# Patient Record
Sex: Female | Born: 1940 | Race: White | Hispanic: No | Marital: Single | Smoking: Former smoker
Health system: Southern US, Community
[De-identification: ages and names within clinical notes are randomized; demographics above are authoritative.]

## PROBLEM LIST (undated history)

## (undated) DIAGNOSIS — I482 Chronic atrial fibrillation, unspecified: Secondary | ICD-10-CM

## (undated) DIAGNOSIS — I5022 Chronic systolic (congestive) heart failure: Secondary | ICD-10-CM

## (undated) DIAGNOSIS — N183 Chronic kidney disease, stage 3 unspecified: Secondary | ICD-10-CM

## (undated) DIAGNOSIS — J189 Pneumonia, unspecified organism: Secondary | ICD-10-CM

## (undated) DIAGNOSIS — E119 Type 2 diabetes mellitus without complications: Secondary | ICD-10-CM

## (undated) DIAGNOSIS — I428 Other cardiomyopathies: Secondary | ICD-10-CM

## (undated) DIAGNOSIS — E0500 Thyrotoxicosis with diffuse goiter without thyrotoxic crisis or storm: Secondary | ICD-10-CM

## (undated) DIAGNOSIS — I119 Hypertensive heart disease without heart failure: Secondary | ICD-10-CM

## (undated) DIAGNOSIS — C9210 Chronic myeloid leukemia, BCR/ABL-positive, not having achieved remission: Secondary | ICD-10-CM

## (undated) DIAGNOSIS — I499 Cardiac arrhythmia, unspecified: Secondary | ICD-10-CM

## (undated) DIAGNOSIS — I4891 Unspecified atrial fibrillation: Secondary | ICD-10-CM

## (undated) DIAGNOSIS — I10 Essential (primary) hypertension: Secondary | ICD-10-CM

## (undated) DIAGNOSIS — E039 Hypothyroidism, unspecified: Secondary | ICD-10-CM

## (undated) DIAGNOSIS — D72829 Elevated white blood cell count, unspecified: Secondary | ICD-10-CM

## (undated) DIAGNOSIS — I739 Peripheral vascular disease, unspecified: Secondary | ICD-10-CM

## (undated) DIAGNOSIS — I701 Atherosclerosis of renal artery: Secondary | ICD-10-CM

## (undated) HISTORY — DX: Chronic atrial fibrillation, unspecified: I48.20

## (undated) HISTORY — DX: Chronic systolic (congestive) heart failure: I50.22

## (undated) HISTORY — DX: Chronic kidney disease, stage 3 unspecified: N18.30

## (undated) HISTORY — PX: HYSTEROTOMY: SHX1776

## (undated) HISTORY — DX: Hypertensive heart disease without heart failure: I11.9

## (undated) HISTORY — DX: Hypothyroidism, unspecified: E03.9

## (undated) HISTORY — DX: Type 2 diabetes mellitus without complications: E11.9

## (undated) HISTORY — DX: Chronic myeloid leukemia, BCR/ABL-positive, not having achieved remission: C92.10

## (undated) HISTORY — PX: CYST EXCISION: SHX5701

## (undated) HISTORY — DX: Thyrotoxicosis with diffuse goiter without thyrotoxic crisis or storm: E05.00

## (undated) HISTORY — DX: Essential (primary) hypertension: I10

## (undated) HISTORY — DX: Chronic kidney disease, stage 3 (moderate): N18.3

## (undated) HISTORY — PX: ABDOMINAL HYSTERECTOMY: SHX81

## (undated) HISTORY — PX: CYSTECTOMY: SUR359

## (undated) HISTORY — PX: CARDIOVERSION: SHX1299

## (undated) HISTORY — DX: Other cardiomyopathies: I42.8

---

## 2007-05-24 ENCOUNTER — Other Ambulatory Visit: Admission: RE | Admit: 2007-05-24 | Discharge: 2007-05-24 | Payer: Self-pay | Admitting: Family Medicine

## 2009-07-31 ENCOUNTER — Encounter: Admission: RE | Admit: 2009-07-31 | Discharge: 2009-07-31 | Payer: Self-pay | Admitting: Cardiology

## 2010-12-11 ENCOUNTER — Ambulatory Visit: Payer: Self-pay | Admitting: Cardiology

## 2011-04-08 ENCOUNTER — Encounter: Payer: Self-pay | Admitting: Cardiology

## 2011-04-23 ENCOUNTER — Telehealth: Payer: Self-pay | Admitting: Cardiology

## 2011-04-23 NOTE — Telephone Encounter (Signed)
WANTS TO SET UP APPOINTMENT SINCE DR. Doreatha Lew IS LEAVING. CAN SOMEONE CALL HER. DID NOT SEE CHART WITH WHOM DR. TENNANT IS REFERRING HER TOO.

## 2011-04-23 NOTE — Telephone Encounter (Signed)
Advised pt to see Sara Warner after tennants retirement

## 2011-12-15 ENCOUNTER — Institutional Professional Consult (permissible substitution): Payer: Self-pay | Admitting: Cardiology

## 2012-01-05 ENCOUNTER — Ambulatory Visit (INDEPENDENT_AMBULATORY_CARE_PROVIDER_SITE_OTHER): Payer: Medicare HMO | Admitting: Cardiology

## 2012-01-05 ENCOUNTER — Encounter: Payer: Self-pay | Admitting: Cardiology

## 2012-01-05 DIAGNOSIS — E039 Hypothyroidism, unspecified: Secondary | ICD-10-CM

## 2012-01-05 DIAGNOSIS — I4891 Unspecified atrial fibrillation: Secondary | ICD-10-CM

## 2012-01-05 DIAGNOSIS — I429 Cardiomyopathy, unspecified: Secondary | ICD-10-CM | POA: Insufficient documentation

## 2012-01-05 DIAGNOSIS — I428 Other cardiomyopathies: Secondary | ICD-10-CM

## 2012-01-05 NOTE — Progress Notes (Signed)
   HPI: 71 year old female previously followed by Dr. Doreatha Lew for followup of atrial fibrillation. She apparently has a permanent atrial fibrillation. She is not on Coumadin as she has had intermittent epistaxis with Coumadin in the past. Note a previous echocardiogram in 2008 showed an ejection fraction of 30-35%. Last echocardiogram in September of 2010 showed low normal LV function. There was mild left atrial enlargement. There was mild mitral regurgitation and tricuspid regurgitation. Myoview in February of 2009 showed no ischemia but mild global reduction in LV function with an ejection fraction of 47%. Since she was last seen in Jan 2012, the patient denies any dyspnea on exertion, orthopnea, PND, pedal edema, palpitations, syncope or chest pain.   Current Outpatient Prescriptions  Medication Sig Dispense Refill  . aspirin 325 MG EC tablet Take 325 mg by mouth daily.      . carvedilol (COREG) 6.25 MG tablet Take 6.25 mg by mouth 2 (two) times daily with a meal.      . clorazepate (TRANXENE) 7.5 MG tablet Take 7.5 mg by mouth daily as needed.      . digoxin (LANOXIN) 0.25 MG tablet Take 250 mcg by mouth daily.      . furosemide (LASIX) 20 MG tablet Take 20 mg by mouth daily.       . Levothyroxine Sodium (SYNTHROID PO) Take 125 mcg by mouth daily.       Marland Kitchen POTASSIUM PO Take 20 mcg by mouth daily.      . Calcium Carbonate (CALCIUM 600 PO) Take 600 mg by mouth 2 (two) times daily.      . Montelukast Sodium (SINGULAIR PO) Take by mouth daily.         Past Medical History  Diagnosis Date  . Arrhythmia     chronic atrial fib  . Toxic goiter   . Ejection fraction < 50%   . Hypothyroidism     Past Surgical History  Procedure Date  . Cystectomy   . Hysterotomy   . Cardioversion     History   Social History  . Marital Status: Single    Spouse Name: N/A    Number of Children: N/A  . Years of Education: N/A   Occupational History  . Not on file.   Social History Main Topics  .  Smoking status: Former Smoker    Quit date: 01/01/1984  . Smokeless tobacco: Not on file  . Alcohol Use:   . Drug Use:   . Sexually Active:    Other Topics Concern  . Not on file   Social History Narrative  . No narrative on file    ROS: no fevers or chills, productive cough, hemoptysis, dysphasia, odynophagia, melena, hematochezia, dysuria, hematuria, rash, seizure activity, orthopnea, PND, pedal edema, claudication. Remaining systems are negative.  Physical Exam: Well-developed well-nourished in no acute distress.  Skin is warm and dry.  HEENT is normal.  Neck is supple. No thyromegaly.  Chest is clear to auscultation with normal expansion.  Cardiovascular exam is irregular Abdominal exam nontender or distended. No masses palpated. Extremities show no edema. neuro grossly intact  ECG atrial fibrillation at a rate of 96. No significant ST changes.

## 2012-01-05 NOTE — Assessment & Plan Note (Addendum)
Continue Coreg and digoxin for rate control. Continue aspirin. She has embolic risk factors of female sex and age greater than 56. I reviewed Coumadin and reduced risk of CVA. However she has had nosebleeds with Coumadin in the past. For that reason she has declined that therapy. She understands risk of CVA. Will arrange 24 hour Holter monitor to make sure that rate is adequately controlled.

## 2012-01-05 NOTE — Assessment & Plan Note (Signed)
Management per primary care. 

## 2012-01-05 NOTE — Assessment & Plan Note (Signed)
Low normal to mildly reduced LV function on previous studies. Repeat echocardiogram.

## 2012-01-05 NOTE — Patient Instructions (Signed)
Your physician wants you to follow-up in: Sara Warner will receive a reminder letter in the mail two months in advance. If you don't receive a letter, please call our office to schedule the follow-up appointment.  Your physician has requested that you have an echocardiogram. Echocardiography is a painless test that uses sound waves to create images of your heart. It provides your doctor with information about the size and shape of your heart and how well your heart's chambers and valves are working. This procedure takes approximately one hour. There are no restrictions for this procedure.   Your physician has recommended that you wear a 24 HOUR holter monitor. Holter monitors are medical devices that record the heart's electrical activity. Doctors most often use these monitors to diagnose arrhythmias. Arrhythmias are problems with the speed or rhythm of the heartbeat. The monitor is a small, portable device. You can wear one while you do your normal daily activities. This is usually used to diagnose what is causing palpitations/syncope (passing out).

## 2012-01-12 ENCOUNTER — Encounter (INDEPENDENT_AMBULATORY_CARE_PROVIDER_SITE_OTHER): Payer: Medicare HMO

## 2012-01-12 ENCOUNTER — Other Ambulatory Visit: Payer: Self-pay

## 2012-01-12 ENCOUNTER — Ambulatory Visit (HOSPITAL_COMMUNITY): Payer: Medicare HMO | Attending: Cardiology | Admitting: Radiology

## 2012-01-12 DIAGNOSIS — I4891 Unspecified atrial fibrillation: Secondary | ICD-10-CM | POA: Insufficient documentation

## 2012-01-12 DIAGNOSIS — I428 Other cardiomyopathies: Secondary | ICD-10-CM | POA: Insufficient documentation

## 2012-01-12 DIAGNOSIS — E669 Obesity, unspecified: Secondary | ICD-10-CM | POA: Insufficient documentation

## 2012-01-15 ENCOUNTER — Telehealth: Payer: Self-pay | Admitting: *Deleted

## 2012-01-15 DIAGNOSIS — I4891 Unspecified atrial fibrillation: Secondary | ICD-10-CM

## 2012-01-15 MED ORDER — LISINOPRIL 2.5 MG PO TABS: 2.5000 mg | ORAL_TABLET | Freq: Every day | ORAL | Status: DC

## 2012-01-15 NOTE — Telephone Encounter (Signed)
Repeat labs:

## 2012-01-15 NOTE — Telephone Encounter (Signed)
Message copied by Cristopher Estimable on Fri Jan 15, 2012  4:29 PM ------      Message from: Lelon Perla      Created: Tue Jan 12, 2012  7:36 PM       Add lisinopril 2.5 mg po daily; bmet one week      Kirk Ruths

## 2012-01-22 ENCOUNTER — Telehealth: Payer: Self-pay | Admitting: *Deleted

## 2012-01-22 ENCOUNTER — Other Ambulatory Visit (INDEPENDENT_AMBULATORY_CARE_PROVIDER_SITE_OTHER): Payer: Medicare HMO

## 2012-01-22 DIAGNOSIS — I4891 Unspecified atrial fibrillation: Secondary | ICD-10-CM

## 2012-01-22 LAB — BASIC METABOLIC PANEL
BUN: 20 mg/dL (ref 6–23)
Calcium: 8.9 mg/dL (ref 8.4–10.5)
GFR: 73.22 mL/min (ref >60.00)
Glucose, Bld: 117 mg/dL — ABNORMAL HIGH (ref 70–99)

## 2012-01-22 MED ORDER — LISINOPRIL 2.5 MG PO TABS: 2.5000 mg | ORAL_TABLET | Freq: Every day | ORAL | Status: DC

## 2012-01-22 MED ORDER — CARVEDILOL 12.5 MG PO TABS: 12.5000 mg | ORAL_TABLET | Freq: Two times a day (BID) | ORAL | Status: DC

## 2012-01-22 NOTE — Telephone Encounter (Signed)
Spoke with pt, aware monitor reviewed by dr Stanford Breed shows a fib with PVC's or aberrantly conducted beats, rate increased. Pt instructed to inxcrease carvedilol to 12.5 mg bid.

## 2012-02-16 ENCOUNTER — Telehealth: Payer: Self-pay | Admitting: Cardiology

## 2012-02-16 DIAGNOSIS — I10 Essential (primary) hypertension: Secondary | ICD-10-CM

## 2012-02-16 NOTE — Telephone Encounter (Signed)
Spoke with pt, Aware of dr crenshaw's recommendations.  °

## 2012-02-16 NOTE — Telephone Encounter (Signed)
Dc lisinopril; check bmet Kirk Ruths

## 2012-02-16 NOTE — Telephone Encounter (Signed)
Spoke with pt, she is having a dry cough, muscle weakness and dark urine that she feels is coming from the lisinopril. She would like to change to a different medicine. Will forward for dr Stanford Breed review

## 2012-02-16 NOTE — Telephone Encounter (Signed)
New msg Pt said she has been having side effects from lisinopril. She wants to change back to lopressor. Please call

## 2012-02-17 ENCOUNTER — Other Ambulatory Visit (INDEPENDENT_AMBULATORY_CARE_PROVIDER_SITE_OTHER): Payer: Medicare HMO

## 2012-02-17 DIAGNOSIS — I10 Essential (primary) hypertension: Secondary | ICD-10-CM

## 2012-02-17 LAB — BASIC METABOLIC PANEL
Calcium: 8.9 mg/dL (ref 8.4–10.5)
Creatinine, Ser: 0.8 mg/dL (ref 0.4–1.2)
GFR: 76.42 mL/min (ref >60.00)
Sodium: 138 mEq/L (ref 135–145)

## 2012-05-12 ENCOUNTER — Telehealth: Payer: Self-pay | Admitting: *Deleted

## 2012-05-12 NOTE — Telephone Encounter (Signed)
New Pt Triage---New pt referral for elevated WBC with immature cells. Persistently elevating over past year, now up to 26K. Orders to scheduling.

## 2012-05-16 ENCOUNTER — Telehealth: Payer: Self-pay | Admitting: *Deleted

## 2012-05-16 NOTE — Telephone Encounter (Signed)
Called patient at [phone] patient confirmed over the phone the new date and time on 05-24-2012 starting at 10:00am with financial counseling

## 2012-05-23 ENCOUNTER — Other Ambulatory Visit: Payer: Self-pay | Admitting: Oncology

## 2012-05-23 ENCOUNTER — Telehealth: Payer: Self-pay | Admitting: Oncology

## 2012-05-23 DIAGNOSIS — D72829 Elevated white blood cell count, unspecified: Secondary | ICD-10-CM

## 2012-05-23 NOTE — Telephone Encounter (Signed)
Referred by Cyndi Bender, PA Dx- Elevated WBC

## 2012-05-24 ENCOUNTER — Other Ambulatory Visit (HOSPITAL_COMMUNITY)
Admission: RE | Admit: 2012-05-24 | Discharge: 2012-05-24 | Disposition: A | Discharge status: Home / Self Care | Payer: Medicare HMO | Source: Ambulatory Visit | Attending: Oncology | Admitting: Oncology

## 2012-05-24 ENCOUNTER — Other Ambulatory Visit (HOSPITAL_BASED_OUTPATIENT_CLINIC_OR_DEPARTMENT_OTHER): Payer: Medicare HMO | Admitting: Lab

## 2012-05-24 ENCOUNTER — Ambulatory Visit (HOSPITAL_BASED_OUTPATIENT_CLINIC_OR_DEPARTMENT_OTHER): Payer: Medicare HMO | Admitting: Oncology

## 2012-05-24 ENCOUNTER — Ambulatory Visit: Payer: Medicare HMO

## 2012-05-24 ENCOUNTER — Telehealth: Payer: Self-pay | Admitting: Oncology

## 2012-05-24 VITALS — BP 94/55 | HR 57 | Temp 97.0°F | Ht 68.0 in | Wt 216.3 lb

## 2012-05-24 DIAGNOSIS — D72829 Elevated white blood cell count, unspecified: Secondary | ICD-10-CM

## 2012-05-24 DIAGNOSIS — I4891 Unspecified atrial fibrillation: Secondary | ICD-10-CM

## 2012-05-24 LAB — CBC WITH DIFFERENTIAL/PLATELET
Eosinophils Absolute: 0.7 10*3/uL — ABNORMAL HIGH (ref 0.0–0.5)
HGB: 13.5 g/dL (ref 11.6–15.9)
MCV: 90.1 fL (ref 79.5–101.0)
MONO#: 0.6 10*3/uL (ref 0.1–0.9)
MONO%: 2 % (ref 0.0–14.0)
NEUT#: 21 10*3/uL — ABNORMAL HIGH (ref 1.5–6.5)
RBC: 4.68 10*6/uL (ref 3.70–5.45)
RDW: 14.5 % (ref 11.2–14.5)
WBC: 27.9 10*3/uL — ABNORMAL HIGH (ref 3.9–10.3)
lymph#: 4.3 10*3/uL — ABNORMAL HIGH (ref 0.9–3.3)

## 2012-05-24 LAB — COMPREHENSIVE METABOLIC PANEL
Albumin: 4.6 g/dL (ref 3.5–5.2)
Alkaline Phosphatase: 38 U/L — ABNORMAL LOW (ref 39–117)
Calcium: 9.4 mg/dL (ref 8.4–10.5)
Chloride: 104 mEq/L (ref 96–112)
Glucose, Bld: 124 mg/dL — ABNORMAL HIGH (ref 70–99)
Potassium: 4.7 mEq/L (ref 3.5–5.3)
Sodium: 139 mEq/L (ref 135–145)
Total Protein: 6.5 g/dL (ref 6.0–8.3)

## 2012-05-24 NOTE — Progress Notes (Signed)
Note dictated

## 2012-05-24 NOTE — Progress Notes (Signed)
CC:   Daiva Eves, MD  REASON FOR CONSULTATION:  Leukocytosis.  HISTORY OF PRESENT ILLNESS:  This is a pleasant 71 year old woman, native of [place], currently lives in [place], [place].  She is a pleasant woman with a past medical history significant for hypothyroidism and atrial fibrillation, followed by Dr. Doreatha Lew regarding that.  She gets her routine medical care as mentioned at [facility] and she has been noted to have leukocytosis on her last CBC.  On June 12 she had a white cell count of 25,000, hemoglobin was 14, platelet count was elevated at 542.  The differential showed elevated eosinophils and basophils but short on neutrophils predominantly and a left shift.  The differential showed some metamyelocytes and myelocytes as well.  Previously she had a CBC done in October of 2012, showed her white cell count was 12.3.  She had normal platelets and some absolute monocytosis and neutrophilia but otherwise really normal differential.  In May of 2012 she had a completely normal CBC.  For that reason, the patient was referred to me for evaluation for leukocytosis and thrombocytosis as well.  Clinically she is asymptomatic from this.  She does not report any fevers, does not report any chills, does not report any lymphadenopathy, has not had any weight loss, has not had any deterioration in her performance status.  Has not had any constitutional symptoms.  She does not report any back pain or bony pain.  She has not really reported any major changes in her activity level.  She does have some weak spells as she describes it, some fatigue at times but she still lives independently although she has a lot of family members that check on her on a regular basis.  She is able to drive and again attend to most activities of daily living without any hindrance or decline.  REVIEW OF SYSTEMS:  Not reporting headaches, blurry vision, double vision.  Not reporting any  motor or sensory neuropathy.  Not reporting any alteration of mental status.  Not reporting any psychiatric issues, depression.  Not reporting any fever, chills, sweats.  Not reporting any cough, hemoptysis, hematemesis, nausea, vomiting.  Not reporting any abdominal pain, hematochezia, melena or genitourinary complaints.  The rest of the review of systems is unremarkable.  PAST MEDICAL HISTORY:  Significant for atrial fibrillation.  She is rate controlled.  She has been on Coumadin in the past, developed epistaxis and most recently was prescribed Pradaxa which she has not started yet. She has also history of hypothyroidism, history of allergic rhinitis, hypertension, anxiety and peripheral neuropathy.  MEDICATIONS:  She is on Synthroid, carvedilol, clorazepate, Lasix, digoxin, multivitamin, calcium and aspirin.  SOCIAL HISTORY:  She is widowed.  She has 5 children, a number of grandchildren and great grandchildren.  Denied any alcohol or tobacco abuse for the time being.  She was working in [employer]. Her husband owned a Engineer, technical sales.  FAMILY HISTORY:  Her father is still alive.  He is 73 years old.  He has history of prostate cancer, had also lung cancer with resection, still reasonably in okay health.  Mother deceased, she had colon cancer. Otherwise no other history of any leukemias or blood disorders.  PHYSICAL EXAMINATION:  General:  Alert, awake, pleasant woman appeared in no active distress.  Vital signs:  Her blood pressure today is 94/55, pulse 57, respiration 20, temperature 97.  She weighs 216 pounds.  ECOG performance status is 1.  HEENT:  Head is normocephalic,  atraumatic. Pupils equal, round, reactive to light.  Oral mucosa moist and pink. Neck:  Supple without adenopathy.   Heart: irregular rhythm, controlled rate. No  murmurs, rubs, or gallops. Lungs: Clear to auscultation. No rhonchi,  wheezes, or dullness to percussion. Abdomen: Soft, nontender. No   hepatosplenomegaly. Extremities: No edema.  LABORATORY DATA: Hemoglobin of 13, white cell count of 27.9, platelet  count of 449. Differential showed 75% neutrophils. Basophils were  noted. Absolute basophilia and eosinophilia noted. Peripheral smear  was personally reviewed today and showed he had a left shift on the  smear including neutrophils, basophils, monocytes, metamyelocytes, very  few large or immature cells noted.  ASSESSMENT AND PLAN: 71 year old woman with the following issues.  1. Leukocytosis. Differential diagnosis discussed today with Mrs.  Costley which was divided today into a reactive versus a primary.  Reactive leukocytosis could be related to conditions such as  infection, stress, recent hospitalization and illnesses, medication  allergies. All of that, I think, are less likely in Mrs.  Renne. Really the history and the story, and the pattern of  her white cell elevation is not consistent with that. Primary  causes were discussed today in detail with Mrs. Ramesh. Those  would be related to a lymphoproliferative or a myeloproliferative  disorder. I think I favor here a myeloproliferative disorder in  the form of possibly a BCR-ABL positive chronic myelogenous  leukemia (CML); I think the most likely diagnosis. However, she  could have also a myeloproliferative disorder that is BCR-ABL  negative such as a myelofibrosis, essential thrombocythemia.  Polycythemia, I think, is less likely. I think she had a CEA and  normal hemoglobin at this time. I did also note some immature  white cells. However, I doubt that we are dealing with a  lymphoproliferative disorder and a leukemic process. To work this  up, I sent her peripheral blood for BCR-ABL to confirm the most  likely diagnosis of CML, but also I have sent her peripheral blood  for flow cytometry to rule out any lymphoproliferative disorder,  also to get an idea of her blast count to make sure she does not    have any acute process. I doubt that she has acute leukemia at  this point. The story and the peripheral smear all go against it.  Once we have established the diagnosis, I have asked her to come  back and we will discuss the treatment if indeed we are dealing  with CML. I talked to her briefly about the treatment options  including oral agents if she has in a BCR-ABL negative  myeloproliferative disorder and then we will discuss the treatment  options at that time. I also talked about the possible need for  bone marrow biopsy depending on the findings of the peripheral  blood analysis. All her questions were answered today. I think  overall she carries a good prognosis at this time as I do not  really see enlarged blasts in her peripheral smear to indicate  acute leukemic process. She is asymptomatic at this point and will  continue to be asymptomatic.  2. Atrial fibrillation. She is rate controlled with carvedilol. She  had been recently prescribed Pradaxa. I do not really see any  contraindication from my standpoint starting that.              ______________________________ Wyatt Portela, M.D. FNS/MEDQ  D:  05/24/2012  T:  05/24/2012  Job:  [id number]

## 2012-05-24 NOTE — Telephone Encounter (Signed)
gv pt appt schedule for July.  

## 2012-05-25 LAB — FLOW CYTOMETRY

## 2012-06-08 ENCOUNTER — Telehealth: Payer: Self-pay | Admitting: Oncology

## 2012-06-08 ENCOUNTER — Ambulatory Visit (HOSPITAL_BASED_OUTPATIENT_CLINIC_OR_DEPARTMENT_OTHER): Payer: Medicare HMO | Admitting: Oncology

## 2012-06-08 VITALS — BP 119/63 | HR 73 | Ht 68.0 in | Wt 215.5 lb

## 2012-06-08 DIAGNOSIS — D72829 Elevated white blood cell count, unspecified: Secondary | ICD-10-CM

## 2012-06-08 NOTE — Progress Notes (Signed)
Hematology and Oncology Follow Up Visit  Sara Warner [id number] 11-Sep-1941 71 y.o. 06/08/2012 4:26 PM   Principle Diagnosis: 71 year old woman with the leukocytosis. Differential diagnosis include: Reactive leukocytosis vs myeloproliferative disorder. Her BCR-ABL is negative.    Interim History:  Sara Warner presents today for a follow up visit. She is 71 year old with the above diagnosis presents today after I saw her for the first time on 05/24/2012. She is doing well and has no complaints. Clinically she is asymptomatic she does not report any fevers, does not report any chills,  does not report any lymphadenopathy, has not had any weight loss, has not had any deterioration in her performance status. Has not had any constitutional symptoms. She does not report any back pain or bony pain. She has not really reported any major changes in her activity level. She is able to drive and again attend to most activities of daily living without any hindrance or decline.   Medications: I have reviewed the patient's current medications. Current outpatient prescriptions:aspirin 325 MG EC tablet, Take 325 mg by mouth daily., Disp: , Rfl: ;  Calcium Carbonate (CALCIUM 600 PO), Take 600 mg by mouth 2 (two) times daily., Disp: , Rfl: ;  carvedilol (COREG) 12.5 MG tablet, Take 1 tablet (12.5 mg total) by mouth 2 (two) times daily with a meal., Disp: 180 tablet, Rfl: 4;  clorazepate (TRANXENE) 7.5 MG tablet, Take 7.5 mg by mouth daily as needed., Disp: , Rfl:  digoxin (LANOXIN) 0.25 MG tablet, Take 250 mcg by mouth daily., Disp: , Rfl: ;  furosemide (LASIX) 20 MG tablet, Take 20 mg by mouth daily. , Disp: , Rfl: ;  Levothyroxine Sodium (SYNTHROID PO), Take 125 mcg by mouth daily. , Disp: , Rfl: ;  POTASSIUM PO, Take 20 mcg by mouth daily., Disp: , Rfl: ;  DISCONTD: lisinopril (ZESTRIL) 2.5 MG tablet, Take 1 tablet (2.5 mg total) by mouth daily., Disp: 90 tablet, Rfl: 4  Allergies: No Known Allergies  Past  Medical History, Surgical history, Social history, and Family History were reviewed and updated.  Review of Systems: Constitutional:  Negative for fever, chills, night sweats, anorexia, weight loss, pain. Cardiovascular: no chest pain or dyspnea on exertion Respiratory: negative Neurological: negative Dermatological: negative ENT: negative Skin: Negative. Gastrointestinal: negative Genito-Urinary: negative Hematological and Lymphatic: negative Breast: negative Musculoskeletal: negative Remaining ROS negative. Physical Exam: Blood pressure 119/63, pulse 73, height 5\' 8"  (1.727 m), weight 215 lb 8 oz (97.75 kg). ECOG: 1 General appearance: alert Head: Normocephalic, without obvious abnormality, atraumatic Neck: no adenopathy, no carotid bruit, no JVD, supple, symmetrical, trachea midline and thyroid not enlarged, symmetric, no tenderness/mass/nodules Lymph nodes: Cervical, supraclavicular, and axillary nodes normal. Heart:irregularly irregular rhythm Lung:chest clear, no wheezing, rales, normal symmetric air entry Abdomin: soft, non-tender, without masses or organomegaly EXT:no erythema, induration, or nodules   Lab Results: Lab Results  Component Value Date   WBC 27.9* 05/24/2012   HGB 13.5 05/24/2012   HCT 42.2 05/24/2012   MCV 90.1 05/24/2012   PLT 449* 05/24/2012     Chemistry      Component Value Date/Time   NA 139 05/24/2012 1004   K 4.7 05/24/2012 1004   CL 104 05/24/2012 1004   CO2 26 05/24/2012 1004   BUN 17 05/24/2012 1004   CREATININE 0.82 05/24/2012 1004      Component Value Date/Time   CALCIUM 9.4 05/24/2012 1004   ALKPHOS 38* 05/24/2012 1004   AST 15 05/24/2012 1004   ALT 19  05/24/2012 1004   BILITOT 0.5 05/24/2012 1004        Impression and Plan:   This is a pleasant 71 year old Leukocytosis with the differential diagnosis include myeloproliferative disorder which include essential thrombocytosis, myelofibrosis and BCR-ABL negative CML.  I doubt this is a  lymphoproliferate disorder with a negative flow cytometry.  The plan is to observe her for a short period of time and if her counts continue to go up, I will do a bone marrow biopsy and consider starting hydroxyurea if indeed she has MPD.  I will also check her JAK-2 mutation with next lab draw.  For the time being, she is asymptomatic from this and her risk of thrombosis is very low.       Zola Button, MD 7/10/20134:26 PM

## 2012-06-08 NOTE — Telephone Encounter (Signed)
gv pt appt schedule for October.  °

## 2012-08-12 ENCOUNTER — Telehealth: Payer: Self-pay | Admitting: Cardiology

## 2012-08-12 ENCOUNTER — Other Ambulatory Visit: Payer: Self-pay | Admitting: Cardiology

## 2012-08-12 MED ORDER — CARVEDILOL 12.5 MG PO TABS: 12.5000 mg | ORAL_TABLET | Freq: Two times a day (BID) | ORAL | Status: DC

## 2012-08-12 MED ORDER — DIGOXIN 250 MCG PO TABS: 250.0000 ug | ORAL_TABLET | Freq: Every day | ORAL | Status: DC

## 2012-08-12 NOTE — Telephone Encounter (Signed)
Pt would like some called into [facility] in [place] so she can have enough til it comes from [facility] and she also needs that new Rx called in to [facility] asap

## 2012-08-12 NOTE — Telephone Encounter (Signed)
Spoke with pt, refills for coreg and digoxin sent to pharm

## 2012-08-12 NOTE — Telephone Encounter (Signed)
[facility] has changed the dose on her digoxin and she wants to discuss this

## 2012-08-15 ENCOUNTER — Telehealth: Payer: Self-pay | Admitting: Cardiology

## 2012-08-15 MED ORDER — CARVEDILOL 12.5 MG PO TABS: 12.5000 mg | ORAL_TABLET | Freq: Two times a day (BID) | ORAL | Status: DC

## 2012-08-15 NOTE — Telephone Encounter (Signed)
Pt needs 1 week's worth  refill at [facility] for  carvedilol , pt out and needs enough to last until mail order gets here

## 2012-09-07 ENCOUNTER — Other Ambulatory Visit: Payer: Medicare HMO | Admitting: Lab

## 2012-09-07 ENCOUNTER — Ambulatory Visit: Payer: Medicare HMO | Admitting: Oncology

## 2012-09-23 ENCOUNTER — Telehealth: Payer: Self-pay | Admitting: *Deleted

## 2012-09-23 NOTE — Telephone Encounter (Signed)
Gave patient appointment 10-11-2012 starting at 11:30am

## 2012-10-11 ENCOUNTER — Other Ambulatory Visit (HOSPITAL_BASED_OUTPATIENT_CLINIC_OR_DEPARTMENT_OTHER): Payer: Medicare HMO | Admitting: Lab

## 2012-10-11 ENCOUNTER — Ambulatory Visit (HOSPITAL_BASED_OUTPATIENT_CLINIC_OR_DEPARTMENT_OTHER): Payer: Medicare HMO | Admitting: Oncology

## 2012-10-11 ENCOUNTER — Telehealth: Payer: Self-pay | Admitting: Oncology

## 2012-10-11 VITALS — BP 109/62 | HR 91 | Temp 98.3°F | Resp 20 | Ht 68.0 in | Wt 218.7 lb

## 2012-10-11 DIAGNOSIS — D72829 Elevated white blood cell count, unspecified: Secondary | ICD-10-CM

## 2012-10-11 LAB — CBC WITH DIFFERENTIAL/PLATELET
BASO%: 0.3 % (ref 0.0–2.0)
HCT: 39.8 % (ref 34.8–46.6)
LYMPH%: 9.9 % — ABNORMAL LOW (ref 14.0–49.7)
MCHC: 33.3 g/dL (ref 31.5–36.0)
MONO#: 1.5 10*3/uL — ABNORMAL HIGH (ref 0.1–0.9)
NEUT%: 85.3 % — ABNORMAL HIGH (ref 38.4–76.8)
Platelets: 402 10*3/uL — ABNORMAL HIGH (ref 145–400)
WBC: 53.5 10*3/uL (ref 3.9–10.3)

## 2012-10-11 LAB — COMPREHENSIVE METABOLIC PANEL (CC13)
AST: 26 U/L (ref 5–34)
BUN: 17 mg/dL (ref 7.0–26.0)
CO2: 25 mEq/L (ref 22–29)
Calcium: 9.6 mg/dL (ref 8.4–10.4)
Chloride: 105 mEq/L (ref 98–107)
Creatinine: 0.9 mg/dL (ref 0.6–1.1)

## 2012-10-11 LAB — TECHNOLOGIST REVIEW

## 2012-10-11 NOTE — Telephone Encounter (Signed)
Gave pt appt for MD only, pt will be called for the CT Biopsy by Radiology

## 2012-10-11 NOTE — Progress Notes (Signed)
Hematology and Oncology Follow Up Visit  Genea Matyas [id number] October 03, 1941 71 y.o. 10/11/2012 12:17 PM   Principle Diagnosis: 71 year old woman with the leukocytosis. Differential diagnosis include: Reactive leukocytosis vs myeloproliferative disorder. Her BCR-ABL is negative.   Interim History:  Mrs.Liotta presents today for a follow up visit. She is 71 year old with the above diagnosis presents today after I saw her for the first time on 05/24/2012. She is doing well and has no complaints. Clinically she is asymptomatic she does not report any fevers, does not report any chills,  does not report any lymphadenopathy, has not had any weight loss, has not had any deterioration in her performance status. Has not had any constitutional symptoms. She does not report any back pain or bony pain. She has not really reported any major changes in her activity level. She is able to drive and again attend to most activities of daily living without any hindrance or decline.   Medications: I have reviewed the patient's current medications. Current outpatient prescriptions:aspirin 325 MG EC tablet, Take 325 mg by mouth daily., Disp: , Rfl: ;  Calcium Carbonate (CALCIUM 600 PO), Take 600 mg by mouth 2 (two) times daily., Disp: , Rfl: ;  carvedilol (COREG) 12.5 MG tablet, Take 1 tablet (12.5 mg total) by mouth 2 (two) times daily with a meal., Disp: 60 tablet, Rfl: 1 diazepam (VALIUM) 5 MG tablet, Take 5 mg by mouth every 6 (six) hours as needed. Patient takes 1/2 tablet as needed, Disp: , Rfl: ;  digoxin (LANOXIN) 0.25 MG tablet, Take 1 tablet (250 mcg total) by mouth daily., Disp: 90 tablet, Rfl: 4;  furosemide (LASIX) 20 MG tablet, Take 20 mg by mouth daily. , Disp: , Rfl: ;  Levothyroxine Sodium (SYNTHROID PO), Take 125 mcg by mouth daily. , Disp: , Rfl:  POTASSIUM PO, Take 20 mcg by mouth daily., Disp: , Rfl: ;  [DISCONTINUED] lisinopril (ZESTRIL) 2.5 MG tablet, Take 1 tablet (2.5 mg total) by mouth  daily., Disp: 90 tablet, Rfl: 4  Allergies: No Known Allergies  Past Medical History, Surgical history, Social history, and Family History were reviewed and updated.  Review of Systems: Constitutional:  Negative for fever, chills, night sweats, anorexia, weight loss, pain. Cardiovascular: no chest pain or dyspnea on exertion Respiratory: negative Neurological: negative Dermatological: negative ENT: negative Skin: Negative. Gastrointestinal: negative Genito-Urinary: negative Hematological and Lymphatic: negative Breast: negative Musculoskeletal: negative Remaining ROS negative. Physical Exam: Blood pressure 109/62, pulse 91, temperature 98.3 F (36.8 C), temperature source Oral, resp. rate 20, height 5\' 8"  (1.727 m), weight 218 lb 11.2 oz (99.202 kg). ECOG: 1 General appearance: alert Head: Normocephalic, without obvious abnormality, atraumatic Neck: no adenopathy, no carotid bruit, no JVD, supple, symmetrical, trachea midline and thyroid not enlarged, symmetric, no tenderness/mass/nodules Lymph nodes: Cervical, supraclavicular, and axillary nodes normal. Heart:irregularly irregular rhythm Lung:chest clear, no wheezing, rales, normal symmetric air entry Abdomin: soft, non-tender, without masses or organomegaly EXT:no erythema, induration, or nodules   Lab Results: Lab Results  Component Value Date   WBC 53.5* 10/11/2012   HGB 13.3 10/11/2012   HCT 39.8 10/11/2012   MCV 91.6 10/11/2012   PLT 402* 10/11/2012     Chemistry      Component Value Date/Time   NA 139 05/24/2012 1004   K 4.7 05/24/2012 1004   CL 104 05/24/2012 1004   CO2 26 05/24/2012 1004   BUN 17 05/24/2012 1004   CREATININE 0.82 05/24/2012 1004      Component Value Date/Time  CALCIUM 9.4 05/24/2012 1004   ALKPHOS 38* 05/24/2012 1004   AST 15 05/24/2012 1004   ALT 19 05/24/2012 1004   BILITOT 0.5 05/24/2012 1004     Smear reviewed: increased myelocytes and metamyelocytes. No blasts.  Impression and Plan:     This is a pleasant 71 year old Leukocytosis with the differential diagnosis include myeloproliferative disorder which include essential thrombocytosis, myelofibrosis and BCR-ABL negative CML.  I doubt this is a lymphoproliferate disorder with a negative flow cytometry.  Given her continued increase in her WBC, the plan is to do a bone marrow biopsy to rule out myeloproliferative disorder (BCR ABL negative CML, etc.). I will see her back after her biopsy.        [id number], MD 11/12/201312:17 PM

## 2012-10-20 ENCOUNTER — Other Ambulatory Visit: Payer: Self-pay | Admitting: Radiology

## 2012-10-24 ENCOUNTER — Encounter (HOSPITAL_COMMUNITY): Payer: Self-pay

## 2012-10-24 ENCOUNTER — Ambulatory Visit (HOSPITAL_COMMUNITY)
Admission: RE | Admit: 2012-10-24 | Discharge: 2012-10-24 | Disposition: A | Discharge status: Home / Self Care | Payer: Medicare HMO | Source: Ambulatory Visit | Attending: Oncology | Admitting: Oncology

## 2012-10-24 DIAGNOSIS — D72829 Elevated white blood cell count, unspecified: Secondary | ICD-10-CM

## 2012-10-24 HISTORY — DX: Elevated white blood cell count, unspecified: D72.829

## 2012-10-24 HISTORY — DX: Unspecified atrial fibrillation: I48.91

## 2012-10-24 LAB — CBC
HCT: 42.1 % (ref 36.0–46.0)
Hemoglobin: 13.9 g/dL (ref 12.0–15.0)
MCH: 30.2 pg (ref 26.0–34.0)
MCV: 91.3 fL (ref 78.0–100.0)
RBC: 4.61 MIL/uL (ref 3.87–5.11)

## 2012-10-24 LAB — PROTIME-INR: Prothrombin Time: 14 seconds (ref 11.6–15.2)

## 2012-10-24 MED ORDER — FENTANYL CITRATE 0.05 MG/ML IJ SOLN
INTRAMUSCULAR | Status: AC | PRN
  Administered 2012-10-24: 100 ug via INTRAVENOUS
  Administered 2012-10-24: 50 ug via INTRAVENOUS

## 2012-10-24 MED ORDER — MIDAZOLAM HCL 5 MG/5ML IJ SOLN
INTRAMUSCULAR | Status: AC | PRN
Start: 2012-10-24 — End: 2012-10-24
  Administered 2012-10-24: 1 mg via INTRAVENOUS

## 2012-10-24 MED ORDER — FENTANYL CITRATE 0.05 MG/ML IJ SOLN
INTRAMUSCULAR | Status: AC
  Filled 2012-10-24: qty 6

## 2012-10-24 MED ORDER — MIDAZOLAM HCL 2 MG/2ML IJ SOLN
INTRAMUSCULAR | Status: AC | PRN
  Administered 2012-10-24: 2 mg via INTRAVENOUS

## 2012-10-24 MED ORDER — MIDAZOLAM HCL 2 MG/2ML IJ SOLN
INTRAMUSCULAR | Status: AC
  Filled 2012-10-24: qty 6

## 2012-10-24 MED ORDER — SODIUM CHLORIDE 0.9 % IV SOLN
INTRAVENOUS | Status: DC
  Administered 2012-10-24: 08:00:00 via INTRAVENOUS

## 2012-10-24 NOTE — H&P (Signed)
Chief Complaint: "I'm here for a bone marrow biopsy" Referring Physician:Shadad HPI: Sara Warner is an 71 y.o. female with newly found leukocytosis of uncertain etiology. She is referred and scheduled for bone marrow biopsy. PMHx and meds reviewed.   Past Medical History:  Past Medical History  Diagnosis Date  . Arrhythmia     chronic atrial fib  . Toxic goiter   . Ejection fraction < 50%   . Hypothyroidism   . Atrial fibrillation   . CHF (congestive heart failure)   . Elevated WBC count     Past Surgical History:  Past Surgical History  Procedure Date  . Cystectomy   . Hysterotomy   . Cardioversion     Family History: No family history on file.  Social History:  reports that she quit smoking about 28 years ago. She does not have any smokeless tobacco history on file. Her alcohol and drug histories not on file.  Allergies: No Known Allergies  Medications: Current outpatient prescriptions:aspirin 325 MG EC tablet, Take 325 mg by mouth daily., Disp: , Rfl: ; Calcium Carbonate (CALCIUM 600 PO), Take 600 mg by mouth 2 (two) times daily., Disp: , Rfl: ; carvedilol (COREG) 12.5 MG tablet, Take 1 tablet (12.5 mg total) by mouth 2 (two) times daily with a meal., Disp: 60 tablet, Rfl: 1  diazepam (VALIUM) 5 MG tablet, Take 5 mg by mouth every 6 (six) hours as needed. Patient takes 1/2 tablet as needed, Disp: , Rfl: ; digoxin (LANOXIN) 0.25 MG tablet, Take 1 tablet (250 mcg total) by mouth daily., Disp: 90 tablet, Rfl: 4; furosemide (LASIX) 20 MG tablet, Take 20 mg by mouth daily. , Disp: , Rfl: ; Levothyroxine Sodium (SYNTHROID PO), Take 125 mcg by mouth daily. , Disp: , Rfl:  POTASSIUM PO, Take 20 mcg by mouth daily., Disp   Please HPI for pertinent positives, otherwise complete 10 system ROS negative.  Physical Exam: Blood pressure 139/71, pulse 82, temperature 98.1 F (36.7 C), resp. rate 18, height 5\' 8"  (1.727 m), weight 215 lb (97.523 kg), SpO2 98.00%. Body mass index is  32.69 kg/(m^2).   General Appearance:  Alert, cooperative, no distress, appears stated age  Head:  Normocephalic, without obvious abnormality, atraumatic  ENT: Unremarkable  Neck: Supple, symmetrical, trachea midline, no adenopathy, thyroid: not enlarged, symmetric, no tenderness/mass/nodules  Lungs:   Clear to auscultation bilaterally, no w/r/r, respirations unlabored without use of accessory muscles.  Chest Wall:  No tenderness or deformity  Heart:  Irregularly irregular c/w afib  Neurologic: Normal affect, no gross deficits.   Results for orders placed during the hospital encounter of 10/24/12 (from the past 48 hour(s))  APTT     Status: Normal   Collection Time   10/24/12  7:55 AM      Component Value Range Comment   aPTT 30  24 - 37 seconds   CBC     Status: Abnormal   Collection Time   10/24/12  7:55 AM      Component Value Range Comment   WBC 50.6 (*) 4.0 - 10.5 K/uL    RBC 4.61  3.87 - 5.11 MIL/uL    Hemoglobin 13.9  12.0 - 15.0 g/dL    HCT 42.1  36.0 - 46.0 %    MCV 91.3  78.0 - 100.0 fL    MCH 30.2  26.0 - 34.0 pg    MCHC 33.0  30.0 - 36.0 g/dL    RDW 15.7 (*) 11.5 - 15.5 %  Platelets 420 (*) 150 - 400 K/uL   PROTIME-INR     Status: Normal   Collection Time   10/24/12  7:55 AM      Component Value Range Comment   Prothrombin Time 14.0  11.6 - 15.2 seconds    INR 1.09  0.00 - 1.49    No results found.  Assessment/Plan Leukocytosis CT BM biopsy today Discussed procedure and risks Labs reviewed Consent signed in chart  Ascencion Dike PA-C 10/24/2012, 8:23 AM

## 2012-10-24 NOTE — Procedures (Signed)
Technically successful CT guided bone marrow aspiration and biopsy of left iliac crest. No immediate complications. Awaiting pathology report.    

## 2012-10-26 ENCOUNTER — Ambulatory Visit: Payer: Medicare HMO | Admitting: Oncology

## 2012-11-04 ENCOUNTER — Ambulatory Visit (HOSPITAL_BASED_OUTPATIENT_CLINIC_OR_DEPARTMENT_OTHER): Payer: Medicare HMO | Admitting: Oncology

## 2012-11-04 ENCOUNTER — Telehealth: Payer: Self-pay | Admitting: Oncology

## 2012-11-04 VITALS — BP 98/57 | HR 75 | Temp 97.2°F | Resp 20 | Ht 68.0 in | Wt 215.1 lb

## 2012-11-04 DIAGNOSIS — C9210 Chronic myeloid leukemia, BCR/ABL-positive, not having achieved remission: Secondary | ICD-10-CM

## 2012-11-04 DIAGNOSIS — D72829 Elevated white blood cell count, unspecified: Secondary | ICD-10-CM

## 2012-11-04 MED ORDER — IMATINIB MESYLATE 400 MG PO TABS: 400.0000 mg | ORAL_TABLET | Freq: Every day | ORAL | Status: DC

## 2012-11-04 NOTE — Progress Notes (Signed)
Script given to managed care for financial assistance for gleevec

## 2012-11-04 NOTE — Telephone Encounter (Signed)
gv and printed appt schedule for pt for jan 2014

## 2012-11-04 NOTE — Progress Notes (Signed)
Hematology and Oncology Follow Up Visit  Sara Warner [id number] May 07, 1941 70 y.o. 11/04/2012 12:51 PM   Principle Diagnosis: 70 year old woman with the leukocytosis. Differential diagnosis include myeloproliferative disorder. Her BCR-ABL is negative in the blood.   Interim History:  Sara Warner presents today for a follow up visit. She is 71 year old with the above diagnosis presents today after she completed a bone marrow biopsy on 11/25. She tolerated it without complications. She is doing well and has no complaints. Clinically she is asymptomatic she does not report any fevers, does not report any chills, does not report any lymphadenopathy, has not had any weight loss, has not had any deterioration in her performance status. Has not had any constitutional symptoms. She does not report any back pain or bony pain. She has not really reported any major changes in her activity level. She is able to drive and again attend to most activities of daily living without any hindrance or decline.   Medications: I have reviewed the patient's current medications. Current outpatient prescriptions:aspirin 325 MG EC tablet, Take 325 mg by mouth daily., Disp: , Rfl: ;  carvedilol (COREG) 12.5 MG tablet, Take 12.5 mg by mouth 2 (two) times daily with a meal., Disp: , Rfl: ;  digoxin (LANOXIN) 0.25 MG tablet, Take 250 mcg by mouth daily., Disp: , Rfl: ;  furosemide (LASIX) 20 MG tablet, Take 20 mg by mouth daily. , Disp: , Rfl:  imatinib (GLEEVEC) 400 MG tablet, Take 1 tablet (400 mg total) by mouth daily. Take with meals and large glass of water.Caution:Chemotherapy., Disp: 30 tablet, Rfl: 1;  Levothyroxine Sodium (SYNTHROID PO), Take 125 mcg by mouth daily. , Disp: , Rfl: ;  OVER THE COUNTER MEDICATION, Take 2 tablets by mouth 2 (two) times daily. Bee's wax, Disp: , Rfl: ;  potassium chloride SA (K-DUR,KLOR-CON) 20 MEQ tablet, Take 20 mEq by mouth daily., Disp: , Rfl:  [DISCONTINUED] lisinopril (ZESTRIL) 2.5  MG tablet, Take 1 tablet (2.5 mg total) by mouth daily., Disp: 90 tablet, Rfl: 4  Allergies: No Known Allergies  Past Medical History, Surgical history, Social history, and Family History were reviewed and updated.  Review of Systems: Constitutional:  Negative for fever, chills, night sweats, anorexia, weight loss, pain. Cardiovascular: no chest pain or dyspnea on exertion Respiratory: negative Neurological: negative Dermatological: negative ENT: negative Skin: Negative. Gastrointestinal: negative Genito-Urinary: negative Hematological and Lymphatic: negative Breast: negative Musculoskeletal: negative Remaining ROS negative. Physical Exam: Blood pressure 98/57, pulse 75, temperature 97.2 F (36.2 C), temperature source Oral, resp. rate 20, height 5\' 8"  (1.727 m), weight 215 lb 1.6 oz (97.569 kg). ECOG: 1 General appearance: alert Head: Normocephalic, without obvious abnormality, atraumatic Neck: no adenopathy, no carotid bruit, no JVD, supple, symmetrical, trachea midline and thyroid not enlarged, symmetric, no tenderness/mass/nodules Lymph nodes: Cervical, supraclavicular, and axillary nodes normal. Heart:irregularly irregular rhythm Lung:chest clear, no wheezing, rales, normal symmetric air entry Abdomin: soft, non-tender, without masses or organomegaly EXT:no erythema, induration, or nodules   Lab Results: Lab Results  Component Value Date   WBC 50.6* 10/24/2012   HGB 13.9 10/24/2012   HCT 42.1 10/24/2012   MCV 91.3 10/24/2012   PLT 420* 10/24/2012     Chemistry      Component Value Date/Time   NA 139 10/11/2012 1134   NA 139 05/24/2012 1004   K 4.6 10/11/2012 1134   K 4.7 05/24/2012 1004   CL 105 10/11/2012 1134   CL 104 05/24/2012 1004   CO2 25 10/11/2012 1134  CO2 26 05/24/2012 1004   BUN 17.0 10/11/2012 1134   BUN 17 05/24/2012 1004   CREATININE 0.9 10/11/2012 1134   CREATININE 0.82 05/24/2012 1004      Component Value Date/Time   CALCIUM 9.6 10/11/2012  1134   CALCIUM 9.4 05/24/2012 1004   ALKPHOS 50 10/11/2012 1134   ALKPHOS 38* 05/24/2012 1004   AST 26 10/11/2012 1134   AST 15 05/24/2012 1004   ALT 30 10/11/2012 1134   ALT 19 05/24/2012 1004   BILITOT 0.51 10/11/2012 1134   BILITOT 0.5 05/24/2012 1004     Bone marrow biopsy on 10/24/2012:  Diagnosis Note The morphologic features strongly favor a myeloproliferative neoplasm (chronic myeloproliferative disorder) particularly chronic myelogenous leukemia.  Impression and Plan:   This is a pleasant 71 year old Leukocytosis with the differential diagnosis include myeloproliferative disorder which include essential thrombocytosis, myelofibrosis and BCR-ABL negative CML.   Her Bone marrow biopsy results discusses today (BCR-ABL from the bone marrow is pending). This appears to be CML (possible the it is [place] chromosome negative)  I discussed the risks and benefits of Gleevec and she is agreeable to proceed. Complications include GI, skin, hematological were discussed. If she indeed PH negative, and has no response to [place], we will use hydroxyurea.   I will see her back in one month.      [id number], MD 12/6/201312:51 PM

## 2012-11-09 ENCOUNTER — Encounter: Payer: Self-pay | Admitting: Oncology

## 2012-11-09 NOTE — Progress Notes (Addendum)
Gave RX for Gleevec to [employer] from [place]. [phone] phone number.  11/11/12 On phone for over 45 minutes and spoke with Vida Rigger and JR correct phone is [phone]. Received a fax which was completed and faxed back to [phone].  11/11/12 [employer] approved the [place]. Spoke to [place] and she has a 2500.00 copay. He will check for assistance.  11/14/12 No assistance for CML Gleevec.  11/16/12 Spoke to Shanon Brow and Dr.  Alen Blew changed it to Tasigna so she could get 3 months free.

## 2012-11-09 NOTE — Progress Notes (Signed)
Incoming fax from [place] requesting Prior Auth on [place] given to Care Management.

## 2012-11-11 ENCOUNTER — Encounter: Payer: Self-pay | Admitting: *Deleted

## 2012-11-11 NOTE — Progress Notes (Signed)
RECEIVED A FAX FROM [facility] CONCERNING A PRIOR AUTHORIZATION FOR GLEEVEC. THIS REQUEST WAS GIVEN TO ELIZABETH SUTTON IN MANAGED CARE.

## 2012-11-14 ENCOUNTER — Encounter: Payer: Self-pay | Admitting: Oncology

## 2012-11-14 NOTE — Progress Notes (Unsigned)
11/14/2012   I spoke with this patient RE:  [employer] [place], patient will get forms to be filled out and pathology report and I will fax to insurance company.  Patient voiced understanding and will bring forms in to be completed.  BX:[id number]

## 2012-11-16 ENCOUNTER — Other Ambulatory Visit: Payer: Self-pay | Admitting: Oncology

## 2012-11-16 ENCOUNTER — Telehealth: Payer: Self-pay | Admitting: *Deleted

## 2012-11-16 DIAGNOSIS — C9210 Chronic myeloid leukemia, BCR/ABL-positive, not having achieved remission: Secondary | ICD-10-CM

## 2012-11-16 MED ORDER — NILOTINIB HCL 150 MG PO CAPS: 300.0000 mg | ORAL_CAPSULE | Freq: Two times a day (BID) | ORAL | Status: DC

## 2012-11-16 NOTE — Telephone Encounter (Signed)
Patient wanted to know if her medicine was here yet? Explained to her that [facility] in managed care was busy looking for specialty pharmacies that have available monies for chemotherapy patients. We will call her when we find a source.

## 2012-12-07 ENCOUNTER — Telehealth: Payer: Self-pay | Admitting: Oncology

## 2012-12-07 ENCOUNTER — Other Ambulatory Visit (HOSPITAL_BASED_OUTPATIENT_CLINIC_OR_DEPARTMENT_OTHER): Payer: Medicare HMO | Admitting: Lab

## 2012-12-07 ENCOUNTER — Ambulatory Visit (HOSPITAL_BASED_OUTPATIENT_CLINIC_OR_DEPARTMENT_OTHER): Payer: Medicare HMO | Admitting: Oncology

## 2012-12-07 VITALS — BP 119/70 | HR 68 | Temp 97.2°F | Resp 20 | Ht 68.0 in | Wt 215.3 lb

## 2012-12-07 DIAGNOSIS — E039 Hypothyroidism, unspecified: Secondary | ICD-10-CM

## 2012-12-07 DIAGNOSIS — C9210 Chronic myeloid leukemia, BCR/ABL-positive, not having achieved remission: Secondary | ICD-10-CM

## 2012-12-07 DIAGNOSIS — F411 Generalized anxiety disorder: Secondary | ICD-10-CM

## 2012-12-07 DIAGNOSIS — D72829 Elevated white blood cell count, unspecified: Secondary | ICD-10-CM

## 2012-12-07 LAB — COMPREHENSIVE METABOLIC PANEL (CC13)
Albumin: 3.7 g/dL (ref 3.5–5.0)
Alkaline Phosphatase: 48 U/L (ref 40–150)
BUN: 16 mg/dL (ref 7.0–26.0)
Creatinine: 1 mg/dL (ref 0.6–1.1)
Glucose: 179 mg/dl — ABNORMAL HIGH (ref 70–99)
Potassium: 4.2 mEq/L (ref 3.5–5.1)

## 2012-12-07 LAB — CBC WITH DIFFERENTIAL/PLATELET
Basophils Absolute: 1.8 10*3/uL — ABNORMAL HIGH (ref 0.0–0.1)
Eosinophils Absolute: 1 10*3/uL — ABNORMAL HIGH (ref 0.0–0.5)
HCT: 39.8 % (ref 34.8–46.6)
HGB: 12.8 g/dL (ref 11.6–15.9)
LYMPH%: 7.9 % — ABNORMAL LOW (ref 14.0–49.7)
MCH: 29.8 pg (ref 25.1–34.0)
MCV: 92.7 fL (ref 79.5–101.0)
MONO%: 2.2 % (ref 0.0–14.0)
NEUT#: 49.4 10*3/uL — ABNORMAL HIGH (ref 1.5–6.5)
NEUT%: 85 % — ABNORMAL HIGH (ref 38.4–76.8)
Platelets: 354 10*3/uL (ref 145–400)
RDW: 15.3 % — ABNORMAL HIGH (ref 11.2–14.5)

## 2012-12-07 LAB — TSH: TSH: 3.063 u[IU]/mL (ref 0.350–4.500)

## 2012-12-07 MED ORDER — LORAZEPAM 1 MG PO TABS: 1.0000 mg | ORAL_TABLET | Freq: Three times a day (TID) | ORAL | Status: DC

## 2012-12-07 MED ORDER — ALPRAZOLAM 0.5 MG PO TABS: 0.5000 mg | ORAL_TABLET | Freq: Every evening | ORAL | Status: DC | PRN

## 2012-12-07 NOTE — Progress Notes (Signed)
Hematology and Oncology Follow Up Visit  Sara Warner [id number] 26-Dec-1940 72 y.o. 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 Diagnosed a bone marrow biopsy on 10/24/2012.   Current treatment: She is to start Tasigna as soon as she gets it.   Interim History:  Sara Warner presents today for a follow up visit. She is 72 year old with the above diagnosis presents today  Clinically she is asymptomatic she does not report any fevers, does not report any chills, does not report any lymphadenopathy, has not had any weight loss, has not had any deterioration in her performance status. Has not had any constitutional symptoms. She does not report any back pain or bony pain. She has not really reported any major changes in her activity level. She is able to drive and again attend to most activities of daily living without any hindrance or decline. She was not able to get Gleevec but was she qualified for 3 month supply of Tasigna. She has not started it yet.    Medications: I have reviewed the patient's current medications. Current outpatient prescriptions:aspirin 325 MG EC tablet, Take 325 mg by mouth daily., Disp: , Rfl: ;  carvedilol (COREG) 12.5 MG tablet, Take 12.5 mg by mouth 2 (two) times daily with a meal., Disp: , Rfl: ;  digoxin (LANOXIN) 0.25 MG tablet, Take 250 mcg by mouth daily., Disp: , Rfl: ;  furosemide (LASIX) 20 MG tablet, Take 20 mg by mouth daily. , Disp: , Rfl: ;  Levothyroxine Sodium (SYNTHROID PO), Take 125 mcg by mouth daily. , Disp: , Rfl:  LORazepam (ATIVAN) 1 MG tablet, Take 1 tablet (1 mg total) by mouth every 8 (eight) hours., Disp: 30 tablet, Rfl: 0;  nilotinib (TASIGNA) 150 MG capsule, Take 2 capsules (300 mg total) by mouth every 12 (twelve) hours., Disp: 120 capsule, Rfl: 0;  OVER THE COUNTER MEDICATION, Take 2 tablets by mouth 2 (two) times daily. Bee's wax, Disp: , Rfl: ;  potassium chloride SA  (K-DUR,KLOR-CON) 20 MEQ tablet, Take 20 mEq by mouth daily., Disp: , Rfl:  [DISCONTINUED] lisinopril (ZESTRIL) 2.5 MG tablet, Take 1 tablet (2.5 mg total) by mouth daily., Disp: 90 tablet, Rfl: 4  Allergies: No Known Allergies  Past Medical History, Surgical history, Social history, and Family History were reviewed and updated.  Review of Systems: Constitutional:  Negative for fever, chills, night sweats, anorexia, weight loss, pain. Cardiovascular: no chest pain or dyspnea on exertion Respiratory: negative Neurological: negative Dermatological: negative ENT: negative Skin: Negative. Gastrointestinal: negative Genito-Urinary: negative Hematological and Lymphatic: negative Breast: negative Musculoskeletal: negative Remaining ROS negative. Physical Exam: Blood pressure 119/70, pulse 68, temperature 97.2 F (36.2 C), temperature source Oral, resp. rate 20, height 5\' 8"  (1.727 m), weight 215 lb 4.8 oz (97.659 kg). ECOG: 1 General appearance: alert Head: Normocephalic, without obvious abnormality, atraumatic Neck: no adenopathy, no carotid bruit, no JVD, supple, symmetrical, trachea midline and thyroid not enlarged, symmetric, no tenderness/mass/nodules Lymph nodes: Cervical, supraclavicular, and axillary nodes normal. Heart:irregularly irregular rhythm Lung:chest clear, no wheezing, rales, normal symmetric air entry Abdomin: soft, non-tender, without masses or organomegaly EXT:no erythema, induration, or nodules   Lab Results: Lab Results  Component Value Date   WBC 58.1* 12/07/2012   HGB 12.8 12/07/2012   HCT 39.8 12/07/2012   MCV 92.7 12/07/2012   PLT 354 12/07/2012     Chemistry      Component Value Date/Time   NA 139  10/11/2012 1134   NA 139 05/24/2012 1004   K 4.6 10/11/2012 1134   K 4.7 05/24/2012 1004   CL 105 10/11/2012 1134   CL 104 05/24/2012 1004   CO2 25 10/11/2012 1134   CO2 26 05/24/2012 1004   BUN 17.0 10/11/2012 1134   BUN 17 05/24/2012 1004   CREATININE 0.9  10/11/2012 1134   CREATININE 0.82 05/24/2012 1004      Component Value Date/Time   CALCIUM 9.6 10/11/2012 1134   CALCIUM 9.4 05/24/2012 1004   ALKPHOS 50 10/11/2012 1134   ALKPHOS 38* 05/24/2012 1004   AST 26 10/11/2012 1134   AST 15 05/24/2012 1004   ALT 30 10/11/2012 1134   ALT 19 05/24/2012 1004   BILITOT 0.51 10/11/2012 1134   BILITOT 0.5 05/24/2012 1004     Bone marrow biopsy on 10/24/2012:  Diagnosis Note The morphologic features strongly favor a myeloproliferative neoplasm (chronic myeloproliferative disorder) particularly chronic myelogenous leukemia.  Impression and Plan:   This is a pleasant 72 year old with: 1. Leukocytosis. She was found to have BCR-ABL positive CML. She is to start [place] soon. Risks and benefits discussed today.  We will evaluate her in one month for possible side effects.   2. Hypothyroidism: I will check TSH today.   3. Anxiety: Ativan Rx today.      [facility], MD 1/8/201410:18 AM

## 2012-12-07 NOTE — Telephone Encounter (Signed)
Gave pt appt calendar for lab and ML on 2/11

## 2012-12-08 NOTE — Progress Notes (Signed)
Faxed labs done on 12/07/12 to patient's pcp, dr Lisbeth Ply [phone]

## 2012-12-29 ENCOUNTER — Other Ambulatory Visit: Payer: Self-pay | Admitting: *Deleted

## 2012-12-29 ENCOUNTER — Encounter: Payer: Self-pay | Admitting: *Deleted

## 2012-12-29 DIAGNOSIS — C9210 Chronic myeloid leukemia, BCR/ABL-positive, not having achieved remission: Secondary | ICD-10-CM

## 2012-12-29 MED ORDER — NILOTINIB HCL 150 MG PO CAPS: 300.0000 mg | ORAL_CAPSULE | Freq: Two times a day (BID) | ORAL | Status: DC

## 2012-12-29 NOTE — Progress Notes (Unsigned)
Signed refill script faxed to [facility] for [phone] Sara Warner

## 2013-01-10 ENCOUNTER — Other Ambulatory Visit (HOSPITAL_BASED_OUTPATIENT_CLINIC_OR_DEPARTMENT_OTHER): Payer: Medicare HMO | Admitting: Lab

## 2013-01-10 ENCOUNTER — Ambulatory Visit (HOSPITAL_BASED_OUTPATIENT_CLINIC_OR_DEPARTMENT_OTHER): Payer: Medicare HMO | Admitting: Oncology

## 2013-01-10 ENCOUNTER — Telehealth: Payer: Self-pay | Admitting: Oncology

## 2013-01-10 ENCOUNTER — Encounter: Payer: Self-pay | Admitting: Oncology

## 2013-01-10 VITALS — BP 135/72 | HR 71 | Temp 97.9°F | Resp 20 | Ht 68.0 in | Wt 205.7 lb

## 2013-01-10 DIAGNOSIS — C9210 Chronic myeloid leukemia, BCR/ABL-positive, not having achieved remission: Secondary | ICD-10-CM

## 2013-01-10 DIAGNOSIS — E039 Hypothyroidism, unspecified: Secondary | ICD-10-CM

## 2013-01-10 DIAGNOSIS — F411 Generalized anxiety disorder: Secondary | ICD-10-CM

## 2013-01-10 HISTORY — DX: Chronic myeloid leukemia, BCR/ABL-positive, not having achieved remission: C92.10

## 2013-01-10 LAB — COMPREHENSIVE METABOLIC PANEL (CC13)
ALT: 19 U/L (ref 0–55)
Albumin: 3.5 g/dL (ref 3.5–5.0)
CO2: 28 mEq/L (ref 22–29)
Calcium: 9 mg/dL (ref 8.4–10.4)
Chloride: 103 mEq/L (ref 98–107)
Potassium: 4.1 mEq/L (ref 3.5–5.1)
Sodium: 138 mEq/L (ref 136–145)
Total Bilirubin: 1.23 mg/dL — ABNORMAL HIGH (ref 0.20–1.20)
Total Protein: 6.6 g/dL (ref 6.4–8.3)

## 2013-01-10 LAB — CBC WITH DIFFERENTIAL/PLATELET
BASO%: 2.7 % — ABNORMAL HIGH (ref 0.0–2.0)
MCHC: 32.9 g/dL (ref 31.5–36.0)
MONO#: 0.4 10*3/uL (ref 0.1–0.9)
NEUT#: 7.1 10*3/uL — ABNORMAL HIGH (ref 1.5–6.5)
RBC: 4.58 10*6/uL (ref 3.70–5.45)
RDW: 14.7 % — ABNORMAL HIGH (ref 11.2–14.5)
WBC: 10.6 10*3/uL — ABNORMAL HIGH (ref 3.9–10.3)
lymph#: 2.5 10*3/uL (ref 0.9–3.3)

## 2013-01-10 MED ORDER — CLORAZEPATE DIPOTASSIUM 7.5 MG PO TABS: 7.5000 mg | ORAL_TABLET | Freq: Two times a day (BID) | ORAL | Status: AC | PRN

## 2013-01-10 NOTE — Progress Notes (Signed)
Hematology and Oncology Follow Up Visit  Naveya Peshek [id number] May 30, 1941 72 y.o. 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 Diagnosed a bone marrow biopsy on 10/24/2012.   Current treatment: Tasigna 300 mg daily.   Interim History:  Mrs.Quinnell presents today for a follow up visit. She is a 72 year old with the above diagnosis presents today for routine follow-up with her daughter. Clinically she is asymptomatic she does not report any fevers, does not report any chills, does not report any lymphadenopathy. Has mild fatigue, but has not had any deterioration in her performance status. Has not had any constitutional symptoms. She does not report any back pain or bony pain. She has not really reported any major changes in her activity level. She is able to drive and again attend to most activities of daily living without any hindrance or decline.  Medications: I have reviewed the patient's current medications. Current outpatient prescriptions:aspirin 325 MG EC tablet, Take 325 mg by mouth daily., Disp: , Rfl: ;  carvedilol (COREG) 12.5 MG tablet, Take 12.5 mg by mouth 2 (two) times daily with a meal., Disp: , Rfl: ;  clorazepate (TRANXENE-T) 7.5 MG tablet, Take 1 tablet (7.5 mg total) by mouth 2 (two) times daily as needed for anxiety., Disp: 30 tablet, Rfl: 1;  digoxin (LANOXIN) 0.25 MG tablet, Take 250 mcg by mouth daily., Disp: , Rfl:  furosemide (LASIX) 20 MG tablet, Take 20 mg by mouth daily. , Disp: , Rfl: ;  Levothyroxine Sodium (SYNTHROID PO), Take 125 mcg by mouth daily. , Disp: , Rfl: ;  nilotinib (TASIGNA) 150 MG capsule, Take 2 capsules (300 mg total) by mouth every 12 (twelve) hours., Disp: 112 capsule, Rfl: 0;  OVER THE COUNTER MEDICATION, Take 2 tablets by mouth 2 (two) times daily. Bee's wax, Disp: , Rfl:  potassium chloride SA (K-DUR,KLOR-CON) 20 MEQ tablet, Take 20 mEq by mouth daily., Disp: , Rfl: ;   [DISCONTINUED] lisinopril (ZESTRIL) 2.5 MG tablet, Take 1 tablet (2.5 mg total) by mouth daily., Disp: 90 tablet, Rfl: 4  Allergies: No Known Allergies  Past Medical History, Surgical history, Social history, and Family History were reviewed and updated.  Review of Systems: Constitutional:  Negative for fever, chills, night sweats, anorexia, weight loss, pain. Cardiovascular: no chest pain or dyspnea on exertion Respiratory: negative Neurological: negative Dermatological: negative ENT: negative Skin: Negative. Gastrointestinal: negative Genito-Urinary: negative Hematological and Lymphatic: negative Breast: negative Musculoskeletal: negative Remaining ROS negative.  Physical Exam: Blood pressure 135/72, pulse 71, temperature 97.9 F (36.6 C), temperature source Oral, resp. rate 20, height 5\' 8"  (1.727 m), weight 205 lb 11.2 oz (93.305 kg). ECOG: 1 General appearance: alert Head: Normocephalic, without obvious abnormality, atraumatic Neck: no adenopathy, no carotid bruit, no JVD, supple, symmetrical, trachea midline and thyroid not enlarged, symmetric, no tenderness/mass/nodules Lymph nodes: Cervical, supraclavicular, and axillary nodes normal. Heart:irregularly irregular rhythm Lung:chest clear, no wheezing, rales, normal symmetric air entry Abdomen: soft, non-tender, without masses or organomegaly EXT:no erythema, induration, or nodules   Lab Results: Lab Results  Component Value Date   WBC 10.6* 01/10/2013   HGB 13.8 01/10/2013   HCT 42.0 01/10/2013   MCV 91.8 01/10/2013   PLT 286 01/10/2013     Chemistry      Component Value Date/Time   NA 138 01/10/2013 1122   NA 139 05/24/2012 1004   K 4.1 01/10/2013 1122   K 4.7 05/24/2012 1004   CL  103 01/10/2013 1122   CL 104 05/24/2012 1004   CO2 28 01/10/2013 1122   CO2 26 05/24/2012 1004   BUN 19.1 01/10/2013 1122   BUN 17 05/24/2012 1004   CREATININE 0.9 01/10/2013 1122   CREATININE 0.82 05/24/2012 1004      Component Value  Date/Time   CALCIUM 9.0 01/10/2013 1122   CALCIUM 9.4 05/24/2012 1004   ALKPHOS 55 01/10/2013 1122   ALKPHOS 38* 05/24/2012 1004   AST 9 01/10/2013 1122   AST 15 05/24/2012 1004   ALT 19 01/10/2013 1122   ALT 19 05/24/2012 1004   BILITOT 1.23* 01/10/2013 1122   BILITOT 0.5 05/24/2012 1004     Impression and Plan:   This is a pleasant 72 year old with: 1. CML. On Tasigna with a significant drop in her WBC. She is tolerating this medication well. Recommend that she proceed with Tasigna without dose modification.   2. Hypothyroidism: On Synthroid. TSH was normal on 12/07/12.   3. Anxiety: She has received Ativan, but states this did not help. She has been on Tranxene before and has requested this again. I have filled this for her.    4. Follow-up: In 1 month    [place], [place] 2/11/201412:48 PM

## 2013-01-10 NOTE — Telephone Encounter (Signed)
gv and printed appt schedule for pt for march

## 2013-01-26 ENCOUNTER — Other Ambulatory Visit: Payer: Self-pay | Admitting: Oncology

## 2013-01-27 ENCOUNTER — Other Ambulatory Visit: Payer: Self-pay | Admitting: *Deleted

## 2013-01-27 DIAGNOSIS — C9210 Chronic myeloid leukemia, BCR/ABL-positive, not having achieved remission: Secondary | ICD-10-CM

## 2013-01-27 MED ORDER — NILOTINIB HCL 150 MG PO CAPS: 300.0000 mg | ORAL_CAPSULE | Freq: Two times a day (BID) | ORAL | Status: DC

## 2013-02-09 ENCOUNTER — Telehealth: Payer: Self-pay | Admitting: Oncology

## 2013-02-09 ENCOUNTER — Ambulatory Visit (HOSPITAL_BASED_OUTPATIENT_CLINIC_OR_DEPARTMENT_OTHER): Payer: Medicare HMO | Admitting: Oncology

## 2013-02-09 ENCOUNTER — Other Ambulatory Visit (HOSPITAL_BASED_OUTPATIENT_CLINIC_OR_DEPARTMENT_OTHER): Payer: Medicare HMO | Admitting: Lab

## 2013-02-09 VITALS — BP 107/53 | HR 72 | Temp 97.3°F | Resp 20 | Ht 68.0 in | Wt 208.3 lb

## 2013-02-09 DIAGNOSIS — F411 Generalized anxiety disorder: Secondary | ICD-10-CM

## 2013-02-09 DIAGNOSIS — E039 Hypothyroidism, unspecified: Secondary | ICD-10-CM

## 2013-02-09 DIAGNOSIS — C9210 Chronic myeloid leukemia, BCR/ABL-positive, not having achieved remission: Secondary | ICD-10-CM

## 2013-02-09 LAB — CBC WITH DIFFERENTIAL/PLATELET
BASO%: 2.4 % — ABNORMAL HIGH (ref 0.0–2.0)
Basophils Absolute: 0.2 10e3/uL — ABNORMAL HIGH (ref 0.0–0.1)
EOS%: 3.4 % (ref 0.0–7.0)
Eosinophils Absolute: 0.3 10e3/uL (ref 0.0–0.5)
HCT: 40.9 % (ref 34.8–46.6)
HGB: 13.7 g/dL (ref 11.6–15.9)
LYMPH%: 30.7 % (ref 14.0–49.7)
MCH: 30 pg (ref 25.1–34.0)
MCHC: 33.3 g/dL (ref 31.5–36.0)
MCV: 89.9 fL (ref 79.5–101.0)
MONO#: 0.7 10e3/uL (ref 0.1–0.9)
MONO%: 6.9 % (ref 0.0–14.0)
NEUT#: 5.3 10e3/uL (ref 1.5–6.5)
NEUT%: 56.6 % (ref 38.4–76.8)
Platelets: 292 10e3/uL (ref 145–400)
RBC: 4.55 10e6/uL (ref 3.70–5.45)
RDW: 13.4 % (ref 11.2–14.5)
WBC: 9.4 10e3/uL (ref 3.9–10.3)
lymph#: 2.9 10e3/uL (ref 0.9–3.3)

## 2013-02-09 LAB — COMPREHENSIVE METABOLIC PANEL (CC13)
ALT: 21 U/L (ref 0–55)
AST: 13 U/L (ref 5–34)
Albumin: 3.8 g/dL (ref 3.5–5.0)
Alkaline Phosphatase: 60 U/L (ref 40–150)
BUN: 12.4 mg/dL (ref 7.0–26.0)
CO2: 27 meq/L (ref 22–29)
Calcium: 9.1 mg/dL (ref 8.4–10.4)
Chloride: 105 meq/L (ref 98–107)
Creatinine: 0.9 mg/dL (ref 0.6–1.1)
Glucose: 178 mg/dL — ABNORMAL HIGH (ref 70–99)
Potassium: 3.8 meq/L (ref 3.5–5.1)
Sodium: 142 meq/L (ref 136–145)
Total Bilirubin: 0.91 mg/dL (ref 0.20–1.20)
Total Protein: 6.6 g/dL (ref 6.4–8.3)

## 2013-02-09 NOTE — Progress Notes (Signed)
Hematology and Oncology Follow Up Visit  Sara Warner [id number] 1941/01/26 72 y.o. 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 Diagnosed a bone marrow biopsy on 10/24/2012.   Current treatment: Tasigna 300 mg daily started 11/2012.   Interim History:  Sara Warner presents today for a follow up visit. She is a 72 year old with the above diagnosis presents today for routine follow-up with her daughter. She continues to do well with Tasigna without complications. Clinically she is asymptomatic she does not report any fevers, does not report any chills, does not report any lymphadenopathy. Has mild fatigue, but has not had any deterioration in her performance status. Has not had any constitutional symptoms. She does not report any back pain or bony pain. She has not really reported any major changes in her activity level. She is able to drive and again attend to most activities of daily living without any hindrance or decline.  Medications: I have reviewed the patient's current medications. Current outpatient prescriptions:aspirin 325 MG EC tablet, Take 325 mg by mouth daily., Disp: , Rfl: ;  carvedilol (COREG) 12.5 MG tablet, Take 12.5 mg by mouth 2 (two) times daily with a meal., Disp: , Rfl: ;  clorazepate (TRANXENE-T) 7.5 MG tablet, Take 1 tablet (7.5 mg total) by mouth 2 (two) times daily as needed for anxiety., Disp: 30 tablet, Rfl: 1;  digoxin (LANOXIN) 0.25 MG tablet, Take 250 mcg by mouth daily., Disp: , Rfl:  furosemide (LASIX) 20 MG tablet, Take 20 mg by mouth daily. , Disp: , Rfl: ;  Levothyroxine Sodium (SYNTHROID PO), Take 125 mcg by mouth daily. , Disp: , Rfl: ;  nilotinib (TASIGNA) 150 MG capsule, Take 2 capsules (300 mg total) by mouth every 12 (twelve) hours., Disp: 112 capsule, Rfl: 1;  OVER THE COUNTER MEDICATION, Take 2 tablets by mouth 2 (two) times daily. Bee's wax, Disp: , Rfl:  potassium chloride SA  (K-DUR,KLOR-CON) 20 MEQ tablet, Take 20 mEq by mouth daily., Disp: , Rfl: ;  [DISCONTINUED] lisinopril (ZESTRIL) 2.5 MG tablet, Take 1 tablet (2.5 mg total) by mouth daily., Disp: 90 tablet, Rfl: 4  Allergies: No Known Allergies  Past Medical History, Surgical history, Social history, and Family History were reviewed and updated.  Review of Systems: Constitutional:  Negative for fever, chills, night sweats, anorexia, weight loss, pain. Cardiovascular: no chest pain or dyspnea on exertion Respiratory: negative Neurological: negative Dermatological: negative ENT: negative Skin: Negative. Gastrointestinal: negative Genito-Urinary: negative Hematological and Lymphatic: negative Breast: negative Musculoskeletal: negative Remaining ROS negative.  Physical Exam: Blood pressure 107/53, pulse 72, temperature 97.3 F (36.3 C), temperature source Oral, resp. rate 20, height 5\' 8"  (1.727 m), weight 208 lb 4.8 oz (94.484 kg). ECOG: 1 General appearance: alert Head: Normocephalic, without obvious abnormality, atraumatic Neck: no adenopathy, no carotid bruit, no JVD, supple, symmetrical, trachea midline and thyroid not enlarged, symmetric, no tenderness/mass/nodules Lymph nodes: Cervical, supraclavicular, and axillary nodes normal. Heart:irregularly irregular rhythm Lung:chest clear, no wheezing, rales, normal symmetric air entry Abdomen: soft, non-tender, without masses or organomegaly EXT:no erythema, induration, or nodules   Lab Results: Lab Results  Component Value Date   WBC 9.4 02/09/2013   HGB 13.7 02/09/2013   HCT 40.9 02/09/2013   MCV 89.9 02/09/2013   PLT 292 02/09/2013     Chemistry      Component Value Date/Time   NA 138 01/10/2013 1122   NA 139 05/24/2012 1004   K 4.1  01/10/2013 1122   K 4.7 05/24/2012 1004   CL 103 01/10/2013 1122   CL 104 05/24/2012 1004   CO2 28 01/10/2013 1122   CO2 26 05/24/2012 1004   BUN 19.1 01/10/2013 1122   BUN 17 05/24/2012 1004   CREATININE 0.9  01/10/2013 1122   CREATININE 0.82 05/24/2012 1004      Component Value Date/Time   CALCIUM 9.0 01/10/2013 1122   CALCIUM 9.4 05/24/2012 1004   ALKPHOS 55 01/10/2013 1122   ALKPHOS 38* 05/24/2012 1004   AST 9 01/10/2013 1122   AST 15 05/24/2012 1004   ALT 19 01/10/2013 1122   ALT 19 05/24/2012 1004   BILITOT 1.23* 01/10/2013 1122   BILITOT 0.5 05/24/2012 1004     Impression and Plan:   This is a pleasant 72 year old with: 1. CML. On Tasigna with a significant drop in her WBC. She is tolerating this medication well. Recommend that she proceed with Tasigna without dose modification. She has achieved a complete hematological response.   2. Hypothyroidism: On Synthroid. TSH was normal on 12/07/12.   3. Anxiety: She has received Ativan, but states this did not help. She has been on Tranxene before and has requested this again. I have filled this for her.    4. Follow-up: In 5-6  Weeks.     [id number] 3/13/20143:15 PM

## 2013-02-09 NOTE — Telephone Encounter (Signed)
Gave pt appt for ML and MD for 03/22/13

## 2013-02-23 ENCOUNTER — Encounter: Payer: Self-pay | Admitting: Oncology

## 2013-02-23 NOTE — Progress Notes (Signed)
[facility] @ [id number] for tasigna pa; should receive response within 24-72 hours.

## 2013-02-24 ENCOUNTER — Encounter: Payer: Self-pay | Admitting: Oncology

## 2013-02-24 NOTE — Progress Notes (Signed)
[employer] approved tasigna 150mg  from 02/22/13-02/23/15.

## 2013-03-22 ENCOUNTER — Telehealth: Payer: Self-pay | Admitting: Oncology

## 2013-03-22 ENCOUNTER — Other Ambulatory Visit (HOSPITAL_BASED_OUTPATIENT_CLINIC_OR_DEPARTMENT_OTHER): Payer: Medicare HMO | Admitting: Lab

## 2013-03-22 ENCOUNTER — Ambulatory Visit (HOSPITAL_BASED_OUTPATIENT_CLINIC_OR_DEPARTMENT_OTHER): Payer: Commercial Managed Care - HMO | Admitting: Oncology

## 2013-03-22 ENCOUNTER — Encounter: Payer: Self-pay | Admitting: Oncology

## 2013-03-22 VITALS — BP 117/62 | HR 55 | Temp 97.0°F | Resp 18 | Ht 68.0 in | Wt 203.7 lb

## 2013-03-22 DIAGNOSIS — C9210 Chronic myeloid leukemia, BCR/ABL-positive, not having achieved remission: Secondary | ICD-10-CM

## 2013-03-22 LAB — COMPREHENSIVE METABOLIC PANEL (CC13)
ALT: 16 U/L (ref 0–55)
Alkaline Phosphatase: 53 U/L (ref 40–150)
Creatinine: 0.9 mg/dL (ref 0.6–1.1)
Glucose: 241 mg/dl — ABNORMAL HIGH (ref 70–99)
Sodium: 138 mEq/L (ref 136–145)
Total Bilirubin: 0.92 mg/dL (ref 0.20–1.20)
Total Protein: 6.6 g/dL (ref 6.4–8.3)

## 2013-03-22 LAB — CBC WITH DIFFERENTIAL/PLATELET
BASO%: 1.2 % (ref 0.0–2.0)
LYMPH%: 28.1 % (ref 14.0–49.7)
MCH: 28.8 pg (ref 25.1–34.0)
MCHC: 32.3 g/dL (ref 31.5–36.0)
MCV: 89.1 fL (ref 79.5–101.0)
MONO%: 5.7 % (ref 0.0–14.0)
Platelets: 300 10*3/uL (ref 145–400)
RBC: 4.76 10*6/uL (ref 3.70–5.45)

## 2013-03-22 NOTE — Telephone Encounter (Signed)
gv and printed appt sched and avs for pt for May °

## 2013-03-22 NOTE — Progress Notes (Signed)
Hematology and Oncology Follow Up Visit  Sara Warner [id number] 1941-04-14 72 y.o. 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 Diagnosed a bone marrow biopsy on 10/24/2012.   Current treatment: Tasigna 300 mg daily started 11/2012.   Interim History:  [name] presents today for a follow up visit. She is a 72 year old with the above diagnosis presents today for routine follow-up by herself. She continues to do well with Tasigna without complications. Clinically she is asymptomatic she does not report any fevers, does not report any chills, does not report any lymphadenopathy. Has mild fatigue, but has not had any deterioration in her performance status. Has not had any constitutional symptoms. She does not report any back pain or bony pain. She has not really reported any major changes in her activity level. She is able to drive and again attend to most activities of daily living without any hindrance or decline.  Medications: I have reviewed the patient's current medications. Current outpatient prescriptions:aspirin 325 MG EC tablet, Take 325 mg by mouth daily., Disp: , Rfl: ;  carvedilol (COREG) 12.5 MG tablet, Take 12.5 mg by mouth 2 (two) times daily with a meal., Disp: , Rfl: ;  clorazepate (TRANXENE-T) 7.5 MG tablet, Take 1 tablet (7.5 mg total) by mouth 2 (two) times daily as needed for anxiety., Disp: 30 tablet, Rfl: 1;  digoxin (LANOXIN) 0.25 MG tablet, Take 250 mcg by mouth daily., Disp: , Rfl:  furosemide (LASIX) 20 MG tablet, Take 20 mg by mouth daily. , Disp: , Rfl: ;  Levothyroxine Sodium (SYNTHROID PO), Take 125 mcg by mouth daily. , Disp: , Rfl: ;  nilotinib (TASIGNA) 150 MG capsule, Take 2 capsules (300 mg total) by mouth every 12 (twelve) hours., Disp: 112 capsule, Rfl: 1;  OVER THE COUNTER MEDICATION, Take 2 tablets by mouth 2 (two) times daily. Bee's wax, Disp: , Rfl:  potassium chloride SA  (K-DUR,KLOR-CON) 20 MEQ tablet, Take 20 mEq by mouth daily., Disp: , Rfl: ;  [DISCONTINUED] lisinopril (ZESTRIL) 2.5 MG tablet, Take 1 tablet (2.5 mg total) by mouth daily., Disp: 90 tablet, Rfl: 4  Allergies: No Known Allergies  Past Medical History, Surgical history, Social history, and Family History were reviewed and updated.  Review of Systems: Constitutional:  Negative for fever, chills, night sweats, anorexia, weight loss, pain. Cardiovascular: no chest pain or dyspnea on exertion Respiratory: negative Neurological: negative Dermatological: negative ENT: negative Skin: Negative. Gastrointestinal: negative Genito-Urinary: negative Hematological and Lymphatic: negative Breast: negative Musculoskeletal: negative Remaining ROS negative.  Physical Exam: Blood pressure 117/62, pulse 55, temperature 97 F (36.1 C), temperature source Oral, resp. rate 18, height 5\' 8"  (1.727 m), weight 203 lb 11.2 oz (92.398 kg). ECOG: 1 General appearance: alert Head: Normocephalic, without obvious abnormality, atraumatic Neck: no adenopathy, no carotid bruit, no JVD, supple, symmetrical, trachea midline and thyroid not enlarged, symmetric, no tenderness/mass/nodules Lymph nodes: Cervical, supraclavicular, and axillary nodes normal. Heart:irregularly irregular rhythm Lung:chest clear, no wheezing, rales, normal symmetric air entry Abdomen: soft, non-tender, without masses or organomegaly EXT:no erythema, induration, or nodules   Lab Results: Lab Results  Component Value Date   WBC 10.5* 03/22/2013   HGB 13.7 03/22/2013   HCT 42.4 03/22/2013   MCV 89.1 03/22/2013   PLT 300 03/22/2013     Chemistry      Component Value Date/Time   NA 138 03/22/2013 1101   NA 139 05/24/2012 1004   K 4.1 03/22/2013  1101   K 4.7 05/24/2012 1004   CL 104 03/22/2013 1101   CL 104 05/24/2012 1004   CO2 25 03/22/2013 1101   CO2 26 05/24/2012 1004   BUN 17.2 03/22/2013 1101   BUN 17 05/24/2012 1004   CREATININE 0.9  03/22/2013 1101   CREATININE 0.82 05/24/2012 1004      Component Value Date/Time   CALCIUM 8.9 03/22/2013 1101   CALCIUM 9.4 05/24/2012 1004   ALKPHOS 53 03/22/2013 1101   ALKPHOS 38* 05/24/2012 1004   AST 13 03/22/2013 1101   AST 15 05/24/2012 1004   ALT 16 03/22/2013 1101   ALT 19 05/24/2012 1004   BILITOT 0.92 03/22/2013 1101   BILITOT 0.5 05/24/2012 1004     Impression and Plan:   This is a pleasant 72 year old with: 1. CML. On Tasigna with a significant drop in her WBC. She is tolerating this medication well. Recommend that she proceed with Tasigna without dose modification. She has achieved a complete hematological response.   2. Hypothyroidism: On Synthroid. TSH was normal on 12/07/12.   3. Anxiety: Using Tranxene as needed.  4. Follow-up: In 5-6  Weeks.     [name] 4/23/201412:59 PM

## 2013-03-24 ENCOUNTER — Other Ambulatory Visit: Payer: Self-pay | Admitting: Oncology

## 2013-04-26 ENCOUNTER — Telehealth: Payer: Self-pay | Admitting: Oncology

## 2013-04-26 ENCOUNTER — Ambulatory Visit (HOSPITAL_BASED_OUTPATIENT_CLINIC_OR_DEPARTMENT_OTHER): Payer: Medicare HMO | Admitting: Oncology

## 2013-04-26 ENCOUNTER — Other Ambulatory Visit (HOSPITAL_BASED_OUTPATIENT_CLINIC_OR_DEPARTMENT_OTHER): Payer: Medicare HMO | Admitting: Lab

## 2013-04-26 VITALS — BP 112/52 | HR 74 | Temp 97.8°F | Resp 18 | Ht 68.0 in | Wt 202.3 lb

## 2013-04-26 DIAGNOSIS — C9210 Chronic myeloid leukemia, BCR/ABL-positive, not having achieved remission: Secondary | ICD-10-CM

## 2013-04-26 DIAGNOSIS — F411 Generalized anxiety disorder: Secondary | ICD-10-CM

## 2013-04-26 DIAGNOSIS — E039 Hypothyroidism, unspecified: Secondary | ICD-10-CM

## 2013-04-26 LAB — COMPREHENSIVE METABOLIC PANEL (CC13)
Alkaline Phosphatase: 54 U/L (ref 40–150)
BUN: 14.5 mg/dL (ref 7.0–26.0)
CO2: 25 mEq/L (ref 22–29)
Creatinine: 0.8 mg/dL (ref 0.6–1.1)
Glucose: 177 mg/dl — ABNORMAL HIGH (ref 70–99)
Sodium: 140 mEq/L (ref 136–145)
Total Bilirubin: 1.27 mg/dL — ABNORMAL HIGH (ref 0.20–1.20)
Total Protein: 6.5 g/dL (ref 6.4–8.3)

## 2013-04-26 LAB — CBC WITH DIFFERENTIAL/PLATELET
Eosinophils Absolute: 0.4 10*3/uL (ref 0.0–0.5)
HCT: 40.9 % (ref 34.8–46.6)
LYMPH%: 32.1 % (ref 14.0–49.7)
MCV: 85.9 fL (ref 79.5–101.0)
MONO%: 6.2 % (ref 0.0–14.0)
NEUT#: 6 10*3/uL (ref 1.5–6.5)
NEUT%: 56.7 % (ref 38.4–76.8)
Platelets: 288 10*3/uL (ref 145–400)
RBC: 4.75 10*6/uL (ref 3.70–5.45)

## 2013-04-26 NOTE — Telephone Encounter (Signed)
gv and printed appt sched and avs for pt  °

## 2013-04-26 NOTE — Progress Notes (Signed)
Hematology and Oncology Follow Up Visit  Ameris Hochberg [id number] Mar 08, 1941 72 y.o. 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 Diagnosed a bone marrow biopsy on 10/24/2012.   Current treatment: Tasigna 300 mg daily started 11/2012.   Interim History:  Sara Warner presents today for a follow up visit. She is a 72 year old with the above diagnosis presents today for routine follow-up. She continues to do well with Tasigna without complications. Clinically she is asymptomatic she does not report any fevers, does not report any chills, does not report any lymphadenopathy. Has mild fatigue, but has not had any deterioration in her performance status. Has not had any constitutional symptoms. She does not report any back pain or bony pain. She has not really reported any major changes in her activity level. She is able to drive and again attend to most activities of daily living without any hindrance or decline. She has not reported any illnesses or hospitalizations.   Medications: I have reviewed the patient's current medications. Current Outpatient Prescriptions  Medication Sig Dispense Refill  . aspirin 325 MG EC tablet Take 325 mg by mouth daily.      . carvedilol (COREG) 12.5 MG tablet Take 12.5 mg by mouth 2 (two) times daily with a meal.      . clorazepate (TRANXENE-T) 7.5 MG tablet Take 1 tablet (7.5 mg total) by mouth 2 (two) times daily as needed for anxiety.  30 tablet  1  . digoxin (LANOXIN) 0.25 MG tablet Take 250 mcg by mouth daily.      . furosemide (LASIX) 20 MG tablet Take 20 mg by mouth daily.       . Levothyroxine Sodium (SYNTHROID PO) Take 125 mcg by mouth daily.       Marland Kitchen OVER THE COUNTER MEDICATION Take 2 tablets by mouth 2 (two) times daily. Bee's wax      . potassium chloride SA (K-DUR,KLOR-CON) 20 MEQ tablet Take 20 mEq by mouth daily.      Marland Kitchen TASIGNA 150 MG capsule TAKE 2 CAPSULES BY MOUTH EVERY 12  HOURS.  112 capsule  1  . [DISCONTINUED] lisinopril (ZESTRIL) 2.5 MG tablet Take 1 tablet (2.5 mg total) by mouth daily.  90 tablet  4   No current facility-administered medications for this visit.    Allergies: No Known Allergies  Past Medical History, Surgical history, Social history, and Family History were reviewed and updated.  Review of Systems: Constitutional:  Negative for fever, chills, night sweats, anorexia, weight loss, pain. Cardiovascular: no chest pain or dyspnea on exertion Respiratory: negative Neurological: negative Dermatological: negative ENT: negative Skin: Negative. Gastrointestinal: negative Genito-Urinary: negative Hematological and Lymphatic: negative Breast: negative Musculoskeletal: negative Remaining ROS negative.  Physical Exam: Blood pressure 112/52, pulse 74, temperature 97.8 F (36.6 C), temperature source Oral, resp. rate 18, height 5\' 8"  (1.727 m), weight 202 lb 4.8 oz (91.763 kg). ECOG: 1 General appearance: alert Head: Normocephalic, without obvious abnormality, atraumatic Neck: no adenopathy, no carotid bruit, no JVD, supple, symmetrical, trachea midline and thyroid not enlarged, symmetric, no tenderness/mass/nodules Lymph nodes: Cervical, supraclavicular, and axillary nodes normal. Heart:irregularly irregular rhythm Lung:chest clear, no wheezing, rales, normal symmetric air entry Abdomen: soft, non-tender, without masses or organomegaly EXT:no erythema, induration, or nodules   Lab Results: Lab Results  Component Value Date   WBC 10.6* 04/26/2013   HGB 13.8 04/26/2013   HCT 40.9 04/26/2013   MCV 85.9 04/26/2013   PLT  288 04/26/2013     Chemistry      Component Value Date/Time   NA 140 04/26/2013 1252   NA 139 05/24/2012 1004   K 4.0 04/26/2013 1252   K 4.7 05/24/2012 1004   CL 106 04/26/2013 1252   CL 104 05/24/2012 1004   CO2 25 04/26/2013 1252   CO2 26 05/24/2012 1004   BUN 14.5 04/26/2013 1252   BUN 17 05/24/2012 1004   CREATININE  0.8 04/26/2013 1252   CREATININE 0.82 05/24/2012 1004      Component Value Date/Time   CALCIUM 9.0 04/26/2013 1252   CALCIUM 9.4 05/24/2012 1004   ALKPHOS 54 04/26/2013 1252   ALKPHOS 38* 05/24/2012 1004   AST 18 04/26/2013 1252   AST 15 05/24/2012 1004   ALT 27 04/26/2013 1252   ALT 19 05/24/2012 1004   BILITOT 1.27* 04/26/2013 1252   BILITOT 0.5 05/24/2012 1004     Impression and Plan:   This is a pleasant 72 year old with:  1. CML. On Tasigna since 11/2012 with normalization of her her WBC. She is tolerating this medication well. Recommend that she proceed with Tasigna without dose modification. She has achieved a complete hematological response. I will check a BCR-ABL molecular study in 05/2013.  2. Hypothyroidism: On Synthroid. TSH was normal on 12/07/12.   3. Anxiety: Using Tranxene as needed.  4. Follow-up: In 6  Weeks.     [id number] 5/28/20141:42 PM

## 2013-05-12 ENCOUNTER — Telehealth: Payer: Self-pay | Admitting: *Deleted

## 2013-05-12 NOTE — Telephone Encounter (Signed)
Lm informing the pt that her appt time had changed. gv appt d/t for 08/09/13@12 :45pm for labs  And ov@ 1:15pm..i also mailed a letter/avs as well....td

## 2013-05-22 ENCOUNTER — Other Ambulatory Visit: Payer: Self-pay | Admitting: Oncology

## 2013-05-24 ENCOUNTER — Other Ambulatory Visit: Payer: Self-pay | Admitting: *Deleted

## 2013-05-24 MED ORDER — POTASSIUM CHLORIDE CRYS ER 20 MEQ PO TBCR: 20.0000 meq | EXTENDED_RELEASE_TABLET | Freq: Every day | ORAL | Status: DC

## 2013-06-15 ENCOUNTER — Ambulatory Visit (HOSPITAL_BASED_OUTPATIENT_CLINIC_OR_DEPARTMENT_OTHER): Payer: Medicare HMO | Admitting: Oncology

## 2013-06-15 ENCOUNTER — Other Ambulatory Visit (HOSPITAL_BASED_OUTPATIENT_CLINIC_OR_DEPARTMENT_OTHER): Payer: Medicare HMO | Admitting: Lab

## 2013-06-15 ENCOUNTER — Encounter: Payer: Self-pay | Admitting: Oncology

## 2013-06-15 VITALS — BP 127/72 | HR 58 | Temp 97.3°F | Resp 18 | Ht 68.0 in | Wt 201.2 lb

## 2013-06-15 DIAGNOSIS — C9210 Chronic myeloid leukemia, BCR/ABL-positive, not having achieved remission: Secondary | ICD-10-CM

## 2013-06-15 DIAGNOSIS — E039 Hypothyroidism, unspecified: Secondary | ICD-10-CM

## 2013-06-15 DIAGNOSIS — F411 Generalized anxiety disorder: Secondary | ICD-10-CM

## 2013-06-15 LAB — COMPREHENSIVE METABOLIC PANEL (CC13)
Alkaline Phosphatase: 55 U/L (ref 40–150)
Glucose: 208 mg/dl — ABNORMAL HIGH (ref 70–140)
Sodium: 140 mEq/L (ref 136–145)
Total Bilirubin: 0.98 mg/dL (ref 0.20–1.20)
Total Protein: 6.6 g/dL (ref 6.4–8.3)

## 2013-06-15 LAB — CBC WITH DIFFERENTIAL/PLATELET
EOS%: 2.7 % (ref 0.0–7.0)
Eosinophils Absolute: 0.3 10*3/uL (ref 0.0–0.5)
LYMPH%: 26.1 % (ref 14.0–49.7)
MCH: 28 pg (ref 25.1–34.0)
MCHC: 32.8 g/dL (ref 31.5–36.0)
MCV: 85.4 fL (ref 79.5–101.0)
MONO%: 4.8 % (ref 0.0–14.0)
Platelets: 327 10*3/uL (ref 145–400)
RBC: 4.96 10*6/uL (ref 3.70–5.45)

## 2013-06-15 NOTE — Progress Notes (Signed)
Hematology and Oncology Follow Up Visit  Sara Warner [id number] 24-Apr-1941 72 y.o. 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 Diagnosed a bone marrow biopsy on 10/24/2012.   Current treatment: Tasigna 300 mg daily started 11/2012.   Interim History:  Sara Warner presents today for a follow up visit. She is a 72 year old with the above diagnosis presents today for routine follow-up. She continues to do well with Tasigna without complications. Clinically she is asymptomatic she does not report any fevers, does not report any chills, does not report any lymphadenopathy. Has mild fatigue, but has not had any deterioration in her performance status. Has not had any constitutional symptoms. She does not report any back pain or bony pain. She has not really reported any major changes in her activity level. She is able to drive and again attend to most activities of daily living without any hindrance or decline. Reports that she has missed several doses of her Tasigna. She has not reported any illnesses or hospitalizations.   Medications: I have reviewed the patient's current medications. Current Outpatient Prescriptions  Medication Sig Dispense Refill  . aspirin 325 MG EC tablet Take 325 mg by mouth daily.      . carvedilol (COREG) 12.5 MG tablet Take 12.5 mg by mouth 2 (two) times daily with a meal.      . clorazepate (TRANXENE-T) 7.5 MG tablet Take 1 tablet (7.5 mg total) by mouth 2 (two) times daily as needed for anxiety.  30 tablet  1  . digoxin (LANOXIN) 0.25 MG tablet Take 250 mcg by mouth daily.      . furosemide (LASIX) 20 MG tablet Take 20 mg by mouth daily.       . Levothyroxine Sodium (SYNTHROID PO) Take 125 mcg by mouth daily.       Marland Kitchen OVER THE COUNTER MEDICATION Take 2 tablets by mouth 2 (two) times daily. Bee's wax      . potassium chloride SA (K-DUR,KLOR-CON) 20 MEQ tablet Take 1 tablet (20 mEq total) by  mouth daily.  30 tablet  1  . TASIGNA 150 MG capsule TAKE 2 CAPSULES BY MOUTH EVERY 12 HOURS.  112 capsule  1  . [DISCONTINUED] lisinopril (ZESTRIL) 2.5 MG tablet Take 1 tablet (2.5 mg total) by mouth daily.  90 tablet  4   No current facility-administered medications for this visit.    Allergies: No Known Allergies  Past Medical History, Surgical history, Social history, and Family History were reviewed and updated.  Review of Systems: Constitutional:  Negative for fever, chills, night sweats, anorexia, weight loss, pain. Cardiovascular: no chest pain or dyspnea on exertion Respiratory: negative Neurological: negative Dermatological: negative ENT: negative Skin: Negative. Gastrointestinal: negative Genito-Urinary: negative Hematological and Lymphatic: negative Breast: negative Musculoskeletal: negative Remaining ROS negative.  Physical Exam: Blood pressure 127/72, pulse 58, temperature 97.3 F (36.3 C), temperature source Oral, resp. rate 18, height 5\' 8"  (1.727 m), weight 201 lb 3.2 oz (91.264 kg). ECOG: 1 General appearance: alert Head: Normocephalic, without obvious abnormality, atraumatic Neck: no adenopathy, no carotid bruit, no JVD, supple, symmetrical, trachea midline and thyroid not enlarged, symmetric, no tenderness/mass/nodules Lymph nodes: Cervical, supraclavicular, and axillary nodes normal. Heart:irregularly irregular rhythm Lung:chest clear, no wheezing, rales, normal symmetric air entry Abdomen: soft, non-tender, without masses or organomegaly EXT:no erythema, induration, or nodules   Lab Results: Lab Results  Component Value Date   WBC 11.4* 06/15/2013   HGB 13.9  06/15/2013   HCT 42.3 06/15/2013   MCV 85.4 06/15/2013   PLT 327 06/15/2013     Chemistry      Component Value Date/Time   NA 140 06/15/2013 1109   NA 139 05/24/2012 1004   K 4.3 06/15/2013 1109   K 4.7 05/24/2012 1004   CL 106 04/26/2013 1252   CL 104 05/24/2012 1004   CO2 27 06/15/2013 1109    CO2 26 05/24/2012 1004   BUN 13.5 06/15/2013 1109   BUN 17 05/24/2012 1004   CREATININE 1.0 06/15/2013 1109   CREATININE 0.82 05/24/2012 1004      Component Value Date/Time   CALCIUM 9.4 06/15/2013 1109   CALCIUM 9.4 05/24/2012 1004   ALKPHOS 55 06/15/2013 1109   ALKPHOS 38* 05/24/2012 1004   AST 14 06/15/2013 1109   AST 15 05/24/2012 1004   ALT 19 06/15/2013 1109   ALT 19 05/24/2012 1004   BILITOT 0.98 06/15/2013 1109   BILITOT 0.5 05/24/2012 1004     Impression and Plan:   This is a pleasant 72 year old with:  1. CML. On Tasigna since 11/2012 with normalization of her her WBC. She is tolerating this medication well. Recommend that she proceed with Tasigna without dose modification. She has achieved a complete hematological response. BCR-ABL molecular study is pending today.  2. Hypothyroidism: On Synthroid. TSH was normal on 12/07/12.   3. Anxiety: Using Tranxene as needed.  4. Follow-up: In 6  Weeks.     [place], [place] 7/17/201412:47 PM

## 2013-06-27 ENCOUNTER — Encounter: Payer: Self-pay | Admitting: Cardiovascular Disease

## 2013-06-27 ENCOUNTER — Ambulatory Visit (INDEPENDENT_AMBULATORY_CARE_PROVIDER_SITE_OTHER): Payer: Medicare HMO | Admitting: Cardiovascular Disease

## 2013-06-27 VITALS — BP 106/76 | HR 66 | Ht 68.0 in | Wt 201.8 lb

## 2013-06-27 DIAGNOSIS — C9210 Chronic myeloid leukemia, BCR/ABL-positive, not having achieved remission: Secondary | ICD-10-CM

## 2013-06-27 DIAGNOSIS — I4891 Unspecified atrial fibrillation: Secondary | ICD-10-CM

## 2013-06-27 DIAGNOSIS — E782 Mixed hyperlipidemia: Secondary | ICD-10-CM

## 2013-06-27 DIAGNOSIS — E039 Hypothyroidism, unspecified: Secondary | ICD-10-CM

## 2013-06-27 NOTE — Patient Instructions (Signed)
Your physician has requested that you have a lexiscan myoview. For further information please visit [email or web address]. Please follow instruction sheet, as given.   Your physician has requested that you have an echocardiogram. Echocardiography is a painless test that uses sound waves to create images of your heart. It provides your doctor with information about the size and shape of your heart and how well your heart's chambers and valves are working. This procedure takes approximately one hour. There are no restrictions for this procedure.  Your physician recommends that you return for lab work fasting. Your physician recommends that you schedule a follow-up appointment in: 6 WEEKS.

## 2013-07-03 ENCOUNTER — Encounter: Payer: Self-pay | Admitting: Cardiovascular Disease

## 2013-07-03 NOTE — Progress Notes (Signed)
Patient ID: Sara Warner, female   DOB: Aug 18, 1941, 72 y.o.   MRN: [id number]     PATIENT PROFILE: Sara Warner is a 72 year old female who presents to the office today to establish cardiology care with me. She has a long history of atrial fibrillation.   HPI: Sara Warner is a former patient of Dr. Doreatha Lew and in February 2013 saw Dr. Stanford Breed for one visit. She has a history of permanent atrial fibrillation and admits to being in atrial fibrillation for approximately 15 years. She had problems with intermittent epistaxis in the past on Coumadin and consequently has not been on anticoagulation therapy. An echo Doppler study in 2008 showed an ejection fraction of 30-35%. A Myoview in February 2009 showed no ischemia but mild global reduction in LV function with an ejection fraction of 47%. An echo in 2010 apparently showed low normal LV function with mild LA enlargement and mild mitral regurgitation and tricuspid regurgitation. 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 She does also have issues with thyroid abnormality. She admits to varicose veins and right lower [place] swelling. She now presents the office today to establish cardiology care with me.  Past Medical History  Diagnosis Date  . Arrhythmia     chronic atrial fib  . Toxic goiter   . Ejection fraction < 50%   . Hypothyroidism   . Atrial fibrillation   . CHF (congestive heart failure)   . Elevated WBC count   . 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 Atrial fibrillation     Past Surgical History  Procedure Laterality Date  . Cystectomy    . Hysterotomy    . Cardioversion      No Known Allergies  Current Outpatient Prescriptions  Medication Sig Dispense Refill  . aspirin 325 MG EC tablet Take 325 mg by mouth daily.      . carvedilol (COREG) 12.5 MG tablet Take 12.5 mg by mouth 2 (two) times daily with a meal.      . clorazepate (TRANXENE-T) 7.5 MG tablet Take 1 tablet (7.5 mg total) by mouth 2 (two) times  daily as needed for anxiety.  30 tablet  1  . digoxin (LANOXIN) 0.25 MG tablet Take 250 mcg by mouth daily.      . furosemide (LASIX) 20 MG tablet Take 20 mg by mouth daily.       . Levothyroxine Sodium (SYNTHROID PO) Take 125 mcg by mouth daily.       . potassium chloride SA (K-DUR,KLOR-CON) 20 MEQ tablet Take 1 tablet (20 mEq total) by mouth daily.  30 tablet  1  . TASIGNA 150 MG capsule TAKE 2 CAPSULES BY MOUTH EVERY 12 HOURS.  112 capsule  1  . [DISCONTINUED] lisinopril (ZESTRIL) 2.5 MG tablet Take 1 tablet (2.5 mg total) by mouth daily.  90 tablet  4   No current facility-administered medications for this visit.    Socially she is widowed for 23 years. She has 5 children 15 grandchildren and 17 great grandchildren. She lives at home with her 2 grandchildren. She is retired. She completed ninth grade of education. She is a former smoker but quit smoking in 1987 there prior to that she had smoked up to 2 packs per day for 20 years. There is no alcohol use.  Family History  Problem Relation Age of Onset  . Cancer - Colon Mother     mast to lungs  . Diabetes Father   . Diabetes Paternal Grandmother     ROS  is negative for fever chills or night sweats. She does have a history of nosebleeds in the past on Coumadin. Her last cautery was approximate 5 years ago. She denies chest pressure. She does note shortness of breath. She is followed closely for her CML by oncology. She denies bleeding. She denies change in bowel or bladder habits. She does have varicose veins. She admits to intermittent leg swelling typically right leg. She denies paresthesias. There are no seizures. She denies any significant episodes of awareness of sleep disordered breathing. Other system review is negative.  PE BP 106/76  Pulse 66  Ht 5\' 8"  (1.727 m)  Wt 201 lb 12.8 oz (91.536 kg)  BMI 30.69 kg/m2 General: Alert, oriented, no distress.  Skin: normal turgor, no rashes HEENT: Normocephalic, atraumatic. Pupils  round and reactive; sclera anicteric; Fundi without hemorrhages or exudates. Nose without nasal septal hypertrophy Mouth/Parynx benign; Mallinpatti scale 3 Neck: No JVD, no carotid briuts Lungs: clear to ausculatation and percussion; no wheezing or rales Heart: RRR, s1 s2 normal  Abdomen: soft, nontender; no hepatosplenomehaly, BS+; abdominal aorta nontender and not dilated by palpation. Pulses 2+ Extremities: Mild varicosities, trace right lower extremity edema, no clubbinbg cyanosis, Homan's sign negative  Neurologic: grossly nonfocal    ECG: Atrial fibrillation with a ventricular response in the 60s. Specific inferolateral ST segment changes. QTc interval 387.  LABS:  BMET    Component Value Date/Time   NA 140 06/15/2013 1109   NA 139 05/24/2012 1004   K 4.3 06/15/2013 1109   K 4.7 05/24/2012 1004   CL 106 04/26/2013 1252   CL 104 05/24/2012 1004   CO2 27 06/15/2013 1109   CO2 26 05/24/2012 1004   GLUCOSE 208* 06/15/2013 1109   GLUCOSE 177* 04/26/2013 1252   GLUCOSE 124* 05/24/2012 1004   BUN 13.5 06/15/2013 1109   BUN 17 05/24/2012 1004   CREATININE 1.0 06/15/2013 1109   CREATININE 0.82 05/24/2012 1004   CALCIUM 9.4 06/15/2013 1109   CALCIUM 9.4 05/24/2012 1004     Hepatic Function Panel     Component Value Date/Time   PROT 6.6 06/15/2013 1109   PROT 6.5 05/24/2012 1004   ALBUMIN 3.8 06/15/2013 1109   ALBUMIN 4.6 05/24/2012 1004   AST 14 06/15/2013 1109   AST 15 05/24/2012 1004   ALT 19 06/15/2013 1109   ALT 19 05/24/2012 1004   ALKPHOS 55 06/15/2013 1109   ALKPHOS 38* 05/24/2012 1004   BILITOT 0.98 06/15/2013 1109   BILITOT 0.5 05/24/2012 1004     CBC    Component Value Date/Time   WBC 11.4* 06/15/2013 1109   WBC 50.6* 10/24/2012 0755   RBC 4.96 06/15/2013 1109   RBC 4.61 10/24/2012 0755   HGB 13.9 06/15/2013 1109   HGB 13.9 10/24/2012 0755   HCT 42.3 06/15/2013 1109   HCT 42.1 10/24/2012 0755   PLT 327 06/15/2013 1109   PLT 420* 10/24/2012 0755   MCV 85.4 06/15/2013 1109    MCV 91.3 10/24/2012 0755   MCH 28.0 06/15/2013 1109   MCH 30.2 10/24/2012 0755   MCHC 32.8 06/15/2013 1109   MCHC 33.0 10/24/2012 0755   RDW 14.2 06/15/2013 1109   RDW 15.7* 10/24/2012 0755   LYMPHSABS 3.0 06/15/2013 1109   MONOABS 0.5 06/15/2013 1109   EOSABS 0.3 06/15/2013 1109   BASOSABS 0.3* 06/15/2013 1109     BNP No results found for this basename: probnp    Lipid Panel  No results found for this basename: chol,  trig, hdl, cholhdl, vldl, ldlcalc     RADIOLOGY: No results found.   ASSESSMENT AND PLAN: Ms. Warner is a 72 year old female with a history of permanent atrial fibrillation. She presently is not on anticoagulation due to history of frequent nosebleeds in the past requiring cauterization. We did discuss the potential for CVA risk with atrial fibrillation compounding over the years. She is on aspirin.  She also has been diagnosed with CML and is undergoing treatment. She did have a significant tobacco history choking 2 packs per day for over 20 years. She does have nonspecific ST-T changes inferolaterally on her ECG. She does note some mild shortness of breath. I'm recommending she undergo a followup 2-D echo Doppler study. It has been over 5 years since her last Myoview scan and I will schedule her for [place] study to make certain she does not have ischemia. Laboratory will be drawn consisting of a CBC with differential, comprehensive metabolic panel, lipid panel, magnesium level and TSH. I will see her back in the office in followup of the above studies in approximately 6 weeks    Troy Sine, MD, [facility] 07/03/2013 5:55 PM

## 2013-07-04 ENCOUNTER — Ambulatory Visit (HOSPITAL_COMMUNITY)
Admission: RE | Admit: 2013-07-04 | Discharge: 2013-07-04 | Disposition: A | Discharge status: Home / Self Care | Payer: Medicare HMO | Source: Ambulatory Visit | Attending: Cardiovascular Disease | Admitting: Cardiovascular Disease

## 2013-07-04 DIAGNOSIS — I4891 Unspecified atrial fibrillation: Secondary | ICD-10-CM

## 2013-07-05 ENCOUNTER — Ambulatory Visit (HOSPITAL_COMMUNITY)
Admission: RE | Admit: 2013-07-05 | Discharge: 2013-07-05 | Disposition: A | Discharge status: Home / Self Care | Payer: Medicare HMO | Source: Ambulatory Visit | Attending: Cardiovascular Disease | Admitting: Cardiovascular Disease

## 2013-07-05 DIAGNOSIS — Z87891 Personal history of nicotine dependence: Secondary | ICD-10-CM | POA: Insufficient documentation

## 2013-07-05 DIAGNOSIS — R002 Palpitations: Secondary | ICD-10-CM | POA: Insufficient documentation

## 2013-07-05 DIAGNOSIS — I4891 Unspecified atrial fibrillation: Secondary | ICD-10-CM | POA: Insufficient documentation

## 2013-07-05 DIAGNOSIS — R0602 Shortness of breath: Secondary | ICD-10-CM | POA: Insufficient documentation

## 2013-07-05 DIAGNOSIS — Z8249 Family history of ischemic heart disease and other diseases of the circulatory system: Secondary | ICD-10-CM | POA: Insufficient documentation

## 2013-07-05 DIAGNOSIS — I428 Other cardiomyopathies: Secondary | ICD-10-CM | POA: Insufficient documentation

## 2013-07-05 DIAGNOSIS — E663 Overweight: Secondary | ICD-10-CM | POA: Insufficient documentation

## 2013-07-05 DIAGNOSIS — I509 Heart failure, unspecified: Secondary | ICD-10-CM | POA: Insufficient documentation

## 2013-07-05 DIAGNOSIS — R5381 Other malaise: Secondary | ICD-10-CM | POA: Insufficient documentation

## 2013-07-05 MED ORDER — REGADENOSON 0.4 MG/5ML IV SOLN
0.4000 mg | Freq: Once | INTRAVENOUS | Status: AC
  Administered 2013-07-05: 0.4 mg via INTRAVENOUS

## 2013-07-05 MED ORDER — TECHNETIUM TC 99M SESTAMIBI GENERIC - CARDIOLITE
10.0000 | Freq: Once | INTRAVENOUS | Status: AC | PRN
  Administered 2013-07-05: 10 via INTRAVENOUS

## 2013-07-05 MED ORDER — TECHNETIUM TC 99M SESTAMIBI GENERIC - CARDIOLITE
30.0000 | Freq: Once | INTRAVENOUS | Status: AC | PRN
  Administered 2013-07-05: 30 via INTRAVENOUS

## 2013-07-05 NOTE — Procedures (Addendum)
[facility] [address] [place] [id number]  Cardiology Nuclear Med Study  Sara Warner is a 72 y.o. female     MRN : [id number]     DOB: 05-29-1941  Procedure Date: 07/05/2013  Nuclear Med Background Indication for Stress Test:  Evaluation for Ischemia and Abnormal EKG History:  A-FIB;CHF Cardiac Risk Factors: Family History - CAD, History of Smoking, Lipids, Overweight and CARDIOMYOPATHY  Symptoms:  Fatigue, Palpitations and SOB   Nuclear Pre-Procedure Caffeine/Decaff Intake:  9:00pm NPO After: 7:00am   IV Site: R Hand  IV 0.9% NS with Angio Cath:  22g  Chest Size (in):  N/A IV Started by: Azucena Cecil, RN  Height: 5\' 8"  (1.727 m)  Cup Size: C  BMI:  Body mass index is 30.57 kg/(m^2). Weight:  201 lb (91.173 kg)   Tech Comments:  N/A    Nuclear Med Study 1 or 2 day study: 1 day  Stress Test Type:  [place]  Order Authorizing Provider:  Shelva Majestic, MD   Resting Radionuclide: Technetium 60m Sestamibi  Resting Radionuclide Dose: 10.3 mCi   Stress Radionuclide:  Technetium 73m Sestamibi  Stress Radionuclide Dose: 30.2 mCi           Stress Protocol Rest HR: 70 Stress HR: 98  Rest BP: 150/78 Stress BP: 150/83  Exercise Time (min): n/a METS: n/a          Dose of Adenosine (mg):  n/a Dose of Lexiscan: 0.4 mg  Dose of Atropine (mg): n/a Dose of Dobutamine: n/a mcg/kg/min (at max HR)  Stress Test Technologist: Mellody Memos, CCT Nuclear Technologist: Otho Perl, CNMT   Rest Procedure:  Myocardial perfusion imaging was performed at rest 45 minutes following the intravenous administration of Technetium 59m Sestamibi. Stress Procedure:  The patient received IV Lexiscan 0.4 mg over 15-seconds.  Technetium 57m Sestamibi injected at 30-seconds.  There were no significant changes with Lexiscan.  Quantitative spect images were obtained after a 45 minute delay.  Transient Ischemic Dilatation (Normal  <1.22):  1.08  Lung/Heart Ratio (Normal <0.45):  0.34 QGS EDV: n/a ml QGS ESV:  n/a ml LV Ejection Fraction: Study not gated  Rest ECG: Atrial Fibrilliation  Stress ECG: No significant change from baseline ECG  QPS Raw Data Images:  Normal; no motion artifact; normal heart/lung ratio. Stress Images:  There is decreased uptake in the septum. Rest Images:  There is decreased uptake in the septum. Subtraction (SDS):  No evidence of ischemia.  Impression Exercise Capacity:  Lexiscan with no exercise. BP Response:  Normal blood pressure response. Clinical Symptoms:  There is dyspnea. ECG Impression:  No significant ECG changes with Lexiscan. Comparison with Prior Nuclear Study: No images to compare  Overall Impression:  Low risk stress nuclear study with a small amount of septal, basal anteroseptal artifact. No significant reversible ischemia..  LV Wall Motion:  Non-gated study.  Pixie Casino, MD, [facility] Board Certified in Nuclear Cardiology Attending Cardiologist [facility] [place], MD  07/05/2013 11:50 AM

## 2013-07-06 ENCOUNTER — Encounter: Payer: Self-pay | Admitting: Cardiovascular Disease

## 2013-07-16 ENCOUNTER — Encounter: Payer: Self-pay | Admitting: *Deleted

## 2013-07-16 NOTE — Progress Notes (Signed)
Quick Note:  Note sent to patient. ______

## 2013-07-17 ENCOUNTER — Encounter: Payer: Self-pay | Admitting: *Deleted

## 2013-07-17 ENCOUNTER — Other Ambulatory Visit: Payer: Self-pay | Admitting: Oncology

## 2013-07-17 DIAGNOSIS — C9210 Chronic myeloid leukemia, BCR/ABL-positive, not having achieved remission: Secondary | ICD-10-CM

## 2013-07-17 NOTE — Progress Notes (Signed)
Quick Note:  Note sent to patient. ______

## 2013-07-17 NOTE — Progress Notes (Signed)
Quick Note:  Not sent to patient. ______

## 2013-07-19 ENCOUNTER — Telehealth: Payer: Self-pay | Admitting: Cardiology

## 2013-07-19 NOTE — Telephone Encounter (Signed)
Ne problem    Has question regarding her digoxin dosage . patient is stating one dosage the PCP has another.

## 2013-07-19 NOTE — Telephone Encounter (Addendum)
Spoke with kristin at [facility], the pt was last seen by dr tom Claiborne Billings. according to his note from 06-27-13 the pt is taking lanoxin 0.25 mg daily. She will contact dr Evette Georges office to confirm. She reports the pt is taking 0.125 mg.

## 2013-07-24 ENCOUNTER — Telehealth: Payer: Self-pay | Admitting: Cardiovascular Disease

## 2013-07-24 MED ORDER — DIGOXIN 250 MCG PO TABS: 250.0000 ug | ORAL_TABLET | Freq: Every day | ORAL | Status: DC

## 2013-07-24 NOTE — Telephone Encounter (Signed)
Returned call.  Pt informed message received and note seen by PCP needing to clarify dose.  Pt stated she is taking 0.25 mg Digoxin daily.  Refill(s) sent to pharmacy ([id number]).  Pt verbalized understanding and agreed w/ plan.

## 2013-07-24 NOTE — Telephone Encounter (Signed)
Patient has started seeing dr. Claiborne Billings.  She had an RX for Digoxin from her PCP.  PCP will not call in refill now.  SHe needs new RX called/sent to [facility].

## 2013-08-03 ENCOUNTER — Telehealth: Payer: Self-pay | Admitting: Cardiovascular Disease

## 2013-08-03 NOTE — Telephone Encounter (Signed)
Per Mariann Laster, Dr. Claiborne Billings still wants to f/u with pt to go over tests in detail.  Keep appt.  Returned call and informed pt per instructions by Mariann Laster, CMA w/ Dr. Claiborne Billings.  Pt verbalized understanding and agreed w/ plan.

## 2013-08-03 NOTE — Telephone Encounter (Signed)
She received a letter saying that all her tests were normal. Does she still need to keep her appt?

## 2013-08-04 ENCOUNTER — Other Ambulatory Visit: Payer: Self-pay | Admitting: Cardiovascular Disease

## 2013-08-07 ENCOUNTER — Telehealth: Payer: Self-pay | Admitting: Cardiovascular Disease

## 2013-08-07 NOTE — Telephone Encounter (Signed)
Megan at [facility] is following up on refill request she faxed last week for Furosemide 20 mg.

## 2013-08-07 NOTE — Telephone Encounter (Signed)
Refill request received electronically and completed.

## 2013-08-08 ENCOUNTER — Encounter: Payer: Self-pay | Admitting: Cardiovascular Disease

## 2013-08-08 ENCOUNTER — Ambulatory Visit (INDEPENDENT_AMBULATORY_CARE_PROVIDER_SITE_OTHER): Payer: Medicare HMO | Admitting: Cardiovascular Disease

## 2013-08-08 VITALS — BP 122/72 | HR 80 | Ht 68.0 in | Wt 198.5 lb

## 2013-08-08 DIAGNOSIS — E785 Hyperlipidemia, unspecified: Secondary | ICD-10-CM

## 2013-08-08 DIAGNOSIS — E119 Type 2 diabetes mellitus without complications: Secondary | ICD-10-CM

## 2013-08-08 DIAGNOSIS — C9210 Chronic myeloid leukemia, BCR/ABL-positive, not having achieved remission: Secondary | ICD-10-CM

## 2013-08-08 DIAGNOSIS — Z79899 Other long term (current) drug therapy: Secondary | ICD-10-CM

## 2013-08-08 DIAGNOSIS — E039 Hypothyroidism, unspecified: Secondary | ICD-10-CM

## 2013-08-08 MED ORDER — CARVEDILOL 12.5 MG PO TABS: 18.5000 mg | ORAL_TABLET | Freq: Two times a day (BID) | ORAL | Status: DC

## 2013-08-08 NOTE — Patient Instructions (Addendum)
Your physician recommends that you schedule a follow-up appointment with your PCP and get blood work  Your physician has recommended you make the following change in your medication: Increase your carvedilol to 1 1/2 twice daily  Your physician recommends that you return for lab work in: [place], [place], A1C  Your physician recommends that you schedule a follow-up appointment in: 6 months

## 2013-08-09 ENCOUNTER — Ambulatory Visit: Payer: Medicare HMO | Admitting: Oncology

## 2013-08-09 ENCOUNTER — Other Ambulatory Visit (HOSPITAL_BASED_OUTPATIENT_CLINIC_OR_DEPARTMENT_OTHER): Payer: Commercial Managed Care - HMO | Admitting: Lab

## 2013-08-09 ENCOUNTER — Ambulatory Visit (HOSPITAL_BASED_OUTPATIENT_CLINIC_OR_DEPARTMENT_OTHER): Payer: Medicare HMO | Admitting: Oncology

## 2013-08-09 ENCOUNTER — Telehealth: Payer: Self-pay | Admitting: Oncology

## 2013-08-09 ENCOUNTER — Other Ambulatory Visit: Payer: Medicare HMO | Admitting: Lab

## 2013-08-09 VITALS — BP 121/56 | HR 69 | Temp 98.8°F | Resp 18 | Ht 68.0 in | Wt 198.3 lb

## 2013-08-09 DIAGNOSIS — E039 Hypothyroidism, unspecified: Secondary | ICD-10-CM

## 2013-08-09 DIAGNOSIS — C9210 Chronic myeloid leukemia, BCR/ABL-positive, not having achieved remission: Secondary | ICD-10-CM

## 2013-08-09 DIAGNOSIS — F411 Generalized anxiety disorder: Secondary | ICD-10-CM

## 2013-08-09 LAB — CBC WITH DIFFERENTIAL/PLATELET
BASO%: 1.4 % (ref 0.0–2.0)
Basophils Absolute: 0.2 10*3/uL — ABNORMAL HIGH (ref 0.0–0.1)
EOS%: 2.4 % (ref 0.0–7.0)
HGB: 14.2 g/dL (ref 11.6–15.9)
MCH: 28.6 pg (ref 25.1–34.0)
MONO#: 0.7 10*3/uL (ref 0.1–0.9)
RDW: 14.6 % — ABNORMAL HIGH (ref 11.2–14.5)
WBC: 10.8 10*3/uL — ABNORMAL HIGH (ref 3.9–10.3)
lymph#: 3.2 10*3/uL (ref 0.9–3.3)

## 2013-08-09 LAB — COMPREHENSIVE METABOLIC PANEL (CC13)
CO2: 28 mEq/L (ref 22–29)
Creatinine: 1.3 mg/dL — ABNORMAL HIGH (ref 0.6–1.1)
Glucose: 235 mg/dl — ABNORMAL HIGH (ref 70–140)
Total Bilirubin: 0.9 mg/dL (ref 0.20–1.20)

## 2013-08-09 NOTE — Progress Notes (Signed)
Hematology and Oncology Follow Up Visit  Sara Warner [id number] [birth year] 72 y.o. 08/09/2013 1:01 PM   Principle Diagnosis: 72 year old woman with the leukocytosis and found to have chronic phase CML. Diagnosed a bone marrow biopsy on 10/24/2012.   Current treatment: Tasigna 300 mg bid started 11/2012.   Interim History:  Sara Warner presents today for a follow up visit. She is a 72 year old with the above diagnosis presents today for routine follow-up. She continues to do well with Tasigna without complications. Clinically she is asymptomatic she does not report any fevers, does not report any chills, does not report any lymphadenopathy. She has not had any constitutional symptoms. She does not report any back pain or bony pain. She has not really reported any major changes in her activity level. She is able to drive and again attend to most activities of daily living without any hindrance or decline.   Medications: I have reviewed the patient's current medications. Current Outpatient Prescriptions  Medication Sig Dispense Refill  . aspirin 325 MG EC tablet Take 325 mg by mouth daily.      . carvedilol (COREG) 12.5 MG tablet Take 12.5 mg by mouth 2 (two) times daily with a meal. Add extra 1/2 pill twice daily with whole tablet, per patient      . clorazepate (TRANXENE-T) 7.5 MG tablet Take 1 tablet (7.5 mg total) by mouth 2 (two) times daily as needed for anxiety.  30 tablet  1  . digoxin (LANOXIN) 0.25 MG tablet Take 1 tablet (250 mcg total) by mouth daily.  7 tablet  1  . furosemide (LASIX) 20 MG tablet Take 1 tablet (20 mg total) by mouth daily.  30 tablet  5  . Levothyroxine Sodium (SYNTHROID PO) Take 125 mcg by mouth daily.       . potassium chloride SA (K-DUR,KLOR-CON) 20 MEQ tablet Take 1 tablet (20 mEq total) by mouth daily.  30 tablet  1  . TASIGNA 150 MG capsule TAKE 2 CAPSULES BY MOUTH EVERY 12 HOURS.  112 capsule  0  . [DISCONTINUED] lisinopril (ZESTRIL) 2.5 MG tablet Take  1 tablet (2.5 mg total) by mouth daily.  90 tablet  4   No current facility-administered medications for this visit.    Allergies: No Known Allergies  Past Medical History, Surgical history, Social history, and Family History were reviewed and updated.  Review of Systems:  Remaining ROS negative.  Physical Exam: Blood pressure 121/56, pulse 69, temperature 98.8 F (37.1 C), temperature source Oral, resp. rate 18, height 5\' 8"  (1.727 m), weight 198 lb 4.8 oz (89.948 kg), SpO2 97.00%. ECOG: 1 General appearance: alert Head: Normocephalic, without obvious abnormality, atraumatic Neck: no adenopathy, no carotid bruit, no JVD, supple, symmetrical, trachea midline and thyroid not enlarged, symmetric, no tenderness/mass/nodules Lymph nodes: Cervical, supraclavicular, and axillary nodes normal. Heart:irregularly irregular rhythm Lung:chest clear, no wheezing, rales, normal symmetric air entry Abdomen: soft, non-tender, without masses or organomegaly EXT:no erythema, induration, or nodules   Lab Results: Lab Results  Component Value Date   WBC 10.8* 08/09/2013   HGB 14.2 08/09/2013   HCT 42.2 08/09/2013   MCV 85.4 08/09/2013   PLT 297 08/09/2013     Chemistry      Component Value Date/Time   NA 140 06/15/2013 1109   NA 139 05/24/2012 1004   K 4.3 06/15/2013 1109   K 4.7 05/24/2012 1004   CL 106 04/26/2013 1252   CL 104 05/24/2012 1004   CO2 27 06/15/2013 1109  CO2 26 05/24/2012 1004   BUN 13.5 06/15/2013 1109   BUN 17 05/24/2012 1004   CREATININE 1.0 06/15/2013 1109   CREATININE 0.82 05/24/2012 1004      Component Value Date/Time   CALCIUM 9.4 06/15/2013 1109   CALCIUM 9.4 05/24/2012 1004   ALKPHOS 55 06/15/2013 1109   ALKPHOS 38* 05/24/2012 1004   AST 14 06/15/2013 1109   AST 15 05/24/2012 1004   ALT 19 06/15/2013 1109   ALT 19 05/24/2012 1004   BILITOT 0.98 06/15/2013 1109   BILITOT 0.5 05/24/2012 1004     Impression and Plan:   This is a pleasant 72 year old with:  1. CML. On  Tasigna since 11/2012 with normalization of her her WBC. She is tolerating this medication well. Recommend that she proceed with Tasigna without dose modification. She has achieved a complete hematological response.   2. Hypothyroidism: On Synthroid. TSH was normal on 12/07/12.   3. Anxiety: Using Tranxene as needed.  4. Follow-up: In 3 months.      [id number] 9/10/20141:01 PM

## 2013-08-09 NOTE — Telephone Encounter (Signed)
gv adn printed appts ched and avs for pt for DEC °

## 2013-08-14 ENCOUNTER — Other Ambulatory Visit: Payer: Self-pay | Admitting: Oncology

## 2013-08-18 ENCOUNTER — Encounter: Payer: Self-pay | Admitting: Cardiovascular Disease

## 2013-08-18 NOTE — Progress Notes (Signed)
Patient ID: Sara Warner, female   DOB: [birth year], 73 y.o.   MRN: [id number]     HPI: [name] is a 72 year old female who presents to the office today for followup cardiology evaluation. She is establish care with me on 06/27/2013.  Sara Warner is a former patient of Dr. Doreatha Lew and in February 2013 saw Dr. Stanford Breed for one visit. She has a history of permanent atrial fibrillation and admits to being in atrial fibrillation for approximately 15 years. She had problems with intermittent epistaxis in the past on Coumadin and consequently has not been on anticoagulation therapy. An echo Doppler study in 2008 showed an ejection fraction of 30-35%. A Myoview in February 2009 showed no ischemia but mild global reduction in LV function with an ejection fraction of 47%. An echo in 2010 apparently showed low normal LV function with mild LA enlargement and mild mitral regurgitation and tricuspid regurgitation. 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 She does also have issues with thyroid abnormality. She has varicose veins and notes right lower extremity swelling.  And I initially saw her, we discussed potential thromboembolic risk associated with atrial fibrillation. She has not been on any coagulation due to frequent nosebleeds requiring cauterization and does have CML. She did have a significant remote tobacco history of 2 packs per day for over 20 years. She did have nonspecific ST changes inferolaterally on her EKG. She subsequently was referred for a nuclear perfusion scan which was done on 07/05/2013. This was not gated to her atrial fibrillation and was a low risk study demonstrating mild artifact. There was no scar or ischemia. A 2-D echo Doppler study showed normal systolic function and Doppler parameters suggested increased mean left atrial pressure. She is systolic bowing of her mitral valve leaflet without definitive prolapse with mild MR. Her left atrium was moderate to severely dilated  and her right atrium was moderately dilated. There was mild pulmonary hypertension with PA pressure estimated at 34 mm.  I also recommended that she have laboratory done and this was done by Sara Bender PA-C. Hemoglobin was 14 hematocrit 42.3. Glucose was elevated at 176. Magnesium 2.1. Thyroid function study was normal. Total cholesterol 153 triglycerides 201 HDL 34 LDL 79. She presents to the office now for followup evaluation.  Past Medical History  Diagnosis Date  . Arrhythmia     chronic atrial fib  . Toxic goiter   . Ejection fraction < 50%   . Hypothyroidism   . Atrial fibrillation   . CHF (congestive heart failure)   . Elevated WBC count   . 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 Atrial fibrillation     Past Surgical History  Procedure Laterality Date  . Cystectomy    . Hysterotomy    . Cardioversion      No Known Allergies  Current Outpatient Prescriptions  Medication Sig Dispense Refill  . aspirin 325 MG EC tablet Take 325 mg by mouth daily.      . clorazepate (TRANXENE-T) 7.5 MG tablet Take 1 tablet (7.5 mg total) by mouth 2 (two) times daily as needed for anxiety.  30 tablet  1  . digoxin (LANOXIN) 0.25 MG tablet Take 1 tablet (250 mcg total) by mouth daily.  7 tablet  1  . furosemide (LASIX) 20 MG tablet Take 1 tablet (20 mg total) by mouth daily.  30 tablet  5  . Levothyroxine Sodium (SYNTHROID PO) Take 125 mcg by mouth daily.       . potassium  chloride SA (K-DUR,KLOR-CON) 20 MEQ tablet Take 1 tablet (20 mEq total) by mouth daily.  30 tablet  1  . carvedilol (COREG) 12.5 MG tablet Take 12.5 mg by mouth 2 (two) times daily with a meal. Add extra 1/2 pill twice daily with whole tablet, per patient      . TASIGNA 150 MG capsule TAKE 2 CAPSULES BY MOUTH EVERY 12 HOURS.  112 capsule  0  . [DISCONTINUED] lisinopril (ZESTRIL) 2.5 MG tablet Take 1 tablet (2.5 mg total) by mouth daily.  90 tablet  4   No current facility-administered medications for this visit.     Socially she is widowed for 23 years. She has 5 children 15 grandchildren and 17 great grandchildren. She lives at home with her 2 grandchildren. She is retired. She completed ninth grade of education. She is a former smoker but quit smoking in 1987 there prior to that she had smoked up to 2 packs per day for 20 years. There is no alcohol use.  Family History  Problem Relation Age of Onset  . Cancer - Colon Mother     mast to lungs  . Diabetes Father   . Diabetes Paternal Grandmother     ROS is negative for fever chills or night sweats. She denies any tremors. She denies visual symptoms. She does have a history of nosebleeds in the past on Coumadin. Her last cautery was approximate 5 years ago. She denies chest pressure. She does note shortness of breath. She is followed closely for her CML by oncology. She denies bleeding. She denies change in bowel or bladder habits. She does have varicose veins. She admits to intermittent leg swelling typically right leg. She denies paresthesias. There are no seizures. She denies any significant episodes of awareness of sleep disordered breathing. Other system review is negative.  PE BP 122/72  Pulse 80  Ht 5\' 8"  (1.727 m)  Wt 198 lb 8 oz (90.039 kg)  BMI 30.19 kg/m2 General: Alert, oriented, no distress.  Skin: normal turgor, no rashes HEENT: Normocephalic, atraumatic. Pupils round and reactive; sclera anicteric; Fundi without hemorrhages or exudates. Nose without nasal septal hypertrophy Mouth/Parynx benign; Mallinpatti scale 3 Neck: No JVD, no carotid briuts Lungs: clear to ausculatation and percussion; no wheezing or rales Heart: RRR, s1 s2 normal  Abdomen: soft, nontender; no hepatosplenomehaly, BS+; abdominal aorta nontender and not dilated by palpation. Pulses 2+ Extremities: Mild varicosities, trace right lower extremity edema, no clubbinbg cyanosis, Homan's sign negative  Neurologic: grossly nonfocal   LABS:  BMET    Component  Value Date/Time   NA 139 08/09/2013 1231   NA 139 05/24/2012 1004   K 4.0 08/09/2013 1231   K 4.7 05/24/2012 1004   CL 106 04/26/2013 1252   CL 104 05/24/2012 1004   CO2 28 08/09/2013 1231   CO2 26 05/24/2012 1004   GLUCOSE 235* 08/09/2013 1231   GLUCOSE 177* 04/26/2013 1252   GLUCOSE 124* 05/24/2012 1004   BUN 12.2 08/09/2013 1231   BUN 17 05/24/2012 1004   CREATININE 1.3* 08/09/2013 1231   CREATININE 0.82 05/24/2012 1004   CALCIUM 9.3 08/09/2013 1231   CALCIUM 9.4 05/24/2012 1004     Hepatic Function Panel     Component Value Date/Time   PROT 6.8 08/09/2013 1231   PROT 6.5 05/24/2012 1004   ALBUMIN 3.8 08/09/2013 1231   ALBUMIN 4.6 05/24/2012 1004   AST 13 08/09/2013 1231   AST 15 05/24/2012 1004   ALT 18 08/09/2013 1231  ALT 19 05/24/2012 1004   ALKPHOS 51 08/09/2013 1231   ALKPHOS 38* 05/24/2012 1004   BILITOT 0.90 08/09/2013 1231   BILITOT 0.5 05/24/2012 1004     CBC    Component Value Date/Time   WBC 10.8* 08/09/2013 1231   WBC 50.6* 10/24/2012 0755   RBC 4.95 08/09/2013 1231   RBC 4.61 10/24/2012 0755   HGB 14.2 08/09/2013 1231   HGB 13.9 10/24/2012 0755   HCT 42.2 08/09/2013 1231   HCT 42.1 10/24/2012 0755   PLT 297 08/09/2013 1231   PLT 420* 10/24/2012 0755   MCV 85.4 08/09/2013 1231   MCV 91.3 10/24/2012 0755   MCH 28.6 08/09/2013 1231   MCH 30.2 10/24/2012 0755   MCHC 33.5 08/09/2013 1231   MCHC 33.0 10/24/2012 0755   RDW 14.6* 08/09/2013 1231   RDW 15.7* 10/24/2012 0755   LYMPHSABS 3.2 08/09/2013 1231   MONOABS 0.7 08/09/2013 1231   EOSABS 0.3 08/09/2013 1231   BASOSABS 0.2* 08/09/2013 1231     BNP No results found for this basename: probnp    Lipid Panel  No results found for this basename: chol,  trig,  hdl,  cholhdl,  vldl,  ldlcalc     RADIOLOGY: No results found.   ASSESSMENT AND PLAN: Ms. Casteneda is a 72 year old female with a history of permanent atrial fibrillation. She  is not on anticoagulation due to history of frequent nosebleeds in the past requiring  cauterization. We did discuss the potential for CVA risk with atrial fibrillation compounding over the years. She is on aspirin.  She also has been diagnosed with CML and is undergoing treatment. I reviewed her nuclear study and echo Doppler data with her in detail. The nuclear study arteries against an ischemic etiology to her ECG changes. She did have a remote 20 year history of tobacco use. Her echo Doppler study shows preserved global contractility with suggestion of mild tissue Doppler abnormality. There was mild mitral regurgitation. She had biatrial enlargement. Presently, I am recommending further titration of her carvedilol dose to 18.75 mg twice a day for improved rate control. Her blood pressure is stable. She is on aspirin alone instead of anticoagulation. I will see her in 6 months for followup evaluation and prior to that office visit laboratory consisting of  comprehensive metabolic panel, hemoglobin A1c, as well as NMR LipoProfile will be obtained.   Troy Sine, MD, [facility] 08/18/2013 6:25 PM

## 2013-09-11 ENCOUNTER — Other Ambulatory Visit: Payer: Self-pay | Admitting: Oncology

## 2013-09-26 ENCOUNTER — Telehealth: Payer: Self-pay | Admitting: *Deleted

## 2013-09-26 NOTE — Telephone Encounter (Signed)
Pt asked if we completed Diabetic shoe paperwork.  I left message that I would present paperworkd to Dr Amalia Hailey and begin the process, if she met the criteria.  I also asked her to leave the doctor's name and phone number that manages her diabetes.

## 2013-10-02 ENCOUNTER — Telehealth: Payer: Self-pay | Admitting: *Deleted

## 2013-10-02 NOTE — Telephone Encounter (Signed)
Pt called states " I just want to let Dr. Alen Blew know I've been diagnosed with Diabetes. My sugars have been Sat pm-216, sun am-211  Pm-122, today (am) 155. I'm taking Metformin 500mg  2pills at night/dinner." Message forward to MD for review.

## 2013-10-02 NOTE — Telephone Encounter (Signed)
Pt asked how to go about getting diabetic shoes.  I asked if she got my message of 09/26/2013, she states yes.  I told her I have given the paperwork to Dr Amalia Hailey and will begin process as soon as I get the paperwork.

## 2013-10-13 ENCOUNTER — Other Ambulatory Visit: Payer: Self-pay | Admitting: Oncology

## 2013-11-06 ENCOUNTER — Telehealth: Payer: Self-pay | Admitting: *Deleted

## 2013-11-06 ENCOUNTER — Other Ambulatory Visit: Payer: Self-pay | Admitting: Oncology

## 2013-11-06 NOTE — Telephone Encounter (Signed)
Pt wants to know about diabetic shoes and can she be measured and molded in [place].

## 2013-11-06 NOTE — Telephone Encounter (Signed)
Patient in office for cleaning and is on Tasigna daily. Need clearance to clean her teeth. Made them aware her counts in Sept were good-gave WBC and platelet counts and that OK to clean teeth.

## 2013-11-07 NOTE — Telephone Encounter (Signed)
Paperwork has been signed off on!Pt requests appt in [place] for measuring.

## 2013-11-08 ENCOUNTER — Ambulatory Visit (HOSPITAL_BASED_OUTPATIENT_CLINIC_OR_DEPARTMENT_OTHER): Payer: Medicare HMO | Admitting: Oncology

## 2013-11-08 ENCOUNTER — Other Ambulatory Visit (HOSPITAL_BASED_OUTPATIENT_CLINIC_OR_DEPARTMENT_OTHER): Payer: Medicare HMO

## 2013-11-08 ENCOUNTER — Ambulatory Visit: Payer: Self-pay | Admitting: Podiatry

## 2013-11-08 ENCOUNTER — Telehealth: Payer: Self-pay | Admitting: Oncology

## 2013-11-08 VITALS — BP 124/61 | HR 70 | Temp 97.9°F | Resp 18 | Ht 68.0 in | Wt 194.0 lb

## 2013-11-08 DIAGNOSIS — C9210 Chronic myeloid leukemia, BCR/ABL-positive, not having achieved remission: Secondary | ICD-10-CM

## 2013-11-08 DIAGNOSIS — E039 Hypothyroidism, unspecified: Secondary | ICD-10-CM

## 2013-11-08 DIAGNOSIS — F411 Generalized anxiety disorder: Secondary | ICD-10-CM

## 2013-11-08 LAB — CBC WITH DIFFERENTIAL/PLATELET
Eosinophils Absolute: 0.3 10*3/uL (ref 0.0–0.5)
HCT: 41.1 % (ref 34.8–46.6)
HGB: 13.4 g/dL (ref 11.6–15.9)
LYMPH%: 28.3 % (ref 14.0–49.7)
MONO#: 1.1 10*3/uL — ABNORMAL HIGH (ref 0.1–0.9)
NEUT#: 6.9 10*3/uL — ABNORMAL HIGH (ref 1.5–6.5)
NEUT%: 58.5 % (ref 38.4–76.8)
Platelets: 334 10*3/uL (ref 145–400)
RBC: 4.78 10*6/uL (ref 3.70–5.45)
WBC: 11.8 10*3/uL — ABNORMAL HIGH (ref 3.9–10.3)

## 2013-11-08 LAB — COMPREHENSIVE METABOLIC PANEL (CC13)
Anion Gap: 10 mEq/L (ref 3–11)
CO2: 27 mEq/L (ref 22–29)
Glucose: 104 mg/dl (ref 70–140)
Sodium: 143 mEq/L (ref 136–145)
Total Bilirubin: 0.93 mg/dL (ref 0.20–1.20)
Total Protein: 6.8 g/dL (ref 6.4–8.3)

## 2013-11-08 NOTE — Telephone Encounter (Signed)
appts made per 12/10 POF AVS and CAL given shh

## 2013-11-08 NOTE — Progress Notes (Signed)
Hematology and Oncology Follow Up Visit  Sara Warner [id number] Jan 07, 1941 72 y.o. 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 Diagnosed a bone marrow biopsy on 10/24/2012.   Current treatment: Tasigna 300 mg bid started 11/2012.   Interim History: Sara Warner presents today for a follow up visit with her daughter. She continues to do well with Tasigna without complications. Clinically she is asymptomatic she does not report any fevers, does not report any chills, does not report any lymphadenopathy. She has not had any constitutional symptoms. She does not report any back pain or bony pain. She has not really reported any major changes in her activity level. She is able to drive and again attend to most activities of daily living without any hindrance or decline. She has been diagnosed with diabetes currently on metformin.  Medications: I have reviewed the patient's current medications. Current Outpatient Prescriptions  Medication Sig Dispense Refill  . aspirin 325 MG EC tablet Take 325 mg by mouth daily.      . carvedilol (COREG) 12.5 MG tablet Take 12.5 mg by mouth 2 (two) times daily with a meal. Add extra 1/2 pill twice daily with whole tablet, per patient      . clorazepate (TRANXENE-T) 7.5 MG tablet Take 1 tablet (7.5 mg total) by mouth 2 (two) times daily as needed for anxiety.  30 tablet  1  . digoxin (LANOXIN) 0.25 MG tablet Take 1 tablet (250 mcg total) by mouth daily.  7 tablet  1  . furosemide (LASIX) 20 MG tablet Take 1 tablet (20 mg total) by mouth daily.  30 tablet  5  . Levothyroxine Sodium (SYNTHROID PO) Take 125 mcg by mouth daily.       . metFORMIN (GLUCOPHAGE) 500 MG tablet Take 1,000 mg by mouth daily. With the evening meal      . potassium chloride SA (K-DUR,KLOR-CON) 20 MEQ tablet Take 1 tablet (20 mEq total) by mouth daily.  30 tablet  1  . TASIGNA 150 MG capsule TAKE 2 CAPSULES BY MOUTH  EVERY 12 HOURS.  112 capsule  0  . [DISCONTINUED] lisinopril (ZESTRIL) 2.5 MG tablet Take 1 tablet (2.5 mg total) by mouth daily.  90 tablet  4   No current facility-administered medications for this visit.    Allergies: No Known Allergies  Past Medical History, Surgical history, Social history, and Family History were reviewed and updated.  Review of Systems:  Remaining ROS negative.  Physical Exam: Blood pressure 124/61, pulse 70, temperature 97.9 F (36.6 C), temperature source Oral, resp. rate 18, height 5\' 8"  (1.727 m), weight 194 lb (87.998 kg). ECOG: 1 General appearance: alert Head: Normocephalic, without obvious abnormality, atraumatic Neck: no adenopathy, no carotid bruit, no JVD, supple, symmetrical, trachea midline and thyroid not enlarged, symmetric, no tenderness/mass/nodules Lymph nodes: Cervical, supraclavicular, and axillary nodes normal. Heart:irregularly irregular rhythm Lung:chest clear, no wheezing, rales, normal symmetric air entry Abdomen: soft, non-tender, without masses or organomegaly EXT:no erythema, induration, or nodules   Lab Results: Lab Results  Component Value Date   WBC 11.8* 11/08/2013   HGB 13.4 11/08/2013   HCT 41.1 11/08/2013   MCV 86.0 11/08/2013   PLT 334 11/08/2013     Chemistry      Component Value Date/Time   NA 139 08/09/2013 1231   NA 139 05/24/2012 1004   K 4.0 08/09/2013 1231   K 4.7 05/24/2012 1004   CL 106 04/26/2013 1252  CL 104 05/24/2012 1004   CO2 28 08/09/2013 1231   CO2 26 05/24/2012 1004   BUN 12.2 08/09/2013 1231   BUN 17 05/24/2012 1004   CREATININE 1.3* 08/09/2013 1231   CREATININE 0.82 05/24/2012 1004      Component Value Date/Time   CALCIUM 9.3 08/09/2013 1231   CALCIUM 9.4 05/24/2012 1004   ALKPHOS 51 08/09/2013 1231   ALKPHOS 38* 05/24/2012 1004   AST 13 08/09/2013 1231   AST 15 05/24/2012 1004   ALT 18 08/09/2013 1231   ALT 19 05/24/2012 1004   BILITOT 0.90 08/09/2013 1231   BILITOT 0.5 05/24/2012 1004      Impression and Plan:   This is a pleasant 72 year old with:  1. CML. On Tasigna since 11/2012 with normalization of her her WBC. She is tolerating this medication well. Recommend that she proceed with Tasigna without dose modification. She has achieved a complete hematological response.   2. Hypothyroidism: On Synthroid. TSH was normal on 12/07/12.   3. Anxiety: Using Tranxene as needed.  4. Follow-up: In 4 months.      [id number] 12/10/20143:02 PM

## 2013-11-10 ENCOUNTER — Ambulatory Visit (INDEPENDENT_AMBULATORY_CARE_PROVIDER_SITE_OTHER): Payer: Medicare HMO | Admitting: *Deleted

## 2013-11-10 DIAGNOSIS — E1149 Type 2 diabetes mellitus with other diabetic neurological complication: Secondary | ICD-10-CM

## 2013-11-10 NOTE — Patient Instructions (Signed)
Our office will notify you once your diabetic shoes and insoles arrive. At that time an appointment will be needed to pick them up.  

## 2013-11-10 NOTE — Progress Notes (Signed)
Measured for diabetic shoes and insoles. 

## 2013-12-07 ENCOUNTER — Other Ambulatory Visit: Payer: Self-pay | Admitting: Oncology

## 2013-12-07 DIAGNOSIS — C9210 Chronic myeloid leukemia, BCR/ABL-positive, not having achieved remission: Secondary | ICD-10-CM

## 2013-12-20 ENCOUNTER — Ambulatory Visit (INDEPENDENT_AMBULATORY_CARE_PROVIDER_SITE_OTHER): Payer: Medicare HMO | Admitting: Podiatry

## 2013-12-20 ENCOUNTER — Encounter: Payer: Self-pay | Admitting: Podiatry

## 2013-12-20 VITALS — BP 156/82 | HR 64 | Resp 12

## 2013-12-20 DIAGNOSIS — M21619 Bunion of unspecified foot: Secondary | ICD-10-CM

## 2013-12-20 DIAGNOSIS — E1149 Type 2 diabetes mellitus with other diabetic neurological complication: Secondary | ICD-10-CM

## 2013-12-20 NOTE — Progress Notes (Signed)
Patient ID: Sara Warner, female   DOB: [birth year], 73 y.o.   MRN: [id number]  Subjective: This recently diagnosed diabetic patient presents for dispensing of diabetic shoes and custom insoles.  Objective: Previously noted decrease sensation to 10 g monofilament wire. Mild bunion deformities noted. Plantar hyperkeratoses noted sub-first MPJ bilaterally. There is some hemorrhagic bleeding in the plantar right first MPJ callus noted.  Assessment Diabetic with sensory neuropathy Bunion deformity Pre-ulcerative hyperkeratotic tissue plantar first MPJ right  Plan: Apex 10.5 A 200 wide shoes dispensed with custom molded foot orthotics x3 with pocket accommodation to offload the first MPJ were dispensed with wearing structure provided. The plantar keratoses were debrided today  Reappoint when necessary or at yearly intervals.

## 2014-03-07 ENCOUNTER — Telehealth: Payer: Self-pay | Admitting: Oncology

## 2014-03-07 ENCOUNTER — Other Ambulatory Visit (HOSPITAL_BASED_OUTPATIENT_CLINIC_OR_DEPARTMENT_OTHER): Payer: Commercial Managed Care - HMO

## 2014-03-07 ENCOUNTER — Encounter: Payer: Self-pay | Admitting: Oncology

## 2014-03-07 ENCOUNTER — Ambulatory Visit (HOSPITAL_BASED_OUTPATIENT_CLINIC_OR_DEPARTMENT_OTHER): Payer: Commercial Managed Care - HMO | Admitting: Oncology

## 2014-03-07 VITALS — BP 134/56 | HR 68 | Temp 98.1°F | Resp 18 | Wt 190.1 lb

## 2014-03-07 DIAGNOSIS — C9210 Chronic myeloid leukemia, BCR/ABL-positive, not having achieved remission: Secondary | ICD-10-CM

## 2014-03-07 DIAGNOSIS — E119 Type 2 diabetes mellitus without complications: Secondary | ICD-10-CM

## 2014-03-07 DIAGNOSIS — I428 Other cardiomyopathies: Secondary | ICD-10-CM

## 2014-03-07 LAB — CBC WITH DIFFERENTIAL/PLATELET
BASO%: 2.2 % — AB (ref 0.0–2.0)
Basophils Absolute: 0.2 10*3/uL — ABNORMAL HIGH (ref 0.0–0.1)
EOS%: 2.6 % (ref 0.0–7.0)
Eosinophils Absolute: 0.3 10*3/uL (ref 0.0–0.5)
HCT: 41.5 % (ref 34.8–46.6)
HGB: 13.4 g/dL (ref 11.6–15.9)
LYMPH#: 3.1 10*3/uL (ref 0.9–3.3)
LYMPH%: 30.2 % (ref 14.0–49.7)
MCH: 27.3 pg (ref 25.1–34.0)
MCHC: 32.2 g/dL (ref 31.5–36.0)
MCV: 84.7 fL (ref 79.5–101.0)
MONO#: 0.8 10*3/uL (ref 0.1–0.9)
MONO%: 7.3 % (ref 0.0–14.0)
NEUT#: 6 10*3/uL (ref 1.5–6.5)
NEUT%: 57.7 % (ref 38.4–76.8)
PLATELETS: 317 10*3/uL (ref 145–400)
RBC: 4.9 10*6/uL (ref 3.70–5.45)
RDW: 14.4 % (ref 11.2–14.5)
WBC: 10.3 10*3/uL (ref 3.9–10.3)

## 2014-03-07 LAB — COMPREHENSIVE METABOLIC PANEL (CC13)
ALT: 16 U/L (ref 0–55)
AST: 14 U/L (ref 5–34)
Albumin: 4.1 g/dL (ref 3.5–5.0)
Alkaline Phosphatase: 49 U/L (ref 40–150)
Anion Gap: 8 mEq/L (ref 3–11)
BILIRUBIN TOTAL: 0.88 mg/dL (ref 0.20–1.20)
BUN: 18.2 mg/dL (ref 7.0–26.0)
CO2: 27 meq/L (ref 22–29)
Calcium: 9.3 mg/dL (ref 8.4–10.4)
Chloride: 108 mEq/L (ref 98–109)
Creatinine: 1.1 mg/dL (ref 0.6–1.1)
Glucose: 129 mg/dl (ref 70–140)
POTASSIUM: 4 meq/L (ref 3.5–5.1)
SODIUM: 142 meq/L (ref 136–145)
TOTAL PROTEIN: 6.9 g/dL (ref 6.4–8.3)

## 2014-03-07 NOTE — Telephone Encounter (Signed)
Gave pt appt for lab and Md for September 2015

## 2014-03-07 NOTE — Progress Notes (Signed)
Hematology and Oncology Follow Up Visit  Sara Warner [id number] 02-06-1941 73 y.o. 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 Diagnosed a bone marrow biopsy on 10/24/2012.   Current treatment: Tasigna 300 mg bid started 11/2012.   Interim History: Sara Warner presents today for a follow up visit with her daughter. She continues to do well with Tasigna without complications. She has reported initially some hair loss and skin itching especially in her scalp. Clinically she is asymptomatic she does not report any fevers, does not report any chills, does not report any lymphadenopathy. She has not had any constitutional symptoms. She does not report any back pain or bony pain. She has not really reported any major changes in her activity level. She is able to drive and again attend to most activities of daily living without any hindrance or decline. She has been diagnosed with diabetes currently on metformin. She has not reported any recent hospitalizations or illnesses. She did not report any new complications since her last visit.  Medications: I have reviewed the patient's current medications. Current Outpatient Prescriptions  Medication Sig Dispense Refill  . aspirin 325 MG EC tablet Take 325 mg by mouth daily.      . carvedilol (COREG) 12.5 MG tablet Take 12.5 mg by mouth 2 (two) times daily with a meal. Add extra 1/2 pill twice daily with whole tablet, per patient      . clorazepate (TRANXENE-T) 7.5 MG tablet Take 1 tablet (7.5 mg total) by mouth 2 (two) times daily as needed for anxiety.  30 tablet  1  . digoxin (LANOXIN) 0.25 MG tablet Take 1 tablet (250 mcg total) by mouth daily.  7 tablet  1  . furosemide (LASIX) 20 MG tablet Take 1 tablet (20 mg total) by mouth daily.  30 tablet  5  . Levothyroxine Sodium (SYNTHROID PO) Take 125 mcg by mouth daily.       . metFORMIN (GLUCOPHAGE) 500 MG tablet Take 1,000 mg by  mouth 2 (two) times daily with a meal. With the evening meal      . potassium chloride SA (K-DUR,KLOR-CON) 20 MEQ tablet Take 1 tablet (20 mEq total) by mouth daily.  30 tablet  1  . TASIGNA 150 MG capsule TAKE 2 CAPSULES BY MOUTH EVERY 12 HOURS.  120 capsule  3  . [DISCONTINUED] lisinopril (ZESTRIL) 2.5 MG tablet Take 1 tablet (2.5 mg total) by mouth daily.  90 tablet  4   No current facility-administered medications for this visit.    Allergies: No Known Allergies  Past Medical History, Surgical history, Social history, and Family History were reviewed and updated.  Review of Systems:  Remaining ROS negative.  Physical Exam: Blood pressure 134/56, pulse 68, temperature 98.1 F (36.7 C), temperature source Oral, resp. rate 18, weight 190 lb 2 oz (86.24 kg). ECOG: 1 General appearance: alert awake appeared in no active distress. Head: Normocephalic, without obvious abnormality, atraumatic Neck: no adenopathy, no carotid bruit, no JVD, supple, symmetrical, trachea midline and thyroid not enlarged, symmetric, no tenderness/mass/nodules Lymph nodes: Cervical, supraclavicular, and axillary nodes normal. Heart:irregularly irregular rhythm no murmurs. Lung:chest clear, no wheezing, rales, normal symmetric air entry. Abdomen: soft, non-tender, without masses or organomegaly EXT:no erythema, induration, or nodules Skin: No rashes or lesions.  Lab Results: Lab Results  Component Value Date   WBC 10.3 03/07/2014   HGB 13.4 03/07/2014   HCT 41.5 03/07/2014   MCV 84.7  03/07/2014   PLT 317 03/07/2014     Chemistry      Component Value Date/Time   NA 143 11/08/2013 1440   NA 139 05/24/2012 1004   K 4.2 11/08/2013 1440   K 4.7 05/24/2012 1004   CL 106 04/26/2013 1252   CL 104 05/24/2012 1004   CO2 27 11/08/2013 1440   CO2 26 05/24/2012 1004   BUN 13.1 11/08/2013 1440   BUN 17 05/24/2012 1004   CREATININE 0.8 11/08/2013 1440   CREATININE 0.82 05/24/2012 1004      Component Value Date/Time    CALCIUM 9.4 11/08/2013 1440   CALCIUM 9.4 05/24/2012 1004   ALKPHOS 48 11/08/2013 1440   ALKPHOS 38* 05/24/2012 1004   AST 17 11/08/2013 1440   AST 15 05/24/2012 1004   ALT 24 11/08/2013 1440   ALT 19 05/24/2012 1004   BILITOT 0.93 11/08/2013 1440   BILITOT 0.5 05/24/2012 1004         Impression and Plan:   This is a pleasant 73 year old with:  1. CML. On Tasigna since 11/2012 with normalization of her her WBC. She is tolerating this medication well. Recommend that she proceed with Tasigna without dose modification. She has achieved a complete hematological response. I will repeat her peripheral blood BCR/ABL with the next visit. We will repeat her bone marrow biopsy as needed.  2. Diabetes mellitus: She is currently on metformin without any complications.  3. cardiomyopathy: There is no stigmata of congestive heart failure at this point. There is no evidence of exacerbation related to Tasigna.   4. Follow-up: In 5 months.      Sara Warner 4/8/20151:17 PM

## 2014-03-29 ENCOUNTER — Other Ambulatory Visit: Payer: Self-pay | Admitting: Oncology

## 2014-03-29 DIAGNOSIS — C9210 Chronic myeloid leukemia, BCR/ABL-positive, not having achieved remission: Secondary | ICD-10-CM

## 2014-05-07 ENCOUNTER — Encounter: Payer: Self-pay | Admitting: Podiatry

## 2014-05-07 ENCOUNTER — Ambulatory Visit (INDEPENDENT_AMBULATORY_CARE_PROVIDER_SITE_OTHER): Payer: Medicare HMO | Admitting: Podiatry

## 2014-05-07 VITALS — BP 135/70 | HR 64 | Resp 12

## 2014-05-07 DIAGNOSIS — E1149 Type 2 diabetes mellitus with other diabetic neurological complication: Secondary | ICD-10-CM

## 2014-05-07 DIAGNOSIS — L84 Corns and callosities: Secondary | ICD-10-CM

## 2014-05-07 NOTE — Patient Instructions (Signed)
Wear diabetic shoes with diabetic innersoles at all times Apply Vaseline to callus areas on the ball area of the right and left feet Wear white cotton socks

## 2014-05-08 ENCOUNTER — Ambulatory Visit: Payer: Medicare HMO

## 2014-05-08 NOTE — Progress Notes (Signed)
Patient ID: Sara Warner, female   DOB: Sep 29, 1941, 73 y.o.   MRN: [id number]  Subjective: Patient presents for followup care for callus there is plantar first MPJ bilaterally. She is now wearing custom foot orthotics in diabetic shoes on a regular basis  Objective: Hemorrhagic keratoses plantar keratoses plantar left first MPJ that remains closed after debridement Plantar keratoses right first MPJ without bleeding  Assessment: Pre-ulcerative hyperkeratoses plantar left first MPJ Plantar keratoses right first MPJ  Plan: Keratoses x2 were debrided  Patient is advised to wear diabetic shoes with custom foot orthotics ongoing daily basis I also advised that she apply Vaseline to the plantar calluses daily and wear white cotton socks. I advised her not to trim my calluses herself  Reappoint x3 months

## 2014-07-19 ENCOUNTER — Other Ambulatory Visit: Payer: Self-pay | Admitting: *Deleted

## 2014-07-19 MED ORDER — FUROSEMIDE 20 MG PO TABS: 20.0000 mg | ORAL_TABLET | Freq: Every day | ORAL | Status: DC

## 2014-07-19 NOTE — Telephone Encounter (Signed)
Rx was sent to pharmacy electronically. 

## 2014-07-26 ENCOUNTER — Telehealth: Payer: Self-pay | Admitting: Oncology

## 2014-07-26 NOTE — Telephone Encounter (Signed)
Lft msg r/s labs/ov per MD, mailed sch to pt...KJ

## 2014-08-08 ENCOUNTER — Ambulatory Visit (INDEPENDENT_AMBULATORY_CARE_PROVIDER_SITE_OTHER): Payer: Medicare HMO | Admitting: Podiatry

## 2014-08-08 ENCOUNTER — Encounter: Payer: Self-pay | Admitting: Podiatry

## 2014-08-08 VITALS — BP 121/66 | HR 78 | Resp 12

## 2014-08-08 DIAGNOSIS — L84 Corns and callosities: Secondary | ICD-10-CM

## 2014-08-08 DIAGNOSIS — E1149 Type 2 diabetes mellitus with other diabetic neurological complication: Secondary | ICD-10-CM

## 2014-08-09 NOTE — Progress Notes (Signed)
Patient ID: Sara Warner, female   DOB: February 19, 1941, 73 y.o.   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

## 2014-08-16 ENCOUNTER — Telehealth: Payer: Self-pay | Admitting: Oncology

## 2014-08-16 NOTE — Telephone Encounter (Signed)
Lft msg for pt advising of updated schedule mailing out schedule...KJ

## 2014-08-21 ENCOUNTER — Other Ambulatory Visit: Payer: Medicare HMO

## 2014-08-21 ENCOUNTER — Ambulatory Visit: Payer: Medicare HMO | Admitting: Oncology

## 2014-08-24 ENCOUNTER — Other Ambulatory Visit: Payer: Self-pay | Admitting: Oncology

## 2014-08-24 DIAGNOSIS — C9210 Chronic myeloid leukemia, BCR/ABL-positive, not having achieved remission: Secondary | ICD-10-CM

## 2014-08-27 ENCOUNTER — Ambulatory Visit: Payer: Self-pay | Admitting: Oncology

## 2014-08-27 ENCOUNTER — Other Ambulatory Visit: Payer: Commercial Managed Care - HMO

## 2014-09-18 ENCOUNTER — Encounter: Payer: Self-pay | Admitting: Oncology

## 2014-09-18 ENCOUNTER — Other Ambulatory Visit (HOSPITAL_BASED_OUTPATIENT_CLINIC_OR_DEPARTMENT_OTHER): Payer: Commercial Managed Care - HMO

## 2014-09-18 ENCOUNTER — Ambulatory Visit (HOSPITAL_BASED_OUTPATIENT_CLINIC_OR_DEPARTMENT_OTHER): Payer: Commercial Managed Care - HMO | Admitting: Oncology

## 2014-09-18 ENCOUNTER — Telehealth: Payer: Self-pay | Admitting: Oncology

## 2014-09-18 VITALS — BP 120/52 | HR 69 | Temp 98.0°F | Resp 18 | Ht 68.0 in | Wt 198.8 lb

## 2014-09-18 DIAGNOSIS — C9290 Myeloid leukemia, unspecified, not having achieved remission: Secondary | ICD-10-CM

## 2014-09-18 DIAGNOSIS — C9210 Chronic myeloid leukemia, BCR/ABL-positive, not having achieved remission: Secondary | ICD-10-CM

## 2014-09-18 DIAGNOSIS — E119 Type 2 diabetes mellitus without complications: Secondary | ICD-10-CM

## 2014-09-18 LAB — CBC WITH DIFFERENTIAL/PLATELET
BASO%: 1.4 % (ref 0.0–2.0)
Basophils Absolute: 0.1 10*3/uL (ref 0.0–0.1)
EOS%: 2.3 % (ref 0.0–7.0)
Eosinophils Absolute: 0.2 10*3/uL (ref 0.0–0.5)
HCT: 39.7 % (ref 34.8–46.6)
HGB: 12.4 g/dL (ref 11.6–15.9)
LYMPH%: 30.4 % (ref 14.0–49.7)
MCH: 26.6 pg (ref 25.1–34.0)
MCHC: 31.2 g/dL — AB (ref 31.5–36.0)
MCV: 85.5 fL (ref 79.5–101.0)
MONO#: 0.4 10*3/uL (ref 0.1–0.9)
MONO%: 4.7 % (ref 0.0–14.0)
NEUT#: 5.2 10*3/uL (ref 1.5–6.5)
NEUT%: 61.2 % (ref 38.4–76.8)
PLATELETS: 288 10*3/uL (ref 145–400)
RBC: 4.65 10*6/uL (ref 3.70–5.45)
RDW: 14.7 % — AB (ref 11.2–14.5)
WBC: 8.5 10*3/uL (ref 3.9–10.3)
lymph#: 2.6 10*3/uL (ref 0.9–3.3)

## 2014-09-18 LAB — COMPREHENSIVE METABOLIC PANEL (CC13)
ALK PHOS: 43 U/L (ref 40–150)
ALT: 23 U/L (ref 0–55)
AST: 17 U/L (ref 5–34)
Albumin: 3.6 g/dL (ref 3.5–5.0)
Anion Gap: 9 mEq/L (ref 3–11)
BILIRUBIN TOTAL: 1.03 mg/dL (ref 0.20–1.20)
BUN: 19.8 mg/dL (ref 7.0–26.0)
CO2: 25 mEq/L (ref 22–29)
Calcium: 8.6 mg/dL (ref 8.4–10.4)
Chloride: 109 mEq/L (ref 98–109)
Creatinine: 1.3 mg/dL — ABNORMAL HIGH (ref 0.6–1.1)
Glucose: 215 mg/dl — ABNORMAL HIGH (ref 70–140)
POTASSIUM: 4.2 meq/L (ref 3.5–5.1)
SODIUM: 144 meq/L (ref 136–145)
TOTAL PROTEIN: 6 g/dL — AB (ref 6.4–8.3)

## 2014-09-18 NOTE — Progress Notes (Signed)
Hematology and Oncology Follow Up Visit  Sara Warner [id number] 1941/08/25 73 y.o. 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 Diagnosed a bone marrow biopsy on 10/24/2012.   Current treatment: Tasigna 300 mg bid started 11/2012 and she has complete hematological response.   Interim History: [name] presents today for a follow up visit by her self. Since her last visit, she complains of right shoulder pain but otherwise doing well. She continues to do well with Tasigna without complications. She is not reporting any further hair loss or any new complaints. Clinically she is asymptomatic she does not report any fevers, does not report any chills, does not report any lymphadenopathy. She has not had any constitutional symptoms. She does not report any back pain or bony pain. She has not really reported any major changes in her activity level. She is able to drive and again attend to most activities of daily living without any hindrance or decline. She continues to drive and be independent. She has not reported any recent hospitalizations or illnesses. She did not report any new complications since her last visit.  Medications: I have reviewed the patient's current medications. Current Outpatient Prescriptions  Medication Sig Dispense Refill  . aspirin 325 MG EC tablet Take 325 mg by mouth daily.      . carvedilol (COREG) 12.5 MG tablet Take 12.5 mg by mouth 2 (two) times daily with a meal. Add extra 1/2 pill twice daily with whole tablet, per patient      . clorazepate (TRANXENE-T) 7.5 MG tablet Take 1 tablet (7.5 mg total) by mouth 2 (two) times daily as needed for anxiety.  30 tablet  1  . digoxin (LANOXIN) 0.25 MG tablet Take 250 mcg by mouth daily. Patient takes 1/2 tablet daily      . furosemide (LASIX) 20 MG tablet Take 1 tablet (20 mg total) by mouth daily.  30 tablet  1  . glipiZIDE (GLUCOTROL XL) 10 MG  24 hr tablet Take 10 mg by mouth 2 (two) times daily.      . Levothyroxine Sodium (SYNTHROID PO) Take 125 mcg by mouth daily.       Marland Kitchen TASIGNA 150 MG capsule TAKE 2 CAPSULES BY MOUTH EVERY 12 HOURS.  112 capsule  0  . potassium chloride SA (K-DUR,KLOR-CON) 20 MEQ tablet Take 1 tablet (20 mEq total) by mouth daily.  30 tablet  1  . [DISCONTINUED] lisinopril (ZESTRIL) 2.5 MG tablet Take 1 tablet (2.5 mg total) by mouth daily.  90 tablet  4   No current facility-administered medications for this visit.    Allergies: No Known Allergies  Past Medical History, Surgical history, Social history, and Family History were reviewed and updated.   Physical Exam: Blood pressure 120/52, pulse 69, temperature 98 F (36.7 C), temperature source Oral, resp. rate 18, height $RemoveBe'5\' 8"'TssxkCzyD$  (1.727 m), weight 198 lb 12.8 oz (90.175 kg). ECOG: 1 General appearance: alert awake appeared in no active distress. Head: Normocephalic, without obvious abnormality, atraumatic Neck: no adenopathy Lymph nodes: Cervical, supraclavicular, and axillary nodes normal. Heart:irregularly irregular rhythm no murmurs. Lung:chest clear, no wheezing, rales, normal symmetric air entry. Abdomen: soft, non-tender, without masses or organomegaly EXT:no erythema, induration, or nodules Skin: No rashes or lesions.  Lab Results: Lab Results  Component Value Date   WBC 8.5 09/18/2014   HGB 12.4 09/18/2014   HCT 39.7 09/18/2014   MCV 85.5 09/18/2014   PLT 288  09/18/2014     Chemistry      Component Value Date/Time   NA 142 03/07/2014 1249   NA 139 05/24/2012 1004   K 4.0 03/07/2014 1249   K 4.7 05/24/2012 1004   CL 106 04/26/2013 1252   CL 104 05/24/2012 1004   CO2 27 03/07/2014 1249   CO2 26 05/24/2012 1004   BUN 18.2 03/07/2014 1249   BUN 17 05/24/2012 1004   CREATININE 1.1 03/07/2014 1249   CREATININE 0.82 05/24/2012 1004      Component Value Date/Time   CALCIUM 9.3 03/07/2014 1249   CALCIUM 9.4 05/24/2012 1004   ALKPHOS 49 03/07/2014 1249    ALKPHOS 38* 05/24/2012 1004   AST 14 03/07/2014 1249   AST 15 05/24/2012 1004   ALT 16 03/07/2014 1249   ALT 19 05/24/2012 1004   BILITOT 0.88 03/07/2014 1249   BILITOT 0.5 05/24/2012 1004         Impression and Plan:   This is a pleasant 73 year old with:  1. CML. On Tasigna since 11/2012 with normalization of her her WBC. She is tolerating this medication well. She has achieved a complete hematological response. I will repeat her peripheral blood BCR/ABL today. We will repeat her bone marrow biopsy as needed. The plan is to continue on the current dose and schedule of Tasigna.  2. Diabetes mellitus: She is currently on metformin without any complications.  3. Cardiomyopathy: There is no stigmata of congestive heart failure at this point. There is no evidence of exacerbation related to Tasigna.   4. Follow-up: In 6 months.      [name] 10/20/201512:03 PM

## 2014-09-18 NOTE — Telephone Encounter (Signed)
Pt confirmed labs/ov per 10/20 POF, gave pt AVS.... KJ °

## 2014-09-24 ENCOUNTER — Other Ambulatory Visit: Payer: Self-pay | Admitting: Oncology

## 2014-10-17 ENCOUNTER — Encounter: Payer: Self-pay | Admitting: Podiatry

## 2014-10-17 ENCOUNTER — Ambulatory Visit (INDEPENDENT_AMBULATORY_CARE_PROVIDER_SITE_OTHER): Payer: Medicare HMO | Admitting: Podiatry

## 2014-10-17 DIAGNOSIS — L84 Corns and callosities: Secondary | ICD-10-CM

## 2014-10-17 DIAGNOSIS — M79676 Pain in unspecified toe(s): Secondary | ICD-10-CM

## 2014-10-17 DIAGNOSIS — B353 Tinea pedis: Secondary | ICD-10-CM

## 2014-10-17 DIAGNOSIS — B351 Tinea unguium: Secondary | ICD-10-CM

## 2014-10-17 NOTE — Patient Instructions (Addendum)
Use over-the-counter Lotrimin cream Rubbing to the skin on the right foot and in between toes daily 30 days

## 2014-10-17 NOTE — Progress Notes (Signed)
Patient ID: Sara Warner, female   DOB: 01/21/1941, 73 y.o.   MRN: [id number]  Subjective: This patient presents today complaining of painful toenails and a painful plantar callus  Objective: Hemorrhagic plantar callus right first MPJ that remains closed after debridement Dry plantar scaling right with interdigital skin scaling right The toenails are elongated, incurvated, discolored 6-10  Assessment: Symptomatic onychomycoses 6-10 Pre-ulcerative plantar keratoses right Tinea pedis right  Plan: Nails 10 and keratoses 1 debrided without a bleeding Continue to wear diabetic shoes Rx over-the-counter Lotrimin cream applied daily to plantar skin the interdigital spaces right 30 days

## 2014-10-24 ENCOUNTER — Encounter: Payer: Self-pay | Admitting: *Deleted

## 2014-10-24 ENCOUNTER — Other Ambulatory Visit: Payer: Self-pay | Admitting: Oncology

## 2014-10-24 NOTE — Telephone Encounter (Signed)
Patient called stating needed refill or Tasigna. Per MD ok to refill. Patient informed.

## 2014-10-24 NOTE — Progress Notes (Signed)
ESCRIBE REFILL REQUEST FROM [facility] FOR TASIGNA WAS PRINTED AND PLACED IN DR.SHADAD'S ACTIVE WORK FOLDER.

## 2014-11-26 ENCOUNTER — Other Ambulatory Visit: Payer: Self-pay | Admitting: Oncology

## 2014-12-24 ENCOUNTER — Other Ambulatory Visit: Payer: Self-pay | Admitting: Oncology

## 2014-12-25 ENCOUNTER — Other Ambulatory Visit: Payer: Self-pay | Admitting: *Deleted

## 2014-12-25 MED ORDER — NILOTINIB HCL 150 MG PO CAPS: 300.0000 mg | ORAL_CAPSULE | Freq: Two times a day (BID) | ORAL | Status: DC

## 2015-01-16 ENCOUNTER — Ambulatory Visit (INDEPENDENT_AMBULATORY_CARE_PROVIDER_SITE_OTHER): Payer: Commercial Managed Care - HMO | Admitting: Podiatry

## 2015-01-16 ENCOUNTER — Encounter: Payer: Self-pay | Admitting: Podiatry

## 2015-01-16 DIAGNOSIS — L84 Corns and callosities: Secondary | ICD-10-CM

## 2015-01-16 DIAGNOSIS — E1142 Type 2 diabetes mellitus with diabetic polyneuropathy: Secondary | ICD-10-CM

## 2015-01-16 DIAGNOSIS — G629 Polyneuropathy, unspecified: Secondary | ICD-10-CM

## 2015-01-16 NOTE — Patient Instructions (Signed)
Obtain medical certification for diabetic shoes today Diabetic foot exam confirms diabetic peripheral neuropathy Bleeding callus pre-ulcerative left foot Wear existing diabetic insoles with the additional felt pad attached to the insole on the left Diabetes and Foot Care Diabetes may cause you to have problems because of poor blood supply (circulation) to your feet and legs. This may cause the skin on your feet to become thinner, break easier, and heal more slowly. Your skin may become dry, and the skin may peel and crack. You may also have nerve damage in your legs and feet causing decreased feeling in them. You may not notice minor injuries to your feet that could lead to infections or more serious problems. Taking care of your feet is one of the most important things you can do for yourself.  HOME CARE INSTRUCTIONS  Wear shoes at all times, even in the house. Do not go barefoot. Bare feet are easily injured.  Check your feet daily for blisters, cuts, and redness. If you cannot see the bottom of your feet, use a mirror or ask someone for help.  Wash your feet with warm water (do not use hot water) and mild soap. Then pat your feet and the areas between your toes until they are completely dry. Do not soak your feet as this can dry your skin.  Apply a moisturizing lotion or petroleum jelly (that does not contain alcohol and is unscented) to the skin on your feet and to dry, brittle toenails. Do not apply lotion between your toes.  Trim your toenails straight across. Do not dig under them or around the cuticle. File the edges of your nails with an emery board or nail file.  Do not cut corns or calluses or try to remove them with medicine.  Wear clean socks or stockings every day. Make sure they are not too tight. Do not wear knee-high stockings since they may decrease blood flow to your legs.  Wear shoes that fit properly and have enough cushioning. To break in new shoes, wear them for just a  few hours a day. This prevents you from injuring your feet. Always look in your shoes before you put them on to be sure there are no objects inside.  Do not cross your legs. This may decrease the blood flow to your feet.  If you find a minor scrape, cut, or break in the skin on your feet, keep it and the skin around it clean and dry. These areas may be cleansed with mild soap and water. Do not cleanse the area with peroxide, alcohol, or iodine.  When you remove an adhesive bandage, be sure not to damage the skin around it.  If you have a wound, look at it several times a day to make sure it is healing.  Do not use heating pads or hot water bottles. They may burn your skin. If you have lost feeling in your feet or legs, you may not know it is happening until it is too late.  Make sure your health care provider performs a complete foot exam at least annually or more often if you have foot problems. Report any cuts, sores, or bruises to your health care provider immediately. SEEK MEDICAL CARE IF:   You have an injury that is not healing.  You have cuts or breaks in the skin.  You have an ingrown nail.  You notice redness on your legs or feet.  You feel burning or tingling in your legs or feet.  You have pain or cramps in your legs and feet.  Your legs or feet are numb.  Your feet always feel cold. SEEK IMMEDIATE MEDICAL CARE IF:   There is increasing redness, swelling, or pain in or around a wound.  There is a red line that goes up your leg.  Pus is coming from a wound.  You develop a fever or as directed by your health care provider.  You notice a bad smell coming from an ulcer or wound. Document Released: 11/13/2000 Document Revised: 07/19/2013 Document Reviewed: 04/25/2013 [facility] Patient Information 2015 [place], [place]. This information is not intended to replace advice given to you by your health care provider. Make sure you discuss any questions you have with your  health care provider.

## 2015-01-16 NOTE — Progress Notes (Signed)
   Subjective:    Patient ID: Sara Warner, female    DOB: 1941/11/22, 74 y.o.   MRN: [id number]  HPI This patient presents today complaining of plantar calyces sub-first MPJ bilaterally and is requesting debridement. She also is requesting replacement diabetic shoes. Diabetic shoes and custom insoles were dispensed approximately January 2015   Review of Systems  All other systems reviewed and are negative.      Objective:   Physical Exam  Orientated 3  Vascular: DP pulses 2/4 bilaterally PT pulses 2/4 bilaterally Capillary reflex immediate bilaterally  Neurological: Sensation to 10 g monofilament wire intact 0/5 right and 1/5 left Vibratory sensation nonreactive bilaterally Ankle reflexes reactive bilaterally  Dermatological: Bleeding callus sub-right first MPJ Plantar callus sub-left first MPJ The toenails are hypertrophic, incurvated and appear trimmed recently  Musculoskeletal: HAV deformity right       Assessment & Plan:   Assessment: Diabetic peripheral neuropathy with decreased sensation to 10 g monofilament wire and nonreactive vibratory sensation, bilaterally Pre-ulcerative plantar callus right 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 Lisbeth Ply at Hand 2 months

## 2015-01-22 ENCOUNTER — Other Ambulatory Visit: Payer: Self-pay | Admitting: Oncology

## 2015-02-19 ENCOUNTER — Other Ambulatory Visit: Payer: Self-pay | Admitting: Oncology

## 2015-03-20 ENCOUNTER — Ambulatory Visit (HOSPITAL_BASED_OUTPATIENT_CLINIC_OR_DEPARTMENT_OTHER): Payer: Commercial Managed Care - HMO | Admitting: Oncology

## 2015-03-20 ENCOUNTER — Telehealth: Payer: Self-pay | Admitting: Oncology

## 2015-03-20 ENCOUNTER — Other Ambulatory Visit (HOSPITAL_BASED_OUTPATIENT_CLINIC_OR_DEPARTMENT_OTHER): Payer: Commercial Managed Care - HMO

## 2015-03-20 VITALS — BP 149/94 | HR 84 | Temp 98.2°F | Resp 18 | Ht 68.0 in | Wt 198.2 lb

## 2015-03-20 DIAGNOSIS — D72829 Elevated white blood cell count, unspecified: Secondary | ICD-10-CM

## 2015-03-20 DIAGNOSIS — E119 Type 2 diabetes mellitus without complications: Secondary | ICD-10-CM

## 2015-03-20 DIAGNOSIS — C9290 Myeloid leukemia, unspecified, not having achieved remission: Secondary | ICD-10-CM

## 2015-03-20 DIAGNOSIS — I429 Cardiomyopathy, unspecified: Secondary | ICD-10-CM

## 2015-03-20 DIAGNOSIS — C9210 Chronic myeloid leukemia, BCR/ABL-positive, not having achieved remission: Secondary | ICD-10-CM

## 2015-03-20 LAB — COMPREHENSIVE METABOLIC PANEL (CC13)
ALK PHOS: 63 U/L (ref 40–150)
ALT: 28 U/L (ref 0–55)
AST: 22 U/L (ref 5–34)
Albumin: 4.2 g/dL (ref 3.5–5.0)
Anion Gap: 14 mEq/L — ABNORMAL HIGH (ref 3–11)
BILIRUBIN TOTAL: 0.96 mg/dL (ref 0.20–1.20)
BUN: 14.3 mg/dL (ref 7.0–26.0)
CALCIUM: 8.9 mg/dL (ref 8.4–10.4)
CO2: 22 meq/L (ref 22–29)
CREATININE: 0.8 mg/dL (ref 0.6–1.1)
Chloride: 105 mEq/L (ref 98–109)
EGFR: 69 mL/min/{1.73_m2} — ABNORMAL LOW (ref >90)
GLUCOSE: 183 mg/dL — AB (ref 70–140)
Potassium: 3.8 mEq/L (ref 3.5–5.1)
Sodium: 141 mEq/L (ref 136–145)
Total Protein: 7 g/dL (ref 6.4–8.3)

## 2015-03-20 LAB — CBC WITH DIFFERENTIAL/PLATELET
BASO%: 1.3 % (ref 0.0–2.0)
Basophils Absolute: 0.1 10*3/uL (ref 0.0–0.1)
EOS%: 1.3 % (ref 0.0–7.0)
Eosinophils Absolute: 0.1 10*3/uL (ref 0.0–0.5)
HEMATOCRIT: 39.9 % (ref 34.8–46.6)
HGB: 12.5 g/dL (ref 11.6–15.9)
LYMPH#: 2.8 10*3/uL (ref 0.9–3.3)
LYMPH%: 26.2 % (ref 14.0–49.7)
MCH: 24.5 pg — AB (ref 25.1–34.0)
MCHC: 31.3 g/dL — ABNORMAL LOW (ref 31.5–36.0)
MCV: 78.4 fL — AB (ref 79.5–101.0)
MONO#: 0.6 10*3/uL (ref 0.1–0.9)
MONO%: 6 % (ref 0.0–14.0)
NEUT#: 6.9 10*3/uL — ABNORMAL HIGH (ref 1.5–6.5)
NEUT%: 65.2 % (ref 38.4–76.8)
Platelets: 321 10*3/uL (ref 145–400)
RBC: 5.09 10*6/uL (ref 3.70–5.45)
RDW: 16.8 % — ABNORMAL HIGH (ref 11.2–14.5)
WBC: 10.6 10*3/uL — ABNORMAL HIGH (ref 3.9–10.3)

## 2015-03-20 NOTE — Progress Notes (Signed)
Hematology and Oncology Follow Up Visit  Sara Warner [id number] 1941-02-05 74 y.o. 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 Diagnosed a bone marrow biopsy on 10/24/2012.   Current treatment: Tasigna 300 mg bid started 11/2012 and she has complete hematological response.   Interim History: Sara Warner presents today for a follow up visit by her self. Since her last visit, she reports no new complaints. She continues to do well with Tasigna without complications. She did miss a few evening doses but for the most part very compliant with taking her medication. She is not reporting any further hair loss or any new complaints. She is asymptomatic and does not report any fevers, does not report any chills, does not report any lymphadenopathy. She has not had any constitutional symptoms. She does not report any back pain or bony pain. She has not really reported any major changes in her activity level. She is able to drive and again attend to most activities of daily living without any hindrance or decline. . She has not reported any recent hospitalizations or illnesses. She does not report any chest pain but does report occasional palpitations. She does not report any dyspnea on exertion or leg edema. She does not report any cough or hemoptysis. He does not report any nausea, vomiting, abdominal pain. She does not report any hematochezia or melena. The remaining review of systems unremarkable.   Medications: I have reviewed the patient's current medications. Current Outpatient Prescriptions  Medication Sig Dispense Refill  . aspirin 325 MG EC tablet Take 325 mg by mouth daily.    . clorazepate (TRANXENE-T) 7.5 MG tablet Take 1 tablet (7.5 mg total) by mouth 2 (two) times daily as needed for anxiety. 30 tablet 1  . digoxin (LANOXIN) 0.125 MG tablet Take 0.125 mg by mouth daily.    . furosemide (LASIX) 20 MG tablet Take 1  tablet (20 mg total) by mouth daily. 30 tablet 1  . glipiZIDE (GLUCOTROL XL) 10 MG 24 hr tablet Take 10 mg by mouth 2 (two) times daily.    . Levothyroxine Sodium (SYNTHROID PO) Take 125 mcg by mouth daily.     . potassium chloride SA (K-DUR,KLOR-CON) 20 MEQ tablet Take 1 tablet (20 mEq total) by mouth daily. 30 tablet 1  . TASIGNA 150 MG capsule TAKE 2 CAPSULES BY MOUTH EVERY 12 HOURS 112 capsule 0  . [DISCONTINUED] lisinopril (ZESTRIL) 2.5 MG tablet Take 1 tablet (2.5 mg total) by mouth daily. 90 tablet 4   No current facility-administered medications for this visit.    Allergies: No Known Allergies  Past Medical History, Surgical history, Social history, and Family History were reviewed and updated.   Physical Exam: Blood pressure 149/94, pulse 84, temperature 98.2 F (36.8 C), temperature source Oral, resp. rate 18, height _0  (1.727 m), weight 198 lb 3.2 oz (89.903 kg), SpO2 98 %. ECOG: 1 General appearance: alert awake appeared in no active distress. Head: Normocephalic, without obvious abnormality, atraumatic Neck: no adenopathy Lymph nodes: Cervical, supraclavicular, and axillary nodes normal. Heart:irregularly irregular rhythm no murmurs. Lung:chest clear, no wheezing, rales, normal symmetric air entry. Abdomen: soft, non-tender, without masses or organomegaly EXT:no erythema, induration, or nodules Skin: No rashes or lesions.  Lab Results: Lab Results  Component Value Date   WBC 10.6* 03/20/2015   HGB 12.5 03/20/2015   HCT 39.9 03/20/2015   MCV 78.4* 03/20/2015   PLT 321 03/20/2015  Chemistry      Component Value Date/Time   NA 144 09/18/2014 1137   NA 139 05/24/2012 1004   K 4.2 09/18/2014 1137   K 4.7 05/24/2012 1004   CL 106 04/26/2013 1252   CL 104 05/24/2012 1004   CO2 25 09/18/2014 1137   CO2 26 05/24/2012 1004   BUN 19.8 09/18/2014 1137   BUN 17 05/24/2012 1004   CREATININE 1.3* 09/18/2014 1137   CREATININE 0.82 05/24/2012 1004       Component Value Date/Time   CALCIUM 8.6 09/18/2014 1137   CALCIUM 9.4 05/24/2012 1004   ALKPHOS 43 09/18/2014 1137   ALKPHOS 38* 05/24/2012 1004   AST 17 09/18/2014 1137   AST 15 05/24/2012 1004   ALT 23 09/18/2014 1137   ALT 19 05/24/2012 1004   BILITOT 1.03 09/18/2014 1137   BILITOT 0.5 05/24/2012 1004         Impression and Plan:   This is a pleasant 74 year old with:  1. CML.  Chronic phase at this time. On Tasigna since 11/2012 with normalization of her her WBC. She is tolerating this medication well. She has achieved a complete hematological response. Last molecular studies done in October 2015 did not pick up on any  modalities. The plan is to continue the same dose and schedule and I emphasized adherence at this time.   2. Diabetes mellitus: She is currently on metformin without any complications.  3. Cardiomyopathy: There is no evidence of exacerbation related to Tasigna.   4. Follow-up: In 6 months.      Sara Warner 4/20/201610:27 AM

## 2015-03-20 NOTE — Telephone Encounter (Signed)
Gave avs & calendar for October. °

## 2015-03-25 ENCOUNTER — Other Ambulatory Visit: Payer: Self-pay | Admitting: Oncology

## 2015-03-27 ENCOUNTER — Ambulatory Visit (INDEPENDENT_AMBULATORY_CARE_PROVIDER_SITE_OTHER): Payer: Commercial Managed Care - HMO | Admitting: Podiatry

## 2015-03-27 ENCOUNTER — Encounter: Payer: Self-pay | Admitting: Podiatry

## 2015-03-27 VITALS — BP 127/57 | HR 60 | Temp 98.4°F | Resp 12

## 2015-03-27 DIAGNOSIS — G629 Polyneuropathy, unspecified: Secondary | ICD-10-CM | POA: Diagnosis not present

## 2015-03-27 DIAGNOSIS — L84 Corns and callosities: Secondary | ICD-10-CM

## 2015-03-27 DIAGNOSIS — E1142 Type 2 diabetes mellitus with diabetic polyneuropathy: Secondary | ICD-10-CM | POA: Diagnosis not present

## 2015-03-27 NOTE — Patient Instructions (Signed)
Our office will contact you when you're replacement diabetic shoes and custom shoe insoles arrive Return every 2 months for debridement of pre-ulcerative callus  Diabetes and Foot Care Diabetes may cause you to have problems because of poor blood supply (circulation) to your feet and legs. This may cause the skin on your feet to become thinner, break easier, and heal more slowly. Your skin may become dry, and the skin may peel and crack. You may also have nerve damage in your legs and feet causing decreased feeling in them. You may not notice minor injuries to your feet that could lead to infections or more serious problems. Taking care of your feet is one of the most important things you can do for yourself.  HOME CARE INSTRUCTIONS  Wear shoes at all times, even in the house. Do not go barefoot. Bare feet are easily injured.  Check your feet daily for blisters, cuts, and redness. If you cannot see the bottom of your feet, use a mirror or ask someone for help.  Wash your feet with warm water (do not use hot water) and mild soap. Then pat your feet and the areas between your toes until they are completely dry. Do not soak your feet as this can dry your skin.  Apply a moisturizing lotion or petroleum jelly (that does not contain alcohol and is unscented) to the skin on your feet and to dry, brittle toenails. Do not apply lotion between your toes.  Trim your toenails straight across. Do not dig under them or around the cuticle. File the edges of your nails with an emery board or nail file.  Do not cut corns or calluses or try to remove them with medicine.  Wear clean socks or stockings every day. Make sure they are not too tight. Do not wear knee-high stockings since they may decrease blood flow to your legs.  Wear shoes that fit properly and have enough cushioning. To break in new shoes, wear them for just a few hours a day. This prevents you from injuring your feet. Always look in your shoes  before you put them on to be sure there are no objects inside.  Do not cross your legs. This may decrease the blood flow to your feet.  If you find a minor scrape, cut, or break in the skin on your feet, keep it and the skin around it clean and dry. These areas may be cleansed with mild soap and water. Do not cleanse the area with peroxide, alcohol, or iodine.  When you remove an adhesive bandage, be sure not to damage the skin around it.  If you have a wound, look at it several times a day to make sure it is healing.  Do not use heating pads or hot water bottles. They may burn your skin. If you have lost feeling in your feet or legs, you may not know it is happening until it is too late.  Make sure your health care provider performs a complete foot exam at least annually or more often if you have foot problems. Report any cuts, sores, or bruises to your health care provider immediately. SEEK MEDICAL CARE IF:   You have an injury that is not healing.  You have cuts or breaks in the skin.  You have an ingrown nail.  You notice redness on your legs or feet.  You feel burning or tingling in your legs or feet.  You have pain or cramps in your legs and  feet.  Your legs or feet are numb.  Your feet always feel cold. SEEK IMMEDIATE MEDICAL CARE IF:   There is increasing redness, swelling, or pain in or around a wound.  There is a red line that goes up your leg.  Pus is coming from a wound.  You develop a fever or as directed by your health care provider.  You notice a bad smell coming from an ulcer or wound. Document Released: 11/13/2000 Document Revised: 07/19/2013 Document Reviewed: 04/25/2013 [facility] Patient Information 2015 [place], [place]. This information is not intended to replace advice given to you by your health care provider. Make sure you discuss any questions you have with your health care provider.

## 2015-03-27 NOTE — Progress Notes (Signed)
Patient ID: Sara Warner, female   DOB: 05-13-1941, 74 y.o.   MRN: [id number]  Subjective: This patient presents for follow-up care today for pre-ulcerative plantar callus sub-left first MPJ and measuring for diabetic shoes. 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

## 2015-04-22 ENCOUNTER — Other Ambulatory Visit: Payer: Self-pay | Admitting: Oncology

## 2015-05-20 ENCOUNTER — Other Ambulatory Visit: Payer: Self-pay | Admitting: Oncology

## 2015-05-27 ENCOUNTER — Encounter: Payer: Self-pay | Admitting: Cardiovascular Disease

## 2015-05-27 ENCOUNTER — Ambulatory Visit (INDEPENDENT_AMBULATORY_CARE_PROVIDER_SITE_OTHER): Payer: Commercial Managed Care - HMO | Admitting: Cardiovascular Disease

## 2015-05-27 VITALS — BP 148/80 | HR 121 | Ht 68.0 in | Wt 188.2 lb

## 2015-05-27 DIAGNOSIS — Z13220 Encounter for screening for lipoid disorders: Secondary | ICD-10-CM | POA: Diagnosis not present

## 2015-05-27 DIAGNOSIS — E039 Hypothyroidism, unspecified: Secondary | ICD-10-CM | POA: Diagnosis not present

## 2015-05-27 DIAGNOSIS — I429 Cardiomyopathy, unspecified: Secondary | ICD-10-CM

## 2015-05-27 DIAGNOSIS — Z79899 Other long term (current) drug therapy: Secondary | ICD-10-CM | POA: Diagnosis not present

## 2015-05-27 DIAGNOSIS — I4891 Unspecified atrial fibrillation: Secondary | ICD-10-CM | POA: Diagnosis not present

## 2015-05-27 DIAGNOSIS — C9290 Myeloid leukemia, unspecified, not having achieved remission: Secondary | ICD-10-CM

## 2015-05-27 DIAGNOSIS — C9210 Chronic myeloid leukemia, BCR/ABL-positive, not having achieved remission: Secondary | ICD-10-CM

## 2015-05-27 DIAGNOSIS — E119 Type 2 diabetes mellitus without complications: Secondary | ICD-10-CM

## 2015-05-27 MED ORDER — METOPROLOL SUCCINATE ER 50 MG PO TB24: 50.0000 mg | ORAL_TABLET | Freq: Every day | ORAL | Status: DC

## 2015-05-27 NOTE — Patient Instructions (Addendum)
Your physician has recommended you make the following change in your medication: start new prescription for metoprolol succ 50 mg. This has already been sent to your pharmacy. Take 1/2 tablet daily for 2 days then take 1 tablet daily.   Your physician recommends that you return for lab work fasting.  Your physician has requested that you have an echocardiogram. Echocardiography is a painless test that uses sound waves to create images of your heart. It provides your doctor with information about the size and shape of your heart and how well your heart's chambers and valves are working. This procedure takes approximately one hour. There are no restrictions for this procedure.  Your physician recommends that you schedule a follow-up appointment in: 6 weeks

## 2015-05-29 ENCOUNTER — Encounter: Payer: Self-pay | Admitting: Podiatry

## 2015-05-29 ENCOUNTER — Encounter: Payer: Self-pay | Admitting: Cardiovascular Disease

## 2015-05-29 ENCOUNTER — Ambulatory Visit (INDEPENDENT_AMBULATORY_CARE_PROVIDER_SITE_OTHER): Payer: Commercial Managed Care - HMO | Admitting: Podiatry

## 2015-05-29 VITALS — BP 127/71 | HR 97 | Resp 14

## 2015-05-29 DIAGNOSIS — L851 Acquired keratosis [keratoderma] palmaris et plantaris: Secondary | ICD-10-CM | POA: Diagnosis not present

## 2015-05-29 DIAGNOSIS — M2011 Hallux valgus (acquired), right foot: Secondary | ICD-10-CM | POA: Diagnosis not present

## 2015-05-29 DIAGNOSIS — M21611 Bunion of right foot: Secondary | ICD-10-CM

## 2015-05-29 DIAGNOSIS — E1142 Type 2 diabetes mellitus with diabetic polyneuropathy: Secondary | ICD-10-CM

## 2015-05-29 DIAGNOSIS — G629 Polyneuropathy, unspecified: Secondary | ICD-10-CM

## 2015-05-29 DIAGNOSIS — L84 Corns and callosities: Secondary | ICD-10-CM

## 2015-05-29 DIAGNOSIS — E119 Type 2 diabetes mellitus without complications: Secondary | ICD-10-CM | POA: Insufficient documentation

## 2015-05-29 NOTE — Progress Notes (Signed)
   Subjective:    Patient ID: Sara Warner, female    DOB: [birth year], 74 y.o.   MRN: [id number]  HPI This patient presents today for ongoing debridement of pre-ulcerative calluses right and left and for dispensing of diabetic shoes with custom insoles  Review of Systems  All other systems reviewed and are negative.      Objective:   Physical Exam  Plantar callus left first MPJ has large callus with bleeding within it Nucleated plantar keratoses sub-first MPJ right Left hallux nail was brittle and dystrophic HAV right Apex a 7000 women's 10.5 shoes 2  custom multilaminated insoles 6 with pocket accommodations first MPJ, bilaterally      Assessment & Plan:   Assessment Mycotic left hallux toenail: Pre-ulcerative plantar callus left Nucleated plantar callus sub-first MPJ right Diabetic with peripheral neuropathy HAV deformity right Satisfactory fit of custom insoles and diabetic shoes  Plan: Debridement of plantar calluses 2 Debrided left hallux nail No bleeding after debridement the skin or nails Dispensed, thick shoes as described above and 6 custom insoles with good fit Wear instruction provided for diabetic shoes  Reappoint 2 months

## 2015-05-29 NOTE — Patient Instructions (Signed)

## 2015-05-29 NOTE — Progress Notes (Signed)
Patient ID: Sara Warner, female   DOB: April 30, 1941, 74 y.o.   MRN: [id number]     HPI: Sara Warner is a 74 year old female who presents to the office today for a 21 month followup cardiology evaluation.   Sara Warner is a former patient of Dr. Doreatha Lew and in February 2013 saw Dr. Stanford Breed for one visit. She has a history of permanent atrial fibrillation and admits to being in atrial fibrillation for >15 years. She had problems with intermittent epistaxis in the past on Coumadin and consequently has not been on anticoagulation therapy. An echo Doppler study in 2008 showed an ejection fraction of 30-35%. A Myoview in February 2009 showed no ischemia but mild global reduction in LV function with an ejection fraction of 47%. An echo in 2010  showed low normal LV function with mild LA enlargement and mild mitral regurgitation and tricuspid regurgitation.  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 She does also have issues with thyroid abnormality. She has varicose veins and notes right lower extremity swelling.   When  I initially saw her in 2014, we discussed potential thromboembolic risk associated with atrial fibrillation. She has not been on any coagulation due to frequent nosebleeds requiring cauterization and does have CML. She did have a significant remote tobacco history of 2 packs per day for over 20 years. She did have nonspecific ST changes inferolaterally on her EKG. She subsequently was referred for a nuclear perfusion scan which was done on 07/05/2013. This was not gated to her atrial fibrillation and was a low risk study demonstrating mild artifact. There was no scar or ischemia. A 2-D echo Doppler study showed normal systolic function and Doppler parameters suggested increased mean left atrial pressure. She is systolic bowing of her mitral valve leaflet without definitive prolapse with mild MR. Her left atrium was moderate to severely dilated and her right atrium was moderately dilated.  There was mild pulmonary hypertension with PA pressure estimated at 34 mm.  Since I last saw her, she states she was given a prescription for verapamil for rate control by Cyndi Bender, PA, but stopped this secondary to develop leg edema.  Remotely, she had been on carvedilol.  Presently, she is on digoxin 0.125 mg for only rate control medication.  She is taking Lasix 20 mg for leg edema.  She has type 2 diabetes mellitus and is on Glucotrol 10 mg.  She is on Synthroid 150 g for hypothyroidism.  She is taking to [facility]igna for CML.  She has been taking full dose aspirin.  She presents for evaluation.  Past Medical History  Diagnosis Date  . Arrhythmia     chronic atrial fib  . Toxic goiter   . Ejection fraction < 50%   . Hypothyroidism   . Atrial fibrillation   . CHF (congestive heart failure)   . Elevated WBC count   . 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 Atrial fibrillation     Past Surgical History  Procedure Laterality Date  . Cystectomy    . Hysterotomy    . Cardioversion      No Known Allergies  Current Outpatient Prescriptions  Medication Sig Dispense Refill  . aspirin 325 MG EC tablet Take 325 mg by mouth daily.    . clorazepate (TRANXENE-T) 7.5 MG tablet Take 1 tablet (7.5 mg total) by mouth 2 (two) times daily as needed for anxiety. 30 tablet 1  . digoxin (LANOXIN) 0.125 MG tablet Take 0.125 mg by mouth daily.    Marland Kitchen  furosemide (LASIX) 20 MG tablet Take 1 tablet (20 mg total) by mouth daily. 30 tablet 1  . glipiZIDE (GLUCOTROL XL) 10 MG 24 hr tablet Take 10 mg by mouth 2 (two) times daily.    . Levothyroxine Sodium (SYNTHROID PO) Take 125 mcg by mouth daily.     Marland Kitchen omeprazole (PRILOSEC) 40 MG capsule Take 40 mg by mouth daily.  2  . potassium chloride SA (K-DUR,KLOR-CON) 20 MEQ tablet Take 1 tablet (20 mEq total) by mouth daily. 30 tablet 1  . SYNTHROID 150 MCG tablet Take 150 mcg by mouth daily.  1  . TASIGNA 150 MG capsule TAKE 2 CAPSULES BY MOUTH EVERY 12  HOURS 112 capsule 0  . metoprolol succinate (TOPROL-XL) 50 MG 24 hr tablet Take 1 tablet (50 mg total) by mouth daily. Take with or immediately following a meal. 90 tablet 3  . [DISCONTINUED] lisinopril (ZESTRIL) 2.5 MG tablet Take 1 tablet (2.5 mg total) by mouth daily. 90 tablet 4   No current facility-administered medications for this visit.    Socially she is widowed for 23 years. She has 5 children 15 grandchildren and 17 great grandchildren. She lives at home with her 2 grandchildren. She is retired. She completed ninth grade of education. She is a former smoker but quit smoking in 1987 there prior to that she had smoked up to 2 packs per day for 20 years. There is no alcohol use.  Family History  Problem Relation Age of Onset  . Cancer - Colon Mother     mast to lungs  . Diabetes Father   . Diabetes Paternal Grandmother     ROS General: Negative; No fevers, chills, or night sweats;  HEENT: Negative; No changes in vision or hearing, sinus congestion, difficulty swallowing Pulmonary: Negative; No cough, wheezing, shortness of breath, hemoptysis Cardiovascular: Negative; No chest pain, presyncope, syncope, palpitations Occasional leg swelling GI: Negative; No nausea, vomiting, diarrhea, or abdominal pain GU: Negative; No dysuria, hematuria, or difficulty voiding Musculoskeletal: Negative; no myalgias, joint pain, or weakness Hematologic/Oncology: Positive for CML Endocrine: Positive for hypothyroidism and diabetes Neuro: Negative; no changes in balance, headaches Skin: Negative; No rashes or skin lesions Psychiatric: Negative; No behavioral problems, depression Sleep: Negative; No snoring, daytime sleepiness, hypersomnolence, bruxism, restless legs, hypnogognic hallucinations, no cataplexy Other comprehensive 14 point system review is negative.  PE BP 148/80 mmHg  Pulse 121  Ht 5' 8"  (1.727 m)  Wt 188 lb 3.2 oz (85.367 kg)  BMI 28.62 kg/m2   Wt Readings from Last 3  Encounters:  05/27/15 188 lb 3.2 oz (85.367 kg)  03/20/15 198 lb 3.2 oz (89.903 kg)  09/18/14 198 lb 12.8 oz (90.175 kg)   General: Alert, oriented, no distress.  Skin: normal turgor, no rashes HEENT: Normocephalic, atraumatic. Pupils round and reactive; sclera anicteric; Fundi without hemorrhages or exudates. Nose without nasal septal hypertrophy Mouth/Parynx benign; Mallinpatti scale 3 Neck: No JVD, no carotid bruits with normal carotid upstroke Chest wall: Nontender to palpation Lungs: clear to ausculatation and percussion; no wheezing or rales Heart: Tachycardic with atrial fibrillation with rapid ventricular response, heart rate 480.  1/6 systolic murmur.  No diastolic murmur Abdomen: soft, nontender; no hepatosplenomehaly, BS+; abdominal aorta nontender and not dilated by palpation. Back: No CVA tenderness Pulses 2+ Extremities: Mild varicosities, trace right lower extremity edema, no clubbinbg cyanosis, Homan's sign negative  Neurologic: grossly nonfocal Psychological: Normal affect and mood  ECG (independently read by me): Atrial fibrillation with rapid ventricular response with a  rate at 1 21 bpm.  Nonspecific inferolateral ST segment changes.     BMP Latest Ref Rng 03/20/2015 09/18/2014 03/07/2014  Glucose 70 - 140 mg/dl 183(H) 215(H) 129  BUN 7.0 - 26.0 mg/dL 14.3 19.8 18.2  Creatinine 0.6 - 1.1 mg/dL 0.8 1.3(H) 1.1  Sodium 136 - 145 mEq/L 141 144 142  Potassium 3.5 - 5.1 mEq/L 3.8 4.2 4.0  Chloride 98 - 107 mEq/L - - -  CO2 22 - 29 mEq/L 22 25 27   Calcium 8.4 - 10.4 mg/dL 8.9 8.6 9.3   Hepatic Function Latest Ref Rng 03/20/2015 09/18/2014 03/07/2014  Total Protein 6.4 - 8.3 g/dL 7.0 6.0(L) 6.9  Albumin 3.5 - 5.0 g/dL 4.2 3.6 4.1  AST 5 - 34 U/L 22 17 14   ALT 0 - 55 U/L 28 23 16   Alk Phosphatase 40 - 150 U/L 63 43 49  Total Bilirubin 0.20 - 1.20 mg/dL 0.96 1.03 0.88   CBC Latest Ref Rng 03/20/2015 09/18/2014 03/07/2014  WBC 3.9 - 10.3 10e3/uL 10.6(H) 8.5 10.3    Hemoglobin 11.6 - 15.9 g/dL 12.5 12.4 13.4  Hematocrit 34.8 - 46.6 % 39.9 39.7 41.5  Platelets 145 - 400 10e3/uL 321 288 317   Lab Results  Component Value Date   MCV 78.4* 03/20/2015   MCV 85.5 09/18/2014   MCV 84.7 03/07/2014   Lab Results  Component Value Date   TSH 3.063 12/07/2012  No results found for: HGBA1C   Lipid Panel  No results found for: CHOL, TRIG, HDL, CHOLHDL, VLDL, LDLCALC, LDLDIRECT    RADIOLOGY: No results found.   ASSESSMENT AND PLAN: Ms. Falwell is a 74 year old female with a history of permanent atrial fibrillation. She  is not on anticoagulation due to history of frequent nosebleeds in the past requiring cauterization. We again discussed the potential for CVA risk with atrial fibrillation compounding over the years. She is on aspirin.  She also has been diagnosed with CML and is undergoing treatment. Her nuclear study  study arteries against an ischemic etiology to her ECG changes.  When I last saw her, I recommended further titration of her carvedilol for rate control.  Apparently over the past 21 months, she no longer is taking carvedilol.  She also was given a trial of verapamil, but apparently developed significant leg edema.  Her heart rate today is 121 bpm.  I am starting her on Toprol-XL 25 mg to take for the next 2 days and then she will titrate this to 50 mg.  Blood work will be obtained in the fasting state including a Cmet, CBC, dig.  Level, magnesium level, TSH, hemoglobin A1c, and lipid studies..  I am also scheduling her for an echo Doppler study to reassess systolic and diastolic function as well as valvular architecture.  In addition to chamber enlargement.  She is not having any chest pain.  At present, she will continue her current dose of digoxin.  I will see her in 6 weeks for reevaluation and further conditions.  We made at that time.  Time spent: 25 minutes  Troy Sine, MD, [facility] 05/29/2015 8:49 PM

## 2015-05-30 ENCOUNTER — Ambulatory Visit (HOSPITAL_COMMUNITY)
Admission: RE | Admit: 2015-05-30 | Discharge: 2015-05-30 | Disposition: A | Discharge status: Home / Self Care | Payer: Commercial Managed Care - HMO | Source: Ambulatory Visit | Attending: Cardiology | Admitting: Cardiology

## 2015-05-30 DIAGNOSIS — I071 Rheumatic tricuspid insufficiency: Secondary | ICD-10-CM | POA: Diagnosis not present

## 2015-05-30 DIAGNOSIS — I517 Cardiomegaly: Secondary | ICD-10-CM | POA: Diagnosis not present

## 2015-05-30 DIAGNOSIS — I4891 Unspecified atrial fibrillation: Secondary | ICD-10-CM

## 2015-05-30 DIAGNOSIS — I340 Nonrheumatic mitral (valve) insufficiency: Secondary | ICD-10-CM | POA: Diagnosis not present

## 2015-05-31 LAB — CBC
HEMATOCRIT: 40.2 % (ref 36.0–46.0)
Hemoglobin: 12.5 g/dL (ref 12.0–15.0)
MCH: 24.2 pg — ABNORMAL LOW (ref 26.0–34.0)
MCHC: 31.1 g/dL (ref 30.0–36.0)
MCV: 77.9 fL — ABNORMAL LOW (ref 78.0–100.0)
MPV: 9.4 fL (ref 8.6–12.4)
Platelets: 422 10*3/uL — ABNORMAL HIGH (ref 150–400)
RBC: 5.16 MIL/uL — ABNORMAL HIGH (ref 3.87–5.11)
RDW: 16.2 % — ABNORMAL HIGH (ref 11.5–15.5)
WBC: 12.9 10*3/uL — AB (ref 4.0–10.5)

## 2015-05-31 LAB — TSH: TSH: 1.932 u[IU]/mL (ref 0.350–4.500)

## 2015-05-31 LAB — COMPREHENSIVE METABOLIC PANEL
ALK PHOS: 53 U/L (ref 39–117)
ALT: 20 U/L (ref 0–35)
AST: 19 U/L (ref 0–37)
Albumin: 4.1 g/dL (ref 3.5–5.2)
BUN: 17 mg/dL (ref 6–23)
CO2: 26 mEq/L (ref 19–32)
CREATININE: 0.8 mg/dL (ref 0.50–1.10)
Calcium: 9.1 mg/dL (ref 8.4–10.5)
Chloride: 104 mEq/L (ref 96–112)
Glucose, Bld: 125 mg/dL — ABNORMAL HIGH (ref 70–99)
POTASSIUM: 4.5 meq/L (ref 3.5–5.3)
SODIUM: 140 meq/L (ref 135–145)
Total Bilirubin: 0.7 mg/dL (ref 0.2–1.2)
Total Protein: 6.6 g/dL (ref 6.0–8.3)

## 2015-05-31 LAB — MAGNESIUM: MAGNESIUM: 2.1 mg/dL (ref 1.5–2.5)

## 2015-05-31 LAB — DIGOXIN LEVEL: Digoxin Level: 0.7 ug/L — ABNORMAL LOW (ref 0.8–2.0)

## 2015-05-31 LAB — LIPID PANEL
CHOL/HDL RATIO: 4.7 ratio
Cholesterol: 128 mg/dL (ref 0–200)
HDL: 27 mg/dL — AB (ref >=46)
LDL CALC: 79 mg/dL (ref 0–99)
Triglycerides: 110 mg/dL (ref <150)
VLDL: 22 mg/dL (ref 0–40)

## 2015-06-19 ENCOUNTER — Other Ambulatory Visit: Payer: Self-pay | Admitting: *Deleted

## 2015-06-19 ENCOUNTER — Telehealth: Payer: Self-pay | Admitting: *Deleted

## 2015-06-19 DIAGNOSIS — C9210 Chronic myeloid leukemia, BCR/ABL-positive, not having achieved remission: Secondary | ICD-10-CM

## 2015-06-19 MED ORDER — NILOTINIB HCL 150 MG PO CAPS: ORAL_CAPSULE | ORAL | Status: DC

## 2015-06-19 NOTE — Telephone Encounter (Signed)
VM message from pt regarding refill for her Tasigna. She stated that it is not at pharmacy. Refill escribed to [facility]

## 2015-06-19 NOTE — Telephone Encounter (Signed)
Fax Receipt Acknowledgement for tasigna from [facility].  Per Sara Maize RN order sent to [facility].  [facility] does not have order "Patient is calling us for medication."  Order sent and [facility] notified.  If order needed from [facility] a new order will need to be sent.

## 2015-07-10 ENCOUNTER — Ambulatory Visit (INDEPENDENT_AMBULATORY_CARE_PROVIDER_SITE_OTHER): Payer: Commercial Managed Care - HMO | Admitting: Cardiovascular Disease

## 2015-07-10 ENCOUNTER — Encounter: Payer: Self-pay | Admitting: Cardiovascular Disease

## 2015-07-10 VITALS — BP 142/84 | HR 81 | Ht 68.0 in | Wt 195.1 lb

## 2015-07-10 DIAGNOSIS — I429 Cardiomyopathy, unspecified: Secondary | ICD-10-CM

## 2015-07-10 DIAGNOSIS — C9290 Myeloid leukemia, unspecified, not having achieved remission: Secondary | ICD-10-CM | POA: Diagnosis not present

## 2015-07-10 DIAGNOSIS — C9210 Chronic myeloid leukemia, BCR/ABL-positive, not having achieved remission: Secondary | ICD-10-CM

## 2015-07-10 DIAGNOSIS — I4891 Unspecified atrial fibrillation: Secondary | ICD-10-CM

## 2015-07-10 DIAGNOSIS — E119 Type 2 diabetes mellitus without complications: Secondary | ICD-10-CM

## 2015-07-10 DIAGNOSIS — E039 Hypothyroidism, unspecified: Secondary | ICD-10-CM

## 2015-07-10 MED ORDER — ASPIRIN EC 81 MG PO TBEC: 81.0000 mg | DELAYED_RELEASE_TABLET | Freq: Every day | ORAL | Status: DC

## 2015-07-10 MED ORDER — METOPROLOL SUCCINATE ER 50 MG PO TB24: ORAL_TABLET | ORAL | Status: DC

## 2015-07-10 NOTE — Patient Instructions (Signed)
Your physician has recommended you make the following change in your medication: the metoprolol has been increase to 1 & 1/2 tablet daily (75 mg)  Start aspirin 81 mg daily.  Your physician recommends that you schedule a follow-up appointment in: 4 months.

## 2015-07-12 ENCOUNTER — Encounter: Payer: Self-pay | Admitting: Cardiovascular Disease

## 2015-07-12 NOTE — Progress Notes (Signed)
Patient ID: Sara Warner, female   DOB: February 03, 1941, 74 y.o.   MRN: [id number]     HPI: [name] is a 74 year old female who presents to the office today for a 2 month followup cardiology evaluation.   [name] is a former patient of Dr. Doreatha Lew and in February 2013 saw Dr. Stanford Breed for one visit. She has a history of permanent atrial fibrillation and admits to being in atrial fibrillation for >15 years. She had problems with recurrent epistaxis in the past on Coumadin and consequently has not been on anticoagulation therapy. An echo Doppler study in 2008 showed an ejection fraction of 30-35%. A Myoview in February 2009 showed no ischemia but mild global reduction in LV function with an ejection fraction of 47%. An echo in 2010  showed low normal LV function with mild LA enlargement and mild mitral regurgitation and tricuspid regurgitation.  [name] has also developed CML. She does also have issues with thyroid abnormality. She has varicose veins and notes right lower extremity swelling.   When  I initially saw her in 2014, we discussed potential thromboembolic risk associated with atrial fibrillation. She has not been on any coagulation due to frequent nosebleeds requiring cauterization and does have CML. She did have a significant remote tobacco history of 2 packs per day for over 20 years. She had nonspecific ST changes inferolaterally on her EKG. A nuclear perfusion scan  on 07/05/2013 was not gated to her atrial fibrillation and was a low risk study demonstrating mild artifact. There was no scar or ischemia. A 2-D echo Doppler study showed normal systolic function and Doppler parameters suggested increased mean left atrial pressure. She is systolic bowing of her mitral valve leaflet without definitive prolapse with mild MR. Her left atrium was moderate to severely dilated and her right atrium was moderately dilated. There was mild pulmonary hypertension with PA pressure  estimated at 34 mm.  She was given a prescription for verapamil for rate control by Cyndi Bender, PA, but stopped this secondary to develop leg edema.  Remotely, she had been on carvedilol. When  I saw her 2 months ago, she was on digoxin 0.125 mg for only rate control medication, Lasix 20 mg for leg edema.  She has type 2 diabetes mellitus and is on Glucotrol 10 mg.  She is on Synthroid 150 g for hypothyroidism.  She is taking to [facility]igna for CML.  At that time, I initiated therapy with Toprol-XL 25 mg for 2 days and recommended titration up to 50 mg.  She underwent laboratory which revealed mildly microcytic indices with MCV of 77 and hemoglobin of 12.5 and hematocrit 40.2.  Fasting glucose was 125.  She had normal renal function and LFTs.  Her digoxin level was 0.7.  Magnesium 2.1.  Lipid studies reveals an excellent total cholesterol 128, triglycerides 110, and LDL cholesterol 79.  Her HDL was low at 27.  Showed normal TSH at 1.92.  She underwent a follow-up echo Doppler study on 05/30/2015 which revealed an ejection fraction of 35-40%.  There was diffuse hypokinesis with suggestion of akinesis involving the basal to mid inferoseptal wall.  There was aortic valve sclerosis without stenosis, and mild-to-moderate mitral and tricuspid regurgitation.  Her left atrium was moderately dilated.  There was mild pulmonary hypertension with estimated PA pressure 37 mm.  She denies chest pain.  She denies PND, orthopnea.  She denies bleeding.  She presents for reevaluation.  Past Medical History  Diagnosis Date  . Arrhythmia  chronic atrial fib  . Toxic goiter   . Ejection fraction < 50%   . Hypothyroidism   . Atrial fibrillation   . CHF (congestive heart failure)   . Elevated WBC count   . 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 Atrial fibrillation     Past Surgical History  Procedure Laterality Date  . Cystectomy    . Hysterotomy    . Cardioversion      No Known Allergies  Current  Outpatient Prescriptions  Medication Sig Dispense Refill  . clorazepate (TRANXENE-T) 7.5 MG tablet Take 1 tablet (7.5 mg total) by mouth 2 (two) times daily as needed for anxiety. 30 tablet 1  . digoxin (LANOXIN) 0.125 MG tablet Take 0.125 mg by mouth daily.    . furosemide (LASIX) 20 MG tablet Take 1 tablet (20 mg total) by mouth daily. 30 tablet 1  . glipiZIDE (GLUCOTROL XL) 10 MG 24 hr tablet Take 10 mg by mouth 2 (two) times daily.    . Levothyroxine Sodium (SYNTHROID PO) Take 125 mcg by mouth daily.     . metoprolol succinate (TOPROL-XL) 50 MG 24 hr tablet Take 1.5 tablets daily 45 tablet 6  . Mouthwashes (MOUTHWASH/GARGLE) LIQD Swish for 30 second and spit out tid  2  . nilotinib (TASIGNA) 150 MG capsule TAKE 2 CAPSULES BY MOUTH EVERY 12 HOURS 112 capsule 0  . omeprazole (PRILOSEC) 40 MG capsule Take 40 mg by mouth daily.  2  . potassium chloride SA (K-DUR,KLOR-CON) 20 MEQ tablet Take 1 tablet (20 mEq total) by mouth daily. 30 tablet 1  . SYNTHROID 150 MCG tablet Take 150 mcg by mouth daily.  1  . aspirin EC 81 MG tablet Take 1 tablet (81 mg total) by mouth daily. 90 tablet 3  . [DISCONTINUED] lisinopril (ZESTRIL) 2.5 MG tablet Take 1 tablet (2.5 mg total) by mouth daily. 90 tablet 4   No current facility-administered medications for this visit.    Socially she is widowed for 23 years. She has 5 children 15 grandchildren and 17 great grandchildren. She lives at home with her 2 grandchildren. She is retired. She completed ninth grade of education. She is a former smoker but quit smoking in 1987 there prior to that she had smoked up to 2 packs per day for 20 years. There is no alcohol use.  Family History  Problem Relation Age of Onset  . Cancer - Colon Mother     mast to lungs  . Diabetes Father   . Diabetes Paternal Grandmother     ROS General: Negative; No fevers, chills, or night sweats;  HEENT: Negative; No changes in vision or hearing, sinus congestion, difficulty  swallowing Pulmonary: Negative; No cough, wheezing, shortness of breath, hemoptysis Cardiovascular: Negative; No chest pain, presyncope, syncope, palpitations Occasional leg swelling GI: Negative; No nausea, vomiting, diarrhea, or abdominal pain GU: Negative; No dysuria, hematuria, or difficulty voiding Musculoskeletal: Negative; no myalgias, joint pain, or weakness Hematologic/Oncology: Positive for CML Endocrine: Positive for hypothyroidism and diabetes Neuro: Negative; no changes in balance, headaches Skin: Negative; No rashes or skin lesions Psychiatric: Negative; No behavioral problems, depression Sleep: Negative; No snoring, daytime sleepiness, hypersomnolence, bruxism, restless legs, hypnogognic hallucinations, no cataplexy Other comprehensive 14 point system review is negative.  PE BP 142/84 mmHg  Pulse 81  Ht _0  (1.727 m)  Wt 195 lb 1 oz (88.48 kg)  BMI 29.67 kg/m2   Wt Readings from Last 3 Encounters:  07/10/15 195 lb 1 oz (88.48 kg)  05/27/15 188 lb 3.2 oz (85.367 kg)  03/20/15 198 lb 3.2 oz (89.903 kg)   General: Alert, oriented, no distress.  Skin: normal turgor, no rashes HEENT: Normocephalic, atraumatic. Pupils round and reactive; sclera anicteric; Fundi without hemorrhages or exudates. Nose without nasal septal hypertrophy Mouth/Parynx benign; Mallinpatti scale 3 Neck: No JVD, no carotid bruits with normal carotid upstroke Chest wall: Nontender to palpation Lungs: clear to ausculatation and percussion; no wheezing or rales Heart:  atrial fibrillation with a ventricular rate in the 87F;   1/6 systolic murmur.  No diastolic murmur Abdomen: soft, nontender; no hepatosplenomehaly, BS+; abdominal aorta nontender and not dilated by palpation. Back: No CVA tenderness Pulses 2+ Extremities: Mild varicosities, trace right lower extremity edema, no clubbinbg cyanosis, Homan's sign negative  Neurologic: grossly nonfocal Psychological: Normal affect and mood  ECG  (independently read by me): Atrial fibrillation with rate in the 80s.  Inferolateral ST-T changes.  QTc interval 420 ms.   June 2016 ECG (independently read by me): Atrial fibrillation with rapid ventricular response with a rate at 1 21 bpm.  Nonspecific inferolateral ST segment changes.     BMP Latest Ref Rng 05/30/2015 03/20/2015 09/18/2014  Glucose 70 - 99 mg/dL 125(H) 183(H) 215(H)  BUN 6 - 23 mg/dL 17 14.3 19.8  Creatinine 0.50 - 1.10 mg/dL 0.80 0.8 1.3(H)  Sodium 135 - 145 mEq/L 140 141 144  Potassium 3.5 - 5.3 mEq/L 4.5 3.8 4.2  Chloride 96 - 112 mEq/L 104 - -  CO2 19 - 32 mEq/L _0 Calcium 8.4 - 10.5 mg/dL 9.1 8.9 8.6   Hepatic Function Latest Ref Rng 05/30/2015 03/20/2015 09/18/2014  Total Protein 6.0 - 8.3 g/dL 6.6 7.0 6.0(L)  Albumin 3.5 - 5.2 g/dL 4.1 4.2 3.6  AST 0 - 37 U/L _1 ALT 0 - 35 U/L _2 Alk Phosphatase 39 - 117 U/L 53 63 43  Total Bilirubin 0.2 - 1.2 mg/dL 0.7 0.96 1.03   CBC Latest Ref Rng 05/30/2015 03/20/2015 09/18/2014  WBC 4.0 - 10.5 K/uL 12.9(H) 10.6(H) 8.5  Hemoglobin 12.0 - 15.0 g/dL 12.5 12.5 12.4  Hematocrit 36.0 - 46.0 % 40.2 39.9 39.7  Platelets 150 - 400 K/uL 422(H) 321 288   Lab Results  Component Value Date   MCV 77.9* 05/30/2015   MCV 78.4* 03/20/2015   MCV 85.5 09/18/2014   Lab Results  Component Value Date   TSH 1.932 05/30/2015  No results found for: HGBA1C   Lipid Panel     Component Value Date/Time   CHOL 128 05/30/2015 0932   TRIG 110 05/30/2015 0932   HDL 27* 05/30/2015 0932   CHOLHDL 4.7 05/30/2015 0932   VLDL 22 05/30/2015 0932   LDLCALC 79 05/30/2015 0932      RADIOLOGY: No results found.   ASSESSMENT AND PLAN: Ms. Espe is a 74 year old female with a history of permanent atrial fibrillation for over 15 years duration.. She has not been on anticoagulation due to history of frequent nosebleeds in the past requiring cauterization.  When I last saw her, we again discussed the potential for CVA  risk with atrial fibrillation compounding over the years. She is on aspirin.  She also has been diagnosed with CML and is undergoing treatment. Her last nuclear study  study arteries against an ischemic etiology to her ECG changes.  Her ventricular rate is improved today in the 80s.  In comparison to over 120 when I saw her 2 months ago.  She is now on Toprol XL 50 mg daily.  I will further titrate this to 75 mg, which also will be helpful for her mild hypertension.  Her blood pressure today was 142/84.  She is on Synthroid 150 g for thyroid replacement and her most recent TSH is normal.  There is no edema on her current dose of Lasix.  I reviewed her echo Doppler data, which essentially is unchanged and continues to show an ejection fraction in the 35-40% range.  She does have mild to moderate mitral and tricuspid regurgitation and mild pulmonary hypertension.  She will monitor her blood pressure.  It may be possible to initiate ACE inhibition or ARB therapy light of her LV dysfunction.  I can have recommended initiation of a least aspirin therapy 81 mg, which she had not been taken.  She is aware of increased stroke risk without anticoagulation.  She has GERD and this is controlled with her current dose of omeprazole.  She has type 2 diabetes mellitus on glipizide 10 mg daily.  I will see her in several months for follow up evaluation.  Further recommendations will be made at that time.  Time spent: 25 minutes  Troy Sine, MD, [facility] 07/12/2015 5:57 PM

## 2015-07-17 ENCOUNTER — Other Ambulatory Visit: Payer: Self-pay | Admitting: *Deleted

## 2015-07-17 ENCOUNTER — Telehealth: Payer: Self-pay | Admitting: *Deleted

## 2015-07-17 DIAGNOSIS — C9210 Chronic myeloid leukemia, BCR/ABL-positive, not having achieved remission: Secondary | ICD-10-CM

## 2015-07-17 MED ORDER — NILOTINIB HCL 150 MG PO CAPS: ORAL_CAPSULE | ORAL | Status: DC

## 2015-07-17 NOTE — Telephone Encounter (Signed)
Refilled tasigna. Sent to [facility]. Spoke with Velva Harman at the home.

## 2015-07-17 NOTE — Telephone Encounter (Signed)
"  I am going to be out of Tasigna soon.  I called my pharmacy yesterday and they haven't heard from you all yet.  Please call [phone]"

## 2015-07-30 ENCOUNTER — Encounter: Payer: Self-pay | Admitting: Podiatry

## 2015-07-30 ENCOUNTER — Ambulatory Visit (INDEPENDENT_AMBULATORY_CARE_PROVIDER_SITE_OTHER): Payer: Commercial Managed Care - HMO | Admitting: Podiatry

## 2015-07-30 VITALS — BP 142/86 | HR 69 | Temp 97.6°F | Resp 12

## 2015-07-30 DIAGNOSIS — E1142 Type 2 diabetes mellitus with diabetic polyneuropathy: Secondary | ICD-10-CM

## 2015-07-30 DIAGNOSIS — L84 Corns and callosities: Secondary | ICD-10-CM | POA: Diagnosis not present

## 2015-07-30 DIAGNOSIS — G629 Polyneuropathy, unspecified: Secondary | ICD-10-CM

## 2015-07-30 NOTE — Patient Instructions (Signed)
Diabetes and Foot Care Diabetes may cause you to have problems because of poor blood supply (circulation) to your feet and legs. This may cause the skin on your feet to become thinner, break easier, and heal more slowly. Your skin may become dry, and the skin may peel and crack. You may also have nerve damage in your legs and feet causing decreased feeling in them. You may not notice minor injuries to your feet that could lead to infections or more serious problems. Taking care of your feet is one of the most important things you can do for yourself.  HOME CARE INSTRUCTIONS  Wear shoes at all times, even in the house. Do not go barefoot. Bare feet are easily injured.  Check your feet daily for blisters, cuts, and redness. If you cannot see the bottom of your feet, use a mirror or ask someone for help.  Wash your feet with warm water (do not use hot water) and mild soap. Then pat your feet and the areas between your toes until they are completely dry. Do not soak your feet as this can dry your skin.  Apply a moisturizing lotion or petroleum jelly (that does not contain alcohol and is unscented) to the skin on your feet and to dry, brittle toenails. Do not apply lotion between your toes.  Trim your toenails straight across. Do not dig under them or around the cuticle. File the edges of your nails with an emery board or nail file.  Do not cut corns or calluses or try to remove them with medicine.  Wear clean socks or stockings every day. Make sure they are not too tight. Do not wear knee-high stockings since they may decrease blood flow to your legs.  Wear shoes that fit properly and have enough cushioning. To break in new shoes, wear them for just a few hours a day. This prevents you from injuring your feet. Always look in your shoes before you put them on to be sure there are no objects inside.  Do not cross your legs. This may decrease the blood flow to your feet.  If you find a minor scrape,  cut, or break in the skin on your feet, keep it and the skin around it clean and dry. These areas may be cleansed with mild soap and water. Do not cleanse the area with peroxide, alcohol, or iodine.  When you remove an adhesive bandage, be sure not to damage the skin around it.  If you have a wound, look at it several times a day to make sure it is healing.  Do not use heating pads or hot water bottles. They may burn your skin. If you have lost feeling in your feet or legs, you may not know it is happening until it is too late.  Make sure your health care provider performs a complete foot exam at least annually or more often if you have foot problems. Report any cuts, sores, or bruises to your health care provider immediately. SEEK MEDICAL CARE IF:   You have an injury that is not healing.  You have cuts or breaks in the skin.  You have an ingrown nail.  You notice redness on your legs or feet.  You feel burning or tingling in your legs or feet.  You have pain or cramps in your legs and feet.  Your legs or feet are numb.  Your feet always feel cold. SEEK IMMEDIATE MEDICAL CARE IF:   There is increasing redness,   swelling, or pain in or around a wound.  There is a red line that goes up your leg.  Pus is coming from a wound.  You develop a fever or as directed by your health care provider.  You notice a bad smell coming from an ulcer or wound. Document Released: 11/13/2000 Document Revised: 07/19/2013 Document Reviewed: 04/25/2013 [employer] Patient Information 2015 [employer]. This information is not intended to replace advice given to you by your health care provider. Make sure you discuss any questions you have with your health care provider.  

## 2015-07-30 NOTE — Progress Notes (Signed)
Patient ID: Sara Warner, female   DOB: 1941-10-25, 74 y.o.   MRN: [id number]  Subjective: This patient presents today for ongoing debridement of pre-ulcerative plantar callus on the left for first MPJ at 8 week intervals patient currently wearing diabetic shoes with custom insoles Patient also complaining of mild discomfort medial margin third left toenail  Objective: Large bleeding callus plantar left first MPJ that remains closed after debridement. There is no surrounding erythema, edema or drainage around this site Plantar keratoses right first MPJ Medial margin third left toenails incurvated without any surrounding erythema ,edema, or drainage  Assessment: Pre-ulcerative plantar callus left Diabetic peripheral neuropathy Plantar callus right Incurvated medial margin third left toenail  Plan: Debridement of pre-ulcerative callus and callus 2 without any bleeding Debride medial margin third left toenail without any bleeding   Reappoint 2 months

## 2015-08-13 ENCOUNTER — Other Ambulatory Visit: Payer: Self-pay | Admitting: *Deleted

## 2015-08-13 DIAGNOSIS — C9210 Chronic myeloid leukemia, BCR/ABL-positive, not having achieved remission: Secondary | ICD-10-CM

## 2015-08-13 MED ORDER — NILOTINIB HCL 150 MG PO CAPS: ORAL_CAPSULE | ORAL | Status: DC

## 2015-09-10 ENCOUNTER — Other Ambulatory Visit: Payer: Self-pay | Admitting: *Deleted

## 2015-09-10 DIAGNOSIS — C9210 Chronic myeloid leukemia, BCR/ABL-positive, not having achieved remission: Secondary | ICD-10-CM

## 2015-09-10 MED ORDER — NILOTINIB HCL 150 MG PO CAPS: ORAL_CAPSULE | ORAL | Status: DC

## 2015-09-18 ENCOUNTER — Telehealth: Payer: Self-pay | Admitting: Oncology

## 2015-09-18 ENCOUNTER — Other Ambulatory Visit (HOSPITAL_BASED_OUTPATIENT_CLINIC_OR_DEPARTMENT_OTHER): Payer: Commercial Managed Care - HMO

## 2015-09-18 ENCOUNTER — Ambulatory Visit (HOSPITAL_BASED_OUTPATIENT_CLINIC_OR_DEPARTMENT_OTHER): Payer: Commercial Managed Care - HMO | Admitting: Oncology

## 2015-09-18 VITALS — BP 140/77 | HR 86 | Temp 98.0°F | Resp 17 | Ht 68.0 in | Wt 190.0 lb

## 2015-09-18 DIAGNOSIS — E118 Type 2 diabetes mellitus with unspecified complications: Secondary | ICD-10-CM

## 2015-09-18 DIAGNOSIS — C9210 Chronic myeloid leukemia, BCR/ABL-positive, not having achieved remission: Secondary | ICD-10-CM | POA: Diagnosis not present

## 2015-09-18 LAB — COMPREHENSIVE METABOLIC PANEL (CC13)
ALT: 22 U/L (ref 0–55)
ANION GAP: 8 meq/L (ref 3–11)
AST: 15 U/L (ref 5–34)
Albumin: 4.2 g/dL (ref 3.5–5.0)
Alkaline Phosphatase: 55 U/L (ref 40–150)
BILIRUBIN TOTAL: 0.86 mg/dL (ref 0.20–1.20)
BUN: 22.6 mg/dL (ref 7.0–26.0)
CHLORIDE: 103 meq/L (ref 98–109)
CO2: 28 meq/L (ref 22–29)
Calcium: 9.7 mg/dL (ref 8.4–10.4)
Creatinine: 1.3 mg/dL — ABNORMAL HIGH (ref 0.6–1.1)
EGFR: 42 mL/min/{1.73_m2} — AB (ref >90)
Glucose: 207 mg/dl — ABNORMAL HIGH (ref 70–140)
Potassium: 4.9 mEq/L (ref 3.5–5.1)
Sodium: 139 mEq/L (ref 136–145)
Total Protein: 7 g/dL (ref 6.4–8.3)

## 2015-09-18 LAB — CBC WITH DIFFERENTIAL/PLATELET
BASO%: 1.8 % (ref 0.0–2.0)
Basophils Absolute: 0.2 10*3/uL — ABNORMAL HIGH (ref 0.0–0.1)
EOS ABS: 0.3 10*3/uL (ref 0.0–0.5)
EOS%: 2.4 % (ref 0.0–7.0)
HCT: 45.9 % (ref 34.8–46.6)
HGB: 14.8 g/dL (ref 11.6–15.9)
LYMPH%: 27.4 % (ref 14.0–49.7)
MCH: 26.8 pg (ref 25.1–34.0)
MCHC: 32.2 g/dL (ref 31.5–36.0)
MCV: 83 fL (ref 79.5–101.0)
MONO#: 0.8 10*3/uL (ref 0.1–0.9)
MONO%: 6.4 % (ref 0.0–14.0)
NEUT#: 7.5 10*3/uL — ABNORMAL HIGH (ref 1.5–6.5)
NEUT%: 62 % (ref 38.4–76.8)
PLATELETS: 329 10*3/uL (ref 145–400)
RBC: 5.53 10*6/uL — AB (ref 3.70–5.45)
RDW: 16.9 % — ABNORMAL HIGH (ref 11.2–14.5)
WBC: 12.1 10*3/uL — ABNORMAL HIGH (ref 3.9–10.3)
lymph#: 3.3 10*3/uL (ref 0.9–3.3)

## 2015-09-18 NOTE — Progress Notes (Signed)
Hematology and Oncology Follow Up Visit  Sara Warner [id number] [birth year] 74 y.o. 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 Diagnosed a bone marrow biopsy on 10/24/2012.   Current treatment: Tasigna 300 mg bid started 11/2012 and she has complete hematological response.   Interim History: Sara Warner presents today for a follow up visit by her self. Since her last visit, she continues to do well. She takes Tasigna without any new complications. She rarely mass doses and very compliant with taking her medication. She continues to be very independent and attends to activities of daily living. Stable to drive and takes care of her own affairs. She did notice at times her blood sugar have ran high but a lot of it has to do with certain diets that she eats.  She  does not report any fevers, does not report any chills, does not report any lymphadenopathy. She has not had any constitutional symptoms. She does not report any back pain or bony pain. She has not reported any recent hospitalizations or illnesses. She does not report any chest pain but does report occasional palpitations. She does not report any dyspnea on exertion or leg edema. She does not report any cough or hemoptysis. He does not report any nausea, vomiting, abdominal pain. She does not report any hematochezia or melena. The remaining review of systems unremarkable.   Medications: I have reviewed the patient's current medications. Current Outpatient Prescriptions  Medication Sig Dispense Refill  . aspirin EC 81 MG tablet Take 1 tablet (81 mg total) by mouth daily. 90 tablet 3  . clorazepate (TRANXENE-T) 7.5 MG tablet Take 1 tablet (7.5 mg total) by mouth 2 (two) times daily as needed for anxiety. 30 tablet 1  . digoxin (LANOXIN) 0.125 MG tablet Take 0.125 mg by mouth daily.    . furosemide (LASIX) 20 MG tablet Take 1 tablet (20 mg total) by mouth  daily. 30 tablet 1  . glipiZIDE (GLUCOTROL XL) 10 MG 24 hr tablet Take 10 mg by mouth 2 (two) times daily.    . Levothyroxine Sodium (SYNTHROID PO) Take 125 mcg by mouth daily.     . metoprolol succinate (TOPROL-XL) 50 MG 24 hr tablet Take 1.5 tablets daily 45 tablet 6  . Mouthwashes (MOUTHWASH/GARGLE) LIQD Swish for 30 second and spit out tid  2  . nilotinib (TASIGNA) 150 MG capsule TAKE 2 CAPSULES BY MOUTH EVERY 12 HOURS 112 capsule 0  . omeprazole (PRILOSEC) 40 MG capsule Take 40 mg by mouth daily.  2  . potassium chloride SA (K-DUR,KLOR-CON) 20 MEQ tablet Take 1 tablet (20 mEq total) by mouth daily. 30 tablet 1  . SYNTHROID 150 MCG tablet Take 150 mcg by mouth daily.  1  . [DISCONTINUED] lisinopril (ZESTRIL) 2.5 MG tablet Take 1 tablet (2.5 mg total) by mouth daily. 90 tablet 4   No current facility-administered medications for this visit.    Allergies: No Known Allergies  Past Medical History, Surgical history, Social history, and Family History were reviewed and updated.   Physical Exam: Blood pressure 140/77, pulse 86, temperature 98 F (36.7 C), temperature source Oral, resp. rate 17, height 5' 8"  (1.727 m), weight 190 lb (86.183 kg), SpO2 98 %. ECOG: 1 General appearance: alert awake well-appearing woman without distress. Head: Normocephalic, without obvious abnormality no oral ulcers or lesions. Neck: no adenopathy Lymph nodes: Cervical, supraclavicular, and axillary nodes normal. Heart:irregularly irregular rhythm no murmurs. Lung:chest clear,  no wheezing, rales, normal symmetric air entry. Abdomen: soft, non-tender, without masses or organomegaly EXT:no erythema, induration, or nodules Skin: No rashes or lesions.  Lab Results: Lab Results  Component Value Date   WBC 12.1* 09/18/2015   HGB 14.8 09/18/2015   HCT 45.9 09/18/2015   MCV 83.0 09/18/2015   PLT 329 09/18/2015     Chemistry      Component Value Date/Time   NA 140 05/30/2015 0932   NA 141 03/20/2015  0956   K 4.5 05/30/2015 0932   K 3.8 03/20/2015 0956   CL 104 05/30/2015 0932   CL 106 04/26/2013 1252   CO2 26 05/30/2015 0932   CO2 22 03/20/2015 0956   BUN 17 05/30/2015 0932   BUN 14.3 03/20/2015 0956   CREATININE 0.80 05/30/2015 0932   CREATININE 0.8 03/20/2015 0956   CREATININE 0.82 05/24/2012 1004      Component Value Date/Time   CALCIUM 9.1 05/30/2015 0932   CALCIUM 8.9 03/20/2015 0956   ALKPHOS 53 05/30/2015 0932   ALKPHOS 63 03/20/2015 0956   AST 19 05/30/2015 0932   AST 22 03/20/2015 0956   ALT 20 05/30/2015 0932   ALT 28 03/20/2015 0956   BILITOT 0.7 05/30/2015 0932   BILITOT 0.96 03/20/2015 0956         Impression and Plan:   This is a pleasant 74 year old with:  1. CML.  Chronic phase at this time. On Tasigna since 11/2012 with normalization of her her WBC. She is tolerating this medication well. She has achieved a complete hematological response. Last molecular studies done in October 2015 did not pick up on any  modalities.   The plan is to continue the same dose and schedule without any modifications. I will check molecular testing for bcr/abl  with the next visit.  2. Diabetes mellitus: She is currently on metformin without any complications. I continue to educate her about the potential hypoglycemia fact of Tasigna. She monitors her blood sugars periodically.  3. Cardiomyopathy: There is no evidence of exacerbation related to Tasigna.   4. Follow-up: In 6 months.      SHADAD,FIRAS 10/19/201610:21 AM

## 2015-09-18 NOTE — Telephone Encounter (Signed)
per pof to sch pt appt-gave pt copy of avs °

## 2015-10-02 ENCOUNTER — Ambulatory Visit (INDEPENDENT_AMBULATORY_CARE_PROVIDER_SITE_OTHER): Payer: Commercial Managed Care - HMO | Admitting: Podiatry

## 2015-10-02 ENCOUNTER — Encounter: Payer: Self-pay | Admitting: Podiatry

## 2015-10-02 ENCOUNTER — Encounter: Payer: Self-pay | Admitting: Cardiovascular Disease

## 2015-10-02 ENCOUNTER — Ambulatory Visit (INDEPENDENT_AMBULATORY_CARE_PROVIDER_SITE_OTHER): Payer: Commercial Managed Care - HMO | Admitting: Cardiovascular Disease

## 2015-10-02 VITALS — BP 147/82 | HR 76 | Resp 12

## 2015-10-02 VITALS — BP 138/72 | HR 90 | Ht 68.0 in | Wt 195.3 lb

## 2015-10-02 DIAGNOSIS — E039 Hypothyroidism, unspecified: Secondary | ICD-10-CM

## 2015-10-02 DIAGNOSIS — N183 Chronic kidney disease, stage 3 unspecified: Secondary | ICD-10-CM

## 2015-10-02 DIAGNOSIS — I429 Cardiomyopathy, unspecified: Secondary | ICD-10-CM

## 2015-10-02 DIAGNOSIS — L84 Corns and callosities: Secondary | ICD-10-CM | POA: Diagnosis not present

## 2015-10-02 DIAGNOSIS — E1142 Type 2 diabetes mellitus with diabetic polyneuropathy: Secondary | ICD-10-CM

## 2015-10-02 DIAGNOSIS — I4891 Unspecified atrial fibrillation: Secondary | ICD-10-CM | POA: Diagnosis not present

## 2015-10-02 DIAGNOSIS — C9210 Chronic myeloid leukemia, BCR/ABL-positive, not having achieved remission: Secondary | ICD-10-CM | POA: Diagnosis not present

## 2015-10-02 MED ORDER — METOPROLOL SUCCINATE ER 50 MG PO TB24: 50.0000 mg | ORAL_TABLET | Freq: Two times a day (BID) | ORAL | Status: DC

## 2015-10-02 NOTE — Progress Notes (Signed)
Patient ID: Sara Warner, female   DOB: [birth year], 74 y.o.   MRN: [id number]   subjective:  this patient presents for scheduled visit for debridement of pre-ulcerative callus on the plantar aspect left foot an 89 pre-ulcerative callus on the left foot at approximately 8 week intervals. Patient is wearing diabetic shoes with pocket accommodations  To offload the plantar left first MPJ   Objective:  well-organized large bleeding callus plantar left first MPJ that remains closed after debridement. There is no surrounding erythema, edema or warmth around this callused area   non-bleeding callus plantar right first MPJ   Assessment:  pre-ulcerative plantar callus left first MPJ   plantar callus right first MPJ  Diabetic peripheral neuropathy   Plan:  debridement of pre-ulcerative callus   As well as the plantar callus right without any bleeding   attach additional felt pad to offload plantar first MPJ on the diabetic insole of the left foot  Reappoint 3 months   reappoint 2 months

## 2015-10-02 NOTE — Patient Instructions (Signed)
Diabetes and Foot Care Diabetes may cause you to have problems because of poor blood supply (circulation) to your feet and legs. This may cause the skin on your feet to become thinner, break easier, and heal more slowly. Your skin may become dry, and the skin may peel and crack. You may also have nerve damage in your legs and feet causing decreased feeling in them. You may not notice minor injuries to your feet that could lead to infections or more serious problems. Taking care of your feet is one of the most important things you can do for yourself.  HOME CARE INSTRUCTIONS  Wear shoes at all times, even in the house. Do not go barefoot. Bare feet are easily injured.  Check your feet daily for blisters, cuts, and redness. If you cannot see the bottom of your feet, use a mirror or ask someone for help.  Wash your feet with warm water (do not use hot water) and mild soap. Then pat your feet and the areas between your toes until they are completely dry. Do not soak your feet as this can dry your skin.  Apply a moisturizing lotion or petroleum jelly (that does not contain alcohol and is unscented) to the skin on your feet and to dry, brittle toenails. Do not apply lotion between your toes.  Trim your toenails straight across. Do not dig under them or around the cuticle. File the edges of your nails with an emery board or nail file.  Do not cut corns or calluses or try to remove them with medicine.  Wear clean socks or stockings every day. Make sure they are not too tight. Do not wear knee-high stockings since they may decrease blood flow to your legs.  Wear shoes that fit properly and have enough cushioning. To break in new shoes, wear them for just a few hours a day. This prevents you from injuring your feet. Always look in your shoes before you put them on to be sure there are no objects inside.  Do not cross your legs. This may decrease the blood flow to your feet.  If you find a minor scrape,  cut, or break in the skin on your feet, keep it and the skin around it clean and dry. These areas may be cleansed with mild soap and water. Do not cleanse the area with peroxide, alcohol, or iodine.  When you remove an adhesive bandage, be sure not to damage the skin around it.  If you have a wound, look at it several times a day to make sure it is healing.  Do not use heating pads or hot water bottles. They may burn your skin. If you have lost feeling in your feet or legs, you may not know it is happening until it is too late.  Make sure your health care provider performs a complete foot exam at least annually or more often if you have foot problems. Report any cuts, sores, or bruises to your health care provider immediately. SEEK MEDICAL CARE IF:   You have an injury that is not healing.  You have cuts or breaks in the skin.  You have an ingrown nail.  You notice redness on your legs or feet.  You feel burning or tingling in your legs or feet.  You have pain or cramps in your legs and feet.  Your legs or feet are numb.  Your feet always feel cold. SEEK IMMEDIATE MEDICAL CARE IF:   There is increasing redness,   swelling, or pain in or around a wound.  There is a red line that goes up your leg.  Pus is coming from a wound.  You develop a fever or as directed by your health care provider.  You notice a bad smell coming from an ulcer or wound.   This information is not intended to replace advice given to you by your health care provider. Make sure you discuss any questions you have with your health care provider.   Document Released: 11/13/2000 Document Revised: 07/19/2013 Document Reviewed: 04/25/2013 [employer] Interactive Patient Education 2016 [employer]  

## 2015-10-02 NOTE — Progress Notes (Signed)
Patient ID: Sara Warner, female   DOB: 27-Mar-1941, 74 y.o.   MRN: [id number]     HPI: [name] is a 74 year old female who presents to the office today for a 3 month followup cardiology evaluation.   Sara Warner is a former patient of Dr. Doreatha Lew and in February 2013 saw Dr. Stanford Breed for one visit. Sara Warner has a history of permanent atrial fibrillation and admits to being in atrial fibrillation for >15 years. Sara Warner had problems with recurrent epistaxis in the past on Coumadin and consequently has not been on anticoagulation therapy. An echo Doppler study in 2008 showed an ejection fraction of 30-35%. A Myoview in February 2009 showed no ischemia but mild global reduction in LV function with an ejection fraction of 47%. An echo in 2010  showed low normal LV function with mild LA enlargement and mild mitral regurgitation and tricuspid regurgitation.  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 Sara Warner does also have issues with thyroid abnormality. Sara Warner has varicose veins and notes right lower extremity swelling.   When  I initially saw Sara Warner in 2014, we discussed potential thromboembolic risk associated with atrial fibrillation. Sara Warner has not been on any coagulation due to frequent nosebleeds requiring cauterization and does have CML. Sara Warner did have a significant remote tobacco history of 2 packs per day for over 20 years. Sara Warner had nonspecific ST changes inferolaterally on Sara Warner EKG. A nuclear perfusion scan  on 07/05/2013 was not gated to Sara Warner atrial fibrillation and was a low risk study demonstrating mild artifact. There was no scar or ischemia. A 2-D echo Doppler study showed normal systolic function and Doppler parameters suggested increased mean left atrial pressure. Sara Warner is systolic bowing of Sara Warner mitral valve leaflet without definitive prolapse with mild MR. Sara Warner left atrium was moderate to severely dilated and Sara Warner right atrium was moderately dilated. There was mild pulmonary hypertension with PA pressure  estimated at 34 mm.  Sara Warner was given a prescription for verapamil for rate control by Cyndi Bender, PA, but stopped this secondary to develop leg edema.  Remotely, Sara Warner had been on carvedilol. When  I saw Sara Warner 2 months ago, Sara Warner was on digoxin 0.125 mg for only rate control medication, Lasix 20 mg for leg edema.  Sara Warner has type 2 diabetes mellitus and is on Glucotrol 10 mg.  Sara Warner is on Synthroid 150 g for hypothyroidism. Sara Warner is taking to [facility] for CML.  At that time, I initiated therapy with Toprol-XL 25 mg for 2 days and recommended titration up to 50 mg.  Sara Warner underwent laboratory which revealed mildly microcytic indices with MCV of 77 and hemoglobin of 12.5 and hematocrit 40.2.  Fasting glucose was 125.  Sara Warner had normal renal function and LFTs.  Sara Warner digoxin level was 0.7.  Magnesium 2.1.  Lipid studies reveals an excellent total cholesterol 128, triglycerides 110, and LDL cholesterol 79.  Sara Warner HDL was low at 27.  Showed normal TSH at 1.92.  A  follow-up echo Doppler study on 05/30/2015 revealed an ejection fraction of 35-40%.  There was diffuse hypokinesis with suggestion of akinesis involving the basal to mid inferoseptal wall.  There was aortic valve sclerosis without stenosis, and mild-to-moderate mitral and tricuspid regurgitation.  Sara Warner left atrium was moderately dilated.  There was mild pulmonary hypertension with estimated PA pressure 37 mm.  Since I last saw Sara Warner,  Sara Warner denies chest pain.  Sara Warner denies PND, orthopnea.  Sara Warner denies bleeding.  Sara Warner recently had lab work by Sara Warner oncologist.  Sara Warner creatinine  had risen to 1.3.  Glucose was increased at 150 on that certain if this was fasting.  Sara Warner white blood count was 12.1.  Sara Warner presents for reevaluation.  Past Medical History  Diagnosis Date  . Arrhythmia     chronic atrial fib  . Toxic goiter   . Ejection fraction < 50%   . Hypothyroidism   . Atrial fibrillation ([place])   . CHF (congestive heart failure) ([place])   . Elevated WBC count   . CML (chronic  myelocytic leukemia) ([place]) 01/10/2013  . Atrial fibrillation [facility])     Past Surgical History  Procedure Laterality Date  . Cystectomy    . Hysterotomy    . Cardioversion      No Known Allergies  Current Outpatient Prescriptions  Medication Sig Dispense Refill  . aspirin EC 81 MG tablet Take 1 tablet (81 mg total) by mouth daily. 90 tablet 3  . clorazepate (TRANXENE-T) 7.5 MG tablet Take 1 tablet (7.5 mg total) by mouth 2 (two) times daily as needed for anxiety. 30 tablet 1  . digoxin (LANOXIN) 0.125 MG tablet Take 0.125 mg by mouth daily.    . furosemide (LASIX) 20 MG tablet Take 1 tablet (20 mg total) by mouth daily. 30 tablet 1  . glipiZIDE (GLUCOTROL XL) 10 MG 24 hr tablet Take 10 mg by mouth 2 (two) times daily.    . Levothyroxine Sodium (SYNTHROID PO) Take 125 mcg by mouth daily.     . metoprolol succinate (TOPROL-XL) 50 MG 24 hr tablet Take 1 tablet (50 mg total) by mouth 2 (two) times daily. 60 tablet 6  . Mouthwashes (MOUTHWASH/GARGLE) LIQD Swish for 30 second and spit out tid  2  . nilotinib (TASIGNA) 150 MG capsule TAKE 2 CAPSULES BY MOUTH EVERY 12 HOURS 112 capsule 0  . omeprazole (PRILOSEC) 40 MG capsule Take 40 mg by mouth daily.  2  . potassium chloride SA (K-DUR,KLOR-CON) 20 MEQ tablet Take 1 tablet (20 mEq total) by mouth daily. 30 tablet 1  . SYNTHROID 150 MCG tablet Take 150 mcg by mouth daily.  1  . [DISCONTINUED] lisinopril (ZESTRIL) 2.5 MG tablet Take 1 tablet (2.5 mg total) by mouth daily. 90 tablet 4   No current facility-administered medications for this visit.    Socially Sara Warner is widowed for 23 years. Sara Warner has 5 children 15 grandchildren and 17 great grandchildren. Sara Warner lives at home with Sara Warner 2 grandchildren. Sara Warner is retired. Sara Warner completed ninth grade of education. Sara Warner is a former smoker but quit smoking in 1987 there prior to that Sara Warner had smoked up to 2 packs per day for 20 years. There is no alcohol use.  Family History  Problem Relation Age of Onset  .  Cancer - Colon Mother     mast to lungs  . Diabetes Father   . Diabetes Paternal Grandmother     ROS General: Negative; No fevers, chills, or night sweats;  HEENT: Negative; No changes in vision or hearing, sinus congestion, difficulty swallowing Pulmonary: Negative; No cough, wheezing, shortness of breath, hemoptysis Cardiovascular: Negative; No chest pain, presyncope, syncope, palpitations Occasional leg swelling GI: Negative; No nausea, vomiting, diarrhea, or abdominal pain GU: Negative; No dysuria, hematuria, or difficulty voiding Musculoskeletal: Negative; no myalgias, joint pain, or weakness Hematologic/Oncology: Positive for CML Endocrine: Positive for hypothyroidism and diabetes Neuro: Negative; no changes in balance, headaches Skin: Negative; No rashes or skin lesions Psychiatric: Negative; No behavioral problems, depression Sleep: Negative; No snoring, daytime sleepiness, hypersomnolence, bruxism, restless  legs, hypnogognic hallucinations, no cataplexy Other comprehensive 14 point system review is negative.  PE BP 138/72 mmHg  Pulse 90  Ht 5' 8"  (1.727 m)  Wt 195 lb 4.8 oz (88.587 kg)  BMI 29.70 kg/m2  Repeat blood pressure by me 140/76  Wt Readings from Last 3 Encounters:  10/02/15 195 lb 4.8 oz (88.587 kg)  09/18/15 190 lb (86.183 kg)  07/10/15 195 lb 1 oz (88.48 kg)   General: Alert, oriented, no distress.  Skin: normal turgor, no rashes HEENT: Normocephalic, atraumatic. Pupils round and reactive; sclera anicteric; Fundi without hemorrhages or exudates. Nose without nasal septal hypertrophy Mouth/Parynx benign; Mallinpatti scale 3 Neck: No JVD, no carotid bruits with normal carotid upstroke Chest wall: Nontender to palpation Lungs: clear to ausculatation and percussion; no wheezing or rales Heart:  atrial fibrillation with a ventricular rate in the 22G;   1/6 systolic murmur.  No diastolic murmur Abdomen: soft, nontender; no hepatosplenomehaly, BS+; abdominal  aorta nontender and not dilated by palpation. Back: No CVA tenderness Pulses 2+ Extremities: Mild varicosities, trace right lower extremity edema, no clubbinbg cyanosis, Homan's sign negative  Neurologic: grossly nonfocal Psychological: Normal affect and mood  ECG (independently read by me): Atrial fibrillation with a ventricular rate in the 90s.  LVH by voltage.  Inferolateral ST-T changes possibly contributed by digoxin.  August 2016 ECG (independently read by me): Atrial fibrillation with rate in the 80s.  Inferolateral ST-T changes.  QTc interval 420 ms.  June 2016 ECG (independently read by me): Atrial fibrillation with rapid ventricular response with a rate at 1 21 bpm.  Nonspecific inferolateral ST segment changes.  LABS:  BMP Latest Ref Rng 09/18/2015 05/30/2015 03/20/2015  Glucose 70 - 140 mg/dl 207(H) 125(H) 183(H)  BUN 7.0 - 26.0 mg/dL 22.6 17 14.3  Creatinine 0.6 - 1.1 mg/dL 1.3(H) 0.80 0.8  Sodium 136 - 145 mEq/L 139 140 141  Potassium 3.5 - 5.1 mEq/L 4.9 4.5 3.8  Chloride 96 - 112 mEq/L - 104 -  CO2 22 - 29 mEq/L 28 26 22   Calcium 8.4 - 10.4 mg/dL 9.7 9.1 8.9   Hepatic Function Latest Ref Rng 09/18/2015 05/30/2015 03/20/2015  Total Protein 6.4 - 8.3 g/dL 7.0 6.6 7.0  Albumin 3.5 - 5.0 g/dL 4.2 4.1 4.2  AST 5 - 34 U/L 15 19 22   ALT 0 - 55 U/L 22 20 28   Alk Phosphatase 40 - 150 U/L 55 53 63  Total Bilirubin 0.20 - 1.20 mg/dL 0.86 0.7 0.96   CBC Latest Ref Rng 09/18/2015 05/30/2015 03/20/2015  WBC 3.9 - 10.3 10e3/uL 12.1(H) 12.9(H) 10.6(H)  Hemoglobin 11.6 - 15.9 g/dL 14.8 12.5 12.5  Hematocrit 34.8 - 46.6 % 45.9 40.2 39.9  Platelets 145 - 400 10e3/uL 329 422(H) 321   Lab Results  Component Value Date   MCV 83.0 09/18/2015   MCV 77.9* 05/30/2015   MCV 78.4* 03/20/2015   Lab Results  Component Value Date   TSH 1.932 05/30/2015  No results found for: HGBA1C   Lipid Panel     Component Value Date/Time   CHOL 128 05/30/2015 0932   TRIG 110 05/30/2015 0932   HDL  27* 05/30/2015 0932   CHOLHDL 4.7 05/30/2015 0932   VLDL 22 05/30/2015 0932   LDLCALC 79 05/30/2015 0932    RADIOLOGY: No results found.   ASSESSMENT AND PLAN: Ms. Warner is a 74 year old female with a history of permanent atrial fibrillation for over 15 years duration.. Sara Warner has not been on anticoagulation due  to history of frequent nosebleeds in the past requiring cauterization.  We again discussed the potential for CVA risk with atrial fibrillation compounding over the years.  Sara Warner still prefers against initiating anticoagulation, whether with warfarin or with the newer agents.  Sara Warner is on aspirin.  Sara Warner also has been diagnosed with CML and is undergoing treatment. Sara Warner last nuclear study  against an ischemic etiology to Sara Warner ECG changes.  Sara Warner resting pulse today is still around 90 despite increasing Sara Warner Toprol dose at Sara Warner last office visit.  I will now further titrate this to 100 mg daily and Sara Warner can take this either once a day or 50 mg twice a day regimen.  Sara Warner blood pressure today is upper normal.  Sara Warner is on Synthroid 150 g for thyroid replacement and Sara Warner most recent TSH is normal.  There is no edema on Sara Warner current dose of Lasix.  I reviewed Sara Warner echo Doppler data, which essentially is unchanged and continues to show an ejection fraction in the 35-40% range.  Sara Warner does have mild to moderate mitral and tricuspid regurgitation and mild pulmonary hypertension.  Sara Warner will monitor Sara Warner blood pressure.  Sara Warner is aware of increased stroke risk without anticoagulation.  Sara Warner has GERD and this is controlled with Sara Warner current dose of omeprazole.  Sara Warner has type 2 diabetes mellitus on glipizide 10 mg daily.  I will see Sara Warner in February 2017 for follow up evaluation.    Time spent: 25 minutes  Troy Sine, MD, [facility] 10/02/2015 11:11 AM

## 2015-10-02 NOTE — Patient Instructions (Signed)
Your physician has recommended you make the following change in your medication: the metoprolol has been increased to 50 mg twice a day. A new prescription has been sent to your pharmacy to reflect this change.   Your physician recommends that you schedule a follow-up appointment in: February.2017.  If you need a refill on your cardiac medications before your next appointment, please call your pharmacy.

## 2015-10-07 ENCOUNTER — Other Ambulatory Visit: Payer: Self-pay | Admitting: Oncology

## 2015-10-31 ENCOUNTER — Other Ambulatory Visit: Payer: Self-pay | Admitting: Oncology

## 2015-11-28 ENCOUNTER — Other Ambulatory Visit: Payer: Self-pay | Admitting: Oncology

## 2015-12-30 ENCOUNTER — Other Ambulatory Visit: Payer: Self-pay | Admitting: Oncology

## 2015-12-30 MED FILL — *TASIGNA 150 MG CAPSULES: 150 | 28 days supply | Qty: 112 | Fill #0

## 2016-01-01 ENCOUNTER — Ambulatory Visit (INDEPENDENT_AMBULATORY_CARE_PROVIDER_SITE_OTHER): Payer: PPO | Admitting: Podiatry

## 2016-01-01 DIAGNOSIS — L84 Corns and callosities: Secondary | ICD-10-CM

## 2016-01-01 DIAGNOSIS — E1142 Type 2 diabetes mellitus with diabetic polyneuropathy: Secondary | ICD-10-CM

## 2016-01-01 NOTE — Patient Instructions (Signed)
Diabetes and Foot Care Diabetes may cause you to have problems because of poor blood supply (circulation) to your feet and legs. This may cause the skin on your feet to become thinner, break easier, and heal more slowly. Your skin may become dry, and the skin may peel and crack. You may also have nerve damage in your legs and feet causing decreased feeling in them. You may not notice minor injuries to your feet that could lead to infections or more serious problems. Taking care of your feet is one of the most important things you can do for yourself.  HOME CARE INSTRUCTIONS  Wear shoes at all times, even in the house. Do not go barefoot. Bare feet are easily injured.  Check your feet daily for blisters, cuts, and redness. If you cannot see the bottom of your feet, use a mirror or ask someone for help.  Wash your feet with warm water (do not use hot water) and mild soap. Then pat your feet and the areas between your toes until they are completely dry. Do not soak your feet as this can dry your skin.  Apply a moisturizing lotion or petroleum jelly (that does not contain alcohol and is unscented) to the skin on your feet and to dry, brittle toenails. Do not apply lotion between your toes.  Trim your toenails straight across. Do not dig under them or around the cuticle. File the edges of your nails with an emery board or nail file.  Do not cut corns or calluses or try to remove them with medicine.  Wear clean socks or stockings every day. Make sure they are not too tight. Do not wear knee-high stockings since they may decrease blood flow to your legs.  Wear shoes that fit properly and have enough cushioning. To break in new shoes, wear them for just a few hours a day. This prevents you from injuring your feet. Always look in your shoes before you put them on to be sure there are no objects inside.  Do not cross your legs. This may decrease the blood flow to your feet.  If you find a minor scrape,  cut, or break in the skin on your feet, keep it and the skin around it clean and dry. These areas may be cleansed with mild soap and water. Do not cleanse the area with peroxide, alcohol, or iodine.  When you remove an adhesive bandage, be sure not to damage the skin around it.  If you have a wound, look at it several times a day to make sure it is healing.  Do not use heating pads or hot water bottles. They may burn your skin. If you have lost feeling in your feet or legs, you may not know it is happening until it is too late.  Make sure your health care provider performs a complete foot exam at least annually or more often if you have foot problems. Report any cuts, sores, or bruises to your health care provider immediately. SEEK MEDICAL CARE IF:   You have an injury that is not healing.  You have cuts or breaks in the skin.  You have an ingrown nail.  You notice redness on your legs or feet.  You feel burning or tingling in your legs or feet.  You have pain or cramps in your legs and feet.  Your legs or feet are numb.  Your feet always feel cold. SEEK IMMEDIATE MEDICAL CARE IF:   There is increasing redness,   swelling, or pain in or around a wound.  There is a red line that goes up your leg.  Pus is coming from a wound.  You develop a fever or as directed by your health care provider.  You notice a bad smell coming from an ulcer or wound.   This information is not intended to replace advice given to you by your health care provider. Make sure you discuss any questions you have with your health care provider.   Document Released: 11/13/2000 Document Revised: 07/19/2013 Document Reviewed: 04/25/2013 [employer] Interactive Patient Education 2016 [employer]  

## 2016-01-02 NOTE — Progress Notes (Signed)
Patient ID: Sara Warner, female   DOB: May 07, 1941, 75 y.o.   MRN: [id number]  Subjective: This patient presents again today for scheduled visit for ongoing debridement of pre-ulcerative callus sub-left first MPJ and debridement of plantar callus right. 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

## 2016-01-07 ENCOUNTER — Ambulatory Visit (INDEPENDENT_AMBULATORY_CARE_PROVIDER_SITE_OTHER): Payer: PPO | Admitting: Cardiovascular Disease

## 2016-01-07 ENCOUNTER — Encounter: Payer: Self-pay | Admitting: Cardiovascular Disease

## 2016-01-07 VITALS — BP 158/76 | HR 78 | Ht 68.0 in | Wt 200.0 lb

## 2016-01-07 DIAGNOSIS — I4891 Unspecified atrial fibrillation: Secondary | ICD-10-CM | POA: Diagnosis not present

## 2016-01-07 DIAGNOSIS — E039 Hypothyroidism, unspecified: Secondary | ICD-10-CM

## 2016-01-07 DIAGNOSIS — C9210 Chronic myeloid leukemia, BCR/ABL-positive, not having achieved remission: Secondary | ICD-10-CM

## 2016-01-07 DIAGNOSIS — E119 Type 2 diabetes mellitus without complications: Secondary | ICD-10-CM | POA: Diagnosis not present

## 2016-01-07 MED ORDER — METOPROLOL SUCCINATE ER 50 MG PO TB24: 50.0000 mg | ORAL_TABLET | Freq: Two times a day (BID) | ORAL | Status: DC

## 2016-01-07 NOTE — Patient Instructions (Signed)
Your physician wants you to follow-up in: 6 months or sooner if needed. You will receive a reminder letter in the mail two months in advance. If you don't receive a letter, please call our office to schedule the follow-up appointment. 

## 2016-01-08 ENCOUNTER — Encounter: Payer: Self-pay | Admitting: Cardiovascular Disease

## 2016-01-08 NOTE — Progress Notes (Signed)
Patient ID: Sara Warner, female   DOB: 1941/09/23, 75 y.o.   MRN: [id number]     HPI: [name] is a 75 year old female who presents to the office today for a 3 month followup cardiology evaluation.   Sara Warner is a former patient of Dr. Doreatha Warner. She has a history of permanent atrial fibrillation and admits to being in atrial fibrillation for >15 years. She had problems with recurrent epistaxis in the past on Coumadin and consequently has not been on anticoagulation therapy. An echo Doppler study in 2008 showed an ejection fraction of 30-35%. A Myoview in February 2009 showed no ischemia but mild global reduction in LV function with an ejection fraction of 47%. An echo in 2010  showed low normal LV function with mild LA enlargement and mild mitral regurgitation and tricuspid regurgitation.  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 She does also have issues with thyroid abnormality. She has varicose veins and notes right lower extremity swelling.   When  I initially saw her in 2014, we discussed potential thromboembolic risk associated with atrial fibrillation. She has not been on any coagulation due to frequent nosebleeds requiring cauterization and does have CML. She did have a significant remote tobacco history of 2 packs per day for over 20 years. She had nonspecific ST changes inferolaterally on her EKG. A nuclear perfusion scan  on 07/05/2013 was not gated to her atrial fibrillation and was a low risk study demonstrating mild artifact. There was no scar or ischemia. A 2-D echo Doppler study showed normal systolic function and Doppler parameters suggested increased mean left atrial pressure. She is systolic bowing of her mitral valve leaflet without definitive prolapse with mild MR. Her left atrium was moderate to severely dilated and her right atrium was moderately dilated. There was mild pulmonary hypertension with PA pressure estimated at 34 mm.   A  follow-up echo Doppler study on  05/30/2015 revealed an ejection fraction of 35-40%.  There was diffuse hypokinesis with suggestion of akinesis involving the basal to mid inferoseptal wall.  There was aortic valve sclerosis without stenosis, and mild-to-moderate mitral and tricuspid regurgitation.  Her left atrium was moderately dilated.  There was mild pulmonary hypertension with estimated PA pressure 37 mm.  She has been on rate control for atrial fibrillation and most recently is on digoxin 0.125 mg daily and Toprol-XL 50 mg twice a day.  Remotely, she had developed significant lower extremity edema on verapamil. She is diabetic on Glucotrol XL.  She has a history of hypothyroidism on Synthroid 125 g.  GERD on Prilosec.  There also is a remote history of microcytic indices.  She is on the Sara Warner for her CML.   he denies chest pain. She is unaware of palpitations. She denies presyncope or syncope. She will be having blood work this week at [facility].  Past Medical History  Diagnosis Date  . Arrhythmia     chronic atrial fib  . Toxic goiter   . Ejection fraction < 50%   . Hypothyroidism   . Atrial fibrillation ([place])   . CHF (congestive heart failure) ([place])   . Elevated WBC count   . CML (chronic myelocytic leukemia) ([place]) 01/10/2013  . Atrial fibrillation [facility])     Past Surgical History  Procedure Laterality Date  . Cystectomy    . Hysterotomy    . Cardioversion      No Known Allergies  Current Outpatient Prescriptions  Medication Sig Dispense Refill  . aspirin EC 81  MG tablet Take 1 tablet (81 mg total) by mouth daily. 90 tablet 3  . clorazepate (TRANXENE-T) 7.5 MG tablet Take 1 tablet (7.5 mg total) by mouth 2 (two) times daily as needed for anxiety. 30 tablet 1  . digoxin (LANOXIN) 0.125 MG tablet Take 0.125 mg by mouth daily.    . furosemide (LASIX) 20 MG tablet Take 1 tablet (20 mg total) by mouth daily. 30 tablet 1  . glipiZIDE (GLUCOTROL XL) 10 MG 24 hr tablet Take 10 mg by mouth 2 (two) times daily.     . Levothyroxine Sodium (SYNTHROID PO) Take 125 mcg by mouth daily.     . metoprolol succinate (TOPROL-XL) 50 MG 24 hr tablet Take 1 tablet (50 mg total) by mouth 2 (two) times daily. 180 tablet 3  . Mouthwashes (MOUTHWASH/GARGLE) LIQD Swish for 30 second and spit out tid  2  . omeprazole (PRILOSEC) 40 MG capsule Take 40 mg by mouth daily.  2  . potassium chloride SA (K-DUR,KLOR-CON) 20 MEQ tablet Take 1 tablet (20 mEq total) by mouth daily. 30 tablet 1  . SYNTHROID 150 MCG tablet Take 150 mcg by mouth daily.  1  . TASIGNA 150 MG capsule TAKE 2 CAPSULES BY MOUTH EVERY 12 HOURS 112 capsule 0  . [DISCONTINUED] lisinopril (ZESTRIL) 2.5 MG tablet Take 1 tablet (2.5 mg total) by mouth daily. 90 tablet 4   No current facility-administered medications for this visit.    Socially she is widowed for 23 years. She has 5 children 15 grandchildren and 17 great grandchildren. She lives at home with her 2 grandchildren. She is retired. She completed ninth grade of education. She is a former smoker but quit smoking in 1987 there prior to that she had smoked up to 2 packs per day for 20 years. There is no alcohol use.  Family History  Problem Relation Age of Onset  . Cancer - Colon Mother     mast to lungs  . Diabetes Father   . Diabetes Paternal Grandmother     ROS General: Negative; No fevers, chills, or night sweats;  HEENT: Negative; No changes in vision or hearing, sinus congestion, difficulty swallowing Pulmonary: Negative; No cough, wheezing, shortness of breath, hemoptysis Cardiovascular: Negative; No chest pain, presyncope, syncope, palpitations Occasional leg swelling GI: Negative; No nausea, vomiting, diarrhea, or abdominal pain GU: Negative; No dysuria, hematuria, or difficulty voiding Musculoskeletal: Negative; no myalgias, joint pain, or weakness Hematologic/Oncology: Positive for CML Endocrine: Positive for hypothyroidism and diabetes Neuro: Negative; no changes in balance,  headaches Skin: Negative; No rashes or skin lesions Psychiatric: Negative; No behavioral problems, depression Sleep: Negative; No snoring, daytime sleepiness, hypersomnolence, bruxism, restless legs, hypnogognic hallucinations, no cataplexy Other comprehensive 14 point system review is negative.  PE BP 158/76 mmHg  Pulse 78  Ht 5' 8"  (1.727 m)  Wt 200 lb (90.719 kg)  BMI 30.42 kg/m2  Repeat blood pressure by me 130/78  Wt Readings from Last 3 Encounters:  01/07/16 200 lb (90.719 kg)  10/02/15 195 lb 4.8 oz (88.587 kg)  09/18/15 190 lb (86.183 kg)   General: Alert, oriented, no distress.  Skin: normal turgor, no rashes HEENT: Normocephalic, atraumatic. Pupils round and reactive; sclera anicteric; Fundi without hemorrhages or exudates. Nose without nasal septal hypertrophy Mouth/Parynx benign; Mallinpatti scale 3 Neck: No JVD, no carotid bruits with normal carotid upstroke Chest wall: Nontender to palpation Lungs: clear to ausculatation and percussion; no wheezing or rales Heart:  atrial fibrillation with a ventricular rate in  the 48G;   1/6 systolic murmur.  No diastolic murmur Abdomen: soft, nontender; no hepatosplenomehaly, BS+; abdominal aorta nontender and not dilated by palpation. Back: No CVA tenderness Pulses 2+ Extremities: Mild varicosities, trace right lower extremity edema, no clubbinbg cyanosis, Homan's sign negative  Neurologic: grossly nonfocal Psychological: Normal affect and mood  ECG (independently read by me):  Atrial fibrillation with ventricular rate at 78 bpm.  Nonspecific ST changes, probably secondary to digoxin effect. Normal QTc interval.  November 2016 ECG (independently read by me): Atrial fibrillation with a ventricular rate in the 90s.  LVH by voltage.  Inferolateral ST-T changes possibly contributed by digoxin.  August 2016 ECG (independently read by me): Atrial fibrillation with rate in the 80s.  Inferolateral ST-T changes.  QTc interval 420  ms.  June 2016 ECG (independently read by me): Atrial fibrillation with rapid ventricular response with a rate at 1 21 bpm.  Nonspecific inferolateral ST segment changes.  LABS:  BMP Latest Ref Rng 09/18/2015 05/30/2015 03/20/2015  Glucose 70 - 140 mg/dl 207(H) 125(H) 183(H)  BUN 7.0 - 26.0 mg/dL 22.6 17 14.3  Creatinine 0.6 - 1.1 mg/dL 1.3(H) 0.80 0.8  Sodium 136 - 145 mEq/L 139 140 141  Potassium 3.5 - 5.1 mEq/L 4.9 4.5 3.8  Chloride 96 - 112 mEq/L - 104 -  CO2 22 - 29 mEq/L 28 26 22   Calcium 8.4 - 10.4 mg/dL 9.7 9.1 8.9   Hepatic Function Latest Ref Rng 09/18/2015 05/30/2015 03/20/2015  Total Protein 6.4 - 8.3 g/dL 7.0 6.6 7.0  Albumin 3.5 - 5.0 g/dL 4.2 4.1 4.2  AST 5 - 34 U/L 15 19 22   ALT 0 - 55 U/L 22 20 28   Alk Phosphatase 40 - 150 U/L 55 53 63  Total Bilirubin 0.20 - 1.20 mg/dL 0.86 0.7 0.96   CBC Latest Ref Rng 09/18/2015 05/30/2015 03/20/2015  WBC 3.9 - 10.3 10e3/uL 12.1(H) 12.9(H) 10.6(H)  Hemoglobin 11.6 - 15.9 g/dL 14.8 12.5 12.5  Hematocrit 34.8 - 46.6 % 45.9 40.2 39.9  Platelets 145 - 400 10e3/uL 329 422(H) 321   Lab Results  Component Value Date   MCV 83.0 09/18/2015   MCV 77.9* 05/30/2015   MCV 78.4* 03/20/2015   Lab Results  Component Value Date   TSH 1.932 05/30/2015  No results found for: HGBA1C   Lipid Panel     Component Value Date/Time   CHOL 128 05/30/2015 0932   TRIG 110 05/30/2015 0932   HDL 27* 05/30/2015 0932   CHOLHDL 4.7 05/30/2015 0932   VLDL 22 05/30/2015 0932   LDLCALC 79 05/30/2015 0932    RADIOLOGY: No results found.   ASSESSMENT AND PLAN: Ms. Warner is a 75 year old female who has a history of permanent atrial fibrillation for over 116 years duration.. She has not been on anticoagulation due to history of frequent nosebleeds in the past requiring cauterization.   She is aware of potential for CVA risk with atrial fibrillation compounding over the years.  She still prefers against initiating anticoagulation, whether with  warfarin or with the newer agents.  She is on aspirin.  She also has been diagnosed with CML and is undergoing treatment. When I last saw her several months ago, her resting pulse was increased and I further titrated her beta blocker therapy.  Her atrial fibrillation rate is better controlled now in the 70s.  Her ST segment changes are due to digoxin effect. Her blood pressure today initially was elevated, but was normal on repeat by me.  She denies any cold or heat intolerance and continues to be on levothyroxine for her hypothyroidism.  Her GERD is controlled. She will have blood work done this week and I ask that they be sent to me for my review.  I will see her in 6 months for reevaluation.  Time spent: 25 minutes  Troy Sine, MD, [facility] 01/08/2016 6:07 PM

## 2016-01-09 DIAGNOSIS — E890 Postprocedural hypothyroidism: Secondary | ICD-10-CM | POA: Diagnosis not present

## 2016-01-09 DIAGNOSIS — Z1231 Encounter for screening mammogram for malignant neoplasm of breast: Secondary | ICD-10-CM | POA: Diagnosis not present

## 2016-01-09 DIAGNOSIS — E119 Type 2 diabetes mellitus without complications: Secondary | ICD-10-CM | POA: Diagnosis not present

## 2016-01-09 DIAGNOSIS — I4891 Unspecified atrial fibrillation: Secondary | ICD-10-CM | POA: Diagnosis not present

## 2016-01-09 DIAGNOSIS — R609 Edema, unspecified: Secondary | ICD-10-CM | POA: Diagnosis not present

## 2016-01-09 DIAGNOSIS — I10 Essential (primary) hypertension: Secondary | ICD-10-CM | POA: Diagnosis not present

## 2016-01-09 DIAGNOSIS — Z6830 Body mass index (BMI) 30.0-30.9, adult: Secondary | ICD-10-CM | POA: Diagnosis not present

## 2016-01-09 DIAGNOSIS — G629 Polyneuropathy, unspecified: Secondary | ICD-10-CM | POA: Diagnosis not present

## 2016-01-14 DIAGNOSIS — Z1231 Encounter for screening mammogram for malignant neoplasm of breast: Secondary | ICD-10-CM | POA: Diagnosis not present

## 2016-01-27 ENCOUNTER — Other Ambulatory Visit: Payer: Self-pay | Admitting: Oncology

## 2016-01-28 ENCOUNTER — Other Ambulatory Visit: Payer: Self-pay | Admitting: *Deleted

## 2016-01-28 MED ORDER — NILOTINIB HCL 150 MG PO CAPS: 300.0000 mg | ORAL_CAPSULE | Freq: Two times a day (BID) | ORAL | Status: DC

## 2016-01-29 MED FILL — *TASIGNA 150 MG CAPSULES: 150 | 28 days supply | Qty: 112 | Fill #0

## 2016-02-01 ENCOUNTER — Other Ambulatory Visit: Payer: Self-pay | Admitting: Cardiovascular Disease

## 2016-02-17 DIAGNOSIS — H2513 Age-related nuclear cataract, bilateral: Secondary | ICD-10-CM | POA: Diagnosis not present

## 2016-02-18 ENCOUNTER — Ambulatory Visit: Payer: PPO | Admitting: *Deleted

## 2016-02-18 DIAGNOSIS — E1142 Type 2 diabetes mellitus with diabetic polyneuropathy: Secondary | ICD-10-CM

## 2016-02-18 NOTE — Progress Notes (Signed)
Patient ID: Sara Warner, female   DOB: September 26, 1941, 75 y.o.   MRN: [id number] Patient presents to be scanned and measured for diabetic shoes and inserts.

## 2016-02-25 ENCOUNTER — Other Ambulatory Visit: Payer: Self-pay | Admitting: Oncology

## 2016-02-25 DIAGNOSIS — E119 Type 2 diabetes mellitus without complications: Secondary | ICD-10-CM | POA: Diagnosis not present

## 2016-02-25 DIAGNOSIS — R310 Gross hematuria: Secondary | ICD-10-CM | POA: Diagnosis not present

## 2016-02-25 DIAGNOSIS — Z139 Encounter for screening, unspecified: Secondary | ICD-10-CM | POA: Diagnosis not present

## 2016-02-25 DIAGNOSIS — Z1389 Encounter for screening for other disorder: Secondary | ICD-10-CM | POA: Diagnosis not present

## 2016-02-26 DIAGNOSIS — R310 Gross hematuria: Secondary | ICD-10-CM | POA: Diagnosis not present

## 2016-02-26 DIAGNOSIS — Z0000 Encounter for general adult medical examination without abnormal findings: Secondary | ICD-10-CM | POA: Diagnosis not present

## 2016-02-26 MED FILL — *TASIGNA 150 MG CAPSULES: 150 | 28 days supply | Qty: 112 | Fill #0

## 2016-03-04 ENCOUNTER — Ambulatory Visit: Payer: PPO | Admitting: Podiatry

## 2016-03-04 DIAGNOSIS — R310 Gross hematuria: Secondary | ICD-10-CM | POA: Diagnosis not present

## 2016-03-16 DIAGNOSIS — R319 Hematuria, unspecified: Secondary | ICD-10-CM | POA: Diagnosis not present

## 2016-03-16 DIAGNOSIS — Z6830 Body mass index (BMI) 30.0-30.9, adult: Secondary | ICD-10-CM | POA: Diagnosis not present

## 2016-03-17 ENCOUNTER — Ambulatory Visit (INDEPENDENT_AMBULATORY_CARE_PROVIDER_SITE_OTHER): Payer: PPO | Admitting: Podiatry

## 2016-03-17 ENCOUNTER — Encounter: Payer: Self-pay | Admitting: Podiatry

## 2016-03-17 DIAGNOSIS — L84 Corns and callosities: Secondary | ICD-10-CM | POA: Diagnosis not present

## 2016-03-17 DIAGNOSIS — M21619 Bunion of unspecified foot: Secondary | ICD-10-CM | POA: Diagnosis not present

## 2016-03-17 DIAGNOSIS — M21611 Bunion of right foot: Secondary | ICD-10-CM

## 2016-03-17 DIAGNOSIS — E1142 Type 2 diabetes mellitus with diabetic polyneuropathy: Secondary | ICD-10-CM | POA: Diagnosis not present

## 2016-03-17 NOTE — Progress Notes (Signed)
Patient ID: Sara Warner, female   DOB: Feb 14, 1941, 75 y.o.   MRN: [id number]     Subjective: This patient presents again today for scheduled visit for ongoing debridement of pre-ulcerative callus sub-left first MPJ and debridement of plantar callus right. Patient currently ambulates and diabetic shoes with pocket accommodations and for dispensing custom molded insoles with pocket accommodations 6 and diabetic shoes 2  Objective: Orientated 3   Vascular: DP pulses 1/4 bilaterally PT pulses 2/4 bilaterally Capillary reflex immediate bilaterally  Neurological: Sensation to 10 g monofilament wire intact 0/5 bilaterally Vibratory sensation nonreactive bilaterally Ankle reflexes reactive bilaterally  Dermatological: Bleeding callus sub-left first MPJ that remains closed after debridement 9 bleeding callus sub-first MPJ right  Musculoskeletal: HAV deformity left  Assessment: Diabetic peripheral neuropathy Pre-ulcerative plantar callus left HAV left Plantar callus right Satisfactory fit of diabetic shoes 2 and custom molded insoles 6  Plan: Debrided pre-ulcerative plantar callus and callus without any bleeding  Patient received 1 Pair Apex A830W Linda black in women's size 10.5 wide and 3 pairs custom molded diabetic inserts with pocket accommodations for bilateral 1st met heads.  Verbal and written break in and wear instructions given.  Patient will follow up for scheduled routine care.  Additional felt cut padding attached to the plantar left first MPJ to further deepen the pocket. A total of 3 felt pads were dispensed to patient today    Indications for shoes include: Diabetic polyneuropathy Hallux valgus left Type II polyneuropathy Loss of vibratory sensation Loss of sharp dull sensation Pre-ulcerative plantar callus left Diminished dorsalis pedis pulses bilaterally  Reappoint 2 months for debridement

## 2016-03-17 NOTE — Patient Instructions (Signed)

## 2016-03-18 ENCOUNTER — Other Ambulatory Visit (HOSPITAL_BASED_OUTPATIENT_CLINIC_OR_DEPARTMENT_OTHER): Payer: PPO

## 2016-03-18 ENCOUNTER — Telehealth: Payer: Self-pay | Admitting: Oncology

## 2016-03-18 ENCOUNTER — Ambulatory Visit (HOSPITAL_BASED_OUTPATIENT_CLINIC_OR_DEPARTMENT_OTHER): Payer: PPO | Admitting: Oncology

## 2016-03-18 VITALS — BP 157/62 | HR 56 | Temp 97.9°F | Resp 18 | Ht 68.0 in | Wt 202.7 lb

## 2016-03-18 DIAGNOSIS — R319 Hematuria, unspecified: Secondary | ICD-10-CM

## 2016-03-18 DIAGNOSIS — C9210 Chronic myeloid leukemia, BCR/ABL-positive, not having achieved remission: Secondary | ICD-10-CM

## 2016-03-18 DIAGNOSIS — E119 Type 2 diabetes mellitus without complications: Secondary | ICD-10-CM

## 2016-03-18 LAB — COMPREHENSIVE METABOLIC PANEL
ALBUMIN: 4 g/dL (ref 3.5–5.0)
ALK PHOS: 44 U/L (ref 40–150)
ALT: 31 U/L (ref 0–55)
AST: 21 U/L (ref 5–34)
Anion Gap: 8 mEq/L (ref 3–11)
BUN: 22 mg/dL (ref 7.0–26.0)
CALCIUM: 9.2 mg/dL (ref 8.4–10.4)
CHLORIDE: 106 meq/L (ref 98–109)
CO2: 24 mEq/L (ref 22–29)
Creatinine: 1.3 mg/dL — ABNORMAL HIGH (ref 0.6–1.1)
EGFR: 40 mL/min/{1.73_m2} — AB (ref >90)
Glucose: 215 mg/dl — ABNORMAL HIGH (ref 70–140)
POTASSIUM: 4.8 meq/L (ref 3.5–5.1)
Sodium: 139 mEq/L (ref 136–145)
Total Bilirubin: 1.02 mg/dL (ref 0.20–1.20)
Total Protein: 6.7 g/dL (ref 6.4–8.3)

## 2016-03-18 LAB — CBC WITH DIFFERENTIAL/PLATELET
BASO%: 1.4 % (ref 0.0–2.0)
BASOS ABS: 0.1 10*3/uL (ref 0.0–0.1)
EOS ABS: 0.2 10*3/uL (ref 0.0–0.5)
EOS%: 2.2 % (ref 0.0–7.0)
HCT: 41.9 % (ref 34.8–46.6)
HEMOGLOBIN: 13.6 g/dL (ref 11.6–15.9)
LYMPH%: 28.6 % (ref 14.0–49.7)
MCH: 28.3 pg (ref 25.1–34.0)
MCHC: 32.4 g/dL (ref 31.5–36.0)
MCV: 87.2 fL (ref 79.5–101.0)
MONO#: 0.7 10*3/uL (ref 0.1–0.9)
MONO%: 6.3 % (ref 0.0–14.0)
NEUT%: 61.5 % (ref 38.4–76.8)
NEUTROS ABS: 6.5 10*3/uL (ref 1.5–6.5)
PLATELETS: 310 10*3/uL (ref 145–400)
RBC: 4.8 10*6/uL (ref 3.70–5.45)
RDW: 13.7 % (ref 11.2–14.5)
WBC: 10.5 10*3/uL — AB (ref 3.9–10.3)
lymph#: 3 10*3/uL (ref 0.9–3.3)

## 2016-03-18 NOTE — Telephone Encounter (Signed)
Gave and printed appt sched and avs fo rpt for OCT °

## 2016-03-18 NOTE — Progress Notes (Signed)
Hematology and Oncology Follow Up Visit  Sara Warner [id number] [birth year] 75 y.o. 03/18/2016 10:12 AM   Principle Diagnosis: 75 year old woman with the leukocytosis and found to have chronic phase CML. Diagnosed a bone marrow biopsy on 10/24/2012.   Current treatment: Tasigna 300 mg bid started 11/2012 and she has complete hematological response.   Interim History: [name] presents today for a follow up visit by her self. Since her last visit, she reports no major complaints. She did develop an episode of hematuria and currently under evaluation by Dr. Gaynelle Arabian. She had a CT scan of the abdomen and pelvis on April 5 of 2017 and did not show clear-cut malignancy. There is question of nephrolithiasis. She is scheduled follow-up in the near future to discuss possible cystoscopy. She did have one episode of hematuria on Sunday but none since. She denied any abdominal pain, flank pain or constitutional symptoms.   She takes Tasigna without any new complications. She denied any side effects including edema, hypoglycemia or any thrombotic events. She denied any GI side effects. She continues to be very independent and attends to activities of daily living. Stable to drive and takes care of her own affairs.   She  does not report any fevers, does not report any chills, does not report any lymphadenopathy. She has not had any constitutional symptoms. She does not report any back pain or bony pain. She has not reported any recent hospitalizations. She does not report any chest pain but does report occasional palpitations. She does not report any dyspnea on exertion or leg edema. She does not report any cough or hemoptysis. He does not report any nausea, vomiting, abdominal pain. She does not report any hematochezia or melena. The remaining review of systems unremarkable.   Medications: I have reviewed the patient's current medications. Current Outpatient Prescriptions  Medication Sig Dispense  Refill  . aspirin EC 81 MG tablet Take 1 tablet (81 mg total) by mouth daily. 90 tablet 3  . clorazepate (TRANXENE-T) 7.5 MG tablet Take 1 tablet (7.5 mg total) by mouth 2 (two) times daily as needed for anxiety. 30 tablet 1  . digoxin (LANOXIN) 0.125 MG tablet Take 0.125 mg by mouth daily.    . furosemide (LASIX) 20 MG tablet Take 1 tablet (20 mg total) by mouth daily. 30 tablet 1  . glipiZIDE (GLUCOTROL XL) 10 MG 24 hr tablet Take 10 mg by mouth 2 (two) times daily.    . Levothyroxine Sodium (SYNTHROID PO) Take 125 mcg by mouth daily.     . metoprolol succinate (TOPROL-XL) 50 MG 24 hr tablet Take 1 tablet (50 mg total) by mouth 2 (two) times daily. 180 tablet 3  . Mouthwashes (MOUTHWASH/GARGLE) LIQD Swish for 30 second and spit out tid  2  . omeprazole (PRILOSEC) 40 MG capsule Take 40 mg by mouth daily.  2  . potassium chloride SA (K-DUR,KLOR-CON) 20 MEQ tablet Take 1 tablet (20 mEq total) by mouth daily. 30 tablet 1  . SYNTHROID 150 MCG tablet Take 150 mcg by mouth daily.  1  . TASIGNA 150 MG capsule TAKE 2 CAPSULES BY MOUTH EVERY 12 HOURS 112 capsule 0  . [DISCONTINUED] lisinopril (ZESTRIL) 2.5 MG tablet Take 1 tablet (2.5 mg total) by mouth daily. 90 tablet 4   No current facility-administered medications for this visit.    Allergies: No Known Allergies  Past Medical History, Surgical history, Social history, and Family History were reviewed and updated.   Physical Exam: Blood pressure 157/62,  pulse 56, temperature 97.9 F (36.6 C), temperature source Oral, resp. rate 18, height _0  (1.727 m), weight 202 lb 11.2 oz (91.944 kg), SpO2 99 %. ECOG: 1 General appearance: Well-appearing woman without distress. Head: Normocephalic, without obvious abnormality no oral thrush noted. Neck: no adenopathy Lymph nodes: Cervical, supraclavicular, and axillary nodes normal. Heart:irregularly irregular rhythm no murmurs. Lung:chest clear, no wheezing, rales, normal symmetric air  entry. Abdomen: soft, non-tender, without masses or organomegaly no rebound or guarding. EXT:no erythema, induration, or nodules Skin: No rashes or lesions.  Lab Results: Lab Results  Component Value Date   WBC 10.5* 03/18/2016   HGB 13.6 03/18/2016   HCT 41.9 03/18/2016   MCV 87.2 03/18/2016   PLT 310 03/18/2016     Chemistry      Component Value Date/Time   NA 139 09/18/2015 0955   NA 140 05/30/2015 0932   K 4.9 09/18/2015 0955   K 4.5 05/30/2015 0932   CL 104 05/30/2015 0932   CL 106 04/26/2013 1252   CO2 28 09/18/2015 0955   CO2 26 05/30/2015 0932   BUN 22.6 09/18/2015 0955   BUN 17 05/30/2015 0932   CREATININE 1.3* 09/18/2015 0955   CREATININE 0.80 05/30/2015 0932   CREATININE 0.82 05/24/2012 1004      Component Value Date/Time   CALCIUM 9.7 09/18/2015 0955   CALCIUM 9.1 05/30/2015 0932   ALKPHOS 55 09/18/2015 0955   ALKPHOS 53 05/30/2015 0932   AST 15 09/18/2015 0955   AST 19 05/30/2015 0932   ALT 22 09/18/2015 0955   ALT 20 05/30/2015 0932   BILITOT 0.86 09/18/2015 0955   BILITOT 0.7 05/30/2015 0932         Impression and Plan:   This is a pleasant 75 year old with:  1. CML.  Chronic phase at this time. On Tasigna since 11/2012 with normalization of her her WBC. She is tolerating this medication well. She has achieved a complete hematological response. Last molecular studies done in October 2015 did not show evidence of residual disease in the form of BCR-ABL  The plan is to continue the same dose and schedule without any modifications. Her molecular studies from today are currently pending.  2. Diabetes mellitus: She is currently on metformin without any complications. I continue to educate her about the potential hypoglycemia fact of Tasigna. Blood sugar have been reasonably controlled.  3. Cardiomyopathy: There is no evidence of exacerbation related to Tasigna.   4. Hematuria: She is following with Dr. Gaynelle Arabian from urology without any evidence  of malignancy. I urged her to follow-up regarding this issue and if she needs cystoscopy I encouraged her to do so to rule out any bladder tumors. CT scan of the abdomen and pelvis obtained in April 2017 was personally reviewed and discussed with the patient. It did not show any clear-cut malignancy at this time. She does have mild splenomegaly which is expected in the setting of CML.  5. Follow-up: In 6 months.      Suhayb Anzalone 4/19/201710:12 AM

## 2016-03-23 DIAGNOSIS — E119 Type 2 diabetes mellitus without complications: Secondary | ICD-10-CM | POA: Diagnosis not present

## 2016-03-25 ENCOUNTER — Other Ambulatory Visit: Payer: Self-pay | Admitting: Oncology

## 2016-03-25 MED FILL — *TASIGNA 150 MG CAPSULES: 150 | 28 days supply | Qty: 112 | Fill #0

## 2016-04-09 DIAGNOSIS — I4891 Unspecified atrial fibrillation: Secondary | ICD-10-CM | POA: Diagnosis not present

## 2016-04-09 DIAGNOSIS — E119 Type 2 diabetes mellitus without complications: Secondary | ICD-10-CM | POA: Diagnosis not present

## 2016-04-09 DIAGNOSIS — G629 Polyneuropathy, unspecified: Secondary | ICD-10-CM | POA: Diagnosis not present

## 2016-04-09 DIAGNOSIS — Z6831 Body mass index (BMI) 31.0-31.9, adult: Secondary | ICD-10-CM | POA: Diagnosis not present

## 2016-04-09 DIAGNOSIS — R310 Gross hematuria: Secondary | ICD-10-CM | POA: Diagnosis not present

## 2016-04-23 ENCOUNTER — Other Ambulatory Visit: Payer: Self-pay | Admitting: Oncology

## 2016-04-23 MED FILL — *TASIGNA 150 MG CAPSULES: 150 | 28 days supply | Qty: 112 | Fill #0

## 2016-04-28 DIAGNOSIS — N200 Calculus of kidney: Secondary | ICD-10-CM | POA: Diagnosis not present

## 2016-04-28 DIAGNOSIS — R310 Gross hematuria: Secondary | ICD-10-CM | POA: Diagnosis not present

## 2016-04-28 DIAGNOSIS — N261 Atrophy of kidney (terminal): Secondary | ICD-10-CM | POA: Diagnosis not present

## 2016-05-19 ENCOUNTER — Ambulatory Visit: Payer: PPO | Admitting: Podiatry

## 2016-05-20 ENCOUNTER — Ambulatory Visit (INDEPENDENT_AMBULATORY_CARE_PROVIDER_SITE_OTHER): Payer: PPO | Admitting: Nurse Practitioner

## 2016-05-20 ENCOUNTER — Encounter: Payer: Self-pay | Admitting: Nurse Practitioner

## 2016-05-20 VITALS — BP 149/74 | HR 72 | Ht 68.0 in | Wt 200.2 lb

## 2016-05-20 DIAGNOSIS — I428 Other cardiomyopathies: Secondary | ICD-10-CM

## 2016-05-20 DIAGNOSIS — I482 Chronic atrial fibrillation, unspecified: Secondary | ICD-10-CM | POA: Insufficient documentation

## 2016-05-20 DIAGNOSIS — I119 Hypertensive heart disease without heart failure: Secondary | ICD-10-CM | POA: Insufficient documentation

## 2016-05-20 DIAGNOSIS — I429 Cardiomyopathy, unspecified: Secondary | ICD-10-CM | POA: Diagnosis not present

## 2016-05-20 NOTE — Patient Instructions (Signed)
Your physician recommends that you continue on your current medications as directed. Please refer to the Current Medication list given to you today.  Ignacia Bayley, NP, recommends that you schedule a follow-up appointment in 6 months with Dr Claiborne Billings. You will receive a reminder letter in the mail two months in advance. If you don't receive a letter, please call our office to schedule the follow-up appointment.  If you need a refill on your cardiac medications before your next appointment, please call your pharmacy.

## 2016-05-20 NOTE — Progress Notes (Signed)
Office Visit    Patient Name: Sara Warner Date of Encounter: 05/20/2016  Primary Care Provider:  Leonides Sake, MD Primary Cardiologist:  Corky Downs, MD   Chief Complaint    75 year old female with a history of permanent atrial fibrillation and nonischemic cardiomyopathy, who presents for follow-up.   Past Medical History    Past Medical History  Diagnosis Date  . Chronic atrial fibrillation ([place])     a. Refuses [place].  CHA2DS2VASc = 4.  . Toxic goiter   . NICM (nonischemic cardiomyopathy) ([place])     a. 06/2013 MV: low risk study w/o ischemia;  b. 05/2015 Echo: EF 35-40%, diff HK, basal-midinferoseptal and basal-midanteroseptal AK. Mild-mod MR, mod dil LA, mild to mod TR, PASP 23mmHg.  Marland Kitchen Hypothyroidism   . Atrial fibrillation ([place])   . Chronic systolic CHF (congestive heart failure) ([place])     a. 05/2015 Echo: EF 35-40%.  . Elevated WBC count   . 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 Hypertensive heart disease    Past Surgical History  Procedure Laterality Date  . Cystectomy    . Hysterotomy    . Cardioversion      Allergies  No Known Allergies  History of Present Illness    75 year old female with a prior history of permanent atrial fibrillation for greater than 15 years. She had been anticoagulated with Coumadin in the past but this was discontinued secondary to recurrent epistaxis. She has since been on aspirin only and is not interested in initiating any other form of oral anticoagulation. She also has a history of a nonischemic cardiomyopathy with an EF of 35-40% by most recent echo in June 2016. She has had nonischemic Myoview's in the past, the last of which took place in 2014. She also has a history of CML and is followed by heme-onc, and more recently had hematuria and was seen by urology. It was felt that she may have had a small renal stone that resulted in hematuria.  From a cardiac standpoint, she has done well. She is active and is  asymptomatic from an atrial fibrillation standpoint. She denies chest pain, palpitations, dyspnea, PND, orthopnea, dizziness, syncope, or early satiety.  Home Medications    Prior to Admission medications   Medication Sig Start Date End Date Taking? Authorizing Provider  aspirin EC 81 MG tablet Take 1 tablet (81 mg total) by mouth daily. 07/10/15  Yes Troy Sine, MD  clorazepate (TRANXENE-T) 7.5 MG tablet Take 1 tablet (7.5 mg total) by mouth 2 (two) times daily as needed for anxiety. 01/10/13  Yes Maryanna Shape, NP  digoxin (LANOXIN) 0.125 MG tablet Take 0.125 mg by mouth daily.   Yes Historical Provider, MD  furosemide (LASIX) 20 MG tablet Take 1 tablet (20 mg total) by mouth daily. 07/19/14  Yes Troy Sine, MD  glipiZIDE (GLUCOTROL XL) 10 MG 24 hr tablet Take 10 mg by mouth 2 (two) times daily. 07/07/14  Yes Historical Provider, MD  Levothyroxine Sodium (SYNTHROID PO) Take 125 mcg by mouth daily.    Yes Historical Provider, MD  metoprolol succinate (TOPROL-XL) 50 MG 24 hr tablet Take 1 tablet (50 mg total) by mouth 2 (two) times daily. 01/07/16  Yes Troy Sine, MD  Mouthwashes (MOUTHWASH/GARGLE) LIQD Swish for 30 second and spit out tid 06/17/15  Yes Historical Provider, MD  omeprazole (PRILOSEC) 40 MG capsule Take 40 mg by mouth daily. 03/04/15  Yes Historical Provider, MD  potassium chloride SA (K-DUR,KLOR-CON)  20 MEQ tablet Take 1 tablet (20 mEq total) by mouth daily. 05/24/13  Yes Lelon Perla, MD  SYNTHROID 150 MCG tablet Take 150 mcg by mouth daily. 01/25/15  Yes Historical Provider, MD  TASIGNA 150 MG capsule TAKE 2 CAPSULES BY MOUTH EVERY 12 HOURS 04/23/16  Yes Wyatt Portela, MD    Review of Systems    She denies chest pain, palpitations, dyspnea, pnd, orthopnea, n, v, dizziness, syncope, edema, weight gain, or early satiety.  All other systems reviewed and are otherwise negative except as noted above.  Physical Exam    VS:  BP 149/74 mmHg  Pulse 72  Ht 5\' 8"  (1.727 m)   Wt 200 lb 3.2 oz (90.81 kg)  BMI 30.45 kg/m2 , BMI Body mass index is 30.45 kg/(m^2). GEN: Well nourished, well developed, in no acute distress. HEENT: normal. Neck: Supple, no JVD, carotid bruits, or masses. Cardiac: Irregularly irregular, no murmurs, rubs, or gallops. No clubbing, cyanosis, edema.  Radials/DP/PT 2+ and equal bilaterally.  Respiratory:  Respirations regular and unlabored, clear to auscultation bilaterally. GI: Soft, nontender, nondistended, BS + x 4. MS: no deformity or atrophy. Skin: warm and dry, no rash. Neuro:  Strength and sensation are intact. Psych: Normal affect.  Accessory Clinical Findings    ECG - Atrial fibrillation, 72, nonspecific ST changes. No acute changes.  Assessment & Plan    1.  Chronic atrial fibrillation: This is well rate controlled on beta blocker and digoxin therapy. She takes a baby aspirin daily. She is not interested in any other form of oral anticoagulation.  2. Nonischemic cardiomyopathy: Patient is euvolemic on exam today. She is on beta blocker and digoxin. She is not on an ACE inhibitor or ARB. Her blood pressure is elevated today, and we did discuss initiating additional therapy. She is not interested in adding in another medication at this time but has agreed to check her blood pressure at home daily and inform us if systolics are greater than 140 and regular basis. At that point, we should look to add lisinopril 5 mg daily.  We discussed the importance of daily weights, sodium restriction, medication compliance, and symptom reporting and she verbalizes understanding.   3. Hypertensive heart disease: As above, blood pressure is elevated at 149/74 today. It was 140/70 on repeat. She is not interested in adding medication today however, she did agree that she will check her blood pressure at home and if she is persistently elevated with systolics greater than [id number], she will contact the office within the week, and at which point, I would add  lisinopril 5 mg daily. If we end up doing this, she will need a basic metabolic panel a week later.   4. Type 2 diabetes mellitus: This is followed by primary care and she is on glipizide therapy.  5. Disposition:  She will contact us within the next week with blood pressure trends. Otherwise follow-up with Dr. Claiborne Billings in 6 months.   Murray Hodgkins, NP 05/20/2016, 10:32 AM

## 2016-05-26 ENCOUNTER — Other Ambulatory Visit: Payer: Self-pay | Admitting: Oncology

## 2016-05-26 MED FILL — *TASIGNA 150 MG CAPSULES: 150 | 28 days supply | Qty: 112 | Fill #0

## 2016-06-10 ENCOUNTER — Ambulatory Visit (INDEPENDENT_AMBULATORY_CARE_PROVIDER_SITE_OTHER): Payer: PPO | Admitting: Podiatry

## 2016-06-10 ENCOUNTER — Encounter: Payer: Self-pay | Admitting: Podiatry

## 2016-06-10 DIAGNOSIS — E1142 Type 2 diabetes mellitus with diabetic polyneuropathy: Secondary | ICD-10-CM

## 2016-06-10 DIAGNOSIS — L84 Corns and callosities: Secondary | ICD-10-CM

## 2016-06-10 NOTE — Patient Instructions (Signed)
Diabetes and Foot Care Diabetes may cause you to have problems because of poor blood supply (circulation) to your feet and legs. This may cause the skin on your feet to become thinner, break easier, and heal more slowly. Your skin may become dry, and the skin may peel and crack. You may also have nerve damage in your legs and feet causing decreased feeling in them. You may not notice minor injuries to your feet that could lead to infections or more serious problems. Taking care of your feet is one of the most important things you can do for yourself.  HOME CARE INSTRUCTIONS  Wear shoes at all times, even in the house. Do not go barefoot. Bare feet are easily injured.  Check your feet daily for blisters, cuts, and redness. If you cannot see the bottom of your feet, use a mirror or ask someone for help.  Wash your feet with warm water (do not use hot water) and mild soap. Then pat your feet and the areas between your toes until they are completely dry. Do not soak your feet as this can dry your skin.  Apply a moisturizing lotion or petroleum jelly (that does not contain alcohol and is unscented) to the skin on your feet and to dry, brittle toenails. Do not apply lotion between your toes.  Trim your toenails straight across. Do not dig under them or around the cuticle. File the edges of your nails with an emery board or nail file.  Do not cut corns or calluses or try to remove them with medicine.  Wear clean socks or stockings every day. Make sure they are not too tight. Do not wear knee-high stockings since they may decrease blood flow to your legs.  Wear shoes that fit properly and have enough cushioning. To break in new shoes, wear them for just a few hours a day. This prevents you from injuring your feet. Always look in your shoes before you put them on to be sure there are no objects inside.  Do not cross your legs. This may decrease the blood flow to your feet.  If you find a minor scrape,  cut, or break in the skin on your feet, keep it and the skin around it clean and dry. These areas may be cleansed with mild soap and water. Do not cleanse the area with peroxide, alcohol, or iodine.  When you remove an adhesive bandage, be sure not to damage the skin around it.  If you have a wound, look at it several times a day to make sure it is healing.  Do not use heating pads or hot water bottles. They may burn your skin. If you have lost feeling in your feet or legs, you may not know it is happening until it is too late.  Make sure your health care provider performs a complete foot exam at least annually or more often if you have foot problems. Report any cuts, sores, or bruises to your health care provider immediately. SEEK MEDICAL CARE IF:   You have an injury that is not healing.  You have cuts or breaks in the skin.  You have an ingrown nail.  You notice redness on your legs or feet.  You feel burning or tingling in your legs or feet.  You have pain or cramps in your legs and feet.  Your legs or feet are numb.  Your feet always feel cold. SEEK IMMEDIATE MEDICAL CARE IF:   There is increasing redness,   swelling, or pain in or around a wound.  There is a red line that goes up your leg.  Pus is coming from a wound.  You develop a fever or as directed by your health care provider.  You notice a bad smell coming from an ulcer or wound.   This information is not intended to replace advice given to you by your health care provider. Make sure you discuss any questions you have with your health care provider.   Document Released: 11/13/2000 Document Revised: 07/19/2013 Document Reviewed: 04/25/2013 [employer] Interactive Patient Education 2016 [employer]  

## 2016-06-11 NOTE — Progress Notes (Signed)
Patient ID: Sara Warner, female   DOB: [birth year], 75 y.o.   MRN: [id number]  Subjective: This patient presents again today for scheduled visit for ongoing debridement of pre-ulcerative callus sub-left first MPJ and debridement of plantar callus right. Patient currently ambulates and diabetic shoes with pocket accommodations and is wearing the replacement diabetic shoes without any difficulty dispensed on the visit of 03/17/2016  Objective: Orientated 3   Vascular: DP pulses 1/4 bilaterally PT pulses 2/4 bilaterally Capillary reflex immediate bilaterally  Neurological: Sensation to 10 g monofilament wire intact 0/5 bilaterally Vibratory sensation nonreactive bilaterally Ankle reflexes reactive bilaterally  Dermatological: No open skin lesions bilaterally Bleeding callus sub-left first MPJ that remains closed after debridement, large buildup of hyperkeratotic tissue callus sub-first MPJ right  Musculoskeletal: HAV deformity left  Assessment: Diabetic peripheral neuropathy Pre-ulcerative plantar callus left Plantar callus right HAV left  Plan: Debrided pre-ulcerative plantar callus and callus without any bleeding Wear diabetic shoes with accommodative diabetic insoles  Reappoint 6 weeks because of increased buildup of hyperkeratotic tissue sub-left first MPJ

## 2016-06-22 ENCOUNTER — Other Ambulatory Visit: Payer: Self-pay | Admitting: Oncology

## 2016-06-22 MED FILL — *TASIGNA 150 MG CAPSULES: 150 | 28 days supply | Qty: 112 | Fill #0

## 2016-07-17 DIAGNOSIS — K146 Glossodynia: Secondary | ICD-10-CM | POA: Diagnosis not present

## 2016-07-17 DIAGNOSIS — K144 Atrophy of tongue papillae: Secondary | ICD-10-CM | POA: Diagnosis not present

## 2016-07-17 DIAGNOSIS — K142 Median rhomboid glossitis: Secondary | ICD-10-CM | POA: Diagnosis not present

## 2016-07-20 ENCOUNTER — Other Ambulatory Visit: Payer: Self-pay | Admitting: Oncology

## 2016-07-20 MED FILL — *TASIGNA 150 MG CAPSULES: 150 | 28 days supply | Qty: 112 | Fill #0

## 2016-07-22 ENCOUNTER — Ambulatory Visit (INDEPENDENT_AMBULATORY_CARE_PROVIDER_SITE_OTHER): Payer: PPO | Admitting: Podiatry

## 2016-07-22 ENCOUNTER — Encounter: Payer: Self-pay | Admitting: Podiatry

## 2016-07-22 DIAGNOSIS — E1142 Type 2 diabetes mellitus with diabetic polyneuropathy: Secondary | ICD-10-CM | POA: Diagnosis not present

## 2016-07-22 DIAGNOSIS — L84 Corns and callosities: Secondary | ICD-10-CM | POA: Diagnosis not present

## 2016-07-22 NOTE — Progress Notes (Signed)
Patient ID: Sara Warner, female   DOB: 10/08/1941, 75 y.o.   MRN: [id number]    Subjective: This patient presents again today for scheduled visit for ongoing debridement of pre-ulcerative callus sub-left first MPJ and debridement of plantar callus right. Patient currently ambulates and diabetic shoes with pocket accommodations and is wearing the replacement diabetic shoes without any difficulty dispensed on the visit of 03/17/2016  Objective: Orientated 3   Vascular: DP pulses 1/4 bilaterally PT pulses 2/4 bilaterally Capillary reflex immediate bilaterally  Neurological: Sensation to 10 g monofilament wire intact 0/5 bilaterally Vibratory sensation nonreactive bilaterally Ankle reflexes reactive bilaterally  Dermatological: No open skin lesions bilaterally Bleeding callus sub-left first MPJ that remains closed after debridement, large buildup of hyperkeratotic tissue callus sub-first MPJ right  Musculoskeletal: HAV deformity left  Assessment: Diabetic peripheral neuropathy Pre-ulcerative plantar callus left Plantar callus right HAV left  Plan: Debrided pre-ulcerative plantar callus and callus without any bleeding Wear diabetic shoes with accommodative diabetic insoles  Reappoint 6 weeks because of increased buildup of hyperkeratotic tissue sub-left

## 2016-07-22 NOTE — Patient Instructions (Signed)
Where your diabetic shoes with custom insoles and additional padding on an ongoing continuous daily basis  Diabetes and Foot Care Diabetes may cause you to have problems because of poor blood supply (circulation) to your feet and legs. This may cause the skin on your feet to become thinner, break easier, and heal more slowly. Your skin may become dry, and the skin may peel and crack. You may also have nerve damage in your legs and feet causing decreased feeling in them. You may not notice minor injuries to your feet that could lead to infections or more serious problems. Taking care of your feet is one of the most important things you can do for yourself.  HOME CARE INSTRUCTIONS  Wear shoes at all times, even in the house. Do not go barefoot. Bare feet are easily injured.  Check your feet daily for blisters, cuts, and redness. If you cannot see the bottom of your feet, use a mirror or ask someone for help.  Wash your feet with warm water (do not use hot water) and mild soap. Then pat your feet and the areas between your toes until they are completely dry. Do not soak your feet as this can dry your skin.  Apply a moisturizing lotion or petroleum jelly (that does not contain alcohol and is unscented) to the skin on your feet and to dry, brittle toenails. Do not apply lotion between your toes.  Trim your toenails straight across. Do not dig under them or around the cuticle. File the edges of your nails with an emery board or nail file.  Do not cut corns or calluses or try to remove them with medicine.  Wear clean socks or stockings every day. Make sure they are not too tight. Do not wear knee-high stockings since they may decrease blood flow to your legs.  Wear shoes that fit properly and have enough cushioning. To break in new shoes, wear them for just a few hours a day. This prevents you from injuring your feet. Always look in your shoes before you put them on to be sure there are no objects  inside.  Do not cross your legs. This may decrease the blood flow to your feet.  If you find a minor scrape, cut, or break in the skin on your feet, keep it and the skin around it clean and dry. These areas may be cleansed with mild soap and water. Do not cleanse the area with peroxide, alcohol, or iodine.  When you remove an adhesive bandage, be sure not to damage the skin around it.  If you have a wound, look at it several times a day to make sure it is healing.  Do not use heating pads or hot water bottles. They may burn your skin. If you have lost feeling in your feet or legs, you may not know it is happening until it is too late.  Make sure your health care provider performs a complete foot exam at least annually or more often if you have foot problems. Report any cuts, sores, or bruises to your health care provider immediately. SEEK MEDICAL CARE IF:   You have an injury that is not healing.  You have cuts or breaks in the skin.  You have an ingrown nail.  You notice redness on your legs or feet.  You feel burning or tingling in your legs or feet.  You have pain or cramps in your legs and feet.  Your legs or feet are numb.  Your feet always feel cold. SEEK IMMEDIATE MEDICAL CARE IF:   There is increasing redness, swelling, or pain in or around a wound.  There is a red line that goes up your leg.  Pus is coming from a wound.  You develop a fever or as directed by your health care provider.  You notice a bad smell coming from an ulcer or wound.   This information is not intended to replace advice given to you by your health care provider. Make sure you discuss any questions you have with your health care provider.   Document Released: 11/13/2000 Document Revised: 07/19/2013 Document Reviewed: 04/25/2013 Elsevier Interactive Patient Education [employer].

## 2016-08-12 DIAGNOSIS — R609 Edema, unspecified: Secondary | ICD-10-CM | POA: Diagnosis not present

## 2016-08-12 DIAGNOSIS — E890 Postprocedural hypothyroidism: Secondary | ICD-10-CM | POA: Diagnosis not present

## 2016-08-12 DIAGNOSIS — Z9181 History of falling: Secondary | ICD-10-CM | POA: Diagnosis not present

## 2016-08-12 DIAGNOSIS — I10 Essential (primary) hypertension: Secondary | ICD-10-CM | POA: Diagnosis not present

## 2016-08-12 DIAGNOSIS — F419 Anxiety disorder, unspecified: Secondary | ICD-10-CM | POA: Diagnosis not present

## 2016-08-12 DIAGNOSIS — Z79899 Other long term (current) drug therapy: Secondary | ICD-10-CM | POA: Diagnosis not present

## 2016-08-12 DIAGNOSIS — E1165 Type 2 diabetes mellitus with hyperglycemia: Secondary | ICD-10-CM | POA: Diagnosis not present

## 2016-08-12 DIAGNOSIS — I4891 Unspecified atrial fibrillation: Secondary | ICD-10-CM | POA: Diagnosis not present

## 2016-08-17 ENCOUNTER — Other Ambulatory Visit: Payer: Self-pay | Admitting: Oncology

## 2016-08-17 MED FILL — *TASIGNA 150 MG CAPSULES: 150 | 28 days supply | Qty: 112 | Fill #0

## 2016-08-26 DIAGNOSIS — R7889 Finding of other specified substances, not normally found in blood: Secondary | ICD-10-CM | POA: Diagnosis not present

## 2016-09-02 ENCOUNTER — Ambulatory Visit (INDEPENDENT_AMBULATORY_CARE_PROVIDER_SITE_OTHER): Payer: PPO | Admitting: Podiatry

## 2016-09-02 ENCOUNTER — Encounter: Payer: Self-pay | Admitting: Podiatry

## 2016-09-02 VITALS — BP 155/78 | HR 83 | Resp 18

## 2016-09-02 DIAGNOSIS — L84 Corns and callosities: Secondary | ICD-10-CM

## 2016-09-02 DIAGNOSIS — E1142 Type 2 diabetes mellitus with diabetic polyneuropathy: Secondary | ICD-10-CM | POA: Diagnosis not present

## 2016-09-02 DIAGNOSIS — Q828 Other specified congenital malformations of skin: Secondary | ICD-10-CM | POA: Diagnosis not present

## 2016-09-02 NOTE — Patient Instructions (Signed)
Diabetes and Foot Care Diabetes may cause you to have problems because of poor blood supply (circulation) to your feet and legs. This may cause the skin on your feet to become thinner, break easier, and heal more slowly. Your skin may become dry, and the skin may peel and crack. You may also have nerve damage in your legs and feet causing decreased feeling in them. You may not notice minor injuries to your feet that could lead to infections or more serious problems. Taking care of your feet is one of the most important things you can do for yourself.  HOME CARE INSTRUCTIONS  Wear shoes at all times, even in the house. Do not go barefoot. Bare feet are easily injured.  Check your feet daily for blisters, cuts, and redness. If you cannot see the bottom of your feet, use a mirror or ask someone for help.  Wash your feet with warm water (do not use hot water) and mild soap. Then pat your feet and the areas between your toes until they are completely dry. Do not soak your feet as this can dry your skin.  Apply a moisturizing lotion or petroleum jelly (that does not contain alcohol and is unscented) to the skin on your feet and to dry, brittle toenails. Do not apply lotion between your toes.  Trim your toenails straight across. Do not dig under them or around the cuticle. File the edges of your nails with an emery board or nail file.  Do not cut corns or calluses or try to remove them with medicine.  Wear clean socks or stockings every day. Make sure they are not too tight. Do not wear knee-high stockings since they may decrease blood flow to your legs.  Wear shoes that fit properly and have enough cushioning. To break in new shoes, wear them for just a few hours a day. This prevents you from injuring your feet. Always look in your shoes before you put them on to be sure there are no objects inside.  Do not cross your legs. This may decrease the blood flow to your feet.  If you find a minor scrape,  cut, or break in the skin on your feet, keep it and the skin around it clean and dry. These areas may be cleansed with mild soap and water. Do not cleanse the area with peroxide, alcohol, or iodine.  When you remove an adhesive bandage, be sure not to damage the skin around it.  If you have a wound, look at it several times a day to make sure it is healing.  Do not use heating pads or hot water bottles. They may burn your skin. If you have lost feeling in your feet or legs, you may not know it is happening until it is too late.  Make sure your health care provider performs a complete foot exam at least annually or more often if you have foot problems. Report any cuts, sores, or bruises to your health care provider immediately. SEEK MEDICAL CARE IF:   You have an injury that is not healing.  You have cuts or breaks in the skin.  You have an ingrown nail.  You notice redness on your legs or feet.  You feel burning or tingling in your legs or feet.  You have pain or cramps in your legs and feet.  Your legs or feet are numb.  Your feet always feel cold. SEEK IMMEDIATE MEDICAL CARE IF:   There is increasing redness,   swelling, or pain in or around a wound.  There is a red line that goes up your leg.  Pus is coming from a wound.  You develop a fever or as directed by your health care provider.  You notice a bad smell coming from an ulcer or wound.   This information is not intended to replace advice given to you by your health care provider. Make sure you discuss any questions you have with your health care provider.   Document Released: 11/13/2000 Document Revised: 07/19/2013 Document Reviewed: 04/25/2013 [employer] Interactive Patient Education 2016 [employer]  

## 2016-09-03 NOTE — Progress Notes (Signed)
Patient ID: Sara Warner, female   DOB: 04/27/1941, 75 y.o.   MRN: [id number]    Subjective: This patient presents again today for scheduled visit for ongoing debridement of pre-ulcerative callus sub-left first MPJ and debridement of plantar callus right. Patient currently ambulates and diabetic shoes with pocket accommodations and is wearing the replacement diabetic shoes without any difficulty dispensed on the visit of 03/17/2016  Objective: Orientated 3   Vascular: DP pulses 1/4 bilaterally PT pulses 2/4 bilaterally Capillary reflex immediate bilaterally  Neurological: Sensation to 10 g monofilament wire intact 0/5 bilaterally Vibratory sensation nonreactive bilaterally Ankle reflexes reactive bilaterally  Dermatological: No open skin lesions bilaterally Bleeding callus sub-left first MPJ that remains closed after debridement, large buildup of hyperkeratotic tissue callus sub-first MPJ right  Musculoskeletal: HAV deformity left  Assessment: Diabetic peripheral neuropathy Pre-ulcerative plantar callus left Plantar callus right HAV left  Plan: Debrided pre-ulcerative plantar callus and callus without any bleeding Wear diabetic shoes with accommodative diabetic insoles Add additional felt cutout pad for the plantar left first MPJ to existing diabetic custom insole  Reappoint 6 weeks because of increased buildup of hyperkeratotic tissue sub-left

## 2016-09-14 ENCOUNTER — Other Ambulatory Visit: Payer: Self-pay | Admitting: Oncology

## 2016-09-14 MED FILL — *TASIGNA 150 MG CAPSULES: 150 | 28 days supply | Qty: 112 | Fill #0

## 2016-09-16 ENCOUNTER — Other Ambulatory Visit (HOSPITAL_BASED_OUTPATIENT_CLINIC_OR_DEPARTMENT_OTHER): Payer: PPO

## 2016-09-16 ENCOUNTER — Telehealth: Payer: Self-pay | Admitting: Oncology

## 2016-09-16 ENCOUNTER — Ambulatory Visit (HOSPITAL_BASED_OUTPATIENT_CLINIC_OR_DEPARTMENT_OTHER): Payer: PPO | Admitting: Oncology

## 2016-09-16 VITALS — BP 152/75 | HR 118 | Temp 99.0°F | Resp 18 | Ht 68.0 in | Wt 198.5 lb

## 2016-09-16 DIAGNOSIS — C9210 Chronic myeloid leukemia, BCR/ABL-positive, not having achieved remission: Secondary | ICD-10-CM

## 2016-09-16 DIAGNOSIS — C9211 Chronic myeloid leukemia, BCR/ABL-positive, in remission: Secondary | ICD-10-CM

## 2016-09-16 DIAGNOSIS — E119 Type 2 diabetes mellitus without complications: Secondary | ICD-10-CM

## 2016-09-16 LAB — COMPREHENSIVE METABOLIC PANEL
ALT: 22 U/L (ref 0–55)
ANION GAP: 9 meq/L (ref 3–11)
AST: 19 U/L (ref 5–34)
Albumin: 3.9 g/dL (ref 3.5–5.0)
Alkaline Phosphatase: 58 U/L (ref 40–150)
BUN: 25.2 mg/dL (ref 7.0–26.0)
CHLORIDE: 107 meq/L (ref 98–109)
CO2: 24 meq/L (ref 22–29)
Calcium: 9.3 mg/dL (ref 8.4–10.4)
Creatinine: 1.2 mg/dL — ABNORMAL HIGH (ref 0.6–1.1)
EGFR: 44 mL/min/{1.73_m2} — AB (ref >90)
Glucose: 146 mg/dl — ABNORMAL HIGH (ref 70–140)
POTASSIUM: 4.5 meq/L (ref 3.5–5.1)
SODIUM: 140 meq/L (ref 136–145)
Total Bilirubin: 1.09 mg/dL (ref 0.20–1.20)
Total Protein: 6.8 g/dL (ref 6.4–8.3)

## 2016-09-16 LAB — CBC WITH DIFFERENTIAL/PLATELET
BASO%: 0.9 % (ref 0.0–2.0)
BASOS ABS: 0.1 10*3/uL (ref 0.0–0.1)
EOS ABS: 0.2 10*3/uL (ref 0.0–0.5)
EOS%: 1.5 % (ref 0.0–7.0)
HCT: 39.6 % (ref 34.8–46.6)
HGB: 12.9 g/dL (ref 11.6–15.9)
LYMPH%: 28.7 % (ref 14.0–49.7)
MCH: 27.4 pg (ref 25.1–34.0)
MCHC: 32.6 g/dL (ref 31.5–36.0)
MCV: 84.1 fL (ref 79.5–101.0)
MONO#: 0.5 10*3/uL (ref 0.1–0.9)
MONO%: 4.8 % (ref 0.0–14.0)
NEUT#: 6.8 10*3/uL — ABNORMAL HIGH (ref 1.5–6.5)
NEUT%: 64.1 % (ref 38.4–76.8)
PLATELETS: 353 10*3/uL (ref 145–400)
RBC: 4.71 10*6/uL (ref 3.70–5.45)
RDW: 14.2 % (ref 11.2–14.5)
WBC: 10.6 10*3/uL — AB (ref 3.9–10.3)
lymph#: 3 10*3/uL (ref 0.9–3.3)
nRBC: 0 % (ref 0–0)

## 2016-09-16 NOTE — Telephone Encounter (Signed)
Avs report and appointment schedule given to patient per 09/16/16 los. °

## 2016-09-16 NOTE — Progress Notes (Signed)
Hematology and Oncology Follow Up Visit  Sara Warner [id number] [birth year] 75 y.o. 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 Diagnosed a bone marrow biopsy on 10/24/2012.   Current treatment: Tasigna 300 mg bid started 11/2012 and she has complete hematological response.   Interim History: Sara Warner presents today for a follow up visit by her self. Since her last visit, she continues to do very well without any recent changes to her health. She takes Tasigna without any new complications. She denied any side effects including edema, hypoglycemia or any thrombotic events. She denied any GI side effects. She does have multiple skin lesions that have been removed but not of them are malignant. She continues to live independently and attends to activities of daily living without any decline.  She  does not report any fevers, does not report any chills, does not report any lymphadenopathy. She has not had any constitutional symptoms. She does not report any back pain or bony pain. She has not reported any recent hospitalizations. She does not report any chest pain but does report occasional palpitations. She does not report any dyspnea on exertion or leg edema. She does not report any cough or hemoptysis. He does not report any nausea, vomiting, abdominal pain. She does not report any hematochezia or melena. The remaining review of systems unremarkable.   Medications: I have reviewed the patient's current medications. Current Outpatient Prescriptions  Medication Sig Dispense Refill  . aspirin EC 81 MG tablet Take 1 tablet (81 mg total) by mouth daily. 90 tablet 3  . clorazepate (TRANXENE-T) 7.5 MG tablet Take 1 tablet (7.5 mg total) by mouth 2 (two) times daily as needed for anxiety. 30 tablet 1  . digoxin (LANOXIN) 0.125 MG tablet Take 0.125 mg by mouth 3 (three) times a week. Takes on Monday,wednesday, and friday  only    . furosemide (LASIX) 20 MG tablet Take 1 tablet (20 mg total) by mouth daily. 30 tablet 1  . glipiZIDE (GLUCOTROL XL) 10 MG 24 hr tablet Take 10 mg by mouth 2 (two) times daily.    . Levothyroxine Sodium (SYNTHROID PO) Take 125 mcg by mouth daily.     . metoprolol succinate (TOPROL-XL) 50 MG 24 hr tablet     . Mouthwashes (MOUTHWASH/GARGLE) LIQD Swish for 30 second and spit out tid  2  . omeprazole (PRILOSEC) 40 MG capsule Take 40 mg by mouth daily.  2  . potassium chloride SA (K-DUR,KLOR-CON) 20 MEQ tablet Take 1 tablet (20 mEq total) by mouth daily. 30 tablet 1  . sitaGLIPtin (JANUVIA) 50 MG tablet     . SYNTHROID 150 MCG tablet Take 150 mcg by mouth daily.  1  . TASIGNA 150 MG capsule TAKE 2 CAPSULES BY MOUTH EVERY 12 HOURS 112 capsule 0   No current facility-administered medications for this visit.     Allergies: No Known Allergies  Past Medical History, Surgical history, Social history, and Family History were reviewed and updated.   Physical Exam: Blood pressure (!) 152/75, pulse (!) 118, temperature 99 F (37.2 C), temperature source Oral, resp. rate 18, height _0  (1.727 m), weight 198 lb 8 oz (90 kg), SpO2 96 %. ECOG: 1 General appearance: Well-appearing woman without distress. Head: Normocephalic, without obvious abnormality no oral ulcers or lesions. Neck: no adenopathy Lymph nodes: Cervical, supraclavicular, and axillary nodes normal. Heart:irregularly irregular rhythm no murmurs. Lung:chest clear, no wheezing, rales, normal symmetric air  entry. Abdomen: soft, non-tender, without masses or organomegaly no rebound or guarding. No splenomegaly  EXT:no erythema, induration, or nodules Skin: No rashes or lesions.  Lab Results: Lab Results  Component Value Date   WBC 10.6 (H) 09/16/2016   HGB 12.9 09/16/2016   HCT 39.6 09/16/2016   MCV 84.1 09/16/2016   PLT 353 09/16/2016     Chemistry      Component Value Date/Time   NA 139 03/18/2016 0950   K 4.8  03/18/2016 0950   CL 104 05/30/2015 0932   CL 106 04/26/2013 1252   CO2 24 03/18/2016 0950   BUN 22.0 03/18/2016 0950   CREATININE 1.3 (H) 03/18/2016 0950      Component Value Date/Time   CALCIUM 9.2 03/18/2016 0950   ALKPHOS 44 03/18/2016 0950   AST 21 03/18/2016 0950   ALT 31 03/18/2016 0950   BILITOT 1.02 03/18/2016 0950         Impression and Plan:   This is a pleasant 75 year old with:  1. CML.  Chronic phase at this time. On Tasigna since 11/2012 with normalization of her her WBC. She is tolerating this medication well. She has achieved a complete hematological response. Last molecular studies done in October 2015 did not show evidence of residual disease in the form of BCR-ABL  The plan is to continue the same dose and schedule without any modifications. Her molecular studies In 2015 showed complete molecular response. These were repeated today and currently pending.  2. Diabetes mellitus: She is currently on metformin without any complications. I continue to educate her about the potential hypoglycemia fact of Tasigna. Blood sugar have been reasonably controlled.  3. Cardiomyopathy: There is no evidence of exacerbation related to Tasigna.   4. Hematuria: Appears to have resolved at this time.  5. Follow-up: In 6 months.      SHADAD,FIRAS 10/18/201710:21 AM

## 2016-10-13 ENCOUNTER — Other Ambulatory Visit: Payer: Self-pay | Admitting: Oncology

## 2016-10-13 MED FILL — TASIGNA 150 MG CAPSULE: 150 | 28 days supply | Qty: 112 | Fill #0

## 2016-10-14 ENCOUNTER — Ambulatory Visit (INDEPENDENT_AMBULATORY_CARE_PROVIDER_SITE_OTHER): Payer: PPO | Admitting: Podiatry

## 2016-10-14 ENCOUNTER — Encounter: Payer: Self-pay | Admitting: Podiatry

## 2016-10-14 VITALS — BP 152/79 | HR 87 | Resp 18

## 2016-10-14 DIAGNOSIS — L84 Corns and callosities: Secondary | ICD-10-CM

## 2016-10-14 DIAGNOSIS — E1142 Type 2 diabetes mellitus with diabetic polyneuropathy: Secondary | ICD-10-CM

## 2016-10-14 DIAGNOSIS — Q828 Other specified congenital malformations of skin: Secondary | ICD-10-CM

## 2016-10-14 NOTE — Patient Instructions (Signed)

## 2016-10-14 NOTE — Progress Notes (Signed)
   Subjective:    Patient ID: Sara Warner, female    DOB: 1941/11/01, 75 y.o.   MRN: [id number]  HPI   I need my calluses trimmed up and my right big toenail is thick    Review of Systems  All other systems reviewed and are negative.      Objective:   Physical Exam        Assessment & Plan:

## 2016-10-14 NOTE — Progress Notes (Signed)
Patient ID: Sara Warner, female   DOB: 1941-08-02, 75 y.o.   MRN: [id number] Subjective: This patient presents again today for scheduled visit for ongoing debridement of pre-ulcerative callus sub-left first MPJ and debridement of plantar callus right. Patient currently ambulates and diabetic shoes with pocket accommodations and is wearing the replacement diabetic shoes without any difficulty dispensed on the visit of 03/17/2016  Objective: Orientated 3   Vascular: DP pulses 1/4 bilaterally PT pulses 2/4 bilaterally Capillary reflex immediate bilaterally  Neurological: Sensation to 10 g monofilament wire intact 0/5 bilaterally Vibratory sensation nonreactive bilaterally Ankle reflexes reactive bilaterally  Dermatological: No open skin lesions bilaterally Bleeding callus sub-left first MPJ that remains closed after debridement, large buildup of hyperkeratotic tissue callus sub-first MPJ right Some occasional hypertrophic toenails right and left  Musculoskeletal: HAV deformity left  Assessment: Diabetic peripheral neuropathy Pre-ulcerative plantar callus left Plantar callus right HAV left  Plan: Debrided pre-ulcerative plantar callus and callus without any bleeding Wear diabetic shoes with accommodative diabetic insoles Add additional felt cutout pad for the plantar left first MPJ to existing diabetic custom insole  Reappoint 6 weeks

## 2016-10-20 DIAGNOSIS — E669 Obesity, unspecified: Secondary | ICD-10-CM | POA: Diagnosis not present

## 2016-10-20 DIAGNOSIS — Z6832 Body mass index (BMI) 32.0-32.9, adult: Secondary | ICD-10-CM | POA: Diagnosis not present

## 2016-10-20 DIAGNOSIS — M546 Pain in thoracic spine: Secondary | ICD-10-CM | POA: Diagnosis not present

## 2016-11-04 DIAGNOSIS — J0100 Acute maxillary sinusitis, unspecified: Secondary | ICD-10-CM | POA: Diagnosis not present

## 2016-11-09 ENCOUNTER — Other Ambulatory Visit: Payer: Self-pay | Admitting: Oncology

## 2016-11-10 MED FILL — TASIGNA 150 MG CAPSULE: 150 | 28 days supply | Qty: 112 | Fill #0

## 2016-11-16 ENCOUNTER — Ambulatory Visit (INDEPENDENT_AMBULATORY_CARE_PROVIDER_SITE_OTHER): Payer: PPO | Admitting: Cardiovascular Disease

## 2016-11-16 ENCOUNTER — Encounter: Payer: Self-pay | Admitting: Cardiovascular Disease

## 2016-11-16 VITALS — BP 138/68 | HR 90 | Ht 68.0 in | Wt 202.0 lb

## 2016-11-16 DIAGNOSIS — E039 Hypothyroidism, unspecified: Secondary | ICD-10-CM

## 2016-11-16 DIAGNOSIS — I482 Chronic atrial fibrillation, unspecified: Secondary | ICD-10-CM

## 2016-11-16 DIAGNOSIS — N183 Chronic kidney disease, stage 3 unspecified: Secondary | ICD-10-CM

## 2016-11-16 DIAGNOSIS — C9210 Chronic myeloid leukemia, BCR/ABL-positive, not having achieved remission: Secondary | ICD-10-CM

## 2016-11-16 DIAGNOSIS — E119 Type 2 diabetes mellitus without complications: Secondary | ICD-10-CM

## 2016-11-16 MED ORDER — METOPROLOL SUCCINATE ER 50 MG PO TB24: ORAL_TABLET | ORAL | 6 refills | Status: DC

## 2016-11-16 NOTE — Patient Instructions (Signed)
Your physician has recommended you make the following change in your medication:   1.)  The metoprolol has been increased to 125 mcg daily. ( 2.5 tablets)  Your physician wants you to follow-up in: 6 months or sooner if needed. You will receive a reminder letter in the mail two months in advance. If you don't receive a letter, please call our office to schedule the follow-up appointment.  If you need a refill on your cardiac medications before your next appointment, please call your pharmacy.

## 2016-11-17 NOTE — Progress Notes (Signed)
Patient ID: Sara Warner, female   DOB: 07-26-1941, 75 y.o.   MRN: [id number]     HPI: Sara Warner is a 75 year old female who presents to the office today for a 9 month followup cardiology evaluation.   Sara Warner is a former patient of Dr. Doreatha Lew. She has a history of permanent atrial fibrillation and admits to being in atrial fibrillation for >18 years. She had problems with recurrent epistaxis in the past on Coumadin and consequently has not been on anticoagulation therapy. An echo Doppler study in 2008 showed an ejection fraction of 30-35%. A Myoview in February 2009 showed no ischemia but mild global reduction in LV function with an ejection fraction of 47%. An echo in 2010  showed low normal LV function with mild LA enlargement and mild mitral regurgitation and tricuspid regurgitation.  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 She does also have issues with thyroid abnormality. She has varicose veins and notes right lower extremity swelling.   When  I initially saw her in 2014, we discussed potential thromboembolic risk associated with atrial fibrillation. She has not been on any coagulation due to frequent nosebleeds requiring cauterization and does have CML. She did have a significant remote tobacco history of 2 packs per day for over 20 years. She had nonspecific ST changes inferolaterally on her EKG. A nuclear perfusion scan  on 07/05/2013 was not gated to her atrial fibrillation and was a low risk study demonstrating mild artifact. There was no scar or ischemia. A 2-D echo Doppler study showed normal systolic function and Doppler parameters suggested increased mean left atrial pressure. She is systolic bowing of her mitral valve leaflet without definitive prolapse with mild MR. Her left atrium was moderate to severely dilated and her right atrium was moderately dilated. There was mild pulmonary hypertension with PA pressure estimated at 34 mm.   A  follow-up echo Doppler study on  05/30/2015 revealed an ejection fraction of 35-40%.  There was diffuse hypokinesis with suggestion of akinesis involving the basal to mid inferoseptal wall.  There was aortic valve sclerosis without stenosis, and mild-to-moderate mitral and tricuspid regurgitation.  Her left atrium was moderately dilated.  There was mild pulmonary hypertension with estimated PA pressure 37 mm.  She has been on rate control for atrial fibrillation and most recently is on digoxin 0.125 mg daily and Toprol-XL 50 mg twice a day.  Remotely, she had developed significant lower extremity edema on verapamil. She is diabetic on Glucotrol XL.  She has a history of hypothyroidism on Synthroid 125 g.  GERD on Prilosec.  There also is a remote history of microcytic indices.  She is on the Tasign[employer] for her CML.  Since I last saw her, she states that she has undergone laboratory by Sara Warner, PAC in [place].  She recently had blood in her urine and was found to have a kidney stone.  She has been taken to jocks and on just Monday, Wednesday and Fridays.  She continues to take Synthroid 150 g daily for her hypothyroidism.  She has been taking metoprolol, succinate 100 mg in the morning for rate control.  At times there has been some mild swelling for which he takes furosemide 20 mg daily.  She is diabetic on glipizide and metformin.   She denies chest pain. She is unaware of palpitations. She denies presyncope or syncope. She presents for reevaluation.   Past Medical History:  Diagnosis Date  . Atrial fibrillation ([place])   . Chronic atrial  fibrillation ([place])    a. Refuses [place].  CHA2DS2VASc = 4.  . Chronic systolic CHF (congestive heart failure) ([place])    a. 05/2015 Echo: EF 35-40%.  . CML (chronic myelocytic leukemia) ([place]) 01/10/2013  . Elevated WBC count   . Hypertensive heart disease   . Hypothyroidism   . NICM (nonischemic cardiomyopathy) ([place])    a. 06/2013 MV: low risk study w/o ischemia;  b. 05/2015 Echo: EF 35-40%, diff HK,  basal-midinferoseptal and basal-midanteroseptal AK. Mild-mod MR, mod dil LA, mild to mod TR, PASP 40mHg.  .Marland KitchenToxic goiter     Past Surgical History:  Procedure Laterality Date  . CARDIOVERSION    . CYSTECTOMY    . HYSTEROTOMY      No Known Allergies  Current Outpatient Prescriptions  Medication Sig Dispense Refill  . aspirin EC 81 MG tablet Take 1 tablet (81 mg total) by mouth daily. 90 tablet 3  . clorazepate (TRANXENE-T) 7.5 MG tablet Take 1 tablet (7.5 mg total) by mouth 2 (two) times daily as needed for anxiety. 30 tablet 1  . digoxin (LANOXIN) 0.125 MG tablet Take 0.125 mg by mouth 3 (three) times a week. Takes on Monday,wednesday, and friday only    . furosemide (LASIX) 20 MG tablet Take 1 tablet (20 mg total) by mouth daily. 30 tablet 1  . glipiZIDE (GLUCOTROL XL) 10 MG 24 hr tablet Take 10 mg by mouth 2 (two) times daily.    . metFORMIN (GLUCOPHAGE-XR) 500 MG 24 hr tablet Take 1 tablet by mouth 2 (two) times daily.    . metoprolol succinate (TOPROL-XL) 50 MG 24 hr tablet Take 2.5 tablets in the morning. 75 tablet 6  . Mouthwashes (MOUTHWASH/GARGLE) LIQD Swish for 30 second and spit out tid  2  . potassium chloride SA (K-DUR,KLOR-CON) 20 MEQ tablet Take 1 tablet (20 mEq total) by mouth daily. 30 tablet 1  . sitaGLIPtin (JANUVIA) 50 MG tablet     . SYNTHROID 150 MCG tablet Take 150 mcg by mouth daily.  1  . TASIGNA 150 MG capsule TAKE 2 CAPSULES BY MOUTH EVERY 12 HOURS 112 capsule 0   No current facility-administered medications for this visit.     Socially she is widowed for 23 years. She has 5 children 15 grandchildren and 17 great grandchildren. She lives at home with her 2 grandchildren. She is retired. She completed ninth grade of education. She is a former smoker but quit smoking in 1987 there prior to that she had smoked up to 2 packs per day for 20 years. There is no alcohol use.  Family History  Problem Relation Age of Onset  . Cancer - Colon Mother     mast to  lungs  . Diabetes Father   . Diabetes Paternal Grandmother     ROS General: Negative; No fevers, chills, or night sweats;  HEENT: Negative; No changes in vision or hearing, sinus congestion, difficulty swallowing Pulmonary: Negative; No cough, wheezing, shortness of breath, hemoptysis Cardiovascular: Negative; No chest pain, presyncope, syncope, palpitations Occasional leg swelling GI: Negative; No nausea, vomiting, diarrhea, or abdominal pain GU: Negative; No dysuria, hematuria, or difficulty voiding Musculoskeletal: Negative; no myalgias, joint pain, or weakness Hematologic/Oncology: Positive for CML Endocrine: Positive for hypothyroidism and diabetes Neuro: Negative; no changes in balance, headaches Skin: Negative; No rashes or skin lesions Psychiatric: Negative; No behavioral problems, depression Sleep: Negative; No snoring, daytime sleepiness, hypersomnolence, bruxism, restless legs, hypnogognic hallucinations, no cataplexy Other comprehensive 14 point system review is negative.  PE BP 138/68 (BP Location: Left Arm, Patient Position: Sitting, Cuff Size: Normal)   Pulse 90   Ht 5' 8"  (1.727 m)   Wt 202 lb (91.6 kg)   BMI 30.71 kg/m    Repeat blood pressure by me 132/72  Wt Readings from Last 3 Encounters:  11/16/16 202 lb (91.6 kg)  09/16/16 198 lb 8 oz (90 kg)  05/20/16 200 lb 3.2 oz (90.8 kg)   General: Alert, oriented, no distress.  Skin: normal turgor, no rashes HEENT: Normocephalic, atraumatic. Pupils round and reactive; sclera anicteric; Fundi without hemorrhages or exudates. Nose without nasal septal hypertrophy Mouth/Parynx benign; Mallinpatti scale 3 Neck: No JVD, no carotid bruits with normal carotid upstroke Chest wall: Nontender to palpation Lungs: clear to ausculatation and percussion; no wheezing or rales Heart:  atrial fibrillation with a ventricular rate in the 73S;   1/6 systolic murmur.  No diastolic murmur Abdomen: soft, nontender; no  hepatosplenomehaly, BS+; abdominal aorta nontender and not dilated by palpation. Back: No CVA tenderness Pulses 2+ Extremities: Mild varicosities, trace right lower extremity edema, no clubbing cyanosis, Homan's sign negative  Neurologic: grossly nonfocal Psychological: Normal affect and mood  ECG (independently read by me): Atrial fibrillation with ventricular rate at 90 bpm.  LVH by voltage.  Nonspecific ST changes.  February 2017 ECG (independently read by me):  Atrial fibrillation with ventricular rate at 78 bpm.  Nonspecific ST changes, probably secondary to digoxin effect. Normal QTc interval.  November 2016 ECG (independently read by me): Atrial fibrillation with a ventricular rate in the 90s.  LVH by voltage.  Inferolateral ST-T changes possibly contributed by digoxin.  August 2016 ECG (independently read by me): Atrial fibrillation with rate in the 80s.  Inferolateral ST-T changes.  QTc interval 420 ms.  June 2016 ECG (independently read by me): Atrial fibrillation with rapid ventricular response with a rate at 1 21 bpm.  Nonspecific inferolateral ST segment changes.  LABS:  BMP Latest Ref Rng & Units 09/16/2016 03/18/2016 09/18/2015  Glucose 70 - 140 mg/dl 146(H) 215(H) 207(H)  BUN 7.0 - 26.0 mg/dL 25.2 22.0 22.6  Creatinine 0.6 - 1.1 mg/dL 1.2(H) 1.3(H) 1.3(H)  Sodium 136 - 145 mEq/L 140 139 139  Potassium 3.5 - 5.1 mEq/L 4.5 4.8 4.9  Chloride 96 - 112 mEq/L - - -  CO2 22 - 29 mEq/L 24 24 28   Calcium 8.4 - 10.4 mg/dL 9.3 9.2 9.7   Hepatic Function Latest Ref Rng & Units 09/16/2016 03/18/2016 09/18/2015  Total Protein 6.4 - 8.3 g/dL 6.8 6.7 7.0  Albumin 3.5 - 5.0 g/dL 3.9 4.0 4.2  AST 5 - 34 U/L 19 21 15   ALT 0 - 55 U/L 22 31 22   Alk Phosphatase 40 - 150 U/L 58 44 55  Total Bilirubin 0.20 - 1.20 mg/dL 1.09 1.02 0.86   CBC Latest Ref Rng & Units 09/16/2016 03/18/2016 09/18/2015  WBC 3.9 - 10.3 10e3/uL 10.6(H) 10.5(H) 12.1(H)  Hemoglobin 11.6 - 15.9 g/dL 12.9 13.6 14.8    Hematocrit 34.8 - 46.6 % 39.6 41.9 45.9  Platelets 145 - 400 10e3/uL 353 310 329   Lab Results  Component Value Date   MCV 84.1 09/16/2016   MCV 87.2 03/18/2016   MCV 83.0 09/18/2015   Lab Results  Component Value Date   TSH 1.932 05/30/2015  No results found for: HGBA1C   Lipid Panel     Component Value Date/Time   CHOL 128 05/30/2015 0932   TRIG 110 05/30/2015 0932  HDL 27 (L) 05/30/2015 0932   CHOLHDL 4.7 05/30/2015 0932   VLDL 22 05/30/2015 0932   LDLCALC 79 05/30/2015 0932    RADIOLOGY: No results found.   IMPRESSION:  1. Chronic atrial fibrillation ([place])   2. CML (chronic myelocytic leukemia) ([facility])   3. Stage III chronic kidney disease   4. Hypothyroidism, unspecified type   5. Controlled type 2 diabetes mellitus without complication, without long-term current use of insulin ([facility])      ASSESSMENT AND PLAN: Ms. Haslip is a 75 year old female who has a history of permanent atrial fibrillation for over 18 years duration. She has not been on anticoagulation due to history of frequent nosebleeds in the past requiring cauterization.   She is aware of potential for CVA risk with atrial fibrillation compounding over the years.  She still prefers against initiating anticoagulation, whether with warfarin or with the newer agents.  She is on aspirin.  He recently was found to have blood in her urine and was diagnosed as having a renal stone.  She also has been diagnosed with CML and is undergoing treatment. When I last saw her previosly her resting pulse was increased and I further titrated her beta blocker therapy.  Her dose of digoxin was reduced by Sara Warner, such that she now takes 0.125 mg only 3 days a week.  As result, a resting heart rate is now in the 90s.  I am further titrating metoprolol to 125 mg every morning, which is a succinate preparation.  She denies any cold or heat intolerance and continues to be on levothyroxine for her hypothyroidism.  Her GERD is  controlled.  She will be having laboratory by her primary physician.  I will last that these be sent to me for my review.  Time spent: 25 minutes  Troy Sine, MD, [facility] 11/17/2016 8:36 PM

## 2016-11-25 ENCOUNTER — Ambulatory Visit (INDEPENDENT_AMBULATORY_CARE_PROVIDER_SITE_OTHER): Payer: PPO | Admitting: Podiatry

## 2016-11-25 NOTE — Progress Notes (Signed)
Erroneous encounter no show  

## 2016-12-10 ENCOUNTER — Other Ambulatory Visit: Payer: Self-pay | Admitting: Oncology

## 2016-12-10 MED FILL — TASIGNA 150 MG CAPSULE: 150 | 28 days supply | Qty: 112 | Fill #0

## 2016-12-21 NOTE — Addendum Note (Signed)
Addended by: Waylan Rocher on: 12/21/2016 08:49 AM   Modules accepted: Orders

## 2016-12-23 DIAGNOSIS — Z79899 Other long term (current) drug therapy: Secondary | ICD-10-CM | POA: Diagnosis not present

## 2016-12-23 DIAGNOSIS — E781 Pure hyperglyceridemia: Secondary | ICD-10-CM | POA: Diagnosis not present

## 2016-12-23 DIAGNOSIS — E119 Type 2 diabetes mellitus without complications: Secondary | ICD-10-CM | POA: Diagnosis not present

## 2016-12-23 DIAGNOSIS — I4891 Unspecified atrial fibrillation: Secondary | ICD-10-CM | POA: Diagnosis not present

## 2016-12-23 DIAGNOSIS — E890 Postprocedural hypothyroidism: Secondary | ICD-10-CM | POA: Diagnosis not present

## 2016-12-23 DIAGNOSIS — I10 Essential (primary) hypertension: Secondary | ICD-10-CM | POA: Diagnosis not present

## 2016-12-23 DIAGNOSIS — F419 Anxiety disorder, unspecified: Secondary | ICD-10-CM | POA: Diagnosis not present

## 2016-12-23 DIAGNOSIS — Z7689 Persons encountering health services in other specified circumstances: Secondary | ICD-10-CM | POA: Diagnosis not present

## 2017-01-04 ENCOUNTER — Other Ambulatory Visit: Payer: Self-pay | Admitting: Cardiovascular Disease

## 2017-01-07 ENCOUNTER — Other Ambulatory Visit: Payer: Self-pay | Admitting: Oncology

## 2017-01-07 MED FILL — TASIGNA 150 MG CAPSULE: 150 | 28 days supply | Qty: 112 | Fill #0

## 2017-01-20 ENCOUNTER — Ambulatory Visit (INDEPENDENT_AMBULATORY_CARE_PROVIDER_SITE_OTHER): Payer: PPO | Admitting: Physician Assistant

## 2017-01-20 ENCOUNTER — Telehealth: Payer: Self-pay | Admitting: Internal Medicine

## 2017-01-20 ENCOUNTER — Encounter: Payer: Self-pay | Admitting: Physician Assistant

## 2017-01-20 VITALS — BP 124/82 | HR 103 | Ht 68.0 in | Wt 204.0 lb

## 2017-01-20 DIAGNOSIS — I5023 Acute on chronic systolic (congestive) heart failure: Secondary | ICD-10-CM | POA: Diagnosis not present

## 2017-01-20 DIAGNOSIS — I119 Hypertensive heart disease without heart failure: Secondary | ICD-10-CM

## 2017-01-20 DIAGNOSIS — I482 Chronic atrial fibrillation, unspecified: Secondary | ICD-10-CM

## 2017-01-20 DIAGNOSIS — I428 Other cardiomyopathies: Secondary | ICD-10-CM

## 2017-01-20 MED ORDER — METOPROLOL SUCCINATE ER 50 MG PO TB24: 150.0000 mg | ORAL_TABLET | Freq: Every day | ORAL | 11 refills | Status: DC

## 2017-01-20 NOTE — Telephone Encounter (Signed)
Pt c/o Shortness Of Breath: STAT if SOB developed within the last 24 hours or pt is noticeably SOB on the phone  1. Are you currently SOB (can you hear that pt is SOB on the phone)? no 2. How long have you been experiencing SOB? month 3. Are you SOB when sitting or when up moving around? both  4. Are you currently experiencing any other symptoms? Leg weakness   Pt agreed to same day appt with vin @2 :30pm okay per Vin

## 2017-01-20 NOTE — Progress Notes (Signed)
Cardiology Office Note    Date:  01/20/2017   ID:  Sara Warner, DOB 01/03/1941, MRN [id number]  PCP:  Leonides Sake, MD  Cardiologist: Dr. Claiborne Billings  Chief Complaint: SOB  History of Present Illness:   Sara Warner is a 76 y.o. female chronic afib, chronic systolic CHF, CML, NICM, CKD stage III, DM and hypertensive heart disease added to my schedule for SOB.   Sara Warner is a former patient of Dr. Doreatha Lew. She has a history of permanent atrial fibrillation and admits to being in atrial fibrillation for >18 years. She had problems with recurrent epistaxis in the past on Coumadin and consequently has not been on anticoagulation therapy.  Normal EF on echo 06/2017. Normal Myoview 06/2013. Marland Kitchen Last echo 05/2015 showed LV EF of 35-40. There was diffuse hypokinesis with suggestion of akinesis involving the basal to mid inferoseptal wall.  There was aortic valve sclerosis without stenosis, and mild-to-moderate mitral and tricuspid regurgitation.  Her left atrium was moderately dilated.  There was mild pulmonary hypertension with estimated PA pressure 37 mm.  Last seen by Dr. Claiborne Billings 11/16/16. Baseline rate in 90s. Increased metoprolol to 125 mg every morning, which is a succinate preparation.   Added to my schedule for SOB. This been ongoing for the past one month. However, recently worsened in one week. Some recliner chronically. Also noted intermittent palpitation.. Rate of 90s at home at baseline. Hasn't checked while rate is up. No chest pain, dizziness or syncope. She does complains of abdominal tightness and early satiety. On lower dose of digoxin (M and F) --> reduced by PCP due to abnormal level   Past Medical History:  Diagnosis Date  . Atrial fibrillation ([place])   . Chronic atrial fibrillation ([place])    a. Refuses [place].  CHA2DS2VASc = 4.  . Chronic systolic CHF (congestive heart failure) ([place])    a. 05/2015 Echo: EF 35-40%.  . CKD (chronic kidney disease), stage III   . CML  (chronic myelocytic leukemia) ([place]) 01/10/2013  . DM (diabetes mellitus) ([place])   . Elevated WBC count   . Hypertensive heart disease   . Hypothyroidism   . NICM (nonischemic cardiomyopathy) ([place])    a. 06/2013 MV: low risk study w/o ischemia;  b. 05/2015 Echo: EF 35-40%, diff HK, basal-midinferoseptal and basal-midanteroseptal AK. Mild-mod MR, mod dil LA, mild to mod TR, PASP 55mmHg.  Marland Kitchen Toxic goiter     Past Surgical History:  Procedure Laterality Date  . CARDIOVERSION    . CYSTECTOMY    . HYSTEROTOMY      Current Medications:  Prior to Admission medications   Medication Sig Start Date End Date Taking? Authorizing Provider  aspirin EC 81 MG tablet Take 1 tablet (81 mg total) by mouth daily. 07/10/15  Yes Troy Sine, MD  clorazepate (TRANXENE-T) 7.5 MG tablet Take 1 tablet (7.5 mg total) by mouth 2 (two) times daily as needed for anxiety. 01/10/13  Yes Maryanna Shape, NP  digoxin (LANOXIN) 0.125 MG tablet Take 0.125 mg by mouth 2 (two) times a week. Takes on Monday, and friday only   Yes Historical Provider, MD  fexofenadine (ALLEGRA) 180 MG tablet Take 180 mg by mouth daily.   Yes Historical Provider, MD  furosemide (LASIX) 20 MG tablet Take 1 tablet (20 mg total) by mouth daily. Patient taking differently: Take 20 mg by mouth 2 (two) times daily. Take one to two tablets by mouth daily 07/19/14  Yes Troy Sine, MD  glipiZIDE (GLUCOTROL  XL) 10 MG 24 hr tablet Take 10 mg by mouth 2 (two) times daily. 07/07/14  Yes Historical Provider, MD  metFORMIN (GLUCOPHAGE-XR) 500 MG 24 hr tablet Take 1 tablet by mouth 2 (two) times daily. 11/07/16  Yes Historical Provider, MD  potassium chloride SA (K-DUR,KLOR-CON) 20 MEQ tablet Take 1 tablet (20 mEq total) by mouth daily. 05/24/13  Yes Lelon Perla, MD  sitaGLIPtin (JANUVIA) 50 MG tablet Take 50 mg by mouth.  07/13/16  Yes Historical Provider, MD  SYNTHROID 150 MCG tablet Take 150 mcg by mouth daily. 01/25/15  Yes Historical Provider, MD    TASIGNA 150 MG capsule TAKE 2 CAPSULES BY MOUTH EVERY 12 HOURS 01/07/17  Yes Wyatt Portela, MD  metoprolol succinate (TOPROL-XL) 50 MG 24 hr tablet Take 3 tablets (150 mg total) by mouth daily. Take with or immediately following a meal. 01/20/17 04/20/17  Leanor Kail, PA  Mouthwashes (MOUTHWASH/GARGLE) LIQD Swish for 30 second and spit out tid 06/17/15   Historical Provider, MD   Allergies:   Patient has no known allergies.   Social History   Social History  . Marital status: Single    Spouse name: N/A  . Number of children: N/A  . Years of education: N/A   Social History Main Topics  . Smoking status: Former Smoker    Types: Cigarettes    Quit date: 01/01/1984  . Smokeless tobacco: Never Used  . Alcohol use No  . Drug use: Unknown  . Sexual activity: Not Asked   Other Topics Concern  . None   Social History Narrative  . None     Family History:  The patient's family history includes Cancer - Colon in her mother; Diabetes in her father and paternal grandmother.   ROS:   Please see the history of present illness.    ROS All other systems reviewed and are negative.   PHYSICAL EXAM:   VS:  BP 124/82   Pulse (!) 103   Ht 5\' 8"  (1.727 m)   Wt 204 lb (92.5 kg)   BMI 31.02 kg/m    GEN: Well nourished, well developed, in no acute distress  HEENT: normal  Neck: no JVD, carotid bruits, or masses Cardiac: Ir Ir ; no murmurs, rubs, or gallops, trace to 1 + edema  Respiratory:  clear to auscultation bilaterally, normal work of breathing GI: soft, nontender, distended, + BS MS: no deformity or atrophy  Skin: warm and dry, no rash Neuro:  Alert and Oriented x 3, Strength and sensation are intact Psych: euthymic mood, full affect  Wt Readings from Last 3 Encounters:  01/20/17 204 lb (92.5 kg)  11/16/16 202 lb (91.6 kg)  09/16/16 198 lb 8 oz (90 kg)      Studies/Labs Reviewed:   EKG:  EKG is ordered today.  The ekg ordered today demonstrates  afib at rate of 103  bpm  Recent Labs: 09/16/2016: ALT 22; BUN 25.2; Creatinine 1.2; HGB 12.9; Platelets 353; Potassium 4.5; Sodium 140   Lipid Panel    Component Value Date/Time   CHOL 128 05/30/2015 0932   TRIG 110 05/30/2015 0932   HDL 27 (L) 05/30/2015 0932   CHOLHDL 4.7 05/30/2015 0932   VLDL 22 05/30/2015 0932   LDLCALC 79 05/30/2015 0932    Additional studies/ records that were reviewed today include:   Echocardiogram: 05/30/15 LV EF: 35% -   40%  ------------------------------------------------------------------- Indications:      427.31 Atrial Fibrillation.  ------------------------------------------------------------------- History:  PMH:  Cardiomyopathy.  Congestive heart failure.  Risk factors:  Diabetes mellitus.  ------------------------------------------------------------------- Study Conclusions  - Left ventricle: The cavity size was moderately dilated. Wall   thickness was normal. Systolic function was moderately reduced.   The estimated ejection fraction was in the range of 35% to 40%.   Diffuse hypokinesis. There is akinesis of the   basal-midinferoseptal myocardium. There is akinesis of the   basal-midanteroseptal myocardium. - Aortic valve: Mildly calcified leaflets. - Mitral valve: There was mild to moderate regurgitation. - Left atrium: The atrium was moderately dilated. - Atrial septum: There was increased thickness of the septum,   consistent with lipomatous hypertrophy. - Tricuspid valve: There was mild-moderate regurgitation. - Pulmonary arteries: PA peak pressure: 37 mm Hg (S).  Impressions:  - The right ventricular systolic pressure was increased consistent   with mild pulmonary hypertension  Myoview 07/05/13 Overall Impression:  Low risk stress nuclear study with a small amount of septal, basal anteroseptal artifact. No significant reversible ischemia..  LV Wall Motion:  Non-gated study.   ASSESSMENT & PLAN:    1. Chronic systolic CHF - Mild   volume overload on exam. Continue Lasix 20 milligrams twice a day. Recheck BMP and BNP. Likely he SOB could be combination of CHF and elevated heart rate of afib. EF was 30-35% on 05/2015. Consider echo if no improvement.   2. Permanent atrial fibrillation - Has a history of permanent atrial fibrillation for over 18 years duration. She has not been on anticoagulation due to history of frequent nosebleeds in the past requiring cauterization.   She is aware of potential for CVA risk with atrial fibrillation compounding over the years. He again declined any anticoagulation.  Will increase Toprol-XL to 150 mg once a day. She will check her rate when it was elevated. If no improvement may consider 48-hour monitor to determine baseline heart rate  3. Hypertensive heart disease - Stable and well controlled. Continue current regimen.   Medication Adjustments/Labs and Tests Ordered: Current medicines are reviewed at length with the patient today.  Concerns regarding medicines are outlined above.  Medication changes, Labs and Tests ordered today are listed in the Patient Instructions below. Patient Instructions  Medication Instructions:  INCREASE metoprolol succinate (TOPROL Xl) to 150 mg daily.  Labwork: LABS TODAY: BMP, PRO BNP, DIGOXIN LEVEL  Testing/Procedures: NONE ORDERED  Follow-Up: Your physician recommends that you schedule a follow-up appointment in: 1 week at [facility] office.   Any Other Special Instructions Will Be Listed Below (If Applicable).     If you need a refill on your cardiac medications before your next appointment, please call your pharmacy.     Jarrett Soho, [place]  01/20/2017 4:34 PM    [facility] [address], [place], [place]  [address] Phone: [phone]; Fax: [phone]

## 2017-01-20 NOTE — Patient Instructions (Signed)
Medication Instructions:  INCREASE metoprolol succinate (TOPROL Xl) to 150 mg daily.  Labwork: LABS TODAY: BMP, PRO BNP, DIGOXIN LEVEL  Testing/Procedures: NONE ORDERED  Follow-Up: Your physician recommends that you schedule a follow-up appointment in: 1 week at [facility] office.   Any Other Special Instructions Will Be Listed Below (If Applicable).     If you need a refill on your cardiac medications before your next appointment, please call your pharmacy.

## 2017-01-20 NOTE — Telephone Encounter (Signed)
Returned call to patient:  Patient reports increase SOB x 1 month.  States she is always SOB but has increased over the last month.  Reports she is okay during the day but at night when she lays flat she wakes up SOB and wheezing.  Reports she has to go sleep in her recliner and take a "nerve pill" to calm down and "catch her breath".  Patient denies weighing herself daily as she does not have scales.    Patient reports she has been taking double Lasix x 1 month (20mg  BID) and this has helped but doesn't seem to be helping anymore.    Patient reports recently seeing her PCP and he cut her digoxin down to 3x weekly due to "too much digoxin in my system".  Reports she had repeat blood work 2 weeks ago by PCP and there was "still too much digoxin in my system", therefore digoxin was decreased to 2 times weekly (monday and Friday). Reports increase in HR but not dramatic increase.  Reports HR has been below 100 (72, 96, 80 reported).  Reports before medication change it was around 70.  Reports BP 149/80, no other readings to report.  Patient has appointment today with Bhagat PA at [facility] at 2:30PM.    Advised to take all medications with her to appt.  Pt verbalized understanding.    Routed to PA to make aware for appt today.

## 2017-01-21 ENCOUNTER — Telehealth: Payer: Self-pay | Admitting: *Deleted

## 2017-01-21 DIAGNOSIS — R7989 Other specified abnormal findings of blood chemistry: Secondary | ICD-10-CM

## 2017-01-21 LAB — DIGOXIN LEVEL: Digoxin, Serum: <0.4 ng/mL — ABNORMAL LOW (ref 0.5–0.9)

## 2017-01-21 LAB — BASIC METABOLIC PANEL
BUN / CREAT RATIO: 17 (ref 12–28)
BUN: 25 mg/dL (ref 8–27)
CO2: 24 mmol/L (ref 18–29)
Calcium: 9.3 mg/dL (ref 8.7–10.3)
Chloride: 103 mmol/L (ref 96–106)
Creatinine, Ser: 1.51 mg/dL — ABNORMAL HIGH (ref 0.57–1.00)
GFR calc Af Amer: 39 — ABNORMAL LOW (ref >59)
GFR, EST NON AFRICAN AMERICAN: 34 — AB (ref >59)
Glucose: 75 mg/dL (ref 65–99)
POTASSIUM: 4.5 mmol/L (ref 3.5–5.2)
SODIUM: 144 mmol/L (ref 134–144)

## 2017-01-21 LAB — PRO B NATRIURETIC PEPTIDE: NT-Pro BNP: 828 pg/mL — ABNORMAL HIGH (ref 0–738)

## 2017-01-21 MED ORDER — POTASSIUM CHLORIDE CRYS ER 20 MEQ PO TBCR: EXTENDED_RELEASE_TABLET | ORAL | 1 refills | Status: DC

## 2017-01-21 MED ORDER — FUROSEMIDE 20 MG PO TABS: ORAL_TABLET | ORAL | 1 refills | Status: DC

## 2017-01-21 NOTE — Telephone Encounter (Signed)
-----   Message from [place], [place] sent at 01/21/2017  9:12 AM EST ----- Elevated Scr and fluid marker. Increase lasix to 40mg  BID and Kdur to 39meq BID x 3 days. Then back to current dose of Lasix 20mg  BID and Kdur 27meq QD. Will need repeat BMP during follow up visit next week.

## 2017-01-21 NOTE — Telephone Encounter (Signed)
Pt has been made aware of her lab results. She will increse the Lasix to 20 mg 2 tablets bid X' 3 days and 2 potassium 10 meq daily X's 3 days then go back to Lasix 20 mg bid and Potassium 10 meq daily. We will recheck BMET on her f/u appt 01/28/17.  Pt verbalized understanding.

## 2017-01-28 ENCOUNTER — Ambulatory Visit (INDEPENDENT_AMBULATORY_CARE_PROVIDER_SITE_OTHER): Payer: PPO | Admitting: Nurse Practitioner

## 2017-01-28 ENCOUNTER — Encounter: Payer: Self-pay | Admitting: Nurse Practitioner

## 2017-01-28 VITALS — BP 161/81 | HR 98 | Ht 68.0 in | Wt 201.4 lb

## 2017-01-28 DIAGNOSIS — N183 Chronic kidney disease, stage 3 unspecified: Secondary | ICD-10-CM

## 2017-01-28 DIAGNOSIS — I10 Essential (primary) hypertension: Secondary | ICD-10-CM

## 2017-01-28 DIAGNOSIS — I482 Chronic atrial fibrillation, unspecified: Secondary | ICD-10-CM

## 2017-01-28 DIAGNOSIS — Z79899 Other long term (current) drug therapy: Secondary | ICD-10-CM | POA: Diagnosis not present

## 2017-01-28 DIAGNOSIS — I428 Other cardiomyopathies: Secondary | ICD-10-CM | POA: Diagnosis not present

## 2017-01-28 DIAGNOSIS — I5022 Chronic systolic (congestive) heart failure: Secondary | ICD-10-CM | POA: Diagnosis not present

## 2017-01-28 LAB — BASIC METABOLIC PANEL
BUN / CREAT RATIO: 21 (ref 12–28)
BUN: 30 mg/dL — AB (ref 8–27)
CALCIUM: 9.9 mg/dL (ref 8.7–10.3)
CHLORIDE: 101 mmol/L (ref 96–106)
CO2: 29 mmol/L (ref 18–29)
CREATININE: 1.44 mg/dL — AB (ref 0.57–1.00)
GFR calc Af Amer: 41 mL/min/{1.73_m2} — ABNORMAL LOW (ref >59)
GFR calc non Af Amer: 36 mL/min/{1.73_m2} — ABNORMAL LOW (ref >59)
GLUCOSE: 100 mg/dL — AB (ref 65–99)
Potassium: 4.4 mmol/L (ref 3.5–5.2)
Sodium: 137 mmol/L (ref 134–144)

## 2017-01-28 MED ORDER — FUROSEMIDE 20 MG PO TABS: ORAL_TABLET | ORAL | 6 refills | Status: DC

## 2017-01-28 MED ORDER — METOPROLOL SUCCINATE ER 200 MG PO TB24: 200.0000 mg | ORAL_TABLET | Freq: Every day | ORAL | 6 refills | Status: DC

## 2017-01-28 NOTE — Progress Notes (Addendum)
Office Visit    Patient Name: Sara Warner Date of Encounter: 01/28/2017  Primary Care Provider:  Leonides Sake, MD Primary Cardiologist:  Corky Downs, MD   Chief Complaint    76 year old female with history of permanent atrial fibrillation and nonischemic cardiomyopathy who presents for follow-up after recent bout of dyspnea and volume overload.  Past Medical History    Past Medical History:  Diagnosis Date  . Atrial fibrillation ([place])   . Chronic atrial fibrillation ([place])    a. Refuses [place].  CHA2DS2VASc = 4.  . Chronic systolic CHF (congestive heart failure) ([place])    a. 05/2015 Echo: EF 35-40%.  . CKD (chronic kidney disease), stage III   . 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 DM (diabetes mellitus) ([place])   . Elevated WBC count   . Hypertensive heart disease   . Hypothyroidism   . NICM (nonischemic cardiomyopathy) ([place])    a. 06/2013 MV: low risk study w/o ischemia;  b. 05/2015 Echo: EF 35-40%, diff HK, basal-midinferoseptal and basal-midanteroseptal AK. Mild-mod MR, mod dil LA, mild to mod TR, PASP 81mmHg.  Marland Kitchen Toxic goiter    Past Surgical History:  Procedure Laterality Date  . CARDIOVERSION    . CYSTECTOMY    . HYSTEROTOMY      Allergies  No Known Allergies  History of Present Illness    76 year old female with the above complex past medical history including permanent atrial fibrillation, nonischemic cardio myopathy with an EF 35-40%, CML, hypertension, hypothyroidism, diabetes, and stage III chronic kidney disease. She was recently seen in clinic with complaints of progressive dyspnea over a 3 to four-month period. She also indicated that her digoxin dose was reduced from 0.125 mg daily to 0.125 mg every Monday and Friday in the setting of elevated digoxin levels. We do not have those levels available to [place]. She says she was seeing some spots prior to reduction of digoxin dose. When she was seen last week, her BNP was elevated at 828. She was  advised to increase her Lasix to 40 mg twice a day 3 days and then drop back down to 20 mg twice a day. Since doing that, she has felt significantly better. Her abdomen is less distended and she has more exercise tolerance though notes that she is not quite back to her baseline from several months ago. We talked about her diet today, and she indicates that she eats at [place] regularly. This has not changed recently.  She has not been weighing herself at home and is unsure of what her dry weight is. It looks like by our records she was stable weight of 200 pounds. Over the past week, she has not had any PND, orthopnea, dizziness, syncope, edema, chest pain, palpitations, or early satiety. Heart rates have been elevated since digoxin dose reduced.   Home Medications    Prior to Admission medications   Medication Sig Start Date End Date Taking? Authorizing Provider  aspirin EC 81 MG tablet Take 1 tablet (81 mg total) by mouth daily. 07/10/15  Yes Troy Sine, MD  clorazepate (TRANXENE-T) 7.5 MG tablet Take 1 tablet (7.5 mg total) by mouth 2 (two) times daily as needed for anxiety. 01/10/13  Yes Maryanna Shape, NP  digoxin (LANOXIN) 0.125 MG tablet Take 0.125 mg by mouth 2 (two) times a week. Takes on Monday, and friday only   Yes Historical Provider, MD  fexofenadine (ALLEGRA) 180 MG tablet Take 180 mg by  mouth daily.   Yes Historical Provider, MD  furosemide (LASIX) 20 MG tablet Take 40 mg by mouth daily.   Yes Historical Provider, MD  glipiZIDE (GLUCOTROL XL) 10 MG 24 hr tablet Take 10 mg by mouth 2 (two) times daily. 07/07/14  Yes Historical Provider, MD  metFORMIN (GLUCOPHAGE-XR) 500 MG 24 hr tablet Take 1 tablet by mouth 2 (two) times daily. 11/07/16  Yes Historical Provider, MD  metoprolol succinate (TOPROL-XL) 50 MG 24 hr tablet Take 3 tablets (150 mg total) by mouth daily. Take with or immediately following a meal. 01/20/17 04/20/17 Yes Bhavinkumar Bhagat, PA  Mouthwashes  (MOUTHWASH/GARGLE) LIQD Swish for 30 second and spit out tid 06/17/15  Yes Historical Provider, MD  potassium chloride SA (K-DUR,KLOR-CON) 20 MEQ tablet Take 20 mEq by mouth daily.   Yes Historical Provider, MD  sitaGLIPtin (JANUVIA) 50 MG tablet Take 50 mg by mouth.  07/13/16  Yes Historical Provider, MD  SYNTHROID 150 MCG tablet Take 150 mcg by mouth daily. 01/25/15  Yes Historical Provider, MD  TASIGNA 150 MG capsule TAKE 2 CAPSULES BY MOUTH EVERY 12 HOURS 01/07/17  Yes Wyatt Portela, MD    Review of Systems    Overall feeling better since her last visit with more exercise tolerance but still not back to baseline. She denies chest pain, palpitations, PND, orthopnea, dizziness, syncope, edema, or early satiety.  All other systems reviewed and are otherwise negative except as noted above.  Physical Exam    VS:  BP (!) 161/81   Pulse 98   Ht 5\' 8"  (1.727 m)   Wt 201 lb 6.4 oz (91.4 kg)   SpO2 98%   BMI 30.62 kg/m  , BMI Body mass index is 30.62 kg/m. GEN: Well nourished, well developed, in no acute distress.  HEENT: normal.  Neck: Supple, JVP to jaw ,  no carotid bruits, or masses. CardiacIrregular , no murmurs, rubs, or gallops. No clubbing, cyanosis,  trace bilateral lower extremity edema.  Radials/DP/PT 2+ and equal bilaterally.  Respiratory:  Respirations regular and unlabored, clear to auscultation bilaterally. GISome of firm and protuberant, nontender, BS + x 4. MS: no deformity or atrophy. Skin: warm and dry, no rash. Neuro:  Strength and sensation are intact. Psych: Normal affect.  Accessory Clinical Findings    Lab Results  Component Value Date   CREATININE 1.51 (H) 01/20/2017   BUN 25 01/20/2017   NA 144 01/20/2017   K 4.5 01/20/2017   CL 103 01/20/2017   CO2 24 01/20/2017   BNP 828 01/20/2017  Assessment & Plan    1.  Chronic systolic congestive heart failure/nonischemic cardiomyopathy: Patient was seen last week with progressive dyspnea and evidence of volume  overload. Lasix dose was escalated for 3 days with improvement in symptoms. Weight is down 3 pounds since her last visit. She has not been weighing herself at home and indicates that she eats out regularly.  Her digoxin dose was recently reduced in the setting of elevated levels and visual disturbances. Post visual disturbances have resolved. She is currently only taking this twice a week. I suspect that in the setting of reduced digoxin dosing, her heart rates have been more elevated and she has possibly been lower output. In both of those cases, she has been more prone to volume overload. Heart rate is in the 90s today. I will titrate her Toprol to 200 mg daily. She has mild volume overload today. I have asked her to increase her Lasix to 40  mg in the morning and 20 mg in the afternoon. I will follow-up a basic metabolic panel today in the setting of chronic kidney disease. I will also arrange for repeat basic metabolic panel in 1 week and I will see her back in clinic in 2 weeks.  We discussed the importance of daily weights, sodium restriction, medication compliance, and symptom reporting and she verbalizes understanding.  Because of her history of atrial fibrillation, ivabradine may not be a substitute for digoxin, though this was considered.  2. Chronic atrial fibrillation: As above, HRs have been elevated with reduction of digoxin dosing. I will titrate her Toprol further. She is not on anticoagulation and is not interested in being on anticoagulation in the setting of prior history of nosebleeds.  3. Essential hypertension: Blood pressure is elevated today at 161/81. Increasing beta blocker as above.  4. Type 2 diabetes mellitus: This is followed by primary care and she remains on glipizide and metformin therapy.   5. Stage III chronic kidney dzs: Follow-up basic metabolic panel today and again in 1 week given escalation of Lasix dosing.  6. Follow-up basic metabolic panel today and again in 1  week. Follow up in clinic in 2 weeks.   Murray Hodgkins, NP 01/28/2017, 12:59 PM

## 2017-01-28 NOTE — Patient Instructions (Addendum)
Your physician has recommended you make the following change in your medication:   1.) the metoprolol succ has been increased to 200 mg daily.  2.) the furosemide has been increased to 40 mg in the morning and 20 mg in the evening.  New prescriptions has been sent to your pharmacy to reflect the changes.  Your physician recommends that you return for lab work in: TODAY and again in 1 weeks.  [facility]- [id number] recommends that you schedule a follow-up appointment in: 2 weeks with Ignacia Bayley

## 2017-02-04 DIAGNOSIS — Z79899 Other long term (current) drug therapy: Secondary | ICD-10-CM | POA: Diagnosis not present

## 2017-02-04 LAB — BASIC METABOLIC PANEL
BUN/Creatinine Ratio: 20 (ref 12–28)
BUN: 26 mg/dL (ref 8–27)
CALCIUM: 9.1 mg/dL (ref 8.7–10.3)
CO2: 28 mmol/L (ref 18–29)
Chloride: 102 mmol/L (ref 96–106)
Creatinine, Ser: 1.33 mg/dL — ABNORMAL HIGH (ref 0.57–1.00)
GFR, EST AFRICAN AMERICAN: 45 mL/min/{1.73_m2} — AB (ref >59)
GFR, EST NON AFRICAN AMERICAN: 39 mL/min/{1.73_m2} — AB (ref >59)
Glucose: 130 mg/dL — ABNORMAL HIGH (ref 65–99)
Potassium: 4.1 mmol/L (ref 3.5–5.2)
Sodium: 137 mmol/L (ref 134–144)

## 2017-02-08 ENCOUNTER — Other Ambulatory Visit: Payer: Self-pay | Admitting: Oncology

## 2017-02-09 MED FILL — TASIGNA 150 MG CAPSULE: 150 | 28 days supply | Qty: 112 | Fill #0

## 2017-02-11 ENCOUNTER — Encounter: Payer: Self-pay | Admitting: Nurse Practitioner

## 2017-02-11 ENCOUNTER — Ambulatory Visit (INDEPENDENT_AMBULATORY_CARE_PROVIDER_SITE_OTHER): Payer: PPO | Admitting: Nurse Practitioner

## 2017-02-11 VITALS — BP 124/76 | HR 90 | Ht 68.0 in | Wt 197.0 lb

## 2017-02-11 DIAGNOSIS — I428 Other cardiomyopathies: Secondary | ICD-10-CM

## 2017-02-11 DIAGNOSIS — N183 Chronic kidney disease, stage 3 unspecified: Secondary | ICD-10-CM

## 2017-02-11 DIAGNOSIS — I482 Chronic atrial fibrillation, unspecified: Secondary | ICD-10-CM

## 2017-02-11 DIAGNOSIS — I5022 Chronic systolic (congestive) heart failure: Secondary | ICD-10-CM

## 2017-02-11 DIAGNOSIS — I110 Hypertensive heart disease with heart failure: Secondary | ICD-10-CM | POA: Diagnosis not present

## 2017-02-11 MED ORDER — LOSARTAN POTASSIUM 25 MG PO TABS: 25.0000 mg | ORAL_TABLET | Freq: Every day | ORAL | 5 refills | Status: DC

## 2017-02-11 NOTE — Patient Instructions (Signed)
Medication Instructions:  Start taking losartan 25mg  daily.   Labwork:  BMET in 1 week ([facility])  Testing/Procedures: none  Follow-Up: w/ Gerald Stabs or Dr. Claiborne Billings in 1 month.  If you need a refill on your cardiac medications before your next appointment, please call your pharmacy.

## 2017-02-11 NOTE — Progress Notes (Signed)
Office Visit    Patient Name: Sara Warner Date of Encounter: 02/11/2017  Primary Care Provider:  Leonides Sake, MD Primary Cardiologist:  Corky Downs, MD   Chief Complaint    76 y/o ? with a h/o permanent afib and NICM, who presents for f/u related to [place].  Past Medical History    Past Medical History:  Diagnosis Date  . Atrial fibrillation ([place])   . Chronic atrial fibrillation ([place])    a. Refuses [place].  CHA2DS2VASc = 4.  . Chronic systolic CHF (congestive heart failure) ([place])    a. 05/2015 Echo: EF 35-40%.  . CKD (chronic kidney disease), stage III   . 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 DM (diabetes mellitus) ([place])   . Elevated WBC count   . Hypertensive heart disease   . Hypothyroidism   . NICM (nonischemic cardiomyopathy) ([place])    a. 06/2013 MV: low risk study w/o ischemia;  b. 05/2015 Echo: EF 35-40%, diff HK, basal-midinferoseptal and basal-midanteroseptal AK. Mild-mod MR, mod dil LA, mild to mod TR, PASP 64mmHg.  Marland Kitchen Toxic goiter    Past Surgical History:  Procedure Laterality Date  . CARDIOVERSION    . CYSTECTOMY    . HYSTEROTOMY      Allergies  Allergies  Allergen Reactions  . Lisinopril Cough    History of Present Illness    76 y/o ? with a h/o permanent afib (not on [place] due to h/o nosebleeds - pt preference), NICM (EF 35-40%), CML, HTN, hypothyroidism, DM II, and CKD III.  I recently saw her on 3/1 in the setting of DOE, wt gain, and volume overload.  I increased her lasix to 40 in the am and 20 in the pm.  BMET a week later was stable.  Since last visit, she has Had improvement in exercise tolerance and dyspnea. Weight is down 5 pounds since her last visit. She denies PND, orthopnea, dizziness, syncope, edema, or early satiety. She does have some dyspnea on exertion still though this is overall improved. She has not had any chest pain.  Home Medications    Prior to Admission medications   Medication Sig Start Date End Date Taking?  Authorizing Provider  aspirin EC 81 MG tablet Take 1 tablet (81 mg total) by mouth daily. 07/10/15  Yes Troy Sine, MD  clorazepate (TRANXENE-T) 7.5 MG tablet Take 1 tablet (7.5 mg total) by mouth 2 (two) times daily as needed for anxiety. 01/10/13  Yes Maryanna Shape, NP  digoxin (LANOXIN) 0.125 MG tablet Take 0.125 mg by mouth 2 (two) times a week. Takes on Monday, and friday only   Yes Historical Provider, MD  fexofenadine (ALLEGRA) 180 MG tablet Take 180 mg by mouth daily.   Yes Historical Provider, MD  furosemide (LASIX) 20 MG tablet Take 2 tablets in the morning ( 40 mg) and 1 tablet in the evening (20mg ). 01/28/17  Yes Rogelia Mire, NP  glipiZIDE (GLUCOTROL XL) 10 MG 24 hr tablet Take 10 mg by mouth 2 (two) times daily. 07/07/14  Yes Historical Provider, MD  metFORMIN (GLUCOPHAGE-XR) 500 MG 24 hr tablet Take 1 tablet by mouth 2 (two) times daily. 11/07/16  Yes Historical Provider, MD  metoprolol (TOPROL XL) 200 MG 24 hr tablet Take 1 tablet (200 mg total) by mouth daily. 01/28/17  Yes Rogelia Mire, NP  Mouthwashes (MOUTHWASH/GARGLE) LIQD Swish for 30 second and spit out tid 06/17/15  Yes Historical Provider, MD  potassium chloride SA (K-DUR,KLOR-CON)  20 MEQ tablet Take 20 mEq by mouth daily.   Yes Historical Provider, MD  sitaGLIPtin (JANUVIA) 50 MG tablet Take 50 mg by mouth.  07/13/16  Yes Historical Provider, MD  SYNTHROID 150 MCG tablet Take 150 mcg by mouth daily. 01/25/15  Yes Historical Provider, MD  TASIGNA 150 MG capsule TAKE 2 CAPSULES BY MOUTH EVERY 12 HOURS 02/09/17  Yes Wyatt Portela, MD  losartan (COZAAR) 25 MG tablet Take 1 tablet (25 mg total) by mouth daily. 02/11/17 05/12/17  Rogelia Mire, NP    Review of Systems    She continues to have some amount of dyspnea on exertion but overall this is improved. She denies chest pain, palpitations, PND, orthopnea, dizziness, syncope, edema, or early satiety.  All other systems reviewed and are otherwise negative except  as noted above.  Physical Exam    VS:  BP 124/76   Pulse 90   Ht 5\' 8"  (1.727 m)   Wt 197 lb (89.4 kg)   BMI 29.95 kg/m  , BMI Body mass index is 29.95 kg/m. GEN: Well nourished, well developed, in no acute distress.  HEENT: normal.  Neck: Supple, no JVD, carotid bruits, or masses. Cardiac: Irregularly irregular, no murmurs, rubs, or gallops. No clubbing, cyanosis, edema.  Radials/DP/PT 2+ and equal bilaterally.  Respiratory:  Respirations regular and unlabored, clear to auscultation bilaterally. GI: Soft, nontender, nondistended, BS + x 4. MS: no deformity or atrophy. Skin: warm and dry, no rash. Neuro:  Strength and sensation are intact. Psych: Normal affect.  Accessory Clinical Findings    Lab Results  Component Value Date   CREATININE 1.33 (H) 02/04/2017   BUN 26 02/04/2017   NA 137 02/04/2017   K 4.1 02/04/2017   CL 102 02/04/2017   CO2 28 02/04/2017    Assessment & Plan    1.  Chronic HFrEF/nonischemic cardiomyopathy myopathy: EF 35-40% by echo in 2016. I recently saw her with increased weight and volume adjusted her Lasix to 40 mg in the a.m. and 20 mg in the p.m. Since then, she has come down 5 pounds and is euvolemic on exam today. She still has some dyspnea on exertion but overall symptoms have improved. Blood pressure is well controlled while heart rate remains mildly elevated in the setting of reduction in digoxin dose due to reportedly elevated levels and visual disturbances. When I last saw her, increase her metoprolol to 200 mg daily. She has not a candidate for ivabradine secondary to A. fib. I note that she is not on an ACE inhibitor or ARB. She had previously been on lisinopril but this was discontinued in the setting of a cough in 2013. I'm adding low-dose losartan 25 mg daily and will follow-up a basic metabolic panel next week. If she tolerates this with stable renal function, we could consider transition to contrast if financially feasible.  2. Chronic  atrial fibrillation: Heart rates mildly elevated in the setting of reduced digoxin dose as outlined above. She is now on Toprol 200 mg daily. She is asymptomatic. She is not on oral anticoagulation in the setting of prior history of nosebleeds and is not interested in reconsidering this.  3. Essential hypertension: Blood pressure is stable today at 12/23/1974. She says that it typically runs in the 150s at home. I am adding losartan therapy.  4. Type 2 diabetes mellitus: This is followed by primary care and she remains on glipizide and metformin therapy.  5. Stage III chronic kidney disease: I will  be adding losartan therapy and will follow-up a basic metabolic panel in 1 week.  6. Disposition: Follow-up basic metabolic panel 1 week. Follow up in clinic in one month or sooner if necessary.   Murray Hodgkins, NP 02/11/2017, 1:15 PM

## 2017-02-18 DIAGNOSIS — Z79899 Other long term (current) drug therapy: Secondary | ICD-10-CM | POA: Diagnosis not present

## 2017-02-19 ENCOUNTER — Telehealth: Payer: Self-pay | Admitting: *Deleted

## 2017-02-19 DIAGNOSIS — Z79899 Other long term (current) drug therapy: Secondary | ICD-10-CM

## 2017-02-19 LAB — BASIC METABOLIC PANEL
BUN / CREAT RATIO: 17 (ref 12–28)
BUN: 37 mg/dL — AB (ref 8–27)
CO2: 24 mmol/L (ref 18–29)
CREATININE: 2.17 mg/dL — AB (ref 0.57–1.00)
Calcium: 10 mg/dL (ref 8.7–10.3)
Chloride: 100 mmol/L (ref 96–106)
GFR calc non Af Amer: 22 mL/min/{1.73_m2} — ABNORMAL LOW (ref >59)
GFR, EST AFRICAN AMERICAN: 25 mL/min/{1.73_m2} — AB (ref >59)
Glucose: 84 mg/dL (ref 65–99)
Potassium: 5 mmol/L (ref 3.5–5.2)
Sodium: 143 mmol/L (ref 134–144)

## 2017-02-19 MED ORDER — FUROSEMIDE 20 MG PO TABS: ORAL_TABLET | ORAL | 6 refills | Status: DC

## 2017-02-19 NOTE — Telephone Encounter (Signed)
Spoke to patient to relay recommendations. She verbalized understanding. Rx revised and sent to pharmacy. [facility] order sent for repeat BMET.  Pt also informs me she stopped taking the cozaar due to having SE of recurrent vivid dreams at night. This medication replaced lisinopril. Routed to provider for advice - should she just remain off this or does she need alternate med?

## 2017-02-19 NOTE — Telephone Encounter (Signed)
She may remain off of losartan.  She would likely have similar symptoms on an alternate arb.

## 2017-02-19 NOTE — Telephone Encounter (Signed)
-----   Message from Rogelia Mire, NP sent at 02/19/2017  7:46 AM EDT ----- BUN and creat are elevated above baseline, suggesting that she is a little dry.  She should cut lasix back to 40 daily and only take an additional 20 mg as needed for wt gain of 2 lbs in 24 hrs.  F/u bmet next Thursday.

## 2017-02-19 NOTE — Telephone Encounter (Signed)
Patient aware of recommendations, voiced thanks for follow up call.

## 2017-02-25 DIAGNOSIS — M545 Low back pain: Secondary | ICD-10-CM | POA: Diagnosis not present

## 2017-02-25 DIAGNOSIS — Z79899 Other long term (current) drug therapy: Secondary | ICD-10-CM | POA: Diagnosis not present

## 2017-02-25 DIAGNOSIS — Z6831 Body mass index (BMI) 31.0-31.9, adult: Secondary | ICD-10-CM | POA: Diagnosis not present

## 2017-02-25 DIAGNOSIS — R3911 Hesitancy of micturition: Secondary | ICD-10-CM | POA: Diagnosis not present

## 2017-02-26 LAB — BASIC METABOLIC PANEL
BUN / CREAT RATIO: 21 (ref 12–28)
BUN: 30 mg/dL — ABNORMAL HIGH (ref 8–27)
CHLORIDE: 102 mmol/L (ref 96–106)
CO2: 22 mmol/L (ref 18–29)
Calcium: 9.1 mg/dL (ref 8.7–10.3)
Creatinine, Ser: 1.42 mg/dL — ABNORMAL HIGH (ref 0.57–1.00)
GFR, EST AFRICAN AMERICAN: 42 mL/min/{1.73_m2} — AB (ref >59)
GFR, EST NON AFRICAN AMERICAN: 36 mL/min/{1.73_m2} — AB (ref >59)
Glucose: 67 mg/dL (ref 65–99)
POTASSIUM: 4.3 mmol/L (ref 3.5–5.2)
Sodium: 142 mmol/L (ref 134–144)

## 2017-03-08 ENCOUNTER — Other Ambulatory Visit: Payer: Self-pay | Admitting: Oncology

## 2017-03-08 MED FILL — TASIGNA 150 MG CAPSULE: 150 | 28 days supply | Qty: 112 | Fill #0

## 2017-03-11 ENCOUNTER — Ambulatory Visit (INDEPENDENT_AMBULATORY_CARE_PROVIDER_SITE_OTHER): Payer: PPO | Admitting: Nurse Practitioner

## 2017-03-11 ENCOUNTER — Encounter: Payer: Self-pay | Admitting: Nurse Practitioner

## 2017-03-11 VITALS — BP 140/68 | HR 99 | Ht 68.0 in | Wt 199.6 lb

## 2017-03-11 DIAGNOSIS — I10 Essential (primary) hypertension: Secondary | ICD-10-CM

## 2017-03-11 DIAGNOSIS — N183 Chronic kidney disease, stage 3 unspecified: Secondary | ICD-10-CM

## 2017-03-11 DIAGNOSIS — I482 Chronic atrial fibrillation, unspecified: Secondary | ICD-10-CM

## 2017-03-11 DIAGNOSIS — I5022 Chronic systolic (congestive) heart failure: Secondary | ICD-10-CM

## 2017-03-11 NOTE — Patient Instructions (Signed)
Medication Instructions:  Your physician recommends that you continue on your current medications as directed. Please refer to the Current Medication list given to you today.   Follow-Up: Your physician recommends that you schedule a follow-up appointment in: 3 MONTHS with Dr. Claiborne Billings.   Any Other Special Instructions Will Be Listed Below (If Applicable).     If you need a refill on your cardiac medications before your next appointment, please call your pharmacy.

## 2017-03-11 NOTE — Progress Notes (Signed)
Office Visit    Patient Name: Sara Warner Date of Encounter: 03/11/2017  Primary Care Provider:  Leonides Sake, MD Primary Cardiologist:  Corky Downs, MD   Chief Complaint    76 year old female with a history of permanent atrial fibrillation and nonischemic cardiomyopathy, who presents for follow-up.  Past Medical History    Past Medical History:  Diagnosis Date  . Atrial fibrillation ([place])   . Chronic atrial fibrillation ([place])    a. Refuses [place].  [id number] = 4.  . Chronic systolic CHF (congestive heart failure) ([place])    a. 05/2015 Echo: EF 35-40%.  . CKD (chronic kidney disease), stage III   . 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 DM (diabetes mellitus) ([place])   . Elevated WBC count   . Hypertensive heart disease   . Hypothyroidism   . NICM (nonischemic cardiomyopathy) ([place])    a. 06/2013 MV: low risk study w/o ischemia;  b. 05/2015 Echo: EF 35-40%, diff HK, basal-midinferoseptal and basal-midanteroseptal AK. Mild-mod MR, mod dil LA, mild to mod TR, PASP 39mmHg.  Marland Kitchen Toxic goiter    Past Surgical History:  Procedure Laterality Date  . CARDIOVERSION    . CYSTECTOMY    . HYSTEROTOMY      Allergies  Allergies  Allergen Reactions  . Lisinopril Cough  . Losartan Other (See Comments)    Patient reports nightmares/vivid dreams when taking.    History of Present Illness    76 year old female with a history of permanent atrial fibrillation. She is not on anticoagulation secondary to prior history of nosebleeds and preference to remain off of anticoagulation. She also has a history of nonischemic cardiomyopathy with an EF of 35-40%, CML, hypertension, hypothyroidism, type 2 diabetes mellitus, and stage III chronic kidney disease. I saw her earlier this year in the setting of dyspnea on exertion and weight gain. She responded well to increasing her Lasix to 40 mg in the a.m. and 20 mg in the p.m. BUN and creatinine were elevated above baseline on March 22,  and we asked her to reduce her Lasix back to 40 mg daily with a plan to take 20 mg daily when necessary for weight gain. At her last visit, I also added losartan therapy, however this resulted in vivid dreams, and she discontinued this.  Since her last visit, she has done reasonably well. She has been taking Lasix 40 mg daily about once a week, she has to take an additional 20 mg. Up to this point, she has been basing whether or not she takes an additional 20 mg on whether or not she feels short of breath with activity. She hasn't been using her weight as a trigger but in discussing it today, it does appear that her weight does begin to elevate a day or 2 before she ends up taking it. We discussed why it would be important to take it on the day that her weight elevates in order to potentially prevent symptoms. For the most part, her weight on her home scale has stayed around 200 pounds but does sometimes elevate 202 or 203. She has not had any chest pain and denies PND, orthopnea, dizziness, syncope, edema, palpitations, or early satiety.  Home Medications    Prior to Admission medications   Medication Sig Start Date End Date Taking? Authorizing Provider  aspirin EC 81 MG tablet Take 1 tablet (81 mg total) by mouth daily. 07/10/15  Yes Troy Sine, MD  clorazepate (TRANXENE-T) 7.5 MG tablet Take  1 tablet (7.5 mg total) by mouth 2 (two) times daily as needed for anxiety. 01/10/13  Yes Maryanna Shape, NP  digoxin (LANOXIN) 0.125 MG tablet Take 0.125 mg by mouth 2 (two) times a week. Takes on Monday, and friday only   Yes Historical Provider, MD  fexofenadine (ALLEGRA) 180 MG tablet Take 180 mg by mouth daily.   Yes Historical Provider, MD  furosemide (LASIX) 20 MG tablet Take 2 tablets in the morning ( 40 mg). Take 1 tablet (20mg ) as needed for 2 lb gain over 24 hours. 02/19/17  Yes Rogelia Mire, NP  glipiZIDE (GLUCOTROL XL) 10 MG 24 hr tablet Take 10 mg by mouth 2 (two) times daily. 07/07/14  Yes  Historical Provider, MD  metFORMIN (GLUCOPHAGE-XR) 500 MG 24 hr tablet Take 1 tablet by mouth 2 (two) times daily. 11/07/16  Yes Historical Provider, MD  metoprolol (TOPROL XL) 200 MG 24 hr tablet Take 1 tablet (200 mg total) by mouth daily. 01/28/17  Yes Rogelia Mire, NP  Mouthwashes (MOUTHWASH/GARGLE) LIQD Swish for 30 second and spit out tid 06/17/15  Yes Historical Provider, MD  potassium chloride SA (K-DUR,KLOR-CON) 20 MEQ tablet Take 20 mEq by mouth daily.   Yes Historical Provider, MD  sitaGLIPtin (JANUVIA) 50 MG tablet Take 50 mg by mouth.  07/13/16  Yes Historical Provider, MD  SYNTHROID 150 MCG tablet Take 150 mcg by mouth daily. 01/25/15  Yes Historical Provider, MD  TASIGNA 150 MG capsule TAKE 2 CAPSULES BY MOUTH EVERY 12 HOURS 03/08/17  Yes Wyatt Portela, MD    Review of Systems    About once a week, she takes an additional half of Lasix due to dyspnea and weight gain.  She denies chest pain, palpitations, pnd, orthopnea, n, v, dizziness, syncope, edema, weight gain, or early satiety.  All other systems reviewed and are otherwise negative except as noted above.  Physical Exam    VS:  BP 140/68   Ht 5\' 8"  (1.727 m)   Wt 199 lb 9.6 oz (90.5 kg)   BMI 30.35 kg/m  , BMI Body mass index is 30.35 kg/m.  Repeat blood pressure 120/62. GEN: Well nourished, well developed, in no acute distress.  HEENT: normal.  Neck: Supple, no JVD, carotid bruits, or masses. Cardiac: IR, IR, no murmurs, rubs, or gallops. No clubbing, cyanosis, edema.  Radials/DP/PT 2+ and equal bilaterally.  Respiratory:  Respirations regular and unlabored, clear to auscultation bilaterally. GI: Soft, nontender, nondistended, BS + x 4. MS: no deformity or atrophy. Skin: warm and dry, no rash. Neuro:  Strength and sensation are intact. Psych: Normal affect.  Accessory Clinical Findings    ECG - Atrial fibrillation, 99, nonspecific T changes.  Lab Results  Component Value Date   CREATININE 1.42 (H)  02/25/2017   BUN 30 (H) 02/25/2017   NA 142 02/25/2017   K 4.3 02/25/2017   CL 102 02/25/2017   CO2 22 02/25/2017     Assessment & Plan    1.  Chronic HFrEF/nonischemic cardiomyopathy: EF 35-40% by echo in 2016. Since her last visit, she has been relatively stable from a volume standpoint. Weight is trending roughly 200 pounds on her home scale. She is 199 on our scale today. She remains on Lasix 40 mg daily and takes an additional 20 mg about once a week due to mild dyspnea and weight gain. Of note, she did previously bump BUN and creatinine when she was taking Lasix 40 morning and 20 in the  afternoon on a regular basis. When she was last seen, I added losartan, however she developed vivid dreams and restlessness at night and requested discontinuing this. Upon doing so, the symptoms resolved. She was previously intolerant to ACE inhibitor secondary to cough. She remains on maximum dose Toprol-XL therapy. We could consider hydralazine and nitrates for afterload reduction. Initial blood pressure today was 140/68, however on repeat, she was 120/62. I'm not sure that she would tolerate hydralazine and nitrates. We can readdress this at follow-up. With elevation of BUN/creatinine on a slightly higher dose of Lasix, I also do not think that she would tolerate spironolactone daily.  We discussed the importance of daily weights, sodium restriction, medication compliance, and symptom reporting and she verbalizes understanding.   2. Chronic atrial fibrillation: Heart rate is stable with a goal of less than 100. She remains on Toprol-XL 200 mg daily. She is asymptomatic. She is not on oral anticoagulation secondary to prior history of nosebleeds and is not interested in reconsidering this now.  3. Essential hypertension: Blood pressure stable on repeat. Continue beta blocker and diuretic therapy.  4. Type 2 diabetes mellitus: Followed by primary care. She remains on glipizide and metformin.  5. Stage III  chronic kidney disease: Follow-up BUN/creatinine were stable once we discontinued losartan and reduced her Lasix dose.  6. Disposition: She will follow up with Dr. Claiborne Billings in 3 months or sooner if necessary.   Murray Hodgkins, NP 03/11/2017, 2:05 PM

## 2017-03-17 ENCOUNTER — Telehealth: Payer: Self-pay | Admitting: Oncology

## 2017-03-17 ENCOUNTER — Ambulatory Visit (HOSPITAL_BASED_OUTPATIENT_CLINIC_OR_DEPARTMENT_OTHER): Payer: PPO | Admitting: Oncology

## 2017-03-17 ENCOUNTER — Other Ambulatory Visit (HOSPITAL_BASED_OUTPATIENT_CLINIC_OR_DEPARTMENT_OTHER): Payer: PPO

## 2017-03-17 VITALS — BP 156/84 | HR 94 | Temp 98.1°F | Resp 18 | Ht 68.0 in | Wt 199.7 lb

## 2017-03-17 DIAGNOSIS — C9211 Chronic myeloid leukemia, BCR/ABL-positive, in remission: Secondary | ICD-10-CM

## 2017-03-17 DIAGNOSIS — C9210 Chronic myeloid leukemia, BCR/ABL-positive, not having achieved remission: Secondary | ICD-10-CM

## 2017-03-17 DIAGNOSIS — E119 Type 2 diabetes mellitus without complications: Secondary | ICD-10-CM

## 2017-03-17 LAB — CBC WITH DIFFERENTIAL/PLATELET
BASO%: 0.9 % (ref 0.0–2.0)
Basophils Absolute: 0.1 10*3/uL (ref 0.0–0.1)
EOS ABS: 0.2 10*3/uL (ref 0.0–0.5)
EOS%: 2.2 % (ref 0.0–7.0)
HCT: 41.4 % (ref 34.8–46.6)
HGB: 13 g/dL (ref 11.6–15.9)
LYMPH%: 27.1 % (ref 14.0–49.7)
MCH: 26.7 pg (ref 25.1–34.0)
MCHC: 31.4 g/dL — ABNORMAL LOW (ref 31.5–36.0)
MCV: 85 fL (ref 79.5–101.0)
MONO#: 0.7 10*3/uL (ref 0.1–0.9)
MONO%: 6.5 % (ref 0.0–14.0)
NEUT%: 63.3 % (ref 38.4–76.8)
NEUTROS ABS: 6.6 10*3/uL — AB (ref 1.5–6.5)
Platelets: 326 10*3/uL (ref 145–400)
RBC: 4.87 10*6/uL (ref 3.70–5.45)
RDW: 15.4 % — AB (ref 11.2–14.5)
WBC: 10.4 10*3/uL — AB (ref 3.9–10.3)
lymph#: 2.8 10*3/uL (ref 0.9–3.3)

## 2017-03-17 LAB — COMPREHENSIVE METABOLIC PANEL
ALBUMIN: 4.4 g/dL (ref 3.5–5.0)
ALK PHOS: 48 U/L (ref 40–150)
ALT: 25 U/L (ref 0–55)
AST: 19 U/L (ref 5–34)
Anion Gap: 11 mEq/L (ref 3–11)
BUN: 26.1 mg/dL — ABNORMAL HIGH (ref 7.0–26.0)
CHLORIDE: 103 meq/L (ref 98–109)
CO2: 26 meq/L (ref 22–29)
Calcium: 9.5 mg/dL (ref 8.4–10.4)
Creatinine: 1.5 mg/dL — ABNORMAL HIGH (ref 0.6–1.1)
EGFR: 34 mL/min/{1.73_m2} — AB (ref >90)
Glucose: 174 mg/dl — ABNORMAL HIGH (ref 70–140)
POTASSIUM: 4.7 meq/L (ref 3.5–5.1)
Sodium: 140 mEq/L (ref 136–145)
Total Bilirubin: 1.35 mg/dL — ABNORMAL HIGH (ref 0.20–1.20)
Total Protein: 7.1 g/dL (ref 6.4–8.3)

## 2017-03-17 NOTE — Progress Notes (Signed)
Hematology and Oncology Follow Up Visit  Sara Warner [id number] 06-23-1941 76 y.o. 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 Diagnosed a bone marrow biopsy on 10/24/2012.   Current treatment: Tasigna 300 mg bid started 11/2012 she achieved complete hematological and molecular remission.  Interim History: Sara Warner presents today for a follow up visit by her self. Since her last visit, she reports no major changes in her health. She does report fluid retention and currently on Lasix prescribed by her cardiologist. She denied any dyspnea on exertion or chest pain. She continues to live independently and attends to activities of daily living. She denied any recent hospitalizations or illnesses.  She takes Tasigna without any new complications. She denied any hypoglycemia or any thrombotic events. She denied any GI side effects. She does have multiple skin lesions and there were noted to be benign.  She  does not report any fevers, does not report any chills, does not report any lymphadenopathy. She has not had any constitutional symptoms. She does not report any back pain or bony pain. She has not reported any recent hospitalizations. She does not report any chest pain but does report occasional palpitations. She does not report any dyspnea on exertion or leg edema. She does not report any cough or hemoptysis. He does not report any nausea, vomiting, abdominal pain. She does not report any hematochezia or melena. The remaining review of systems unremarkable.   Medications: I have reviewed the patient's current medications. Current Outpatient Prescriptions  Medication Sig Dispense Refill  . aspirin EC 81 MG tablet Take 1 tablet (81 mg total) by mouth daily. 90 tablet 3  . clorazepate (TRANXENE-T) 7.5 MG tablet Take 1 tablet (7.5 mg total) by mouth 2 (two) times daily as needed for anxiety. 30 tablet 1  . digoxin  (LANOXIN) 0.125 MG tablet Take 0.125 mg by mouth 2 (two) times a week. Takes on Monday, and friday only    . fexofenadine (ALLEGRA) 180 MG tablet Take 180 mg by mouth daily.    . furosemide (LASIX) 20 MG tablet Take 2 tablets in the morning ( 40 mg). Take 1 tablet (58m) as needed for 2 lb gain over 24 hours. 90 tablet 6  . glipiZIDE (GLUCOTROL XL) 10 MG 24 hr tablet Take 10 mg by mouth 2 (two) times daily.    . metFORMIN (GLUCOPHAGE-XR) 500 MG 24 hr tablet Take 1 tablet by mouth 2 (two) times daily.    . metoprolol (TOPROL XL) 200 MG 24 hr tablet Take 1 tablet (200 mg total) by mouth daily. 30 tablet 6  . Mouthwashes (MOUTHWASH/GARGLE) LIQD Swish for 30 second and spit out tid  2  . potassium chloride SA (K-DUR,KLOR-CON) 20 MEQ tablet Take 20 mEq by mouth daily.    . sitaGLIPtin (JANUVIA) 50 MG tablet Take 50 mg by mouth.     . SYNTHROID 150 MCG tablet Take 150 mcg by mouth daily.  1  . TASIGNA 150 MG capsule TAKE 2 CAPSULES BY MOUTH EVERY 12 HOURS 112 capsule 0   No current facility-administered medications for this visit.     Allergies:  Allergies  Allergen Reactions  . Losartan Other (See Comments)    Patient reports nightmares/vivid dreams when taking.  . Lisinopril Cough    Past Medical History, Surgical history, Social history, and Family History were reviewed and updated.   Physical Exam: Blood pressure (!) 156/84, pulse 94, temperature 98.1 F (  36.7 C), temperature source Oral, resp. rate 18, height _0  (1.727 m), weight 199 lb 11.2 oz (90.6 kg), SpO2 99 %. ECOG: 1 General appearance: Alert, awake or without distress. Head: Normocephalic, without obvious abnormality no oral thrush or ulcers. Neck: no adenopathy Lymph nodes: Cervical, supraclavicular, and axillary nodes normal. Heart:irregularly irregular rhythm no murmurs. Lung:chest clear, no wheezing, rales, normal symmetric air entry. Abdomen: soft, non-tender, without masses or organomegaly no shifting dullness or  ascites. EXT:no erythema, induration, or nodules Skin: No rashes or lesions.  Lab Results: Lab Results  Component Value Date   WBC 10.4 (H) 03/17/2017   HGB 13.0 03/17/2017   HCT 41.4 03/17/2017   MCV 85.0 03/17/2017   PLT 326 03/17/2017     Chemistry      Component Value Date/Time   NA 140 03/17/2017 0907   K 4.7 03/17/2017 0907   CL 102 02/25/2017 1610   CL 106 04/26/2013 1252   CO2 26 03/17/2017 0907   BUN 26.1 (H) 03/17/2017 0907   CREATININE 1.5 (H) 03/17/2017 0907      Component Value Date/Time   CALCIUM 9.5 03/17/2017 0907   ALKPHOS 48 03/17/2017 0907   AST 19 03/17/2017 0907   ALT 25 03/17/2017 0907   BILITOT 1.35 (H) 03/17/2017 0907         Impression and Plan:   76 year old woman with the following issues:  1. CML.  Chronic phase at this time. On Tasigna since 11/2012 with normalization of her her WBC. She is tolerating this medication well. She has achieved a complete hematological response. Last molecular studies done in October 2015 did not show evidence of residual disease in the form of BCR-ABL  The plan is to continue the same dose and schedule without any modifications. Her molecular studies In 2015 showed complete molecular response.   The plan is to continue with the same dose and schedule and monitor her molecular testing periodically.  2. Diabetes mellitus: She is currently on metformin without any complications. I continue to educate her about the potential hypoglycemia fact of Tasigna. Blood sugar have been reasonably controlled.  3. Cardiomyopathy: There is no evidence of exacerbation related to Tasigna. Very mild edema noted and currently on Lasix.  4. Hematuria: No longer reporting any symptoms regarding this issue.  5. Follow-up:  6 months.      Kourtlynn Trevor 4/18/20189:46 AM

## 2017-03-17 NOTE — Telephone Encounter (Signed)
Appointments scheduled per 4.18.18 LOS. Patient given AVS report and calendars with future scheduled appointments. °

## 2017-03-31 ENCOUNTER — Ambulatory Visit (INDEPENDENT_AMBULATORY_CARE_PROVIDER_SITE_OTHER): Payer: PPO | Admitting: Podiatry

## 2017-03-31 DIAGNOSIS — E1142 Type 2 diabetes mellitus with diabetic polyneuropathy: Secondary | ICD-10-CM | POA: Diagnosis not present

## 2017-03-31 DIAGNOSIS — L84 Corns and callosities: Secondary | ICD-10-CM

## 2017-03-31 DIAGNOSIS — L03032 Cellulitis of left toe: Secondary | ICD-10-CM

## 2017-03-31 MED ORDER — CEPHALEXIN 500 MG PO CAPS: 500.0000 mg | ORAL_CAPSULE | Freq: Three times a day (TID) | ORAL | 1 refills | Status: DC

## 2017-03-31 NOTE — Progress Notes (Signed)
Patient ID: Sara Warner, female   DOB: 1941-04-24, 76 y.o.   MRN: [id number]   Subjective: This patient presents today requesting debridement of pre-ulcerative plantar callus on the left foot that she denies any active drainage or warmth in the area she also has an approximately two-month history of a local swelling at the posterior nail fold of right hallux has not progressed over the past 2 months as far as swelling or redness. Patient denies any treatment for this problem Patient is a known diabetic with peripheral neuropathy Today patient is also requesting a replacement diabetic shoes with custom insoles  Objective: Orientated 3   Vascular: DP pulses 1/4 bilaterally PT pulses 2/4 bilaterally Capillary reflex immediate bilaterally  Neurological: Sensation to 10 g monofilament wire intact 0/5 bilaterally Vibratory sensation nonreactive bilaterally Ankle reflexes reactive bilaterally  Dermatological: No open skin lesions bilaterally Atrophic skin bilaterally Bleeding callus sub-left first MPJ that remains closed after debridement, large buildup of hyperkeratotic tissue callus sub-first MPJ right Some occasional hypertrophic toenails right and left Local erythema and edema posterior right hallux nail fold. 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

## 2017-03-31 NOTE — Patient Instructions (Addendum)
Take the oral antibiotics 1 capsule 3 times a day 7 days for the swelling and redness around the right great toenail  Diabetes and Foot Care Diabetes may cause you to have problems because of poor blood supply (circulation) to your feet and legs. This may cause the skin on your feet to become thinner, break easier, and heal more slowly. Your skin may become dry, and the skin may peel and crack. You may also have nerve damage in your legs and feet causing decreased feeling in them. You may not notice minor injuries to your feet that could lead to infections or more serious problems. Taking care of your feet is one of the most important things you can do for yourself. Follow these instructions at home:  Wear shoes at all times, even in the house. Do not go barefoot. Bare feet are easily injured.  Check your feet daily for blisters, cuts, and redness. If you cannot see the bottom of your feet, use a mirror or ask someone for help.  Wash your feet with warm water (do not use hot water) and mild soap. Then pat your feet and the areas between your toes until they are completely dry. Do not soak your feet as this can dry your skin.  Apply a moisturizing lotion or petroleum jelly (that does not contain alcohol and is unscented) to the skin on your feet and to dry, brittle toenails. Do not apply lotion between your toes.  Trim your toenails straight across. Do not dig under them or around the cuticle. File the edges of your nails with an emery board or nail file.  Do not cut corns or calluses or try to remove them with medicine.  Wear clean socks or stockings every day. Make sure they are not too tight. Do not wear knee-high stockings since they may decrease blood flow to your legs.  Wear shoes that fit properly and have enough cushioning. To break in new shoes, wear them for just a few hours a day. This prevents you from injuring your feet. Always look in your shoes before you put them on to be sure  there are no objects inside.  Do not cross your legs. This may decrease the blood flow to your feet.  If you find a minor scrape, cut, or break in the skin on your feet, keep it and the skin around it clean and dry. These areas may be cleansed with mild soap and water. Do not cleanse the area with peroxide, alcohol, or iodine.  When you remove an adhesive bandage, be sure not to damage the skin around it.  If you have a wound, look at it several times a day to make sure it is healing.  Do not use heating pads or hot water bottles. They may burn your skin. If you have lost feeling in your feet or legs, you may not know it is happening until it is too late.  Make sure your health care provider performs a complete foot exam at least annually or more often if you have foot problems. Report any cuts, sores, or bruises to your health care provider immediately. Contact a health care provider if:  You have an injury that is not healing.  You have cuts or breaks in the skin.  You have an ingrown nail.  You notice redness on your legs or feet.  You feel burning or tingling in your legs or feet.  You have pain or cramps in your legs and  feet.  Your legs or feet are numb.  Your feet always feel cold. Get help right away if:  There is increasing redness, swelling, or pain in or around a wound.  There is a red line that goes up your leg.  Pus is coming from a wound.  You develop a fever or as directed by your health care provider.  You notice a bad smell coming from an ulcer or wound. This information is not intended to replace advice given to you by your health care provider. Make sure you discuss any questions you have with your health care provider. Document Released: 11/13/2000 Document Revised: 04/23/2016 Document Reviewed: 04/25/2013 Elsevier Interactive Patient Education  2017 [employer].

## 2017-04-02 ENCOUNTER — Other Ambulatory Visit: Payer: Self-pay | Admitting: Oncology

## 2017-04-05 MED FILL — TASIGNA 150 MG CAPSULE: 150 | 28 days supply | Qty: 112 | Fill #0

## 2017-04-07 ENCOUNTER — Ambulatory Visit: Payer: PPO | Admitting: *Deleted

## 2017-04-08 NOTE — Progress Notes (Unsigned)
Patient came in today for measurement/fitting for diabetic shoes and custom inserts.  When asked about who was managing her DM2 care, she advised that a PA was in charge.   When I told her that a PA cannot sign off on the medicare necessitated documents, she became irate and claimed she has gotten shoes from [facility] before and there wasn't an issue.    I called [employer] in front of her and they verified that a PA, FNP could not sign off on shoes.Marland KitchenMarland KitchenShe appealed to [place].   I advised Darrick Penna also of the policy and stated that we must stay in Medicare compliance in order to avoid unnecessary audits.  Patient left upset.

## 2017-04-27 DIAGNOSIS — F419 Anxiety disorder, unspecified: Secondary | ICD-10-CM | POA: Diagnosis not present

## 2017-04-27 DIAGNOSIS — L84 Corns and callosities: Secondary | ICD-10-CM | POA: Diagnosis not present

## 2017-04-27 DIAGNOSIS — E890 Postprocedural hypothyroidism: Secondary | ICD-10-CM | POA: Diagnosis not present

## 2017-04-27 DIAGNOSIS — E119 Type 2 diabetes mellitus without complications: Secondary | ICD-10-CM | POA: Diagnosis not present

## 2017-04-27 DIAGNOSIS — I4891 Unspecified atrial fibrillation: Secondary | ICD-10-CM | POA: Diagnosis not present

## 2017-04-27 DIAGNOSIS — I10 Essential (primary) hypertension: Secondary | ICD-10-CM | POA: Diagnosis not present

## 2017-04-27 DIAGNOSIS — K2950 Unspecified chronic gastritis without bleeding: Secondary | ICD-10-CM | POA: Diagnosis not present

## 2017-04-27 DIAGNOSIS — Z79899 Other long term (current) drug therapy: Secondary | ICD-10-CM | POA: Diagnosis not present

## 2017-04-30 ENCOUNTER — Other Ambulatory Visit: Payer: Self-pay | Admitting: Oncology

## 2017-04-30 MED FILL — TASIGNA 150 MG CAPSULE: 150 | 28 days supply | Qty: 112 | Fill #0

## 2017-05-24 ENCOUNTER — Other Ambulatory Visit: Payer: Self-pay | Admitting: Oncology

## 2017-05-24 MED FILL — TASIGNA 150 MG CAPSULE: 150 | 28 days supply | Qty: 112 | Fill #0

## 2017-06-08 ENCOUNTER — Ambulatory Visit (INDEPENDENT_AMBULATORY_CARE_PROVIDER_SITE_OTHER): Payer: PPO | Admitting: Cardiovascular Disease

## 2017-06-08 ENCOUNTER — Encounter: Payer: Self-pay | Admitting: Cardiovascular Disease

## 2017-06-08 VITALS — BP 174/72 | HR 83 | Ht 68.0 in | Wt 201.6 lb

## 2017-06-08 DIAGNOSIS — I482 Chronic atrial fibrillation, unspecified: Secondary | ICD-10-CM

## 2017-06-08 DIAGNOSIS — I272 Pulmonary hypertension, unspecified: Secondary | ICD-10-CM | POA: Diagnosis not present

## 2017-06-08 DIAGNOSIS — Z79899 Other long term (current) drug therapy: Secondary | ICD-10-CM

## 2017-06-08 DIAGNOSIS — C9210 Chronic myeloid leukemia, BCR/ABL-positive, not having achieved remission: Secondary | ICD-10-CM

## 2017-06-08 DIAGNOSIS — I428 Other cardiomyopathies: Secondary | ICD-10-CM

## 2017-06-08 DIAGNOSIS — E118 Type 2 diabetes mellitus with unspecified complications: Secondary | ICD-10-CM | POA: Diagnosis not present

## 2017-06-08 DIAGNOSIS — R400 Somnolence: Secondary | ICD-10-CM

## 2017-06-08 MED ORDER — SPIRONOLACTONE 25 MG PO TABS: 12.5000 mg | ORAL_TABLET | Freq: Every day | ORAL | 3 refills | Status: DC

## 2017-06-08 NOTE — Progress Notes (Signed)
Patient ID: Sara Warner, female   DOB: 01-05-1941, 76 y.o.   MRN: [id number]     HPI: Sara Warner is a 76 year old female who presents to the office today for an 8 month followup cardiology evaluation.   Sara Warner is a former patient of Dr. Doreatha Lew. She has a history of permanent atrial fibrillation and admits to being in atrial fibrillation for >18 years. She had problems with recurrent epistaxis in the past on Coumadin and consequently has not been on anticoagulation therapy. An echo Doppler study in 2008 showed an ejection fraction of 30-35%. A Myoview in February 2009 showed no ischemia but mild global reduction in LV function with an ejection fraction of 47%. An echo in 2010  showed low normal LV function with mild LA enlargement and mild mitral regurgitation and tricuspid regurgitation.  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 She does also have issues with thyroid abnormality. She has varicose veins and notes right lower extremity swelling.   When  I initially saw her in 2014, we discussed potential thromboembolic risk associated with atrial fibrillation. She has not been on any coagulation due to frequent nosebleeds requiring cauterization and does have CML. She did have a significant remote tobacco history of 2 packs per day for over 20 years. She had nonspecific ST changes inferolaterally on her EKG. A nuclear perfusion scan  on 07/05/2013 was not gated to her atrial fibrillation and was a low risk study demonstrating mild artifact. There was no scar or ischemia. A 2-D echo Doppler study showed normal systolic function and Doppler parameters suggested increased mean left atrial pressure. She is systolic bowing of her mitral valve leaflet without definitive prolapse with mild MR. Her left atrium was moderate to severely dilated and her right atrium was moderately dilated. There was mild pulmonary hypertension with PA pressure estimated at 34 mm.   A  follow-up echo Doppler study on  05/30/2015 revealed an ejection fraction of 35-40%.  There was diffuse hypokinesis with suggestion of akinesis involving the basal to mid inferoseptal wall.  There was aortic valve sclerosis without stenosis, and mild-to-moderate mitral and tricuspid regurgitation.  Her left atrium was moderately dilated.  There was mild pulmonary hypertension with estimated PA pressure 37 mm.  She has been on rate control for atrial fibrillation and most recently is on digoxin 0.125 mg daily and Toprol-XL 50 mg twice a day.  Remotely, she had developed significant lower extremity edema on verapamil. She is diabetic on Glucotrol XL.  She has a history of hypothyroidism on Synthroid 125 g.  GERD on Prilosec.  There also is a remote history of microcytic indices.  She is on the Tasigna for her CML.  Since I last saw her, she continues to experience weakness and shortness of breath.  She often has to sleep in a recliner after she wakes up short of breath.  She wakes up at least 3-4 times per night for nocturia.  She denies associated chest pressure.  She has a history of kidney stones.  She has a history of renal insufficiency.  She presents for cardiology follow-up evaluation.  Past Medical History:  Diagnosis Date  . Atrial fibrillation ([place])   . Chronic atrial fibrillation ([place])    a. Refuses [place].  CHA2DS2VASc = 4.  . Chronic systolic CHF (congestive heart failure) ([place])    a. 05/2015 Echo: EF 35-40%.  . CKD (chronic kidney disease), stage III   . CML (chronic myelocytic leukemia) ([place]) 01/10/2013  . DM (diabetes  mellitus) ([place])   . Elevated WBC count   . Hypertensive heart disease   . Hypothyroidism   . NICM (nonischemic cardiomyopathy) ([place])    a. 06/2013 MV: low risk study w/o ischemia;  b. 05/2015 Echo: EF 35-40%, diff HK, basal-midinferoseptal and basal-midanteroseptal AK. Mild-mod MR, mod dil LA, mild to mod TR, PASP 109mHg.  .Marland KitchenToxic goiter     Past Surgical History:  Procedure Laterality Date  .  CARDIOVERSION    . CYSTECTOMY    . HYSTEROTOMY      Allergies  Allergen Reactions  . Losartan Other (See Comments)    Patient reports nightmares/vivid dreams when taking.  . Lisinopril Cough    Current Outpatient Prescriptions  Medication Sig Dispense Refill  . aspirin EC 81 MG tablet Take 1 tablet (81 mg total) by mouth daily. 90 tablet 3  . clorazepate (TRANXENE-T) 7.5 MG tablet Take 1 tablet (7.5 mg total) by mouth 2 (two) times daily as needed for anxiety. 30 tablet 1  . digoxin (LANOXIN) 0.125 MG tablet Take 0.125 mg by mouth 2 (two) times a week. Takes on Monday, and friday only    . furosemide (LASIX) 20 MG tablet Take 2 tablets in the morning ( 40 mg). Take 1 tablet (279m as needed for 2 lb gain over 24 hours. 90 tablet 6  . glipiZIDE (GLUCOTROL XL) 10 MG 24 hr tablet Take 10 mg by mouth 2 (two) times daily.    . metFORMIN (GLUCOPHAGE-XR) 500 MG 24 hr tablet Take 1 tablet by mouth 2 (two) times daily.    . metoprolol (TOPROL XL) 200 MG 24 hr tablet Take 1 tablet (200 mg total) by mouth daily. 30 tablet 6  . potassium chloride SA (K-DUR,KLOR-CON) 20 MEQ tablet Take 20 mEq by mouth daily.    . sitaGLIPtin (JANUVIA) 50 MG tablet Take 50 mg by mouth.     . SYNTHROID 150 MCG tablet Take 150 mcg by mouth daily.  1  . TASIGNA 150 MG capsule TAKE 2 CAPSULES BY MOUTH EVERY 12 HOURS 112 capsule 0  . spironolactone (ALDACTONE) 25 MG tablet Take 0.5 tablets (12.5 mg total) by mouth daily. At 5:00 pm 30 tablet 3   No current facility-administered medications for this visit.     Socially she is widowed for 24 years. She has 5 children 15 grandchildren and 17 great grandchildren. She lives at home with her 2 grandchildren. She is retired. She completed ninth grade of education. She is a former smoker but quit smoking in 1987 there prior to that she had smoked up to 2 packs per day for 20 years. There is no alcohol use.  Family History  Problem Relation Age of Onset  . Cancer - Colon  Mother        mast to lungs  . Diabetes Father   . Diabetes Paternal Grandmother     ROS General: Negative; No fevers, chills, or night sweats;  HEENT: Negative; No changes in vision or hearing, sinus congestion, difficulty swallowing Pulmonary: Negative; No cough, wheezing, shortness of breath, hemoptysis Cardiovascular: Negative; No chest pain, presyncope, syncope, palpitations Occasional leg swelling GI: Negative; No nausea, vomiting, diarrhea, or abdominal pain GU: Negative; No dysuria, hematuria, or difficulty voiding Musculoskeletal: Negative; no myalgias, joint pain, or weakness Hematologic/Oncology: Positive for CML Endocrine: Positive for hypothyroidism and diabetes Neuro: Negative; no changes in balance, headaches Skin: Negative; No rashes or skin lesions Psychiatric: Negative; No behavioral problems, depression Sleep: Poor sleep; frequent nocturia;  daytime sleepiness,  hypersomnolence, bruxism, restless legs, hypnogognic hallucinations, no cataplexy Other comprehensive 14 point system review is negative.  PE BP (!) 174/72   Pulse 83   Ht _0  (1.727 m)   Wt 201 lb 9.6 oz (91.4 kg)   BMI 30.65 kg/m    Repeat blood pressure by me 158/72  Wt Readings from Last 3 Encounters:  06/08/17 201 lb 9.6 oz (91.4 kg)  03/17/17 199 lb 11.2 oz (90.6 kg)  03/11/17 199 lb 9.6 oz (90.5 kg)    General: Alert, oriented, no distress.  Skin: normal turgor, no rashes, warm and dry HEENT: Normocephalic, atraumatic. Pupils equal round and reactive to light; sclera anicteric; extraocular muscles intact;  Nose without nasal septal hypertrophy Mouth/Parynx benign; Mallinpatti scale 3 Neck: No JVD, no carotid bruits; normal carotid upstroke Lungs: clear to ausculatation and percussion; no wheezing or rales Chest wall: without tenderness to palpitation Heart: Atrial fibrillation with ventricular rate in the 80s, s1 s2 normal, 1/6 systolic murmur, no diastolic murmur, no rubs, gallops,  thrills, or heaves Abdomen: soft, nontender; no hepatosplenomehaly, BS+; abdominal aorta nontender and not dilated by palpation. Back: no CVA tenderness Pulses 2+ Musculoskeletal: full range of motion, normal strength, no joint deformities Extremities: Mild varicosities, trace lower extremity edema, right greater than left no clubbing cyanosis , Homan's sign negative  Neurologic: grossly nonfocal; Cranial nerves grossly wnl Psychologic: Normal mood and affect   ECG (independently read by me): Atrial fibrillation at 83 bpm.  Nonspecific ST changes.  Normal intervals.  December 2017 ECG (independently read by me): Atrial fibrillation with ventricular rate at 90 bpm.  LVH by voltage.  Nonspecific ST changes.  February 2017 ECG (independently read by me):  Atrial fibrillation with ventricular rate at 78 bpm.  Nonspecific ST changes, probably secondary to digoxin effect. Normal QTc interval.  November 2016 ECG (independently read by me): Atrial fibrillation with a ventricular rate in the 90s.  LVH by voltage.  Inferolateral ST-T changes possibly contributed by digoxin.  August 2016 ECG (independently read by me): Atrial fibrillation with rate in the 80s.  Inferolateral ST-T changes.  QTc interval 420 ms.  June 2016 ECG (independently read by me): Atrial fibrillation with rapid ventricular response with a rate December 2017 at 1 21 bpm.  Nonspecific inferolateral ST segment changes.  LABS:  BMP Latest Ref Rng & Units 03/17/2017 02/25/2017 02/18/2017  Glucose 70 - 140 mg/dl 174(H) 67 84  BUN 7.0 - 26.0 mg/dL 26.1(H) 30(H) 37(H)  Creatinine 0.6 - 1.1 mg/dL 1.5(H) 1.42(H) 2.17(H)  BUN/Creat Ratio 12 - 28 - 21 17  Sodium 136 - 145 mEq/L 140 142 143  Potassium 3.5 - 5.1 mEq/L 4.7 4.3 5.0  Chloride 96 - 106 mmol/L - 102 100  CO2 22 - 29 mEq/L _1 Calcium 8.4 - 10.4 mg/dL 9.5 9.1 10.0   Hepatic Function Latest Ref Rng & Units 03/17/2017 09/16/2016 03/18/2016  Total Protein 6.4 - 8.3 g/dL 7.1  6.8 6.7  Albumin 3.5 - 5.0 g/dL 4.4 3.9 4.0  AST 5 - 34 U/L _2 ALT 0 - 55 U/L _3 Alk Phosphatase 40 - 150 U/L 48 58 44  Total Bilirubin 0.20 - 1.20 mg/dL 1.35(H) 1.09 1.02   CBC Latest Ref Rng & Units 03/17/2017 09/16/2016 03/18/2016  WBC 3.9 - 10.3 10e3/uL 10.4(H) 10.6(H) 10.5(H)  Hemoglobin 11.6 - 15.9 g/dL 13.0 12.9 13.6  Hematocrit 34.8 - 46.6 % 41.4 39.6 41.9  Platelets 145 - 400  10e3/uL 326 353 310   Lab Results  Component Value Date   MCV 85.0 03/17/2017   MCV 84.1 09/16/2016   MCV 87.2 03/18/2016   Lab Results  Component Value Date   TSH 1.932 05/30/2015  No results found for: HGBA1C   Lipid Panel     Component Value Date/Time   CHOL 128 05/30/2015 0932   TRIG 110 05/30/2015 0932   HDL 27 (L) 05/30/2015 0932   CHOLHDL 4.7 05/30/2015 0932   VLDL 22 05/30/2015 0932   LDLCALC 79 05/30/2015 0932    RADIOLOGY: No results found.   IMPRESSION:  1. Chronic atrial fibrillation ([place])   2. Daytime somnolence   3. Medication management   4. NICM (nonischemic cardiomyopathy) ([place])   5. Mild pulmonary hypertension ([place])   6. Type 2 diabetes mellitus with complication, without long-term current use of insulin ([facility])   7. CML (chronic myelocytic leukemia) ([place])      ASSESSMENT AND PLAN: Sara Warner is a 76 year old female who has a history of permanent atrial fibrillation for almost 20 years. She has not been on anticoagulation due to history of frequent nosebleeds in the past requiring cauterization.   She is aware of potential for CVA risk with atrial fibrillation compounding over the years.  She still prefers against initiating anticoagulation, whether with warfarin or with the newer agents.  She is on aspirin.  He recently was found to have blood in her urine and was diagnosed as having a renal stone.  She was diagnosed with CML and is undergoing treatment.  Her blood pressure today continues to be elevated despite taking Toprol-XL 200 mg daily in addition  to her furosemide 40 mg in the morning and she takes 20 mg as needed.  She has type 2 diabetes mellitus and continues to be on Januvia, metformin in addition to glipizide.  She has a history of hypothyroidism on Synthroid replacement.  She currently is receiving Tasigna for CML.  I am adding spironolactone 12.5 mg, which will be helpful for her blood pressure control, particularly with her previously noted reduced LV function.   I am concerned that she may have obstructive sleep apnea which is contributing to some of her difficulty  lying flat and frequent nocturia.  I have recommended a sleep study for further evaluation.  She has chronic atrial fibrillation of almost 2 decades duration.  I will see her back in the office in 3-4 months for reevaluation. Time spent: 25 minutes  Troy Sine, MD, [facility] 06/10/2017 10:42 PM

## 2017-06-08 NOTE — Patient Instructions (Signed)
Your physician has recommended you make the following change in your medication:   1.) start new prescription for spironolactone as directed on the bottle.  Your physician recommends that you return for lab work in: 2-3 weeks here in our office.  Your physician has recommended that you have a sleep study. This test records several body functions during sleep, including: brain activity, eye movement, oxygen and carbon dioxide blood levels, heart rate and rhythm, breathing rate and rhythm, the flow of air through your mouth and nose, snoring, body muscle movements, and chest and belly movement. This will be done at [facility].  Your physician recommends that you schedule a follow-up appointment in: 4 months with Dr Claiborne Billings.

## 2017-06-16 ENCOUNTER — Other Ambulatory Visit: Payer: Self-pay | Admitting: Oncology

## 2017-06-17 ENCOUNTER — Telehealth: Payer: Self-pay | Admitting: Pharmacy Technician

## 2017-06-17 MED FILL — TASIGNA 150 MG CAPSULE: 150 | 28 days supply | Qty: 112 | Fill #0

## 2017-06-17 NOTE — Telephone Encounter (Signed)
Oral Oncology Patient Advocate Encounter  Received notification from [name] that the patient's copay assistance grant was out of funds to help her pay for her Tasigna.    We were able to provide her with a voucher from the manufacturer that will cover 3 months of drug at no charge.   Today, I met patient in [facility] lobby to complete application for [name] in an effort to keep the patient's out of pocket expense for Tasigna to $0.     Application completed and faxed to [phone].   [employer] patient assistance phone number for follow up is [phone].   In the meantime, I will continue to monitor for [facility] copay assistance grant funding to open.   This encounter will be updated until final determination.   [name]. [name], [name] [phone] 06/17/2017 12:20 PM

## 2017-06-22 DIAGNOSIS — H6121 Impacted cerumen, right ear: Secondary | ICD-10-CM | POA: Diagnosis not present

## 2017-06-22 DIAGNOSIS — H9311 Tinnitus, right ear: Secondary | ICD-10-CM | POA: Diagnosis not present

## 2017-06-22 DIAGNOSIS — Z87891 Personal history of nicotine dependence: Secondary | ICD-10-CM | POA: Diagnosis not present

## 2017-06-23 DIAGNOSIS — H6123 Impacted cerumen, bilateral: Secondary | ICD-10-CM | POA: Insufficient documentation

## 2017-06-23 NOTE — Telephone Encounter (Signed)
Oral Oncology Patient Advocate Encounter  Received notification from [employer] Patient Assistance program that patient has been successfully enrolled into their program to receive Tasigna from the manufacturer at $0 out of pocket until 11-29-2017.   I called and left a message for the patient with the good news.    The free voucher has 1 fill remaining which will be dispensed from [facility] in August.  After this, her medication will be supplied by [employer].    I asked the patient to call the office should she have any additional questions or concerns,.    Oral Oncology Clinic will continue to follow.  Gilmore Laroche, CPhT, [place] Oral Oncology Patient Advocate [phone] 06/23/2017 2:02 PM

## 2017-06-30 DIAGNOSIS — I482 Chronic atrial fibrillation: Secondary | ICD-10-CM | POA: Diagnosis not present

## 2017-06-30 DIAGNOSIS — Z79899 Other long term (current) drug therapy: Secondary | ICD-10-CM | POA: Diagnosis not present

## 2017-07-01 LAB — BASIC METABOLIC PANEL
BUN / CREAT RATIO: 23 (ref 12–28)
BUN: 35 mg/dL — ABNORMAL HIGH (ref 8–27)
CO2: 22 mmol/L (ref 20–29)
Calcium: 10.1 mg/dL (ref 8.7–10.3)
Chloride: 101 mmol/L (ref 96–106)
Creatinine, Ser: 1.49 mg/dL — ABNORMAL HIGH (ref 0.57–1.00)
GFR calc Af Amer: 39 mL/min/{1.73_m2} — ABNORMAL LOW (ref >59)
GFR calc non Af Amer: 34 mL/min/{1.73_m2} — ABNORMAL LOW (ref >59)
GLUCOSE: 136 mg/dL — AB (ref 65–99)
POTASSIUM: 4.9 mmol/L (ref 3.5–5.2)
SODIUM: 141 mmol/L (ref 134–144)

## 2017-07-06 ENCOUNTER — Telehealth: Payer: Self-pay | Admitting: Pharmacist

## 2017-07-06 ENCOUNTER — Ambulatory Visit: Payer: PPO | Admitting: Podiatry

## 2017-07-06 NOTE — Telephone Encounter (Signed)
Oral Chemotherapy Pharmacist Encounter  Follow-Up Form  Called patient today to follow up regarding patient's oral chemotherapy medication: Tasigna for the treatment chronic myeloid leukemia until disease progression or unacceptable toxicvity  Original Start date of oral chemotherapy: 12/08/12  Pt is doing well today  Pt reports 0 tablets/doses of Tasigna 150mg  capsules, 2 capsules (300mg ) by mouth 2 times daily, approximately 12 hours apart, on an empty stomach, 1 hour before or 2 hours after a meal missed in the last month.   Pt reports the following side effects: none to report  Pertinent labs reviewed: OK for continued treatment.  Other Issues: none  Patient states she has not yet heard from [employer] to schedule shipment of her 1st fill from their patient assistance. [employer] phone number [phone]) provided to patient for follow-up on this. Patient stated she woold call them as soon as we got off our call.  Patient knows to call the office with questions or concerns. Oral Oncology Clinic will continue to follow.  Thank you,  Johny Drilling, PharmD, BCPS, BCOP 07/06/2017 12:24 PM Oral Oncology Clinic [phone]

## 2017-07-15 ENCOUNTER — Other Ambulatory Visit: Payer: Self-pay | Admitting: Oncology

## 2017-07-15 MED FILL — TASIGNA 150 MG CAPSULE: 150 | 28 days supply | Qty: 112 | Fill #0

## 2017-07-29 ENCOUNTER — Other Ambulatory Visit: Payer: Self-pay | Admitting: Pharmacist

## 2017-07-29 DIAGNOSIS — C9210 Chronic myeloid leukemia, BCR/ABL-positive, not having achieved remission: Secondary | ICD-10-CM

## 2017-07-29 MED ORDER — NILOTINIB HCL 150 MG PO CAPS: 300.0000 mg | ORAL_CAPSULE | Freq: Two times a day (BID) | ORAL | 0 refills | Status: DC

## 2017-08-03 ENCOUNTER — Encounter: Payer: Self-pay | Admitting: *Deleted

## 2017-08-03 ENCOUNTER — Other Ambulatory Visit: Payer: Self-pay | Admitting: *Deleted

## 2017-08-03 DIAGNOSIS — C9210 Chronic myeloid leukemia, BCR/ABL-positive, not having achieved remission: Secondary | ICD-10-CM

## 2017-08-03 MED ORDER — NILOTINIB HCL 150 MG PO CAPS
300.0000 mg | ORAL_CAPSULE | Freq: Two times a day (BID) | ORAL | 0 refills | Status: DC
Start: 2017-08-03 — End: 2017-10-07

## 2017-08-03 NOTE — Telephone Encounter (Signed)
tasigna script faxed to [employer] patient assistance [employer] [phone]

## 2017-08-09 ENCOUNTER — Encounter (HOSPITAL_BASED_OUTPATIENT_CLINIC_OR_DEPARTMENT_OTHER): Payer: PPO

## 2017-08-17 DIAGNOSIS — S81811A Laceration without foreign body, right lower leg, initial encounter: Secondary | ICD-10-CM | POA: Diagnosis not present

## 2017-08-17 DIAGNOSIS — L03115 Cellulitis of right lower limb: Secondary | ICD-10-CM | POA: Diagnosis not present

## 2017-08-17 DIAGNOSIS — Z23 Encounter for immunization: Secondary | ICD-10-CM | POA: Diagnosis not present

## 2017-09-01 ENCOUNTER — Other Ambulatory Visit: Payer: Self-pay | Admitting: Nurse Practitioner

## 2017-09-03 DIAGNOSIS — R21 Rash and other nonspecific skin eruption: Secondary | ICD-10-CM | POA: Diagnosis not present

## 2017-09-03 DIAGNOSIS — L299 Pruritus, unspecified: Secondary | ICD-10-CM | POA: Diagnosis not present

## 2017-09-08 ENCOUNTER — Telehealth: Payer: Self-pay | Admitting: Oncology

## 2017-09-08 ENCOUNTER — Other Ambulatory Visit (HOSPITAL_BASED_OUTPATIENT_CLINIC_OR_DEPARTMENT_OTHER): Payer: PPO

## 2017-09-08 ENCOUNTER — Ambulatory Visit (HOSPITAL_BASED_OUTPATIENT_CLINIC_OR_DEPARTMENT_OTHER): Payer: PPO | Admitting: Oncology

## 2017-09-08 VITALS — BP 147/69 | HR 109 | Temp 98.3°F | Resp 18 | Ht 68.0 in | Wt 195.8 lb

## 2017-09-08 DIAGNOSIS — C9211 Chronic myeloid leukemia, BCR/ABL-positive, in remission: Secondary | ICD-10-CM | POA: Diagnosis not present

## 2017-09-08 DIAGNOSIS — E119 Type 2 diabetes mellitus without complications: Secondary | ICD-10-CM

## 2017-09-08 DIAGNOSIS — C9210 Chronic myeloid leukemia, BCR/ABL-positive, not having achieved remission: Secondary | ICD-10-CM | POA: Diagnosis not present

## 2017-09-08 DIAGNOSIS — I429 Cardiomyopathy, unspecified: Secondary | ICD-10-CM | POA: Diagnosis not present

## 2017-09-08 LAB — COMPREHENSIVE METABOLIC PANEL
ALT: 23 U/L (ref 0–55)
ANION GAP: 11 meq/L (ref 3–11)
AST: 19 U/L (ref 5–34)
Albumin: 4.2 g/dL (ref 3.5–5.0)
Alkaline Phosphatase: 44 U/L (ref 40–150)
BUN: 26.9 mg/dL — ABNORMAL HIGH (ref 7.0–26.0)
CHLORIDE: 105 meq/L (ref 98–109)
CO2: 24 mEq/L (ref 22–29)
Calcium: 9.5 mg/dL (ref 8.4–10.4)
Creatinine: 1.4 mg/dL — ABNORMAL HIGH (ref 0.6–1.1)
EGFR: 38 mL/min/{1.73_m2} — AB (ref >60)
Glucose: 143 mg/dl — ABNORMAL HIGH (ref 70–140)
POTASSIUM: 4 meq/L (ref 3.5–5.1)
Sodium: 140 mEq/L (ref 136–145)
Total Bilirubin: 0.65 mg/dL (ref 0.20–1.20)
Total Protein: 7 g/dL (ref 6.4–8.3)

## 2017-09-08 LAB — CBC WITH DIFFERENTIAL/PLATELET
BASO%: 1.2 % (ref 0.0–2.0)
BASOS ABS: 0.1 10*3/uL (ref 0.0–0.1)
EOS ABS: 0.2 10*3/uL (ref 0.0–0.5)
EOS%: 2 % (ref 0.0–7.0)
HCT: 41 % (ref 34.8–46.6)
HGB: 13.2 g/dL (ref 11.6–15.9)
LYMPH%: 31.5 % (ref 14.0–49.7)
MCH: 27.8 pg (ref 25.1–34.0)
MCHC: 32.2 g/dL (ref 31.5–36.0)
MCV: 86.2 fL (ref 79.5–101.0)
MONO#: 0.7 10*3/uL (ref 0.1–0.9)
MONO%: 5.9 % (ref 0.0–14.0)
NEUT#: 6.7 10*3/uL — ABNORMAL HIGH (ref 1.5–6.5)
NEUT%: 59.4 % (ref 38.4–76.8)
PLATELETS: 350 10*3/uL (ref 145–400)
RBC: 4.76 10*6/uL (ref 3.70–5.45)
RDW: 15.9 % — ABNORMAL HIGH (ref 11.2–14.5)
WBC: 11.3 10*3/uL — ABNORMAL HIGH (ref 3.9–10.3)
lymph#: 3.6 10*3/uL — ABNORMAL HIGH (ref 0.9–3.3)

## 2017-09-08 NOTE — Progress Notes (Signed)
Hematology and Oncology Follow Up Visit  Sara Warner [id number] 24-Jun-1941 76 y.o. 09/08/2017 2:42 PM   Principle Diagnosis: 76 year old woman with the leukocytosis and found to have chronic phase CML. Diagnosed a bone marrow biopsy on 10/24/2012.   Current treatment: Tasigna 300 mg bid started 11/2012 she achieved complete hematological and molecular remission.  Interim History: Sara Warner presents today for a follow up visit by her self. Since her last visit, she reports feeling well without any issues. She was diagnosed with cellulitis and was treated with antibiotics which caused her a to have a skin rash. His symptoms have resolved at this time.   She takes Tasigna without any new complications. She denied any hypoglycemia or any thrombotic events. She denied any GI side effects. She denied any constitutional symptoms of weight loss. She denied any lymphadenopathy. She remains active and attends to the health of her elderly father.  She is not report any headaches, blurry vision, syncope or seizures. She  does not report any fevers, does not report any chills, does not report any lymphadenopathy. She does not report any back pain or bony pain. She has not reported any recent hospitalizations. She does not report any chest pain but does report occasional palpitations. She does not report any dyspnea on exertion or leg edema. She does not report any cough or hemoptysis. He does not report any nausea, vomiting, abdominal pain. She does not report any hematochezia or melena. The remaining review of systems unremarkable.   Medications: I have reviewed the patient's current medications. Current Outpatient Prescriptions  Medication Sig Dispense Refill  . aspirin EC 81 MG tablet Take 1 tablet (81 mg total) by mouth daily. 90 tablet 3  . clorazepate (TRANXENE-T) 7.5 MG tablet Take 1 tablet (7.5 mg total) by mouth 2 (two) times daily as needed for anxiety. 30 tablet 1  . digoxin (LANOXIN)  0.125 MG tablet Take 0.125 mg by mouth 2 (two) times a week. Takes on Monday, and friday only    . furosemide (LASIX) 20 MG tablet Take 2 tablets in the morning ( 40 mg). Take 1 tablet (5m) as needed for 2 lb gain over 24 hours. 90 tablet 6  . furosemide (LASIX) 20 MG tablet TAKE 2 TABLETS IN THE MORNING AND 1 TABLET IN THE EVENING 90 tablet 6  . glipiZIDE (GLUCOTROL XL) 10 MG 24 hr tablet Take 10 mg by mouth 2 (two) times daily.    . metFORMIN (GLUCOPHAGE-XR) 500 MG 24 hr tablet Take 1 tablet by mouth 2 (two) times daily.    . metoprolol (TOPROL-XL) 200 MG 24 hr tablet TAKE 1 TABLET BY MOUTH EVERY DAY 30 tablet 6  . nilotinib (TASIGNA) 150 MG capsule Take 2 capsules (300 mg total) by mouth every 12 (twelve) hours. Take on an empty stomach, 1 hour before or 2 hours after meals. 112 capsule 0  . potassium chloride SA (K-DUR,KLOR-CON) 20 MEQ tablet Take 20 mEq by mouth daily.    . sitaGLIPtin (JANUVIA) 50 MG tablet Take 50 mg by mouth.     . spironolactone (ALDACTONE) 25 MG tablet Take 0.5 tablets (12.5 mg total) by mouth daily. At 5:00 pm 30 tablet 3  . SYNTHROID 150 MCG tablet Take 150 mcg by mouth daily.  1   No current facility-administered medications for this visit.     Allergies:  Allergies  Allergen Reactions  . Losartan Other (See Comments)    Patient reports nightmares/vivid dreams when taking.  . Lisinopril Cough  Past Medical History, Surgical history, Social history, and Family History were reviewed and updated.   Physical Exam: Blood pressure (!) 147/69, pulse (!) 109, temperature 98.3 F (36.8 C), temperature source Oral, resp. rate 18, height _0  (1.727 m), weight 195 lb 12.8 oz (88.8 kg), SpO2 98 %. ECOG: 1 General appearance: Well-appearing woman without distress. Head: Normocephalic, without obvious abnormality no oral ulcers or lesions. Neck: no adenopathy Lymph nodes: Cervical, supraclavicular, and axillary nodes normal. Heart:irregularly irregular rhythm no  murmurs. Lung:chest clear, no wheezing, rales, normal symmetric air entry. No dullness to percussion. Abdomen: soft, non-tender, without masses or organomegaly no rebound or guarding. EXT:no erythema, induration, or nodules Skin: No rashes or lesions.  Lab Results: Lab Results  Component Value Date   WBC 11.3 (H) 09/08/2017   HGB 13.2 09/08/2017   HCT 41.0 09/08/2017   MCV 86.2 09/08/2017   PLT 350 09/08/2017     Chemistry      Component Value Date/Time   NA 141 06/30/2017 1301   NA 140 03/17/2017 0907   K 4.9 06/30/2017 1301   K 4.7 03/17/2017 0907   CL 101 06/30/2017 1301   CL 106 04/26/2013 1252   CO2 22 06/30/2017 1301   CO2 26 03/17/2017 0907   BUN 35 (H) 06/30/2017 1301   BUN 26.1 (H) 03/17/2017 0907   CREATININE 1.49 (H) 06/30/2017 1301   CREATININE 1.5 (H) 03/17/2017 0907      Component Value Date/Time   CALCIUM 10.1 06/30/2017 1301   CALCIUM 9.5 03/17/2017 0907   ALKPHOS 48 03/17/2017 0907   AST 19 03/17/2017 0907   ALT 25 03/17/2017 0907   BILITOT 1.35 (H) 03/17/2017 0907         Impression and Plan:   76 year old woman with the following issues:  1. CML.  Chronic phase at this time. On Tasigna since 11/2012 with normalization of her her WBC. She is tolerating this medication well. She has achieved a complete hematological response.   Molecular studies done in October 2015 did not show evidence of residual disease in the form of BCR-ABL  She continues to have excellent clinical benefit with complete hematological remission. The plan is continue the same dose and schedule and repeat molecular testing for the next visit.  2. Diabetes mellitus: She is currently on metformin without any complications. I continue to educate her about the potential hypoglycemia fact of Tasigna. No recent exacerbation related to her blood sugar.  3. Cardiomyopathy: There is no evidence of exacerbation related to Tasigna. No recent congestive heart failure exacerbation.  4.  Follow-up:  6 months.      Zola Button 10/10/20182:42 PM

## 2017-09-08 NOTE — Telephone Encounter (Signed)
Gave avs and calendar for April 2019 °

## 2017-10-07 ENCOUNTER — Other Ambulatory Visit: Payer: Self-pay | Admitting: *Deleted

## 2017-10-07 DIAGNOSIS — C9210 Chronic myeloid leukemia, BCR/ABL-positive, not having achieved remission: Secondary | ICD-10-CM

## 2017-10-07 MED ORDER — NILOTINIB HCL 150 MG PO CAPS: 300.0000 mg | ORAL_CAPSULE | Freq: Two times a day (BID) | ORAL | 0 refills | Status: DC

## 2017-10-12 ENCOUNTER — Other Ambulatory Visit: Payer: Self-pay | Admitting: *Deleted

## 2017-10-12 DIAGNOSIS — C9210 Chronic myeloid leukemia, BCR/ABL-positive, not having achieved remission: Secondary | ICD-10-CM

## 2017-10-12 MED ORDER — NILOTINIB HCL 150 MG PO CAPS: 300.0000 mg | ORAL_CAPSULE | Freq: Two times a day (BID) | ORAL | 0 refills | Status: DC

## 2017-10-14 ENCOUNTER — Ambulatory Visit: Payer: PPO | Admitting: Cardiovascular Disease

## 2017-11-04 ENCOUNTER — Telehealth: Payer: Self-pay | Admitting: Pharmacy Technician

## 2017-11-04 NOTE — Telephone Encounter (Signed)
Oral Oncology Patient Advocate Encounter  Met patient in [place] to complete a renewal application for [employer] Patient [place] ([employer]) in an effort to keep the patient's out of pocket expense for Tasigna at $0.    Application completed and faxed to [phone].   [employer]PA[employer] phone number for follow up is [phone].   This encounter will be updated until final determination.   [name]. [name], [place] Patient [place] [phone] 11/04/2017 4:04 PM

## 2017-11-19 NOTE — Telephone Encounter (Signed)
Received a fax from [employer] stating that they had not received the patient's application for 0962 re-enrollment.  I contacted [employer] and was informed by the representative there that the information HAD been received and was currently being processed.    [name]. [name], [place] Patient [place] [phone] 11/19/2017 11:41 AM

## 2017-12-01 ENCOUNTER — Other Ambulatory Visit: Payer: Self-pay | Admitting: *Deleted

## 2017-12-01 ENCOUNTER — Encounter: Payer: Self-pay | Admitting: *Deleted

## 2017-12-01 DIAGNOSIS — C9210 Chronic myeloid leukemia, BCR/ABL-positive, not having achieved remission: Secondary | ICD-10-CM

## 2017-12-01 MED ORDER — NILOTINIB HCL 150 MG PO CAPS: 300.0000 mg | ORAL_CAPSULE | Freq: Two times a day (BID) | ORAL | 0 refills | Status: DC

## 2017-12-06 DIAGNOSIS — F419 Anxiety disorder, unspecified: Secondary | ICD-10-CM | POA: Diagnosis not present

## 2017-12-06 DIAGNOSIS — E119 Type 2 diabetes mellitus without complications: Secondary | ICD-10-CM | POA: Diagnosis not present

## 2017-12-06 DIAGNOSIS — Z79899 Other long term (current) drug therapy: Secondary | ICD-10-CM | POA: Diagnosis not present

## 2017-12-06 DIAGNOSIS — Z6830 Body mass index (BMI) 30.0-30.9, adult: Secondary | ICD-10-CM | POA: Diagnosis not present

## 2017-12-06 DIAGNOSIS — I10 Essential (primary) hypertension: Secondary | ICD-10-CM | POA: Diagnosis not present

## 2017-12-06 DIAGNOSIS — Z1331 Encounter for screening for depression: Secondary | ICD-10-CM | POA: Diagnosis not present

## 2017-12-06 DIAGNOSIS — E890 Postprocedural hypothyroidism: Secondary | ICD-10-CM | POA: Diagnosis not present

## 2017-12-06 DIAGNOSIS — Z139 Encounter for screening, unspecified: Secondary | ICD-10-CM | POA: Diagnosis not present

## 2017-12-06 DIAGNOSIS — Z9181 History of falling: Secondary | ICD-10-CM | POA: Diagnosis not present

## 2017-12-06 DIAGNOSIS — R609 Edema, unspecified: Secondary | ICD-10-CM | POA: Diagnosis not present

## 2017-12-06 DIAGNOSIS — I4891 Unspecified atrial fibrillation: Secondary | ICD-10-CM | POA: Diagnosis not present

## 2017-12-28 NOTE — Telephone Encounter (Signed)
Oral Oncology Patient Advocate Encounter  Received notification from [employer] Patient Assistance program that patient has been successfully re-enrolled into their program to continue to receive Tasigna from the manufacturer at $0 out of pocket until 11/29/2018.   I called left a message for the patient.  Oral Oncology Clinic will continue to follow.  Gilmore Laroche, CPhT, [place] Oral Oncology Patient Advocate [phone] 12/28/2017 3:28 PM

## 2018-01-12 ENCOUNTER — Encounter: Payer: Self-pay | Admitting: Sports Medicine

## 2018-01-12 ENCOUNTER — Ambulatory Visit (INDEPENDENT_AMBULATORY_CARE_PROVIDER_SITE_OTHER): Payer: PPO | Admitting: Sports Medicine

## 2018-01-12 DIAGNOSIS — M79675 Pain in left toe(s): Secondary | ICD-10-CM | POA: Diagnosis not present

## 2018-01-12 DIAGNOSIS — M21619 Bunion of unspecified foot: Secondary | ICD-10-CM

## 2018-01-12 DIAGNOSIS — M79674 Pain in right toe(s): Secondary | ICD-10-CM | POA: Diagnosis not present

## 2018-01-12 DIAGNOSIS — L97511 Non-pressure chronic ulcer of other part of right foot limited to breakdown of skin: Secondary | ICD-10-CM | POA: Diagnosis not present

## 2018-01-12 DIAGNOSIS — B351 Tinea unguium: Secondary | ICD-10-CM

## 2018-01-12 DIAGNOSIS — E1142 Type 2 diabetes mellitus with diabetic polyneuropathy: Secondary | ICD-10-CM

## 2018-01-12 DIAGNOSIS — I739 Peripheral vascular disease, unspecified: Secondary | ICD-10-CM

## 2018-01-12 DIAGNOSIS — L84 Corns and callosities: Secondary | ICD-10-CM | POA: Diagnosis not present

## 2018-01-12 DIAGNOSIS — M2142 Flat foot [pes planus] (acquired), left foot: Secondary | ICD-10-CM

## 2018-01-12 DIAGNOSIS — M2141 Flat foot [pes planus] (acquired), right foot: Secondary | ICD-10-CM

## 2018-01-12 NOTE — Progress Notes (Signed)
Subjective: Sara Warner is a 77 y.o. female patient with history of diabetes who presents to office today complaining of 1.  A soft spongy area at the area of previous callus on her right foot for the past month states that the blood underneath the area Getting larger and larger and when her daughter saw her foot her daughter recommended her come for a follow-up since it was not getting better.  2. Long,mildly painful nails  while ambulating in shoes; unable to trim. 3.  Callus plantar aspect of left foot unable to keep trimmed reports that she has tried soaking to keep the area soft however continues to come back.  Patient states that the glucose reading this morning was not recorded. Patient denies any new changes in medication or new problems.   Patient Active Problem List   Diagnosis Date Noted  . NICM (nonischemic cardiomyopathy) ([place])   . Chronic atrial fibrillation ([place])   . Hypertensive heart disease   . Stage III chronic kidney disease ([place]) 10/02/2015  . Controlled type 2 diabetes mellitus without complication ([place]) 97/35/3299  . CML (chronic myelocytic leukemia) ([place]) 01/10/2013  . Atrial fibrillation ([place]) 01/05/2012  . Hypothyroid 01/05/2012  . Cardiomyopathy ([place]) 01/05/2012   Current Outpatient Medications on File Prior to Visit  Medication Sig Dispense Refill  . aspirin EC 81 MG tablet Take 1 tablet (81 mg total) by mouth daily. 90 tablet 3  . clorazepate (TRANXENE-T) 7.5 MG tablet Take 1 tablet (7.5 mg total) by mouth 2 (two) times daily as needed for anxiety. 30 tablet 1  . digoxin (LANOXIN) 0.125 MG tablet Take 0.125 mg by mouth 2 (two) times a week. Takes on Monday, and friday only    . furosemide (LASIX) 20 MG tablet Take 2 tablets in the morning ( 40 mg). Take 1 tablet (36m) as needed for 2 lb gain over 24 hours. 90 tablet 6  . glipiZIDE (GLUCOTROL XL) 10 MG 24 hr tablet Take 10 mg by mouth 2 (two) times daily.    . metFORMIN (GLUCOPHAGE-XR) 500 MG 24 hr tablet Take 1  tablet by mouth 2 (two) times daily.    . metoprolol (TOPROL-XL) 200 MG 24 hr tablet TAKE 1 TABLET BY MOUTH EVERY DAY 30 tablet 6  . nilotinib (TASIGNA) 150 MG capsule Take 2 capsules (300 mg total) by mouth every 12 (twelve) hours. Take on an empty stomach, 1 hour before or 2 hours after meals. 112 capsule 0  . potassium chloride SA (K-DUR,KLOR-CON) 20 MEQ tablet Take 20 mEq by mouth daily.    . sitaGLIPtin (JANUVIA) 50 MG tablet Take 50 mg by mouth.     . SYNTHROID 150 MCG tablet Take 150 mcg by mouth daily.  1  . furosemide (LASIX) 20 MG tablet TAKE 2 TABLETS IN THE MORNING AND 1 TABLET IN THE EVENING 90 tablet 6  . spironolactone (ALDACTONE) 25 MG tablet Take 0.5 tablets (12.5 mg total) by mouth daily. At 5:00 pm 30 tablet 3  . [DISCONTINUED] lisinopril (ZESTRIL) 2.5 MG tablet Take 1 tablet (2.5 mg total) by mouth daily. 90 tablet 4   No current facility-administered medications on file prior to visit.    Allergies  Allergen Reactions  . Losartan Other (See Comments)    Patient reports nightmares/vivid dreams when taking.  . Lisinopril Cough    No results found for this or any previous visit (from the past 2160 hour(s)).  Objective: General: Patient is awake, alert, and oriented x 3 and in no  acute distress.  Integument: Skin is warm, dry and supple bilateral. Nails are tender, long, thickened and  dystrophic with subungual debris, consistent with onychomycosis, 1-5 bilateral. No signs of infection.+ preulcerative lesion present sub met 1 on left. To right foot there is a bloody callus, once parred a partial thickness ulceration was noted measuring 0.6x0.5cm with a granular base and mildly macerated margins no erythema no edema no other acute signs of infection. Remaining integument unremarkable.  Vasculature:  Dorsalis Pedis pulse 1/4 bilateral. Posterior Tibial pulse  1/4 bilateral.  Capillary fill time <3 sec 1-5 bilateral.  Scant hair growth to the level of the  digits. Temperature gradient within normal limits.  Mild varicosities present bilateral.  Trace edema and mild hyperpigmentation present bilateral.   Neurology: The patient has diminished sensation measured with a 5.07/10g Semmes Weinstein Monofilament at all pedal sites bilateral. Vibratory sensation diminished bilateral with tuning fork. No Babinski sign present bilateral.   Musculoskeletal: Asymptomatic bunion and hammertoe and pes planus pedal deformities noted bilateral. Muscular strength 5/5 in all lower extremity muscular groups bilateral without pain on range of motion .  No pain to ulcerated area on right foot.  No tenderness with calf compression bilateral.  Assessment and Plan: Problem List Items Addressed This Visit    None    Visit Diagnoses    Right foot ulcer, limited to breakdown of skin (HCC)    -  Primary   Pre-ulcerative calluses       Pain due to onychomycosis of toenails of both feet       DM type 2 with diabetic peripheral neuropathy (HCC)       Bunion       Pes planus of both feet       PVD (peripheral vascular disease) ([place])         -Examined patient. -Discussed and educated patient on diabetic foot care, especially with  regards to the vascular, neurological and musculoskeletal systems.  -Stressed the importance of good glycemic control and the detriment of not  controlling glucose levels in relation to the foot.  -Excisionally dedbrided ulceration at sub-met one on right to healthy bleeding borders removing nonviable tissue using a sterile chisel blade. Wound measures post debridement as above.  Wound was debrided to the level of the dermis with viable wound base exposed to promote healing. Hemostasis was achieved with manuel pressure. Patient tolerated procedure well without any discomfort or anesthesia necessary for this wound debridement.  -Applied antibiotic cream and dry sterile Band-Aid dressing and instructed patient to continue with daily dressings at home  consisting of the same.  Advised patient to refrain from soaking her feet while she has this open wound. - Advised patient to go to the ER or return to office if the wound worsens or if constitutional symptoms are present. -Mechanically debrided all nails 1-5 bilateral using sterile nail nipper and filed with dremel without incident -Mechanically debrided callus sub-met one on the left using sterile chisel blade without incident -Applied offloading padding in shoes -Answered all patient questions -[employer] diabetic shoe order form was completed; office to contact primary care for approval / certification;  Office to arrange shoe fitting and dispensing. -Patient to return 2 weeks for follow-up wound care right foot -Patient advised to call the office if any problems or questions arise in the meantime.  Landis Martins, DPM

## 2018-01-21 ENCOUNTER — Telehealth: Payer: Self-pay | Admitting: Cardiovascular Disease

## 2018-01-21 NOTE — Telephone Encounter (Signed)
NEW MESSAGE    Pt c/o Shortness Of Breath: STAT if SOB developed within the last 24 hours or pt is noticeably SOB on the phone  1. Are you currently SOB (can you hear that pt is SOB on the phone)? NO  2. How long have you been experiencing SOB? 1 MONTH  3. Are you SOB when sitting or when up moving around?  When waking up in the morning  4. Are you currently experiencing any other symptoms? NO

## 2018-01-21 NOTE — Telephone Encounter (Signed)
Returned call to pt she states that she has had increased SOB x1 month. She states that she is taking her medications as directed. She states that she does not have any increased swelling she has not taken her weight lately. She states that she feels better about 1-2 hours after taking her lasix but is still "a little" SOB but not nearly as much as before taking her medications. Appt scheduled for Monday to discuss with TK. She will take to weight and BP daily until appt she will bring logs with her until appt. She states that she will go to the ER if needed before the appt.

## 2018-01-24 ENCOUNTER — Encounter: Payer: Self-pay | Admitting: Cardiovascular Disease

## 2018-01-24 ENCOUNTER — Ambulatory Visit: Payer: PPO | Admitting: Cardiovascular Disease

## 2018-01-24 VITALS — BP 130/78 | HR 97 | Ht 68.0 in | Wt 204.0 lb

## 2018-01-24 DIAGNOSIS — Z72820 Sleep deprivation: Secondary | ICD-10-CM

## 2018-01-24 DIAGNOSIS — N183 Chronic kidney disease, stage 3 unspecified: Secondary | ICD-10-CM

## 2018-01-24 DIAGNOSIS — I10 Essential (primary) hypertension: Secondary | ICD-10-CM

## 2018-01-24 DIAGNOSIS — I482 Chronic atrial fibrillation, unspecified: Secondary | ICD-10-CM

## 2018-01-24 DIAGNOSIS — Z79899 Other long term (current) drug therapy: Secondary | ICD-10-CM | POA: Diagnosis not present

## 2018-01-24 DIAGNOSIS — E039 Hypothyroidism, unspecified: Secondary | ICD-10-CM

## 2018-01-24 DIAGNOSIS — I428 Other cardiomyopathies: Secondary | ICD-10-CM

## 2018-01-24 DIAGNOSIS — E119 Type 2 diabetes mellitus without complications: Secondary | ICD-10-CM

## 2018-01-24 MED ORDER — SPIRONOLACTONE 25 MG PO TABS: 12.5000 mg | ORAL_TABLET | Freq: Two times a day (BID) | ORAL | 3 refills | Status: DC

## 2018-01-24 NOTE — Patient Instructions (Signed)
Medication Instructions:  INCREASE spironolactone to 12.5 mg two times daily  Labwork: Please return for FASTING labs prior to follow up in April (CMET, CBC, Lipid, TSH)  Testing/Procedures: Your physician has requested that you have an echocardiogram. Echocardiography is a painless test that uses sound waves to create images of your heart. It provides your doctor with information about the size and shape of your heart and how well your heart's chambers and valves are working. This procedure takes approximately one hour. There are no restrictions for this procedure.  This will be done at our [facility] location:  [address]  Follow-Up: 2 months with Dr. Claiborne Billings  Any Other Special Instructions Will Be Listed Below (If Applicable).     If you need a refill on your cardiac medications before your next appointment, please call your pharmacy.

## 2018-01-24 NOTE — Progress Notes (Signed)
Patient ID: Sara Warner, female   DOB: 1941-06-30, 77 y.o.   MRN: [id number]     HPI: Sara Warner is a 77 year old female who presents to the office today for a 7 month followup cardiology evaluation.   Sara Warner is a former patient of Dr. Doreatha Lew. She has a history of permanent atrial fibrillation and admits to being in atrial fibrillation for >18 years. She had problems with recurrent epistaxis in the past on Coumadin and consequently has not been on anticoagulation therapy. An echo Doppler study in 2008 showed an ejection fraction of 30-35%. A Myoview in February 2009 showed no ischemia but mild global reduction in LV function with an ejection fraction of 47%. An echo in 2010  showed low normal LV function with mild LA enlargement and mild mitral regurgitation and tricuspid regurgitation.  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 She does also have issues with thyroid abnormality. She has varicose veins and notes right lower extremity swelling.   When  I initially saw her in 2014, we discussed potential thromboembolic risk associated with atrial fibrillation. She has not been on any coagulation due to frequent nosebleeds requiring cauterization and does have CML. She did have a significant remote tobacco history of 2 packs per day for over 20 years. She had nonspecific ST changes inferolaterally on her EKG. A nuclear perfusion scan  on 07/05/2013 was not gated to her atrial fibrillation and was a low risk study demonstrating mild artifact. There was no scar or ischemia. A 2-D echo Doppler study showed normal systolic function and Doppler parameters suggested increased mean left atrial pressure. She is systolic bowing of her mitral valve leaflet without definitive prolapse with mild MR. Her left atrium was moderate to severely dilated and her right atrium was moderately dilated. There was mild pulmonary hypertension with PA pressure estimated at 34 mm.   A  follow-up echo Doppler study on  05/30/2015 revealed an ejection fraction of 35-40%.  There was diffuse hypokinesis with suggestion of akinesis involving the basal to mid inferoseptal wall.  There was aortic valve sclerosis without stenosis, and mild-to-moderate mitral and tricuspid regurgitation.  Her left atrium was moderately dilated.  There was mild pulmonary hypertension with estimated PA pressure 37 mm.  When I last saw her in July 2018 she was experiencing weakness and shortness of breath.  She was often sleeping in a recliner and was waking up at least  3-4 times per night for nocturia.  She denies associated chest pressure.  She has a history of kidney stones.  She has a history of renal insufficiency.  I saw her, I added spironolactone 12.5 mg.  I also suggested a sleep study for high concern for sleep apnea.  Her shortness of breath did slightly improve with spironolactone.  She continues to be on furosemide 40 mg daily She never followed up with a sleep study.    She still notes some shortness of breath in the morning.  She still sleeps in a recliner.  Her father passed away in 11/13/23.  She has been on rate control for atrial fibrillation and most recently is on digoxin 0.125 mg daily and Toprol-XL 200 mg daily. Remotely, she had developed significant lower extremity edema on verapamil. She is diabetic on Glucotrol XL and Januvia.  She has a history of hypothyroidism on Synthroid 150 g.  GERD on Prilosec.  There also is a remote history of microcytic indices.  She is on the Tasigna for her CML. She presents for  follow-up evaluation.  Past Medical History:  Diagnosis Date  . Atrial fibrillation ([place])   . Chronic atrial fibrillation ([place])    a. Refuses [place].  CHA2DS2VASc = 4.  . Chronic systolic CHF (congestive heart failure) ([place])    a. 05/2015 Echo: EF 35-40%.  . CKD (chronic kidney disease), stage III ([place])   . CML (chronic myelocytic leukemia) ([place]) 01/10/2013  . DM (diabetes mellitus) ([place])   . Elevated WBC count   .  Hypertensive heart disease   . Hypothyroidism   . NICM (nonischemic cardiomyopathy) ([place])    a. 06/2013 MV: low risk study w/o ischemia;  b. 05/2015 Echo: EF 35-40%, diff HK, basal-midinferoseptal and basal-midanteroseptal AK. Mild-mod MR, mod dil LA, mild to mod TR, PASP 43mHg.  .Marland KitchenToxic goiter     Past Surgical History:  Procedure Laterality Date  . CARDIOVERSION    . CYSTECTOMY    . HYSTEROTOMY      Allergies  Allergen Reactions  . Losartan Other (See Comments)    Patient reports nightmares/vivid dreams when taking.  . Lisinopril Cough    Current Outpatient Medications  Medication Sig Dispense Refill  . clorazepate (TRANXENE-T) 7.5 MG tablet Take 1 tablet (7.5 mg total) by mouth 2 (two) times daily as needed for anxiety. 30 tablet 1  . digoxin (LANOXIN) 0.125 MG tablet Take 0.125 mg by mouth once a week. Takes on Monday, and friday only    . furosemide (LASIX) 20 MG tablet Take 2 tablets in the morning ( 40 mg). Take 1 tablet (250m as needed for 2 lb gain over 24 hours. 90 tablet 6  . furosemide (LASIX) 20 MG tablet TAKE 2 TABLETS IN THE MORNING AND 1 TABLET IN THE EVENING 90 tablet 6  . glipiZIDE (GLUCOTROL XL) 10 MG 24 hr tablet Take 10 mg by mouth 2 (two) times daily.    . metFORMIN (GLUCOPHAGE-XR) 500 MG 24 hr tablet Take 1 tablet by mouth 2 (two) times daily.    . metoprolol (TOPROL-XL) 200 MG 24 hr tablet TAKE 1 TABLET BY MOUTH EVERY DAY 30 tablet 6  . nilotinib (TASIGNA) 150 MG capsule Take 2 capsules (300 mg total) by mouth every 12 (twelve) hours. Take on an empty stomach, 1 hour before or 2 hours after meals. 112 capsule 0  . potassium chloride SA (K-DUR,KLOR-CON) 20 MEQ tablet Take 20 mEq by mouth daily.    . sitaGLIPtin (JANUVIA) 50 MG tablet Take 50 mg by mouth.     . SYNTHROID 150 MCG tablet Take 150 mcg by mouth daily.  1  . spironolactone (ALDACTONE) 25 MG tablet Take 0.5 tablets (12.5 mg total) by mouth 2 (two) times daily. 90 tablet 3   No current  facility-administered medications for this visit.     Socially she is widowed for 24 years. She has 5 children 15 grandchildren and 17 great grandchildren. She lives at home with her 2 grandchildren. She is retired. She completed ninth grade of education. She is a former smoker but quit smoking in 1987 there prior to that she had smoked up to 2 packs per day for 20 years. There is no alcohol use.  Family History  Problem Relation Age of Onset  . Cancer - Colon Mother        mast to lungs  . Diabetes Father   . Diabetes Paternal Grandmother     ROS General: Negative; No fevers, chills, or night sweats;  HEENT: Negative; No changes in vision or hearing, sinus  congestion, difficulty swallowing Pulmonary: Negative; No cough, wheezing, shortness of breath, hemoptysis Cardiovascular: Negative; No chest pain, presyncope, syncope, palpitations Occasional leg swelling GI: Negative; No nausea, vomiting, diarrhea, or abdominal pain GU: Negative; No dysuria, hematuria, or difficulty voiding Musculoskeletal: Negative; no myalgias, joint pain, or weakness Hematologic/Oncology: Positive for CML Endocrine: Positive for hypothyroidism and diabetes Neuro: Negative; no changes in balance, headaches Skin: Negative; No rashes or skin lesions Psychiatric: Negative; No behavioral problems, depression Sleep: Poor sleep; frequent nocturia;  daytime sleepiness, hypersomnolence, bruxism, restless legs, hypnogognic hallucinations, no cataplexy Other comprehensive 14 point system review is negative.  PE BP 130/78   Pulse 97   Ht 5' 8"  (1.727 m)   Wt 204 lb (92.5 kg)   BMI 31.02 kg/m    Repeat blood pressure by me was 134/82  Wt Readings from Last 3 Encounters:  01/24/18 204 lb (92.5 kg)  09/08/17 195 lb 12.8 oz (88.8 kg)  06/08/17 201 lb 9.6 oz (91.4 kg)   General: Alert, oriented, no distress.  Skin: normal turgor, no rashes, warm and dry HEENT: Normocephalic, atraumatic. Pupils equal round and  reactive to light; sclera anicteric; extraocular muscles intact;  Nose without nasal septal hypertrophy Mouth/Parynx benign; Mallinpatti scale 3 Neck: No JVD, no carotid bruits; normal carotid upstroke Lungs: clear to ausculatation and percussion; no wheezing or rales Chest wall: without tenderness to palpitation Heart: PMI not displaced, RRR, s1 s2 normal, 1/6 systolic murmur, no diastolic murmur, no rubs, gallops, thrills, or heaves Abdomen: soft, nontender; no hepatosplenomehaly, BS+; abdominal aorta nontender and not dilated by palpation. Back: no CVA tenderness Pulses 2+ Musculoskeletal: full range of motion, normal strength, no joint deformities Extremities: Varicosities, lower extremity; no clubbing cyanosis or edema, Homan's sign negative  Neurologic: grossly nonfocal; Cranial nerves grossly wnl Psychologic: Normal mood and affect   ECG (independently read by me): Atrial fibrillation at 97 bpm.  QTc interval 408 ms.  July 2018 ECG (independently read by me): Atrial fibrillation at 83 bpm.  Nonspecific ST changes.  Normal intervals.  December 2017 ECG (independently read by me): Atrial fibrillation with ventricular rate at 90 bpm.  LVH by voltage.  Nonspecific ST changes.  February 2017 ECG (independently read by me):  Atrial fibrillation with ventricular rate at 78 bpm.  Nonspecific ST changes, probably secondary to digoxin effect. Normal QTc interval.  November 2016 ECG (independently read by me): Atrial fibrillation with a ventricular rate in the 90s.  LVH by voltage.  Inferolateral ST-T changes possibly contributed by digoxin.  August 2016 ECG (independently read by me): Atrial fibrillation with rate in the 80s.  Inferolateral ST-T changes.  QTc interval 420 ms.  June 2016 ECG (independently read by me): Atrial fibrillation with rapid ventricular response with a rate December 2017 at 1 21 bpm.  Nonspecific inferolateral ST segment changes.  LABS:  BMP Latest Ref Rng & Units  09/08/2017 06/30/2017 03/17/2017  Glucose 70 - 140 mg/dl 143(H) 136(H) 174(H)  BUN 7.0 - 26.0 mg/dL 26.9(H) 35(H) 26.1(H)  Creatinine 0.6 - 1.1 mg/dL 1.4(H) 1.49(H) 1.5(H)  BUN/Creat Ratio 12 - 28 - 23 -  Sodium 136 - 145 mEq/L 140 141 140  Potassium 3.5 - 5.1 mEq/L 4.0 4.9 4.7  Chloride 96 - 106 mmol/L - 101 -  CO2 22 - 29 mEq/L 24 22 26   Calcium 8.4 - 10.4 mg/dL 9.5 10.1 9.5   Hepatic Function Latest Ref Rng & Units 09/08/2017 03/17/2017 09/16/2016  Total Protein 6.4 - 8.3 g/dL 7.0 7.1 6.8  Albumin 3.5 - 5.0 g/dL 4.2 4.4 3.9  AST 5 - 34 U/L 19 19 19   ALT 0 - 55 U/L 23 25 22   Alk Phosphatase 40 - 150 U/L 44 48 58  Total Bilirubin 0.20 - 1.20 mg/dL 0.65 1.35(H) 1.09   CBC Latest Ref Rng & Units 09/08/2017 03/17/2017 09/16/2016  WBC 3.9 - 10.3 10e3/uL 11.3(H) 10.4(H) 10.6(H)  Hemoglobin 11.6 - 15.9 g/dL 13.2 13.0 12.9  Hematocrit 34.8 - 46.6 % 41.0 41.4 39.6  Platelets 145 - 400 10e3/uL 350 326 353   Lab Results  Component Value Date   MCV 86.2 09/08/2017   MCV 85.0 03/17/2017   MCV 84.1 09/16/2016   Lab Results  Component Value Date   TSH 1.932 05/30/2015  No results found for: HGBA1C   Lipid Panel     Component Value Date/Time   CHOL 128 05/30/2015 0932   TRIG 110 05/30/2015 0932   HDL 27 (L) 05/30/2015 0932   CHOLHDL 4.7 05/30/2015 0932   VLDL 22 05/30/2015 0932   LDLCALC 79 05/30/2015 0932    RADIOLOGY: No results found.   IMPRESSION:  1. Chronic atrial fibrillation ([place])   2. NICM (nonischemic cardiomyopathy) ([place])   3. Essential hypertension   4. CKD (chronic kidney disease), stage III ([place])   5. Medication management   6. Hypothyroidism, unspecified type   7. Controlled type 2 diabetes mellitus without complication, without long-term current use of insulin ([place])   8. Poor sleep      ASSESSMENT AND PLAN: Ms. Warner is a 77 year old female who has a history of permanent atrial fibrillation for almost 20 years. She has not been on anticoagulation due  to history of frequent nosebleeds in the past requiring cauterization.   She is aware of potential for CVA risk with atrial fibrillation compounding over the years.  She continues to be adamant against initiating anticoagulation, whether with warfarin or with the newer agents.  She is on aspirin.  He recently was found to have blood in her urine and was diagnosed as having a renal stone.  She was diagnosed with CML and is undergoing treatment.  .  She has continued to experience shortness of breath, which has improved.  Her atrial fibrillation rate is controlled on her current regimen of high-dose Toprol-XL 20 mg daily in addition to digoxin 0.125 mg.  She continues to be on furosemide 40 mg and has been taking spironolactone 12.5 mg in the morning.  I have suggested she increase spironolactone to 12.5 mg twice a day.  She is diabetic on metformin, Januvia, and glipizide.  Hemoglobin A1c in January 2019 was 7.1.  She has stable renal function with a creatinine of 1.3 with her diabetes mellitus.  Target LDL is less than 70 and recent LDL cholesterol in May 2018 was 57.  I again suspect sleep apnea as a contributor to her poor sleep.  She does not want to pursue a sleep study.  Her last echo Doppler study was in June 2016 which showed an EF of 35-40%.  I'm recommending a follow-up echo Doppler study.  If LV function remains reduced, I will try to initiate possible Entresto or ARB therapy.  If renal function remains stable.  She will undergo follow-up laboratory and office visit in 2 months.  Time spent: 25 minutes Troy Sine, MD, [facility] 01/25/2018 8:36 AM

## 2018-01-25 ENCOUNTER — Encounter: Payer: Self-pay | Admitting: Cardiovascular Disease

## 2018-01-26 ENCOUNTER — Ambulatory Visit: Payer: PPO | Admitting: Sports Medicine

## 2018-01-26 ENCOUNTER — Encounter: Payer: Self-pay | Admitting: Sports Medicine

## 2018-01-26 DIAGNOSIS — E1142 Type 2 diabetes mellitus with diabetic polyneuropathy: Secondary | ICD-10-CM

## 2018-01-26 DIAGNOSIS — L84 Corns and callosities: Secondary | ICD-10-CM | POA: Diagnosis not present

## 2018-01-26 NOTE — Progress Notes (Signed)
Subjective: Sara Warner is a 77 y.o. female patient seen in office for evaluation of ulcerated callus right foot.  Patient reports that is doing well and has remained dry and has healed up.  Patient states that she has no pain. Patient has a history of diabetes and a blood glucose level not recorded today however earlier this week was 138. Denies nausea/fever/vomiting/chills/night sweats/shortness of breath/pain. Patient has no other pedal complaints at this time.  Patient Active Problem List   Diagnosis Date Noted  . NICM (nonischemic cardiomyopathy) ([place])   . Chronic atrial fibrillation ([place])   . Hypertensive heart disease   . Stage III chronic kidney disease ([place]) 10/02/2015  . Controlled type 2 diabetes mellitus without complication ([place]) 46/56/8127  . 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 Atrial fibrillation ([place]) 01/05/2012  . Hypothyroid 01/05/2012  . Cardiomyopathy ([place]) 01/05/2012   Current Outpatient Medications on File Prior to Visit  Medication Sig Dispense Refill  . clorazepate (TRANXENE-T) 7.5 MG tablet Take 1 tablet (7.5 mg total) by mouth 2 (two) times daily as needed for anxiety. 30 tablet 1  . digoxin (LANOXIN) 0.125 MG tablet Take 0.125 mg by mouth once a week. Takes on Monday, and friday only    . furosemide (LASIX) 20 MG tablet Take 2 tablets in the morning ( 40 mg). Take 1 tablet (63m) as needed for 2 lb gain over 24 hours. 90 tablet 6  . furosemide (LASIX) 20 MG tablet TAKE 2 TABLETS IN THE MORNING AND 1 TABLET IN THE EVENING 90 tablet 6  . glipiZIDE (GLUCOTROL XL) 10 MG 24 hr tablet Take 10 mg by mouth 2 (two) times daily.    . metFORMIN (GLUCOPHAGE-XR) 500 MG 24 hr tablet Take 1 tablet by mouth 2 (two) times daily.    . metoprolol (TOPROL-XL) 200 MG 24 hr tablet TAKE 1 TABLET BY MOUTH EVERY DAY 30 tablet 6  . nilotinib (TASIGNA) 150 MG capsule Take 2 capsules (300 mg total) by mouth every 12 (twelve) hours. Take on an empty stomach, 1 hour before  or 2 hours after meals. 112 capsule 0  . potassium chloride SA (K-DUR,KLOR-CON) 20 MEQ tablet Take 20 mEq by mouth daily.    . sitaGLIPtin (JANUVIA) 50 MG tablet Take 50 mg by mouth.     . spironolactone (ALDACTONE) 25 MG tablet Take 0.5 tablets (12.5 mg total) by mouth 2 (two) times daily. 90 tablet 3  . SYNTHROID 150 MCG tablet Take 150 mcg by mouth daily.  1  . [DISCONTINUED] lisinopril (ZESTRIL) 2.5 MG tablet Take 1 tablet (2.5 mg total) by mouth daily. 90 tablet 4   No current facility-administered medications on file prior to visit.    Allergies  Allergen Reactions  . Losartan Other (See Comments)    Patient reports nightmares/vivid dreams when taking.  . Lisinopril Cough    No results found for this or any previous visit (from the past 2160 hour(s)).  Objective: There were no vitals filed for this visit.  General: Patient is awake, alert, oriented x 3 and in no acute distress.  Integument: Skin is warm, dry and supple bilateral. Nails are  short thickened and  dystrophic with subungual debris, consistent with onychomycosis, 1-5 bilateral. No signs of infection.+ preulcerative lesion present sub met 1 on left and right.  Previous ulceration sub-met one on right is now healed. Remaining integument unremarkable.  Vasculature:  Dorsalis Pedis pulse 1/4 bilateral. Posterior Tibial pulse  1/4 bilateral.  Capillary fill time <3  sec 1-5 bilateral.  Scant hair growth to the level of the digits. Temperature gradient within normal limits.  Mild varicosities present bilateral.  Trace edema and mild hyperpigmentation present bilateral.   Neurology: The patient has diminished sensation measured with a 5.07/10g Semmes Weinstein Monofilament at all pedal sites bilateral. Vibratory sensation diminished bilateral with tuning fork. No Babinski sign present bilateral.   Musculoskeletal: Asymptomatic bunion and hammertoe and pes planus pedal deformities noted bilateral. Muscular strength 5/5 in  all lower extremity muscular groups bilateral without pain on range of motion .  No pain sub-met one bilateral.  No tenderness with calf compression bilateral.  Assessment and Plan:  Problem List Items Addressed This Visit    None    Visit Diagnoses    Pre-ulcerative calluses    -  Primary   DM type 2 with diabetic peripheral neuropathy ([place])         -Examined patient -Discussed the progression of the healed ulceration at the previous callus sub-met one on right -Dressings are no longer needed since right sub-met one ulcerated callus is healed. -Recommend patient to continue with offloading padding and diabetic insoles until she is fitted for custom diabetic shoes which will make sure the insoles will offload sub-met one bilateral -Patient to continue with daily foot inspection in the setting of diabetes -Patient to return in 10 weeks for diabetic nail callus care or sooner if  Re-ulceration or problems happen.  Landis Martins, DPM

## 2018-01-31 ENCOUNTER — Other Ambulatory Visit: Payer: Self-pay | Admitting: Cardiovascular Disease

## 2018-02-03 ENCOUNTER — Ambulatory Visit: Payer: PPO | Admitting: Physician Assistant

## 2018-02-04 ENCOUNTER — Other Ambulatory Visit: Payer: Self-pay

## 2018-02-04 ENCOUNTER — Ambulatory Visit (HOSPITAL_COMMUNITY): Payer: PPO | Attending: Cardiology

## 2018-02-04 DIAGNOSIS — I482 Chronic atrial fibrillation, unspecified: Secondary | ICD-10-CM

## 2018-02-04 DIAGNOSIS — E1122 Type 2 diabetes mellitus with diabetic chronic kidney disease: Secondary | ICD-10-CM | POA: Insufficient documentation

## 2018-02-04 DIAGNOSIS — N183 Chronic kidney disease, stage 3 (moderate): Secondary | ICD-10-CM | POA: Insufficient documentation

## 2018-02-04 DIAGNOSIS — Z87891 Personal history of nicotine dependence: Secondary | ICD-10-CM | POA: Diagnosis not present

## 2018-02-04 DIAGNOSIS — I1310 Hypertensive heart and chronic kidney disease without heart failure, with stage 1 through stage 4 chronic kidney disease, or unspecified chronic kidney disease: Secondary | ICD-10-CM | POA: Diagnosis not present

## 2018-02-04 DIAGNOSIS — I081 Rheumatic disorders of both mitral and tricuspid valves: Secondary | ICD-10-CM | POA: Diagnosis not present

## 2018-02-04 DIAGNOSIS — I428 Other cardiomyopathies: Secondary | ICD-10-CM | POA: Insufficient documentation

## 2018-02-10 ENCOUNTER — Ambulatory Visit: Payer: PPO | Admitting: *Deleted

## 2018-02-10 DIAGNOSIS — E1142 Type 2 diabetes mellitus with diabetic polyneuropathy: Secondary | ICD-10-CM

## 2018-02-23 DIAGNOSIS — E119 Type 2 diabetes mellitus without complications: Secondary | ICD-10-CM | POA: Diagnosis not present

## 2018-02-23 DIAGNOSIS — Z6831 Body mass index (BMI) 31.0-31.9, adult: Secondary | ICD-10-CM | POA: Diagnosis not present

## 2018-02-23 DIAGNOSIS — G629 Polyneuropathy, unspecified: Secondary | ICD-10-CM | POA: Diagnosis not present

## 2018-03-01 ENCOUNTER — Other Ambulatory Visit: Payer: Self-pay | Admitting: Cardiovascular Disease

## 2018-03-09 ENCOUNTER — Inpatient Hospital Stay: Payer: PPO | Attending: Oncology | Admitting: Oncology

## 2018-03-09 ENCOUNTER — Telehealth: Payer: Self-pay | Admitting: Oncology

## 2018-03-09 ENCOUNTER — Inpatient Hospital Stay: Payer: PPO

## 2018-03-09 VITALS — BP 121/55 | HR 81 | Temp 98.0°F | Resp 19 | Ht 68.0 in | Wt 200.3 lb

## 2018-03-09 DIAGNOSIS — Z7984 Long term (current) use of oral hypoglycemic drugs: Secondary | ICD-10-CM | POA: Diagnosis not present

## 2018-03-09 DIAGNOSIS — C9210 Chronic myeloid leukemia, BCR/ABL-positive, not having achieved remission: Secondary | ICD-10-CM | POA: Insufficient documentation

## 2018-03-09 DIAGNOSIS — I429 Cardiomyopathy, unspecified: Secondary | ICD-10-CM | POA: Diagnosis not present

## 2018-03-09 DIAGNOSIS — E119 Type 2 diabetes mellitus without complications: Secondary | ICD-10-CM | POA: Diagnosis not present

## 2018-03-09 DIAGNOSIS — Z79899 Other long term (current) drug therapy: Secondary | ICD-10-CM | POA: Diagnosis not present

## 2018-03-09 LAB — CBC WITH DIFFERENTIAL/PLATELET
Basophils Absolute: 0.1 10*3/uL (ref 0.0–0.1)
Basophils Relative: 1 %
EOS PCT: 2 %
Eosinophils Absolute: 0.2 10*3/uL (ref 0.0–0.5)
HEMATOCRIT: 41.8 % (ref 34.8–46.6)
Hemoglobin: 13.3 g/dL (ref 11.6–15.9)
LYMPHS ABS: 3.2 10*3/uL (ref 0.9–3.3)
LYMPHS PCT: 33 %
MCH: 28.1 pg (ref 25.1–34.0)
MCHC: 31.8 g/dL (ref 31.5–36.0)
MCV: 88.2 fL (ref 79.5–101.0)
MONO ABS: 0.7 10*3/uL (ref 0.1–0.9)
MONOS PCT: 8 %
NEUTROS ABS: 5.6 10*3/uL (ref 1.5–6.5)
Neutrophils Relative %: 56 %
PLATELETS: 356 10*3/uL (ref 145–400)
RBC: 4.74 MIL/uL (ref 3.70–5.45)
RDW: 14.6 % — AB (ref 11.2–14.5)
WBC: 9.9 10*3/uL (ref 3.9–10.3)

## 2018-03-09 LAB — COMPREHENSIVE METABOLIC PANEL
ALBUMIN: 4.5 g/dL (ref 3.5–5.0)
ALT: 22 U/L (ref 0–55)
ANION GAP: 10 (ref 3–11)
AST: 17 U/L (ref 5–34)
Alkaline Phosphatase: 48 U/L (ref 40–150)
BILIRUBIN TOTAL: 1 mg/dL (ref 0.2–1.2)
BUN: 32 mg/dL — AB (ref 7–26)
CHLORIDE: 105 mmol/L (ref 98–109)
CO2: 25 mmol/L (ref 22–29)
Calcium: 9.5 mg/dL (ref 8.4–10.4)
Creatinine, Ser: 1.57 mg/dL — ABNORMAL HIGH (ref 0.60–1.10)
GFR calc Af Amer: 36 mL/min — ABNORMAL LOW (ref >60)
GFR calc non Af Amer: 31 mL/min — ABNORMAL LOW (ref >60)
GLUCOSE: 129 mg/dL (ref 70–140)
POTASSIUM: 4.3 mmol/L (ref 3.5–5.1)
SODIUM: 140 mmol/L (ref 136–145)
TOTAL PROTEIN: 7.5 g/dL (ref 6.4–8.3)

## 2018-03-09 NOTE — Progress Notes (Signed)
Hematology and Oncology Follow Up Visit  Sara Warner [id number] 26-May-1941 77 y.o. 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  She presented with leukocytosis and a bone marrow biopsy confirmed the diagnosis.  Current treatment: Tasigna 300 mg bid started 11/2012 she achieved complete hematological and molecular remission.  Interim History: Sara Warner is here for a follow-up.  She reports no major changes in her health since the last visit.  He continues to take Tasigna 300 mg bid regularly with very few missed doses.  She denies any new complications related to this medication.  She denies any excessive fatigue, tiredness or fluid retention.  She does report periodically episode of shortness of breath and does require extra doses of diuretics but no hospitalizations or illnesses.  She continues to attend to activities of daily living without any decline.  Appetite and performance status is unchanged.  She is not report any headaches, blurry vision, syncope or seizures. She  does not report any fevers, does not report any chills. She does not report any chest pain She does not report any dyspnea on exertion or leg edema. She does not report any cough or hemoptysis. He does not report any nausea, vomiting, abdominal pain. She does not report any hematochezia or melena.  She denies any frequency, urgency or hesitancy.  She does not report any skin rashes or lesions.  She denies any lymphadenopathy or petechiae.  The remaining review of systems is negative.  Medications: I have reviewed the patient's current medications. Current Outpatient Medications  Medication Sig Dispense Refill  . clorazepate (TRANXENE-T) 7.5 MG tablet Take 1 tablet (7.5 mg total) by mouth 2 (two) times daily as needed for anxiety. 30 tablet 1  . digoxin (LANOXIN) 0.125 MG tablet Take 0.125 mg by mouth once a week. Takes on Monday, and friday only    .  furosemide (LASIX) 20 MG tablet Take 2 tablets in the morning ( 40 mg). Take 1 tablet (2m) as needed for 2 lb gain over 24 hours. 90 tablet 6  . furosemide (LASIX) 20 MG tablet TAKE 2 TABLETS IN THE MORNING AND 1 TABLET IN THE EVENING 90 tablet 6  . glipiZIDE (GLUCOTROL XL) 10 MG 24 hr tablet Take 10 mg by mouth 2 (two) times daily.    . metFORMIN (GLUCOPHAGE-XR) 500 MG 24 hr tablet Take 1 tablet by mouth 2 (two) times daily.    . metoprolol (TOPROL-XL) 200 MG 24 hr tablet TAKE 1 TABLET BY MOUTH EVERY DAY 30 tablet 5  . nilotinib (TASIGNA) 150 MG capsule Take 2 capsules (300 mg total) by mouth every 12 (twelve) hours. Take on an empty stomach, 1 hour before or 2 hours after meals. 112 capsule 0  . potassium chloride SA (K-DUR,KLOR-CON) 20 MEQ tablet Take 20 mEq by mouth daily.    . sitaGLIPtin (JANUVIA) 50 MG tablet Take 50 mg by mouth.     . spironolactone (ALDACTONE) 25 MG tablet Take 0.5 tablets (12.5 mg total) by mouth 2 (two) times daily. 90 tablet 3  . spironolactone (ALDACTONE) 25 MG tablet TAKE 1/2 (ONE HALF) TABLET BY MOUTH DAILY AT 5 PM 30 tablet 3  . SYNTHROID 150 MCG tablet Take 150 mcg by mouth daily.  1   No current facility-administered medications for this visit.     Allergies:  Allergies  Allergen Reactions  . Losartan Other (See Comments)    Patient reports nightmares/vivid dreams when taking.  . Lisinopril  Cough    Past Medical History, Surgical history, Social history, and Family History were reviewed and updated.   Physical Exam: Blood pressure (!) 121/55, pulse 81, temperature 98 F (36.7 C), temperature source Oral, resp. rate 19, height 5' 8"  (1.727 m), weight 200 lb 4.8 oz (90.9 kg), SpO2 97 %.   ECOG: 1 General appearance: Alert, awake woman without distress. Head: Atraumatic without abnormalities. Oropharynx: Without any thrush or ulcers. Eyes: No scleral icterus. Lymph nodes: No lymphadenopathy palpated today in the cervical, axillary or  supraclavicular regions. Heart: Irregular without any murmurs or gallops. Lung: Clear to auscultation without any rhonchi, wheezes or dullness to percussion. Abdomen: Soft, nontender without any rebound or guarding. Musculoskeletal: No joint deformity or effusion. Skin: No ecchymosis or petechiae. Neurological: No motor or sensory deficits.  Lab Results: Lab Results  Component Value Date   WBC 11.3 (H) 09/08/2017   HGB 13.2 09/08/2017   HCT 41.0 09/08/2017   MCV 86.2 09/08/2017   PLT 350 09/08/2017     Chemistry      Component Value Date/Time   NA 140 09/08/2017 1417   K 4.0 09/08/2017 1417   CL 101 06/30/2017 1301   CL 106 04/26/2013 1252   CO2 24 09/08/2017 1417   BUN 26.9 (H) 09/08/2017 1417   CREATININE 1.4 (H) 09/08/2017 1417      Component Value Date/Time   CALCIUM 9.5 09/08/2017 1417   ALKPHOS 44 09/08/2017 1417   AST 19 09/08/2017 1417   ALT 23 09/08/2017 1417   BILITOT 0.65 09/08/2017 1417         Impression and Plan:   77 year old woman with:   1. Chronic phase CML diagnosed in 2013.  She remains on Tasigna since 11/2012 with complete hematological and molecular response.   The natural course of this disease was discussed today with the patient as well as treatment options.  Risks and benefits of continuing this medication were reviewed today she is agreeable to continue.  Molecular testing will be repeated today to ensure continuing molecular remission.  2. Diabetes mellitus: No recent complications of diabetes noted.  This medication can interfere with her hypoglycemic control and will continue to address that issue with her.  3. Cardiomyopathy: Appears to be compensated at this time. No exacerbation related to Tasigna.  Echocardiogram obtained in March 2019 showed improved EF compared to previous echoes.  4. Follow-up:  6 months.    15  minutes was spent with the patient face-to-face today.  More than 50% of time was dedicated to patient counseling,  education and coordination of the patient's multifaceted care.   Sara Warner 4/10/201911:41 AM

## 2018-03-09 NOTE — Telephone Encounter (Signed)
Scheduled appt per 4/10 los - Gave patient AVS and calender per los.  

## 2018-03-22 DIAGNOSIS — I428 Other cardiomyopathies: Secondary | ICD-10-CM | POA: Diagnosis not present

## 2018-03-22 DIAGNOSIS — E039 Hypothyroidism, unspecified: Secondary | ICD-10-CM | POA: Diagnosis not present

## 2018-03-22 DIAGNOSIS — Z79899 Other long term (current) drug therapy: Secondary | ICD-10-CM | POA: Diagnosis not present

## 2018-03-22 DIAGNOSIS — N183 Chronic kidney disease, stage 3 (moderate): Secondary | ICD-10-CM | POA: Diagnosis not present

## 2018-03-22 DIAGNOSIS — I10 Essential (primary) hypertension: Secondary | ICD-10-CM | POA: Diagnosis not present

## 2018-03-22 DIAGNOSIS — E119 Type 2 diabetes mellitus without complications: Secondary | ICD-10-CM | POA: Diagnosis not present

## 2018-03-22 DIAGNOSIS — I482 Chronic atrial fibrillation: Secondary | ICD-10-CM | POA: Diagnosis not present

## 2018-03-22 LAB — CBC
Hematocrit: 40.4 % (ref 34.0–46.6)
Hemoglobin: 13.5 g/dL (ref 11.1–15.9)
MCH: 28 pg (ref 26.6–33.0)
MCHC: 33.4 g/dL (ref 31.5–35.7)
MCV: 84 fL (ref 79–97)
PLATELETS: 372 10*3/uL (ref 150–379)
RBC: 4.82 x10E6/uL (ref 3.77–5.28)
RDW: 14.7 % (ref 12.3–15.4)
WBC: 10 10*3/uL (ref 3.4–10.8)

## 2018-03-22 LAB — LIPID PANEL
CHOLESTEROL TOTAL: 136 mg/dL (ref 100–199)
Chol/HDL Ratio: 4.4 ratio (ref 0.0–4.4)
HDL: 31 mg/dL — ABNORMAL LOW (ref >39)
LDL Calculated: 75 mg/dL (ref 0–99)
Triglycerides: 151 mg/dL — ABNORMAL HIGH (ref 0–149)
VLDL Cholesterol Cal: 30 mg/dL (ref 5–40)

## 2018-03-22 LAB — COMPREHENSIVE METABOLIC PANEL
A/G RATIO: 2.2 (ref 1.2–2.2)
ALBUMIN: 4.9 g/dL — AB (ref 3.5–4.8)
ALT: 20 IU/L (ref 0–32)
AST: 16 IU/L (ref 0–40)
Alkaline Phosphatase: 46 IU/L (ref 39–117)
BUN / CREAT RATIO: 20 (ref 12–28)
BUN: 28 mg/dL — ABNORMAL HIGH (ref 8–27)
Bilirubin Total: 1 mg/dL (ref 0.0–1.2)
CALCIUM: 9.9 mg/dL (ref 8.7–10.3)
CO2: 21 mmol/L (ref 20–29)
CREATININE: 1.42 mg/dL — AB (ref 0.57–1.00)
Chloride: 102 mmol/L (ref 96–106)
GFR, EST AFRICAN AMERICAN: 41 mL/min/{1.73_m2} — AB (ref >59)
GFR, EST NON AFRICAN AMERICAN: 36 mL/min/{1.73_m2} — AB (ref >59)
Globulin, Total: 2.2 g/dL (ref 1.5–4.5)
Glucose: 128 mg/dL — ABNORMAL HIGH (ref 65–99)
POTASSIUM: 5 mmol/L (ref 3.5–5.2)
SODIUM: 140 mmol/L (ref 134–144)
Total Protein: 7.1 g/dL (ref 6.0–8.5)

## 2018-03-22 LAB — TSH: TSH: 0.543 u[IU]/mL (ref 0.450–4.500)

## 2018-03-25 ENCOUNTER — Other Ambulatory Visit: Payer: Self-pay | Admitting: *Deleted

## 2018-03-25 DIAGNOSIS — C9210 Chronic myeloid leukemia, BCR/ABL-positive, not having achieved remission: Secondary | ICD-10-CM

## 2018-03-25 LAB — BCR/ABL

## 2018-03-25 MED ORDER — NILOTINIB HCL 150 MG PO CAPS: 300.0000 mg | ORAL_CAPSULE | Freq: Two times a day (BID) | ORAL | 0 refills | Status: DC

## 2018-03-29 ENCOUNTER — Encounter: Payer: Self-pay | Admitting: Cardiovascular Disease

## 2018-03-29 ENCOUNTER — Ambulatory Visit: Payer: PPO | Admitting: Cardiovascular Disease

## 2018-03-29 VITALS — BP 128/76 | HR 93 | Ht 68.0 in | Wt 202.8 lb

## 2018-03-29 DIAGNOSIS — N183 Chronic kidney disease, stage 3 unspecified: Secondary | ICD-10-CM

## 2018-03-29 DIAGNOSIS — I10 Essential (primary) hypertension: Secondary | ICD-10-CM | POA: Diagnosis not present

## 2018-03-29 DIAGNOSIS — I482 Chronic atrial fibrillation, unspecified: Secondary | ICD-10-CM

## 2018-03-29 DIAGNOSIS — E119 Type 2 diabetes mellitus without complications: Secondary | ICD-10-CM

## 2018-03-29 DIAGNOSIS — Z72820 Sleep deprivation: Secondary | ICD-10-CM

## 2018-03-29 DIAGNOSIS — I428 Other cardiomyopathies: Secondary | ICD-10-CM

## 2018-03-29 DIAGNOSIS — E039 Hypothyroidism, unspecified: Secondary | ICD-10-CM

## 2018-03-29 DIAGNOSIS — I272 Pulmonary hypertension, unspecified: Secondary | ICD-10-CM

## 2018-03-29 NOTE — Patient Instructions (Signed)
Medication Instructions:  Your physician recommends that you continue on your current medications as directed. Please refer to the Current Medication list given to you today.  Follow-Up: Your physician wants you to follow-up in: 12 months with Dr. Kelly.  You will receive a reminder letter in the mail two months in advance. If you don't receive a letter, please call our office to schedule the follow-up appointment.   If you need a refill on your cardiac medications before your next appointment, please call your pharmacy.   

## 2018-03-29 NOTE — Progress Notes (Signed)
Patient ID: Sara Warner, female   DOB: 09/20/1941, 77 y.o.   MRN: [id number]     HPI: Sara Warner is a 77 year old female who presents to the office today for a 2 month followup cardiology evaluation.   [name] is a former patient of Dr. Doreatha Lew. She has a history of permanent atrial fibrillation and admits to being in atrial fibrillation for >18 years. She had problems with recurrent epistaxis in the past on Coumadin and consequently has not been on anticoagulation therapy. An echo Doppler study in 2008 showed an ejection fraction of 30-35%. A Myoview in February 2009 showed no ischemia but mild global reduction in LV function with an ejection fraction of 47%. An echo in 2010  showed low normal LV function with mild LA enlargement and mild mitral regurgitation and tricuspid regurgitation.  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 She does also have issues with thyroid abnormality. She has varicose veins and notes right lower extremity swelling.   When  I initially saw her in 2014, we discussed potential thromboembolic risk associated with atrial fibrillation. She has not been on any coagulation due to frequent nosebleeds requiring cauterization and does have CML. She did have a significant remote tobacco history of 2 packs per day for over 20 years. She had nonspecific ST changes inferolaterally on her EKG. A nuclear perfusion scan  on 07/05/2013 was not gated to her atrial fibrillation and was a low risk study demonstrating mild artifact. There was no scar or ischemia. A 2-D echo Doppler study showed normal systolic function and Doppler parameters suggested increased mean left atrial pressure. She is systolic bowing of her mitral valve leaflet without definitive prolapse with mild MR. Her left atrium was moderate to severely dilated and her right atrium was moderately dilated. There was mild pulmonary hypertension with PA pressure estimated at 34 mm.   A  follow-up echo Doppler study on  05/30/2015 revealed an ejection fraction of 35-40%.  There was diffuse hypokinesis with suggestion of akinesis involving the basal to mid inferoseptal wall.  There was aortic valve sclerosis without stenosis, and mild-to-moderate mitral and tricuspid regurgitation.  Her left atrium was moderately dilated.  There was mild pulmonary hypertension with estimated PA pressure 37 mm.  When I last saw her in July 2018 she was experiencing weakness and shortness of breath.  She was often sleeping in a recliner and was waking up at least  3-4 times per night for nocturia.  She denies associated chest pressure.  She has a history of kidney stones.  She has a history of renal insufficiency.  I saw her, I added spironolactone 12.5 mg.  I also suggested a sleep study for high concern for sleep apnea.  Her shortness of breath did slightly improve with spironolactone.  She continues to be on furosemide 40 mg daily She never followed up with a sleep study.    She l notes some shortness of breath in the morning.  She still sleeps in a recliner.  Her father passed away in 11/25/2023.  She has been on rate control for atrial fibrillation and most recently is on digoxin 0.125 mg daily and Toprol-XL 200 mg daily. Remotely, she had developed significant lower extremity edema on verapamil. She is diabetic on Glucotrol XL and Januvia.  She has a history of hypothyroidism on Synthroid 150 g.  GERD on Prilosec.  There also is a remote history of microcytic indices.  She is on the Tasigna for her CML.  I last  saw her, I suspected sleep apnea as a potential contributor to her poor sleep.  However she did not want to pursue a sleep study.  Since her last echo was in June 2016 which showed an EF of 35 to 40%, I recommended a follow-up echo Doppler study.  Over the past several months she has felt fairly well.  She sees Cyndi Bender for her primary care.  She underwent a echo Doppler study on February 04, 2018.  This now showed improvement in LV  function with an EF now at 45 to 50%.  There was hypokinesis of the septum.  There was mild MR, her left atrium was moderately dilated as was her right atrium.  PA pressure was mildly increased.  He presents for reevaluation.  Past Medical History:  Diagnosis Date  . Atrial fibrillation ([place])   . Chronic atrial fibrillation ([place])    a. Refuses Power.  CHA2DS2VASc = 4.  . Chronic systolic CHF (congestive heart failure) ([place])    a. 05/2015 Echo: EF 35-40%.  . CKD (chronic kidney disease), stage III ([place])   . CML (chronic myelocytic leukemia) ([place]) 01/10/2013  . DM (diabetes mellitus) ([place])   . Elevated WBC count   . Hypertensive heart disease   . Hypothyroidism   . NICM (nonischemic cardiomyopathy) ([place])    a. 06/2013 MV: low risk study w/o ischemia;  b. 05/2015 Echo: EF 35-40%, diff HK, basal-midinferoseptal and basal-midanteroseptal AK. Mild-mod MR, mod dil LA, mild to mod TR, PASP 21mHg.  .Marland KitchenToxic goiter     Past Surgical History:  Procedure Laterality Date  . CARDIOVERSION    . CYSTECTOMY    . HYSTEROTOMY      Allergies  Allergen Reactions  . Losartan Other (See Comments)    Patient reports nightmares/vivid dreams when taking.  . Lisinopril Cough    Current Outpatient Medications  Medication Sig Dispense Refill  . clorazepate (TRANXENE-T) 7.5 MG tablet Take 1 tablet (7.5 mg total) by mouth 2 (two) times daily as needed for anxiety. 30 tablet 1  . digoxin (LANOXIN) 0.125 MG tablet Take 0.125 mg by mouth once a week. Takes on Monday only.    . furosemide (LASIX) 20 MG tablet Take 2 tablets in the morning ( 40 mg). Take 1 tablet (272m as needed for 2 lb gain over 24 hours. 90 tablet 6  . glipiZIDE (GLUCOTROL XL) 10 MG 24 hr tablet Take 10 mg by mouth 2 (two) times daily.    . metFORMIN (GLUCOPHAGE-XR) 500 MG 24 hr tablet Take 1 tablet by mouth 2 (two) times daily.    . metoprolol (TOPROL-XL) 200 MG 24 hr tablet TAKE 1 TABLET BY MOUTH EVERY DAY 30 tablet 5  . nilotinib (TASIGNA)  150 MG capsule Take 2 capsules (300 mg total) by mouth every 12 (twelve) hours. Take on an empty stomach, 1 hour before or 2 hours after meals. 112 capsule 0  . potassium chloride SA (K-DUR,KLOR-CON) 20 MEQ tablet Take 20 mEq by mouth daily.    . sitaGLIPtin (JANUVIA) 50 MG tablet Take 50 mg by mouth.     . spironolactone (ALDACTONE) 25 MG tablet Take 0.5 tablets (12.5 mg total) by mouth 2 (two) times daily. 90 tablet 3  . SYNTHROID 150 MCG tablet Take 150 mcg by mouth daily.  1   No current facility-administered medications for this visit.     Socially she is widowed for 24 years. She has 5 children 15 grandchildren and 17 great grandchildren. She lives  at home with her 2 grandchildren. She is retired. She completed ninth grade of education. She is a former smoker but quit smoking in 1987 there prior to that she had smoked up to 2 packs per day for 20 years. There is no alcohol use.  Family History  Problem Relation Age of Onset  . Cancer - Colon Mother        mast to lungs  . Diabetes Father   . Diabetes Paternal Grandmother     ROS General: Negative; No fevers, chills, or night sweats;  HEENT: Negative; No changes in vision or hearing, sinus congestion, difficulty swallowing Pulmonary: Negative; No cough, wheezing, shortness of breath, hemoptysis Cardiovascular: Negative; No chest pain, presyncope, syncope, palpitations Occasional leg swelling GI: Negative; No nausea, vomiting, diarrhea, or abdominal pain GU: Negative; No dysuria, hematuria, or difficulty voiding Musculoskeletal: Negative; no myalgias, joint pain, or weakness Hematologic/Oncology: Positive for CML Endocrine: Positive for hypothyroidism and diabetes Neuro: Negative; no changes in balance, headaches Skin: Negative; No rashes or skin lesions Psychiatric: Negative; No behavioral problems, depression Sleep: Poor sleep; frequent nocturia;  daytime sleepiness, hypersomnolence, bruxism, restless legs, hypnogognic  hallucinations, no cataplexy Other comprehensive 14 point system review is negative.  PE BP 128/76   Pulse 93   Ht 5' 8"  (1.727 m)   Wt 202 lb 12.8 oz (92 kg)   BMI 30.84 kg/m   Repeat blood pressure by me was 122/74  Wt Readings from Last 3 Encounters:  03/29/18 202 lb 12.8 oz (92 kg)  03/09/18 200 lb 4.8 oz (90.9 kg)  01/24/18 204 lb (92.5 kg)   General: Alert, oriented, no distress.  Skin: normal turgor, no rashes, warm and dry HEENT: Normocephalic, atraumatic. Pupils equal round and reactive to light; sclera anicteric; extraocular muscles intact;  Nose without nasal septal hypertrophy Mouth/Parynx benign; Mallinpatti scale 3 Neck: No JVD, no carotid bruits; normal carotid upstroke Lungs: clear to ausculatation and percussion; no wheezing or rales Chest wall: without tenderness to palpitation Heart: PMI not displaced, RRR, s1 s2 normal, 1/6 systolic murmur, no diastolic murmur, no rubs, gallops, thrills, or heaves Abdomen: soft, nontender; no hepatosplenomehaly, BS+; abdominal aorta nontender and not dilated by palpation. Back: no CVA tenderness Pulses 2+ Musculoskeletal: full range of motion, normal strength, no joint deformities Extremities: Lower extremity varicosities; no clubbing cyanosis or edema, Homan's sign negative  Neurologic: grossly nonfocal; Cranial nerves grossly wnl Psychologic: Normal mood and affect t  ECG (independently read by me): Atrial fibrillation at 93 bpm.  Borderline LVH.  No significant ST change.  February 2019 ECG (independently read by me): Atrial fibrillation at 97 bpm.  QTc interval 408 ms.  July 2018 ECG (independently read by me): Atrial fibrillation at 83 bpm.  Nonspecific ST changes.  Normal intervals.  December 2017 ECG (independently read by me): Atrial fibrillation with ventricular rate at 90 bpm.  LVH by voltage.  Nonspecific ST changes.  February 2017 ECG (independently read by me):  Atrial fibrillation with ventricular rate at  78 bpm.  Nonspecific ST changes, probably secondary to digoxin effect. Normal QTc interval.  November 2016 ECG (independently read by me): Atrial fibrillation with a ventricular rate in the 90s.  LVH by voltage.  Inferolateral ST-T changes possibly contributed by digoxin.  August 2016 ECG (independently read by me): Atrial fibrillation with rate in the 80s.  Inferolateral ST-T changes.  QTc interval 420 ms.  June 2016 ECG (independently read by me): Atrial fibrillation with rapid ventricular response with a rate December 2017  at 1 21 bpm.  Nonspecific inferolateral ST segment changes.  LABS:  BMP Latest Ref Rng & Units 03/22/2018 03/09/2018 09/08/2017  Glucose 65 - 99 mg/dL 128(H) 129 143(H)  BUN 8 - 27 mg/dL 28(H) 32(H) 26.9(H)  Creatinine 0.57 - 1.00 mg/dL 1.42(H) 1.57(H) 1.4(H)  BUN/Creat Ratio 12 - 28 20 - -  Sodium 134 - 144 mmol/L 140 140 140  Potassium 3.5 - 5.2 mmol/L 5.0 4.3 4.0  Chloride 96 - 106 mmol/L 102 105 -  CO2 20 - 29 mmol/L 21 25 24   Calcium 8.7 - 10.3 mg/dL 9.9 9.5 9.5   Hepatic Function Latest Ref Rng & Units 03/22/2018 03/09/2018 09/08/2017  Total Protein 6.0 - 8.5 g/dL 7.1 7.5 7.0  Albumin 3.5 - 4.8 g/dL 4.9(H) 4.5 4.2  AST 0 - 40 IU/L 16 17 19   ALT 0 - 32 IU/L 20 22 23   Alk Phosphatase 39 - 117 IU/L 46 48 44  Total Bilirubin 0.0 - 1.2 mg/dL 1.0 1.0 0.65   CBC Latest Ref Rng & Units 03/22/2018 03/09/2018 09/08/2017  WBC 3.4 - 10.8 x10E3/uL 10.0 9.9 11.3(H)  Hemoglobin 11.1 - 15.9 g/dL 13.5 13.3 13.2  Hematocrit 34.0 - 46.6 % 40.4 41.8 41.0  Platelets 150 - 379 x10E3/uL 372 356 350   Lab Results  Component Value Date   MCV 84 03/22/2018   MCV 88.2 03/09/2018   MCV 86.2 09/08/2017   Lab Results  Component Value Date   TSH 0.543 03/22/2018  No results found for: HGBA1C   Lipid Panel     Component Value Date/Time   CHOL 136 03/22/2018 0957   TRIG 151 (H) 03/22/2018 0957   HDL 31 (L) 03/22/2018 0957   CHOLHDL 4.4 03/22/2018 0957   CHOLHDL 4.7  05/30/2015 0932   VLDL 22 05/30/2015 0932   LDLCALC 75 03/22/2018 0957    RADIOLOGY: No results found.   IMPRESSION:  1. Chronic atrial fibrillation ([place])   2. NICM (nonischemic cardiomyopathy) ([place])   3. Essential hypertension   4. CKD (chronic kidney disease), stage III ([place])   5. Hypothyroidism, unspecified type   6. Controlled type 2 diabetes mellitus without complication, without long-term current use of insulin ([place])   7. Mild pulmonary hypertension ([place])   8. Poor sleep      ASSESSMENT AND PLAN: Ms. Coghill is a 77 year old female who has a history of permanent atrial fibrillation for  20 years. She has not been on anticoagulation due to history of frequent nosebleeds in the past requiring cauterization.   She is aware of potential for CVA risk with atrial fibrillation compounding over the years.  She continues to be adamant against initiating anticoagulation, whether with warfarin or with the newer agents.  She is on aspirin.  She has a history of renal stones in the past.  She also has CML and has been undergoing treatment.  When I saw her 2 months ago she was experiencing shortness of breath this had improved from previously.  Her ECG today shows atrial fibrillation with ventricular rate around 90.  I reviewed recent laboratory.  Her TSH was 0.543 on her current dose of levothyroxine 150 mcg for hypothyroidism.  She has chronic kidney disease, stage III and most recent creatinine was 1.42, improved from 1.57.  Her hemoglobin and hematocrit are stable.  I reviewed her most recent echo Doppler study.  This shows improvement from her prior assessment of LV function and her ejection fraction is now 45 to 50%.  Reviewed her  echo Doppler study details.  Her blood pressure today is controlled on spironolactone 12.5 mg twice a day.  There is no edema and her blood pressure is better with increase spironolactone at her last office visit.  She takes furosemide as needed if she notes weight  gain.  She continues to be on digoxin 0.125 weekly and is on Toprol-XL 200 mg daily for rate control.     Time spent: 25 minutes Troy Sine, MD, [facility] 03/30/2018 7:22 PM

## 2018-03-30 ENCOUNTER — Encounter: Payer: Self-pay | Admitting: Cardiovascular Disease

## 2018-03-31 ENCOUNTER — Telehealth: Payer: Self-pay | Admitting: Pharmacy Technician

## 2018-03-31 NOTE — Telephone Encounter (Signed)
Oral Oncology Patient Advocate Encounter  Patient called with concerns her supply of Tasigna (stated she had a 2 day supply remaining).  She had contacted [employer] to obtain a refill but was told that it would be "several days" until her medication arrived.   I contacted [employer] to inquire if the medication shipment could be marked urgent.   The [employer] representative confirmed that they had all the information that they needed and that a refill was currently being processed.  She also stated that if [facility] contacted them by 1pm today, they would be able to overnight her shipment.  Her medication refill would arrive tomorrow 04/01/2018.   I contacted Mrs Heitmeyer with this information.  She is going to contact them this morning.   [name]. [name], [place] Patient [place] [phone] 03/31/2018 10:36 AM

## 2018-04-06 ENCOUNTER — Encounter: Payer: Self-pay | Admitting: Sports Medicine

## 2018-04-06 ENCOUNTER — Ambulatory Visit: Payer: PPO | Admitting: Sports Medicine

## 2018-04-06 DIAGNOSIS — M2142 Flat foot [pes planus] (acquired), left foot: Secondary | ICD-10-CM

## 2018-04-06 DIAGNOSIS — E1142 Type 2 diabetes mellitus with diabetic polyneuropathy: Secondary | ICD-10-CM

## 2018-04-06 DIAGNOSIS — M2141 Flat foot [pes planus] (acquired), right foot: Secondary | ICD-10-CM

## 2018-04-06 DIAGNOSIS — M21619 Bunion of unspecified foot: Secondary | ICD-10-CM | POA: Diagnosis not present

## 2018-04-06 DIAGNOSIS — M79675 Pain in left toe(s): Secondary | ICD-10-CM | POA: Diagnosis not present

## 2018-04-06 DIAGNOSIS — L84 Corns and callosities: Secondary | ICD-10-CM

## 2018-04-06 DIAGNOSIS — M79674 Pain in right toe(s): Secondary | ICD-10-CM | POA: Diagnosis not present

## 2018-04-06 DIAGNOSIS — B351 Tinea unguium: Secondary | ICD-10-CM

## 2018-04-06 DIAGNOSIS — R2681 Unsteadiness on feet: Secondary | ICD-10-CM

## 2018-04-06 DIAGNOSIS — R29898 Other symptoms and signs involving the musculoskeletal system: Secondary | ICD-10-CM

## 2018-04-06 NOTE — Progress Notes (Signed)
Subjective: Taylore Hinde is a 77 y.o. female patient seen in office for evaluation of calluses bilateral and diabetic nail care.  Patient is also here for pickup of diabetic shoes.  Patient reports that is doing well and that the callus areas have remained dry and healed with no re-ulceration.  Patient states that she has no pain but does admit to her legs feeling weak and wants to do physical therapy. Patient has a history of diabetes and a blood glucose level not recorded today however earlier this week was 174 and reports that last A1c was around 7.  Saw her primary care last 2 months ago. Denies nausea/fever/vomiting/chills/night sweats/shortness of breath/pain. Patient has no other pedal complaints at this time.  Patient Active Problem List   Diagnosis Date Noted  . NICM (nonischemic cardiomyopathy) ([place])   . Chronic atrial fibrillation ([place])   . Hypertensive heart disease   . Stage III chronic kidney disease ([place]) 10/02/2015  . Controlled type 2 diabetes mellitus without complication ([place]) 16/08/9603  . CML (chronic myelocytic leukemia) ([place]) 01/10/2013  . Atrial fibrillation ([place]) 01/05/2012  . Hypothyroid 01/05/2012  . Cardiomyopathy ([place]) 01/05/2012   Current Outpatient Medications on File Prior to Visit  Medication Sig Dispense Refill  . clorazepate (TRANXENE-T) 7.5 MG tablet Take 1 tablet (7.5 mg total) by mouth 2 (two) times daily as needed for anxiety. 30 tablet 1  . digoxin (LANOXIN) 0.125 MG tablet Take 0.125 mg by mouth once a week. Takes on Monday only.    . furosemide (LASIX) 20 MG tablet Take 2 tablets in the morning ( 40 mg). Take 1 tablet (79m) as needed for 2 lb gain over 24 hours. 90 tablet 6  . glipiZIDE (GLUCOTROL XL) 10 MG 24 hr tablet Take 10 mg by mouth 2 (two) times daily.    . metFORMIN (GLUCOPHAGE-XR) 500 MG 24 hr tablet Take 1 tablet by mouth 2 (two) times daily.    . metoprolol (TOPROL-XL) 200 MG 24 hr tablet TAKE 1 TABLET BY MOUTH EVERY DAY 30 tablet 5  .  nilotinib (TASIGNA) 150 MG capsule Take 2 capsules (300 mg total) by mouth every 12 (twelve) hours. Take on an empty stomach, 1 hour before or 2 hours after meals. 112 capsule 0  . potassium chloride SA (K-DUR,KLOR-CON) 20 MEQ tablet Take 20 mEq by mouth daily.    . sitaGLIPtin (JANUVIA) 50 MG tablet Take 50 mg by mouth.     . spironolactone (ALDACTONE) 25 MG tablet Take 0.5 tablets (12.5 mg total) by mouth 2 (two) times daily. 90 tablet 3  . SYNTHROID 150 MCG tablet Take 150 mcg by mouth daily.  1   No current facility-administered medications on file prior to visit.    Allergies  Allergen Reactions  . Losartan Other (See Comments)    Patient reports nightmares/vivid dreams when taking.  . Lisinopril Cough    Recent Results (from the past 2160 hour(s))  Comprehensive metabolic panel     Status: Abnormal   Collection Time: 03/09/18 11:26 AM  Result Value Ref Range   Sodium 140 136 - 145 mmol/L   Potassium 4.3 3.5 - 5.1 mmol/L   Chloride 105 98 - 109 mmol/L   CO2 25 22 - 29 mmol/L   Glucose, Bld 129 70 - 140 mg/dL   BUN 32 (H) 7 - 26 mg/dL   Creatinine, Ser 1.57 (H) 0.60 - 1.10 mg/dL   Calcium 9.5 8.4 - 10.4 mg/dL   Total Protein 7.5 6.4 - 8.3  g/dL   Albumin 4.5 3.5 - 5.0 g/dL   AST 17 5 - 34 U/L   ALT 22 0 - 55 U/L   Alkaline Phosphatase 48 40 - 150 U/L   Total Bilirubin 1.0 0.2 - 1.2 mg/dL   GFR calc non Af Amer 31 (L) >60 mL/min   GFR calc Af Amer 36 (L) >60 mL/min    Comment: (NOTE) The eGFR has been calculated using the CKD EPI equation. This calculation has not been validated in all clinical situations. eGFR's persistently <60 mL/min signify possible Chronic Kidney Disease.    Anion gap 10 3 - 11    Comment: Performed at [facility], [address] [place], [place] [address]  CBC with Differential/Platelet     Status: Abnormal   Collection Time: 03/09/18 11:26 AM  Result Value Ref Range   WBC 9.9 3.9 - 10.3 K/uL   RBC 4.74 3.70 - 5.45  MIL/uL   Hemoglobin 13.3 11.6 - 15.9 g/dL   HCT 41.8 34.8 - 46.6 %   MCV 88.2 79.5 - 101.0 fL   MCH 28.1 25.1 - 34.0 pg   MCHC 31.8 31.5 - 36.0 g/dL   RDW 14.6 (H) 11.2 - 14.5 %   Platelets 356 145 - 400 K/uL   Neutrophils Relative % 56 %   Neutro Abs 5.6 1.5 - 6.5 K/uL   Lymphocytes Relative 33 %   Lymphs Abs 3.2 0.9 - 3.3 K/uL   Monocytes Relative 8 %   Monocytes Absolute 0.7 0.1 - 0.9 K/uL   Eosinophils Relative 2 %   Eosinophils Absolute 0.2 0.0 - 0.5 K/uL   Basophils Relative 1 %   Basophils Absolute 0.1 0.0 - 0.1 K/uL    Comment: Performed at [facility], [place] [address], [place], [place] [address]  BCR-ABL     Status: None   Collection Time: 03/09/18 11:31 AM  Result Value Ref Range   BCR ABL,PCR See Scanned report in Cool: Performed at [facility], [place] [address], [place], [place] [address]  Comprehensive metabolic panel     Status: Abnormal   Collection Time: 03/22/18  9:57 AM  Result Value Ref Range   Glucose 128 (H) 65 - 99 mg/dL   BUN 28 (H) 8 - 27 mg/dL   Creatinine, Ser 1.42 (H) 0.57 - 1.00 mg/dL   GFR calc non Af Amer 36 (L) >59 mL/min/1.73   GFR calc Af Amer 41 (L) >59 mL/min/1.73   BUN/Creatinine Ratio 20 12 - 28   Sodium 140 134 - 144 mmol/L   Potassium 5.0 3.5 - 5.2 mmol/L   Chloride 102 96 - 106 mmol/L   CO2 21 20 - 29 mmol/L   Calcium 9.9 8.7 - 10.3 mg/dL   Total Protein 7.1 6.0 - 8.5 g/dL   Albumin 4.9 (H) 3.5 - 4.8 g/dL   Globulin, Total 2.2 1.5 - 4.5 g/dL   Albumin/Globulin Ratio 2.2 1.2 - 2.2   Bilirubin Total 1.0 0.0 - 1.2 mg/dL   Alkaline Phosphatase 46 39 - 117 IU/L   AST 16 0 - 40 IU/L   ALT 20 0 - 32 IU/L  CBC     Status: None   Collection Time: 03/22/18  9:57 AM  Result Value Ref Range   WBC 10.0 3.4 - 10.8 x10E3/uL   RBC 4.82 3.77 - 5.28 x10E6/uL   Hemoglobin 13.5 11.1 - 15.9 g/dL  Hematocrit 40.4 34.0 - 46.6 %   MCV 84 79 - 97 fL   MCH 28.0 26.6 - 33.0 pg    MCHC 33.4 31.5 - 35.7 g/dL   RDW 14.7 12.3 - 15.4 %   Platelets 372 150 - 379 x10E3/uL  Lipid panel     Status: Abnormal   Collection Time: 03/22/18  9:57 AM  Result Value Ref Range   Cholesterol, Total 136 100 - 199 mg/dL   Triglycerides 151 (H) 0 - 149 mg/dL   HDL 31 (L) >39 mg/dL   VLDL Cholesterol Cal 30 5 - 40 mg/dL   LDL Calculated 75 0 - 99 mg/dL   Chol/HDL Ratio 4.4 0.0 - 4.4 ratio    Comment:                                   T. Chol/HDL Ratio                                             Men  Women                               1/2 Avg.Risk  3.4    3.3                                   Avg.Risk  5.0    4.4                                2X Avg.Risk  9.6    7.1                                3X Avg.Risk 23.4   11.0   TSH     Status: None   Collection Time: 03/22/18  9:57 AM  Result Value Ref Range   TSH 0.543 0.450 - 4.500 uIU/mL    Objective: There were no vitals filed for this visit.  General: Patient is awake, alert, oriented x 3 and in no acute distress.  Integument: Skin is warm, dry and supple bilateral. Nails are  mildly elongated thickened and  dystrophic with subungual debris, consistent with onychomycosis, 1-5 bilateral. No signs of infection.+ preulcerative lesion present sub met 1 on left and right that is callused over. Remaining integument unremarkable.  Vasculature:  Dorsalis Pedis pulse 1/4 bilateral. Posterior Tibial pulse  1/4 bilateral.  Capillary fill time <3 sec 1-5 bilateral.  Scant hair growth to the level of the digits. Temperature gradient within normal limits.  Mild varicosities present bilateral.  Trace edema and mild hyperpigmentation present bilateral.   Neurology: The patient has diminished sensation measured with a 5.07/10g Semmes Weinstein Monofilament at all pedal sites bilateral. Vibratory sensation diminished bilateral with tuning fork. No Babinski sign present bilateral.   Musculoskeletal: Asymptomatic bunion and hammertoe and  pes planus pedal deformities noted bilateral. Muscular strength 4/5 in all lower extremity muscular groups bilateral without pain on range of motion .  No pain sub-met one bilateral.  No tenderness with calf compression bilateral.  Assessment and Plan:  Problem List Items Addressed This Visit    None    Visit Diagnoses    DM type 2 with diabetic peripheral neuropathy ([place])    -  Primary   Bunion       Pes planus of both feet       Pre-ulcerative calluses       Gait instability       Weakness of both lower extremities       Pain due to onychomycosis of toenails of both feet         -Examined patient -Mechanically debrided nails x10 using a sterile nail nipper and smooth with dermal without incident.  Complementary at no charge trimmed reactive callus sub-met one bilaterally using a sterile chisel blade without incident. -Dispensed diabetic shoes with wear instructions.  Patient noted to be satisfied and report comfort when she tried on her shoes today in office -Prescribed physical therapy at [facility] for gait instability and lower extremity weakness which patient subjectively has been dealing with over the last several months -Patient to continue with daily foot inspection in the setting of diabetes -Patient to return in 10 weeks for diabetic nail and callus care.  Landis Martins, DPM

## 2018-04-13 DIAGNOSIS — M25552 Pain in left hip: Secondary | ICD-10-CM | POA: Diagnosis not present

## 2018-04-13 DIAGNOSIS — R262 Difficulty in walking, not elsewhere classified: Secondary | ICD-10-CM | POA: Diagnosis not present

## 2018-04-13 DIAGNOSIS — R2689 Other abnormalities of gait and mobility: Secondary | ICD-10-CM | POA: Diagnosis not present

## 2018-04-13 DIAGNOSIS — M6281 Muscle weakness (generalized): Secondary | ICD-10-CM | POA: Diagnosis not present

## 2018-04-15 DIAGNOSIS — M25552 Pain in left hip: Secondary | ICD-10-CM | POA: Diagnosis not present

## 2018-04-15 DIAGNOSIS — R262 Difficulty in walking, not elsewhere classified: Secondary | ICD-10-CM | POA: Diagnosis not present

## 2018-04-15 DIAGNOSIS — R2689 Other abnormalities of gait and mobility: Secondary | ICD-10-CM | POA: Diagnosis not present

## 2018-04-15 DIAGNOSIS — M6281 Muscle weakness (generalized): Secondary | ICD-10-CM | POA: Diagnosis not present

## 2018-04-27 ENCOUNTER — Other Ambulatory Visit: Payer: Self-pay | Admitting: *Deleted

## 2018-04-27 DIAGNOSIS — C9210 Chronic myeloid leukemia, BCR/ABL-positive, not having achieved remission: Secondary | ICD-10-CM

## 2018-04-27 MED ORDER — NILOTINIB HCL 150 MG PO CAPS: 300.0000 mg | ORAL_CAPSULE | Freq: Two times a day (BID) | ORAL | 0 refills | Status: DC

## 2018-05-16 ENCOUNTER — Other Ambulatory Visit: Payer: Self-pay | Admitting: *Deleted

## 2018-05-16 DIAGNOSIS — C9210 Chronic myeloid leukemia, BCR/ABL-positive, not having achieved remission: Secondary | ICD-10-CM

## 2018-05-16 MED ORDER — NILOTINIB HCL 150 MG PO CAPS: 300.0000 mg | ORAL_CAPSULE | Freq: Two times a day (BID) | ORAL | 0 refills | Status: DC

## 2018-05-19 ENCOUNTER — Other Ambulatory Visit: Payer: Self-pay | Admitting: Cardiovascular Disease

## 2018-05-23 DIAGNOSIS — F419 Anxiety disorder, unspecified: Secondary | ICD-10-CM | POA: Diagnosis not present

## 2018-05-23 DIAGNOSIS — I4891 Unspecified atrial fibrillation: Secondary | ICD-10-CM | POA: Diagnosis not present

## 2018-05-23 DIAGNOSIS — Z23 Encounter for immunization: Secondary | ICD-10-CM | POA: Diagnosis not present

## 2018-05-23 DIAGNOSIS — E890 Postprocedural hypothyroidism: Secondary | ICD-10-CM | POA: Diagnosis not present

## 2018-05-23 DIAGNOSIS — E119 Type 2 diabetes mellitus without complications: Secondary | ICD-10-CM | POA: Diagnosis not present

## 2018-05-23 DIAGNOSIS — Z79899 Other long term (current) drug therapy: Secondary | ICD-10-CM | POA: Diagnosis not present

## 2018-05-23 DIAGNOSIS — I10 Essential (primary) hypertension: Secondary | ICD-10-CM | POA: Diagnosis not present

## 2018-05-23 DIAGNOSIS — Z6831 Body mass index (BMI) 31.0-31.9, adult: Secondary | ICD-10-CM | POA: Diagnosis not present

## 2018-06-08 ENCOUNTER — Encounter: Payer: Self-pay | Admitting: Sports Medicine

## 2018-06-08 ENCOUNTER — Ambulatory Visit (INDEPENDENT_AMBULATORY_CARE_PROVIDER_SITE_OTHER): Payer: PPO | Admitting: Sports Medicine

## 2018-06-08 VITALS — BP 148/87 | HR 109 | Temp 98.6°F | Resp 16

## 2018-06-08 DIAGNOSIS — B351 Tinea unguium: Secondary | ICD-10-CM | POA: Diagnosis not present

## 2018-06-08 DIAGNOSIS — M79675 Pain in left toe(s): Secondary | ICD-10-CM | POA: Diagnosis not present

## 2018-06-08 DIAGNOSIS — L84 Corns and callosities: Secondary | ICD-10-CM

## 2018-06-08 DIAGNOSIS — E1142 Type 2 diabetes mellitus with diabetic polyneuropathy: Secondary | ICD-10-CM | POA: Diagnosis not present

## 2018-06-08 DIAGNOSIS — R29898 Other symptoms and signs involving the musculoskeletal system: Secondary | ICD-10-CM

## 2018-06-08 DIAGNOSIS — M79674 Pain in right toe(s): Secondary | ICD-10-CM | POA: Diagnosis not present

## 2018-06-08 NOTE — Progress Notes (Signed)
Subjective: Sara Warner is a 77 y.o. female patient seen in office for evaluation of calluses bilateral and diabetic nail care.  Patient states that she stopped PT because her legs were hurting more with the exercises. Patient has a history of diabetes and a blood glucose level not recorded today however earlier this week was 130 and reports that last A1c was 6.  Saw her primary care last 2 weeks ago. Denies nausea/fever/vomiting/chills/night sweats/shortness of breath/pain except at left upper leg that is better. Patient has no other pedal complaints at this time.  Patient Active Problem List   Diagnosis Date Noted  . NICM (nonischemic cardiomyopathy) ([place])   . Chronic atrial fibrillation ([place])   . Hypertensive heart disease   . Stage III chronic kidney disease ([place]) 10/02/2015  . Controlled type 2 diabetes mellitus without complication ([place]) 80/99/8338  . CML (chronic myelocytic leukemia) ([place]) 01/10/2013  . Atrial fibrillation ([place]) 01/05/2012  . Hypothyroid 01/05/2012  . Cardiomyopathy ([place]) 01/05/2012   Current Outpatient Medications on File Prior to Visit  Medication Sig Dispense Refill  . clorazepate (TRANXENE-T) 7.5 MG tablet Take 1 tablet (7.5 mg total) by mouth 2 (two) times daily as needed for anxiety. 30 tablet 1  . digoxin (LANOXIN) 0.125 MG tablet Take 0.125 mg by mouth once a week. Takes on Monday only.    . furosemide (LASIX) 20 MG tablet Take 2 tablets (40m) in the morning and an extra tablet (229m if gain 3 or more pounds in one day. 270 tablet 2  . glipiZIDE (GLUCOTROL XL) 10 MG 24 hr tablet Take 10 mg by mouth 2 (two) times daily.    . metFORMIN (GLUCOPHAGE-XR) 500 MG 24 hr tablet Take 1 tablet by mouth 2 (two) times daily.    . metoprolol (TOPROL-XL) 200 MG 24 hr tablet TAKE 1 TABLET BY MOUTH EVERY DAY 30 tablet 5  . nilotinib (TASIGNA) 150 MG capsule Take 2 capsules (300 mg total) by mouth every 12 (twelve) hours. Take on an empty stomach, 1 hour before or 2 hours  after meals. 120 capsule 0  . potassium chloride SA (K-DUR,KLOR-CON) 20 MEQ tablet Take 20 mEq by mouth daily.    . sitaGLIPtin (JANUVIA) 50 MG tablet Take 50 mg by mouth.     . spironolactone (ALDACTONE) 25 MG tablet Take 0.5 tablets (12.5 mg total) by mouth 2 (two) times daily. 90 tablet 3  . SYNTHROID 150 MCG tablet Take 150 mcg by mouth daily.  1   No current facility-administered medications on file prior to visit.    Allergies  Allergen Reactions  . Losartan Other (See Comments)    Patient reports nightmares/vivid dreams when taking.  . Lisinopril Cough    Recent Results (from the past 2160 hour(s))  Comprehensive metabolic panel     Status: Abnormal   Collection Time: 03/22/18  9:57 AM  Result Value Ref Range   Glucose 128 (H) 65 - 99 mg/dL   BUN 28 (H) 8 - 27 mg/dL   Creatinine, Ser 1.42 (H) 0.57 - 1.00 mg/dL   GFR calc non Af Amer 36 (L) >59 mL/min/1.73   GFR calc Af Amer 41 (L) >59 mL/min/1.73   BUN/Creatinine Ratio 20 12 - 28   Sodium 140 134 - 144 mmol/L   Potassium 5.0 3.5 - 5.2 mmol/L   Chloride 102 96 - 106 mmol/L   CO2 21 20 - 29 mmol/L   Calcium 9.9 8.7 - 10.3 mg/dL   Total Protein 7.1 6.0 - 8.5  g/dL   Albumin 4.9 (H) 3.5 - 4.8 g/dL   Globulin, Total 2.2 1.5 - 4.5 g/dL   Albumin/Globulin Ratio 2.2 1.2 - 2.2   Bilirubin Total 1.0 0.0 - 1.2 mg/dL   Alkaline Phosphatase 46 39 - 117 IU/L   AST 16 0 - 40 IU/L   ALT 20 0 - 32 IU/L  CBC     Status: None   Collection Time: 03/22/18  9:57 AM  Result Value Ref Range   WBC 10.0 3.4 - 10.8 x10E3/uL   RBC 4.82 3.77 - 5.28 x10E6/uL   Hemoglobin 13.5 11.1 - 15.9 g/dL   Hematocrit 40.4 34.0 - 46.6 %   MCV 84 79 - 97 fL   MCH 28.0 26.6 - 33.0 pg   MCHC 33.4 31.5 - 35.7 g/dL   RDW 14.7 12.3 - 15.4 %   Platelets 372 150 - 379 x10E3/uL  Lipid panel     Status: Abnormal   Collection Time: 03/22/18  9:57 AM  Result Value Ref Range   Cholesterol, Total 136 100 - 199 mg/dL   Triglycerides 151 (H) 0 - 149 mg/dL   HDL 31  (L) >39 mg/dL   VLDL Cholesterol Cal 30 5 - 40 mg/dL   LDL Calculated 75 0 - 99 mg/dL   Chol/HDL Ratio 4.4 0.0 - 4.4 ratio    Comment:                                   T. Chol/HDL Ratio                                             Men  Women                               1/2 Avg.Risk  3.4    3.3                                   Avg.Risk  5.0    4.4                                2X Avg.Risk  9.6    7.1                                3X Avg.Risk 23.4   11.0   TSH     Status: None   Collection Time: 03/22/18  9:57 AM  Result Value Ref Range   TSH 0.543 0.450 - 4.500 uIU/mL    Objective: There were no vitals filed for this visit.  General: Patient is awake, alert, oriented x 3 and in no acute distress.  Integument: Skin is warm, dry and supple bilateral. Nails are  mildly elongated thickened and  dystrophic with subungual debris, consistent with onychomycosis, 1-5 bilateral. No signs of infection.+ preulcerative lesion present sub met 1 on left and right that is callused over. Remaining integument unremarkable.  Vasculature:  Dorsalis Pedis pulse 1/4 bilateral. Posterior Tibial pulse  1/4 bilateral.  Capillary fill time <3 sec 1-5 bilateral.  Scant  hair growth to the level of the digits. Temperature gradient within normal limits.  Mild varicosities present bilateral.  Trace edema and mild hyperpigmentation present bilateral.   Neurology: The patient has diminished sensation measured with a 5.07/10g Semmes Weinstein Monofilament at all pedal sites bilateral. Vibratory sensation diminished bilateral with tuning fork. No Babinski sign present bilateral.   Musculoskeletal: Asymptomatic bunion and hammertoe and pes planus pedal deformities noted bilateral. Muscular strength 4/5 in all lower extremity muscular groups bilateral without pain on range of motion .  No pain sub-met one bilateral.  No tenderness with calf compression bilateral.  Assessment and Plan:  Problem List Items  Addressed This Visit    None    Visit Diagnoses    DM type 2 with diabetic peripheral neuropathy ([place])    -  Primary   Pain due to onychomycosis of toenails of both feet       Pre-ulcerative calluses       Weakness of both lower extremities         -Examined patient -Mechanically debrided nails x10 using a sterile nail nipper and smooth with dermal without incident.  Complementary at no charge trimmed reactive callus sub-met one bilaterally using a sterile chisel blade without incident. -Continue with diabetic shoes  -PT stopped due to increased pain to legs with exercises -Patient to continue with daily foot inspection in the setting of diabetes -Patient to return in 9-10 weeks for diabetic nail and callus care.  Landis Martins, DPM

## 2018-06-13 ENCOUNTER — Other Ambulatory Visit: Payer: Self-pay | Admitting: *Deleted

## 2018-06-13 DIAGNOSIS — C9210 Chronic myeloid leukemia, BCR/ABL-positive, not having achieved remission: Secondary | ICD-10-CM

## 2018-06-13 MED ORDER — NILOTINIB HCL 150 MG PO CAPS
300.0000 mg | ORAL_CAPSULE | Freq: Two times a day (BID) | ORAL | 0 refills | Status: DC
Start: 2018-06-13 — End: 2018-07-21

## 2018-06-22 ENCOUNTER — Other Ambulatory Visit: Payer: Self-pay | Admitting: Cardiovascular Disease

## 2018-06-22 DIAGNOSIS — N183 Chronic kidney disease, stage 3 (moderate): Secondary | ICD-10-CM | POA: Diagnosis not present

## 2018-06-22 NOTE — Telephone Encounter (Signed)
Rx sent to pharmacy   

## 2018-07-21 ENCOUNTER — Other Ambulatory Visit: Payer: Self-pay | Admitting: *Deleted

## 2018-07-21 DIAGNOSIS — C9210 Chronic myeloid leukemia, BCR/ABL-positive, not having achieved remission: Secondary | ICD-10-CM

## 2018-07-21 MED ORDER — NILOTINIB HCL 150 MG PO CAPS: 300.0000 mg | ORAL_CAPSULE | Freq: Two times a day (BID) | ORAL | 0 refills | Status: DC

## 2018-07-26 ENCOUNTER — Other Ambulatory Visit: Payer: Self-pay | Admitting: *Deleted

## 2018-07-26 NOTE — Patient Outreach (Signed)
[place] [facility]) Care Management  07/26/2018  Chaselynn Kepple [birth year] [id number]  Referral via [facility]: Reason: Member is requesting assistance due to being in coverage gap.  Telephone call to patient who was advised of reason for call and of [facility] care management services.  HIPPA verification received.  Patient voices that she is on Januvia 50mg  daily. States current cost is $316 for 3 months and previously she was paying $90 for 3 months. Voices that she could not afford the gap cost. States MD office was able to provide 3 month supply of samples through October.  States she does not know if samples will be available when she completes taking what she has on hand.   States she checks blood sugars several times throughout the day. Most recent blood sugars range from 258-268. States she does have neuropathy and legs are weak at times. No falls this year. States she has cane if needed. Voices she has never attended Diabetes Classes.  Recommendations for referral to [facility] Pharmacist for medication assistance & [place] for disease management. Patient consents to services.  Plan: Referral to [place].  Sherrin Daisy, RN BSN [place] Management Coordinator [facility] Care Management  [phone]

## 2018-07-27 ENCOUNTER — Other Ambulatory Visit: Payer: Self-pay | Admitting: Pharmacy Technician

## 2018-07-27 ENCOUNTER — Telehealth: Payer: Self-pay | Admitting: Pharmacist

## 2018-07-27 NOTE — Patient Outreach (Signed)
[facility]) Care Management  [place]   07/27/2018  Sara Warner [birth year] [id number]  Subjective: Patient was called regarding medication assistance with Januvia. HIPAA identifiers were obtained. Patient is a 77 year old female with multiple medical conditions including but not limited to:  A fib, cardiomyopathy, CML, type 2 diabetes HTN and CKD stage III.  Objective:  eGfr 36 ml/mim TG 151 LDL 75  Encounter Medications: Outpatient Encounter Medications as of 07/27/2018  Medication Sig Note  . clorazepate (TRANXENE-T) 7.5 MG tablet Take 1 tablet (7.5 mg total) by mouth 2 (two) times daily as needed for anxiety.   . digoxin (LANOXIN) 0.125 MG tablet Take 0.125 mg by mouth once a week. Takes on Monday only.   . furosemide (LASIX) 20 MG tablet Take 2 tablets (26m) in the morning and an extra tablet (267m if gain 3 or more pounds in one day. (Patient taking differently: 20 mg 2 (two) times daily. Take 2 tablets (4064min the morning and an extra tablet (60m59mf gain 3 or more pounds in one day.)   . glipiZIDE (GLUCOTROL XL) 10 MG 24 hr tablet Take 10 mg by mouth 2 (two) times daily.   . metFORMIN (GLUCOPHAGE-XR) 500 MG 24 hr tablet Take 1 tablet by mouth 2 (two) times daily.   . metoprolol (TOPROL-XL) 200 MG 24 hr tablet TAKE 1 TABLET BY MOUTH EVERY DAY   . nilotinib (TASIGNA) 150 MG capsule Take 2 capsules (300 mg total) by mouth every 12 (twelve) hours. Take on an empty stomach, 1 hour before or 2 hours after meals.   . potassium chloride SA (K-DUR,KLOR-CON) 20 MEQ tablet Take 20 mEq by mouth daily.   . sitaGLIPtin (JANUVIA) 50 MG tablet Take 50 mg by mouth.    . spironolactone (ALDACTONE) 25 MG tablet Take 0.5 tablets (12.5 mg total) by mouth 2 (two) times daily. 07/27/2018: PRN-fluid/SOB  . SYNTHROID 150 MCG tablet Take 150 mcg by mouth daily.    No facility-administered encounter medications on file as of 07/27/2018.      Assessment: Patient's  medications were reviewed via telephone.   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  -Follow up with patient in 2 weeks.   KatiElayne GuerinarmD, BCACAngelsnical Pharmacist ([phone]

## 2018-07-27 NOTE — Patient Outreach (Signed)
[place] [facility]) Care Management  07/27/2018  Anhthu Perdew 08/20/1941 [id number]   Received [name] patient assistance referral from [place] for [place]. Prepared patient portion to be mailed and prepared provider portion to be mailed to N. Tobie Lords.  Will follow up with patient in 5-7 business days to confirm application has been received.  [name] [place], [place] Management [phone]

## 2018-07-29 ENCOUNTER — Encounter: Payer: Self-pay | Admitting: *Deleted

## 2018-08-03 DIAGNOSIS — R609 Edema, unspecified: Secondary | ICD-10-CM | POA: Diagnosis not present

## 2018-08-03 DIAGNOSIS — L568 Other specified acute skin changes due to ultraviolet radiation: Secondary | ICD-10-CM | POA: Diagnosis not present

## 2018-08-10 ENCOUNTER — Other Ambulatory Visit: Payer: Self-pay | Admitting: *Deleted

## 2018-08-10 ENCOUNTER — Other Ambulatory Visit: Payer: Self-pay | Admitting: Pharmacist

## 2018-08-10 NOTE — Patient Outreach (Signed)
[facility]) Care Management  08/10/2018  Sara Warner Jan 30, 1941 [id number]   Patient was called to follow up on medication assistance forms for [name]. HIPAA identifiers were obtained.  Patient confirmed she received the forms and reported putting the forms in the mail yesterday.     Patient was educated about the "Attestation Form" that [name] sends back with the entire application attached.  Plan: Route note to [name] Certified Pharmacy Technician, Etter Sjogren for follow up   Elayne Guerin, PharmD, [place] Clinical Pharmacist [phone]  .

## 2018-08-10 NOTE — Patient Outreach (Signed)
[place] [facility]) Care Management  08/10/2018  Sara Warner 1941/05/18 [id number]   [place] Introduction Call  Referral Date:  07/27/2018 Referral Source:  HTA Concierge Reason for Referral:  Medication Assistance and Disease Management Education Insurance:  [employer]   Outreach Attempt:  Successful telephone outreach to patient for introductory call.  HIPAA verified with patient.   RN Health Coach introduced self and role.  Patient verbally agrees to Disease Management outreaches.  Plan:  RN Health Coach will make outreach to complete initial telephone assessment within the next 15 business days.   [place] Coach [phone] Sara [email or web address]

## 2018-08-11 ENCOUNTER — Ambulatory Visit (INDEPENDENT_AMBULATORY_CARE_PROVIDER_SITE_OTHER): Payer: PPO | Admitting: Sports Medicine

## 2018-08-11 ENCOUNTER — Encounter: Payer: Self-pay | Admitting: Sports Medicine

## 2018-08-11 VITALS — BP 127/81 | HR 90 | Resp 15

## 2018-08-11 DIAGNOSIS — M79674 Pain in right toe(s): Secondary | ICD-10-CM

## 2018-08-11 DIAGNOSIS — E1142 Type 2 diabetes mellitus with diabetic polyneuropathy: Secondary | ICD-10-CM

## 2018-08-11 DIAGNOSIS — M79675 Pain in left toe(s): Secondary | ICD-10-CM

## 2018-08-11 DIAGNOSIS — M79671 Pain in right foot: Secondary | ICD-10-CM

## 2018-08-11 DIAGNOSIS — L84 Corns and callosities: Secondary | ICD-10-CM

## 2018-08-11 DIAGNOSIS — M79672 Pain in left foot: Secondary | ICD-10-CM

## 2018-08-11 DIAGNOSIS — B351 Tinea unguium: Secondary | ICD-10-CM | POA: Diagnosis not present

## 2018-08-11 LAB — HM DIABETES FOOT EXAM

## 2018-08-11 NOTE — Progress Notes (Signed)
Subjective: Sara Warner is a 77 y.o. female patient seen in office for evaluation of calluses bilateral and diabetic nail care.  Patient states that her blood sugar several days ago was 200 and her last A1c was under 6 she saw her primary care doctor 1 week ago and reports that she is doing okay sometimes has some weakness but otherwise she is doing okay.  Denies any other constitutional symptoms at this time.  Patient Active Problem List   Diagnosis Date Noted  . NICM (nonischemic cardiomyopathy) ([place])   . Chronic atrial fibrillation ([place])   . Hypertensive heart disease   . Stage III chronic kidney disease ([place]) 10/02/2015  . Controlled type 2 diabetes mellitus without complication ([place]) 88/50/2774  . CML (chronic myelocytic leukemia) ([place]) 01/10/2013  . Atrial fibrillation ([place]) 01/05/2012  . Hypothyroid 01/05/2012  . Cardiomyopathy ([place]) 01/05/2012   Current Outpatient Medications on File Prior to Visit  Medication Sig Dispense Refill  . clorazepate (TRANXENE-T) 7.5 MG tablet Take 1 tablet (7.5 mg total) by mouth 2 (two) times daily as needed for anxiety. 30 tablet 1  . digoxin (LANOXIN) 0.125 MG tablet Take 0.125 mg by mouth once a week. Takes on Monday only.    . furosemide (LASIX) 20 MG tablet Take 2 tablets (29m) in the morning and an extra tablet (221m if gain 3 or more pounds in one day. (Patient taking differently: 20 mg 2 (two) times daily. Take 2 tablets (408min the morning and an extra tablet (3m74mf gain 3 or more pounds in one day.) 270 tablet 2  . glipiZIDE (GLUCOTROL XL) 10 MG 24 hr tablet Take 10 mg by mouth 2 (two) times daily.    . metFORMIN (GLUCOPHAGE-XR) 500 MG 24 hr tablet Take 1 tablet by mouth 2 (two) times daily.    . metoprolol (TOPROL-XL) 200 MG 24 hr tablet TAKE 1 TABLET BY MOUTH EVERY DAY 90 tablet 1  . nilotinib (TASIGNA) 150 MG capsule Take 2 capsules (300 mg total) by mouth every 12 (twelve) hours. Take on an empty stomach, 1 hour before or 2 hours  after meals. 120 capsule 0  . potassium chloride SA (K-DUR,KLOR-CON) 20 MEQ tablet Take 20 mEq by mouth daily.    . sitaGLIPtin (JANUVIA) 50 MG tablet Take 50 mg by mouth.     . spironolactone (ALDACTONE) 25 MG tablet Take 0.5 tablets (12.5 mg total) by mouth 2 (two) times daily. 90 tablet 3  . SYNTHROID 150 MCG tablet Take 150 mcg by mouth daily.  1   No current facility-administered medications on file prior to visit.    Allergies  Allergen Reactions  . Losartan Other (See Comments)    Patient reports nightmares/vivid dreams when taking.  . Lisinopril Cough    No results found for this or any previous visit (from the past 2160 hour(s)).  Objective: There were no vitals filed for this visit.  General: Patient is awake, alert, oriented x 3 and in no acute distress.  Integument: Skin is warm, dry and supple bilateral. Nails are  mildly elongated thickened and  dystrophic with subungual debris, consistent with onychomycosis, 1-5 bilateral. No signs of infection.+ preulcerative lesion present sub met 1 on left and right that is callused over with reactive tissue and no underlying opening. Remaining integument unremarkable.  Vasculature:  Dorsalis Pedis pulse 1/4 bilateral. Posterior Tibial pulse  1/4 bilateral.  Capillary fill time <3 sec 1-5 bilateral.  Scant hair growth to the level of the digits. Temperature  gradient within normal limits.  Mild varicosities present bilateral.  Trace edema and mild hyperpigmentation present bilateral.   Neurology: The patient has diminished sensation measured with a 5.07/10g Semmes Weinstein Monofilament at all pedal sites bilateral. Vibratory sensation diminished bilateral with tuning fork. No Babinski sign present bilateral.   Musculoskeletal: Asymptomatic bunion and hammertoe and pes planus pedal deformities noted bilateral. Muscular strength 4/5 in all lower extremity muscular groups bilateral without pain on range of motion .  No pain sub-met  one bilateral.  No tenderness with calf compression bilateral.  Assessment and Plan:  Problem List Items Addressed This Visit    None    Visit Diagnoses    DM type 2 with diabetic peripheral neuropathy ([place])    -  Primary   Pain due to onychomycosis of toenails of both feet       Pre-ulcerative calluses       Foot pain, bilateral         -Examined patient -Mechanically debrided nails x10 using a sterile nail nipper and smooth with dermal without incident.  Complementary at no charge trimmed reactive callus sub-met one bilaterally using a sterile chisel blade without incident. -Continue with diabetic shoes  -Patient to continue with daily foot inspection in the setting of diabetes as previous recommended -Patient to return in 9-10 weeks for diabetic nail and callus care.  Landis Martins, DPM

## 2018-08-15 ENCOUNTER — Other Ambulatory Visit: Payer: Self-pay | Admitting: Pharmacy Technician

## 2018-08-15 NOTE — Patient Outreach (Signed)
[place] [facility]) Care Management  08/15/2018  Sara Warner 19-Sep-1941 [id number]   Received provider portion and patient portion of [name] patient assistance application for Januvia. Prepared completed application to be mailed to company.  Will follow up with [name] in 10-14 business days to check status of application.  [name] [place], [place] Management [phone]

## 2018-08-19 ENCOUNTER — Other Ambulatory Visit: Payer: Self-pay | Admitting: *Deleted

## 2018-08-19 DIAGNOSIS — C9210 Chronic myeloid leukemia, BCR/ABL-positive, not having achieved remission: Secondary | ICD-10-CM

## 2018-08-19 MED ORDER — NILOTINIB HCL 150 MG PO CAPS: 300.0000 mg | ORAL_CAPSULE | Freq: Two times a day (BID) | ORAL | 0 refills | Status: DC

## 2018-08-22 ENCOUNTER — Other Ambulatory Visit: Payer: Self-pay | Admitting: *Deleted

## 2018-08-22 ENCOUNTER — Encounter: Payer: Self-pay | Admitting: *Deleted

## 2018-08-22 NOTE — Patient Outreach (Signed)
[facility]) Care Management  [place]  08/22/2018   Sara Warner Mar 23, 1941 [id number]   RN Health Coach Initial Assessment  Referral Date:  07/27/2018 Referral Source:  HTA Concierge Reason for Referral:  Medication Assistance and Disease Management Education Insurance:  [employer]   Outreach Attempt:  Successful telephone outreach to patient for initial telephone assessment.  HIPAA verified with patient.  Patient completed initial telephone assessment.  Social:  Patient lives at home alone.  Reports being independent with ADLs and daughters assisting with heavy house work/cleaning with IADLs.  Ambulates independently and occasionally with cane when feeling really weak; denies any falls in the past year.  Drives self to medical appointments.  DME in the home include:  Straight cane, Rolator walker, shower chair with back, wheelchair, blood pressure cuff, CBG meter, eyeglasses, grab bars in shower and around toilet, and scale.  Conditions:  Per chart review and discussion with patient, PMH include but not limited to:  Permanent atrial fibrillation with no anticoagulation related to recurrent epistaxis in the past, chronic systolic congestive heart failure, chronic kidney disease, chronic myelocytic leukemia, diabetes, hypertensive heart disease, hypothyroidism, and toxic goiter.  Patient denies any recent hospitalizations or emergency room visits.  Reports rarely monitoring her blood pressure or weights at home.  Dicussed importance of daily weight monitoring.  States she checks her blood sugars about 3 times a week and her fasting ranges have recently been elevated above 200's.  Last Hgb A1C was 6.6 in June 2019.  Medications:  Reports taking about 7 medications daily.  States she manages her medications with weekly pill box fill without difficulties and is working with [facility] Pharmacist for medication assistance.  Encounter Medications:  Outpatient  Encounter Medications as of 08/22/2018  Medication Sig Note  . clorazepate (TRANXENE-T) 7.5 MG tablet Take 1 tablet (7.5 mg total) by mouth 2 (two) times daily as needed for anxiety.   . digoxin (LANOXIN) 0.125 MG tablet Take 0.125 mg by mouth once a week. Takes on Monday only.   . furosemide (LASIX) 20 MG tablet Take 2 tablets (40mg ) in the morning and an extra tablet (20mg ) if gain 3 or more pounds in one day. (Patient taking differently: 20 mg 2 (two) times daily. Take 2 tablets (40mg ) in the morning and an extra tablet (20mg ) if gain 3 or more pounds in one day.)   . glipiZIDE (GLUCOTROL XL) 10 MG 24 hr tablet Take 10 mg by mouth 2 (two) times daily.   . metFORMIN (GLUCOPHAGE-XR) 500 MG 24 hr tablet Take 1 tablet by mouth 2 (two) times daily.   . metoprolol (TOPROL-XL) 200 MG 24 hr tablet TAKE 1 TABLET BY MOUTH EVERY DAY   . nilotinib (TASIGNA) 150 MG capsule Take 2 capsules (300 mg total) by mouth every 12 (twelve) hours. Take on an empty stomach, 1 hour before or 2 hours after meals.   . potassium chloride SA (K-DUR,KLOR-CON) 20 MEQ tablet Take 20 mEq by mouth daily.   . sitaGLIPtin (JANUVIA) 50 MG tablet Take 50 mg by mouth.    . spironolactone (ALDACTONE) 25 MG tablet Take 0.5 tablets (12.5 mg total) by mouth 2 (two) times daily. 07/27/2018: PRN-fluid/SOB  . SYNTHROID 150 MCG tablet Take 150 mcg by mouth daily.    No facility-administered encounter medications on file as of 08/22/2018.     Functional Status:  In your present state of health, do you have any difficulty performing the following activities: 08/22/2018  Hearing? N  Vision? N  Difficulty concentrating or making decisions? N  Walking or climbing stairs? Y  Comment trouble climbing stairs  Dressing or bathing? N  Doing errands, shopping? N  Preparing Food and eating ? N  Using the Toilet? N  In the past six months, have you accidently leaked urine? Y  Do you have problems with loss of bowel control? N  Managing your  Medications? N  Managing your Finances? N  Housekeeping or managing your Housekeeping? Y  Comment daughter assist with heavy house cleaning  Some recent data might be hidden    Fall/Depression Screening: Fall Risk  08/22/2018 07/26/2018  Falls in the past year? No No   PHQ 2/9 Scores 08/22/2018 07/26/2018  PHQ - 2 Score 0 0   THN CM Care Plan Problem One     Most Recent Value  Care Plan Problem One  Knowledge deficiet related to self care management of diabetes and heart failure.  Role Documenting the Problem One  [place] for Problem One  Active  THN Long Term Goal   Patient will maintain Hgb A1C below 7 in the next 90 days.  THN Long Term Goal Start Date  08/22/18  Interventions for Problem One Long Term Goal  Current care plan and goals reviewed and discussed with patient, encouraged to keep and attend medical appointments, reviewed current fasting blood sugar levels with patient and chances of increased Hgb A1C, encouraged healthier meals and drink options, reviewed medications and encouraged compliance  THN CM Short Term Goal #1   Patient will arrange appointment with eye doctor for yearly exam in the next 90 days.  THN CM Short Term Goal #1 Start Date  08/22/18  Interventions for Short Term Goal #1  Discussed with patient importance of yearly eye exams, discussed diabetic retinopathy, encouraged patient to make eye appointment as soon as possible  THN CM Short Term Goal #2   Patient will report monitoring her blood sugars at least daily in the next 90 days.  THN CM Short Term Goal #2 Start Date  08/22/18  Interventions for Short Term Goal #2  Sending Living Well with Diabetes Educational Booklet, reviewed signs and symptoms of hypo and hyperglycemia, reviewed and discussed importance of monitoring blood sugars, encouraged at least daily monitoring of blood sugars, discussed current Hgb A1C results and current fasting blood sugar ranges  THN CM Short Term Goal #3  Patient  will report monitoring daily weights in the next 90 days.  THN CM Short Term Goal #3 Start Date  08/22/18  Interventions for Short [place]ern Goal #3  Sending Living Better with Heart Failure Booklet, discussed importance of monitoring daily weights and when to call the physician based on weights, reviewed diuretic medications instructions to take extra dose based on weight, encouraged daily weighing, discussed when to weigh each day, reviewed signs and symptoms of heart failure     Appointments:  Patient last saw primary care provider, Cyndi Bender PA on 08/03/2018 and has next scheduled appointment in January 2020.  Encouraged patient to call to verify next appointment.  Attended appointment with Cardiologist, Dr. Claiborne Billings on 03/29/2018 and follows yearly.  Encouraged patient to schedule eye exam as soon as possible.  Advanced Directives:  Reports having a Press photographer in place and does not wish to make changes at this time.   Consent:  [facility] services reviewed and discussed.  Patient verbally agrees to Disease Management outreaches.  Continues to work with [facility]  for medication assistance.  Plan: RN Health Coach will send primary MD barriers letter. RN Health Coach will route initial telephone assessment note to primary MD. Reader will send patient [place]. RN Health Coach will send patient Living Better with Heart Failure Packet. RN Health Coach will send patient Living Well with Diabetes Packet. RN Health Coach will make next telephone outreach to patient in the month of December.   [place] [phone] [email or web address]

## 2018-08-24 ENCOUNTER — Telehealth: Payer: Self-pay | Admitting: Oncology

## 2018-08-24 ENCOUNTER — Other Ambulatory Visit: Payer: Self-pay | Admitting: Pharmacist

## 2018-08-24 NOTE — Patient Outreach (Signed)
[facility]) Care Management  08/24/2018  Arya Luttrull 27-Mar-1941 [id number]   Called patient to update her on the status of her application. HIPAA identifiers were obtained.  Patient's application has been mailed to [name] and Etter Sjogren is scheduled to follow up with [name] during the first week of October.    Plan: Patient will be followed by Etter Sjogren.  Elayne Guerin, PharmD, [place] Clinical Pharmacist [phone]

## 2018-08-24 NOTE — Telephone Encounter (Signed)
FS PAL - moved 10/10 appointments to 10/29. Spoke with patient.

## 2018-08-29 ENCOUNTER — Other Ambulatory Visit: Payer: Self-pay | Admitting: Pharmacy Technician

## 2018-08-29 NOTE — Patient Outreach (Signed)
[facility] [facility]) Care Management  08/30/2018  Sara Warner Nov 16, 1941 [id number]   Follow up call to [name] patient assistance. Spoke to Mickel Baas who stated patients application had been received and that Attestation letter was mailed to patient on 9/26.  Will follow up with patient to inform.  [name] [place], [place] Management [phone]

## 2018-08-30 NOTE — Patient Outreach (Signed)
[place] [facility]) Care Management  08/30/2018  Sara Warner 12-Jul-1941 [id number]   Successful outreach call to patient, HIPAA identifiers verified. Informed patient that [employer] out attestation letter on 9/26. She states that she has not received it yet. I requested that she call me when she receives it so that I can assist her with filling the form out.  Will follow up with patient in 2-3 business days if she has not called.  [name] [place], [place] Management [phone]

## 2018-09-02 ENCOUNTER — Other Ambulatory Visit: Payer: Self-pay | Admitting: Pharmacy Technician

## 2018-09-02 NOTE — Patient Outreach (Signed)
[facility]) Care Management  09/02/2018  Sara Warner November 24, 1941 [id number]   Successful outreach call to [name], HIPAA identifiers verified. [name] had left a message on my voicemail stating that she received attestation form from [name] patient assistance. Helped patient completed attestation form and informed her that application that was mailed to her would need to be mailed back to company as well.  Patient stated she would place in the mail today.  Will follow up with [name] in 7-10 business days to confirm form has been received.  [name] [place], [place] Management [phone]

## 2018-09-08 ENCOUNTER — Other Ambulatory Visit: Payer: PPO

## 2018-09-08 ENCOUNTER — Ambulatory Visit: Payer: PPO | Admitting: Oncology

## 2018-09-14 ENCOUNTER — Other Ambulatory Visit: Payer: Self-pay

## 2018-09-14 DIAGNOSIS — C9210 Chronic myeloid leukemia, BCR/ABL-positive, not having achieved remission: Secondary | ICD-10-CM

## 2018-09-14 MED ORDER — NILOTINIB HCL 150 MG PO CAPS: 300.0000 mg | ORAL_CAPSULE | Freq: Two times a day (BID) | ORAL | 0 refills | Status: DC

## 2018-09-22 ENCOUNTER — Encounter: Payer: Self-pay | Admitting: Pharmacist

## 2018-09-27 ENCOUNTER — Inpatient Hospital Stay: Payer: PPO | Admitting: Oncology

## 2018-09-27 ENCOUNTER — Inpatient Hospital Stay: Payer: PPO | Attending: Oncology

## 2018-09-29 ENCOUNTER — Telehealth: Payer: Self-pay | Admitting: Oncology

## 2018-09-29 NOTE — Telephone Encounter (Signed)
Patient called to reschedule appointment she missed

## 2018-10-03 NOTE — Progress Notes (Signed)
Patient ID: Sara Warner, female   DOB: Mar 04, 1941, 77 y.o.   MRN: [id number]  Patient presents to be measured for diabetic shoes and inserts with Betha CPed.  We will call when shoes and inserts arrive

## 2018-10-04 ENCOUNTER — Ambulatory Visit: Payer: Self-pay | Admitting: Pharmacist

## 2018-10-19 ENCOUNTER — Inpatient Hospital Stay (HOSPITAL_BASED_OUTPATIENT_CLINIC_OR_DEPARTMENT_OTHER): Payer: PPO | Admitting: Oncology

## 2018-10-19 ENCOUNTER — Telehealth: Payer: Self-pay | Admitting: Oncology

## 2018-10-19 ENCOUNTER — Encounter: Payer: Self-pay | Admitting: Oncology

## 2018-10-19 ENCOUNTER — Inpatient Hospital Stay: Payer: PPO | Attending: Oncology

## 2018-10-19 ENCOUNTER — Other Ambulatory Visit: Payer: Self-pay | Admitting: Pharmacy Technician

## 2018-10-19 VITALS — BP 160/73 | HR 85 | Temp 98.3°F | Resp 17 | Ht 68.0 in | Wt 198.2 lb

## 2018-10-19 DIAGNOSIS — E119 Type 2 diabetes mellitus without complications: Secondary | ICD-10-CM

## 2018-10-19 DIAGNOSIS — Z7984 Long term (current) use of oral hypoglycemic drugs: Secondary | ICD-10-CM

## 2018-10-19 DIAGNOSIS — Z79899 Other long term (current) drug therapy: Secondary | ICD-10-CM | POA: Diagnosis not present

## 2018-10-19 DIAGNOSIS — I429 Cardiomyopathy, unspecified: Secondary | ICD-10-CM | POA: Diagnosis not present

## 2018-10-19 DIAGNOSIS — C9210 Chronic myeloid leukemia, BCR/ABL-positive, not having achieved remission: Secondary | ICD-10-CM

## 2018-10-19 DIAGNOSIS — R0602 Shortness of breath: Secondary | ICD-10-CM | POA: Diagnosis not present

## 2018-10-19 LAB — CMP (CANCER CENTER ONLY)
ALT: 19 U/L (ref 0–44)
AST: 15 U/L (ref 15–41)
Albumin: 4.5 g/dL (ref 3.5–5.0)
Alkaline Phosphatase: 49 U/L (ref 38–126)
Anion gap: 10 (ref 5–15)
BUN: 29 mg/dL — ABNORMAL HIGH (ref 8–23)
CO2: 25 mmol/L (ref 22–32)
Calcium: 9.9 mg/dL (ref 8.9–10.3)
Chloride: 104 mmol/L (ref 98–111)
Creatinine: 1.84 mg/dL — ABNORMAL HIGH (ref 0.44–1.00)
GFR, Est AFR Am: 30 mL/min — ABNORMAL LOW
GFR, Estimated: 26 mL/min — ABNORMAL LOW
Glucose, Bld: 120 mg/dL — ABNORMAL HIGH (ref 70–99)
Potassium: 4.7 mmol/L (ref 3.5–5.1)
Sodium: 139 mmol/L (ref 135–145)
Total Bilirubin: 0.8 mg/dL (ref 0.3–1.2)
Total Protein: 7.6 g/dL (ref 6.5–8.1)

## 2018-10-19 LAB — CBC WITH DIFFERENTIAL (CANCER CENTER ONLY)
Abs Immature Granulocytes: 0.14 K/uL — ABNORMAL HIGH (ref 0.00–0.07)
Basophils Absolute: 0.2 K/uL — ABNORMAL HIGH (ref 0.0–0.1)
Basophils Relative: 2 %
Eosinophils Absolute: 0.4 K/uL (ref 0.0–0.5)
Eosinophils Relative: 3 %
HCT: 44.3 % (ref 36.0–46.0)
Hemoglobin: 13.6 g/dL (ref 12.0–15.0)
Immature Granulocytes: 1 %
Lymphocytes Relative: 27 %
Lymphs Abs: 2.9 K/uL (ref 0.7–4.0)
MCH: 27.2 pg (ref 26.0–34.0)
MCHC: 30.7 g/dL (ref 30.0–36.0)
MCV: 88.6 fL (ref 80.0–100.0)
Monocytes Absolute: 0.8 K/uL (ref 0.1–1.0)
Monocytes Relative: 8 %
Neutro Abs: 6.5 K/uL (ref 1.7–7.7)
Neutrophils Relative %: 59 %
Platelet Count: 400 K/uL (ref 150–400)
RBC: 5 MIL/uL (ref 3.87–5.11)
RDW: 15.1 % (ref 11.5–15.5)
WBC Count: 11 K/uL — ABNORMAL HIGH (ref 4.0–10.5)
nRBC: 0 % (ref 0.0–0.2)

## 2018-10-19 MED ORDER — NILOTINIB HCL 150 MG PO CAPS: 300.0000 mg | ORAL_CAPSULE | Freq: Two times a day (BID) | ORAL | 2 refills | Status: DC

## 2018-10-19 NOTE — Patient Outreach (Signed)
[facility] [facility]) Care Management  10/19/2018  Samone Guhl 1941-08-19 [id number]  Successful call to patient, HIPAA identifiers verified. Patient confirms she received her Januvia from [name] patient assistance. Patient also states that she is interested in reapplying for 2020.  Prepared patient and provider portion to be mailed to patient and N. Tobie Lords.  Will follow up with patient in 5-7 business days to confirm application has been received.  [name] Chana Bode [place] Certified Pharmacy Technician [place] Management Direct [phone]

## 2018-10-19 NOTE — Progress Notes (Signed)
Hematology and Oncology Follow Up Visit  Sara Warner [id number] 1941-05-26 77 y.o. 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  She presented with leukocytosis and a bone marrow biopsy confirmed the diagnosis.  Current treatment: Tasigna 300 mg bid started 11/2012 she achieved complete hematological and molecular remission.  Interim History: Sara Warner is here for a follow-up.  She reports no major changes in her health since the last visit.  He continues to take Tasigna 300 mg bid regularly with very few missed doses.  She denies any new complications related to this medication.  She denies any excessive fatigue, tiredness or fluid retention.  She does report periodically episode of shortness of breath and does require extra doses of diuretics but no hospitalizations or illnesses.  She continues to attend to activities of daily living without any decline.  Appetite and performance status is unchanged.  She is not report any headaches, blurry vision, syncope or seizures. She  does not report any fevers, does not report any chills. She does not report any chest pain She does not report any dyspnea on exertion or leg edema. She does not report any cough or hemoptysis. He does not report any nausea, vomiting, abdominal pain. She does not report any hematochezia or melena.  She denies any frequency, urgency or hesitancy.  She does not report any skin rashes or lesions.  She denies any lymphadenopathy or petechiae.  The remaining review of systems is negative.  Medications: I have reviewed the patient's current medications. Current Outpatient Medications  Medication Sig Dispense Refill  . clorazepate (TRANXENE-T) 7.5 MG tablet Take 1 tablet (7.5 mg total) by mouth 2 (two) times daily as needed for anxiety. 30 tablet 1  . digoxin (LANOXIN) 0.125 MG tablet Take 0.125 mg by mouth once a week. Takes on Monday only.    . furosemide  (LASIX) 20 MG tablet Take 2 tablets (30m) in the morning and an extra tablet (26m if gain 3 or more pounds in one day. (Patient taking differently: 20 mg 2 (two) times daily. Take 2 tablets (4061min the morning and an extra tablet (86m17mf gain 3 or more pounds in one day.) 270 tablet 2  . glipiZIDE (GLUCOTROL XL) 10 MG 24 hr tablet Take 10 mg by mouth 2 (two) times daily.    . metFORMIN (GLUCOPHAGE-XR) 500 MG 24 hr tablet Take 1 tablet by mouth 2 (two) times daily.    . metoprolol (TOPROL-XL) 200 MG 24 hr tablet TAKE 1 TABLET BY MOUTH EVERY DAY 90 tablet 1  . nilotinib (TASIGNA) 150 MG capsule Take 2 capsules (300 mg total) by mouth every 12 (twelve) hours. Take on an empty stomach, 1 hour before or 2 hours after meals. 120 capsule 2  . potassium chloride SA (K-DUR,KLOR-CON) 20 MEQ tablet Take 20 mEq by mouth daily.    . sitaGLIPtin (JANUVIA) 50 MG tablet Take 50 mg by mouth.     . spironolactone (ALDACTONE) 25 MG tablet Take 0.5 tablets (12.5 mg total) by mouth 2 (two) times daily. 90 tablet 3  . SYNTHROID 150 MCG tablet Take 150 mcg by mouth daily.  1   No current facility-administered medications for this visit.     Allergies:  Allergies  Allergen Reactions  . Losartan Other (See Comments)    Patient reports nightmares/vivid dreams when taking.  . Lisinopril Cough    Past Medical History, Surgical history, Social history, and Family History were reviewed  and updated.   Physical Exam: Blood pressure (!) 160/73, pulse 85, temperature 98.3 F (36.8 C), temperature source Oral, resp. rate 17, height 5' 8"  (1.727 m), weight 198 lb 3.2 oz (89.9 kg), SpO2 95 %.   ECOG: 1 General appearance: Alert, awake woman without distress. Head: Atraumatic without abnormalities. Oropharynx: Without any thrush or ulcers. Eyes: No scleral icterus. Lymph nodes: No lymphadenopathy palpated today in the cervical, axillary or supraclavicular regions. Heart: Irregular without any murmurs or  gallops. Lung: Clear to auscultation without any rhonchi, wheezes or dullness to percussion. Abdomen: Soft, nontender without any rebound or guarding. Musculoskeletal: No joint deformity or effusion. Skin: No ecchymosis or petechiae. Neurological: No motor or sensory deficits.  Lab Results: Lab Results  Component Value Date   WBC 11.0 (H) 10/19/2018   HGB 13.6 10/19/2018   HCT 44.3 10/19/2018   MCV 88.6 10/19/2018   PLT 400 10/19/2018     Chemistry      Component Value Date/Time   NA 139 10/19/2018 0916   NA 140 03/22/2018 0957   NA 140 09/08/2017 1417   K 4.7 10/19/2018 0916   K 4.0 09/08/2017 1417   CL 104 10/19/2018 0916   CL 106 04/26/2013 1252   CO2 25 10/19/2018 0916   CO2 24 09/08/2017 1417   BUN 29 (H) 10/19/2018 0916   BUN 28 (H) 03/22/2018 0957   BUN 26.9 (H) 09/08/2017 1417   CREATININE 1.84 (H) 10/19/2018 0916   CREATININE 1.4 (H) 09/08/2017 1417      Component Value Date/Time   CALCIUM 9.9 10/19/2018 0916   CALCIUM 9.5 09/08/2017 1417   ALKPHOS 49 10/19/2018 0916   ALKPHOS 44 09/08/2017 1417   AST 15 10/19/2018 0916   AST 19 09/08/2017 1417   ALT 19 10/19/2018 0916   ALT 23 09/08/2017 1417   BILITOT 0.8 10/19/2018 0916   BILITOT 0.65 09/08/2017 1417         Impression and Plan:   77 year old woman with:   1. Chronic phase CML diagnosed in 2013.  She remains on Tasigna since 11/2012 with complete hematological and molecular response.  The natural course of this disease was discussed today with the patient as well as treatment options.  Risks and benefits of continuing this medication were reviewed today she is agreeable to continue.  Molecular testing will be repeated at her next visit to ensure continued molecular remission.  A refill to segment was sent to her pharmacy today.  2. Diabetes mellitus: No recent complications of diabetes noted.  This medication can interfere with her glycemic control and will continue to address that issue with  her.  3. Cardiomyopathy: Appears to be compensated at this time. No exacerbation related to Tasigna.  Echocardiogram obtained in March 2019 showed improved EF compared to previous echoes.  4. Follow-up:  6 months.    Sara Warner 11/20/20199:43 AM

## 2018-10-19 NOTE — Telephone Encounter (Signed)
Printed calendar and avs. °

## 2018-10-20 ENCOUNTER — Encounter: Payer: Self-pay | Admitting: Sports Medicine

## 2018-10-20 ENCOUNTER — Ambulatory Visit (INDEPENDENT_AMBULATORY_CARE_PROVIDER_SITE_OTHER): Payer: PPO | Admitting: Sports Medicine

## 2018-10-20 VITALS — BP 138/74 | HR 92 | Resp 16

## 2018-10-20 DIAGNOSIS — M79675 Pain in left toe(s): Secondary | ICD-10-CM | POA: Diagnosis not present

## 2018-10-20 DIAGNOSIS — E1142 Type 2 diabetes mellitus with diabetic polyneuropathy: Secondary | ICD-10-CM | POA: Diagnosis not present

## 2018-10-20 DIAGNOSIS — M79674 Pain in right toe(s): Secondary | ICD-10-CM

## 2018-10-20 DIAGNOSIS — M79671 Pain in right foot: Secondary | ICD-10-CM

## 2018-10-20 DIAGNOSIS — L84 Corns and callosities: Secondary | ICD-10-CM

## 2018-10-20 DIAGNOSIS — S90415A Abrasion, left lesser toe(s), initial encounter: Secondary | ICD-10-CM

## 2018-10-20 DIAGNOSIS — M79672 Pain in left foot: Secondary | ICD-10-CM

## 2018-10-20 DIAGNOSIS — B351 Tinea unguium: Secondary | ICD-10-CM

## 2018-10-20 NOTE — Progress Notes (Signed)
Subjective: Sara Warner is a 77 y.o. female patient seen in office for evaluation of calluses bilateral and diabetic nail care.  Patient states that her blood sugar was 128 and she saw primary care doctor 4 months ago.  Patient reports that she did bump her baby toe but did not feel it and has noticed a scrape at the toe denies any swelling drainage pain or any other symptoms at this time.  Denies any other pedal complaints.  Patient Active Problem List   Diagnosis Date Noted  . NICM (nonischemic cardiomyopathy) ([place])   . Chronic atrial fibrillation   . Hypertensive heart disease   . Stage III chronic kidney disease ([place]) 10/02/2015  . Controlled type 2 diabetes mellitus without complication ([place]) 94/80/1655  . CML (chronic myelocytic leukemia) ([place]) 01/10/2013  . Atrial fibrillation ([place]) 01/05/2012  . Hypothyroid 01/05/2012  . Cardiomyopathy ([place]) 01/05/2012   Current Outpatient Medications on File Prior to Visit  Medication Sig Dispense Refill  . clorazepate (TRANXENE-T) 7.5 MG tablet Take 1 tablet (7.5 mg total) by mouth 2 (two) times daily as needed for anxiety. 30 tablet 1  . digoxin (LANOXIN) 0.125 MG tablet Take 0.125 mg by mouth once a week. Takes on Monday only.    . furosemide (LASIX) 20 MG tablet Take 2 tablets (83m) in the morning and an extra tablet (268m if gain 3 or more pounds in one day. (Patient taking differently: 20 mg 2 (two) times daily. Take 2 tablets (404min the morning and an extra tablet (81m68mf gain 3 or more pounds in one day.) 270 tablet 2  . glipiZIDE (GLUCOTROL XL) 10 MG 24 hr tablet Take 10 mg by mouth 2 (two) times daily.    . metFORMIN (GLUCOPHAGE-XR) 500 MG 24 hr tablet Take 1 tablet by mouth 2 (two) times daily.    . metoprolol (TOPROL-XL) 200 MG 24 hr tablet TAKE 1 TABLET BY MOUTH EVERY DAY 90 tablet 1  . nilotinib (TASIGNA) 150 MG capsule Take 2 capsules (300 mg total) by mouth every 12 (twelve) hours. Take on an empty stomach, 1 hour before or  2 hours after meals. 120 capsule 2  . potassium chloride SA (K-DUR,KLOR-CON) 20 MEQ tablet Take 20 mEq by mouth daily.    . sitaGLIPtin (JANUVIA) 50 MG tablet Take 50 mg by mouth.     . spironolactone (ALDACTONE) 25 MG tablet Take 0.5 tablets (12.5 mg total) by mouth 2 (two) times daily. 90 tablet 3  . SYNTHROID 150 MCG tablet Take 150 mcg by mouth daily.  1   No current facility-administered medications on file prior to visit.    Allergies  Allergen Reactions  . Losartan Other (See Comments)    Patient reports nightmares/vivid dreams when taking.  . Lisinopril Cough    Recent Results (from the past 2160 hour(s))  HM DIABETES FOOT EXAM     Status: None   Collection Time: 08/11/18 12:00 AM  Result Value Ref Range   HM Diabetic Foot Exam completed     Comment: Dr. StovCannon KettleC with Differential ([place])     Status: Abnormal   Collection Time: 10/19/18  9:16 AM  Result Value Ref Range   WBC Count 11.0 (H) 4.0 - 10.5 K/uL   RBC 5.00 3.87 - 5.11 MIL/uL   Hemoglobin 13.6 12.0 - 15.0 g/dL   HCT 44.3 36.0 - 46.0 %   MCV 88.6 80.0 - 100.0 fL   MCH 27.2 26.0 - 34.0 pg  MCHC 30.7 30.0 - 36.0 g/dL   RDW 15.1 11.5 - 15.5 %   Platelet Count 400 150 - 400 K/uL   nRBC 0.0 0.0 - 0.2 %   Neutrophils Relative % 59 %   Neutro Abs 6.5 1.7 - 7.7 K/uL   Lymphocytes Relative 27 %   Lymphs Abs 2.9 0.7 - 4.0 K/uL   Monocytes Relative 8 %   Monocytes Absolute 0.8 0.1 - 1.0 K/uL   Eosinophils Relative 3 %   Eosinophils Absolute 0.4 0.0 - 0.5 K/uL   Basophils Relative 2 %   Basophils Absolute 0.2 (H) 0.0 - 0.1 K/uL   Immature Granulocytes 1 %   Abs Immature Granulocytes 0.14 (H) 0.00 - 0.07 K/uL    Comment: Performed at [facility], [place] [address], [place], [place] [address]  CMP ([place] only)     Status: Abnormal   Collection Time: 10/19/18  9:16 AM  Result Value Ref Range   Sodium 139 135 - 145 mmol/L   Potassium 4.7 3.5 - 5.1 mmol/L   Chloride  104 98 - 111 mmol/L   CO2 25 22 - 32 mmol/L   Glucose, Bld 120 (H) 70 - 99 mg/dL   BUN 29 (H) 8 - 23 mg/dL   Creatinine 1.84 (H) 0.44 - 1.00 mg/dL   Calcium 9.9 8.9 - 10.3 mg/dL   Total Protein 7.6 6.5 - 8.1 g/dL   Albumin 4.5 3.5 - 5.0 g/dL   AST 15 15 - 41 U/L   ALT 19 0 - 44 U/L   Alkaline Phosphatase 49 38 - 126 U/L   Total Bilirubin 0.8 0.3 - 1.2 mg/dL   GFR, Est Non Af Am 26 (L) >60 mL/min   GFR, Est AFR Am 30 (L) >60 mL/min    Comment: (NOTE) The eGFR has been calculated using the CKD EPI equation. This calculation has not been validated in all clinical situations. eGFR's persistently <60 mL/min signify possible Chronic Kidney Disease.    Anion gap 10 5 - 15    Comment: Performed at [facility], [address], [place], [place] [address]    Objective: There were no vitals filed for this visit.  General: Patient is awake, alert, oriented x 3 and in no acute distress.  Integument: Abrasion to left fifth toe with no signs of infection.  Skin is warm, dry and supple bilateral. Nails are  mildly elongated thickened and dystrophic with subungual debris, consistent with onychomycosis, 1-5 bilateral. No signs of infection.+ preulcerative lesion present sub met 1 on left and right that is callused over with reactive tissue and no underlying opening. Remaining integument unremarkable.  Vasculature:  Dorsalis Pedis pulse 1/4 bilateral. Posterior Tibial pulse  1/4 bilateral.  Capillary fill time <3 sec 1-5 bilateral.  Scant hair growth to the level of the digits. Temperature gradient within normal limits.  Mild varicosities present bilateral.  Trace edema and mild hyperpigmentation present bilateral.   Neurology: The patient has diminished sensation measured with a 5.07/10g Semmes Weinstein Monofilament at all pedal sites bilateral. Vibratory sensation diminished bilateral with tuning fork. No Babinski sign present bilateral.   Musculoskeletal:  Asymptomatic bunion and hammertoe and pes planus pedal deformities noted bilateral. Muscular strength 4/5 in all lower extremity muscular groups bilateral without pain on range of motion .  No pain sub-met one bilateral.  No tenderness with calf compression bilateral.  Assessment and Plan:  Problem List Items Addressed This Visit  None    Visit Diagnoses    DM type 2 with diabetic peripheral neuropathy ([place])    -  Primary   Pain due to onychomycosis of toenails of both feet       Pre-ulcerative calluses       Foot pain, bilateral       Abrasion of fifth toe of left foot, initial encounter         -Examined patient -Mechanically debrided nails x10 using a sterile nail nipper and smooth with dermal without incident.  Complementary at no charge trimmed reactive callus sub-met one bilaterally using a sterile chisel blade without incident. -Applied antibiotic cream to abrasion at left fifth toe and advised patient to do the same daily for the next week until area has healed -Continue with diabetic shoes  -Patient to continue with daily foot inspection in the setting of diabetes as previous recommended -Patient to return in 9-10 weeks for diabetic nail and callus care.  Landis Martins, DPM

## 2018-10-24 ENCOUNTER — Telehealth: Payer: Self-pay

## 2018-10-24 NOTE — Telephone Encounter (Signed)
Oral Oncology Patient Advocate Encounter  [employer] notified me of Tasigna expiring 11/29/18. I filled out a new application and called the patient. Everything is still the same for her and she will come in tomorrow 83/16/74 to sign the application.  This encounter will be updated until final determination  [place] Patient [place] Phone [phone] Fax [phone]

## 2018-10-25 ENCOUNTER — Encounter: Payer: Self-pay | Admitting: Pharmacist

## 2018-10-25 ENCOUNTER — Ambulatory Visit: Payer: Self-pay | Admitting: Pharmacist

## 2018-10-25 NOTE — Telephone Encounter (Signed)
Oral Oncology Patient Advocate Encounter  I met Ms. Bellantoni in the lobby and she signed the application. I faxed the application to [employer] 10/25/18.  This encounter will be updated until final determination  [place] Patient [place] Phone [phone] Fax [phone]

## 2018-10-31 ENCOUNTER — Other Ambulatory Visit: Payer: Self-pay | Admitting: Pharmacy Technician

## 2018-10-31 NOTE — Patient Outreach (Signed)
[place] [facility]) Care Management  10/31/2018  Lecretia Buczek 02/04/1941 [id number]   Successful call to patient, HIPAA identifiers verified. Ms. Buendia confirms that she received the [name] patient assistance application and that she signed and placed it back in the mail.  Will submit completed application once all documents have been received.  [name] Chana Bode [place] Certified Pharmacy Technician [place] Management Direct Dial:[phone]

## 2018-11-04 ENCOUNTER — Encounter: Payer: Self-pay | Admitting: *Deleted

## 2018-11-04 ENCOUNTER — Other Ambulatory Visit: Payer: Self-pay | Admitting: *Deleted

## 2018-11-04 NOTE — Patient Outreach (Signed)
[facility]) Care Management  [facility] Care Manager  11/04/2018   Sara Warner 02/10/1941 [id number]   [place] Quarterly Outreach   Referral Date:  07/27/2018 Referral Source:  HTA Concierge Reason for Referral:  Medication Assistance and Disease Management Education Insurance:  [employer] Ad[employer]age   Outreach Attempt:  Successful telephone outreach to patient for quarterly follow up.  HIPAA verified with patient.  Patient stating she is doing well.  Reports she continues to monitor blood sugars a couple of times a week and monitors weights about weekly.  Discussed importance of daily weight monitoring with history of heart failure and on diuretics and also the importance of monitoring blood sugars at least daily.  Last fasting blood sugar monitored was 120 with fasting ranges of 90-200's, per patient.  Encouraged patient to monitor fasting sugars daily and to notify provider for sustained elevations.  Patient stating weight ranges from 189-192 pounds.  Again encouraged patient to weigh daily.  Instructed and encouraged patient to discuss frequency of blood sugars and weights with primary care provider.  Discussed eye examination and need to schedule appointment.  RN Health Coach offered to assist patient with arranging eye examination and patient stated she would call and arrange appointment herself.   Encounter Medications:  Outpatient Encounter Medications as of 11/04/2018  Medication Sig Note  . clorazepate (TRANXENE-T) 7.5 MG tablet Take 1 tablet (7.5 mg total) by mouth 2 (two) times daily as needed for anxiety.   . digoxin (LANOXIN) 0.125 MG tablet Take 0.125 mg by mouth once a week. Takes on Monday only.   . furosemide (LASIX) 20 MG tablet Take 2 tablets (40mg ) in the morning and an extra tablet (20mg ) if gain 3 or more pounds in one day. (Patient taking differently: 20 mg 2 (two) times daily. Take 2 tablets (40mg ) in the morning and an extra tablet (20mg ) if  gain 3 or more pounds in one day.)   . glipiZIDE (GLUCOTROL XL) 10 MG 24 hr tablet Take 10 mg by mouth 2 (two) times daily.   . metFORMIN (GLUCOPHAGE-XR) 500 MG 24 hr tablet Take 1 tablet by mouth 2 (two) times daily.   . metoprolol (TOPROL-XL) 200 MG 24 hr tablet TAKE 1 TABLET BY MOUTH EVERY DAY   . nilotinib (TASIGNA) 150 MG capsule Take 2 capsules (300 mg total) by mouth every 12 (twelve) hours. Take on an empty stomach, 1 hour before or 2 hours after meals.   . potassium chloride SA (K-DUR,KLOR-CON) 20 MEQ tablet Take 20 mEq by mouth daily.   . sitaGLIPtin (JANUVIA) 50 MG tablet Take 50 mg by mouth.    . spironolactone (ALDACTONE) 25 MG tablet Take 0.5 tablets (12.5 mg total) by mouth 2 (two) times daily. 07/27/2018: PRN-fluid/SOB  . SYNTHROID 150 MCG tablet Take 150 mcg by mouth daily.    No facility-administered encounter medications on file as of 11/04/2018.     Functional Status:  In your present state of health, do you have any difficulty performing the following activities: 08/22/2018  Hearing? N  Vision? N  Difficulty concentrating or making decisions? N  Walking or climbing stairs? Y  Comment trouble climbing stairs  Dressing or bathing? N  Doing errands, shopping? N  Preparing Food and eating ? N  Using the Toilet? N  In the past six months, have you accidently leaked urine? Y  Do you have problems with loss of bowel control? N  Managing your Medications? N  Managing your Finances? N  Housekeeping or managing your Housekeeping? Y  Comment daughter assist with heavy house cleaning  Some recent data might be hidden    Fall/Depression Screening: Fall Risk  11/04/2018 08/22/2018 07/26/2018  Falls in the past year? 0 No No  Risk for fall due to : Impaired vision;Medication side effect - -   PHQ 2/9 Scores 08/22/2018 07/26/2018  PHQ - 2 Score 0 0    THN CM Care Plan Problem One     Most Recent Value  Care Plan Problem One  Knowledge deficiet related to self care management  of diabetes and heart failure.  Role Documenting the Problem One  [place] for Problem One  Active  THN Long Term Goal   Patient will maintain Hgb A1C below 7 in the next 90 days.  THN Long Term Goal Start Date  11/04/18  Interventions for Problem One Long Term Goal  Reviewed and discussed current care plan and goals, reinforced with patient need to perform daily weight and daily CBG monitoring, encouraged patient to verify next medical appointment and attend appointment, reviewed medications and indications and encouraged medication compliance, encouraged patient to discuss with primary care provider frequency of weights and blood sugars at home  [facility] CM Short Term Goal #1   Patient will arrange appointment with eye doctor for yearly exam in the next 90 days.  THN CM Short Term Goal #1 Start Date  11/04/18  Interventions for Short Term Goal #1  Discussed importance of yearly eye examination, encouraged patient to schedule eye examination as soon as possible, offered assistance to schedule eye examination (patient declined stating she will schedule today)   THN CM Short Term Goal #2   Patient will report monitoring her blood sugars at least daily in the next 90 days.  THN CM Short Term Goal #2 Start Date  11/04/18  Interventions for Short Term Goal #2  Encouraged patient to find and review Diabetes educational booklet mailed to her, discussed importance of blood sugar monitoring and recording values for physician review, encouraged patient to contact primary care provider for sustained blood sugar elevations, encouraged diabetic low salt diet for patient, reviewed signs and symptoms of hypo and hyperglycemia  THN CM Short Term Goal #3  Patient will report monitoring daily weights in the next 90 days.  THN CM Short Term Goal #3 Start Date  11/04/18  Interventions for Short [place] Goal #3  Encouraged patient to look for Heart Failure Education mailed to her and to review educational material,  discussed importance of daily weight monitoring, discussed when to call the physician or take medications based on weight changes, reviewed low sodium diet with patient     Appointments:   Last appointment with primary care provider, Cyndi Bender PA ws 08/03/2018 and patient stating she thinks her next scheduled appointment is in January 2020; she is unsure of the exact date.  Encouraged patient to call office to confirm date and time of appointment.  Plan: RN Health Coach will send primary care provider quarterly update. RN Health Coach will make next telephone outreach to patient within the month of March.  [place] [phone] [email or web address]

## 2018-11-15 ENCOUNTER — Other Ambulatory Visit: Payer: Self-pay | Admitting: Pharmacy Technician

## 2018-11-15 NOTE — Patient Outreach (Signed)
[place] [facility]) Care Management  11/15/2018  Sara Warner 1940-12-21 [id number]   Received provider portion of [name] for [employer]. Prepared completed application to be mailed to [name].  Will follow up with [name] in 10-14 business days to check status.  [name] Chana Bode [place] Certified Pharmacy Technician [place] Management Direct Dial:[phone]

## 2018-11-28 NOTE — Telephone Encounter (Signed)
Oral Oncology Patient Advocate Encounter  I called [employer] to follow up on the [place] renewal application for [id number]. The application is now in insurance verification. [employer] informed me that they would still fill the Tasigna after 11/30/18 if the patient still has refills and the application is still in process. This prescription still has refills on it per [employer].  This encounter will be updated until final determination  [place] Patient [place] Phone [phone] Fax [phone]

## 2018-12-07 NOTE — Telephone Encounter (Signed)
Oral Oncology Patient Advocate Encounter  I followed up with [employer] today and spoke to Aaron. Aaron stated that the application is still in insurance verification and nothing new needs to be done at this time.  This encounter will be updated until final determination.  Briahna Pescador CPHT Specialty Pharmacy Patient Advocate [facility] Phone [phone] Fax [phone]   

## 2018-12-12 ENCOUNTER — Other Ambulatory Visit: Payer: Self-pay

## 2018-12-12 DIAGNOSIS — C9210 Chronic myeloid leukemia, BCR/ABL-positive, not having achieved remission: Secondary | ICD-10-CM

## 2018-12-12 MED ORDER — NILOTINIB HCL 150 MG PO CAPS: 300.0000 mg | ORAL_CAPSULE | Freq: Two times a day (BID) | ORAL | 2 refills | Status: DC

## 2018-12-15 ENCOUNTER — Other Ambulatory Visit: Payer: Self-pay | Admitting: Pharmacy Technician

## 2018-12-15 NOTE — Patient Outreach (Signed)
[place] [facility]) Care Management  12/15/2018  Takeria Marquina 10/19/1941 [id number]    Follow up call placed to [name] regarding patient assistance application(s) for Fleeta Emmer confirms patient application has been received and attestation form was mailed out to patient on 1/13.     Successful call placed to patient regarding patient assistance update for Januvia, HIPAA identifiers verified. Informed Ms. Vest of attestation form being mailed out and requested that she contact me when she receives it so that I can assist her with filling it out. She stated she would.  Will contact patient in 3-5 business days if I have not received a call from her.   [name] Chana Bode [place] Certified Pharmacy Technician [place] Management Direct [phone]

## 2018-12-21 ENCOUNTER — Other Ambulatory Visit: Payer: Self-pay | Admitting: Cardiovascular Disease

## 2018-12-27 ENCOUNTER — Other Ambulatory Visit: Payer: Self-pay | Admitting: Pharmacy Technician

## 2018-12-27 NOTE — Patient Outreach (Signed)
[facility] [facility]) Care Management  12/27/2018  Sara Warner 11/10/1941 [id number]   Incoming call from patient stating she had received the attestation form from [place]. Assisted her with fiolling form out.  Will follow up with [name] in 7-10 business days to confirm attestation form has been received.  [name] Chana Bode [place] Certified Pharmacy Technician [place] Management Direct Dial:[phone]

## 2018-12-29 ENCOUNTER — Encounter: Payer: Self-pay | Admitting: Sports Medicine

## 2018-12-29 ENCOUNTER — Ambulatory Visit: Payer: PPO | Admitting: Sports Medicine

## 2018-12-29 VITALS — BP 140/86 | HR 99 | Resp 16

## 2018-12-29 DIAGNOSIS — B351 Tinea unguium: Secondary | ICD-10-CM | POA: Diagnosis not present

## 2018-12-29 DIAGNOSIS — M79674 Pain in right toe(s): Secondary | ICD-10-CM | POA: Diagnosis not present

## 2018-12-29 DIAGNOSIS — E1142 Type 2 diabetes mellitus with diabetic polyneuropathy: Secondary | ICD-10-CM | POA: Diagnosis not present

## 2018-12-29 DIAGNOSIS — M79675 Pain in left toe(s): Secondary | ICD-10-CM

## 2018-12-29 DIAGNOSIS — M79671 Pain in right foot: Secondary | ICD-10-CM

## 2018-12-29 DIAGNOSIS — M79672 Pain in left foot: Secondary | ICD-10-CM

## 2018-12-29 DIAGNOSIS — L84 Corns and callosities: Secondary | ICD-10-CM

## 2018-12-29 NOTE — Progress Notes (Signed)
Subjective: Sara Warner is a 78 y.o. female patient seen in office for evaluation of calluses bilateral and diabetic nail care.  Patient states that her blood sugar was 176 on yesterday and she saw primary care doctor 6 months ago.  Patient reports that she wants new diabetic shoes for this year.  Patient denies any other pedal complaints.  Patient Active Problem List   Diagnosis Date Noted  . Bilateral impacted cerumen 06/23/2017  . 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 NICM (nonischemic cardiomyopathy) ([place])   . Chronic atrial fibrillation   . Hypertensive heart disease   . Stage III chronic kidney disease ([place]) 10/02/2015  . Controlled type 2 diabetes mellitus without complication ([place]) 01/65/5374  . 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 Atrial fibrillation ([place]) 01/05/2012  . Hypothyroid 01/05/2012  . Cardiomyopathy ([place]) 01/05/2012   Current Outpatient Medications on File Prior to Visit  Medication Sig Dispense Refill  . clorazepate (TRANXENE-T) 7.5 MG tablet Take 1 tablet (7.5 mg total) by mouth 2 (two) times daily as needed for anxiety. 30 tablet 1  . digoxin (LANOXIN) 0.125 MG tablet Take 0.125 mg by mouth once a week. Takes on Monday only.    . furosemide (LASIX) 20 MG tablet Take 2 tablets (4m) in the morning and an extra tablet (2109m if gain 3 or more pounds in one day. (Patient taking differently: 20 mg 2 (two) times daily. Take 2 tablets (4078min the morning and an extra tablet (55m51mf gain 3 or more pounds in one day.) 270 tablet 2  . glipiZIDE (GLUCOTROL XL) 10 MG 24 hr tablet Take 10 mg by mouth 2 (two) times daily.    . metFORMIN (GLUCOPHAGE-XR) 500 MG 24 hr tablet Take 1 tablet by mouth 2 (two) times daily.    . metoprolol (TOPROL-XL) 200 MG 24 hr tablet TAKE 1 TABLET BY MOUTH EVERY DAY 90 tablet 0  . nilotinib (TASIGNA) 150 MG capsule Take 2 capsules (300 mg total) by mouth every  12 (twelve) hours. Take on an empty stomach, 1 hour before or 2 hours after meals. 120 capsule 2  . potassium chloride SA (K-DUR,KLOR-CON) 20 MEQ tablet Take 20 mEq by mouth daily.    . sitaGLIPtin (JANUVIA) 50 MG tablet Take 50 mg by mouth.     . spironolactone (ALDACTONE) 25 MG tablet Take 0.5 tablets (12.5 mg total) by mouth 2 (two) times daily. 90 tablet 3  . SYNTHROID 150 MCG tablet Take 150 mcg by mouth daily.  1   No current facility-administered medications on file prior to visit.    Allergies  Allergen Reactions  . Losartan Other (See Comments)    Patient reports nightmares/vivid dreams when taking.  . Lisinopril Cough    Recent Results (from the past 2160 hour(s))  CBC with Differential (Cancer Center Only)     Status: Abnormal   Collection Time: 10/19/18  9:16 AM  Result Value Ref Range   WBC Count 11.0 (H) 4.0 - 10.5 K/uL   RBC 5.00 3.87 - 5.11 MIL/uL   Hemoglobin 13.6 12.0 - 15.0 g/dL   HCT 44.3 36.0 - 46.0 %   MCV 88.6 80.0 - 100.0 fL   MCH 27.2 26.0 - 34.0 pg   MCHC 30.7 30.0 - 36.0 g/dL   RDW 15.1 11.5 - 15.5 %   Platelet Count 400 150 - 400 K/uL   nRBC 0.0 0.0 - 0.2 %   Neutrophils  Relative % 59 %   Neutro Abs 6.5 1.7 - 7.7 K/uL   Lymphocytes Relative 27 %   Lymphs Abs 2.9 0.7 - 4.0 K/uL   Monocytes Relative 8 %   Monocytes Absolute 0.8 0.1 - 1.0 K/uL   Eosinophils Relative 3 %   Eosinophils Absolute 0.4 0.0 - 0.5 K/uL   Basophils Relative 2 %   Basophils Absolute 0.2 (H) 0.0 - 0.1 K/uL   Immature Granulocytes 1 %   Abs Immature Granulocytes 0.14 (H) 0.00 - 0.07 K/uL    Comment: Performed at [facility], [place] [address], [place], [place] [address]  CMP ([place] only)     Status: Abnormal   Collection Time: 10/19/18  9:16 AM  Result Value Ref Range   Sodium 139 135 - 145 mmol/L   Potassium 4.7 3.5 - 5.1 mmol/L   Chloride 104 98 - 111 mmol/L   CO2 25 22 - 32 mmol/L   Glucose, Bld 120 (H) 70 - 99 mg/dL   BUN 29 (H) 8 - 23  mg/dL   Creatinine 1.84 (H) 0.44 - 1.00 mg/dL   Calcium 9.9 8.9 - 10.3 mg/dL   Total Protein 7.6 6.5 - 8.1 g/dL   Albumin 4.5 3.5 - 5.0 g/dL   AST 15 15 - 41 U/L   ALT 19 0 - 44 U/L   Alkaline Phosphatase 49 38 - 126 U/L   Total Bilirubin 0.8 0.3 - 1.2 mg/dL   GFR, Est Non Af Am 26 (L) >60 mL/min   GFR, Est AFR Am 30 (L) >60 mL/min    Comment: (NOTE) The eGFR has been calculated using the CKD EPI equation. This calculation has not been validated in all clinical situations. eGFR's persistently <60 mL/min signify possible Chronic Kidney Disease.    Anion gap 10 5 - 15    Comment: Performed at [facility], [address] [place], [place] [address]    Objective: There were no vitals filed for this visit.  General: Patient is awake, alert, oriented x 3 and in no acute distress.  Integument:  Skin is warm, dry and supple bilateral there is a superficial patch of red skin consistent with a rash to the dorsal lateral left foot with no surrounding signs of infection. Nails are  mildly elongated thickened and dystrophic with subungual debris, consistent with onychomycosis, 1-5 bilateral. No signs of infection.+ preulcerative lesion present sub met 1 on left and right that is callused over with reactive tissue and no underlying opening. Remaining integument unremarkable.  Vasculature:  Dorsalis Pedis pulse 1/4 bilateral. Posterior Tibial pulse  1/4 bilateral.  Capillary fill time <3 sec 1-5 bilateral.  Scant hair growth to the level of the digits. Temperature gradient within normal limits.  Mild varicosities present bilateral.  Trace edema and mild hyperpigmentation present bilateral.   Neurology: The patient has diminished sensation measured with a 5.07/10g Semmes Weinstein Monofilament at all pedal sites bilateral. Vibratory sensation diminished bilateral with tuning fork. No Babinski sign present bilateral.   Musculoskeletal: Asymptomatic bunion and hammertoe  and pes planus pedal deformities noted bilateral. Muscular strength 4/5 in all lower extremity muscular groups bilateral without pain on range of motion .  No pain sub-met one bilateral.  No tenderness with calf compression bilateral.  Assessment and Plan:  Problem List Items Addressed This Visit    None    Visit Diagnoses    Pain due to onychomycosis of toenails of both feet    -  Primary   DM type 2 with diabetic peripheral neuropathy (HCC)       Pre-ulcerative calluses       Foot pain, bilateral         -Examined patient -Mechanically debrided nails x10 using a sterile nail nipper and smooth with dermal without incident.  Complementary at no charge trimmed reactive callus sub-met one bilaterally using a sterile chisel blade without incident. -Advised patient to use cortisone cream over-the-counter to the rash on the top of her left foot if fails to improve or if she notices that the area is itching to use antifungal foot spray at bedtime and to closely monitor if worsens return to office -Safe step diabetic shoe order form was completed; office to contact primary care for approval / certification;  Office to arrange shoe fitting and dispensing. -Patient to return in 9-10 weeks for diabetic nail and callus care and will call for diabetic shoe measurements.  Landis Martins, DPM

## 2019-01-02 DIAGNOSIS — H2513 Age-related nuclear cataract, bilateral: Secondary | ICD-10-CM | POA: Diagnosis not present

## 2019-01-02 DIAGNOSIS — E119 Type 2 diabetes mellitus without complications: Secondary | ICD-10-CM | POA: Diagnosis not present

## 2019-01-09 ENCOUNTER — Other Ambulatory Visit: Payer: Self-pay | Admitting: Pharmacy Technician

## 2019-01-09 NOTE — Patient Outreach (Signed)
[place] [facility]) Care Management  01/09/2019  Sara Warner 06/13/1941 [id number]    Follow up call placed to [name] regarding patient assistance application(s) for [name] confirms that attestation form has been received and patient was approved on 2/3.    Successful call placed to patient regarding patient assistance update for Januvia, HIPAA identifiers verified. [name] states that is running low on medication and has about 2 days left of medication.  Contacted [facility] pharmacy to check status of shipment. Sharyn Lull states that an order had not been requested. Requested an order be expedited and she states medication will be delivered on 2/13.  Informed patient, who states he will contact her provider to get more samples.  Will follow up with patient in 5-7 business days to confirm medication has been received.  [name] Chana Bode [place] Certified Pharmacy Technician [place] Management Direct Dial:[phone]

## 2019-01-10 DIAGNOSIS — E119 Type 2 diabetes mellitus without complications: Secondary | ICD-10-CM | POA: Diagnosis not present

## 2019-01-10 DIAGNOSIS — I10 Essential (primary) hypertension: Secondary | ICD-10-CM | POA: Diagnosis not present

## 2019-01-10 DIAGNOSIS — N183 Chronic kidney disease, stage 3 (moderate): Secondary | ICD-10-CM | POA: Diagnosis not present

## 2019-01-10 DIAGNOSIS — I4891 Unspecified atrial fibrillation: Secondary | ICD-10-CM | POA: Diagnosis not present

## 2019-01-10 DIAGNOSIS — Z86010 Personal history of colonic polyps: Secondary | ICD-10-CM | POA: Diagnosis not present

## 2019-01-10 DIAGNOSIS — Z79899 Other long term (current) drug therapy: Secondary | ICD-10-CM | POA: Diagnosis not present

## 2019-01-10 DIAGNOSIS — E890 Postprocedural hypothyroidism: Secondary | ICD-10-CM | POA: Diagnosis not present

## 2019-01-10 DIAGNOSIS — Z6831 Body mass index (BMI) 31.0-31.9, adult: Secondary | ICD-10-CM | POA: Diagnosis not present

## 2019-01-12 ENCOUNTER — Ambulatory Visit: Payer: PPO

## 2019-01-16 ENCOUNTER — Other Ambulatory Visit: Payer: Self-pay

## 2019-01-16 ENCOUNTER — Telehealth: Payer: Self-pay | Admitting: *Deleted

## 2019-01-16 DIAGNOSIS — C9210 Chronic myeloid leukemia, BCR/ABL-positive, not having achieved remission: Secondary | ICD-10-CM

## 2019-01-16 MED ORDER — NILOTINIB HCL 150 MG PO CAPS: 300.0000 mg | ORAL_CAPSULE | Freq: Two times a day (BID) | ORAL | 2 refills | Status: DC

## 2019-01-16 NOTE — Telephone Encounter (Signed)
"  Seleena Reimers [phone]).  I need my medication called in to [employer]"

## 2019-01-18 NOTE — Telephone Encounter (Signed)
Oral Oncology Patient Advocate Encounter  I followed up with [employer] today, the application is still in processing. The representative I spoke to said that they have until the beginning of March to complete re-enrollments.  This encounter will be updated until final determination.  [place] Patient [place] Phone [phone] Fax [phone]

## 2019-01-24 ENCOUNTER — Telehealth: Payer: Self-pay

## 2019-01-24 ENCOUNTER — Telehealth: Payer: Self-pay | Admitting: Pharmacist

## 2019-01-24 ENCOUNTER — Other Ambulatory Visit: Payer: Self-pay | Admitting: Pharmacy Technician

## 2019-01-24 NOTE — Patient Outreach (Signed)
[place] [facility]) Care Management  01/24/2019  Sara Warner 06-29-1941 [id number]   Pharmacy case is being closed as the patient was being followed for medication assistance and she received a 3 month supply of Januvia from [name] at no cost. Etter Sjogren, CPhT instructed the patient on how to reorder from [name]. She has been approved until the end of the year.   Elayne Guerin, PharmD, [place] Clinical Pharmacist [phone]

## 2019-01-24 NOTE — Patient Outreach (Signed)
[place] [facility]) Care Management  01/24/2019  Sara Warner [birth year] [id number]    Successful call placed to patient regarding patient assistance medication receipt from [place], HIPAA identifiers verified. [name] confirms that she received 3 months of Januvia. Reviewed with patient how to obtain refills. Patient states that she doesn't have any additional questions. Requested that she contact me if she runs into any issues getting medication.  Will route note to [place] for case closure.  [name] Chana Bode [place] Certified Pharmacy Technician [place] Management Direct Dial:[phone]

## 2019-01-24 NOTE — Telephone Encounter (Signed)
Oral Oncology Patient Advocate Encounter  Received notification from [facility] that prior authorization for Tasigna is required.  PA submitted on CoverMyMeds Key [id number] Status is pending  Oral Oncology Clinic will continue to follow.  [place] Patient [place] Phone [phone] Fax [phone] 01/24/2019    12:07 PM

## 2019-01-24 NOTE — Telephone Encounter (Signed)
Oral Oncology Patient Advocate Encounter  [employer] requested a Prior Auhorization be done for the patients Tasigna. I submitted that and it was approved. I faxed the Prior authorization approval letter to [employer] today.  This encounter will be updated until final determination.  [place] Patient [place] Phone [phone] Fax [phone] 01/24/2019   1:41 PM

## 2019-01-24 NOTE — Telephone Encounter (Signed)
-----   Message from Adaline Sill, CPhT sent at 01/24/2019 10:51 AM EST ----- Case closure, This is reenrollment for 2020, I didn't realize you were still on care team!

## 2019-01-24 NOTE — Telephone Encounter (Signed)
Oral Oncology Patient Advocate Encounter  Prior Authorization for Sara Warner has been approved.    PA# [id number] Effective dates: 01/24/19 through 01/24/20  Oral Oncology Clinic will continue to follow.   [name] Phone [phone] Fax [phone] 01/24/2019    1:28 PM

## 2019-02-01 NOTE — Telephone Encounter (Signed)
Oral Oncology Patient Advocate Encounter  I called [employer] to follow up on the renewal application, it is still in processing. The patient will still be able to call and get refills while the application is processing.  [place] Patient [place] Phone [phone] Fax [phone] 02/01/2019   9:26 AM

## 2019-02-06 ENCOUNTER — Other Ambulatory Visit: Payer: Self-pay | Admitting: *Deleted

## 2019-02-06 ENCOUNTER — Encounter: Payer: Self-pay | Admitting: *Deleted

## 2019-02-06 NOTE — Telephone Encounter (Signed)
Oral Oncology Patient Advocate Encounter  I followed up with [employer] today on the [place] and the application is in the final processing stage waiting on an outcome.  This encounter will be updated until final determination.  [place] Patient [place] Phone [phone] Fax [phone] 02/06/2019   1:29 PM

## 2019-02-06 NOTE — Patient Outreach (Signed)
[facility]) Care Management  [place]  02/06/2019   Sara Warner [birth year] [id number]   [place] Quarterly Outreach   Referral Date:  07/27/2018 Referral Source:  HTA Concierge Reason for Referral:  Medication Assistance and Disease Management Education Insurance:  [employer] Ad[employer]age   Outreach Attempt:  Successful telephone outreach to patient for Quarterly Update.  HIPAA verified with patient.  Patient reporting she is doing good.  Denies any sick days.  Latest Hgb A1C is 6.5 on 01/10/2019.  Had her yearly eye examination on 01/02/2019.  Does report she has not checked her blood sugar since January 09, 2019 and weighs herself occasionally.  States her weight stays within the range of 196-199 pounds.  Discussed importance of monitoring blood sugars and daily weights.  Patient also reporting she has been awaking out of her sleep with shortness of breath and has to take "anxiety pill and fluid pill".  States after taking those medications she feels better.  Encouraged patient to discuss episodes with her primary care provider.  Encounter Medications:  Outpatient Encounter Medications as of 02/06/2019  Medication Sig Note  . clorazepate (TRANXENE-T) 7.5 MG tablet Take 1 tablet (7.5 mg total) by mouth 2 (two) times daily as needed for anxiety.   . digoxin (LANOXIN) 0.125 MG tablet Take 0.125 mg by mouth once a week. Takes on Monday only.   . furosemide (LASIX) 20 MG tablet Take 2 tablets (60m) in the morning and an extra tablet (243m if gain 3 or more pounds in one day. (Patient taking differently: 20 mg 2 (two) times daily. Take 2 tablets (4019min the morning and an extra tablet (52m31mf gain 3 or more pounds in one day.)   . glipiZIDE (GLUCOTROL XL) 10 MG 24 hr tablet Take 10 mg by mouth 2 (two) times daily.   . metFORMIN (GLUCOPHAGE-XR) 500 MG 24 hr tablet Take 1 tablet by mouth 2 (two) times daily.   . metoprolol (TOPROL-XL) 200 MG 24 hr tablet TAKE  1 TABLET BY MOUTH EVERY DAY   . nilotinib (TASIGNA) 150 MG capsule Take 2 capsules (300 mg total) by mouth every 12 (twelve) hours. Take on an empty stomach, 1 hour before or 2 hours after meals.   . potassium chloride SA (K-DUR,KLOR-CON) 20 MEQ tablet Take 20 mEq by mouth daily.   . sitaGLIPtin (JANUVIA) 50 MG tablet Take 50 mg by mouth.    . SYNTHROID 150 MCG tablet Take 150 mcg by mouth daily.   . spMarland Kitchenronolactone (ALDACTONE) 25 MG tablet Take 0.5 tablets (12.5 mg total) by mouth 2 (two) times daily. 07/27/2018: PRN-fluid/SOB   No facility-administered encounter medications on file as of 02/06/2019.     Functional Status:  In your present state of health, do you have any difficulty performing the following activities: 08/22/2018  Hearing? N  Vision? N  Difficulty concentrating or making decisions? N  Walking or climbing stairs? Y  Comment trouble climbing stairs  Dressing or bathing? N  Doing errands, shopping? N  Preparing Food and eating ? N  Using the Toilet? N  In the past six months, have you accidently leaked urine? Y  Do you have problems with loss of bowel control? N  Managing your Medications? N  Managing your Finances? N  Housekeeping or managing your Housekeeping? Y  Comment daughter assist with heavy house cleaning  Some recent data might be hidden    Fall/Depression Screening: Fall Risk  02/06/2019 11/04/2018 08/22/2018  Falls  in the past year? 0 0 No  Risk for fall due to : Medication side effect;Impaired vision Impaired vision;Medication side effect -  Follow up Falls evaluation completed;Falls prevention discussed;Education provided - -   PHQ 2/9 Scores 08/22/2018 07/26/2018  PHQ - 2 Score 0 0   [facility] CM Care Plan Problem One     Most Recent Value  Care Plan Problem One  Knowledge deficiet related to self care management of diabetes and heart failure.  Role Documenting the Problem One  [place] for Problem One  Active  [facility] Long Term Goal   Patient will  maintain Hgb A1C below 7 in the next 90 days.  [facility] Long Term Goal Start Date  02/06/19  [facility] Long Term Goal Met Date  02/06/19  Interventions for Problem One Long Term Goal  Congratulated patient on current reduction in Hgb A1C, reviewed and encouraged continued diabetic diet options, encouraged patient to monitor her blood sugars, encouraged patient to monitor her weights, encouraged patient to notify her provider of the awakening with shortness of breath at nighttime, reviewed medications and encouraged medication compliance, encouraged patient to keep and attend medical appointments, cogratulated patient on attended her eye examination  [facility] CM Short Term Goal #1      [facility] CM Short Term Goal #1 Start Date  11/04/18  [facility] CM Short Term Goal #1 Met Date  02/06/19  [facility] CM Short Term Goal #2   Patient will report monitoring her blood sugars at least weekly in the next 90 days.  [facility] CM Short Term Goal #2 Start Date  02/06/19  Interventions for Short Term Goal #2  iscussed with patient importance of monitoring  her blood sugars, encouraged patient to set remider alarm on her phone to help her remember to check blood sugars daily, confirmed patient knows how to monitor blood sugars  [facility] CM Short Term Goal #3  Patient will report monitoring her weights at least weekly in the next 90 days.  [facility] CM Short Term Goal #3 Start Date  02/06/19  Interventions for Short [place] Goal #3  Discussed importance of weight monitoring and when to notify physician based on weight, reviewed signs and symtpoms of heart failure, encouraged patient to set reminder alarm to weigh herself, discussed importance of daily weight monitoring and need to do so to help physicians manage her care     Appointments:  Patient attended appointment with her primary care provider, Cyndi Bender PA on 01/10/2019 and has follow up in 6 months around August 2020.  Has scheduled appointment with Cardiologist, Dr. Claiborne Billings on 04/03/2019.  Plan: RN Health  Coach will send primary care provider quarterly update. RN Health Coach will make next telephone outreach to patient within the month of June.  [place] [phone] [email or web address]

## 2019-02-07 ENCOUNTER — Other Ambulatory Visit: Payer: Self-pay | Admitting: *Deleted

## 2019-02-07 DIAGNOSIS — C9210 Chronic myeloid leukemia, BCR/ABL-positive, not having achieved remission: Secondary | ICD-10-CM

## 2019-02-07 MED ORDER — NILOTINIB HCL 150 MG PO CAPS: 300.0000 mg | ORAL_CAPSULE | Freq: Two times a day (BID) | ORAL | 2 refills | Status: DC

## 2019-02-07 NOTE — Telephone Encounter (Signed)
Oral Oncology Patient Advocate Encounter  Sara Warner has been approved to be filled with the [employer] manufacturer assistance program through 11/30/19. The patient will continue to get the Tasigna shipped to her home free of charge from the contracted pharmacy, [facility] by [employer].   I called the patient and left her a detailed message.  [place] Patient [place] Phone [phone] Fax [phone] 02/07/2019   8:20 AM

## 2019-03-01 ENCOUNTER — Other Ambulatory Visit: Payer: Self-pay | Admitting: *Deleted

## 2019-03-01 NOTE — Patient Outreach (Signed)
[facility]) Care Management  03/01/2019  Sara Warner August 15, 1941 [id number]   RN Health Coach Case Closure/Transition to [facility]  Referral Date:07/27/2018 Referral Source:HTA Concierge Reason for Referral:Medication Assistance and Disease Management Education Insurance:Health [employer]vantage   Outreach Attempt:  Successful telephone outreach to patient for case closure and transition to [facility].  HIPAA verified with patient.  Patient verified she is enrolled with [facility].  RN Health Coach thanked patient for participation with [facility] and notified of need for Case closure due to enrolling in another Care Management program.  Plan:  RN Health Coach will close case at this time and make patient inactive with [facility].  RN Health Coach will send primary care provider Case Closure Letter.  RN Health Coach will send patient Case Closure Letter.  [place] [phone] [name]

## 2019-03-07 ENCOUNTER — Telehealth: Payer: Self-pay

## 2019-03-07 NOTE — Telephone Encounter (Signed)
Received message from patient requesting refill to be sent on Tasigna. Notified patient that she has 2 refills that can be called in. Patient confirmed that she has the contact number and will call in refill. No other questions or concerns.

## 2019-03-08 ENCOUNTER — Other Ambulatory Visit: Payer: Self-pay

## 2019-03-08 ENCOUNTER — Encounter: Payer: Self-pay | Admitting: Sports Medicine

## 2019-03-08 ENCOUNTER — Ambulatory Visit: Payer: PPO | Admitting: Sports Medicine

## 2019-03-08 VITALS — BP 135/91 | HR 88 | Temp 97.5°F | Resp 16

## 2019-03-08 DIAGNOSIS — M79674 Pain in right toe(s): Secondary | ICD-10-CM

## 2019-03-08 DIAGNOSIS — B351 Tinea unguium: Secondary | ICD-10-CM

## 2019-03-08 DIAGNOSIS — M79671 Pain in right foot: Secondary | ICD-10-CM

## 2019-03-08 DIAGNOSIS — E1142 Type 2 diabetes mellitus with diabetic polyneuropathy: Secondary | ICD-10-CM

## 2019-03-08 DIAGNOSIS — M79675 Pain in left toe(s): Secondary | ICD-10-CM | POA: Diagnosis not present

## 2019-03-08 DIAGNOSIS — M79672 Pain in left foot: Secondary | ICD-10-CM

## 2019-03-08 DIAGNOSIS — L84 Corns and callosities: Secondary | ICD-10-CM

## 2019-03-08 NOTE — Progress Notes (Signed)
Subjective: Sara Warner is a 78 y.o. female patient seen in office for evaluation of calluses bilateral and diabetic nail care.  Patient states that her blood sugar was 200 last week and she saw primary care doctor 9 months ago.  Patient denies any new changes in medications or any new pedal complaints at this time.  Patient Active Problem List   Diagnosis Date Noted  . Bilateral impacted cerumen 06/23/2017  . 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 NICM (nonischemic cardiomyopathy) ([place])   . Chronic atrial fibrillation   . Hypertensive heart disease   . Stage III chronic kidney disease ([place]) 10/02/2015  . Controlled type 2 diabetes mellitus without complication ([place]) 16/08/9603  . 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 Atrial fibrillation ([place]) 01/05/2012  . Hypothyroid 01/05/2012  . Cardiomyopathy ([place]) 01/05/2012   Current Outpatient Medications on File Prior to Visit  Medication Sig Dispense Refill  . clorazepate (TRANXENE-T) 7.5 MG tablet Take 1 tablet (7.5 mg total) by mouth 2 (two) times daily as needed for anxiety. 30 tablet 1  . digoxin (LANOXIN) 0.125 MG tablet Take 0.125 mg by mouth once a week. Takes on Monday only.    . furosemide (LASIX) 20 MG tablet Take 2 tablets (12m) in the morning and an extra tablet (229m if gain 3 or more pounds in one day. (Patient taking differently: 20 mg 2 (two) times daily. Take 2 tablets (4066min the morning and an extra tablet (66m31mf gain 3 or more pounds in one day.) 270 tablet 2  . glipiZIDE (GLUCOTROL XL) 10 MG 24 hr tablet Take 10 mg by mouth 2 (two) times daily.    . metFORMIN (GLUCOPHAGE-XR) 500 MG 24 hr tablet Take 1 tablet by mouth 2 (two) times daily.    . metoprolol (TOPROL-XL) 200 MG 24 hr tablet TAKE 1 TABLET BY MOUTH EVERY DAY 90 tablet 0  . nilotinib (TASIGNA) 150 MG capsule Take 2 capsules (300 mg total) by mouth every 12 (twelve) hours. Take  on an empty stomach, 1 hour before or 2 hours after meals. 120 capsule 2  . potassium chloride SA (K-DUR,KLOR-CON) 20 MEQ tablet Take 20 mEq by mouth daily.    . sitaGLIPtin (JANUVIA) 50 MG tablet Take 50 mg by mouth.     . SYNTHROID 150 MCG tablet Take 150 mcg by mouth daily.  1  . spironolactone (ALDACTONE) 25 MG tablet Take 0.5 tablets (12.5 mg total) by mouth 2 (two) times daily. 90 tablet 3   No current facility-administered medications on file prior to visit.    Allergies  Allergen Reactions  . Losartan Other (See Comments)    Patient reports nightmares/vivid dreams when taking.  . Lisinopril Cough    No results found for this or any previous visit (from the past 2160 hour(s)).  Objective: There were no vitals filed for this visit.  General: Patient is awake, alert, oriented x 3 and in no acute distress.  Integument:  Skin is warm, dry and supple bilateral. Nails are  mildly elongated thickened and dystrophic with subungual debris, consistent with onychomycosis, 1-5 bilateral. No signs of infection.+ preulcerative lesion present sub met 1 on left and right that is callused over with reactive tissue and no underlying opening. Remaining integument unremarkable.  Vasculature:  Dorsalis Pedis pulse 1/4 bilateral. Posterior Tibial pulse  1/4 bilateral.  Capillary fill time <3 sec 1-5 bilateral.  Scant hair growth to the level of  the digits. Temperature gradient within normal limits.  Mild varicosities present bilateral.  Trace edema and mild hyperpigmentation present bilateral.   Neurology: The patient has diminished sensation measured with a 5.07/10g Semmes Weinstein Monofilament at all pedal sites bilateral. Vibratory sensation diminished bilateral with tuning fork. No Babinski sign present bilateral.   Musculoskeletal: Asymptomatic bunion and hammertoe and pes planus pedal deformities noted bilateral. Muscular strength 4/5 in all lower extremity muscular groups bilateral without  pain on range of motion .  No pain sub-met one bilateral.  No tenderness with calf compression bilateral.  Assessment and Plan:  Problem List Items Addressed This Visit    None    Visit Diagnoses    Pain due to onychomycosis of toenails of both feet    -  Primary   Pre-ulcerative calluses       DM type 2 with diabetic peripheral neuropathy (HCC)       Foot pain, bilateral         -Examined patient -Mechanically debrided nails x10 using a sterile nail nipper and smooth with dermal without incident.  Complementary at no charge trimmed reactive callus sub-met one bilaterally using a sterile chisel blade without incident. -Continue with good supportive diabetic shoes with offloading sub-met 1 bilateral and advised patient to refrain from walking barefoot even in house -Patient to return in 10 to 12 weeks for diabetic nail and callus care and will call for diabetic shoe measurements.  Landis Martins, DPM

## 2019-03-28 DIAGNOSIS — N183 Chronic kidney disease, stage 3 (moderate): Secondary | ICD-10-CM | POA: Diagnosis not present

## 2019-03-28 DIAGNOSIS — N390 Urinary tract infection, site not specified: Secondary | ICD-10-CM | POA: Diagnosis not present

## 2019-03-28 DIAGNOSIS — Z6828 Body mass index (BMI) 28.0-28.9, adult: Secondary | ICD-10-CM | POA: Diagnosis not present

## 2019-03-28 DIAGNOSIS — I10 Essential (primary) hypertension: Secondary | ICD-10-CM | POA: Diagnosis not present

## 2019-03-28 DIAGNOSIS — E119 Type 2 diabetes mellitus without complications: Secondary | ICD-10-CM | POA: Diagnosis not present

## 2019-04-03 ENCOUNTER — Ambulatory Visit: Payer: PPO | Admitting: Cardiovascular Disease

## 2019-04-19 ENCOUNTER — Ambulatory Visit: Payer: PPO | Admitting: Oncology

## 2019-04-19 ENCOUNTER — Other Ambulatory Visit: Payer: PPO

## 2019-04-21 ENCOUNTER — Other Ambulatory Visit: Payer: Self-pay | Admitting: Cardiovascular Disease

## 2019-04-25 ENCOUNTER — Other Ambulatory Visit: Payer: Self-pay | Admitting: Oncology

## 2019-04-25 ENCOUNTER — Other Ambulatory Visit: Payer: Self-pay

## 2019-04-25 ENCOUNTER — Telehealth: Payer: Self-pay | Admitting: Oncology

## 2019-04-25 DIAGNOSIS — C9210 Chronic myeloid leukemia, BCR/ABL-positive, not having achieved remission: Secondary | ICD-10-CM

## 2019-04-25 MED ORDER — NILOTINIB HCL 150 MG PO CAPS: 300.0000 mg | ORAL_CAPSULE | Freq: Two times a day (BID) | ORAL | 2 refills | Status: DC

## 2019-04-25 NOTE — Telephone Encounter (Signed)
Spoke with patient re 5/29 appointments.

## 2019-04-28 ENCOUNTER — Inpatient Hospital Stay: Payer: PPO

## 2019-04-28 ENCOUNTER — Other Ambulatory Visit: Payer: Self-pay

## 2019-04-28 ENCOUNTER — Inpatient Hospital Stay (HOSPITAL_BASED_OUTPATIENT_CLINIC_OR_DEPARTMENT_OTHER): Payer: PPO | Admitting: Oncology

## 2019-04-28 ENCOUNTER — Inpatient Hospital Stay: Payer: PPO | Admitting: Oncology

## 2019-04-28 ENCOUNTER — Inpatient Hospital Stay: Payer: PPO | Attending: Oncology

## 2019-04-28 VITALS — BP 156/74 | HR 101 | Temp 98.3°F | Resp 18 | Wt 196.9 lb

## 2019-04-28 DIAGNOSIS — Z7984 Long term (current) use of oral hypoglycemic drugs: Secondary | ICD-10-CM | POA: Insufficient documentation

## 2019-04-28 DIAGNOSIS — Z79899 Other long term (current) drug therapy: Secondary | ICD-10-CM | POA: Diagnosis not present

## 2019-04-28 DIAGNOSIS — E119 Type 2 diabetes mellitus without complications: Secondary | ICD-10-CM | POA: Insufficient documentation

## 2019-04-28 DIAGNOSIS — C9210 Chronic myeloid leukemia, BCR/ABL-positive, not having achieved remission: Secondary | ICD-10-CM

## 2019-04-28 DIAGNOSIS — C9211 Chronic myeloid leukemia, BCR/ABL-positive, in remission: Secondary | ICD-10-CM

## 2019-04-28 LAB — CBC WITH DIFFERENTIAL (CANCER CENTER ONLY)
Abs Immature Granulocytes: 0.25 10*3/uL — ABNORMAL HIGH (ref 0.00–0.07)
Basophils Absolute: 0.2 10*3/uL — ABNORMAL HIGH (ref 0.0–0.1)
Basophils Relative: 1 %
Eosinophils Absolute: 0.4 10*3/uL (ref 0.0–0.5)
Eosinophils Relative: 3 %
HCT: 41.6 % (ref 36.0–46.0)
Hemoglobin: 12.9 g/dL (ref 12.0–15.0)
Immature Granulocytes: 2 %
Lymphocytes Relative: 26 %
Lymphs Abs: 3 10*3/uL (ref 0.7–4.0)
MCH: 27.7 pg (ref 26.0–34.0)
MCHC: 31 g/dL (ref 30.0–36.0)
MCV: 89.3 fL (ref 80.0–100.0)
Monocytes Absolute: 0.7 10*3/uL (ref 0.1–1.0)
Monocytes Relative: 6 %
Neutro Abs: 7.1 10*3/uL (ref 1.7–7.7)
Neutrophils Relative %: 62 %
Platelet Count: 379 10*3/uL (ref 150–400)
RBC: 4.66 MIL/uL (ref 3.87–5.11)
RDW: 15.7 % — ABNORMAL HIGH (ref 11.5–15.5)
WBC Count: 11.6 10*3/uL — ABNORMAL HIGH (ref 4.0–10.5)
nRBC: 0 % (ref 0.0–0.2)

## 2019-04-28 LAB — CMP (CANCER CENTER ONLY)
ALT: 18 U/L (ref 0–44)
AST: 14 U/L — ABNORMAL LOW (ref 15–41)
Albumin: 4.4 g/dL (ref 3.5–5.0)
Alkaline Phosphatase: 48 U/L (ref 38–126)
Anion gap: 10 (ref 5–15)
BUN: 37 mg/dL — ABNORMAL HIGH (ref 8–23)
CO2: 24 mmol/L (ref 22–32)
Calcium: 9.2 mg/dL (ref 8.9–10.3)
Chloride: 103 mmol/L (ref 98–111)
Creatinine: 2.02 mg/dL — ABNORMAL HIGH (ref 0.44–1.00)
GFR, Est AFR Am: 27 mL/min — ABNORMAL LOW (ref >60)
GFR, Estimated: 23 mL/min — ABNORMAL LOW (ref >60)
Glucose, Bld: 243 mg/dL — ABNORMAL HIGH (ref 70–99)
Potassium: 4.5 mmol/L (ref 3.5–5.1)
Sodium: 137 mmol/L (ref 135–145)
Total Bilirubin: 1.2 mg/dL (ref 0.3–1.2)
Total Protein: 7.3 g/dL (ref 6.5–8.1)

## 2019-04-28 NOTE — Progress Notes (Signed)
Hematology and Oncology Follow Up Visit  Sara Warner [id number] Jun 05, 1941 78 y.o. 04/28/2019 1:20 PM   Principle Diagnosis: 78 year old woman with CML diagnosed in 2013.  She was found to have chronic phase after presenting with leukocytosis and a bone marrow biopsy confirmed the diagnosis.  Current treatment: Tasigna 300 mg bid started 11/2012 she achieved complete hematological and molecular remission.  Interim History: Sara Warner returns today for a repeat evaluation.  Since the last visit, she reports no major changes in her health.  She denies any recent hospitalizations or illnesses.  She denied any complications related to [place].  She takes it regularly with very few missed doses.  He denies any respiratory complaints or shortness of breath.  She denies any dyspnea on exertion.  Her blood sugar has been monitored regularly and mildly elevated.  She denies skin rash or fluid retention.  Patient denied any alteration mental status, neuropathy, confusion or dizziness.  Denies any headaches or lethargy.  Denies any night sweats, weight loss or changes in appetite.  Denied orthopnea, dyspnea on exertion or chest discomfort.  Denies shortness of breath, difficulty breathing hemoptysis or cough.  Denies any abdominal distention, nausea, early satiety or dyspepsia.  Denies any hematuria, frequency, dysuria or nocturia.  Denies any skin irritation, dryness or rash.  Denies any ecchymosis or petechiae.  Denies any lymphadenopathy or clotting.  Denies any heat or cold intolerance.  Denies any anxiety or depression.  Remaining review of system is negative.      Medications: I have reviewed the patient's current medications. Current Outpatient Medications  Medication Sig Dispense Refill  . clorazepate (TRANXENE-T) 7.5 MG tablet Take 1 tablet (7.5 mg total) by mouth 2 (two) times daily as needed for anxiety. 30 tablet 1  . digoxin (LANOXIN) 0.125 MG tablet Take 0.125 mg by mouth once a week.  Takes on Monday only.    . furosemide (LASIX) 20 MG tablet Take 2 tablets (63m) in the morning and an extra tablet (247m if gain 3 or more pounds in one day. (Patient taking differently: 20 mg 2 (two) times daily. Take 2 tablets (4068min the morning and an extra tablet (54m77mf gain 3 or more pounds in one day.) 270 tablet 2  . glipiZIDE (GLUCOTROL XL) 10 MG 24 hr tablet Take 10 mg by mouth 2 (two) times daily.    . metFORMIN (GLUCOPHAGE-XR) 500 MG 24 hr tablet Take 1 tablet by mouth 2 (two) times daily.    . metoprolol (TOPROL-XL) 200 MG 24 hr tablet Take 1 tablet (200 mg total) by mouth daily. OV NEEDED 90 tablet 0  . nilotinib (TASIGNA) 150 MG capsule Take 2 capsules (300 mg total) by mouth every 12 (twelve) hours. Take on an empty stomach, 1 hour before or 2 hours after meals. 120 capsule 2  . potassium chloride SA (K-DUR,KLOR-CON) 20 MEQ tablet Take 20 mEq by mouth daily.    . sitaGLIPtin (JANUVIA) 50 MG tablet Take 50 mg by mouth.     . spironolactone (ALDACTONE) 25 MG tablet Take 0.5 tablets (12.5 mg total) by mouth 2 (two) times daily. 90 tablet 3  . SYNTHROID 150 MCG tablet Take 150 mcg by mouth daily.  1   No current facility-administered medications for this visit.     Allergies:  Allergies  Allergen Reactions  . Losartan Other (See Comments)    Patient reports nightmares/vivid dreams when taking.  . Lisinopril Cough    Past Medical History, Surgical history, Social history, and  Family History were reviewed and updated.   Physical Exam:  Blood pressure (!) 156/74, pulse (!) 101, temperature 98.3 F (36.8 C), temperature source Oral, resp. rate 18, weight 196 lb 14.4 oz (89.3 kg), SpO2 96 %.   ECOG: 1   General appearance: Comfortable appearing without any discomfort Head: Normocephalic without any trauma Oropharynx: Mucous membranes are moist and pink without any thrush or ulcers. Eyes: Pupils are equal and round reactive to light. Lymph nodes: No cervical,  supraclavicular, inguinal or axillary lymphadenopathy.   Heart:regular rate and rhythm.  S1 and S2 without leg edema. Lung: Clear without any rhonchi or wheezes.  No dullness to percussion. Abdomin: Soft, nontender, nondistended with good bowel sounds.  No hepatosplenomegaly. Musculoskeletal: No joint deformity or effusion.  Full range of motion noted. Neurological: No deficits noted on motor, sensory and deep tendon reflex exam. Skin: No petechial rash or dryness.  Appeared moist.    Lab Results: Lab Results  Component Value Date   WBC 11.0 (H) 10/19/2018   HGB 13.6 10/19/2018   HCT 44.3 10/19/2018   MCV 88.6 10/19/2018   PLT 400 10/19/2018     Chemistry      Component Value Date/Time   NA 139 10/19/2018 0916   NA 140 03/22/2018 0957   NA 140 09/08/2017 1417   K 4.7 10/19/2018 0916   K 4.0 09/08/2017 1417   CL 104 10/19/2018 0916   CL 106 04/26/2013 1252   CO2 25 10/19/2018 0916   CO2 24 09/08/2017 1417   BUN 29 (H) 10/19/2018 0916   BUN 28 (H) 03/22/2018 0957   BUN 26.9 (H) 09/08/2017 1417   CREATININE 1.84 (H) 10/19/2018 0916   CREATININE 1.4 (H) 09/08/2017 1417      Component Value Date/Time   CALCIUM 9.9 10/19/2018 0916   CALCIUM 9.5 09/08/2017 1417   ALKPHOS 49 10/19/2018 0916   ALKPHOS 44 09/08/2017 1417   AST 15 10/19/2018 0916   AST 19 09/08/2017 1417   ALT 19 10/19/2018 0916   ALT 23 09/08/2017 1417   BILITOT 0.8 10/19/2018 0916   BILITOT 0.65 09/08/2017 1417         Impression and Plan:   78 year old woman with:   1.  CML diagnosed in 2013.  She was found to have chronic phase with Tasigna since 11/2012.  She has achieved complete hematological and molecular remission.  The natural course of her disease as well as complications related to her current therapy were reviewed.  Risks and benefits of continuing this medication was also discussed.  These issues include diabetic exacerbation, heart failure among others.  Her laboratory data were  personally reviewed today and discussed with her.  Her white cell count remains close to normal range without any other cytopenias.  I recommended continuing same dose and schedule for the time being.  2. Diabetes mellitus: Her blood sugar runs slightly higher but for the most part under control.  She understands to modify her diet appropriately.  3. Cardiomyopathy: Echocardiogram in March 2019 showed stable ejection fraction.  4. Follow-up:  6 months for a repeat evaluation.  25  minutes was spent with the patient face-to-face today.  More than 50% of time was spent on reviewing her disease status, laboratory data answering questions regarding future plan of care.   Sara Warner 5/29/20201:20 PM

## 2019-05-04 ENCOUNTER — Ambulatory Visit: Payer: PPO | Admitting: *Deleted

## 2019-05-10 LAB — BCR/ABL

## 2019-05-17 ENCOUNTER — Ambulatory Visit: Payer: PPO | Admitting: Sports Medicine

## 2019-05-23 DIAGNOSIS — E890 Postprocedural hypothyroidism: Secondary | ICD-10-CM | POA: Diagnosis not present

## 2019-05-23 DIAGNOSIS — Z79899 Other long term (current) drug therapy: Secondary | ICD-10-CM | POA: Diagnosis not present

## 2019-05-23 DIAGNOSIS — L03116 Cellulitis of left lower limb: Secondary | ICD-10-CM | POA: Diagnosis not present

## 2019-05-23 DIAGNOSIS — E119 Type 2 diabetes mellitus without complications: Secondary | ICD-10-CM | POA: Diagnosis not present

## 2019-05-23 DIAGNOSIS — Z6831 Body mass index (BMI) 31.0-31.9, adult: Secondary | ICD-10-CM | POA: Diagnosis not present

## 2019-05-23 DIAGNOSIS — M79672 Pain in left foot: Secondary | ICD-10-CM | POA: Diagnosis not present

## 2019-06-28 ENCOUNTER — Other Ambulatory Visit: Payer: Self-pay | Admitting: Cardiovascular Disease

## 2019-07-03 ENCOUNTER — Other Ambulatory Visit: Payer: Self-pay | Admitting: Cardiovascular Disease

## 2019-07-07 ENCOUNTER — Other Ambulatory Visit: Payer: Self-pay | Admitting: *Deleted

## 2019-07-07 DIAGNOSIS — C9210 Chronic myeloid leukemia, BCR/ABL-positive, not having achieved remission: Secondary | ICD-10-CM

## 2019-07-07 MED ORDER — NILOTINIB HCL 150 MG PO CAPS: 300.0000 mg | ORAL_CAPSULE | Freq: Two times a day (BID) | ORAL | 2 refills | Status: DC

## 2019-07-07 NOTE — Progress Notes (Signed)
Faxed refill Tasigna to [facility] [employer]

## 2019-07-11 DIAGNOSIS — E119 Type 2 diabetes mellitus without complications: Secondary | ICD-10-CM | POA: Diagnosis not present

## 2019-07-11 DIAGNOSIS — Z6829 Body mass index (BMI) 29.0-29.9, adult: Secondary | ICD-10-CM | POA: Diagnosis not present

## 2019-07-11 DIAGNOSIS — I10 Essential (primary) hypertension: Secondary | ICD-10-CM | POA: Diagnosis not present

## 2019-07-11 DIAGNOSIS — C9110 Chronic lymphocytic leukemia of B-cell type not having achieved remission: Secondary | ICD-10-CM | POA: Diagnosis not present

## 2019-07-11 DIAGNOSIS — N183 Chronic kidney disease, stage 3 (moderate): Secondary | ICD-10-CM | POA: Diagnosis not present

## 2019-07-12 ENCOUNTER — Telehealth: Payer: Self-pay

## 2019-07-12 NOTE — Telephone Encounter (Signed)
Received call from patient requesting refill on Tasigna. Refill escribed on 8/7 by Dr. Alen Blew. Contacted pharmacy and clarified that script had been received. CS rep stated that they will contact patient today to set up delivery.

## 2019-08-15 DIAGNOSIS — Z6829 Body mass index (BMI) 29.0-29.9, adult: Secondary | ICD-10-CM | POA: Diagnosis not present

## 2019-08-15 DIAGNOSIS — I10 Essential (primary) hypertension: Secondary | ICD-10-CM | POA: Diagnosis not present

## 2019-08-15 DIAGNOSIS — Z23 Encounter for immunization: Secondary | ICD-10-CM | POA: Diagnosis not present

## 2019-08-15 DIAGNOSIS — I4891 Unspecified atrial fibrillation: Secondary | ICD-10-CM | POA: Diagnosis not present

## 2019-08-15 DIAGNOSIS — E119 Type 2 diabetes mellitus without complications: Secondary | ICD-10-CM | POA: Diagnosis not present

## 2019-08-15 DIAGNOSIS — Z79899 Other long term (current) drug therapy: Secondary | ICD-10-CM | POA: Diagnosis not present

## 2019-08-15 DIAGNOSIS — N184 Chronic kidney disease, stage 4 (severe): Secondary | ICD-10-CM | POA: Diagnosis not present

## 2019-08-15 DIAGNOSIS — R3911 Hesitancy of micturition: Secondary | ICD-10-CM | POA: Diagnosis not present

## 2019-08-16 ENCOUNTER — Ambulatory Visit (INDEPENDENT_AMBULATORY_CARE_PROVIDER_SITE_OTHER): Payer: PPO | Admitting: Cardiovascular Disease

## 2019-08-16 ENCOUNTER — Other Ambulatory Visit: Payer: Self-pay

## 2019-08-16 ENCOUNTER — Encounter: Payer: Self-pay | Admitting: Cardiovascular Disease

## 2019-08-16 VITALS — BP 141/63 | HR 98 | Temp 97.1°F | Ht 68.0 in | Wt 188.4 lb

## 2019-08-16 DIAGNOSIS — E119 Type 2 diabetes mellitus without complications: Secondary | ICD-10-CM | POA: Diagnosis not present

## 2019-08-16 DIAGNOSIS — C9210 Chronic myeloid leukemia, BCR/ABL-positive, not having achieved remission: Secondary | ICD-10-CM | POA: Diagnosis not present

## 2019-08-16 DIAGNOSIS — I482 Chronic atrial fibrillation, unspecified: Secondary | ICD-10-CM

## 2019-08-16 DIAGNOSIS — E039 Hypothyroidism, unspecified: Secondary | ICD-10-CM | POA: Diagnosis not present

## 2019-08-16 DIAGNOSIS — I480 Paroxysmal atrial fibrillation: Secondary | ICD-10-CM | POA: Diagnosis not present

## 2019-08-16 DIAGNOSIS — I428 Other cardiomyopathies: Secondary | ICD-10-CM | POA: Diagnosis not present

## 2019-08-16 NOTE — Progress Notes (Signed)
Patient ID: Sara Warner, female   DOB: January 30, 1941, 78 y.o.   MRN: [id number]     HPI: [name] is a 78 year old female who presents to the office today for a 17 month followup cardiology evaluation.   Sara Warner is a former patient of Dr. Doreatha Lew. She has a history of permanent atrial fibrillation and admits to being in atrial fibrillation for >18 years. She had problems with recurrent epistaxis in the past on Coumadin and consequently has not been on anticoagulation therapy. An echo Doppler study in 2008 showed an ejection fraction of 30-35%. A Myoview in February 2009 showed no ischemia but mild global reduction in LV function with an ejection fraction of 47%. An echo in 2010  showed low normal LV function with mild LA enlargement and mild mitral regurgitation and tricuspid regurgitation.  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 She does also have issues with thyroid abnormality. She has varicose veins and notes right lower extremity swelling.   When  I initially saw her in 2014, we discussed potential thromboembolic risk associated with atrial fibrillation. She has not been on any coagulation due to frequent nosebleeds requiring cauterization and does have CML. She did have a significant remote tobacco history of 2 packs per day for over 20 years. She had nonspecific ST changes inferolaterally on her EKG. A nuclear perfusion scan  on 07/05/2013 was not gated to her atrial fibrillation and was a low risk study demonstrating mild artifact. There was no scar or ischemia. A 2-D echo Doppler study showed normal systolic function and Doppler parameters suggested increased mean left atrial pressure. She is systolic bowing of her mitral valve leaflet without definitive prolapse with mild MR. Her left atrium was moderate to severely dilated and her right atrium was moderately dilated. There was mild pulmonary hypertension with PA pressure estimated at 34 mm.   A  follow-up echo Doppler study on  05/30/2015 revealed an ejection fraction of 35-40%.  There was diffuse hypokinesis with suggestion of akinesis involving the basal to mid inferoseptal wall.  There was aortic valve sclerosis without stenosis, and mild-to-moderate mitral and tricuspid regurgitation.  Her left atrium was moderately dilated.  There was mild pulmonary hypertension with estimated PA pressure 37 mm.  When I  saw her in July 2018 she was experiencing weakness and shortness of breath.  She was often sleeping in a recliner and was waking up at least  3-4 times per night for nocturia.  She denies associated chest pressure.  She has a history of kidney stones.  She has a history of renal insufficiency.  I saw her, I added spironolactone 12.5 mg.  I also suggested a sleep study for high concern for sleep apnea.  Her shortness of breath did slightly improve with spironolactone.  She continues to be on furosemide 40 mg daily She never followed up with a sleep study.    I saw her in 2018 which time she had  experienced some shortness of breath in the morning.  She still sleeps in a recliner.  Her father passed away in 11-06-23.  She has been on rate control for atrial fibrillation and most recently is on digoxin 0.125 mg daily and Toprol-XL 200 mg daily. Remotely, she had developed significant lower extremity edema on verapamil. She is diabetic on Glucotrol XL and Januvia.  She has a history of hypothyroidism on Synthroid 150 g.  GERD on Prilosec.  There also is a remote history of microcytic indices.  She is on  the Tasigna for her CML.  I suspected sleep apnea as a potential contributor to her poor sleep.  However she did not want to pursue a sleep study.  Since her last echo was in June 2016 which showed an EF of 35 to 40%, I recommended a follow-up echo Doppler study which was done onon February 04, 2018 and showed improvement in LV function with an EF now at 45 to 50%.  There was hypokinesis of the septum.  There was mild MR, her left  atrium was moderately dilated as was her right atrium.  PA pressure was mildly increased.    Since I last saw her in April 2019, she has remained fairly stable.  She specifically denies any chest pain or shortness of breath.  She believes she is sleeping well.  She tells me she had blood work done yesterday by Cyndi Bender who is her primary provider.  She presents for evaluation.  Past Medical History:  Diagnosis Date  . Atrial fibrillation ([place])   . Chronic atrial fibrillation    a. Refuses [place].  CHA2DS2VASc = 4.  . Chronic systolic CHF (congestive heart failure) ([place])    a. 05/2015 Echo: EF 35-40%.  . CKD (chronic kidney disease), stage III ([place])   . CML (chronic myelocytic leukemia) ([place]) 01/10/2013  . DM (diabetes mellitus) ([place])   . Elevated WBC count   . Hypertensive heart disease   . Hypothyroidism   . NICM (nonischemic cardiomyopathy) ([place])    a. 06/2013 MV: low risk study w/o ischemia;  b. 05/2015 Echo: EF 35-40%, diff HK, basal-midinferoseptal and basal-midanteroseptal AK. Mild-mod MR, mod dil LA, mild to mod TR, PASP 56mHg.  .Marland KitchenToxic goiter     Past Surgical History:  Procedure Laterality Date  . CARDIOVERSION    . CYSTECTOMY    . HYSTEROTOMY      Allergies  Allergen Reactions  . Losartan Other (See Comments)    Patient reports nightmares/vivid dreams when taking.  . Lisinopril Cough    Current Outpatient Medications  Medication Sig Dispense Refill  . clorazepate (TRANXENE-T) 7.5 MG tablet Take 1 tablet (7.5 mg total) by mouth 2 (two) times daily as needed for anxiety. 30 tablet 1  . digoxin (LANOXIN) 0.125 MG tablet Take 0.0625 mg by mouth every other day. Takes on Monday only.    . furosemide (LASIX) 20 MG tablet TAKE 2 TABLETS IN THE MORNING AND AN EXTRA TABLET IF GAIN 3 OR MORE POUNDS IN ONE DAY SCHEDULE OV. 180 tablet 0  . glipiZIDE (GLUCOTROL XL) 10 MG 24 hr tablet Take 10 mg by mouth 2 (two) times daily.    . metoprolol (TOPROL-XL) 200 MG 24 hr tablet TAKE 1  TABLET BY MOUTH DAILY. 90 tablet 0  . nilotinib (TASIGNA) 150 MG capsule Take 2 capsules (300 mg total) by mouth every 12 (twelve) hours. Take on an empty stomach, 1 hour before or 2 hours after meals. 120 capsule 2  . sitaGLIPtin (JANUVIA) 50 MG tablet Take 50 mg by mouth.     . spironolactone (ALDACTONE) 25 MG tablet Take 0.5 tablets (12.5 mg total) by mouth 2 (two) times daily. 90 tablet 3  . SYNTHROID 150 MCG tablet Take 150 mcg by mouth daily.  1  . TRULICITY 07.34MLP/3.7TKSOPN INJECT 1 SOLUTION PEN INJECTOR WEEKLY    . metFORMIN (GLUCOPHAGE-XR) 500 MG 24 hr tablet Take 1 tablet by mouth 2 (two) times daily.    . potassium chloride SA (K-DUR,KLOR-CON) 20 MEQ  tablet Take 20 mEq by mouth daily.     No current facility-administered medications for this visit.     Socially she is widowed for 24 years. She has 5 children 15 grandchildren and 17 great grandchildren. She lives at home with her 2 grandchildren. She is retired. She completed ninth grade of education. She is a former smoker but quit smoking in 1987 there prior to that she had smoked up to 2 packs per day for 20 years. There is no alcohol use.  Family History  Problem Relation Age of Onset  . Cancer - Colon Mother        mast to lungs  . Diabetes Father   . Diabetes Paternal Grandmother     ROS General: Negative; No fevers, chills, or night sweats;  HEENT: Negative; No changes in vision or hearing, sinus congestion, difficulty swallowing Pulmonary: Negative; No cough, wheezing, shortness of breath, hemoptysis Cardiovascular: Negative; No chest pain, presyncope, syncope, palpitations Occasional leg swelling GI: Negative; No nausea, vomiting, diarrhea, or abdominal pain GU: Negative; No dysuria, hematuria, or difficulty voiding Musculoskeletal: Negative; no myalgias, joint pain, or weakness Hematologic/Oncology: Positive for CML Endocrine: Positive for hypothyroidism and diabetes Neuro: Negative; no changes in balance,  headaches Skin: Negative; No rashes or skin lesions Psychiatric: Negative; No behavioral problems, depression Sleep: Previously with poor sleep which seems to have improved; no daytime sleepiness, hypersomnolence, bruxism, restless legs, hypnogognic hallucinations, no cataplexy Other comprehensive 14 point system review is negative.  PE BP (!) 141/63   Pulse 98   Temp (!) 97.1 F (36.2 C)   Ht 5' 8"  (1.727 m)   Wt 188 lb 6.4 oz (85.5 kg)   SpO2 100%   BMI 28.65 kg/m    Repeat blood pressure by me 138/64  Wt Readings from Last 3 Encounters:  08/16/19 188 lb 6.4 oz (85.5 kg)  04/28/19 196 lb 14.4 oz (89.3 kg)  10/19/18 198 lb 3.2 oz (89.9 kg)   General: Alert, oriented, no distress.  Skin: normal turgor, no rashes, warm and dry HEENT: Normocephalic, atraumatic. Pupils equal round and reactive to light; sclera anicteric; extraocular muscles intact;  Nose without nasal septal hypertrophy Mouth/Parynx benign; Mallinpatti scale 3 Neck: No JVD, no carotid bruits; normal carotid upstroke Lungs: clear to ausculatation and percussion; no wheezing or rales Chest wall: without tenderness to palpitation Heart: PMI not displaced, RRR, s1 s2 normal, 1/6 systolic murmur, no diastolic murmur, no rubs, gallops, thrills, or heaves Abdomen: soft, nontender; no hepatosplenomehaly, BS+; abdominal aorta nontender and not dilated by palpation. Back: no CVA tenderness Pulses 2+ Musculoskeletal: full range of motion, normal strength, no joint deformities Extremities: Lower extremity varicosities;no clubbing cyanosis or edema, Homan's sign negative  Neurologic: grossly nonfocal; Cranial nerves grossly wnl Psychologic: Normal mood and affect   ECG (independently read by me): Atrial fibrillation at 93; PVC; LVH  April 2019 ECG (independently read by me): Atrial fibrillation at 93 bpm.  Borderline LVH.  No significant ST change.  February 2019 ECG (independently read by me): Atrial fibrillation at 97  bpm.  QTc interval 408 ms.  July 2018 ECG (independently read by me): Atrial fibrillation at 83 bpm.  Nonspecific ST changes.  Normal intervals.  December 2017 ECG (independently read by me): Atrial fibrillation with ventricular rate at 90 bpm.  LVH by voltage.  Nonspecific ST changes.  February 2017 ECG (independently read by me):  Atrial fibrillation with ventricular rate at 78 bpm.  Nonspecific ST changes, probably secondary to digoxin effect. Normal  QTc interval.  November 2016 ECG (independently read by me): Atrial fibrillation with a ventricular rate in the 90s.  LVH by voltage.  Inferolateral ST-T changes possibly contributed by digoxin.  August 2016 ECG (independently read by me): Atrial fibrillation with rate in the 80s.  Inferolateral ST-T changes.  QTc interval 420 ms.  June 2016 ECG (independently read by me): Atrial fibrillation with rapid ventricular response with a rate December 2017 at 1 21 bpm.  Nonspecific inferolateral ST segment changes.  LABS:  BMP Latest Ref Rng & Units 04/28/2019 10/19/2018 03/22/2018  Glucose 70 - 99 mg/dL 243(H) 120(H) 128(H)  BUN 8 - 23 mg/dL 37(H) 29(H) 28(H)  Creatinine 0.44 - 1.00 mg/dL 2.02(H) 1.84(H) 1.42(H)  BUN/Creat Ratio 12 - 28 - - 20  Sodium 135 - 145 mmol/L 137 139 140  Potassium 3.5 - 5.1 mmol/L 4.5 4.7 5.0  Chloride 98 - 111 mmol/L 103 104 102  CO2 22 - 32 mmol/L 24 25 21   Calcium 8.9 - 10.3 mg/dL 9.2 9.9 9.9   Hepatic Function Latest Ref Rng & Units 04/28/2019 10/19/2018 03/22/2018  Total Protein 6.5 - 8.1 g/dL 7.3 7.6 7.1  Albumin 3.5 - 5.0 g/dL 4.4 4.5 4.9(H)  AST 15 - 41 U/L 14(L) 15 16  ALT 0 - 44 U/L 18 19 20   Alk Phosphatase 38 - 126 U/L 48 49 46  Total Bilirubin 0.3 - 1.2 mg/dL 1.2 0.8 1.0   CBC Latest Ref Rng & Units 04/28/2019 10/19/2018 03/22/2018  WBC 4.0 - 10.5 K/uL 11.6(H) 11.0(H) 10.0  Hemoglobin 12.0 - 15.0 g/dL 12.9 13.6 13.5  Hematocrit 36.0 - 46.0 % 41.6 44.3 40.4  Platelets 150 - 400 K/uL 379 400 372   Lab  Results  Component Value Date   MCV 89.3 04/28/2019   MCV 88.6 10/19/2018   MCV 84 03/22/2018   Lab Results  Component Value Date   TSH 0.543 03/22/2018  No results found for: HGBA1C   Lipid Panel     Component Value Date/Time   CHOL 136 03/22/2018 0957   TRIG 151 (H) 03/22/2018 0957   HDL 31 (L) 03/22/2018 0957   CHOLHDL 4.4 03/22/2018 0957   CHOLHDL 4.7 05/30/2015 0932   VLDL 22 05/30/2015 0932   LDLCALC 75 03/22/2018 0957    ------------------------------------------------------------------- ECHO 02/04/2018 Study Conclusions  - Left ventricle: The cavity size was moderately dilated. Wall   thickness was normal. Systolic function was mildly reduced. The   estimated ejection fraction was in the range of 45% to 50%. There   is severe hypokinesis of the septal myocardium. - Mitral valve: There was mild regurgitation. - Left atrium: The atrium was moderately dilated. - Right atrium: The atrium was moderately dilated. - Pulmonary arteries: PA peak pressure: 35 mm Hg (S).  Impressions:  - Severe septal hyokinesis with overall mild LV dysfunction;   moderate LVE; mild MR; moderate biatrial enlargement; mild TR.   RADIOLOGY: No results found.   IMPRESSION:  1. Paroxysmal atrial fibrillation (HCC)   2. Chronic atrial fibrillation   3. NICM (nonischemic cardiomyopathy) ([place])   4. Controlled type 2 diabetes mellitus without complication, without long-term current use of insulin ([place])   5. Hypothyroidism, unspecified type   6. CML (chronic myelocytic leukemia) ([place])     ASSESSMENT AND PLAN: Ms. Abrell is a 78 year old female who has a history of permanent atrial fibrillation for >20 years. She has not been on anticoagulation due to history of frequent nosebleeds in the past requiring cauterization.  She is aware of potential for CVA risk with atrial fibrillation compounding over the years.  She has been adamant against initiating anticoagulation, whether with  warfarin or with the newer agents and has been on aspirin.  Presently, her atrial fibrillation rate is in the 90s.  She has been taking digoxin 0.125 mg once a week, metoprolol 200 mg daily for rate control.  I have suggested that she change her digoxin and take 0.0625 mg every other day rather than 0.125 mg once a week for improve rate control.  Most recent echo in March 2019 showed an EF of 45 to 50% with severe hypokinesis of the septal wall, mild MR and moderate biatrial enlargement with mild pulmonary hypertension.  She is diabetic and is no longer on metformin but continues to be on glipizide and Januvia in addition to Trulicity.  She also is on Spironolactone 12.5 mg twice a day in addition to furosemide 20 mg daily and her beta-blocker for blood pressure control.  She continues to be on Tasigna for her CML.  She has hypothyroidism on levothyroxine 150 mcg.  She underwent a complete laboratory assessment yesterday by Cyndi Bender, PA-C.  I will try to obtain these results.  She is not having any chest pain symptomatology.  She denies recent bleeding.  A long as she remains stable I will see her in 1 year for reevaluation  Time spent: 25 minutes Troy Sine, MD, [facility] 08/18/2019 6:57 PM

## 2019-08-16 NOTE — Patient Instructions (Signed)
Medication Instructions:  TAKE DIGOXIN 0.0625MG  EVERY-OTHER-DAY (1/2 TAB) If you need a refill on your cardiac medications before your next appointment, please call your pharmacy.  Follow-Up: You will need a follow up appointment in 12 months.  Please call our office  months in advance, June 2021 to schedule this, September 2021 appointment.  You may see Shelva Majestic, MD or one of the following Advanced Practice Providers on your designated Care Team:  Almyra Deforest, PA-C  Fabian Sharp, PA-C     At [facility], you and your health needs are our priority.  As part of our continuing mission to provide you with exceptional heart care, we have created designated Provider Care Teams.  These Care Teams include your primary Cardiologist (physician) and Advanced Practice Providers (APPs -  Physician Assistants and Nurse Practitioners) who all work together to provide you with the care you need, when you need it.  Thank you for choosing [facility] at [facility]!!

## 2019-08-18 ENCOUNTER — Encounter: Payer: Self-pay | Admitting: Cardiovascular Disease

## 2019-08-26 DIAGNOSIS — M79671 Pain in right foot: Secondary | ICD-10-CM | POA: Diagnosis not present

## 2019-08-26 DIAGNOSIS — M25571 Pain in right ankle and joints of right foot: Secondary | ICD-10-CM | POA: Diagnosis not present

## 2019-09-22 ENCOUNTER — Other Ambulatory Visit: Payer: Self-pay | Admitting: Cardiovascular Disease

## 2019-10-03 ENCOUNTER — Telehealth: Payer: Self-pay

## 2019-10-03 ENCOUNTER — Telehealth: Payer: Self-pay | Admitting: Oncology

## 2019-10-03 NOTE — Telephone Encounter (Signed)
Faxed last 3 office notes to [facility] @ [phone]

## 2019-10-03 NOTE — Telephone Encounter (Signed)
Oral Oncology Patient Advocate Encounter  Tasigna patient assistance with [employer] will expire 11/30/19.  I called the patient and per her request mailed her the patient signature page. She signed it and mailed it back to me.  I have the renewal application completed and ready to send when the renewal period starts.  Oral Oncology clinic will continue to follow.  [place] Patient [place] Phone [phone] Fax [phone] 10/03/2019   12:10 PM

## 2019-10-12 ENCOUNTER — Encounter: Payer: Self-pay | Admitting: Sports Medicine

## 2019-10-12 ENCOUNTER — Other Ambulatory Visit: Payer: Self-pay | Admitting: Sports Medicine

## 2019-10-12 ENCOUNTER — Other Ambulatory Visit: Payer: Self-pay

## 2019-10-12 ENCOUNTER — Ambulatory Visit (INDEPENDENT_AMBULATORY_CARE_PROVIDER_SITE_OTHER): Payer: PPO | Admitting: Sports Medicine

## 2019-10-12 ENCOUNTER — Ambulatory Visit (INDEPENDENT_AMBULATORY_CARE_PROVIDER_SITE_OTHER): Payer: PPO

## 2019-10-12 DIAGNOSIS — E1142 Type 2 diabetes mellitus with diabetic polyneuropathy: Secondary | ICD-10-CM | POA: Diagnosis not present

## 2019-10-12 DIAGNOSIS — L03031 Cellulitis of right toe: Secondary | ICD-10-CM

## 2019-10-12 DIAGNOSIS — Z9181 History of falling: Secondary | ICD-10-CM

## 2019-10-12 DIAGNOSIS — L84 Corns and callosities: Secondary | ICD-10-CM

## 2019-10-12 DIAGNOSIS — R2681 Unsteadiness on feet: Secondary | ICD-10-CM

## 2019-10-12 DIAGNOSIS — S90511A Abrasion, right ankle, initial encounter: Secondary | ICD-10-CM

## 2019-10-12 DIAGNOSIS — M79671 Pain in right foot: Secondary | ICD-10-CM

## 2019-10-12 MED ORDER — NEOMYCIN-POLYMYXIN-HC 3.5-10000-1 OT SOLN: OTIC | 0 refills | Status: DC

## 2019-10-12 MED ORDER — SILVER SULFADIAZINE 1 % EX CREA: 1.0000 "application " | TOPICAL_CREAM | Freq: Every day | CUTANEOUS | 0 refills | Status: DC

## 2019-10-12 NOTE — Progress Notes (Signed)
Subjective: Sara Warner is a 78 y.o. female patient who presents to office for evaluation of right foot pain.  Patient reports that she went to the bathroom and got her foot caught on the carpet and fell down onto her foot and ankle about 9 weeks ago afterwards she had a sore spot on her ankle that stings and burns skin peeling was bruised with significant swelling now the swelling seems to be better but however the skin area at her ankle still stings and burns and reports that she tried Epson salt antibiotic cream icing with no relief. Reports that some time later she also noticed a red spot on the right great toenail and does not know how it gout there. Tried red-oil with still red and swollen. Denies drainage, nausea, vomiting, fever, or chills. Patient also reports that her callus needs to be trimmed. Patient is diabetic and reports sugar around 150s. No other issues noted.   Patient Active Problem List   Diagnosis Date Noted  . Bilateral impacted cerumen 06/23/2017  . 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 NICM (nonischemic cardiomyopathy) ([place])   . Chronic atrial fibrillation ([place])   . Hypertensive heart disease   . Stage III chronic kidney disease 10/02/2015  . Controlled type 2 diabetes mellitus without complication ([place]) 09/15/5101  . CML (chronic myelocytic leukemia) ([place]) 01/10/2013  . Atrial fibrillation ([place]) 01/05/2012  . Hypothyroid 01/05/2012  . Cardiomyopathy ([place]) 01/05/2012    Current Outpatient Medications on File Prior to Visit  Medication Sig Dispense Refill  . clorazepate (TRANXENE-T) 7.5 MG tablet Take 1 tablet (7.5 mg total) by mouth 2 (two) times daily as needed for anxiety. 30 tablet 1  . digoxin (LANOXIN) 0.125 MG tablet Take 0.0625 mg by mouth every other day. Takes on Monday only.    . furosemide (LASIX) 20 MG tablet TAKE 2 TABLETS IN THE MORNING AND AN EXTRA TABLET IF GAIN 3 OR MORE POUNDS IN  ONE DAY SCHEDULE OV. 180 tablet 0  . glipiZIDE (GLUCOTROL XL) 10 MG 24 hr tablet Take 10 mg by mouth 2 (two) times daily.    . metFORMIN (GLUCOPHAGE-XR) 500 MG 24 hr tablet Take 1 tablet by mouth 2 (two) times daily.    . metoprolol (TOPROL-XL) 200 MG 24 hr tablet TAKE 1 TABLET BY MOUTH DAILY. 90 tablet 2  . nilotinib (TASIGNA) 150 MG capsule Take 2 capsules (300 mg total) by mouth every 12 (twelve) hours. Take on an empty stomach, 1 hour before or 2 hours after meals. 120 capsule 2  . potassium chloride SA (K-DUR,KLOR-CON) 20 MEQ tablet Take 20 mEq by mouth daily.    . sitaGLIPtin (JANUVIA) 50 MG tablet Take 50 mg by mouth.     . spironolactone (ALDACTONE) 25 MG tablet Take 0.5 tablets (12.5 mg total) by mouth 2 (two) times daily. 90 tablet 3  . SYNTHROID 150 MCG tablet Take 150 mcg by mouth daily.  1  . TRULICITY 5.85 ID/7.8EU SOPN INJECT 1 SOLUTION PEN INJECTOR WEEKLY     No current facility-administered medications on file prior to visit.     Allergies  Allergen Reactions  . Losartan Other (See Comments)    Patient reports nightmares/vivid dreams when taking.  . Lisinopril Cough    Objective:  General: Alert and oriented x3 in no acute distress  Dermatology: Callus sub met 1 bilateral with no signs of infection. Abrasion to right lateral ankle with no signs of infection.  To Right great toe lateral margin there is incurvation of nail with bloody drainage and blister at distal tuft. + focal redness. No warmth. No drainage.   Vascular: Dorsalis Pedis and Posterior Tibial pedal pulses palpable 1/4, Capillary Fill Time 5 seconds,(+) pedal hair growth bilateral, Trace edema bilateral lower extremities, Temperature gradient within normal limits.  Neurology: Johney Maine sensation intact via light touch bilateral, Protective diminished bilateral.  Musculoskeletal: Mild tenderness with palpation at right 1st toe and lateral ankle, no pain to calf on right.  Gait: Minimally Antalgic gait  Xrays   Right Foot   Impression:No acute findings  Assessment and Plan: Problem List Items Addressed This Visit    None    Visit Diagnoses    Abrasion, ankle without infection, right, initial encounter    -  Primary   Paronychia of great toe of right foot       History of fall       Pre-ulcerative calluses       DM type 2 with diabetic peripheral neuropathy (HCC)       Gait instability           -Complete examination performed -Xrays reviewed -Discussed treatment options for abrasion, paronychia, callus and gait problem in the setting of Diabetes -Rx Silvadene cream to use at abrasion on right ankle -Rx Corticosporin to apply to right toe nail after soaking with betadine for 1 week -Mechanically debrided offending nail border at right lateral hallux using a sterile nail nipper without incident and then applied antibiotic cream and Band-Aid -Mechanically debrided callus sub met 1 bilateral using sterile chisel blade without incident -Encouraged patient to use walker or cane to assist with gait -Patient to return to office in 3 weeks for abrasion and toe check or sooner if condition worsens.  Landis Martins, DPM

## 2019-10-20 ENCOUNTER — Other Ambulatory Visit: Payer: Self-pay | Admitting: Sports Medicine

## 2019-10-20 DIAGNOSIS — S90511A Abrasion, right ankle, initial encounter: Secondary | ICD-10-CM

## 2019-10-23 DIAGNOSIS — G629 Polyneuropathy, unspecified: Secondary | ICD-10-CM | POA: Diagnosis not present

## 2019-10-23 DIAGNOSIS — Z6830 Body mass index (BMI) 30.0-30.9, adult: Secondary | ICD-10-CM | POA: Diagnosis not present

## 2019-10-23 DIAGNOSIS — M79604 Pain in right leg: Secondary | ICD-10-CM | POA: Diagnosis not present

## 2019-10-30 ENCOUNTER — Other Ambulatory Visit: Payer: Self-pay | Admitting: Cardiovascular Disease

## 2019-10-30 MED ORDER — DIGOXIN 125 MCG PO TABS: 0.0625 mg | ORAL_TABLET | ORAL | 1 refills | Status: DC

## 2019-10-30 NOTE — Telephone Encounter (Signed)
New Message      *STAT* If patient is at the pharmacy, call can be transferred to refill team.   1. Which medications need to be refilled? (please list name of each medication and dose if known) digoxin (LANOXIN) 0.125 MG tablet  2. Which pharmacy/location (including street and city if local pharmacy) is medication to be sent to? [id number] - [place], [address]  3. Do they need a 30 day or 90 day supply? [place]

## 2019-10-30 NOTE — Telephone Encounter (Signed)
Rx(s) sent to pharmacy electronically.  

## 2019-11-02 ENCOUNTER — Ambulatory Visit: Payer: PPO | Admitting: Sports Medicine

## 2019-11-14 DIAGNOSIS — Z1331 Encounter for screening for depression: Secondary | ICD-10-CM | POA: Diagnosis not present

## 2019-11-14 DIAGNOSIS — S81801A Unspecified open wound, right lower leg, initial encounter: Secondary | ICD-10-CM | POA: Diagnosis not present

## 2019-11-14 DIAGNOSIS — E11621 Type 2 diabetes mellitus with foot ulcer: Secondary | ICD-10-CM | POA: Diagnosis not present

## 2019-11-14 DIAGNOSIS — N184 Chronic kidney disease, stage 4 (severe): Secondary | ICD-10-CM | POA: Diagnosis not present

## 2019-11-14 DIAGNOSIS — Z6829 Body mass index (BMI) 29.0-29.9, adult: Secondary | ICD-10-CM | POA: Diagnosis not present

## 2019-11-14 DIAGNOSIS — I4891 Unspecified atrial fibrillation: Secondary | ICD-10-CM | POA: Diagnosis not present

## 2019-11-14 DIAGNOSIS — I10 Essential (primary) hypertension: Secondary | ICD-10-CM | POA: Diagnosis not present

## 2019-11-14 DIAGNOSIS — E119 Type 2 diabetes mellitus without complications: Secondary | ICD-10-CM | POA: Diagnosis not present

## 2019-11-14 DIAGNOSIS — L97519 Non-pressure chronic ulcer of other part of right foot with unspecified severity: Secondary | ICD-10-CM | POA: Diagnosis not present

## 2019-11-14 DIAGNOSIS — E890 Postprocedural hypothyroidism: Secondary | ICD-10-CM | POA: Diagnosis not present

## 2019-11-14 DIAGNOSIS — Z139 Encounter for screening, unspecified: Secondary | ICD-10-CM | POA: Diagnosis not present

## 2019-11-16 ENCOUNTER — Ambulatory Visit: Payer: PPO | Admitting: Sports Medicine

## 2019-11-17 DIAGNOSIS — S99921A Unspecified injury of right foot, initial encounter: Secondary | ICD-10-CM | POA: Diagnosis not present

## 2019-11-17 DIAGNOSIS — E11621 Type 2 diabetes mellitus with foot ulcer: Secondary | ICD-10-CM | POA: Diagnosis not present

## 2019-11-28 ENCOUNTER — Ambulatory Visit (INDEPENDENT_AMBULATORY_CARE_PROVIDER_SITE_OTHER): Payer: PPO | Admitting: Vascular Surgery

## 2019-11-28 ENCOUNTER — Encounter: Payer: Self-pay | Admitting: *Deleted

## 2019-11-28 ENCOUNTER — Other Ambulatory Visit: Payer: Self-pay

## 2019-11-28 ENCOUNTER — Encounter: Payer: Self-pay | Admitting: Vascular Surgery

## 2019-11-28 DIAGNOSIS — I998 Other disorder of circulatory system: Secondary | ICD-10-CM | POA: Diagnosis not present

## 2019-11-28 DIAGNOSIS — I70229 Atherosclerosis of native arteries of extremities with rest pain, unspecified extremity: Secondary | ICD-10-CM

## 2019-11-28 NOTE — Progress Notes (Signed)
Patient name: Sara Warner MRN: [id number] DOB: [birth year] Sex: female  REASON FOR CONSULT: Nonhealing wounds of right lower extremity  HPI: Sara Warner is a 78 y.o. 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  Patient states she got the wound approximately 4 months ago when she tripped on the carpet.  Ultimately the wounds have not healed and she now has a wound on her right shin as well as her right heel and right great toe.  She underwent noninvasive imaging in [facility] and has a severely depressed ABI of 0.21 in the right lower extremity.  Her ABI on the left is 0.53.  Patient denies any previous lower extremity interventions.  She previously walked with a cane but is now walking with a walker following her injury.  She does live at home independently.  Past Medical History:  Diagnosis Date  . Atrial fibrillation ([place])   . Chronic atrial fibrillation ([place])    a. Refuses [place].  CHA2DS2VASc = 4.  . Chronic systolic CHF (congestive heart failure) ([place])    a. 05/2015 Echo: EF 35-40%.  . CKD (chronic kidney disease), stage III   . 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 DM (diabetes mellitus) ([place])   . Elevated WBC count   . Hypertensive heart disease   . Hypothyroidism   . NICM (nonischemic cardiomyopathy) ([place])    a. 06/2013 MV: low risk study w/o ischemia;  b. 05/2015 Echo: EF 35-40%, diff HK, basal-midinferoseptal and basal-midanteroseptal AK. Mild-mod MR, mod dil LA, mild to mod TR, PASP 59mmHg.  Marland Kitchen Toxic goiter     Past Surgical History:  Procedure Laterality Date  . CARDIOVERSION    . CYSTECTOMY    . HYSTEROTOMY      Family History  Problem Relation Age of Onset  . Cancer - Colon Mother        mast to lungs  . Diabetes Father   . Diabetes Paternal  Grandmother     SOCIAL HISTORY: Social History   Socioeconomic History  . Marital status: Single    Spouse name: Not on file  . Number of children: Not on file  . Years of education: Not on file  . Highest education level: Not on file  Occupational History  . Not on file  Tobacco Use  . Smoking status: Former Smoker    Types: Cigarettes    Quit date: 01/01/1984    Years since quitting: 35.9  . Smokeless tobacco: Never Used  Substance and Sexual Activity  . Alcohol use: No  . Drug use: Never  . Sexual activity: Not on file  Other Topics Concern  . Not on file  Social History Narrative  . Not on file   Social Determinants of Health   Financial Resource Strain:   . Difficulty of Paying Living Expenses: Not on file  Food Insecurity:   . Worried About Charity fundraiser in the Last Year: Not on file  . Ran Out of Food in the Last Year: Not on file  Transportation Needs:   . Lack of Transportation (Medical): Not on file  . Lack of Transportation (Non-Medical): Not on file  Physical Activity:   . Days of Exercise per Week: Not on file  . Minutes of Exercise per Session: Not on file  Stress:   . Feeling  of Stress : Not on file  Social Connections:   . Frequency of Communication with Friends and Family: Not on file  . Frequency of Social Gatherings with Friends and Family: Not on file  . Attends Religious Services: Not on file  . Active Member of Clubs or Organizations: Not on file  . Attends Archivist Meetings: Not on file  . Marital Status: Not on file  Intimate Partner Violence:   . Fear of Current or Ex-Partner: Not on file  . Emotionally Abused: Not on file  . Physically Abused: Not on file  . Sexually Abused: Not on file    Allergies  Allergen Reactions  . Losartan Other (See Comments)    Patient reports nightmares/vivid dreams when taking.  . Lisinopril Cough    Current Outpatient Medications  Medication Sig Dispense Refill  . clorazepate  (TRANXENE-T) 7.5 MG tablet Take 1 tablet (7.5 mg total) by mouth 2 (two) times daily as needed for anxiety. 30 tablet 1  . digoxin (LANOXIN) 0.125 MG tablet Take 0.5 tablets (0.0625 mg total) by mouth every other day. 45 tablet 1  . furosemide (LASIX) 20 MG tablet TAKE 2 TABLETS IN THE MORNING AND AN EXTRA TABLET IF GAIN 3 OR MORE POUNDS IN ONE DAY SCHEDULE OV. 180 tablet 0  . glipiZIDE (GLUCOTROL XL) 10 MG 24 hr tablet Take 10 mg by mouth 2 (two) times daily.    . metoprolol (TOPROL-XL) 200 MG 24 hr tablet TAKE 1 TABLET BY MOUTH DAILY. 90 tablet 2  . neomycin-polymyxin-hydrocortisone (CORTISPORIN) OTIC solution Apply 2-3 drops to the ingrown toenail site twice daily. Cover with band-aid. 10 mL 0  . nilotinib (TASIGNA) 150 MG capsule Take 2 capsules (300 mg total) by mouth every 12 (twelve) hours. Take on an empty stomach, 1 hour before or 2 hours after meals. 120 capsule 2  . sitaGLIPtin (JANUVIA) 50 MG tablet Take 50 mg by mouth.     . SYNTHROID 150 MCG tablet Take 150 mcg by mouth daily.  1  . TRULICITY 3.29 JM/4.2AS SOPN INJECT 1 SOLUTION PEN INJECTOR WEEKLY    . metFORMIN (GLUCOPHAGE-XR) 500 MG 24 hr tablet Take 1 tablet by mouth 2 (two) times daily.    . potassium chloride SA (K-DUR,KLOR-CON) 20 MEQ tablet Take 20 mEq by mouth daily.    . silver sulfADIAZINE (SILVADENE) 1 % cream Apply 1 application topically daily. Use daily to Right ankle (Patient not taking: Reported on 11/28/2019) 20 g 0  . spironolactone (ALDACTONE) 25 MG tablet Take 0.5 tablets (12.5 mg total) by mouth 2 (two) times daily. 90 tablet 3   No current facility-administered medications for this visit.    REVIEW OF SYSTEMS:  [X]  denotes positive finding, [ ]  denotes negative finding Cardiac  Comments:  Chest pain or chest pressure:    Shortness of breath upon exertion:    Short of breath when lying flat:    Irregular heart rhythm:        Vascular    Pain in calf, thigh, or hip brought on by ambulation:    Pain in  feet at night that wakes you up from your sleep:  x right  Blood clot in your veins:    Leg swelling:         Pulmonary    Oxygen at home:    Productive cough:     Wheezing:         Neurologic    Sudden weakness in arms or legs:  Sudden numbness in arms or legs:     Sudden onset of difficulty speaking or slurred speech:    Temporary loss of vision in one eye:     Problems with dizziness:         Gastrointestinal    Blood in stool:     Vomited blood:         Genitourinary    Burning when urinating:     Blood in urine:        Psychiatric    Major depression:         Hematologic    Bleeding problems:    Problems with blood clotting too easily:        Skin    Rashes or ulcers:        Constitutional    Fever or chills:      PHYSICAL EXAM: Vitals:   11/28/19 1142  BP: (!) 147/68  Pulse: 87  Resp: 16  Temp: (!) 97.1 F (36.2 C)  TempSrc: Temporal  SpO2: 97%  Weight: 181 lb (82.1 kg)  Height: 5\' 8"  (1.727 m)    GENERAL: The patient is a well-nourished female, in no acute distress. The vital signs are documented above. CARDIAC: There is a regular rate and rhythm.  VASCULAR:  2+ left femoral pulse No palpable right femoral pulse No palpable right pedal pulses Right shin, great toe, and ankle wound as pictured below PULMONARY: There is good air exchange bilaterally without wheezing or rales. ABDOMEN: Soft and non-tender with normal pitched bowel sounds.  MUSCULOSKELETAL: There are no major deformities or cyanosis. NEUROLOGIC: No focal weakness or paresthesias are detected. PSYCHIATRIC: The patient has a normal affect.      DATA:   ABIs were done in [place] but were 0.21 on the right and 0.53 on the left  Assessment/Plan:  78 year old female with critical limb ischemia of the right lower extremity with tissue loss.  She has nonhealing wounds that have been present for the last 4 months.  She has a severely depressed ABI of 0.21 in the right lower  extremity.  I cannot appreciate a right femoral pulse suggesting likely some component of iliac disease.  She does have an easily palpable left femoral pulse.  I have recommended aortogram, right lower extremity arteriogram with possible intervention next week.  Discussed may ultimately required open surgery and/or bypass.  We will schedule as soon as possible.  Discussed high risk for limb loss even with maximal intervention.   [name], MD [facility] Office: [phone] Pager: [phone]

## 2019-12-02 ENCOUNTER — Other Ambulatory Visit (HOSPITAL_COMMUNITY)
Admission: RE | Admit: 2019-12-02 | Discharge: 2019-12-02 | Disposition: A | Discharge status: Home / Self Care | Payer: PPO | Source: Ambulatory Visit | Attending: Vascular Surgery | Admitting: Vascular Surgery

## 2019-12-02 DIAGNOSIS — Z20822 Contact with and (suspected) exposure to covid-19: Secondary | ICD-10-CM | POA: Insufficient documentation

## 2019-12-02 DIAGNOSIS — Z01812 Encounter for preprocedural laboratory examination: Secondary | ICD-10-CM | POA: Insufficient documentation

## 2019-12-04 LAB — NOVEL CORONAVIRUS, NAA (HOSP ORDER, SEND-OUT TO REF LAB; TAT 18-24 HRS): SARS-CoV-2, NAA: NOT DETECTED

## 2019-12-06 ENCOUNTER — Other Ambulatory Visit: Payer: Self-pay

## 2019-12-06 ENCOUNTER — Ambulatory Visit (HOSPITAL_BASED_OUTPATIENT_CLINIC_OR_DEPARTMENT_OTHER): Payer: PPO

## 2019-12-06 ENCOUNTER — Ambulatory Visit (HOSPITAL_COMMUNITY)
Admission: RE | Admit: 2019-12-06 | Discharge: 2019-12-06 | Disposition: A | Discharge status: Home / Self Care | Payer: PPO | Attending: Vascular Surgery | Admitting: Vascular Surgery

## 2019-12-06 ENCOUNTER — Encounter (HOSPITAL_COMMUNITY)
Admission: RE | Disposition: A | Discharge status: Home / Self Care | Payer: Self-pay | Source: Home / Self Care | Attending: Vascular Surgery

## 2019-12-06 DIAGNOSIS — I130 Hypertensive heart and chronic kidney disease with heart failure and stage 1 through stage 4 chronic kidney disease, or unspecified chronic kidney disease: Secondary | ICD-10-CM | POA: Insufficient documentation

## 2019-12-06 DIAGNOSIS — I70234 Atherosclerosis of native arteries of right leg with ulceration of heel and midfoot: Secondary | ICD-10-CM | POA: Diagnosis not present

## 2019-12-06 DIAGNOSIS — Z79890 Hormone replacement therapy (postmenopausal): Secondary | ICD-10-CM | POA: Insufficient documentation

## 2019-12-06 DIAGNOSIS — Z01810 Encounter for preprocedural cardiovascular examination: Secondary | ICD-10-CM | POA: Diagnosis not present

## 2019-12-06 DIAGNOSIS — I998 Other disorder of circulatory system: Secondary | ICD-10-CM | POA: Diagnosis not present

## 2019-12-06 DIAGNOSIS — E1151 Type 2 diabetes mellitus with diabetic peripheral angiopathy without gangrene: Secondary | ICD-10-CM | POA: Insufficient documentation

## 2019-12-06 DIAGNOSIS — I482 Chronic atrial fibrillation, unspecified: Secondary | ICD-10-CM | POA: Insufficient documentation

## 2019-12-06 DIAGNOSIS — I5022 Chronic systolic (congestive) heart failure: Secondary | ICD-10-CM | POA: Insufficient documentation

## 2019-12-06 DIAGNOSIS — E11621 Type 2 diabetes mellitus with foot ulcer: Secondary | ICD-10-CM | POA: Insufficient documentation

## 2019-12-06 DIAGNOSIS — E039 Hypothyroidism, unspecified: Secondary | ICD-10-CM | POA: Insufficient documentation

## 2019-12-06 DIAGNOSIS — Z7984 Long term (current) use of oral hypoglycemic drugs: Secondary | ICD-10-CM | POA: Insufficient documentation

## 2019-12-06 DIAGNOSIS — Z888 Allergy status to other drugs, medicaments and biological substances status: Secondary | ICD-10-CM | POA: Insufficient documentation

## 2019-12-06 DIAGNOSIS — N183 Chronic kidney disease, stage 3 unspecified: Secondary | ICD-10-CM | POA: Insufficient documentation

## 2019-12-06 DIAGNOSIS — Z79899 Other long term (current) drug therapy: Secondary | ICD-10-CM | POA: Diagnosis not present

## 2019-12-06 DIAGNOSIS — L97419 Non-pressure chronic ulcer of right heel and midfoot with unspecified severity: Secondary | ICD-10-CM | POA: Diagnosis not present

## 2019-12-06 DIAGNOSIS — I428 Other cardiomyopathies: Secondary | ICD-10-CM | POA: Insufficient documentation

## 2019-12-06 HISTORY — PX: ABDOMINAL AORTOGRAM W/LOWER EXTREMITY: CATH118223

## 2019-12-06 HISTORY — PX: PERIPHERAL VASCULAR INTERVENTION: CATH118257

## 2019-12-06 LAB — POCT ACTIVATED CLOTTING TIME: Activated Clotting Time: 180 seconds

## 2019-12-06 LAB — POCT I-STAT, CHEM 8
BUN: 26 mg/dL — ABNORMAL HIGH (ref 8–23)
Calcium, Ion: 1.19 mmol/L (ref 1.15–1.40)
Chloride: 100 mmol/L (ref 98–111)
Creatinine, Ser: 1.4 mg/dL — ABNORMAL HIGH (ref 0.44–1.00)
Glucose, Bld: 145 mg/dL — ABNORMAL HIGH (ref 70–99)
HCT: 46 % (ref 36.0–46.0)
Hemoglobin: 15.6 g/dL — ABNORMAL HIGH (ref 12.0–15.0)
Potassium: 3.6 mmol/L (ref 3.5–5.1)
Sodium: 140 mmol/L (ref 135–145)
TCO2: 27 mmol/L (ref 22–32)

## 2019-12-06 LAB — GLUCOSE, CAPILLARY: Glucose-Capillary: 174 mg/dL — ABNORMAL HIGH (ref 70–99)

## 2019-12-06 SURGERY — ABDOMINAL AORTOGRAM W/LOWER EXTREMITY
Anesthesia: LOCAL | Laterality: Right

## 2019-12-06 MED ORDER — HEPARIN (PORCINE) IN NACL 1000-0.9 UT/500ML-% IV SOLN
INTRAVENOUS | Status: DC | PRN
  Administered 2019-12-06 (×2): 500 mL

## 2019-12-06 MED ORDER — CLOPIDOGREL BISULFATE 75 MG PO TABS: 300.0000 mg | ORAL_TABLET | Freq: Once | ORAL | Status: DC

## 2019-12-06 MED ORDER — LIDOCAINE HCL (PF) 1 % IJ SOLN
INTRAMUSCULAR | Status: AC
  Filled 2019-12-06: qty 30

## 2019-12-06 MED ORDER — LABETALOL HCL 5 MG/ML IV SOLN: 10.0000 mg | INTRAVENOUS | Status: DC | PRN

## 2019-12-06 MED ORDER — FENTANYL CITRATE (PF) 100 MCG/2ML IJ SOLN
INTRAMUSCULAR | Status: AC
  Filled 2019-12-06: qty 2

## 2019-12-06 MED ORDER — CLOPIDOGREL BISULFATE 75 MG PO TABS: 75.0000 mg | ORAL_TABLET | Freq: Every day | ORAL | Status: DC

## 2019-12-06 MED ORDER — IODIXANOL 320 MG/ML IV SOLN
INTRAVENOUS | Status: DC | PRN
  Administered 2019-12-06: 60 mL

## 2019-12-06 MED ORDER — MIDAZOLAM HCL 2 MG/2ML IJ SOLN
INTRAMUSCULAR | Status: AC
  Filled 2019-12-06: qty 2

## 2019-12-06 MED ORDER — ACETAMINOPHEN 325 MG PO TABS: 650.0000 mg | ORAL_TABLET | ORAL | Status: DC | PRN

## 2019-12-06 MED ORDER — MIDAZOLAM HCL 2 MG/2ML IJ SOLN
INTRAMUSCULAR | Status: DC | PRN
  Administered 2019-12-06: 1 mg via INTRAVENOUS

## 2019-12-06 MED ORDER — SODIUM CHLORIDE 0.9% FLUSH: 3.0000 mL | INTRAVENOUS | Status: DC | PRN

## 2019-12-06 MED ORDER — HYDRALAZINE HCL 20 MG/ML IJ SOLN: 5.0000 mg | INTRAMUSCULAR | Status: DC | PRN

## 2019-12-06 MED ORDER — SODIUM CHLORIDE 0.9% FLUSH: 3.0000 mL | Freq: Two times a day (BID) | INTRAVENOUS | Status: DC

## 2019-12-06 MED ORDER — CLOPIDOGREL BISULFATE 75 MG PO TABS: 75.0000 mg | ORAL_TABLET | Freq: Every day | ORAL | 11 refills | Status: DC

## 2019-12-06 MED ORDER — SODIUM CHLORIDE 0.9 % IV SOLN: INTRAVENOUS | Status: DC

## 2019-12-06 MED ORDER — HEPARIN SODIUM (PORCINE) 1000 UNIT/ML IJ SOLN
INTRAMUSCULAR | Status: AC
  Filled 2019-12-06: qty 1

## 2019-12-06 MED ORDER — HEPARIN SODIUM (PORCINE) 1000 UNIT/ML IJ SOLN
INTRAMUSCULAR | Status: DC | PRN
  Administered 2019-12-06: 8000 [IU] via INTRAVENOUS

## 2019-12-06 MED ORDER — HEPARIN (PORCINE) IN NACL 1000-0.9 UT/500ML-% IV SOLN
INTRAVENOUS | Status: AC
  Filled 2019-12-06: qty 500

## 2019-12-06 MED ORDER — SODIUM CHLORIDE 0.9 % IV SOLN: INTRAVENOUS | Status: AC

## 2019-12-06 MED ORDER — SODIUM CHLORIDE 0.9 % IV SOLN: 250.0000 mL | INTRAVENOUS | Status: DC | PRN

## 2019-12-06 MED ORDER — LIDOCAINE HCL (PF) 1 % IJ SOLN
INTRAMUSCULAR | Status: DC | PRN
  Administered 2019-12-06: 15 mL
  Administered 2019-12-06: 14 mL

## 2019-12-06 MED ORDER — FENTANYL CITRATE (PF) 100 MCG/2ML IJ SOLN
INTRAMUSCULAR | Status: DC | PRN
  Administered 2019-12-06: 25 ug via INTRAVENOUS

## 2019-12-06 MED ORDER — CLOPIDOGREL BISULFATE 300 MG PO TABS
ORAL_TABLET | ORAL | Status: DC | PRN
  Administered 2019-12-06: 300 mg via ORAL

## 2019-12-06 MED ORDER — ONDANSETRON HCL 4 MG/2ML IJ SOLN: 4.0000 mg | Freq: Four times a day (QID) | INTRAMUSCULAR | Status: DC | PRN

## 2019-12-06 MED ORDER — CLOPIDOGREL BISULFATE 300 MG PO TABS
ORAL_TABLET | ORAL | Status: AC
  Filled 2019-12-06: qty 1

## 2019-12-06 SURGICAL SUPPLY — 29 items
BALLN MUSTANG 4X80X75 (BALLOONS) ×3
BALLN MUSTANG 6.0X100X75 (BALLOONS) ×3
BALLOON MUSTANG 4X80X75 (BALLOONS) IMPLANT
BALLOON MUSTANG 6.0X100X75 (BALLOONS) IMPLANT
CATH BEACON 5 .035 65 KMP TIP (CATHETERS) ×1 IMPLANT
CATH OMNI FLUSH 5F 65CM (CATHETERS) ×1 IMPLANT
CLOSURE MYNX CONTROL 5F (Vascular Products) ×1 IMPLANT
DEVICE CONTINUOUS FLUSH (MISCELLANEOUS) ×1 IMPLANT
DEVICE TORQUE .025-.038 (MISCELLANEOUS) ×1 IMPLANT
FILTER CO2 0.2 MICRON (VASCULAR PRODUCTS) ×1 IMPLANT
GUIDEWIRE ANGLED .035X150CM (WIRE) ×1 IMPLANT
KIT ENCORE 26 ADVANTAGE (KITS) ×1 IMPLANT
KIT MICROPUNCTURE NIT STIFF (SHEATH) ×1 IMPLANT
KIT PV (KITS) ×3 IMPLANT
RESERVOIR CO2 (VASCULAR PRODUCTS) ×1 IMPLANT
SET FLUSH CO2 (MISCELLANEOUS) ×1 IMPLANT
SHEATH BRITE TIP 6FR 35CM (SHEATH) ×1 IMPLANT
SHEATH PINNACLE 5F 10CM (SHEATH) ×2 IMPLANT
SHEATH PINNACLE 6F 10CM (SHEATH) ×1 IMPLANT
SHEATH PROBE COVER 6X72 (BAG) ×2 IMPLANT
STENT ELUVIA 7X100X130 (Permanent Stent) ×1 IMPLANT
STENT ELUVIA 7X40X130 (Permanent Stent) IMPLANT
STENT ELUVIA 7X60X130 (Permanent Stent) ×1 IMPLANT
SYR MEDRAD MARK V 150ML (SYRINGE) ×1 IMPLANT
TRANSDUCER W/STOPCOCK (MISCELLANEOUS) ×3 IMPLANT
TRAY PV CATH (CUSTOM PROCEDURE TRAY) ×3 IMPLANT
WIRE BENTSON .035X145CM (WIRE) ×1 IMPLANT
WIRE ROSEN-J .035X180CM (WIRE) ×1 IMPLANT
WIRE ROSEN-J .035X260CM (WIRE) ×1 IMPLANT

## 2019-12-06 NOTE — Op Note (Signed)
Patient name: Sara Warner MRN: [id number] DOB: 11-Sep-1941 Sex: female  12/06/2019 Pre-operative Diagnosis: Critical limb ischemia of the right lower extremity with tissue loss Post-operative diagnosis:  Same Surgeon:  [name], MD Procedure Performed: 1.  Ultrasound-guided access of the left common femoral artery 2.  CO2 aortogram with attempted bilateral lower extremity CO2 arteriogram 3.  Ultrasound-guided access of the right common femoral artery 4.  Right external iliac artery angioplasty with stent placement (predilated with 4 mm x 80 mm Mustang, stented with 7 mm x 100 mm drug-coated Lutonix and 7 mm x 60 mm drug-coated Lutonix, stent postdilated with a 6 mm Mustang) 5.  Mynx closure of the left common femoral artery 6.  73 minutes of monitored moderate conscious sedation time  Indications: Patient is a 79 year old female who presented to clinic with critical limb ischemia of the right lower extremity with multiple nonhealing wounds as well as severely depressed ABI of 0.21 in the right lower extremity.  She presents today for aortogram, right lower extremity arteriogram, with possible intervention.  She was noted to have no right femoral pulse in clinic and suspect she had some iliac or common femoral disease.  Risk and benefits have been discussed with the patient including high risk for limb loss with no intervention.  Findings:   Aortogram showed right external iliac occlusion with reconstitution of the distal external iliac just above the femoral head and reconstitution of the common femoral.  Ultimately with CO2 runoff, I could not visualize any distal target beyond her iliac occlusion with CO2 on the right.  Ultimately accessed the right common femoral artery with retrograde access was able to cross the occlusion back into the native right common iliac artery.  The right external iliac was then predilated with a 4 mm balloon and then stented with a 7 mm drug-coated  Lutonix x2 and postdilated with a 6 mm balloon.  She now has a much better femoral pulse.  Right lower extremity runoff after stent of right external iliac confirms flush SFA occlusion with reconstitution of a distal SFA above-knee popliteal artery and a patent below-knee popliteal artery and apparent three-vessel runoff with dominant runoff into the foot being the posterior tibial.   Procedure:  The patient was identified in the holding area and taken to room 8.  The patient was then placed supine on the table and prepped and draped in the usual sterile fashion.  A time out was called.  Ultrasound was used to evaluate the left common femoral artery.  It was patent .  A digital ultrasound image was acquired.  A micropuncture needle was used to access the left common femoral artery under ultrasound guidance.  An 018 wire was advanced without resistance and a micropuncture sheath was placed.  The 018 wire was removed and a benson wire was placed.  The micropuncture sheath was exchanged for a 5 french sheath.  An omniflush catheter was advanced over the wire to the level of L-1.  An abdominal angiogram was obtained with CO2.  This confirmed the right external iliac occlusion.  We then pulled the catheter down and attempted to get runoff of bilateral lower extremities with CO2 given her renal dysfunction.  Unfortunately it was very limited evaluation.  As a result elected to fix the right external iliac and then get additional images of the right lower extremity.  I then used ultrasound to evaluate the right common femoral artery and although there was no pulse the artery appeared  patent and the image was saved.  The right common femoral artery was accessed with a micro access needle placed a microwire and a microsheath.  I then advanced a short J-wire and put a 5 French sheath in the right groin.  Patient was then given 100 units per kilogram heparin.  Through this I then used a soft angled glidewire with a KMP to  cross the right external iliac occlusion back into the native right common iliac and infrarenal aorta.  I confirm with hand-injection I was in the true lumen.  We then got dedicated iliac imaging with left oblique position of the C arm to better define the anatomy.  I exchanged for a rosen wire for more support and long 6 French bright tip sheath.  Ultimately the entire lesion was predilated with a 4 mm Mustang.  I elected then to stent this with a 7 mm drug-coated Lutonix initially used a 7 mm x 100 mm Lutonix and postdilated this with a 6 mm balloon.  There was residual lesion distal to where the stent ended in the distal external iliac.  I then extended with a 7 mm x 60 mm drug-coated Lutonix and again postdilated this with a 6 mm balloon.  She now has a nice palpable right femoral pulse.  I then shot runoff of the right lower extremity through injection of a short 6 French sheath in the right groin.  Ultimately this reaffirmed a flush SFA occlusion.  She does reconstitute a mid to distal SFA but severely diseased and her below-knee popliteal artery certainly looks healthier.  She does appear to have a patent trifurcation with three-vessel runoff.  Dominant runoff appears to be from the posterior tibial based on our evaluation.  At that point in time I performed closure of the left common femoral artery with a mynx and the 5 Fr sheath was removed.  I did not want to mynx the right groin given plans for right femoral to distal bypass.  Should was taken to PACU for removal of the right groin sheath in stable condition.  Plan: Right external iliac occlusion was treated today with stent x2.  She will need vein mapping and need to be considered for right common femoral to popliteal bypass given flush SFA occlusion.  She was loaded on Plavix today.  [name], MD [facility] [place] Office: [place]

## 2019-12-06 NOTE — Progress Notes (Addendum)
Site area: rt groin fa sheath Site Prior to Removal:  Level 0 Pressure Applied For: 20 minutes Manual:   yes Patient Status During Pull:  stable Post Pull Site:  Level 0 Post Pull Instructions Given:  yes Post Pull Pulses Present: rt dp dopplered Dressing Applied:  Gauze and tegaderm Bedrest begins @ 1350 Comments:

## 2019-12-06 NOTE — Discharge Instructions (Signed)
Femoral Site Care This sheet gives you information about how to care for yourself after your procedure. Your health care provider may also give you more specific instructions. If you have problems or questions, contact your health care provider. What can I expect after the procedure? After the procedure, it is common to have:  Bruising that usually fades within 1-2 weeks.  Tenderness at the site. Follow these instructions at home: Wound care  Follow instructions from your health care provider about how to take care of your insertion site. Make sure you: ? Wash your hands with soap and water before you change your bandage (dressing). If soap and water are not available, use hand sanitizer. ? Change your dressing as told by your health care provider. ? Leave stitches (sutures), skin glue, or adhesive strips in place. These skin closures may need to stay in place for 2 weeks or longer. If adhesive strip edges start to loosen and curl up, you may trim the loose edges. Do not remove adhesive strips completely unless your health care provider tells you to do that.  Do not take baths, swim, or use a hot tub until your health care provider approves.  You may shower 24-48 hours after the procedure or as told by your health care provider. ? Gently wash the site with plain soap and water. ? Pat the area dry with a clean towel. ? Do not rub the site. This may cause bleeding.  Do not apply powder or lotion to the site. Keep the site clean and dry.  Check your femoral site every day for signs of infection. Check for: ? Redness, swelling, or pain. ? Fluid or blood. ? Warmth. ? Pus or a bad smell. Activity  For the first 2-3 days after your procedure, or as long as directed: ? Avoid climbing stairs as much as possible. ? Do not squat.  Do not lift anything that is heavier than 10 lb (4.5 kg), or the limit that you are told, until your health care provider says that it is safe.  Rest as  directed. ? Avoid sitting for a long time without moving. Get up to take short walks every 1-2 hours.  Do not drive for 24 hours if you were given a medicine to help you relax (sedative). General instructions  Take over-the-counter and prescription medicines only as told by your health care provider.  Keep all follow-up visits as told by your health care provider. This is important. Contact a health care provider if you have:  A fever or chills.  You have redness, swelling, or pain around your insertion site. Get help right away if:  The catheter insertion area swells very fast.  You pass out.  You suddenly start to sweat or your skin gets clammy.  The catheter insertion area is bleeding, and the bleeding does not stop when you hold steady pressure on the area.  The area near or just beyond the catheter insertion site becomes pale, cool, tingly, or numb. These symptoms may represent a serious problem that is an emergency. Do not wait to see if the symptoms will go away. Get medical help right away. Call your local emergency services (911 in the [place] Do not drive yourself to the hospital. Summary  After the procedure, it is common to have bruising that usually fades within 1-2 weeks.  Check your femoral site every day for signs of infection.  Do not lift anything that is heavier than 10 lb (4.5 kg), or the   limit that you are told, until your health care provider says that it is safe. This information is not intended to replace advice given to you by your health care provider. Make sure you discuss any questions you have with your health care provider. Document Revised: 11/29/2017 Document Reviewed: 11/29/2017 Elsevier Patient Education  2020 [employer]  

## 2019-12-06 NOTE — Progress Notes (Signed)
Bilateral lower extremity vein mapping completed. Refer to "CV Proc" under chart review to view preliminary results.  12/06/2019 4:27 PM Kelby Aline., MHA, RVT, RDCS, RDMS

## 2019-12-06 NOTE — H&P (Signed)
History and Physical Interval Note:  12/06/2019 10:32 AM  Sara Warner  has presented today for surgery, with the diagnosis of PVD with ulcer.  The various methods of treatment have been discussed with the patient and family. After consideration of risks, benefits and other options for treatment, the patient has consented to  Procedure(s): ABDOMINAL AORTOGRAM W/LOWER EXTREMITY (N/A) as a surgical intervention.  The patient's history has been reviewed, patient examined, no change in status, stable for surgery.  I have reviewed the patient's chart and labs.  Questions were answered to the patient's satisfaction.     [name]  Patient name: Sara Warner MRN: [id number] DOB: Jan 26, 1941 Sex: female  REASON FOR CONSULT: Nonhealing wounds of right lower extremity  HPI:  Sara Warner is a 79 y.o. 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 Patient states she got the wound approximately 4 months ago when she tripped on the carpet. Ultimately the wounds have not healed and she now has a wound on her right shin as well as her right heel and right great toe. She underwent noninvasive imaging in [facility] and has a severely depressed ABI of 0.21 in the right lower extremity. Her ABI on the left is 0.53. Patient denies any previous lower extremity interventions. She previously walked with a cane but is now walking with a walker following her injury. She does live at home independently.      Past Medical History:  Diagnosis Date  . Atrial fibrillation ([place])   . Chronic atrial fibrillation ([place])    a. Refuses [place]. CHA2DS2VASc = 4.  . Chronic systolic CHF (congestive heart failure) ([place])    a. 05/2015 Echo: EF 35-40%.  . CKD (chronic kidney disease), stage III   . 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 DM (diabetes mellitus) ([place])   . Elevated WBC count   . Hypertensive heart disease   . Hypothyroidism   . NICM (nonischemic cardiomyopathy) ([place])    a. 06/2013 MV: low risk study w/o ischemia; b. 05/2015 Echo: EF 35-40%, diff HK, basal-midinferoseptal and basal-midanteroseptal AK. Mild-mod MR, mod dil LA, mild to mod TR, PASP 61mmHg.  Marland Kitchen Toxic goiter         Past Surgical History:  Procedure Laterality Date  . CARDIOVERSION    . CYSTECTOMY    . HYSTEROTOMY          Family History  Problem Relation Age of Onset  . Cancer - Colon Mother    mast to lungs  . Diabetes Father   . Diabetes Paternal Grandmother    SOCIAL HISTORY:  Social History        Socioeconomic History  . Marital status: Single    Spouse name: Not on file  . Number of children: Not on file  . Years of education: Not on file  . Highest education level: Not on file  Occupational History  . Not on file  Tobacco Use  . Smoking status: Former Smoker    Types: Cigarettes    Quit date: 01/01/1984    Years since quitting: 35.9  . Smokeless tobacco: Never Used  Substance and Sexual Activity  . Alcohol use: No  . Drug use: Never  . Sexual activity: Not on file  Other Topics Concern  . Not on file  Social History Narrative  .  Not on file   Social Determinants of Health      Financial Resource Strain:   . Difficulty of Paying Living Expenses: Not on file  Food Insecurity:   . Worried About Charity fundraiser in the Last Year: Not on file  . Ran Out of Food in the Last Year: Not on file  Transportation Needs:   . Lack of Transportation (Medical): Not on file  . Lack of Transportation (Non-Medical): Not on file  Physical Activity:   . Days of Exercise per Week: Not on file  . Minutes of Exercise per Session: Not on file  Stress:   . Feeling of Stress : Not on file  Social Connections:   . Frequency of Communication with Friends and Family: Not on file  . Frequency of Social  Gatherings with Friends and Family: Not on file  . Attends Religious Services: Not on file  . Active Member of Clubs or Organizations: Not on file  . Attends Archivist Meetings: Not on file  . Marital Status: Not on file  Intimate Partner Violence:   . Fear of Current or Ex-Partner: Not on file  . Emotionally Abused: Not on file  . Physically Abused: Not on file  . Sexually Abused: Not on file        Allergies  Allergen Reactions  . Losartan Other (See Comments)    Patient reports nightmares/vivid dreams when taking.  . Lisinopril Cough         Current Outpatient Medications  Medication Sig Dispense Refill  . clorazepate (TRANXENE-T) 7.5 MG tablet Take 1 tablet (7.5 mg total) by mouth 2 (two) times daily as needed for anxiety. 30 tablet 1  . digoxin (LANOXIN) 0.125 MG tablet Take 0.5 tablets (0.0625 mg total) by mouth every other day. 45 tablet 1  . furosemide (LASIX) 20 MG tablet TAKE 2 TABLETS IN THE MORNING AND AN EXTRA TABLET IF GAIN 3 OR MORE POUNDS IN ONE DAY SCHEDULE OV. 180 tablet 0  . glipiZIDE (GLUCOTROL XL) 10 MG 24 hr tablet Take 10 mg by mouth 2 (two) times daily.    . metoprolol (TOPROL-XL) 200 MG 24 hr tablet TAKE 1 TABLET BY MOUTH DAILY. 90 tablet 2  . neomycin-polymyxin-hydrocortisone (CORTISPORIN) OTIC solution Apply 2-3 drops to the ingrown toenail site twice daily. Cover with band-aid. 10 mL 0  . nilotinib (TASIGNA) 150 MG capsule Take 2 capsules (300 mg total) by mouth every 12 (twelve) hours. Take on an empty stomach, 1 hour before or 2 hours after meals. 120 capsule 2  . sitaGLIPtin (JANUVIA) 50 MG tablet Take 50 mg by mouth.     . SYNTHROID 150 MCG tablet Take 150 mcg by mouth daily.  1  . TRULICITY 6.44 IH/4.7QQ SOPN INJECT 1 SOLUTION PEN INJECTOR WEEKLY    . metFORMIN (GLUCOPHAGE-XR) 500 MG 24 hr tablet Take 1 tablet by mouth 2 (two) times daily.    . potassium chloride SA (K-DUR,KLOR-CON) 20 MEQ tablet Take 20 mEq by mouth daily.    . silver  sulfADIAZINE (SILVADENE) 1 % cream Apply 1 application topically daily. Use daily to Right ankle (Patient not taking: Reported on 11/28/2019) 20 g 0  . spironolactone (ALDACTONE) 25 MG tablet Take 0.5 tablets (12.5 mg total) by mouth 2 (two) times daily. 90 tablet 3   No current facility-administered medications for this visit.   REVIEW OF SYSTEMS:  [X]  denotes positive finding, [ ]  denotes negative finding  Cardiac  Comments:  Chest  pain or chest pressure:    Shortness of breath upon exertion:    Short of breath when lying flat:    Irregular heart rhythm:        Vascular    Pain in calf, thigh, or hip brought on by ambulation:    Pain in feet at night that wakes you up from your sleep:  x right  Blood clot in your veins:    Leg swelling:         Pulmonary    Oxygen at home:    Productive cough:     Wheezing:         Neurologic    Sudden weakness in arms or legs:     Sudden numbness in arms or legs:     Sudden onset of difficulty speaking or slurred speech:    Temporary loss of vision in one eye:     Problems with dizziness:         Gastrointestinal    Blood in stool:     Vomited blood:         Genitourinary    Burning when urinating:     Blood in urine:        Psychiatric    Major depression:         Hematologic    Bleeding problems:    Problems with blood clotting too easily:        Skin    Rashes or ulcers:        Constitutional    Fever or chills:    PHYSICAL EXAM:     Vitals:   11/28/19 1142  BP: (!) 147/68  Pulse: 87  Resp: 16  Temp: (!) 97.1 F (36.2 C)  TempSrc: Temporal  SpO2: 97%  Weight: 181 lb (82.1 kg)  Height: 5\' 8"  (1.727 m)   GENERAL: The patient is a well-nourished female, in no acute distress. The vital signs are documented above.  CARDIAC: There is a regular rate and rhythm.  VASCULAR:  2+ left femoral pulse  No palpable right femoral pulse  No palpable right pedal pulses  Right shin, great toe, and ankle wound as pictured  below  PULMONARY: There is good air exchange bilaterally without wheezing or rales.  ABDOMEN: Soft and non-tender with normal pitched bowel sounds.  MUSCULOSKELETAL: There are no major deformities or cyanosis.  NEUROLOGIC: No focal weakness or paresthesias are detected.  PSYCHIATRIC: The patient has a normal affect.   DATA:  ABIs were done in [place] but were 0.21 on the right and 0.53 on the left  Assessment/Plan:  79 year old female with critical limb ischemia of the right lower extremity with tissue loss. She has nonhealing wounds that have been present for the last 4 months. She has a severely depressed ABI of 0.21 in the right lower extremity. I cannot appreciate a right femoral pulse suggesting likely some component of iliac disease. She does have an easily palpable left femoral pulse. I have recommended aortogram, right lower extremity arteriogram with possible intervention next week. Discussed may ultimately required open surgery and/or bypass. We will schedule as soon as possible. Discussed high risk for limb loss even with maximal intervention.  [name], MD  [facility]  Office: [phone]  Pager: [phone]

## 2019-12-12 NOTE — Progress Notes (Signed)
[id number] - [place], [place] [address] Phone: [phone] Fax: [phone]      Your procedure is scheduled on Friday, January 15th.  Report to [facility] Main Entrance "A" at 9:00 A.M., and check in at the Admitting office.  Call this number if you have problems the morning of surgery:  [phone]  Call [phone] if you have any questions prior to your surgery date Monday-Friday 8am-4pm    Remember:  Do not eat or drink after midnight the night before your surgery   Take these medicines the morning of surgery with A SIP OF WATER  digoxin (LANOXIN)-if scheduled dose falls on day of surgery.   metoprolol (TOPROL-XL) nilotinib (TASIGNA) SYNTHROID  clorazepate (TRANXENE-T) -as needed for anxiety  Follow your surgeon's instructions on when to stop/resume clopidogrel (PLAVIX).  If no instructions were given by your surgeon then you will need to call the office to get those instructions.     As of today, STOP taking any Aspirin (unless otherwise instructed by your surgeon), Aleve, Naproxen, Ibuprofen, Motrin, Advil, Goody's, BC's, all herbal medications, fish oil, and all vitamins.   WHAT DO I DO ABOUT MY DIABETES MEDICATION?   Marland Kitchen Do not take oral diabetes medicines (pills) the morning of surgery. sitaGLIPtin (JANUVIA) and glipiZIDE (GLUCOTROL XL).   . Hold your EVENING dose of glipiZIDE (GLUCOTROL XL) the night before surgery.   . The day of surgery, do not take other diabetes injectables, including Byetta (exenatide), Bydureon (exenatide ER), Victoza (liraglutide), or Trulicity (dulaglutide).   HOW TO MANAGE YOUR DIABETES BEFORE AND AFTER SURGERY  Why is it important to control my blood sugar before and after surgery? . Improving blood sugar levels before and after surgery helps healing and can limit problems. . A way of improving blood sugar control is eating a healthy diet by: o   Eating less sugar and carbohydrates o  Increasing activity/exercise o  Talking with your doctor about reaching your blood sugar goals . High blood sugars (greater than 180 mg/dL) can raise your risk of infections and slow your recovery, so you will need to focus on controlling your diabetes during the weeks before surgery. . Make sure that the doctor who takes care of your diabetes knows about your planned surgery including the date and location.  How do I manage my blood sugar before surgery? . Check your blood sugar at least 4 times a day, starting 2 days before surgery, to make sure that the level is not too high or low. . Check your blood sugar the morning of your surgery when you wake up and every 2 hours until you get to the Short Stay unit. o If your blood sugar is less than 70 mg/dL, you will need to treat for low blood sugar: - Do not take insulin. - Treat a low blood sugar (less than 70 mg/dL) with  cup of clear juice (cranberry or apple), 4 glucose tablets, OR glucose gel. - Recheck blood sugar in 15 minutes after treatment (to make sure it is greater than 70 mg/dL). If your blood sugar is not greater than 70 mg/dL on recheck, call [phone] for further instructions. . Report your blood sugar to the short stay nurse when you get to Short Stay.  . If you are admitted to the hospital after surgery: o Your blood sugar will be checked by the staff and you will probably be given insulin after surgery (  instead of oral diabetes medicines) to make sure you have good blood sugar levels. o The goal for blood sugar control after surgery is 80-180 mg/dL.      The Morning of Surgery  Do not wear jewelry, make-up or nail polish.  Do not wear lotions, powders, or perfumes, or deodorant  Do not shave 48 hours prior to surgery.    Do not bring valuables to the hospital.  [facility] is not responsible for any belongings or valuables.  If you are a smoker, DO NOT Smoke 24 hours prior to  surgery  If you wear a CPAP at night please bring your mask, tubing, and machine the morning of surgery   Remember that you must have someone to transport you home after your surgery, and remain with you for 24 hours if you are discharged the same day.   Please bring cases for contacts, glasses, hearing aids, dentures or bridgework because it cannot be worn into surgery.    Leave your suitcase in the car.  After surgery it may be brought to your room.  For patients admitted to the hospital, discharge time will be determined by your treatment team.  Patients discharged the day of surgery will not be allowed to drive home.    Special instructions:   [place]- Preparing For Surgery  Before surgery, you can play an important role. Because skin is not sterile, your skin needs to be as free of germs as possible. You can reduce the number of germs on your skin by washing with CHG (chlorahexidine gluconate) Soap before surgery.  CHG is an antiseptic cleaner which kills germs and bonds with the skin to continue killing germs even after washing.    Oral Hygiene is also important to reduce your risk of infection.  Remember - BRUSH YOUR TEETH THE MORNING OF SURGERY WITH YOUR REGULAR TOOTHPASTE  Please do not use if you have an allergy to CHG or antibacterial soaps. If your skin becomes reddened/irritated stop using the CHG.  Do not shave (including legs and underarms) for at least 48 hours prior to first CHG shower. It is OK to shave your face.  Please follow these instructions carefully.   1. Shower the NIGHT BEFORE SURGERY and the MORNING OF SURGERY with CHG Soap.   2. If you chose to wash your hair, wash your hair first as usual with your normal shampoo.  3. After you shampoo, rinse your hair and body thoroughly to remove the shampoo.  4. Use CHG as you would any other liquid soap. You can apply CHG directly to the skin and wash gently with a scrungie or a clean washcloth.   5. Apply  the CHG Soap to your body ONLY FROM THE NECK DOWN.  Do not use on open wounds or open sores. Avoid contact with your eyes, ears, mouth and genitals (private parts). Wash Face and genitals (private parts)  with your normal soap.   6. Wash thoroughly, paying special attention to the area where your surgery will be performed.  7. Thoroughly rinse your body with warm water from the neck down.  8. DO NOT shower/wash with your normal soap after using and rinsing off the CHG Soap.  9. Pat yourself dry with a CLEAN TOWEL.  10. Wear CLEAN PAJAMAS to bed the night before surgery, wear comfortable clothes the morning of surgery  11. Place CLEAN SHEETS on your bed the night of your first shower and DO NOT SLEEP WITH PETS.  Day of Surgery:  Please shower the morning of surgery with the CHG soap Do not apply any deodorants/lotions. Please wear clean clothes to the hospital/surgery center.   Remember to brush your teeth WITH YOUR REGULAR TOOTHPASTE.   Please read over the following fact sheets that you were given.

## 2019-12-13 ENCOUNTER — Other Ambulatory Visit (HOSPITAL_COMMUNITY)
Admission: RE | Admit: 2019-12-13 | Discharge: 2019-12-13 | Disposition: A | Discharge status: Home / Self Care | Payer: PPO | Source: Ambulatory Visit | Attending: Vascular Surgery | Admitting: Vascular Surgery

## 2019-12-13 ENCOUNTER — Encounter (HOSPITAL_COMMUNITY)
Admission: RE | Admit: 2019-12-13 | Discharge: 2019-12-13 | Disposition: A | Discharge status: Home / Self Care | Payer: PPO | Source: Ambulatory Visit | Attending: Vascular Surgery | Admitting: Vascular Surgery

## 2019-12-13 ENCOUNTER — Encounter (HOSPITAL_COMMUNITY): Payer: Self-pay

## 2019-12-13 ENCOUNTER — Other Ambulatory Visit: Payer: Self-pay

## 2019-12-13 DIAGNOSIS — I482 Chronic atrial fibrillation, unspecified: Secondary | ICD-10-CM | POA: Diagnosis present

## 2019-12-13 DIAGNOSIS — I132 Hypertensive heart and chronic kidney disease with heart failure and with stage 5 chronic kidney disease, or end stage renal disease: Secondary | ICD-10-CM | POA: Insufficient documentation

## 2019-12-13 DIAGNOSIS — Z20822 Contact with and (suspected) exposure to covid-19: Secondary | ICD-10-CM | POA: Diagnosis present

## 2019-12-13 DIAGNOSIS — Z87891 Personal history of nicotine dependence: Secondary | ICD-10-CM | POA: Diagnosis not present

## 2019-12-13 DIAGNOSIS — I5022 Chronic systolic (congestive) heart failure: Secondary | ICD-10-CM | POA: Diagnosis present

## 2019-12-13 DIAGNOSIS — I70221 Atherosclerosis of native arteries of extremities with rest pain, right leg: Secondary | ICD-10-CM | POA: Diagnosis present

## 2019-12-13 DIAGNOSIS — E1151 Type 2 diabetes mellitus with diabetic peripheral angiopathy without gangrene: Secondary | ICD-10-CM | POA: Diagnosis present

## 2019-12-13 DIAGNOSIS — I998 Other disorder of circulatory system: Secondary | ICD-10-CM | POA: Diagnosis present

## 2019-12-13 DIAGNOSIS — Z9221 Personal history of antineoplastic chemotherapy: Secondary | ICD-10-CM | POA: Diagnosis not present

## 2019-12-13 DIAGNOSIS — I129 Hypertensive chronic kidney disease with stage 1 through stage 4 chronic kidney disease, or unspecified chronic kidney disease: Secondary | ICD-10-CM | POA: Diagnosis not present

## 2019-12-13 DIAGNOSIS — I130 Hypertensive heart and chronic kidney disease with heart failure and stage 1 through stage 4 chronic kidney disease, or unspecified chronic kidney disease: Secondary | ICD-10-CM | POA: Diagnosis present

## 2019-12-13 DIAGNOSIS — N183 Chronic kidney disease, stage 3 unspecified: Secondary | ICD-10-CM | POA: Diagnosis present

## 2019-12-13 DIAGNOSIS — Z833 Family history of diabetes mellitus: Secondary | ICD-10-CM | POA: Diagnosis not present

## 2019-12-13 DIAGNOSIS — Z01812 Encounter for preprocedural laboratory examination: Secondary | ICD-10-CM | POA: Insufficient documentation

## 2019-12-13 DIAGNOSIS — I428 Other cardiomyopathies: Secondary | ICD-10-CM | POA: Diagnosis present

## 2019-12-13 DIAGNOSIS — E039 Hypothyroidism, unspecified: Secondary | ICD-10-CM | POA: Diagnosis present

## 2019-12-13 DIAGNOSIS — I509 Heart failure, unspecified: Secondary | ICD-10-CM | POA: Diagnosis not present

## 2019-12-13 DIAGNOSIS — Z7984 Long term (current) use of oral hypoglycemic drugs: Secondary | ICD-10-CM | POA: Diagnosis not present

## 2019-12-13 DIAGNOSIS — C9110 Chronic lymphocytic leukemia of B-cell type not having achieved remission: Secondary | ICD-10-CM | POA: Diagnosis present

## 2019-12-13 DIAGNOSIS — E1122 Type 2 diabetes mellitus with diabetic chronic kidney disease: Secondary | ICD-10-CM | POA: Diagnosis present

## 2019-12-13 DIAGNOSIS — Z79899 Other long term (current) drug therapy: Secondary | ICD-10-CM | POA: Diagnosis not present

## 2019-12-13 DIAGNOSIS — Z79890 Hormone replacement therapy (postmenopausal): Secondary | ICD-10-CM | POA: Diagnosis not present

## 2019-12-13 HISTORY — DX: Cardiac arrhythmia, unspecified: I49.9

## 2019-12-13 HISTORY — DX: Pneumonia, unspecified organism: J18.9

## 2019-12-13 LAB — URINALYSIS, ROUTINE W REFLEX MICROSCOPIC
Bacteria, UA: NONE SEEN
Bilirubin Urine: NEGATIVE
Glucose, UA: NEGATIVE mg/dL
Hgb urine dipstick: NEGATIVE
Ketones, ur: NEGATIVE mg/dL
Nitrite: NEGATIVE
Protein, ur: NEGATIVE mg/dL
Specific Gravity, Urine: 1.005 (ref 1.005–1.030)
pH: 6 (ref 5.0–8.0)

## 2019-12-13 LAB — CBC
HCT: 42.3 % (ref 36.0–46.0)
Hemoglobin: 13 g/dL (ref 12.0–15.0)
MCH: 27.5 pg (ref 26.0–34.0)
MCHC: 30.7 g/dL (ref 30.0–36.0)
MCV: 89.4 fL (ref 80.0–100.0)
Platelets: 514 10*3/uL — ABNORMAL HIGH (ref 150–400)
RBC: 4.73 MIL/uL (ref 3.87–5.11)
RDW: 16.1 % — ABNORMAL HIGH (ref 11.5–15.5)
WBC: 14.6 10*3/uL — ABNORMAL HIGH (ref 4.0–10.5)
nRBC: 0 % (ref 0.0–0.2)

## 2019-12-13 LAB — COMPREHENSIVE METABOLIC PANEL
ALT: 24 U/L (ref 0–44)
AST: 20 U/L (ref 15–41)
Albumin: 4.2 g/dL (ref 3.5–5.0)
Alkaline Phosphatase: 44 U/L (ref 38–126)
Anion gap: 10 (ref 5–15)
BUN: 19 mg/dL (ref 8–23)
CO2: 25 mmol/L (ref 22–32)
Calcium: 9.1 mg/dL (ref 8.9–10.3)
Chloride: 102 mmol/L (ref 98–111)
Creatinine, Ser: 1.6 mg/dL — ABNORMAL HIGH (ref 0.44–1.00)
GFR calc Af Amer: 35 mL/min — ABNORMAL LOW (ref >60)
GFR calc non Af Amer: 31 mL/min — ABNORMAL LOW (ref >60)
Glucose, Bld: 87 mg/dL (ref 70–99)
Potassium: 3.9 mmol/L (ref 3.5–5.1)
Sodium: 137 mmol/L (ref 135–145)
Total Bilirubin: 1.4 mg/dL — ABNORMAL HIGH (ref 0.3–1.2)
Total Protein: 7.3 g/dL (ref 6.5–8.1)

## 2019-12-13 LAB — APTT: aPTT: 33 seconds (ref 24–36)

## 2019-12-13 LAB — PROTIME-INR
INR: 1.1 (ref 0.8–1.2)
Prothrombin Time: 14.2 seconds (ref 11.4–15.2)

## 2019-12-13 LAB — TYPE AND SCREEN
ABO/RH(D): A POS
Antibody Screen: NEGATIVE

## 2019-12-13 LAB — SURGICAL PCR SCREEN
MRSA, PCR: NEGATIVE
Staphylococcus aureus: NEGATIVE

## 2019-12-13 LAB — GLUCOSE, CAPILLARY: Glucose-Capillary: 89 mg/dL (ref 70–99)

## 2019-12-13 LAB — ABO/RH: ABO/RH(D): A POS

## 2019-12-13 LAB — SARS CORONAVIRUS 2 (TAT 6-24 HRS): SARS Coronavirus 2: NEGATIVE

## 2019-12-13 NOTE — Progress Notes (Signed)
PCP - Cyndi Bender, PA @ [facility] in [place] -     Chest x-ray - na EKG - 08/16/19 Stress Test - 02/14/18 ECHO - 2014 Cardiac Cath - na  Sleep Study - na CPAP -   Fasting Blood Sugar - 150 Checks Blood Sugar __1___ times a week   Request hgb A1c results from [facility], [place],  Blood Thinner Instructions: No one has instructed pt. Called Dr. Ainsley Spinner office, [place] will follow up with pt.  Pt. Has several areas of skin break down: Right big toe-black 1 cm  Right side of big toe.black 1cm and blistered Right heel 2 areas 1 cm each, black Right lateral side of heel 2 areas 1 cm each black Right lower leg 4cm x2cm blackened/scabbed over  None of the wounds are draining any fluid.      COVID TEST- 12/13/19   Anesthesia review:   Patient denies shortness of breath, fever, cough and chest pain at PAT appointment   All instructions explained to the patient, with a verbal understanding of the material. Patient agrees to go over the instructions while at home for a better understanding. Patient also instructed to self quarantine after being tested for COVID-19. The opportunity to ask questions was provided.

## 2019-12-14 NOTE — Anesthesia Preprocedure Evaluation (Addendum)
Anesthesia Evaluation  Patient identified by MRN, date of birth, ID band Patient awake    Reviewed: Allergy & Precautions, H&P , NPO status , Patient's Chart, lab work & pertinent test results  Airway Mallampati: II   Neck ROM: full    Dental   Pulmonary former smoker,    breath sounds clear to auscultation       Cardiovascular hypertension, + Peripheral Vascular Disease and +CHF  + dysrhythmias Atrial Fibrillation  Rhythm:regular Rate:Normal     Neuro/Psych    GI/Hepatic   Endo/Other  diabetesHypothyroidism Hyperthyroidism   Renal/GU Renal InsufficiencyRenal disease     Musculoskeletal   Abdominal   Peds  Hematology   Anesthesia Other Findings   Reproductive/Obstetrics                             Anesthesia Physical Anesthesia Plan  ASA: III  Anesthesia Plan: General   Post-op Pain Management:    Induction: Intravenous  PONV Risk Score and Plan: 3 and Ondansetron, Dexamethasone and Treatment may vary due to age or medical condition  Airway Management Planned: Oral ETT  Additional Equipment: Arterial line  Intra-op Plan:   Post-operative Plan: Extubation in OR  Informed Consent: I have reviewed the patients History and Physical, chart, labs and discussed the procedure including the risks, benefits and alternatives for the proposed anesthesia with the patient or authorized representative who has indicated his/her understanding and acceptance.       Plan Discussed with: CRNA, Anesthesiologist and Surgeon  Anesthesia Plan Comments: (PAT note written 12/14/2019 by Myra Gianotti, PA-C. )       Anesthesia Quick Evaluation

## 2019-12-14 NOTE — Progress Notes (Signed)
Anesthesia Chart Review:  Case: [id number] Date/Time: 12/15/19 1053   Procedure: BYPASS GRAFT FEMORAL-POPLITEAL ARTERY (Right )   Anesthesia type: General   Pre-op diagnosis: CRITICAL LOWER LIMB ISCHEMIA   Location: [facility] OR ROOM 16 / [place]   Surgeons: [name], MD      DISCUSSION: Patient is a 79 year old female scheduled for the above procedure. She has PAD with nonhealing wounds of the RLE and critical limb ischemia. Per 12/06/19 note by Dr. Carlis Abbott, patient high risk for limb loss.   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  Patient last evaluated by her cardiologist Dr. Claiborne Billings in 08/2019. No new cardiac testing ordered, and one year follow-up advised.  No chest pain, SOB, cough, fever per PAT RN interview. EF 45-50% in 01/2018.  PAT RN spoke with Shanon at [facility] regarding [facility]ix instructions.  [facility] staff to notify patient of preoperative instructions (appears Plavix was started after right EIA stent).  Recent A1c requested from PCP Cyndi Bender, PA-C but has not yet been received. Patient reported fasting CBGs ~ 150. 12/13/19 COVID-19 test was negative. Anesthesia team to evaluate on the day of surgery.    VS: BP (!) 145/70   Pulse 93   Temp 36.8 C (Oral)   Resp 18   Ht 5\' 8"  (1.727 m)   Wt 86.2 kg   SpO2 99%   BMI 28.89 kg/m    PROVIDERS: Cyndi Bender, PA-C is PCP [facility], [place], [place]) - Shelva Majestic, MD is cardiologist, whom she has been seeing since 07/03/13 (previously saw Dr. Doreatha Lew ~ 2008-2012 and Dr. Stanford Breed in 2013). Last visit 08/16/19. Digoxin dosing changed to improve afib rate control (was in the 90's), but otherwise no  chest pain and no new testing ordered. One year follow-up recommended. Zola Button, MD is HEM-ONC. Last evaluation 04/28/19.    LABS: Labs reviewed: Acceptable for surgery. Cr 1.60, range of ~ 1.40-2.02 since at least 2018. WBC 14.6, history of CML, WBC range ~ 10.11.6 since 2018. (all labs ordered are listed, but only abnormal results are displayed)  Labs Reviewed  CBC - Abnormal; Notable for the following components:      Result Value   WBC 14.6 (*)    RDW 16.1 (*)    Platelets 514 (*)    All other components within normal limits  COMPREHENSIVE METABOLIC PANEL - Abnormal; Notable for the following components:   Creatinine, Ser 1.60 (*)    Total Bilirubin 1.4 (*)    GFR calc non Af Amer 31 (*)    GFR calc Af Amer 35 (*)    All other components within normal limits  URINALYSIS, ROUTINE W REFLEX MICROSCOPIC - Abnormal; Notable for the following components:   Color, Urine STRAW (*)    Leukocytes,Ua TRACE (*)    All other components within normal limits  SURGICAL PCR SCREEN  GLUCOSE, CAPILLARY  APTT  PROTIME-INR  TYPE AND SCREEN    EKG: 08/16/19 ([facility]):  Atrial fibrillation with premature ventricular or aberrantly conducted complexes Voltage criteria for left ventricular hypertrophy Abnormal ECG Ventricular rate 93 bpm   CV: Echo 02/04/18: Study Conclusions - Left ventricle: The cavity size was moderately dilated. Wall   thickness was normal. Systolic function was mildly reduced.  The   estimated ejection fraction was in the range of 45% to 50%. There   is severe hypokinesis of the septal myocardium. - Mitral valve: There was mild regurgitation. - Left atrium: The atrium was moderately dilated. - Right atrium: The atrium was moderately dilated. - Pulmonary arteries: PA peak pressure: 35 mm Hg (S). Impressions: - Severe septal hyokinesis with overall mild LV dysfunction;   moderate LVE; mild MR; moderate biatrial enlargement; mild TR. (Comparison: 05/30/15 LVEF  35-40% with diffuse hypokinesis, akinesis of the basal-midinferoseptal myocardium and basal midanteroseptal myocardium)   Nuclear stress test 07/05/13: Overall Impression:  Low risk stress nuclear study with a small amount of septal, basal anteroseptal artifact. No significant reversible ischemia. LV Wall Motion: Non-gated study.   Past Medical History:  Diagnosis Date  . Atrial fibrillation ([place])   . Chronic atrial fibrillation ([place])    a. Refuses [place].  CHA2DS2VASc = 4.  . Chronic systolic CHF (congestive heart failure) ([place])    a. 05/2015 Echo: EF 35-40%.  . CKD (chronic kidney disease), stage III   . CML (chronic myelocytic leukemia) ([place]) 01/10/2013  . DM (diabetes mellitus) ([place])   . Dysrhythmia   . Elevated WBC count   . Hypertension   . Hypertensive heart disease   . Hypothyroidism   . NICM (nonischemic cardiomyopathy) ([place])    a. 06/2013 MV: low risk study w/o ischemia;  b. 05/2015 Echo: EF 35-40%, diff HK, basal-midinferoseptal and basal-midanteroseptal AK. Mild-mod MR, mod dil LA, mild to mod TR, PASP 80mmHg.  Marland Kitchen Pneumonia    30 yrs. ago  . Toxic goiter     Past Surgical History:  Procedure Laterality Date  . ABDOMINAL AORTOGRAM W/LOWER EXTREMITY Bilateral 12/06/2019   Procedure: ABDOMINAL AORTOGRAM W/LOWER EXTREMITY;  Surgeon: [name], MD;  Location: [place] [facility];  Service: Cardiovascular;  Laterality: Bilateral;  . ABDOMINAL HYSTERECTOMY    . CARDIOVERSION    . CYST EXCISION     removed on right ovary  . CYSTECTOMY    . HYSTEROTOMY    . PERIPHERAL VASCULAR INTERVENTION Right 12/06/2019   Procedure: PERIPHERAL VASCULAR INTERVENTION;  Surgeon: [name], MD;  Location: [place] [facility];  Service: Cardiovascular;  Laterality: Right;  EXT ILIAC    MEDICATIONS: . clopidogrel (PLAVIX) 75 MG tablet  . clorazepate (TRANXENE-T) 7.5 MG tablet  . digoxin (LANOXIN) 0.125 MG tablet  . furosemide (LASIX) 20 MG tablet  . glipiZIDE (GLUCOTROL XL) 10  MG 24 hr tablet  . metoprolol (TOPROL-XL) 200 MG 24 hr tablet  . neomycin-polymyxin-hydrocortisone (CORTISPORIN) OTIC solution  . nilotinib (TASIGNA) 150 MG capsule  . silver sulfADIAZINE (SILVADENE) 1 % cream  . sitaGLIPtin (JANUVIA) 50 MG tablet  . spironolactone (ALDACTONE) 25 MG tablet  . SYNTHROID 137 MCG tablet  . TRULICITY 8.41 LK/4.4WN SOPN   No current facility-administered medications for this encounter.    Myra Gianotti, PA-C Surgical Short Stay/Anesthesiology [facility] Phone [phone] [facility] Phone [phone] 12/14/2019 11:48 AM

## 2019-12-15 ENCOUNTER — Encounter (HOSPITAL_COMMUNITY)
Admission: RE | Disposition: A | Discharge status: Home / Self Care | Payer: Self-pay | Source: Home / Self Care | Attending: Vascular Surgery

## 2019-12-15 ENCOUNTER — Inpatient Hospital Stay (HOSPITAL_COMMUNITY): Payer: PPO | Admitting: Vascular Surgery

## 2019-12-15 ENCOUNTER — Encounter (HOSPITAL_COMMUNITY): Payer: Self-pay | Admitting: Vascular Surgery

## 2019-12-15 ENCOUNTER — Inpatient Hospital Stay (HOSPITAL_COMMUNITY)
Admission: RE | Admit: 2019-12-15 | Discharge: 2019-12-17 | DRG: 253 | Disposition: A | Discharge status: Home Health | Payer: PPO | Attending: Vascular Surgery | Admitting: Vascular Surgery

## 2019-12-15 ENCOUNTER — Other Ambulatory Visit: Payer: Self-pay

## 2019-12-15 ENCOUNTER — Inpatient Hospital Stay (HOSPITAL_COMMUNITY): Payer: PPO | Admitting: Certified Registered Nurse Anesthetist

## 2019-12-15 DIAGNOSIS — E1122 Type 2 diabetes mellitus with diabetic chronic kidney disease: Secondary | ICD-10-CM | POA: Diagnosis present

## 2019-12-15 DIAGNOSIS — I998 Other disorder of circulatory system: Secondary | ICD-10-CM | POA: Diagnosis present

## 2019-12-15 DIAGNOSIS — Z79899 Other long term (current) drug therapy: Secondary | ICD-10-CM | POA: Diagnosis not present

## 2019-12-15 DIAGNOSIS — Z79890 Hormone replacement therapy (postmenopausal): Secondary | ICD-10-CM | POA: Diagnosis not present

## 2019-12-15 DIAGNOSIS — I482 Chronic atrial fibrillation, unspecified: Secondary | ICD-10-CM | POA: Diagnosis present

## 2019-12-15 DIAGNOSIS — Z7984 Long term (current) use of oral hypoglycemic drugs: Secondary | ICD-10-CM

## 2019-12-15 DIAGNOSIS — Z833 Family history of diabetes mellitus: Secondary | ICD-10-CM | POA: Diagnosis not present

## 2019-12-15 DIAGNOSIS — C9110 Chronic lymphocytic leukemia of B-cell type not having achieved remission: Secondary | ICD-10-CM | POA: Diagnosis present

## 2019-12-15 DIAGNOSIS — I130 Hypertensive heart and chronic kidney disease with heart failure and stage 1 through stage 4 chronic kidney disease, or unspecified chronic kidney disease: Secondary | ICD-10-CM | POA: Diagnosis present

## 2019-12-15 DIAGNOSIS — I739 Peripheral vascular disease, unspecified: Secondary | ICD-10-CM | POA: Diagnosis present

## 2019-12-15 DIAGNOSIS — Z9221 Personal history of antineoplastic chemotherapy: Secondary | ICD-10-CM | POA: Diagnosis not present

## 2019-12-15 DIAGNOSIS — E1151 Type 2 diabetes mellitus with diabetic peripheral angiopathy without gangrene: Secondary | ICD-10-CM | POA: Diagnosis present

## 2019-12-15 DIAGNOSIS — Z20822 Contact with and (suspected) exposure to covid-19: Secondary | ICD-10-CM | POA: Diagnosis present

## 2019-12-15 DIAGNOSIS — I5022 Chronic systolic (congestive) heart failure: Secondary | ICD-10-CM | POA: Diagnosis present

## 2019-12-15 DIAGNOSIS — N183 Chronic kidney disease, stage 3 unspecified: Secondary | ICD-10-CM | POA: Diagnosis present

## 2019-12-15 DIAGNOSIS — I428 Other cardiomyopathies: Secondary | ICD-10-CM | POA: Diagnosis present

## 2019-12-15 DIAGNOSIS — E039 Hypothyroidism, unspecified: Secondary | ICD-10-CM | POA: Diagnosis present

## 2019-12-15 DIAGNOSIS — I70221 Atherosclerosis of native arteries of extremities with rest pain, right leg: Secondary | ICD-10-CM | POA: Diagnosis present

## 2019-12-15 DIAGNOSIS — Z87891 Personal history of nicotine dependence: Secondary | ICD-10-CM

## 2019-12-15 DIAGNOSIS — Z95828 Presence of other vascular implants and grafts: Secondary | ICD-10-CM

## 2019-12-15 HISTORY — PX: FEMORAL ARTERY - POPLITEAL ARTERY BYPASS GRAFT: SUR180

## 2019-12-15 HISTORY — DX: Peripheral vascular disease, unspecified: I73.9

## 2019-12-15 HISTORY — PX: FEMORAL-POPLITEAL BYPASS GRAFT: SHX937

## 2019-12-15 LAB — HEMOGLOBIN A1C
Hgb A1c MFr Bld: 6.8 % — ABNORMAL HIGH (ref 4.8–5.6)
Mean Plasma Glucose: 148.46 mg/dL

## 2019-12-15 LAB — CBC
HCT: 31 % — ABNORMAL LOW (ref 36.0–46.0)
Hemoglobin: 9.8 g/dL — ABNORMAL LOW (ref 12.0–15.0)
MCH: 28.1 pg (ref 26.0–34.0)
MCHC: 31.6 g/dL (ref 30.0–36.0)
MCV: 88.8 fL (ref 80.0–100.0)
Platelets: 537 10*3/uL — ABNORMAL HIGH (ref 150–400)
RBC: 3.49 MIL/uL — ABNORMAL LOW (ref 3.87–5.11)
RDW: 15.9 % — ABNORMAL HIGH (ref 11.5–15.5)
WBC: 27.3 10*3/uL — ABNORMAL HIGH (ref 4.0–10.5)
nRBC: 0 % (ref 0.0–0.2)

## 2019-12-15 LAB — CREATININE, SERUM
Creatinine, Ser: 1.58 mg/dL — ABNORMAL HIGH (ref 0.44–1.00)
GFR calc Af Amer: 36 mL/min — ABNORMAL LOW (ref >60)
GFR calc non Af Amer: 31 mL/min — ABNORMAL LOW (ref >60)

## 2019-12-15 LAB — GLUCOSE, CAPILLARY
Glucose-Capillary: 142 mg/dL — ABNORMAL HIGH (ref 70–99)
Glucose-Capillary: 198 mg/dL — ABNORMAL HIGH (ref 70–99)
Glucose-Capillary: 258 mg/dL — ABNORMAL HIGH (ref 70–99)
Glucose-Capillary: 235 mg/dL — ABNORMAL HIGH (ref 70–99)

## 2019-12-15 SURGERY — BYPASS GRAFT FEMORAL-POPLITEAL ARTERY
Anesthesia: General | Site: Leg Upper | Laterality: Right

## 2019-12-15 MED ORDER — LIDOCAINE 2% (20 MG/ML) 5 ML SYRINGE
INTRAMUSCULAR | Status: DC | PRN
  Administered 2019-12-15: 50 mg via INTRAVENOUS

## 2019-12-15 MED ORDER — LACTATED RINGERS IV SOLN: INTRAVENOUS | Status: DC | PRN

## 2019-12-15 MED ORDER — SODIUM CHLORIDE 0.9 % IV SOLN
INTRAVENOUS | Status: AC
  Filled 2019-12-15: qty 1.2

## 2019-12-15 MED ORDER — PHENOL 1.4 % MT LIQD: 1.0000 | OROMUCOSAL | Status: DC | PRN

## 2019-12-15 MED ORDER — DIGOXIN 125 MCG PO TABS
0.0625 mg | ORAL_TABLET | ORAL | Status: DC
  Administered 2019-12-17: 0.0625 mg via ORAL
  Filled 2019-12-15: qty 1

## 2019-12-15 MED ORDER — PROTAMINE SULFATE 10 MG/ML IV SOLN
INTRAVENOUS | Status: DC | PRN
  Administered 2019-12-15: 10 mg via INTRAVENOUS
  Administered 2019-12-15: 40 mg via INTRAVENOUS

## 2019-12-15 MED ORDER — DULAGLUTIDE 0.75 MG/0.5ML ~~LOC~~ SOAJ: 0.7500 mg | SUBCUTANEOUS | Status: DC

## 2019-12-15 MED ORDER — POTASSIUM CHLORIDE CRYS ER 20 MEQ PO TBCR: 20.0000 meq | EXTENDED_RELEASE_TABLET | Freq: Every day | ORAL | Status: DC | PRN

## 2019-12-15 MED ORDER — FENTANYL CITRATE (PF) 100 MCG/2ML IJ SOLN
INTRAMUSCULAR | Status: DC | PRN
  Administered 2019-12-15 (×4): 50 ug via INTRAVENOUS
  Administered 2019-12-15: 100 ug via INTRAVENOUS

## 2019-12-15 MED ORDER — ROCURONIUM BROMIDE 50 MG/5ML IV SOSY
PREFILLED_SYRINGE | INTRAVENOUS | Status: DC | PRN
  Administered 2019-12-15: 50 mg via INTRAVENOUS
  Administered 2019-12-15: 10 mg via INTRAVENOUS
  Administered 2019-12-15: 20 mg via INTRAVENOUS

## 2019-12-15 MED ORDER — SODIUM CHLORIDE 0.9 % IV SOLN
INTRAVENOUS | Status: DC | PRN
  Administered 2019-12-15: 500 mL

## 2019-12-15 MED ORDER — CLOPIDOGREL BISULFATE 75 MG PO TABS
75.0000 mg | ORAL_TABLET | Freq: Every day | ORAL | Status: DC
  Administered 2019-12-16 – 2019-12-17 (×2): 75 mg via ORAL
  Filled 2019-12-15 (×2): qty 1

## 2019-12-15 MED ORDER — DEXAMETHASONE SODIUM PHOSPHATE 10 MG/ML IJ SOLN
INTRAMUSCULAR | Status: DC | PRN
  Administered 2019-12-15: 4 mg via INTRAVENOUS

## 2019-12-15 MED ORDER — FUROSEMIDE 20 MG PO TABS
20.0000 mg | ORAL_TABLET | Freq: Two times a day (BID) | ORAL | Status: DC
  Administered 2019-12-15 – 2019-12-17 (×4): 20 mg via ORAL
  Filled 2019-12-15 (×4): qty 1

## 2019-12-15 MED ORDER — VASOPRESSIN 20 UNIT/ML IV SOLN
INTRAVENOUS | Status: AC
  Filled 2019-12-15: qty 1

## 2019-12-15 MED ORDER — GUAIFENESIN-DM 100-10 MG/5ML PO SYRP: 15.0000 mL | ORAL_SOLUTION | ORAL | Status: DC | PRN

## 2019-12-15 MED ORDER — MORPHINE SULFATE (PF) 2 MG/ML IV SOLN: 2.0000 mg | INTRAVENOUS | Status: DC | PRN

## 2019-12-15 MED ORDER — PHENYLEPHRINE HCL-NACL 10-0.9 MG/250ML-% IV SOLN
INTRAVENOUS | Status: DC | PRN
  Administered 2019-12-15: 20 ug/min via INTRAVENOUS

## 2019-12-15 MED ORDER — CLORAZEPATE DIPOTASSIUM 3.75 MG PO TABS: 7.5000 mg | ORAL_TABLET | Freq: Two times a day (BID) | ORAL | Status: DC | PRN

## 2019-12-15 MED ORDER — CEFAZOLIN SODIUM-DEXTROSE 2-4 GM/100ML-% IV SOLN
2.0000 g | INTRAVENOUS | Status: AC
  Administered 2019-12-15: 10:00:00 2 g via INTRAVENOUS

## 2019-12-15 MED ORDER — DOCUSATE SODIUM 100 MG PO CAPS
100.0000 mg | ORAL_CAPSULE | Freq: Every day | ORAL | Status: DC
  Administered 2019-12-16 – 2019-12-17 (×2): 100 mg via ORAL
  Filled 2019-12-15 (×2): qty 1

## 2019-12-15 MED ORDER — ONDANSETRON HCL 4 MG/2ML IJ SOLN
INTRAMUSCULAR | Status: DC | PRN
  Administered 2019-12-15: 4 mg via INTRAVENOUS

## 2019-12-15 MED ORDER — SUGAMMADEX SODIUM 200 MG/2ML IV SOLN
INTRAVENOUS | Status: DC | PRN
  Administered 2019-12-15: 200 mg via INTRAVENOUS

## 2019-12-15 MED ORDER — MAGNESIUM SULFATE 2 GM/50ML IV SOLN: 2.0000 g | Freq: Every day | INTRAVENOUS | Status: DC | PRN

## 2019-12-15 MED ORDER — SODIUM CHLORIDE 0.9 % IV SOLN: INTRAVENOUS | Status: DC

## 2019-12-15 MED ORDER — FENTANYL CITRATE (PF) 250 MCG/5ML IJ SOLN
INTRAMUSCULAR | Status: AC
  Filled 2019-12-15: qty 5

## 2019-12-15 MED ORDER — NILOTINIB HCL 150 MG PO CAPS
300.0000 mg | ORAL_CAPSULE | Freq: Two times a day (BID) | ORAL | Status: DC
  Administered 2019-12-16 – 2019-12-17 (×2): 300 mg via ORAL
  Filled 2019-12-15 (×3): qty 2

## 2019-12-15 MED ORDER — CHLORHEXIDINE GLUCONATE CLOTH 2 % EX PADS: 6.0000 | MEDICATED_PAD | Freq: Once | CUTANEOUS | Status: DC

## 2019-12-15 MED ORDER — LABETALOL HCL 5 MG/ML IV SOLN: 10.0000 mg | INTRAVENOUS | Status: DC | PRN

## 2019-12-15 MED ORDER — METOPROLOL SUCCINATE ER 100 MG PO TB24
200.0000 mg | ORAL_TABLET | Freq: Every day | ORAL | Status: DC
  Administered 2019-12-16 – 2019-12-17 (×2): 200 mg via ORAL
  Filled 2019-12-15 (×2): qty 2

## 2019-12-15 MED ORDER — OXYCODONE HCL 5 MG PO TABS: 5.0000 mg | ORAL_TABLET | Freq: Once | ORAL | Status: DC | PRN

## 2019-12-15 MED ORDER — HEPARIN SODIUM (PORCINE) 5000 UNIT/ML IJ SOLN
5000.0000 [IU] | Freq: Three times a day (TID) | INTRAMUSCULAR | Status: DC
  Administered 2019-12-16 – 2019-12-17 (×3): 5000 [IU] via SUBCUTANEOUS
  Filled 2019-12-15 (×3): qty 1

## 2019-12-15 MED ORDER — LINAGLIPTIN 5 MG PO TABS
5.0000 mg | ORAL_TABLET | Freq: Every day | ORAL | Status: DC
  Administered 2019-12-16 – 2019-12-17 (×2): 5 mg via ORAL
  Filled 2019-12-15 (×2): qty 1

## 2019-12-15 MED ORDER — GLIPIZIDE ER 10 MG PO TB24
10.0000 mg | ORAL_TABLET | Freq: Two times a day (BID) | ORAL | Status: DC
  Administered 2019-12-16 – 2019-12-17 (×3): 10 mg via ORAL
  Filled 2019-12-15 (×4): qty 1

## 2019-12-15 MED ORDER — ONDANSETRON HCL 4 MG/2ML IJ SOLN: 4.0000 mg | Freq: Four times a day (QID) | INTRAMUSCULAR | Status: DC | PRN

## 2019-12-15 MED ORDER — OXYCODONE-ACETAMINOPHEN 5-325 MG PO TABS: 1.0000 | ORAL_TABLET | ORAL | Status: DC | PRN

## 2019-12-15 MED ORDER — ATORVASTATIN CALCIUM 10 MG PO TABS
20.0000 mg | ORAL_TABLET | Freq: Every day | ORAL | Status: DC
  Administered 2019-12-16: 17:00:00 20 mg via ORAL
  Filled 2019-12-15 (×2): qty 2

## 2019-12-15 MED ORDER — EPHEDRINE SULFATE 50 MG/ML IJ SOLN
INTRAMUSCULAR | Status: DC | PRN
  Administered 2019-12-15 (×2): 25 mg via INTRAVENOUS

## 2019-12-15 MED ORDER — PROPOFOL 10 MG/ML IV BOLUS
INTRAVENOUS | Status: DC | PRN
  Administered 2019-12-15: 50 mg via INTRAVENOUS
  Administered 2019-12-15: 100 mg via INTRAVENOUS

## 2019-12-15 MED ORDER — CEFAZOLIN SODIUM-DEXTROSE 2-4 GM/100ML-% IV SOLN
2.0000 g | Freq: Three times a day (TID) | INTRAVENOUS | Status: AC
  Administered 2019-12-15 – 2019-12-16 (×2): 2 g via INTRAVENOUS
  Filled 2019-12-15 (×2): qty 100

## 2019-12-15 MED ORDER — CEFAZOLIN SODIUM-DEXTROSE 2-4 GM/100ML-% IV SOLN
INTRAVENOUS | Status: AC
  Filled 2019-12-15: qty 100

## 2019-12-15 MED ORDER — POLYETHYLENE GLYCOL 3350 17 G PO PACK: 17.0000 g | PACK | Freq: Every day | ORAL | Status: DC | PRN

## 2019-12-15 MED ORDER — PANTOPRAZOLE SODIUM 40 MG PO TBEC
40.0000 mg | DELAYED_RELEASE_TABLET | Freq: Every day | ORAL | Status: DC
  Administered 2019-12-16: 09:00:00 40 mg via ORAL
  Filled 2019-12-15: qty 1

## 2019-12-15 MED ORDER — LEVOTHYROXINE SODIUM 25 MCG PO TABS
137.0000 ug | ORAL_TABLET | Freq: Every day | ORAL | Status: DC
  Administered 2019-12-16 – 2019-12-17 (×2): 137 ug via ORAL
  Filled 2019-12-15 (×2): qty 1

## 2019-12-15 MED ORDER — SODIUM CHLORIDE 0.9 % IV SOLN: 500.0000 mL | Freq: Once | INTRAVENOUS | Status: DC | PRN

## 2019-12-15 MED ORDER — ACETAMINOPHEN 325 MG PO TABS
325.0000 mg | ORAL_TABLET | ORAL | Status: DC | PRN
  Administered 2019-12-15 – 2019-12-17 (×2): 650 mg via ORAL
  Filled 2019-12-15 (×2): qty 2

## 2019-12-15 MED ORDER — HEMOSTATIC AGENTS (NO CHARGE) OPTIME
TOPICAL | Status: DC | PRN
  Administered 2019-12-15: 1 via TOPICAL

## 2019-12-15 MED ORDER — 0.9 % SODIUM CHLORIDE (POUR BTL) OPTIME
TOPICAL | Status: DC | PRN
  Administered 2019-12-15: 1000 mL

## 2019-12-15 MED ORDER — BISACODYL 10 MG RE SUPP: 10.0000 mg | Freq: Every day | RECTAL | Status: DC | PRN

## 2019-12-15 MED ORDER — PHENYLEPHRINE HCL (PRESSORS) 10 MG/ML IV SOLN
INTRAVENOUS | Status: DC | PRN
  Administered 2019-12-15: 80 ug via INTRAVENOUS
  Administered 2019-12-15: 200 ug via INTRAVENOUS

## 2019-12-15 MED ORDER — FENTANYL CITRATE (PF) 100 MCG/2ML IJ SOLN
25.0000 ug | INTRAMUSCULAR | Status: DC | PRN
  Administered 2019-12-15: 15:00:00 25 ug via INTRAVENOUS

## 2019-12-15 MED ORDER — ACETAMINOPHEN 325 MG RE SUPP: 325.0000 mg | RECTAL | Status: DC | PRN

## 2019-12-15 MED ORDER — HEPARIN SODIUM (PORCINE) 1000 UNIT/ML IJ SOLN
INTRAMUSCULAR | Status: DC | PRN
  Administered 2019-12-15: 9000 [IU] via INTRAVENOUS
  Administered 2019-12-15 (×2): 2000 [IU] via INTRAVENOUS

## 2019-12-15 MED ORDER — SPIRONOLACTONE 12.5 MG HALF TABLET
12.5000 mg | ORAL_TABLET | Freq: Every evening | ORAL | Status: DC
  Administered 2019-12-16: 17:00:00 12.5 mg via ORAL
  Filled 2019-12-15 (×3): qty 1

## 2019-12-15 MED ORDER — METOPROLOL TARTRATE 5 MG/5ML IV SOLN: 2.0000 mg | INTRAVENOUS | Status: DC | PRN

## 2019-12-15 MED ORDER — DEXAMETHASONE SODIUM PHOSPHATE 10 MG/ML IJ SOLN
INTRAMUSCULAR | Status: AC
  Filled 2019-12-15: qty 1

## 2019-12-15 MED ORDER — INSULIN ASPART 100 UNIT/ML ~~LOC~~ SOLN
0.0000 [IU] | Freq: Three times a day (TID) | SUBCUTANEOUS | Status: DC
  Administered 2019-12-15: 18:00:00 8 [IU] via SUBCUTANEOUS
  Administered 2019-12-16: 3 [IU] via SUBCUTANEOUS
  Administered 2019-12-16: 13:00:00 5 [IU] via SUBCUTANEOUS
  Administered 2019-12-16: 08:00:00 3 [IU] via SUBCUTANEOUS

## 2019-12-15 MED ORDER — FENTANYL CITRATE (PF) 100 MCG/2ML IJ SOLN
INTRAMUSCULAR | Status: AC
  Filled 2019-12-15: qty 2

## 2019-12-15 MED ORDER — OXYCODONE HCL 5 MG/5ML PO SOLN: 5.0000 mg | Freq: Once | ORAL | Status: DC | PRN

## 2019-12-15 MED ORDER — ARTIFICIAL TEARS OPHTHALMIC OINT
TOPICAL_OINTMENT | OPHTHALMIC | Status: AC
  Filled 2019-12-15: qty 3.5

## 2019-12-15 MED ORDER — HYDRALAZINE HCL 20 MG/ML IJ SOLN: 5.0000 mg | INTRAMUSCULAR | Status: DC | PRN

## 2019-12-15 SURGICAL SUPPLY — 68 items
BANDAGE ESMARK 6X9 LF (GAUZE/BANDAGES/DRESSINGS) IMPLANT
BNDG ELASTIC 6X5.8 VLCR STR LF (GAUZE/BANDAGES/DRESSINGS) ×2 IMPLANT
BNDG ESMARK 6X9 LF (GAUZE/BANDAGES/DRESSINGS)
CANISTER SUCT 3000ML PPV (MISCELLANEOUS) ×3 IMPLANT
CANNULA VESSEL 3MM 2 BLNT TIP (CANNULA) ×2 IMPLANT
CLIP FOGARTY SPRING 6M (CLIP) ×1 IMPLANT
CLIP VESOCCLUDE MED 24/CT (CLIP) ×3 IMPLANT
CLIP VESOCCLUDE SM WIDE 24/CT (CLIP) ×3 IMPLANT
COVER PROBE W GEL 5X96 (DRAPES) ×3 IMPLANT
COVER WAND RF STERILE (DRAPES) ×1 IMPLANT
CUFF TOURN SGL QUICK 24 (TOURNIQUET CUFF)
CUFF TOURN SGL QUICK 34 (TOURNIQUET CUFF)
CUFF TOURN SGL QUICK 42 (TOURNIQUET CUFF) IMPLANT
CUFF TRNQT CYL 24X4X16.5-23 (TOURNIQUET CUFF) IMPLANT
CUFF TRNQT CYL 34X4.125X (TOURNIQUET CUFF) IMPLANT
DERMABOND ADVANCED (GAUZE/BANDAGES/DRESSINGS) ×2
DERMABOND ADVANCED .7 DNX12 (GAUZE/BANDAGES/DRESSINGS) ×1 IMPLANT
DRAIN CHANNEL 15F RND FF W/TCR (WOUND CARE) IMPLANT
DRAPE C-ARM 42X72 X-RAY (DRAPES) ×1 IMPLANT
DRAPE HALF SHEET 40X57 (DRAPES) IMPLANT
ELECT REM PT RETURN 9FT ADLT (ELECTROSURGICAL) ×3
ELECTRODE REM PT RTRN 9FT ADLT (ELECTROSURGICAL) ×1 IMPLANT
EVACUATOR SILICONE 100CC (DRAIN) IMPLANT
GLOVE BIO SURGEON STRL SZ7.5 (GLOVE) ×3 IMPLANT
GLOVE BIOGEL PI IND STRL 6.5 (GLOVE) IMPLANT
GLOVE BIOGEL PI IND STRL 8 (GLOVE) ×1 IMPLANT
GLOVE BIOGEL PI INDICATOR 6.5 (GLOVE) ×8
GLOVE BIOGEL PI INDICATOR 8 (GLOVE) ×2
GLOVE SURG SS PI 6.5 STRL IVOR (GLOVE) ×4 IMPLANT
GOWN STRL REUS W/ TWL LRG LVL3 (GOWN DISPOSABLE) ×2 IMPLANT
GOWN STRL REUS W/ TWL XL LVL3 (GOWN DISPOSABLE) ×2 IMPLANT
GOWN STRL REUS W/TWL LRG LVL3 (GOWN DISPOSABLE) ×4
GOWN STRL REUS W/TWL XL LVL3 (GOWN DISPOSABLE) ×4
HEMOSTAT SNOW SURGICEL 2X4 (HEMOSTASIS) ×2 IMPLANT
HEMOSTAT SPONGE AVITENE ULTRA (HEMOSTASIS) IMPLANT
INSERT FOGARTY SM (MISCELLANEOUS) IMPLANT
KIT BASIN OR (CUSTOM PROCEDURE TRAY) ×3 IMPLANT
KIT TURNOVER KIT B (KITS) ×3 IMPLANT
MARKER SKIN DUAL TIP RULER LAB (MISCELLANEOUS) ×2 IMPLANT
NS IRRIG 1000ML POUR BTL (IV SOLUTION) ×6 IMPLANT
PACK PERIPHERAL VASCULAR (CUSTOM PROCEDURE TRAY) ×3 IMPLANT
PAD ARMBOARD 7.5X6 YLW CONV (MISCELLANEOUS) ×6 IMPLANT
SET MICROPUNCTURE 5F STIFF (MISCELLANEOUS) IMPLANT
SPONGE INTESTINAL PEANUT (DISPOSABLE) ×2 IMPLANT
SPONGE LAP 18X18 RF (DISPOSABLE) ×2 IMPLANT
STOPCOCK 4 WAY LG BORE MALE ST (IV SETS) IMPLANT
SUT ETHILON 3 0 PS 1 (SUTURE) IMPLANT
SUT GORETEX 5 0 TT13 24 (SUTURE) IMPLANT
SUT GORETEX 6.0 TT13 (SUTURE) IMPLANT
SUT MNCRL AB 4-0 PS2 18 (SUTURE) ×11 IMPLANT
SUT PROLENE 5 0 C 1 24 (SUTURE) ×13 IMPLANT
SUT PROLENE 6 0 BV (SUTURE) ×27 IMPLANT
SUT PROLENE 7 0 BV 1 (SUTURE) IMPLANT
SUT SILK 2 0 PERMA HAND 18 BK (SUTURE) ×6 IMPLANT
SUT SILK 3 0 (SUTURE) ×6
SUT SILK 3-0 18XBRD TIE 12 (SUTURE) IMPLANT
SUT SILK 4 0 (SUTURE) ×2
SUT SILK 4-0 18XBRD TIE 12 (SUTURE) IMPLANT
SUT VIC AB 2-0 CT1 27 (SUTURE) ×10
SUT VIC AB 2-0 CT1 TAPERPNT 27 (SUTURE) ×2 IMPLANT
SUT VIC AB 3-0 SH 27 (SUTURE) ×10
SUT VIC AB 3-0 SH 27X BRD (SUTURE) ×3 IMPLANT
TAPE UMBILICAL COTTON 1/8X30 (MISCELLANEOUS) IMPLANT
TOWEL GREEN STERILE (TOWEL DISPOSABLE) ×3 IMPLANT
TRAY FOLEY MTR SLVR 16FR STAT (SET/KITS/TRAYS/PACK) ×3 IMPLANT
TUBING EXTENTION W/L.L. (IV SETS) IMPLANT
UNDERPAD 30X30 (UNDERPADS AND DIAPERS) ×3 IMPLANT
WATER STERILE IRR 1000ML POUR (IV SOLUTION) ×3 IMPLANT

## 2019-12-15 NOTE — Transfer of Care (Signed)
Immediate Anesthesia Transfer of Care Note  Patient: Sara Warner  Procedure(s) Performed: BYPASS GRAFT FEMORAL-POPLITEAL ARTERY (Right Leg Upper)  Patient Location: PACU  Anesthesia Type:General  Level of Consciousness: awake and alert   Airway & Oxygen Therapy: Patient Spontanous Breathing and Patient connected to nasal cannula oxygen  Post-op Assessment: Report given to RN and Post -op Vital signs reviewed and stable  Post vital signs: Reviewed and stable  Last Vitals:  Vitals Value Taken Time  BP 121/64 12/15/19 1419  Temp    Pulse 104 12/15/19 1428  Resp 23 12/15/19 1428  SpO2 95 % 12/15/19 1428  Vitals shown include unvalidated device data.  Last Pain:  Vitals:   12/15/19 0927  TempSrc:   PainSc: 0-No pain         Complications: No apparent anesthesia complications

## 2019-12-15 NOTE — Anesthesia Procedure Notes (Signed)
Arterial Line Insertion Start/End1/15/2021 9:20 AM, 12/15/2019 9:25 AM Performed by: Inda Coke, CRNA, CRNA  Patient location: Pre-op. Preanesthetic checklist: patient identified, IV checked, site marked, risks and benefits discussed, surgical consent, monitors and equipment checked, pre-op evaluation, timeout performed and anesthesia consent Lidocaine 1% used for infiltration Right, radial was placed Catheter size: 20 G Hand hygiene performed  and maximum sterile barriers used  Allen's test indicative of satisfactory collateral circulation Attempts: 1 Procedure performed without using ultrasound guided technique. Ultrasound Notes:anatomy identified Following insertion, dressing applied and Biopatch. Post procedure assessment: normal  Patient tolerated the procedure well with no immediate complications.

## 2019-12-15 NOTE — H&P (Signed)
History and Physical Interval Note:  12/15/2019 9:00 AM  Sara Warner  has presented today for surgery, with the diagnosis of CRITICAL LOWER LIMB ISCHEMIA.  The various methods of treatment have been discussed with the patient and family. After consideration of risks, benefits and other options for treatment, the patient has consented to  Procedure(s): BYPASS GRAFT FEMORAL-POPLITEAL ARTERY (Right) as a surgical intervention.  The patient's history has been reviewed, patient examined, no change in status, stable for surgery.  I have reviewed the patient's chart and labs.  Questions were answered to the patient's satisfaction.    Right fem pop bypass  Sara Warner  Patient name: Sara Warner MRN: [id number] DOB: 1941/08/29 Sex: female  REASON FOR CONSULT: Nonhealing wounds of right lower extremity  HPI:  Sara Warner is a 79 y.o. 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 Patient states she got the wound approximately 4 months ago when she tripped on the carpet. Ultimately the wounds have not healed and she now has a wound on her right shin as well as her right heel and right great toe. She underwent noninvasive imaging in [facility] and has a severely depressed ABI of 0.21 in the right lower extremity. Her ABI on the left is 0.53. Patient denies any previous lower extremity interventions. She previously walked with a cane but is now walking with a walker following her injury. She does live at home independently.      Past Medical History:  Diagnosis Date  . Atrial fibrillation ([place])   . Chronic atrial fibrillation ([place])    a. Refuses [place]. CHA2DS2VASc = 4.  . Chronic systolic CHF (congestive heart failure) ([place])    a. 05/2015 Echo: EF 35-40%.  . CKD (chronic kidney disease),  stage III   . CML (chronic myelocytic leukemia) ([place]) 01/10/2013  . DM (diabetes mellitus) ([place])   . Elevated WBC count   . Hypertensive heart disease   . Hypothyroidism   . NICM (nonischemic cardiomyopathy) ([place])    a. 06/2013 MV: low risk study w/o ischemia; b. 05/2015 Echo: EF 35-40%, diff HK, basal-midinferoseptal and basal-midanteroseptal AK. Mild-mod MR, mod dil LA, mild to mod TR, PASP 54mmHg.  Marland Kitchen Toxic goiter         Past Surgical History:  Procedure Laterality Date  . CARDIOVERSION    . CYSTECTOMY    . HYSTEROTOMY          Family History  Problem Relation Age of Onset  . Cancer - Colon Mother    mast to lungs  . Diabetes Father   . Diabetes Paternal Grandmother   SOCIAL HISTORY:  Social History        Socioeconomic History  . Marital status: Single    Spouse name: Not on file  . Number of children: Not on file  . Years of education: Not on file  . Highest education level: Not on file  Occupational History  . Not on file  Tobacco Use  . Smoking status: Former Smoker    Types: Cigarettes    Quit date: 01/01/1984    Years since quitting: 35.9  . Smokeless tobacco: Never Used  Substance and Sexual Activity  . Alcohol use: No  . Drug use: Never  . Sexual activity: Not on file  Other Topics Concern  . Not on file  Social  History Narrative  . Not on file   Social Determinants of Health      Financial Resource Strain:   . Difficulty of Paying Living Expenses: Not on file  Food Insecurity:   . Worried About Charity fundraiser in the Last Year: Not on file  . Ran Out of Food in the Last Year: Not on file  Transportation Needs:   . Lack of Transportation (Medical): Not on file  . Lack of Transportation (Non-Medical): Not on file  Physical Activity:   . Days of Exercise per Week: Not on file  . Minutes of Exercise per Session: Not on file  Stress:   . Feeling of Stress : Not on file  Social Connections:   . Frequency of Communication with Friends and  Family: Not on file  . Frequency of Social Gatherings with Friends and Family: Not on file  . Attends Religious Services: Not on file  . Active Member of Clubs or Organizations: Not on file  . Attends Archivist Meetings: Not on file  . Marital Status: Not on file  Intimate Partner Violence:   . Fear of Current or Ex-Partner: Not on file  . Emotionally Abused: Not on file  . Physically Abused: Not on file  . Sexually Abused: Not on file        Allergies  Allergen Reactions  . Losartan Other (See Comments)    Patient reports nightmares/vivid dreams when taking.  . Lisinopril Cough         Current Outpatient Medications  Medication Sig Dispense Refill  . clorazepate (TRANXENE-T) 7.5 MG tablet Take 1 tablet (7.5 mg total) by mouth 2 (two) times daily as needed for anxiety. 30 tablet 1  . digoxin (LANOXIN) 0.125 MG tablet Take 0.5 tablets (0.0625 mg total) by mouth every other day. 45 tablet 1  . furosemide (LASIX) 20 MG tablet TAKE 2 TABLETS IN THE MORNING AND AN EXTRA TABLET IF GAIN 3 OR MORE POUNDS IN ONE DAY SCHEDULE OV. 180 tablet 0  . glipiZIDE (GLUCOTROL XL) 10 MG 24 hr tablet Take 10 mg by mouth 2 (two) times daily.    . metoprolol (TOPROL-XL) 200 MG 24 hr tablet TAKE 1 TABLET BY MOUTH DAILY. 90 tablet 2  . neomycin-polymyxin-hydrocortisone (CORTISPORIN) OTIC solution Apply 2-3 drops to the ingrown toenail site twice daily. Cover with band-aid. 10 mL 0  . nilotinib (TASIGNA) 150 MG capsule Take 2 capsules (300 mg total) by mouth every 12 (twelve) hours. Take on an empty stomach, 1 hour before or 2 hours after meals. 120 capsule 2  . sitaGLIPtin (JANUVIA) 50 MG tablet Take 50 mg by mouth.     . SYNTHROID 150 MCG tablet Take 150 mcg by mouth daily.  1  . TRULICITY 5.18 AC/1.6SA SOPN INJECT 1 SOLUTION PEN INJECTOR WEEKLY    . metFORMIN (GLUCOPHAGE-XR) 500 MG 24 hr tablet Take 1 tablet by mouth 2 (two) times daily.    . potassium chloride SA (K-DUR,KLOR-CON) 20 MEQ tablet  Take 20 mEq by mouth daily.    . silver sulfADIAZINE (SILVADENE) 1 % cream Apply 1 application topically daily. Use daily to Right ankle (Patient not taking: Reported on 11/28/2019) 20 g 0  . spironolactone (ALDACTONE) 25 MG tablet Take 0.5 tablets (12.5 mg total) by mouth 2 (two) times daily. 90 tablet 3   No current facility-administered medications for this visit.  REVIEW OF SYSTEMS:  [X]  denotes positive finding, [ ]  denotes negative finding  Cardiac  Comments:  Chest pain or chest pressure:    Shortness of breath upon exertion:    Short of breath when lying flat:    Irregular heart rhythm:        Vascular    Pain in calf, thigh, or hip brought on by ambulation:    Pain in feet at night that wakes you up from your sleep:  x right  Blood clot in your veins:    Leg swelling:         Pulmonary    Oxygen at home:    Productive cough:     Wheezing:         Neurologic    Sudden weakness in arms or legs:     Sudden numbness in arms or legs:     Sudden onset of difficulty speaking or slurred speech:    Temporary loss of vision in one eye:     Problems with dizziness:         Gastrointestinal    Blood in stool:     Vomited blood:         Genitourinary    Burning when urinating:     Blood in urine:        Psychiatric    Major depression:         Hematologic    Bleeding problems:    Problems with blood clotting too easily:        Skin    Rashes or ulcers:        Constitutional    Fever or chills:    PHYSICAL EXAM:     Vitals:   11/28/19 1142  BP: (!) 147/68  Pulse: 87  Resp: 16  Temp: (!) 97.1 F (36.2 C)  TempSrc: Temporal  SpO2: 97%  Weight: 181 lb (82.1 kg)  Height: 5\' 8"  (1.727 m)  GENERAL: The patient is a well-nourished female, in no acute distress. The vital signs are documented above.  CARDIAC: There is a regular rate and rhythm.  VASCULAR:  2+ left femoral pulse  No palpable right femoral pulse  No palpable right pedal pulses  Right shin, great  toe, and ankle wound as pictured below  PULMONARY: There is good air exchange bilaterally without wheezing or rales.  ABDOMEN: Soft and non-tender with normal pitched bowel sounds.  MUSCULOSKELETAL: There are no major deformities or cyanosis.  NEUROLOGIC: No focal weakness or paresthesias are detected.  PSYCHIATRIC: The patient has a normal affect.   DATA:  ABIs were done in [place] but were 0.21 on the right and 0.53 on the left  Assessment/Plan:  79 year old female with critical limb ischemia of the right lower extremity with tissue loss. She has nonhealing wounds that have been present for the last 4 months. She has a severely depressed ABI of 0.21 in the right lower extremity. I cannot appreciate a right femoral pulse suggesting likely some component of iliac disease. She does have an easily palpable left femoral pulse. I have recommended aortogram, right lower extremity arteriogram with possible intervention next week. Discussed may ultimately required open surgery and/or bypass. We will schedule as soon as possible. Discussed high risk for limb loss even with maximal intervention.  Sara Heck, MD  [facility]  Office: [phone]  Pager: [phone]

## 2019-12-15 NOTE — Anesthesia Procedure Notes (Signed)
Procedure Name: Intubation Date/Time: 12/15/2019 10:17 AM Performed by: Inda Coke, CRNA Pre-anesthesia Checklist: Patient identified, Emergency Drugs available, Suction available and Patient being monitored Patient Re-evaluated:Patient Re-evaluated prior to induction Oxygen Delivery Method: Circle System Utilized Preoxygenation: Pre-oxygenation with 100% oxygen Induction Type: IV induction Ventilation: Mask ventilation without difficulty Laryngoscope Size: Mac and 3 Grade View: Grade I Tube type: Oral Tube size: 7.5 mm Number of attempts: 1 Airway Equipment and Method: Stylet and Oral airway Placement Confirmation: ETT inserted through vocal cords under direct vision,  positive ETCO2 and breath sounds checked- equal and bilateral Secured at: 22 cm Tube secured with: Tape Dental Injury: Teeth and Oropharynx as per pre-operative assessment

## 2019-12-15 NOTE — Op Note (Signed)
Date: December 15, 2019  Preoperative diagnosis: Critical limb ischemia of the right lower extremity with tissue loss  Postoperative diagnosis: Same  Procedure: 1.  Harvest of right great saphenous vein 2.  Right common femoral artery to below-knee popliteal artery bypass with ipsilateral nonreversed great saphenous vein  Surgeon: Dr. [name], MD  Assistant: Leontine Locket, PA  Indications: Patient is a 79 year old female who recently presented to clinic with multiple wounds on her right lower extremity with severely depressed ABI of 0.2.  She was found to have no right femoral pulse and last week underwent recanalization of her right iliac artery with multiple stents.  She presents today for planned common femoral to below-knee popliteal artery bypass with flush SFA occlusion after risks and benefits were discussed.  Findings: The right great saphenous vein was harvested from the groin to below the knee with skip incisions.  This was sewn in nonreversed fashion to the common femoral artery and tunneled subfascial to the below-knee popliteal artery.  Patient had brisk dorsalis pedis and posterior tibial signals at completion of the case.  Anesthesia: General  Details: Patient was taken to the operating room after informed consent was obtained.  She was placed on operative table in supine position.  General endotracheal anesthesia was induced.  Subsequently used ultrasound probe marked the saphenous vein on her right thigh and calf.  Ultimately her right leg was then prepped and draped in usual sterile fashion including the right groin.  A preoperative timeout was performed identify patient, procedure and site.  Initially performed a right groin incision horizontal to the inguinal ligament just above the groin crease.  Dissected down with Bovie cautery using cerebellar retractors and got circumferential control the common femoral artery and ultimately the recent percutaneous access in  the right common femoral artery was actively bleeding after we opened the femoral sheath and this was repaired with 5-0 Prolene's.  Ultimately once we got circumferential control we then started harvesting the vein which was then harvested with multiple skip incisions down the right thigh and calf.  Through these incisions all side branches were ligated between 3-0 silk ties and divided.  The vein was then circumferentially mobilized from the groin crease all the way down to the calf.  That point in time the vein was transected with a right angle clamp distally and oversewn with a 2-0 silk.  I then passed the vein through all the skin tunnels and then a right angle clamp was placed at the saphenofemoral junction and this was oversewn with a 5-0 Prolene.  The vein was then reversed and flushed and dilated very nicely and several side branches were repaired with 6-0 Prolene's.   I then went down and used the vein harvest incision below the knee on the medial calf and the below-knee popliteal artery was exposed.  I dissected down to the popliteal space and the popliteal veins were mobilized off the popliteal artery.  We put Vesseloops proximally distally on the artery.  I then used a long Gore tunneler and tunneled from the below-knee popliteal space subfascial and subsartorial up into the groin where we had exposed the common femoral artery.  At that point in time patient was given 100 units/kg heparin and ACTs were checked to maintain them greater than 250.  We used Vesseloops to control the proximal distal common femoral artery.  The artery was then opened 11 blade scalpel extended Potts scissors and the vein was then spatulated in nonreversed fashion and a  end-to-side anastomosis was sewn in parachute technique.  Once we came off clamps there was flow down about three quarters of the bypass which I attribute to venous insufficiency given that her valves were incompetent.  I then used a valvulotome and lysed all  remaining valves and had pulsatile flow down the bypass.  We had several areas that had to be repaired with 6-0 Prolene's.  The vein bypass was then marked for orientation and then passed through the tunneler making sure to maintain the correct orientation using a 2-0 silk tie.  After the first tunnel we felt that the vein may have twisted.  We subsequently pulled the vein back and retunneled a second time.  After the second tunnel we had brisk bleeding from the tunnel and it was apparent that one of our sutures fell off.  We then pulled the vein out of the tunnel a second time and repaired one area where a suture had fallen off a branch.  We then tunneled a third time with a Gore tunneling and subfascial subsartorial.  The leg was straightened and the vein was cut to the appropriate length.  There was brisk pulsatile inflow.  We then pulled up on her Vesseloops proximally distally open the below-knee popliteal artery with 11 blade scalpel and extended with Pott scissors.  A 6-0 Prolene was sewn in parachute format  to the below-knee popliteal artery in end to side fashion.  Everything was de-aired prior to completion.  Patient had very brisk below-knee popliteal and posterior tibial and dorsalis pedis signals that completely disappeared when the graft was clamped.  Satisfied with the results patient was given 50 mg protamine for reversal.  All incisions were irrigated out.  The groin was closed with multiple layers of 2-0 Vicryl, 3-0 Vicryl, 4-0 Monocryl and dermabond in the skin.  Below-knee popliteal incision was closed with 2-0 Vicryl, 3-0 Vicryl, 4-0 Monocryl in the skin and Dermabond.  The rest of the vein harvest site incisions were closed with 3-0 Vicryl and 4-0 Monocryl and Dermabond.  She was taken to the PACU in stable condition.  Complication: None  Condition: Stable  [name], MD [facility] Office: [phone] Pager: [place]

## 2019-12-15 NOTE — Discharge Instructions (Signed)
 [facility]  Discharge instructions  Lower Extremity Bypass Surgery  Please refer to the following instruction for your post-procedure care. Your surgeon or physician assistant will discuss any changes with you.  Activity  You are encouraged to walk as much as you can. You can slowly return to normal activities during the month after your surgery. Avoid strenuous activity and heavy lifting until your doctor tells you it's OK. Avoid activities such as vacuuming or swinging a golf club. Do not drive until your doctor give the OK and you are no longer taking prescription pain medications. It is also normal to have difficulty with sleep habits, eating and bowel movement after surgery. These will go away with time.  Bathing/Showering  Shower daily after you go home. Do not soak in a bathtub, hot tub, or swim until the incision heals completely.  Incision Care  Clean your incision with mild soap and water. Shower every day. Pat the area dry with a clean towel. You do not need a bandage unless otherwise instructed. Do not apply any ointments or creams to your incision. If you have open wounds you will be instructed how to care for them or a visiting nurse may be arranged for you. If you have staples or sutures along your incision they will be removed at your post-op appointment. You may have skin glue on your incision. Do not peel it off. It will come off on its own in about one week.  Wash the groin wound with soap and water daily and pat dry. (No tub bath-only shower)  Then put a dry gauze or washcloth in the groin to keep this area dry to help prevent wound infection.  Do this daily and as needed.  Do not use Vaseline or neosporin on your incisions.  Only use soap and water on your incisions and then protect and keep dry.  Diet  Resume your normal diet. There are no special food restrictions following this procedure. A low fat/ low cholesterol diet is  recommended for all patients with vascular disease. In order to heal from your surgery, it is CRITICAL to get adequate nutrition. Your body requires vitamins, minerals, and protein. Vegetables are the best source of vitamins and minerals. Vegetables also provide the perfect balance of protein. Processed food has little nutritional value, so try to avoid this.  Medications  Resume taking all your medications unless your doctor or physician assistant tells you not to. If your incision is causing pain, you may take over-the-counter pain relievers such as acetaminophen (Tylenol). If you were prescribed a stronger pain medication, please aware these medication can cause nausea and constipation. Prevent nausea by taking the medication with a snack or meal. Avoid constipation by drinking plenty of fluids and eating foods with high amount of fiber, such as fruits, vegetables, and grains. Take Colace 100 mg (an over-the-counter stool softener) twice a day as needed for constipation.  Do not take Tylenol if you are taking prescription pain medications.  Follow Up  Our office will schedule a follow up appointment 2-3 weeks following discharge.  Please call us immediately for any of the following conditions  Severe or worsening pain in your legs or feet while at rest or while walking Increase pain, redness, warmth, or drainage (pus) from your incision site(s) Fever of 101 degree or higher The swelling in your leg with the bypass suddenly worsens and becomes more painful than when you were in the hospital If you have   been instructed to feel your graft pulse then you should do so every day. If you can no longer feel this pulse, call the office immediately. Not all patients are given this instruction.  Leg swelling is common after leg bypass surgery.  The swelling should improve over a few months following surgery. To improve the swelling, you may elevate your legs above the level of your heart while you are  sitting or resting. Your surgeon or physician assistant may ask you to apply an ACE wrap or wear compression (TED) stockings to help to reduce swelling.  Reduce your risk of vascular disease  Stop smoking. If you would like help call [facility] at 1-800-QUIT-NOW (1-800-784-8669) or [place] at 336-586-4000.  Manage your cholesterol Maintain a desired weight Control your diabetes weight Control your diabetes Keep your blood pressure down  If you have any questions, please call the office at [phone]  

## 2019-12-15 NOTE — Plan of Care (Signed)
Continue to monitor

## 2019-12-16 LAB — BASIC METABOLIC PANEL
Anion gap: 11 (ref 5–15)
BUN: 24 mg/dL — ABNORMAL HIGH (ref 8–23)
CO2: 21 mmol/L — ABNORMAL LOW (ref 22–32)
Calcium: 8 mg/dL — ABNORMAL LOW (ref 8.9–10.3)
Chloride: 104 mmol/L (ref 98–111)
Creatinine, Ser: 1.57 mg/dL — ABNORMAL HIGH (ref 0.44–1.00)
GFR calc Af Amer: 36 mL/min — ABNORMAL LOW (ref >60)
GFR calc non Af Amer: 31 mL/min — ABNORMAL LOW (ref >60)
Glucose, Bld: 232 mg/dL — ABNORMAL HIGH (ref 70–99)
Potassium: 4.9 mmol/L (ref 3.5–5.1)
Sodium: 136 mmol/L (ref 135–145)

## 2019-12-16 LAB — CBC
HCT: 29.3 % — ABNORMAL LOW (ref 36.0–46.0)
Hemoglobin: 9 g/dL — ABNORMAL LOW (ref 12.0–15.0)
MCH: 27.5 pg (ref 26.0–34.0)
MCHC: 30.7 g/dL (ref 30.0–36.0)
MCV: 89.6 fL (ref 80.0–100.0)
Platelets: 484 10*3/uL — ABNORMAL HIGH (ref 150–400)
RBC: 3.27 MIL/uL — ABNORMAL LOW (ref 3.87–5.11)
RDW: 16 % — ABNORMAL HIGH (ref 11.5–15.5)
WBC: 20.1 10*3/uL — ABNORMAL HIGH (ref 4.0–10.5)
nRBC: 0 % (ref 0.0–0.2)

## 2019-12-16 LAB — GLUCOSE, CAPILLARY
Glucose-Capillary: 196 mg/dL — ABNORMAL HIGH (ref 70–99)
Glucose-Capillary: 221 mg/dL — ABNORMAL HIGH (ref 70–99)
Glucose-Capillary: 165 mg/dL — ABNORMAL HIGH (ref 70–99)
Glucose-Capillary: 145 mg/dL — ABNORMAL HIGH (ref 70–99)

## 2019-12-16 NOTE — Anesthesia Postprocedure Evaluation (Signed)
Anesthesia Post Note  Patient: Sara Warner  Procedure(s) Performed: BYPASS GRAFT FEMORAL-POPLITEAL ARTERY (Right Leg Upper)     Patient location during evaluation: PACU Anesthesia Type: General Level of consciousness: awake and alert Pain management: pain level controlled Vital Signs Assessment: post-procedure vital signs reviewed and stable Respiratory status: spontaneous breathing, nonlabored ventilation, respiratory function stable and patient connected to nasal cannula oxygen Cardiovascular status: blood pressure returned to baseline and stable Postop Assessment: no apparent nausea or vomiting Anesthetic complications: no    Last Vitals:  Vitals:   12/16/19 0000 12/16/19 0400  BP: 125/60 (!) 117/52  Pulse: (!) 102 95  Resp: (!) 8 16  Temp: 36.4 C   SpO2: 99% 96%    Last Pain:  Vitals:   12/16/19 0000  TempSrc: Oral  PainSc:                  [place]

## 2019-12-16 NOTE — Progress Notes (Addendum)
Progress Note    12/16/2019 8:16 AM 1 Day Post-Op  Subjective:  Says her foot pain is gone.  She states she has had foot pain for 5 months.  Has not been up yet.    Afebrile   Vitals:   12/16/19 0000 12/16/19 0400  BP: 125/60 (!) 117/52  Pulse: (!) 102 95  Resp: (!) 8 16  Temp: 97.6 F (36.4 C)   SpO2: 99% 96%    Physical Exam: Cardiac:  regular Lungs:  Non labored Incisions:  All incisions look good. Extremities:  Brisk doppler signals right DP/PT   CBC    Component Value Date/Time   WBC 20.1 (H) 12/16/2019 0320   RBC 3.27 (L) 12/16/2019 0320   HGB 9.0 (L) 12/16/2019 0320   HGB 12.9 04/28/2019 1307   HGB 13.5 03/22/2018 0957   HGB 13.2 09/08/2017 1417   HCT 29.3 (L) 12/16/2019 0320   HCT 40.4 03/22/2018 0957   HCT 41.0 09/08/2017 1417   PLT 484 (H) 12/16/2019 0320   PLT 379 04/28/2019 1307   PLT 372 03/22/2018 0957   MCV 89.6 12/16/2019 0320   MCV 84 03/22/2018 0957   MCV 86.2 09/08/2017 1417   MCH 27.5 12/16/2019 0320   MCHC 30.7 12/16/2019 0320   RDW 16.0 (H) 12/16/2019 0320   RDW 14.7 03/22/2018 0957   RDW 15.9 (H) 09/08/2017 1417   LYMPHSABS 3.0 04/28/2019 1307   LYMPHSABS 3.6 (H) 09/08/2017 1417   MONOABS 0.7 04/28/2019 1307   MONOABS 0.7 09/08/2017 1417   EOSABS 0.4 04/28/2019 1307   EOSABS 0.2 09/08/2017 1417   BASOSABS 0.2 (H) 04/28/2019 1307   BASOSABS 0.1 09/08/2017 1417    BMET    Component Value Date/Time   NA 136 12/16/2019 0320   NA 140 03/22/2018 0957   NA 140 09/08/2017 1417   K 4.9 12/16/2019 0320   K 4.0 09/08/2017 1417   CL 104 12/16/2019 0320   CL 106 04/26/2013 1252   CO2 21 (L) 12/16/2019 0320   CO2 24 09/08/2017 1417   GLUCOSE 232 (H) 12/16/2019 0320   GLUCOSE 143 (H) 09/08/2017 1417   GLUCOSE 177 (H) 04/26/2013 1252   BUN 24 (H) 12/16/2019 0320   BUN 28 (H) 03/22/2018 0957   BUN 26.9 (H) 09/08/2017 1417   CREATININE 1.57 (H) 12/16/2019 0320   CREATININE 2.02 (H) 04/28/2019 1307   CREATININE 1.4 (H) 09/08/2017  1417   CALCIUM 8.0 (L) 12/16/2019 0320   CALCIUM 9.5 09/08/2017 1417   GFRNONAA 31 (L) 12/16/2019 0320   GFRNONAA 23 (L) 04/28/2019 1307   GFRAA 36 (L) 12/16/2019 0320   GFRAA 27 (L) 04/28/2019 1307    INR    Component Value Date/Time   INR 1.1 12/13/2019 1352     Intake/Output Summary (Last 24 hours) at 12/16/2019 0816 Last data filed at 12/16/2019 0700 Gross per 24 hour  Intake 3390.88 ml  Output 1355 ml  Net 2035.88 ml     Assessment:  79 y.o. female is s/p:  1.  Harvest of right great saphenous vein 2.  Right common femoral artery to below-knee popliteal artery bypass with ipsilateral nonreversed great saphenous vein  1 Day Post-Op  Plan: -pt doing well this morning with patent bypass with brisk doppler signals right DP/PT.  Preoperative pain resolved.   Should have good blood flow to heal wounds.  Discussed floating foot off the bed and not letting it rest on the foot board to prevent further pressure wounds.  -  all incisions look fine.  Ace wrap removed and does not need to be replaced -renal function stable. -Afib for > 20 years-pt is not on anticoagulation due to recurrent epstaxsis in the past.  Her cardiologist is Dr. Claiborne Billings who she last saw in September.  -discussed groin wound care with pt.  Please place dry gauze in right groin to wick moisture to help prevent wound infection. Please do not tape and replace as needed as pt gets up.  Thanks. -will mobilize today-will see how she does-possible discharge tomorrow. Pt states she has family around and will have help at home.  PT to evaluate pt for home needs.  -DVT prophylaxis:  Sq heparin to start this afternoon.   Leontine Locket, PA-C [facility] [phone] 12/16/2019 8:16 AM  I have seen and evaluated the patient. I agree with the PA note as documented above.  Postop day 1 status post right fem below-knee pop bypass with nonreversed ipsilateral great saphenous vein.  Incisions look good.  States  rest pain is resolved in right foot.  Very brisk dorsalis pedis and posterior tibial signals.  Working with therapy this morning.  Hemoglobin 9.8 to 9.  White count improving.  Creatinine stable at 1.57.  Making good progress postop.  [name], MD [facility] Office: [phone]

## 2019-12-16 NOTE — Plan of Care (Signed)
  Problem: Education: Goal: Knowledge of General Education information will improve Description Including pain rating scale, medication(s)/side effects and non-pharmacologic comfort measures Outcome: Progressing   Problem: Education: Goal: Knowledge of General Education information will improve Description Including pain rating scale, medication(s)/side effects and non-pharmacologic comfort measures Outcome: Progressing   

## 2019-12-16 NOTE — Evaluation (Signed)
Physical Therapy Evaluation Patient Details Name: Meigan Pates MRN: [id number] DOB: 1941/04/04 Today's Date: 12/16/2019   History of Present Illness  79 y.o. 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 Pt underwent R femoral to below knee popliteal artery bypass on 1/15.  Clinical Impression  Pt presents to PT with deficits in functional mobility, gait, balance, endurance, power, and strength. Pt requiring some physical assistance for transfers and verbal cues to slow down turns during ambulation at this time to reduce falls risk. Pt with reduced gait speed due to RLE soreness and will benefit from 3 trials of ambulation daily to progress mobility. Pt will benefit from HHPT at time of discharge and use of her Rollator, declining a RW at this time. PT recommends assistance from family for all OOB mobility initially upon return home to maintain safety.    Follow Up Recommendations Home health PT;Supervision/Assistance - 24 hour    Equipment Recommendations  Rolling walker with 5" wheels(pt declining RW, wants to use her Rollator)    Recommendations for Other Services       Precautions / Restrictions Precautions Precautions: Fall Restrictions Weight Bearing Restrictions: No      Mobility  Bed Mobility Overal bed mobility: Needs Assistance Bed Mobility: Supine to Sit     Supine to sit: Supervision;HOB elevated        Transfers Overall transfer level: Needs assistance Equipment used: Rolling walker (2 wheeled) Transfers: Sit to/from Stand Sit to Stand: Min assist            Ambulation/Gait Ambulation/Gait assistance: Min guard Gait Distance (Feet): 60 Feet Assistive device: Rolling walker (2 wheeled) Gait Pattern/deviations: Step-to pattern Gait velocity: reduced Gait  velocity interpretation: 1.31 - 2.62 ft/sec, indicative of limited community ambulator General Gait Details: pt with shortened step to gait, pt with one LOB during attempted impulsive 180 degree turn with RW requiring modA to correct, otherwise no LOB noted and pt performing 3 other turns well  Stairs            Wheelchair Mobility    Modified Rankin (Stroke Patients Only)       Balance Overall balance assessment: Needs assistance Sitting-balance support: No upper extremity supported;Feet supported Sitting balance-Leahy Scale: Good Sitting balance - Comments: supervision   Standing balance support: Bilateral upper extremity supported Standing balance-Leahy Scale: Fair Standing balance comment: minG with BUE support of RW                             Pertinent Vitals/Pain Pain Assessment: Faces Faces Pain Scale: Hurts even more Pain Location: RLE Pain Descriptors / Indicators: Sore Pain Intervention(s): Limited activity within patient's tolerance    Home Living Family/patient expects to be discharged to:: Private residence Living Arrangements: Children Available Help at Discharge: Family;Available 24 hours/day Type of Home: House Home Access: Ramped entrance     Home Layout: One level Home Equipment: Walker - 4 wheels;Cane - single point;Transport chair;Shower seat      Prior Function Level of Independence: Independent with assistive device(s)         Comments: pt ambulates household distances with Rollator     Hand Dominance        Extremity/Trunk Assessment   Upper Extremity Assessment Upper Extremity Assessment: Overall WFL for tasks assessed  Lower Extremity Assessment Lower Extremity Assessment: Generalized weakness    Cervical / Trunk Assessment Cervical / Trunk Assessment: Normal  Communication   Communication: No difficulties  Cognition Arousal/Alertness: Awake/alert Behavior During Therapy: WFL for tasks  assessed/performed Overall Cognitive Status: Within Functional Limits for tasks assessed                                        General Comments General comments (skin integrity, edema, etc.): VSS    Exercises     Assessment/Plan    PT Assessment Patient needs continued PT services  PT Problem List Decreased strength;Decreased activity tolerance;Decreased balance;Decreased mobility;Decreased knowledge of use of DME;Pain       PT Treatment Interventions DME instruction;Gait training;Functional mobility training;Therapeutic activities;Therapeutic exercise;Balance training;Neuromuscular re-education;Patient/family education    PT Goals (Current goals can be found in the Care Plan section)  Acute Rehab PT Goals Patient Stated Goal: To improve mobility and go home PT Goal Formulation: With patient Time For Goal Achievement: 12/30/19 Potential to Achieve Goals: Good    Frequency Min 3X/week   Barriers to discharge        Co-evaluation               AM-PAC PT "6 Clicks" Mobility  Outcome Measure Help needed turning from your back to your side while in a flat bed without using bedrails?: None Help needed moving from lying on your back to sitting on the side of a flat bed without using bedrails?: None Help needed moving to and from a bed to a chair (including a wheelchair)?: A Little Help needed standing up from a chair using your arms (e.g., wheelchair or bedside chair)?: A Little Help needed to walk in hospital room?: A Little Help needed climbing 3-5 steps with a railing? : A Lot 6 Click Score: 19    End of Session Equipment Utilized During Treatment: (none) Activity Tolerance: Patient tolerated treatment well Patient left: in chair;with call bell/phone within reach Nurse Communication: Mobility status PT Visit Diagnosis: Other abnormalities of gait and mobility (R26.89)    Time: 4034-7425 PT Time Calculation (min) (ACUTE ONLY): 32  min   Charges:   PT Evaluation $PT Eval Low Complexity: [place], PT, DPT Acute Rehabilitation Pager: [phone]   Zenaida Niece 12/16/2019, 12:35 PM

## 2019-12-16 NOTE — Evaluation (Signed)
Occupational Therapy Evaluation Patient Details Name: Sara Warner MRN: [id number] DOB: 02-17-1941 Today's Date: 12/16/2019    History of Present Illness 79 y.o. 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 Pt underwent R femoral to below knee popliteal artery bypass on 1/15.   Clinical Impression   Pt PTA: Pt living with family. Pt reports independence with ADL and mobility prior. Pt currently limited by decreased activity tolerance and ability to perform LB dressing. Pt currently set-upA in standing for grooming and toilet hygiene with pericare as long as RW is nearby for stability for sink to hold onto. Pt minA for LB ADL mostly for RLE due to decreased ROM at this time. No pain reported. Pt performing mobility in room with RW and supervisionA and minguardA for initial standing balance. No physical assist required. Pt's daughter in room for session. Pt does not require continued acute ot, but would benefit from continued OT in [facility] setting. OT signing off.      Follow Up Recommendations  Home health OT;Supervision - Intermittent    Equipment Recommendations  None recommended by OT    Recommendations for Other Services       Precautions / Restrictions Precautions Precautions: Fall Restrictions Weight Bearing Restrictions: No      Mobility Bed Mobility Overal bed mobility: Needs Assistance Bed Mobility: Supine to Sit     Supine to sit: Supervision;HOB elevated        Transfers Overall transfer level: Needs assistance Equipment used: Rolling walker (2 wheeled) Transfers: Sit to/from Stand Sit to Stand: Min guard         General transfer comment: RW for stability    Balance Overall balance assessment: Needs assistance Sitting-balance support: No upper extremity  supported;Feet supported Sitting balance-Leahy Scale: Good Sitting balance - Comments: supervision   Standing balance support: Bilateral upper extremity supported Standing balance-Leahy Scale: Fair Standing balance comment: using RW                           ADL either performed or assessed with clinical judgement   ADL Overall ADL's : Needs assistance/impaired Eating/Feeding: Modified independent;Sitting   Grooming: Supervision/safety;Standing   Upper Body Bathing: Set up;Sitting   Lower Body Bathing: Minimal assistance;Sitting/lateral leans;Sit to/from stand   Upper Body Dressing : Supervision/safety;Standing   Lower Body Dressing: Minimal assistance;Sitting/lateral leans;Sit to/from stand;Cueing for safety Lower Body Dressing Details (indicate cue type and reason): assist for RLE Toilet Transfer: Min guard;RW;Cueing for safety   Toileting- Clothing Manipulation and Hygiene: Supervision/safety;Sitting/lateral lean;Sit to/from stand;Cueing for safety;Cueing for sequencing Toileting - Clothing Manipulation Details (indicate cue type and reason): no physical assist required in standing; 2 washcloths used for pericare in standing     Functional mobility during ADLs: Supervision/safety;Rolling walker;Cueing for safety General ADL Comments: Pt limited by decreased activity tolerance and ability to perform LB dressing.     Vision Baseline Vision/History: Wears glasses Wears Glasses: At all times Patient Visual Report: No change from baseline Vision Assessment?: No apparent visual deficits     Perception     Praxis      Pertinent Vitals/Pain Pain Assessment: Faces Faces Pain Scale: No hurt Pain Intervention(s): Monitored during session;Premedicated before session     Hand Dominance Right   Extremity/Trunk Assessment Upper Extremity Assessment Upper Extremity Assessment: Generalized weakness;Overall [facility] for  tasks assessed   Lower Extremity Assessment Lower  Extremity Assessment: Generalized weakness;RLE deficits/detail RLE Deficits / Details: s/p popliteal a. bypass   Cervical / Trunk Assessment Cervical / Trunk Assessment: Normal   Communication Communication Communication: No difficulties   Cognition Arousal/Alertness: Awake/alert Behavior During Therapy: WFL for tasks assessed/performed Overall Cognitive Status: Within Functional Limits for tasks assessed                                     General Comments  VSS.    Exercises     Shoulder Instructions      Home Living Family/patient expects to be discharged to:: Private residence Living Arrangements: Children Available Help at Discharge: Family;Available 24 hours/day Type of Home: House Home Access: Ramped entrance     Home Layout: One level     Bathroom Shower/Tub: Teacher, early years/pre: Handicapped height     Home Equipment: Environmental consultant - 4 wheels;Cane - single point;Transport chair;Shower seat          Prior Functioning/Environment Level of Independence: Independent with assistive device(s)        Comments: pt ambulates household distances with Rollator        OT Problem List: Decreased activity tolerance;Pain;Impaired balance (sitting and/or standing)      OT Treatment/Interventions:      OT Goals(Current goals can be found in the care plan section) Acute Rehab OT Goals Patient Stated Goal: To improve mobility and go home OT Goal Formulation: With patient  OT Frequency:     Barriers to D/C:            Co-evaluation              AM-PAC OT "6 Clicks" Daily Activity     Outcome Measure Help from another person eating meals?: None Help from another person taking care of personal grooming?: A Little Help from another person toileting, which includes using toliet, bedpan, or urinal?: None Help from another person bathing (including washing, rinsing, drying)?: A Little Help from another person to put on and taking off  regular upper body clothing?: None Help from another person to put on and taking off regular lower body clothing?: A Little 6 Click Score: 21   End of Session Equipment Utilized During Treatment: Gait belt;Rolling walker Nurse Communication: Mobility status  Activity Tolerance: Patient tolerated treatment well Patient left: in chair;with call bell/phone within reach;with family/visitor present  OT Visit Diagnosis: Unsteadiness on feet (R26.81);Muscle weakness (generalized) (M62.81);Pain Pain - Right/Left: Right Pain - part of body: Leg                Time: 6333-5456 OT Time Calculation (min): 20 min Charges:     Jefferey Pica OTR/L Acute Rehabilitation Services Pager: [phone] Office: [phone]   [id number] C 12/16/2019, 2:05 PM

## 2019-12-17 LAB — GLUCOSE, CAPILLARY
Glucose-Capillary: 142 mg/dL — ABNORMAL HIGH (ref 70–99)
Glucose-Capillary: 198 mg/dL — ABNORMAL HIGH (ref 70–99)

## 2019-12-17 MED ORDER — ACETAMINOPHEN 500 MG PO TABS: 500.0000 mg | ORAL_TABLET | Freq: Four times a day (QID) | ORAL | Status: AC | PRN

## 2019-12-17 MED ORDER — ASPIRIN EC 81 MG PO TBEC: 81.0000 mg | DELAYED_RELEASE_TABLET | Freq: Every day | ORAL | Status: DC

## 2019-12-17 MED ORDER — OXYCODONE-ACETAMINOPHEN 5-325 MG PO TABS: 1.0000 | ORAL_TABLET | Freq: Four times a day (QID) | ORAL | 0 refills | Status: DC | PRN

## 2019-12-17 MED ORDER — ATORVASTATIN CALCIUM 20 MG PO TABS: 20.0000 mg | ORAL_TABLET | Freq: Every day | ORAL | 3 refills | Status: DC

## 2019-12-17 NOTE — Progress Notes (Signed)
Physical Therapy Treatment Patient Details Name: Sara Warner MRN: [id number] DOB: 1941/09/28 Today's Date: 12/17/2019    History of Present Illness 79 y.o. 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 Pt underwent R femoral to below knee popliteal artery bypass on 1/15.    PT Comments    Pt tolerated treatment well, already ambulating with RN upon PT arrival. Pt with improved gait and activity tolerance. Pt with reduced physical assistance requirements during transfers, with improved ability to power up through LE and improved transfer technique overall. Pt will benefit from continued acute PT services to continue improving upon activity tolerance and to restore independence in mobility.   Follow Up Recommendations  Home health PT;Supervision/Assistance - 24 hour     Equipment Recommendations  None recommended by PT(pt declining RW, owns rollator)    Recommendations for Other Services       Precautions / Restrictions Precautions Precautions: Fall Restrictions Weight Bearing Restrictions: No    Mobility  Bed Mobility Overal bed mobility: (pt recieved standing in hallway with RN, left in recliner)                Transfers Overall transfer level: Needs assistance Equipment used: Rolling walker (2 wheeled) Transfers: Sit to/from Stand Sit to Stand: Supervision         General transfer comment: Pt providing verbal cues to improve transfer technique, forward lean, foot positioning, and hand placement  Ambulation/Gait Ambulation/Gait assistance: Supervision Gait Distance (Feet): 40 Feet(pt already ambulating ~40' with RN upon PT arrival) Assistive device: Rolling walker (2 wheeled) Gait Pattern/deviations: Step-to pattern Gait velocity: reduced Gait velocity  interpretation: 1.31 - 2.62 ft/sec, indicative of limited community ambulator General Gait Details: shortened step to gait with slight increase in trunk flexion, no LOB noted   Stairs             Wheelchair Mobility    Modified Rankin (Stroke Patients Only)       Balance Overall balance assessment: Needs assistance Sitting-balance support: No upper extremity supported;Feet supported Sitting balance-Leahy Scale: Normal     Standing balance support: Single extremity supported Standing balance-Leahy Scale: Good Standing balance comment: close supervision with unilateral UE support of RW                            Cognition Arousal/Alertness: Awake/alert Behavior During Therapy: WFL for tasks assessed/performed Overall Cognitive Status: Within Functional Limits for tasks assessed                                        Exercises      General Comments General comments (skin integrity, edema, etc.): VSS on RA      Pertinent Vitals/Pain Pain Assessment: Faces Faces Pain Scale: Hurts little more Pain Location: RLE Pain Descriptors / Indicators: Sore Pain Intervention(s): Limited activity within patient's tolerance    Home Living                      Prior Function            PT Goals (current goals can now be found in the care plan section) Acute Rehab PT Goals Patient Stated Goal: To improve mobility and go  home Progress towards PT goals: Progressing toward goals    Frequency    Min 3X/week      PT Plan Current plan remains appropriate    Co-evaluation              AM-PAC PT "6 Clicks" Mobility   Outcome Measure  Help needed turning from your back to your side while in a flat bed without using bedrails?: None Help needed moving from lying on your back to sitting on the side of a flat bed without using bedrails?: None Help needed moving to and from a bed to a chair (including a wheelchair)?: None Help  needed standing up from a chair using your arms (e.g., wheelchair or bedside chair)?: None Help needed to walk in hospital room?: A Little Help needed climbing 3-5 steps with a railing? : A Little 6 Click Score: 22    End of Session Equipment Utilized During Treatment: (none) Activity Tolerance: Patient tolerated treatment well Patient left: in chair;with call bell/phone within reach Nurse Communication: Mobility status PT Visit Diagnosis: Other abnormalities of gait and mobility (R26.89)     Time: 6659-9357 PT Time Calculation (min) (ACUTE ONLY): 11 min  Charges:  $Gait Training: 8-22 mins                     Zenaida Niece, PT, DPT Acute Rehabilitation Pager: [phone]    Zenaida Niece 12/17/2019, 12:24 PM

## 2019-12-17 NOTE — TOC Transition Note (Addendum)
Transition of Care [facility]) - CM/SW Discharge Note   Patient Details  Name: Logen Fowle MRN: [id number] Date of Birth: 22-Aug-1941  Transition of Care [facility]) CM/SW Contact:  Claudie Leach, RN 12/17/2019, 11:33 AM   Clinical Narrative:    Patient to d/c home with PT/OT.  Patient has no preference of agency.  [facility] is unable to accept due to staffing.  Sharmon Revere with [facility] accepts patient referral.    Patient's physical address is [address] [place], [place].  No DME needs. Patient states she has a walker.   Final next level of care: Home w Home Health Services Barriers to Discharge: No Barriers Identified   Patient Goals and CMS Choice   CMS Medicare.gov Compare Post Acute Care list provided to:: Patient Choice offered to / list presented to : Patient   Discharge Plan and Services    HH Arranged: PT, OT [facility] Agency: [place] Date [place]: 12/17/19 Time [place]: 2229 Representative spoke with at [place]: Malachy Mood

## 2019-12-17 NOTE — Progress Notes (Addendum)
Progress Note    12/17/2019 7:37 AM 2 Days Post-Op  Subjective:  Wants to go home  afebrile  Vitals:   12/16/19 2345 12/17/19 0339  BP: 98/62 113/65  Pulse: 94 95  Resp: 14   Temp: 98.5 F (36.9 C) 97.8 F (36.6 C)  SpO2: 99% 99%    Physical Exam: Cardiac:  irregular Lungs:  Non labored Incisions:  All incisions are clean and dry  Extremities:  Palpable right DP pulse with brisk doppler signals right DP/PT   CBC    Component Value Date/Time   WBC 20.1 (H) 12/16/2019 0320   RBC 3.27 (L) 12/16/2019 0320   HGB 9.0 (L) 12/16/2019 0320   HGB 12.9 04/28/2019 1307   HGB 13.5 03/22/2018 0957   HGB 13.2 09/08/2017 1417   HCT 29.3 (L) 12/16/2019 0320   HCT 40.4 03/22/2018 0957   HCT 41.0 09/08/2017 1417   PLT 484 (H) 12/16/2019 0320   PLT 379 04/28/2019 1307   PLT 372 03/22/2018 0957   MCV 89.6 12/16/2019 0320   MCV 84 03/22/2018 0957   MCV 86.2 09/08/2017 1417   MCH 27.5 12/16/2019 0320   MCHC 30.7 12/16/2019 0320   RDW 16.0 (H) 12/16/2019 0320   RDW 14.7 03/22/2018 0957   RDW 15.9 (H) 09/08/2017 1417   LYMPHSABS 3.0 04/28/2019 1307   LYMPHSABS 3.6 (H) 09/08/2017 1417   MONOABS 0.7 04/28/2019 1307   MONOABS 0.7 09/08/2017 1417   EOSABS 0.4 04/28/2019 1307   EOSABS 0.2 09/08/2017 1417   BASOSABS 0.2 (H) 04/28/2019 1307   BASOSABS 0.1 09/08/2017 1417    BMET    Component Value Date/Time   NA 136 12/16/2019 0320   NA 140 03/22/2018 0957   NA 140 09/08/2017 1417   K 4.9 12/16/2019 0320   K 4.0 09/08/2017 1417   CL 104 12/16/2019 0320   CL 106 04/26/2013 1252   CO2 21 (L) 12/16/2019 0320   CO2 24 09/08/2017 1417   GLUCOSE 232 (H) 12/16/2019 0320   GLUCOSE 143 (H) 09/08/2017 1417   GLUCOSE 177 (H) 04/26/2013 1252   BUN 24 (H) 12/16/2019 0320   BUN 28 (H) 03/22/2018 0957   BUN 26.9 (H) 09/08/2017 1417   CREATININE 1.57 (H) 12/16/2019 0320   CREATININE 2.02 (H) 04/28/2019 1307   CREATININE 1.4 (H) 09/08/2017 1417   CALCIUM 8.0 (L) 12/16/2019 0320   CALCIUM 9.5 09/08/2017 1417   GFRNONAA 31 (L) 12/16/2019 0320   GFRNONAA 23 (L) 04/28/2019 1307   GFRAA 36 (L) 12/16/2019 0320   GFRAA 27 (L) 04/28/2019 1307    INR    Component Value Date/Time   INR 1.1 12/13/2019 1352    No intake or output data in the 24 hours ending 12/17/19 0737   Assessment:  79 y.o. female is s/p:  Right common femoralarteryto below-knee poplitealarterybypasswithipsilateral nonreversed great saphenous vein  2 Days Post-Op  Plan: -pt with palpable right DP pulse -PT/OT recommending HHPT/OT -discharge home today once HH needs are arranged. -plavix/asa   Leontine Locket, PA-C [facility] [phone] 12/17/2019 7:37 AM  I have seen and evaluated the patient. I agree with the PA note as documented above.  Postop day 2 status post right common femoral to below-knee popliteal artery bypass with ipsilateral nonreversed saphenous vein.  Has palpable dorsalis pedis pulse confirmed with very brisk signals.  Incisions are healing appropriately.  She is ambulating and wants to go home.  Pain well controlled.  We will arrange home health  PT OT.  Discussed need to continue aspirin Plavix for her right iliac stents.  I will plan to see her in 2 to 3 weeks for wound checks.  I am hopeful that the wounds on her right shin as well as the heel and toe can heal now that she has had revascularization.  Discussed soap water and keeping these dry and clean.  [name], MD [facility] [place] Office: [phone]

## 2019-12-17 NOTE — Progress Notes (Signed)
D/C instructions and wound care given to pt and daughter. All questions answered. IV removed, clean and intact. Daughter to escort pt home.  Clyde Canterbury, RN

## 2019-12-17 NOTE — Discharge Summary (Addendum)
Discharge Summary     Sara Warner Jan 18, 1941 79 y.o. female  [id number]  Admission Date: 12/15/2019  Discharge Date: 12/17/2019  Physician: [name], MD  Admission Diagnosis: PAD (peripheral artery disease) ([place]) [I73.9]  HPI:   This is a 79 y.o. 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 Patient states she got the wound approximately 4 months ago when she tripped on the carpet. Ultimately the wounds have not healed and she now has a wound on her right shin as well as her right heel and right great toe. She underwent noninvasive [facility] in [facility] and has a severely depressed ABI of 0.21 in the right lower extremity. Her ABI on the left is 0.53. Patient denies any previous lower extremity interventions. She previously walked with a cane but is now walking with a walker following her injury. She does live at home independently.   Hospital Course:  The patient was admitted to the hospital and taken to the operating room on 12/15/2019 and underwent: 1.  Harvest of right great saphenous vein 2.  Right common femoral artery to below-knee popliteal artery bypass with ipsilateral nonreversed great saphenous vein    Findings: The right great saphenous vein was harvested from the groin to below the knee with skip incisions.  This was sewn in nonreversed fashion to the common femoral artery and tunneled subfascial to the below-knee popliteal artery.  Patient had brisk dorsalis pedis and posterior tibial signals at completion of the case.  The pt tolerated the procedure well and was transported to the PACU in good condition.   By POD 1, she was doing well.  Preoperative rest pain resolved.  Incisions look good.  Pt evaluated by PT and OT and recommend HHPT/OT.   Renal  function stable.   By POD 2, pt doing well with palpable right DP pulse.  She has ambulated.  She will continue plavix.  She will start asa.    The remainder of the hospital course consisted of increasing mobilization and increasing intake of solids without difficulty.  CBC    Component Value Date/Time   WBC 20.1 (H) 12/16/2019 0320   RBC 3.27 (L) 12/16/2019 0320   HGB 9.0 (L) 12/16/2019 0320   HGB 12.9 04/28/2019 1307   HGB 13.5 03/22/2018 0957   HGB 13.2 09/08/2017 1417   HCT 29.3 (L) 12/16/2019 0320   HCT 40.4 03/22/2018 0957   HCT 41.0 09/08/2017 1417   PLT 484 (H) 12/16/2019 0320   PLT 379 04/28/2019 1307   PLT 372 03/22/2018 0957   MCV 89.6 12/16/2019 0320   MCV 84 03/22/2018 0957   MCV 86.2 09/08/2017 1417   MCH 27.5 12/16/2019 0320   MCHC 30.7 12/16/2019 0320   RDW 16.0 (H) 12/16/2019 0320   RDW 14.7 03/22/2018 0957   RDW 15.9 (H) 09/08/2017 1417   LYMPHSABS 3.0 04/28/2019 1307   LYMPHSABS 3.6 (H) 09/08/2017 1417   MONOABS 0.7 04/28/2019 1307   MONOABS 0.7 09/08/2017 1417   EOSABS 0.4 04/28/2019 1307   EOSABS 0.2 09/08/2017 1417   BASOSABS 0.2 (H) 04/28/2019 1307   BASOSABS 0.1 09/08/2017 1417    BMET    Component Value Date/Time   NA 136 12/16/2019 0320   NA 140 03/22/2018 0957   NA 140 09/08/2017 1417   K 4.9 12/16/2019 0320  K 4.0 09/08/2017 1417   CL 104 12/16/2019 0320   CL 106 04/26/2013 1252   CO2 21 (L) 12/16/2019 0320   CO2 24 09/08/2017 1417   GLUCOSE 232 (H) 12/16/2019 0320   GLUCOSE 143 (H) 09/08/2017 1417   GLUCOSE 177 (H) 04/26/2013 1252   BUN 24 (H) 12/16/2019 0320   BUN 28 (H) 03/22/2018 0957   BUN 26.9 (H) 09/08/2017 1417   CREATININE 1.57 (H) 12/16/2019 0320   CREATININE 2.02 (H) 04/28/2019 1307   CREATININE 1.4 (H) 09/08/2017 1417   CALCIUM 8.0 (L) 12/16/2019 0320   CALCIUM 9.5 09/08/2017 1417   GFRNONAA 31 (L) 12/16/2019 0320   GFRNONAA 23 (L) 04/28/2019 1307   GFRAA 36 (L) 12/16/2019 0320   GFRAA 27 (L) 04/28/2019 1307      Discharge Instructions    Discharge patient   Complete by: As directed    Discharge home once [facility] needs are arranged.  Thanks!   Discharge disposition: 01-Home or Self Care   Discharge patient date: 12/17/2019      Discharge Diagnosis:  PAD (peripheral artery disease) ([place]) [I73.9]  Secondary Diagnosis: Patient Active Problem List   Diagnosis Date Noted  . PAD (peripheral artery disease) ([place]) 12/15/2019  . Critical lower limb ischemia 11/28/2019  . Bilateral impacted cerumen 06/23/2017  . 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 NICM (nonischemic cardiomyopathy) ([place])   . Chronic atrial fibrillation ([place])   . Hypertensive heart disease   . Stage III chronic kidney disease 10/02/2015  . Controlled type 2 diabetes mellitus without complication ([place]) 16/08/9603  . 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 Atrial fibrillation ([place]) 01/05/2012  . Hypothyroid 01/05/2012  . Cardiomyopathy ([place]) 01/05/2012   Past Medical History:  Diagnosis Date  . Atrial fibrillation ([place])   . Chronic atrial fibrillation ([place])    a. Refuses [place].  CHA2DS2VASc = 4.  . Chronic systolic CHF (congestive heart failure) ([place])    a. 05/2015 Echo: EF 35-40%.  . CKD (chronic kidney disease), stage III   . 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 DM (diabetes mellitus) ([place])   . Dysrhythmia   . Elevated WBC count   . Hypertension   . Hypertensive heart disease   . Hypothyroidism   . NICM (nonischemic cardiomyopathy) ([place])    a. 06/2013 MV: low risk study w/o ischemia;  b. 05/2015 Echo: EF 35-40%, diff HK, basal-midinferoseptal and basal-midanteroseptal AK. Mild-mod MR, mod dil LA, mild to mod TR, PASP 74mmHg.  Marland Kitchen PAD (peripheral artery disease) ([place])   . Pneumonia    30 yrs. ago  . Toxic goiter      Allergies as of 12/17/2019      Reactions   Losartan Other (See Comments)   Patient reports nightmares/vivid  dreams when taking.   Lisinopril Cough      Medication List    STOP taking these medications   neomycin-polymyxin-hydrocortisone OTIC solution Commonly known as: CORTISPORIN   silver sulfADIAZINE 1 % cream Commonly known as: Silvadene     TAKE these medications   acetaminophen 500 MG tablet Commonly known as: TYLENOL Take 1 tablet (500 mg total) by mouth every 6 (six) hours as needed for up to 1 day. What changed: how much to take   aspirin EC 81 MG tablet Take 1 tablet (81 mg total) by mouth daily.   atorvastatin 20 MG tablet Commonly known as: LIPITOR Take 1 tablet (20 mg total)  by mouth daily at 6 PM.   clopidogrel 75 MG tablet Commonly known as: Plavix Take 1 tablet (75 mg total) by mouth daily.   clorazepate 7.5 MG tablet Commonly known as: Tranxene-T Take 1 tablet (7.5 mg total) by mouth 2 (two) times daily as needed for anxiety.   digoxin 0.125 MG tablet Commonly known as: LANOXIN Take 0.5 tablets (0.0625 mg total) by mouth every other day.   furosemide 20 MG tablet Commonly known as: LASIX TAKE 2 TABLETS IN THE MORNING AND AN EXTRA TABLET IF GAIN 3 OR MORE POUNDS IN ONE DAY SCHEDULE OV. What changed:   how much to take  how to take this  when to take this  additional instructions   glipiZIDE 10 MG 24 hr tablet Commonly known as: GLUCOTROL XL Take 10 mg by mouth 2 (two) times daily.   Januvia 50 MG tablet Generic drug: sitaGLIPtin Take 50 mg by mouth daily.   metoprolol 200 MG 24 hr tablet Commonly known as: TOPROL-XL TAKE 1 TABLET BY MOUTH DAILY.   nilotinib 150 MG capsule Commonly known as: Tasigna Take 2 capsules (300 mg total) by mouth every 12 (twelve) hours. Take on an empty stomach, 1 hour before or 2 hours after meals.   oxyCODONE-acetaminophen 5-325 MG tablet Commonly known as: Percocet Take 1 tablet by mouth every 6 (six) hours as needed.   spironolactone 25 MG tablet Commonly known as: ALDACTONE Take 0.5 tablets (12.5 mg  total) by mouth 2 (two) times daily. What changed: when to take this   Synthroid 137 MCG tablet Generic drug: levothyroxine Take 137 mcg by mouth daily before breakfast.   Trulicity 5.32 DJ/2.4QA Sopn Generic drug: Dulaglutide Inject 0.75 mg into the skin every Sunday.       Discharge Instructions: [facility] Discharge instructions Lower Extremity Bypass Surgery  Please refer to the following instruction for your post-procedure care. Your surgeon or physician assistant will discuss any changes with you.  Activity  You are encouraged to walk as much as you can. You can slowly return to normal activities during the month after your surgery. Avoid strenuous activity and heavy lifting until your doctor tells you it's OK. Avoid activities such as vacuuming or swinging a golf club. Do not drive until your doctor give the OK and you are no longer taking prescription pain medications. It is also normal to have difficulty with sleep habits, eating and bowel movement after surgery. These will go away with time.  Bathing/Showering  You may shower after you go home. Do not soak in a bathtub, hot tub, or swim until the incision heals completely.  Incision Care  Clean your incision with mild soap and water. Shower every day. Pat the area dry with a clean towel. You do not need a bandage unless otherwise instructed. Do not apply any ointments or creams to your incision. If you have open wounds you will be instructed how to care for them or a visiting nurse may be arranged for you. If you have staples or sutures along your incision they will be removed at your post-op appointment. You may have skin glue on your incision. Do not peel it off. It will come off on its own in about one week.  Wash the groin wound with soap and water daily and pat dry. (No tub bath-only shower)  Then put a dry gauze or washcloth in the groin to keep this area dry to help prevent wound  infection.  Do this daily and as needed.  Do not use Vaseline or neosporin on your incisions.  Only use soap and water on your incisions and then protect and keep dry.  Diet  Resume your normal diet. There are no special food restrictions following this procedure. A low fat/ low cholesterol diet is recommended for all patients with vascular disease. In order to heal from your surgery, it is CRITICAL to get adequate nutrition. Your body requires vitamins, minerals, and protein. Vegetables are the best source of vitamins and minerals. Vegetables also provide the perfect balance of protein. Processed food has little nutritional value, so try to avoid this.  Medications  Resume taking all your medications unless your doctor or Physician Assistant tells you not to. If your incision is causing pain, you may take over-the-counter pain relievers such as acetaminophen (Tylenol). If you were prescribed a stronger pain medication, please aware these medication can cause nausea and constipation. Prevent nausea by taking the medication with a snack or meal. Avoid constipation by drinking plenty of fluids and eating foods with high amount of fiber, such as fruits, vegetables, and grains. Take Colace 100 mg (an over-the-counter stool softener) twice a day as needed for constipation.  Do not take Tylenol if you are taking prescription pain medications.  Follow Up  Our office will schedule a follow up appointment 2-3 weeks following discharge.  Please call us immediately for any of the following conditions  .Severe or worsening pain in your legs or feet while at rest or while walking .Increase pain, redness, warmth, or drainage (pus) from your incision site(s) . Fever of 101 degree or higher . The swelling in your leg with the bypass suddenly worsens and becomes more painful than when you were in the hospital . If you have been instructed to feel your graft pulse then you should do so every day. If you can no  longer feel this pulse, call the office immediately. Not all patients are given this instruction. .  Leg swelling is common after leg bypass surgery.  The swelling should improve over a few months following surgery. To improve the swelling, you may elevate your legs above the level of your heart while you are sitting or resting. Your surgeon or physician assistant may ask you to apply an ACE wrap or wear compression (TED) stockings to help to reduce swelling.  Reduce your risk of vascular disease  Stop smoking. If you would like help call [facility] at 1-800-QUIT-NOW (581)558-4461) or [place] at 825-689-4958.  . Manage your cholesterol . Maintain a desired weight . Control your diabetes weight . Control your diabetes . Keep your blood pressure down .  If you have any questions, please call the office at [phone]   Prescriptions given: 1.  Roxicet #20 No Refill 2.  Aspirin 81mg  daily (OTC) 3.  Lipitor 20mg  daily #30 three refills  Disposition: home with HHPT/OT  Patient's condition: is Good  Follow up: 1. Dr. Carlis Abbott in 2-3 weeks   Leontine Locket, PA-C [facility] [phone] 12/17/2019  9:10 AM  - For VQI Registry use ---   Post-op:  Wound infection: No  Graft infection: No  Transfusion: No    If yes, n/a units given New Arrhythmia: No Ipsilateral amputation: No, [ ]  Minor, [ ]  BKA, [ ]  AKA Discharge patency: [x ] Primary, [ ]  Primary assisted, [ ]  Secondary, [ ]  Occluded Patency judged by: [ ]  Dopper only, [ ]  Palpable graft pulse, [x]  Palpable distal  pulse, [ ]  ABI inc. > 0.15, [ ]  Duplex Discharge ABI: R not done, L  D/C Ambulatory Status: Ambulatory with Assistance  Complications: MI: No, [ ]  Troponin only, [ ]  EKG or Clinical CHF: No Resp failure:No, [ ]  Pneumonia, [ ]  Ventilator Chg in renal function: No, [ ]  Inc. Cr > 0.5, [ ]  Temp. Dialysis,  [ ]  Permanent dialysis Stroke: No, [ ]  Minor, [ ]  Major Return to OR: No  Reason  for return to OR: [ ]  Bleeding, [ ]  Infection, [ ]  Thrombosis, [ ]  Revision  Discharge medications: Statin use:  yes ASA use:  yes Plavix use:  yes Beta blocker use: yes CCB use:  No ACEI use:   no ARB use:  no Coumadin use: no

## 2019-12-18 LAB — POCT ACTIVATED CLOTTING TIME
Activated Clotting Time: 230 seconds
Activated Clotting Time: 219 seconds

## 2019-12-28 DIAGNOSIS — Z7984 Long term (current) use of oral hypoglycemic drugs: Secondary | ICD-10-CM | POA: Diagnosis not present

## 2019-12-28 DIAGNOSIS — Z95820 Peripheral vascular angioplasty status with implants and grafts: Secondary | ICD-10-CM | POA: Diagnosis not present

## 2019-12-28 DIAGNOSIS — I482 Chronic atrial fibrillation, unspecified: Secondary | ICD-10-CM | POA: Diagnosis not present

## 2019-12-28 DIAGNOSIS — I5022 Chronic systolic (congestive) heart failure: Secondary | ICD-10-CM | POA: Diagnosis not present

## 2019-12-28 DIAGNOSIS — E1122 Type 2 diabetes mellitus with diabetic chronic kidney disease: Secondary | ICD-10-CM | POA: Diagnosis not present

## 2019-12-28 DIAGNOSIS — Z79899 Other long term (current) drug therapy: Secondary | ICD-10-CM | POA: Diagnosis not present

## 2019-12-28 DIAGNOSIS — Z8701 Personal history of pneumonia (recurrent): Secondary | ICD-10-CM | POA: Diagnosis not present

## 2019-12-28 DIAGNOSIS — N183 Chronic kidney disease, stage 3 unspecified: Secondary | ICD-10-CM | POA: Diagnosis not present

## 2019-12-28 DIAGNOSIS — E039 Hypothyroidism, unspecified: Secondary | ICD-10-CM | POA: Diagnosis not present

## 2019-12-28 DIAGNOSIS — Z48812 Encounter for surgical aftercare following surgery on the circulatory system: Secondary | ICD-10-CM | POA: Diagnosis not present

## 2019-12-28 DIAGNOSIS — E0520 Thyrotoxicosis with toxic multinodular goiter without thyrotoxic crisis or storm: Secondary | ICD-10-CM | POA: Diagnosis not present

## 2019-12-28 DIAGNOSIS — I130 Hypertensive heart and chronic kidney disease with heart failure and stage 1 through stage 4 chronic kidney disease, or unspecified chronic kidney disease: Secondary | ICD-10-CM | POA: Diagnosis not present

## 2019-12-28 DIAGNOSIS — I428 Other cardiomyopathies: Secondary | ICD-10-CM | POA: Diagnosis not present

## 2019-12-28 DIAGNOSIS — E1151 Type 2 diabetes mellitus with diabetic peripheral angiopathy without gangrene: Secondary | ICD-10-CM | POA: Diagnosis not present

## 2019-12-28 DIAGNOSIS — I70201 Unspecified atherosclerosis of native arteries of extremities, right leg: Secondary | ICD-10-CM | POA: Diagnosis not present

## 2019-12-28 DIAGNOSIS — Z7902 Long term (current) use of antithrombotics/antiplatelets: Secondary | ICD-10-CM | POA: Diagnosis not present

## 2019-12-28 DIAGNOSIS — C9210 Chronic myeloid leukemia, BCR/ABL-positive, not having achieved remission: Secondary | ICD-10-CM | POA: Diagnosis not present

## 2020-01-08 ENCOUNTER — Other Ambulatory Visit: Payer: Self-pay | Admitting: Cardiovascular Disease

## 2020-01-08 ENCOUNTER — Telehealth (HOSPITAL_COMMUNITY): Payer: Self-pay

## 2020-01-08 NOTE — Telephone Encounter (Signed)

## 2020-01-09 ENCOUNTER — Encounter: Payer: Self-pay | Admitting: Vascular Surgery

## 2020-01-09 ENCOUNTER — Ambulatory Visit (INDEPENDENT_AMBULATORY_CARE_PROVIDER_SITE_OTHER): Payer: Self-pay | Admitting: Vascular Surgery

## 2020-01-09 ENCOUNTER — Other Ambulatory Visit: Payer: Self-pay

## 2020-01-09 VITALS — BP 142/64 | HR 94 | Temp 97.2°F | Resp 16 | Ht 68.0 in | Wt 185.0 lb

## 2020-01-09 DIAGNOSIS — I739 Peripheral vascular disease, unspecified: Secondary | ICD-10-CM

## 2020-01-09 MED ORDER — SULFAMETHOXAZOLE-TRIMETHOPRIM 800-160 MG PO TABS: 1.0000 | ORAL_TABLET | Freq: Two times a day (BID) | ORAL | 0 refills | Status: AC

## 2020-01-09 NOTE — Progress Notes (Signed)
Patient name: Sara Warner MRN: [id number] DOB: Sep 03, 1941 Sex: female  REASON FOR VISIT: Postop check after right leg bypass  HPI: [name] is a 79 y.o. female that presents for postop check after right common femoral to below knee pop bypass on 12/15/2019 with great saphenous vein for critical limb ischemia with tissue loss.  She also underwent a right external iliac angioplasty with stent using several Lutonix stents on 12/06/2019 prior to her bypass.  Prior to surgery she had an ABI 0.2 with a right great toe ulcer.  She states the rest pain is completely resolved in her right foot.  She feels the ulcer on her great toe is healing.  Concerned that her medial below-knee popliteal incision has been draining for the last week and some redness.  Past Medical History:  Diagnosis Date  . Atrial fibrillation ([place])   . Chronic atrial fibrillation ([place])    a. Refuses [place].  CHA2DS2VASc = 4.  . Chronic systolic CHF (congestive heart failure) ([place])    a. 05/2015 Echo: EF 35-40%.  . CKD (chronic kidney disease), stage III   . 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 DM (diabetes mellitus) ([place])   . Dysrhythmia   . Elevated WBC count   . Hypertension   . Hypertensive heart disease   . Hypothyroidism   . NICM (nonischemic cardiomyopathy) ([place])    a. 06/2013 MV: low risk study w/o ischemia;  b. 05/2015 Echo: EF 35-40%, diff HK, basal-midinferoseptal and basal-midanteroseptal AK. Mild-mod MR, mod dil LA, mild to mod TR, PASP 33mmHg.  Marland Kitchen PAD (peripheral artery disease) ([place])   . Pneumonia    30 yrs. ago  . Toxic goiter     Past Surgical History:  Procedure Laterality Date  . ABDOMINAL AORTOGRAM W/LOWER EXTREMITY Bilateral 12/06/2019   Procedure: ABDOMINAL AORTOGRAM W/LOWER EXTREMITY;  Surgeon: [name], MD;  Location: [facility];  Service: Cardiovascular;  Laterality: Bilateral;  . ABDOMINAL HYSTERECTOMY    . CARDIOVERSION    . CYST EXCISION     removed on  right ovary  . CYSTECTOMY    . FEMORAL ARTERY - POPLITEAL ARTERY BYPASS GRAFT  12/15/2019   Right common femoral artery to below-knee popliteal artery bypass with ipsilateral nonreversed great saphenous vein  . FEMORAL-POPLITEAL BYPASS GRAFT Right 12/15/2019   Procedure: BYPASS GRAFT FEMORAL-POPLITEAL ARTERY;  Surgeon: [name], MD;  Location: [facility];  Service: Vascular;  Laterality: Right;  . HYSTEROTOMY    . PERIPHERAL VASCULAR INTERVENTION Right 12/06/2019   Procedure: PERIPHERAL VASCULAR INTERVENTION;  Surgeon: [name], MD;  Location: [facility];  Service: Cardiovascular;  Laterality: Right;  EXT ILIAC    Family History  Problem Relation Age of Onset  . Cancer - Colon Mother        mast to lungs  . Diabetes Father   . Diabetes Paternal Grandmother     SOCIAL HISTORY: Social History   Tobacco Use  . Smoking status: Former Smoker    Types: Cigarettes    Quit date: 01/01/1984    Years since quitting: 36.0  . Smokeless tobacco: Never Used  Substance Use Topics  . Alcohol use: No    Allergies  Allergen Reactions  . Losartan Other (See Comments)    Patient reports nightmares/vivid dreams when taking.  . Lisinopril Cough    Current Outpatient Medications  Medication Sig Dispense Refill  . aspirin EC 81 MG tablet Take 1 tablet (81 mg total) by mouth  daily.    . atorvastatin (LIPITOR) 20 MG tablet Take 1 tablet (20 mg total) by mouth daily at 6 PM. 30 tablet 3  . clopidogrel (PLAVIX) 75 MG tablet Take 1 tablet (75 mg total) by mouth daily. 30 tablet 11  . clorazepate (TRANXENE-T) 7.5 MG tablet Take 1 tablet (7.5 mg total) by mouth 2 (two) times daily as needed for anxiety. 30 tablet 1  . digoxin (LANOXIN) 0.125 MG tablet Take 0.5 tablets (0.0625 mg total) by mouth every other day. 45 tablet 1  . furosemide (LASIX) 20 MG tablet TAKE 2 TABLETS BY MOUTH IN THE MORNING AND AN EXTRA TABLET IF GAIN 3 OR MORE POUNDS IN 1 DAY 180 tablet 1  . glipiZIDE  (GLUCOTROL XL) 10 MG 24 hr tablet Take 10 mg by mouth 2 (two) times daily.    . metoprolol (TOPROL-XL) 200 MG 24 hr tablet TAKE 1 TABLET BY MOUTH DAILY. (Patient taking differently: Take 200 mg by mouth daily. ) 90 tablet 2  . nilotinib (TASIGNA) 150 MG capsule Take 2 capsules (300 mg total) by mouth every 12 (twelve) hours. Take on an empty stomach, 1 hour before or 2 hours after meals. 120 capsule 2  . sitaGLIPtin (JANUVIA) 50 MG tablet Take 50 mg by mouth daily.     Marland Kitchen spironolactone (ALDACTONE) 25 MG tablet Take 0.5 tablets (12.5 mg total) by mouth 2 (two) times daily. (Patient taking differently: Take 12.5 mg by mouth every evening. ) 90 tablet 3  . SYNTHROID 137 MCG tablet Take 137 mcg by mouth daily before breakfast.    . TRULICITY 2.29 NL/8.9QJ SOPN Inject 0.75 mg into the skin every Sunday.     Marland Kitchen oxyCODONE-acetaminophen (PERCOCET) 5-325 MG tablet Take 1 tablet by mouth every 6 (six) hours as needed. (Patient not taking: Reported on 01/09/2020) 20 tablet 0   No current facility-administered medications for this visit.    REVIEW OF SYSTEMS:  [X]  denotes positive finding, [ ]  denotes negative finding Cardiac  Comments:  Chest pain or chest pressure:    Shortness of breath upon exertion:    Short of breath when lying flat:    Irregular heart rhythm:        Vascular    Pain in calf, thigh, or hip brought on by ambulation:    Pain in feet at night that wakes you up from your sleep:     Blood clot in your veins:    Leg swelling:         Pulmonary    Oxygen at home:    Productive cough:     Wheezing:         Neurologic    Sudden weakness in arms or legs:     Sudden numbness in arms or legs:     Sudden onset of difficulty speaking or slurred speech:    Temporary loss of vision in one eye:     Problems with dizziness:         Gastrointestinal    Blood in stool:     Vomited blood:         Genitourinary    Burning when urinating:     Blood in urine:        Psychiatric      Major depression:         Hematologic    Bleeding problems:    Problems with blood clotting too easily:        Skin    Rashes or ulcers:  Constitutional    Fever or chills:      PHYSICAL EXAM: Vitals:   01/09/20 1129  BP: (!) 142/64  Pulse: 94  Resp: 16  Temp: (!) 97.2 F (36.2 C)  TempSrc: Temporal  SpO2: 98%  Weight: 185 lb (83.9 kg)  Height: 5\' 8"  (1.727 m)    GENERAL: The patient is a well-nourished female, in no acute distress. The vital signs are documented above. CARDIAC: There is a regular rate and rhythm.  VASCULAR:  Right groin incision well-healed All of the right vein harvest incisions well-healed above the knee Right below-knee popliteal incision with erythema and drainage of old hematoma Right great toe dry ulcer improving  Palpable right DP pulse   DATA:   None  Assessment/Plan:  79 year old female status post right external iliac angioplasty and stent on 12/06/2019 followed by right common femoral to below-knee pop bypass with ipsilateral nonreversed saphenous vein on 12/15/2019 for critical limb ischemia with rest pain and tissue loss.  Her rest pain symptoms have all completely resolved and she no longer has rest pain.  She has an excellent dorsalis pedis pulse palpable in the right foot.  My only concern is she appears to have some infected hematoma at the below-knee popliteal incision.  I was able to open some of this incision in clinic where it was draining and drain a fair amount of old hematoma.  I will put her in a wet-to-dry dressing with home health.  I have started her on Bactrim double strength twice daily for 10 days.  I will see her back in 1 week for wound check discussed that if this worsens she needs to let us know immediately and she will likely require washout in the OR.   [name], MD [facility] Office: [phone]

## 2020-01-10 ENCOUNTER — Inpatient Hospital Stay (HOSPITAL_COMMUNITY)
Admission: EM | Admit: 2020-01-10 | Discharge: 2020-01-12 | DRG: 857 | Disposition: A | Discharge status: Home Health | Payer: PPO | Attending: Surgery | Admitting: Surgery

## 2020-01-10 ENCOUNTER — Emergency Department (HOSPITAL_COMMUNITY): Payer: PPO | Admitting: Certified Registered Nurse Anesthetist

## 2020-01-10 ENCOUNTER — Encounter (HOSPITAL_COMMUNITY): Payer: Self-pay | Admitting: Orthopedic Surgery

## 2020-01-10 ENCOUNTER — Encounter (HOSPITAL_COMMUNITY)
Admission: EM | Disposition: A | Discharge status: Home Health | Payer: Self-pay | Source: Home / Self Care | Attending: Surgery

## 2020-01-10 DIAGNOSIS — Z79891 Long term (current) use of opiate analgesic: Secondary | ICD-10-CM

## 2020-01-10 DIAGNOSIS — R531 Weakness: Secondary | ICD-10-CM | POA: Diagnosis not present

## 2020-01-10 DIAGNOSIS — Z856 Personal history of leukemia: Secondary | ICD-10-CM | POA: Diagnosis not present

## 2020-01-10 DIAGNOSIS — N183 Chronic kidney disease, stage 3 unspecified: Secondary | ICD-10-CM | POA: Diagnosis not present

## 2020-01-10 DIAGNOSIS — Z7982 Long term (current) use of aspirin: Secondary | ICD-10-CM | POA: Diagnosis not present

## 2020-01-10 DIAGNOSIS — E440 Moderate protein-calorie malnutrition: Secondary | ICD-10-CM | POA: Diagnosis present

## 2020-01-10 DIAGNOSIS — E039 Hypothyroidism, unspecified: Secondary | ICD-10-CM | POA: Diagnosis not present

## 2020-01-10 DIAGNOSIS — T82838A Hemorrhage of vascular prosthetic devices, implants and grafts, initial encounter: Secondary | ICD-10-CM | POA: Diagnosis not present

## 2020-01-10 DIAGNOSIS — T148XXA Other injury of unspecified body region, initial encounter: Secondary | ICD-10-CM | POA: Diagnosis present

## 2020-01-10 DIAGNOSIS — I130 Hypertensive heart and chronic kidney disease with heart failure and stage 1 through stage 4 chronic kidney disease, or unspecified chronic kidney disease: Secondary | ICD-10-CM | POA: Diagnosis not present

## 2020-01-10 DIAGNOSIS — L0291 Cutaneous abscess, unspecified: Secondary | ICD-10-CM | POA: Diagnosis not present

## 2020-01-10 DIAGNOSIS — T8141XA Infection following a procedure, superficial incisional surgical site, initial encounter: Secondary | ICD-10-CM | POA: Diagnosis not present

## 2020-01-10 DIAGNOSIS — I4891 Unspecified atrial fibrillation: Secondary | ICD-10-CM | POA: Diagnosis not present

## 2020-01-10 DIAGNOSIS — D62 Acute posthemorrhagic anemia: Secondary | ICD-10-CM | POA: Diagnosis not present

## 2020-01-10 DIAGNOSIS — I428 Other cardiomyopathies: Secondary | ICD-10-CM | POA: Diagnosis not present

## 2020-01-10 DIAGNOSIS — I482 Chronic atrial fibrillation, unspecified: Secondary | ICD-10-CM | POA: Diagnosis present

## 2020-01-10 DIAGNOSIS — Z79899 Other long term (current) drug therapy: Secondary | ICD-10-CM | POA: Diagnosis not present

## 2020-01-10 DIAGNOSIS — Z833 Family history of diabetes mellitus: Secondary | ICD-10-CM

## 2020-01-10 DIAGNOSIS — L7622 Postprocedural hemorrhage and hematoma of skin and subcutaneous tissue following other procedure: Secondary | ICD-10-CM | POA: Diagnosis not present

## 2020-01-10 DIAGNOSIS — T8189XA Other complications of procedures, not elsewhere classified, initial encounter: Secondary | ICD-10-CM | POA: Diagnosis not present

## 2020-01-10 DIAGNOSIS — T8579XA Infection and inflammatory reaction due to other internal prosthetic devices, implants and grafts, initial encounter: Secondary | ICD-10-CM | POA: Diagnosis present

## 2020-01-10 DIAGNOSIS — I5022 Chronic systolic (congestive) heart failure: Secondary | ICD-10-CM | POA: Diagnosis not present

## 2020-01-10 DIAGNOSIS — I739 Peripheral vascular disease, unspecified: Secondary | ICD-10-CM | POA: Diagnosis present

## 2020-01-10 DIAGNOSIS — Z03818 Encounter for observation for suspected exposure to other biological agents ruled out: Secondary | ICD-10-CM | POA: Diagnosis not present

## 2020-01-10 DIAGNOSIS — E1151 Type 2 diabetes mellitus with diabetic peripheral angiopathy without gangrene: Secondary | ICD-10-CM | POA: Diagnosis present

## 2020-01-10 DIAGNOSIS — Z20822 Contact with and (suspected) exposure to covid-19: Secondary | ICD-10-CM | POA: Diagnosis not present

## 2020-01-10 DIAGNOSIS — E1122 Type 2 diabetes mellitus with diabetic chronic kidney disease: Secondary | ICD-10-CM | POA: Diagnosis not present

## 2020-01-10 DIAGNOSIS — Z6826 Body mass index (BMI) 26.0-26.9, adult: Secondary | ICD-10-CM | POA: Diagnosis not present

## 2020-01-10 DIAGNOSIS — Z79890 Hormone replacement therapy (postmenopausal): Secondary | ICD-10-CM

## 2020-01-10 DIAGNOSIS — D638 Anemia in other chronic diseases classified elsewhere: Secondary | ICD-10-CM | POA: Diagnosis not present

## 2020-01-10 HISTORY — PX: WOUND EXPLORATION: SHX6188

## 2020-01-10 HISTORY — PX: APPLICATION OF WOUND VAC: SHX5189

## 2020-01-10 LAB — CBC
HCT: 23.3 % — ABNORMAL LOW (ref 36.0–46.0)
Hemoglobin: 7 g/dL — ABNORMAL LOW (ref 12.0–15.0)
MCH: 25.7 pg — ABNORMAL LOW (ref 26.0–34.0)
MCHC: 30 g/dL (ref 30.0–36.0)
MCV: 85.7 fL (ref 80.0–100.0)
Platelets: 636 10*3/uL — ABNORMAL HIGH (ref 150–400)
RBC: 2.72 MIL/uL — ABNORMAL LOW (ref 3.87–5.11)
RDW: 16.9 % — ABNORMAL HIGH (ref 11.5–15.5)
WBC: 25.8 10*3/uL — ABNORMAL HIGH (ref 4.0–10.5)
nRBC: 0.2 % (ref 0.0–0.2)

## 2020-01-10 LAB — BASIC METABOLIC PANEL
Anion gap: 13 (ref 5–15)
BUN: 26 mg/dL — ABNORMAL HIGH (ref 8–23)
CO2: 25 mmol/L (ref 22–32)
Calcium: 8.8 mg/dL — ABNORMAL LOW (ref 8.9–10.3)
Chloride: 100 mmol/L (ref 98–111)
Creatinine, Ser: 1.67 mg/dL — ABNORMAL HIGH (ref 0.44–1.00)
GFR calc Af Amer: 34 mL/min — ABNORMAL LOW (ref >60)
GFR calc non Af Amer: 29 mL/min — ABNORMAL LOW (ref >60)
Glucose, Bld: 152 mg/dL — ABNORMAL HIGH (ref 70–99)
Potassium: 3.8 mmol/L (ref 3.5–5.1)
Sodium: 138 mmol/L (ref 135–145)

## 2020-01-10 LAB — HEMOGLOBIN A1C
Hgb A1c MFr Bld: 5.4 % (ref 4.8–5.6)
Mean Plasma Glucose: 108.28 mg/dL

## 2020-01-10 LAB — RESPIRATORY PANEL BY RT PCR (FLU A&B, COVID)
Influenza A by PCR: NEGATIVE
Influenza B by PCR: NEGATIVE
SARS Coronavirus 2 by RT PCR: NEGATIVE

## 2020-01-10 LAB — GLUCOSE, CAPILLARY: Glucose-Capillary: 154 mg/dL — ABNORMAL HIGH (ref 70–99)

## 2020-01-10 SURGERY — WOUND EXPLORATION
Anesthesia: General | Laterality: Right

## 2020-01-10 MED ORDER — DULAGLUTIDE 0.75 MG/0.5ML ~~LOC~~ SOAJ: 0.7500 mg | SUBCUTANEOUS | Status: DC

## 2020-01-10 MED ORDER — OXYCODONE HCL 5 MG/5ML PO SOLN: 5.0000 mg | Freq: Once | ORAL | Status: DC | PRN

## 2020-01-10 MED ORDER — METOPROLOL TARTRATE 5 MG/5ML IV SOLN: 2.0000 mg | INTRAVENOUS | Status: DC | PRN

## 2020-01-10 MED ORDER — CEFAZOLIN SODIUM-DEXTROSE 2-4 GM/100ML-% IV SOLN
INTRAVENOUS | Status: AC
  Filled 2020-01-10: qty 100

## 2020-01-10 MED ORDER — 0.9 % SODIUM CHLORIDE (POUR BTL) OPTIME
TOPICAL | Status: DC | PRN
  Administered 2020-01-10: 20:00:00 2000 mL

## 2020-01-10 MED ORDER — SUCCINYLCHOLINE CHLORIDE 200 MG/10ML IV SOSY
PREFILLED_SYRINGE | INTRAVENOUS | Status: DC | PRN
  Administered 2020-01-10: 100 mg via INTRAVENOUS

## 2020-01-10 MED ORDER — LABETALOL HCL 5 MG/ML IV SOLN: 10.0000 mg | INTRAVENOUS | Status: DC | PRN

## 2020-01-10 MED ORDER — CEFAZOLIN SODIUM-DEXTROSE 2-3 GM-%(50ML) IV SOLR
INTRAVENOUS | Status: DC | PRN
  Administered 2020-01-10: 2 g via INTRAVENOUS

## 2020-01-10 MED ORDER — FENTANYL CITRATE (PF) 100 MCG/2ML IJ SOLN: 25.0000 ug | INTRAMUSCULAR | Status: DC | PRN

## 2020-01-10 MED ORDER — CLOPIDOGREL BISULFATE 75 MG PO TABS
75.0000 mg | ORAL_TABLET | Freq: Every day | ORAL | Status: DC
  Administered 2020-01-11 – 2020-01-12 (×2): 75 mg via ORAL
  Filled 2020-01-10 (×2): qty 1

## 2020-01-10 MED ORDER — FENTANYL CITRATE (PF) 250 MCG/5ML IJ SOLN
INTRAMUSCULAR | Status: DC | PRN
  Administered 2020-01-10: 75 ug via INTRAVENOUS

## 2020-01-10 MED ORDER — EPHEDRINE 5 MG/ML INJ
INTRAVENOUS | Status: AC
  Filled 2020-01-10: qty 10

## 2020-01-10 MED ORDER — HYDRALAZINE HCL 20 MG/ML IJ SOLN: 5.0000 mg | INTRAMUSCULAR | Status: DC | PRN

## 2020-01-10 MED ORDER — LIDOCAINE 2% (20 MG/ML) 5 ML SYRINGE
INTRAMUSCULAR | Status: DC | PRN
  Administered 2020-01-10: 100 mg via INTRAVENOUS

## 2020-01-10 MED ORDER — DOCUSATE SODIUM 100 MG PO CAPS
100.0000 mg | ORAL_CAPSULE | Freq: Every day | ORAL | Status: DC
  Administered 2020-01-11 – 2020-01-12 (×2): 100 mg via ORAL
  Filled 2020-01-10 (×2): qty 1

## 2020-01-10 MED ORDER — PHENYLEPHRINE 40 MCG/ML (10ML) SYRINGE FOR IV PUSH (FOR BLOOD PRESSURE SUPPORT)
PREFILLED_SYRINGE | INTRAVENOUS | Status: DC | PRN
  Administered 2020-01-10: 80 ug via INTRAVENOUS
  Administered 2020-01-10: 120 ug via INTRAVENOUS

## 2020-01-10 MED ORDER — PHENYLEPHRINE HCL-NACL 10-0.9 MG/250ML-% IV SOLN
INTRAVENOUS | Status: DC | PRN
  Administered 2020-01-10: 20 ug/min via INTRAVENOUS

## 2020-01-10 MED ORDER — ALBUMIN HUMAN 5 % IV SOLN: INTRAVENOUS | Status: DC | PRN

## 2020-01-10 MED ORDER — GLIPIZIDE ER 10 MG PO TB24
10.0000 mg | ORAL_TABLET | Freq: Two times a day (BID) | ORAL | Status: DC
  Administered 2020-01-11 – 2020-01-12 (×3): 10 mg via ORAL
  Filled 2020-01-10 (×5): qty 1

## 2020-01-10 MED ORDER — SODIUM CHLORIDE 0.9 % IV SOLN: INTRAVENOUS | Status: DC

## 2020-01-10 MED ORDER — LEVOTHYROXINE SODIUM 25 MCG PO TABS
137.0000 ug | ORAL_TABLET | Freq: Every day | ORAL | Status: DC
  Administered 2020-01-11 – 2020-01-12 (×2): 137 ug via ORAL
  Filled 2020-01-10 (×2): qty 1

## 2020-01-10 MED ORDER — LIDOCAINE 2% (20 MG/ML) 5 ML SYRINGE
INTRAMUSCULAR | Status: AC
  Filled 2020-01-10: qty 5

## 2020-01-10 MED ORDER — BISACODYL 5 MG PO TBEC: 5.0000 mg | DELAYED_RELEASE_TABLET | Freq: Every day | ORAL | Status: DC | PRN

## 2020-01-10 MED ORDER — NILOTINIB HCL 50 MG PO CAPS
100.0000 mg | ORAL_CAPSULE | Freq: Two times a day (BID) | ORAL | Status: DC
  Administered 2020-01-11: 12:00:00 100 mg via ORAL

## 2020-01-10 MED ORDER — DEXAMETHASONE SODIUM PHOSPHATE 10 MG/ML IJ SOLN
INTRAMUSCULAR | Status: DC | PRN
  Administered 2020-01-10: 5 mg via INTRAVENOUS

## 2020-01-10 MED ORDER — SPIRONOLACTONE 12.5 MG HALF TABLET
12.5000 mg | ORAL_TABLET | Freq: Every evening | ORAL | Status: DC
  Administered 2020-01-11 – 2020-01-12 (×2): 12.5 mg via ORAL
  Filled 2020-01-10 (×2): qty 1

## 2020-01-10 MED ORDER — DIGOXIN 125 MCG PO TABS
0.0625 mg | ORAL_TABLET | ORAL | Status: DC
  Administered 2020-01-12: 09:00:00 0.0625 mg via ORAL
  Filled 2020-01-10: qty 1

## 2020-01-10 MED ORDER — ONDANSETRON HCL 4 MG/2ML IJ SOLN
INTRAMUSCULAR | Status: AC
  Filled 2020-01-10: qty 2

## 2020-01-10 MED ORDER — ROCURONIUM BROMIDE 10 MG/ML (PF) SYRINGE
PREFILLED_SYRINGE | INTRAVENOUS | Status: AC
  Filled 2020-01-10: qty 10

## 2020-01-10 MED ORDER — GUAIFENESIN-DM 100-10 MG/5ML PO SYRP: 15.0000 mL | ORAL_SOLUTION | ORAL | Status: DC | PRN

## 2020-01-10 MED ORDER — ATORVASTATIN CALCIUM 10 MG PO TABS
20.0000 mg | ORAL_TABLET | Freq: Every day | ORAL | Status: DC
  Filled 2020-01-10: qty 2

## 2020-01-10 MED ORDER — MAGNESIUM SULFATE 2 GM/50ML IV SOLN: 2.0000 g | Freq: Every day | INTRAVENOUS | Status: DC | PRN

## 2020-01-10 MED ORDER — METOPROLOL SUCCINATE ER 100 MG PO TB24
200.0000 mg | ORAL_TABLET | Freq: Every day | ORAL | Status: DC
  Administered 2020-01-11 – 2020-01-12 (×2): 200 mg via ORAL
  Filled 2020-01-10 (×2): qty 2

## 2020-01-10 MED ORDER — PHENYLEPHRINE 40 MCG/ML (10ML) SYRINGE FOR IV PUSH (FOR BLOOD PRESSURE SUPPORT)
PREFILLED_SYRINGE | INTRAVENOUS | Status: AC
  Filled 2020-01-10: qty 10

## 2020-01-10 MED ORDER — SODIUM CHLORIDE 0.9 % IV SOLN
INTRAVENOUS | Status: DC | PRN
  Administered 2020-01-10: 20:00:00 500 mL

## 2020-01-10 MED ORDER — FENTANYL CITRATE (PF) 250 MCG/5ML IJ SOLN
INTRAMUSCULAR | Status: AC
  Filled 2020-01-10: qty 5

## 2020-01-10 MED ORDER — HEPARIN SODIUM (PORCINE) 5000 UNIT/ML IJ SOLN
5000.0000 [IU] | Freq: Three times a day (TID) | INTRAMUSCULAR | Status: DC
  Administered 2020-01-11 – 2020-01-12 (×4): 5000 [IU] via SUBCUTANEOUS
  Filled 2020-01-10 (×4): qty 1

## 2020-01-10 MED ORDER — SUCCINYLCHOLINE CHLORIDE 200 MG/10ML IV SOSY
PREFILLED_SYRINGE | INTRAVENOUS | Status: AC
  Filled 2020-01-10: qty 10

## 2020-01-10 MED ORDER — DEXAMETHASONE SODIUM PHOSPHATE 10 MG/ML IJ SOLN
INTRAMUSCULAR | Status: AC
  Filled 2020-01-10: qty 2

## 2020-01-10 MED ORDER — ONDANSETRON HCL 4 MG/2ML IJ SOLN: 4.0000 mg | Freq: Four times a day (QID) | INTRAMUSCULAR | Status: DC | PRN

## 2020-01-10 MED ORDER — FUROSEMIDE 20 MG PO TABS
20.0000 mg | ORAL_TABLET | Freq: Two times a day (BID) | ORAL | Status: DC
  Administered 2020-01-11 – 2020-01-12 (×3): 20 mg via ORAL
  Filled 2020-01-10 (×3): qty 1

## 2020-01-10 MED ORDER — CEFAZOLIN SODIUM-DEXTROSE 2-4 GM/100ML-% IV SOLN
2.0000 g | Freq: Three times a day (TID) | INTRAVENOUS | Status: AC
  Administered 2020-01-11 (×2): 2 g via INTRAVENOUS
  Filled 2020-01-10 (×2): qty 100

## 2020-01-10 MED ORDER — SODIUM CHLORIDE 0.9 % IV SOLN: 500.0000 mL | Freq: Once | INTRAVENOUS | Status: DC | PRN

## 2020-01-10 MED ORDER — INSULIN ASPART 100 UNIT/ML ~~LOC~~ SOLN
0.0000 [IU] | Freq: Three times a day (TID) | SUBCUTANEOUS | Status: DC
  Administered 2020-01-11: 12:00:00 2 [IU] via SUBCUTANEOUS
  Administered 2020-01-11: 1 [IU] via SUBCUTANEOUS
  Administered 2020-01-11: 2 [IU] via SUBCUTANEOUS

## 2020-01-10 MED ORDER — PROPOFOL 10 MG/ML IV BOLUS
INTRAVENOUS | Status: AC
  Filled 2020-01-10: qty 20

## 2020-01-10 MED ORDER — SODIUM CHLORIDE 0.9 % IV SOLN
INTRAVENOUS | Status: AC
  Filled 2020-01-10: qty 1.2

## 2020-01-10 MED ORDER — PHENOL 1.4 % MT LIQD: 1.0000 | OROMUCOSAL | Status: DC | PRN

## 2020-01-10 MED ORDER — OXYCODONE HCL 5 MG PO TABS: 5.0000 mg | ORAL_TABLET | Freq: Once | ORAL | Status: DC | PRN

## 2020-01-10 MED ORDER — HYDROMORPHONE HCL 1 MG/ML IJ SOLN: 0.5000 mg | INTRAMUSCULAR | Status: DC | PRN

## 2020-01-10 MED ORDER — SULFAMETHOXAZOLE-TRIMETHOPRIM 800-160 MG PO TABS
1.0000 | ORAL_TABLET | Freq: Two times a day (BID) | ORAL | Status: DC
  Administered 2020-01-10 – 2020-01-12 (×4): 1 via ORAL
  Filled 2020-01-10 (×4): qty 1

## 2020-01-10 MED ORDER — ASPIRIN EC 81 MG PO TBEC
81.0000 mg | DELAYED_RELEASE_TABLET | Freq: Every day | ORAL | Status: DC
  Administered 2020-01-11 – 2020-01-12 (×2): 81 mg via ORAL
  Filled 2020-01-10 (×2): qty 1

## 2020-01-10 MED ORDER — SENNOSIDES-DOCUSATE SODIUM 8.6-50 MG PO TABS: 1.0000 | ORAL_TABLET | Freq: Every evening | ORAL | Status: DC | PRN

## 2020-01-10 MED ORDER — LACTATED RINGERS IV SOLN: INTRAVENOUS | Status: DC

## 2020-01-10 MED ORDER — POTASSIUM CHLORIDE CRYS ER 20 MEQ PO TBCR: 20.0000 meq | EXTENDED_RELEASE_TABLET | Freq: Every day | ORAL | Status: DC | PRN

## 2020-01-10 MED ORDER — ONDANSETRON HCL 4 MG/2ML IJ SOLN
INTRAMUSCULAR | Status: DC | PRN
  Administered 2020-01-10: 4 mg via INTRAVENOUS

## 2020-01-10 MED ORDER — ACETAMINOPHEN 325 MG RE SUPP: 325.0000 mg | RECTAL | Status: DC | PRN

## 2020-01-10 MED ORDER — ALUM & MAG HYDROXIDE-SIMETH 200-200-20 MG/5ML PO SUSP: 15.0000 mL | ORAL | Status: DC | PRN

## 2020-01-10 MED ORDER — ACETAMINOPHEN 325 MG PO TABS: 325.0000 mg | ORAL_TABLET | ORAL | Status: DC | PRN

## 2020-01-10 MED ORDER — PANTOPRAZOLE SODIUM 40 MG PO TBEC
40.0000 mg | DELAYED_RELEASE_TABLET | Freq: Every day | ORAL | Status: DC
  Administered 2020-01-11 – 2020-01-12 (×2): 40 mg via ORAL
  Filled 2020-01-10 (×2): qty 1

## 2020-01-10 MED ORDER — OXYCODONE HCL 5 MG PO TABS: 5.0000 mg | ORAL_TABLET | ORAL | Status: DC | PRN

## 2020-01-10 SURGICAL SUPPLY — 28 items
CANISTER SUCT 3000ML PPV (MISCELLANEOUS) ×2 IMPLANT
CLIP VESOCCLUDE MED 24/CT (CLIP) ×2 IMPLANT
CLIP VESOCCLUDE SM WIDE 24/CT (CLIP) ×2 IMPLANT
DERMABOND ADVANCED (GAUZE/BANDAGES/DRESSINGS) ×1
DERMABOND ADVANCED .7 DNX12 (GAUZE/BANDAGES/DRESSINGS) ×1 IMPLANT
DRSG VAC ATS SM SENSATRAC (GAUZE/BANDAGES/DRESSINGS) ×2 IMPLANT
GLOVE BIOGEL PI IND STRL 7.5 (GLOVE) ×1 IMPLANT
GLOVE BIOGEL PI INDICATOR 7.5 (GLOVE) ×1
GLOVE INDICATOR 7.5 STRL GRN (GLOVE) ×2 IMPLANT
GOWN STRL REUS W/ TWL LRG LVL3 (GOWN DISPOSABLE) ×2 IMPLANT
GOWN STRL REUS W/ TWL XL LVL3 (GOWN DISPOSABLE) ×2 IMPLANT
GOWN STRL REUS W/TWL LRG LVL3 (GOWN DISPOSABLE) ×2
GOWN STRL REUS W/TWL XL LVL3 (GOWN DISPOSABLE) ×2
KIT BASIN OR (CUSTOM PROCEDURE TRAY) ×2 IMPLANT
KIT TURNOVER KIT B (KITS) ×2 IMPLANT
NS IRRIG 1000ML POUR BTL (IV SOLUTION) ×2 IMPLANT
PACK PERIPHERAL VASCULAR (CUSTOM PROCEDURE TRAY) ×2 IMPLANT
PAD ARMBOARD 7.5X6 YLW CONV (MISCELLANEOUS) ×2 IMPLANT
SUT PROLENE 5 0 C 1 24 (SUTURE) ×2 IMPLANT
SUT PROLENE 6 0 BV (SUTURE) ×2 IMPLANT
SUT VIC AB 2-0 CT1 27 (SUTURE) ×1
SUT VIC AB 2-0 CT1 TAPERPNT 27 (SUTURE) ×1 IMPLANT
SUT VIC AB 3-0 SH 27 (SUTURE) ×1
SUT VIC AB 3-0 SH 27X BRD (SUTURE) ×1 IMPLANT
SUT VICRYL 4-0 PS2 18IN ABS (SUTURE) ×2 IMPLANT
SWAB COLLECTION DEVICE MRSA (MISCELLANEOUS) ×2 IMPLANT
TOWEL GREEN STERILE (TOWEL DISPOSABLE) ×2 IMPLANT
WATER STERILE IRR 1000ML POUR (IV SOLUTION) ×2 IMPLANT

## 2020-01-10 NOTE — Anesthesia Preprocedure Evaluation (Signed)
Anesthesia Evaluation  Patient identified by MRN, date of birth, ID band Patient awake    Reviewed: Allergy & Precautions, H&P , NPO status , Patient's Chart, lab work & pertinent test results  Airway Mallampati: II   Neck ROM: full    Dental   Pulmonary former smoker,    breath sounds clear to auscultation       Cardiovascular hypertension, + Peripheral Vascular Disease and +CHF  + dysrhythmias Atrial Fibrillation  Rhythm:irregular Rate:Normal  EF 35-40% S/p Fem-pop 12/15/19   Neuro/Psych    GI/Hepatic   Endo/Other  diabetes, Type 2Hypothyroidism Hyperthyroidism   Renal/GU Renal InsufficiencyRenal disease     Musculoskeletal   Abdominal   Peds  Hematology  (+) Blood dyscrasia, anemia , H/o CML   Anesthesia Other Findings   Reproductive/Obstetrics                             Anesthesia Physical Anesthesia Plan  ASA: III  Anesthesia Plan: General   Post-op Pain Management:    Induction: Intravenous  PONV Risk Score and Plan: 2 and Ondansetron, Dexamethasone and Treatment may vary due to age or medical condition  Airway Management Planned: Oral ETT  Additional Equipment:   Intra-op Plan:   Post-operative Plan: Extubation in OR  Informed Consent: I have reviewed the patients History and Physical, chart, labs and discussed the procedure including the risks, benefits and alternatives for the proposed anesthesia with the patient or authorized representative who has indicated his/her understanding and acceptance.       Plan Discussed with: CRNA, Anesthesiologist and Surgeon  Anesthesia Plan Comments:         Anesthesia Quick Evaluation

## 2020-01-10 NOTE — Op Note (Signed)
    Patient name: Sara Warner MRN: [id number] DOB: 05-02-1941 Sex: female  01/10/2020 Pre-operative Diagnosis: Bleeding from recent right femoral-popliteal bypass graft Post-operative diagnosis:  Same Surgeon:  Annamarie Major Assistants: Laurence Slate Procedure:   #1: Exploration of recent right femoral-popliteal bypass graft   #2: Ligation of bleeding venous branch   #3: Application of wound VAC (14 x 2 x 2 cm) Anesthesia: General Blood Loss: Minimal Specimens: Anaerobic and aerobic cultures were sent  Findings: There was a pulsatile venous branch at the distal aspect of the wound that was felt to be the source of bleeding.  The patient had diffuse oozing from the incision once it was reopened.  This was likely secondary to Plavix.  I did encounter a stitch abscess which had purulent material within it.  This was sent for culture.  The bypass distal anastomosis was well incorporated and so I did not explore the anastomosis.  Indications: The patient is recently status post right femoral-popliteal bypass graft with vein for tissue loss.  She was seen in the office yesterday and the distal incision was opened.  She had bleeding with a dressing change today at home and was transported to the emergency department via EMS.  She had a significant amount of blood on her dressing which had been changed several times.  I felt exploration was warranted because of the amount of bleeding present.  Procedure:  The patient was identified in the holding area and taken to [place] [address]  The patient was then placed supine on the table. general anesthesia was administered.  The patient was prepped and draped in the usual sterile fashion.  A time out was called and antibiotics were administered.  A tourniquet was placed on the upper thigh for precautionary measures.  This was never inflated.  The previous below-knee incision which had been partially opened was opened throughout its length with a 10 blade.  Upon  opening the wound, at the upper aspect, there was purulent material consistent with a stitch abscess.  The fluid was sent for culture.  This was a very localized process.  There did appear to be some bleeding from the distal aspect of the incision.  When I explored this, there was a 1 mm vein branch which was pressurized, which likely represented the source of bleeding.  This was controlled with cautery.  The wound was then irrigated.  There was diffuse oozing from the raw surface areas likely from her Plavix.  I did not explore the distal anastomosis as this area was well incorporated.  Hemostasis was achieved with cautery.  I then placed a wound VAC within the incision.  The wound measured 14 x 2 x 2 cm.  The wound VAC was connected to suction.  There was a good seal.  The patient tolerated procedure well.  She was successfully extubated and taken recovery in stable condition.   Disposition: To PACU stable.   Theotis Burrow, M.D., [facility] Office: [phone] Pager:  [phone]

## 2020-01-10 NOTE — Transfer of Care (Signed)
Immediate Anesthesia Transfer of Care Note  Patient: Sara Warner  Procedure(s) Performed: right lower leg Wound Exploration with wound vac application (Right )  Patient Location: PACU  Anesthesia Type:General  Level of Consciousness: awake, alert , oriented and patient cooperative  Airway & Oxygen Therapy: Patient Spontanous Breathing and Patient connected to nasal cannula oxygen  Post-op Assessment: Report given to RN and Post -op Vital signs reviewed and stable  Post vital signs: Reviewed and stable  Last Vitals:  Vitals Value Taken Time  BP    Temp    Pulse    Resp    SpO2      Last Pain:  Vitals:   01/10/20 1722  TempSrc: Oral         Complications: No apparent anesthesia complications

## 2020-01-10 NOTE — H&P (Signed)
[facility]  Patient name: Sara Warner MRN: [id number] DOB: 01-26-1941 Sex: female   REQUESTING PROVIDER:    ER   REASON FOR CONSULT:    Bleeding from bypass  HISTORY OF PRESENT ILLNESS:   Sara Warner is a 79 y.o. female that presents for postop check after right common femoral to below knee pop bypass on 12/15/2019 with great saphenous vein for critical limb ischemia with tissue loss.  She also underwent a right external iliac angioplasty with stent using several Lutonix stents on 12/06/2019 prior to her bypass.  Prior to surgery she had an ABI 0.2 with a right great toe ulcer.  She states the rest pain is completely resolved in her right foot.  She feels the ulcer on her great toe is healing.  She was seen by Dr. Carlis Abbott opened her wound yesterday.  She had significant bleeding at home today with a dressing change and was brought to the ED by EMS  PAST MEDICAL HISTORY    Past Medical History:  Diagnosis Date  . Atrial fibrillation ([place])   . Chronic atrial fibrillation ([place])    a. Refuses [place].  CHA2DS2VASc = 4.  . Chronic systolic CHF (congestive heart failure) ([place])    a. 05/2015 Echo: EF 35-40%.  . CKD (chronic kidney disease), stage III   . 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 DM (diabetes mellitus) ([place])   . Dysrhythmia   . Elevated WBC count   . Hypertension   . Hypertensive heart disease   . Hypothyroidism   . NICM (nonischemic cardiomyopathy) ([place])    a. 06/2013 MV: low risk study w/o ischemia;  b. 05/2015 Echo: EF 35-40%, diff HK, basal-midinferoseptal and basal-midanteroseptal AK. Mild-mod MR, mod dil LA, mild to mod TR, PASP 60mmHg.  Marland Kitchen PAD (peripheral artery disease) ([place])   . Pneumonia    30 yrs. ago  . Toxic goiter      FAMILY HISTORY   Family History  Problem Relation Age of Onset  . Cancer - Colon Mother        mast to lungs  . Diabetes Father   . Diabetes Paternal  Grandmother     SOCIAL HISTORY:   Social History   Socioeconomic History  . Marital status: Single    Spouse name: Not on file  . Number of children: Not on file  . Years of education: Not on file  . Highest education level: Not on file  Occupational History  . Not on file  Tobacco Use  . Smoking status: Former Smoker    Types: Cigarettes    Quit date: 01/01/1984    Years since quitting: 36.0  . Smokeless tobacco: Never Used  Substance and Sexual Activity  . Alcohol use: No  . Drug use: Never  . Sexual activity: Not on file  Other Topics Concern  . Not on file  Social History Narrative  . Not on file   Social Determinants of Health   Financial Resource Strain:   . Difficulty of Paying Living Expenses: Not on file  Food Insecurity:   . Worried About Charity fundraiser in the Last Year: Not on file  . Ran Out of Food in the Last Year: Not on file  Transportation Needs:   . Lack of Transportation (Medical): Not on file  . Lack of Transportation (Non-Medical): Not on file  Physical Activity:   . Days of Exercise per Week: Not on file  . Minutes of  Exercise per Session: Not on file  Stress:   . Feeling of Stress : Not on file  Social Connections:   . Frequency of Communication with Friends and Family: Not on file  . Frequency of Social Gatherings with Friends and Family: Not on file  . Attends Religious Services: Not on file  . Active Member of Clubs or Organizations: Not on file  . Attends Archivist Meetings: Not on file  . Marital Status: Not on file  Intimate Partner Violence:   . Fear of Current or Ex-Partner: Not on file  . Emotionally Abused: Not on file  . Physically Abused: Not on file  . Sexually Abused: Not on file    ALLERGIES:    Allergies  Allergen Reactions  . Losartan Other (See Comments)    Patient reports nightmares/vivid dreams when taking.  . Lisinopril Cough    CURRENT MEDICATIONS:    No current facility-administered  medications for this encounter.   Current Outpatient Medications  Medication Sig Dispense Refill  . aspirin EC 81 MG tablet Take 1 tablet (81 mg total) by mouth daily.    Marland Kitchen atorvastatin (LIPITOR) 20 MG tablet Take 1 tablet (20 mg total) by mouth daily at 6 PM. 30 tablet 3  . clopidogrel (PLAVIX) 75 MG tablet Take 1 tablet (75 mg total) by mouth daily. 30 tablet 11  . clorazepate (TRANXENE-T) 7.5 MG tablet Take 1 tablet (7.5 mg total) by mouth 2 (two) times daily as needed for anxiety. 30 tablet 1  . digoxin (LANOXIN) 0.125 MG tablet Take 0.5 tablets (0.0625 mg total) by mouth every other day. 45 tablet 1  . furosemide (LASIX) 20 MG tablet TAKE 2 TABLETS BY MOUTH IN THE MORNING AND AN EXTRA TABLET IF GAIN 3 OR MORE POUNDS IN 1 DAY 180 tablet 1  . glipiZIDE (GLUCOTROL XL) 10 MG 24 hr tablet Take 10 mg by mouth 2 (two) times daily.    . metoprolol (TOPROL-XL) 200 MG 24 hr tablet TAKE 1 TABLET BY MOUTH DAILY. (Patient taking differently: Take 200 mg by mouth daily. ) 90 tablet 2  . nilotinib (TASIGNA) 150 MG capsule Take 2 capsules (300 mg total) by mouth every 12 (twelve) hours. Take on an empty stomach, 1 hour before or 2 hours after meals. 120 capsule 2  . oxyCODONE-acetaminophen (PERCOCET) 5-325 MG tablet Take 1 tablet by mouth every 6 (six) hours as needed. (Patient not taking: Reported on 01/09/2020) 20 tablet 0  . sitaGLIPtin (JANUVIA) 50 MG tablet Take 50 mg by mouth daily.     Marland Kitchen spironolactone (ALDACTONE) 25 MG tablet Take 0.5 tablets (12.5 mg total) by mouth 2 (two) times daily. (Patient taking differently: Take 12.5 mg by mouth every evening. ) 90 tablet 3  . sulfamethoxazole-trimethoprim (BACTRIM DS) 800-160 MG tablet Take 1 tablet by mouth 2 (two) times daily for 10 days. 20 tablet 0  . SYNTHROID 137 MCG tablet Take 137 mcg by mouth daily before breakfast.    . TRULICITY 6.23 JS/2.8BT SOPN Inject 0.75 mg into the skin every Sunday.       REVIEW OF SYSTEMS:   [X]  denotes positive  finding, [ ]  denotes negative finding Cardiac  Comments:  Chest pain or chest pressure:    Shortness of breath upon exertion:    Short of breath when lying flat:    Irregular heart rhythm:        Vascular    Pain in calf, thigh, or hip brought on by ambulation:  Pain in feet at night that wakes you up from your sleep:     Blood clot in your veins:    Leg swelling:         Pulmonary    Oxygen at home:    Productive cough:     Wheezing:         Neurologic    Sudden weakness in arms or legs:     Sudden numbness in arms or legs:     Sudden onset of difficulty speaking or slurred speech:    Temporary loss of vision in one eye:     Problems with dizziness:         Gastrointestinal    Blood in stool:      Vomited blood:         Genitourinary    Burning when urinating:     Blood in urine:        Psychiatric    Major depression:         Hematologic    Bleeding problems:    Problems with blood clotting too easily:        Skin    Rashes or ulcers:        Constitutional    Fever or chills:     PHYSICAL EXAM:   Vitals:   01/10/20 1722  BP: 110/79  Pulse: 87  Resp: 18  Temp: 98.1 F (36.7 C)  TempSrc: Oral  SpO2: 96%    GENERAL: The patient is a well-nourished female, in no acute distress. The vital signs are documented above. CARDIAC: There is a regular rate and rhythm.  VASCULAR: palpable right DP.  Open wound in bk incision with hematoma and significant bleeding on her dressing PULMONARY: Nonlabored respirations ABDOMEN: Soft and non-tender with normal pitched bowel sounds.  MUSCULOSKELETAL: There are no major deformities or cyanosis. NEUROLOGIC: No focal weakness or paresthesias are detected. SKIN: There are no ulcers or rashes noted. PSYCHIATRIC: The patient has a normal affect.  STUDIES:   none  ASSESSMENT and PLAN   I discussed that I am concerned about the amount of blood on her dressing as well as the report from EMS.  I feel she needs to goto  the OR for exploration.  She is in agreement of this   Annamarie Major, IV, MD, FACS [facility] [phone] Pager [phone]

## 2020-01-10 NOTE — ED Triage Notes (Signed)
Pt brought in by EMS from home for RLE bleeding. Pt recently had surgery on her right leg, seen by physician yesterday who stated pt did not have an infection and everything was going well. At 12 this afternoon pt had home health nurse come out and look at her site which had started to bleed "significantly". Per EMS bleeding was controlled until arrival to ED. Pt A+Ox4, in NAD on arrival.

## 2020-01-10 NOTE — Anesthesia Procedure Notes (Signed)
Procedure Name: Intubation Date/Time: 01/10/2020 8:09 PM Performed by: Shirlyn Goltz, CRNA Pre-anesthesia Checklist: Patient identified, Emergency Drugs available, Suction available and Patient being monitored Patient Re-evaluated:Patient Re-evaluated prior to induction Oxygen Delivery Method: Circle system utilized Preoxygenation: Pre-oxygenation with 100% oxygen Induction Type: IV induction, Rapid sequence and Cricoid Pressure applied Laryngoscope Size: Mac and 3 Grade View: Grade I Tube type: Oral Tube size: 7.0 mm Number of attempts: 1 Airway Equipment and Method: Stylet Placement Confirmation: ETT inserted through vocal cords under direct vision,  positive ETCO2 and breath sounds checked- equal and bilateral Secured at: 21 cm Tube secured with: Tape Dental Injury: Teeth and Oropharynx as per pre-operative assessment

## 2020-01-10 NOTE — ED Notes (Signed)
[place] (daughter) [phone] Verdell Face (daughter)

## 2020-01-10 NOTE — Progress Notes (Signed)
Spoke with Gerri Lins PA and clarified that wound vac does not need instillation.  Currently VSS, and patient resting comfortably.  Will continue to monitor.

## 2020-01-10 NOTE — ED Provider Notes (Signed)
[facility] EMERGENCY DEPARTMENT Provider Note   CSN: [id number] Arrival date & time: 01/10/20  1722     History Chief Complaint  Patient presents with  . Post-op Problem    Sara Warner is a 79 y.o. female.  Patient brought in from bleeding from postoperative wound of the right leg.  Patient was seen yesterday by vascular surgery.  For a postop check of right leg bypass surgery that occurred on January 15.  It was a right common femoral to below-knee pop bypass.  With greater saphenous vein for critical limb ischemia with tissue loss.  They noted that there was evidence of a wound infection in the leg wound..  Apparently there was evidence of purulent discharge.  Patient had the wound open had it packed apparently there was a lot of bleeding that occurred today.  When the packing was changed.  Patient was told to come in immediately by vascular surgery and that the on-call vascular surgeon would see patient here.  Bleeding currently controlled with Kerlix gauze.  Still has palpable pulses of the right foot but 1+.  Vascular surgery has seen the patient and is making arrangements to take her to the OR.  Seen by Dr. Trula Slade.        Past Medical History:  Diagnosis Date  . Atrial fibrillation ([place])   . Chronic atrial fibrillation ([place])    a. Refuses [place].  CHA2DS2VASc = 4.  . Chronic systolic CHF (congestive heart failure) ([place])    a. 05/2015 Echo: EF 35-40%.  . CKD (chronic kidney disease), stage III   . 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 DM (diabetes mellitus) ([place])   . Dysrhythmia   . Elevated WBC count   . Hypertension   . Hypertensive heart disease   . Hypothyroidism   . NICM (nonischemic cardiomyopathy) ([place])    a. 06/2013 MV: low risk study w/o ischemia;  b. 05/2015 Echo: EF 35-40%, diff HK, basal-midinferoseptal and basal-midanteroseptal AK. Mild-mod MR, mod dil LA, mild to mod TR, PASP 65mmHg.  Marland Kitchen PAD (peripheral artery disease) ([place])     . Pneumonia    30 yrs. ago  . Toxic goiter     Patient Active Problem List   Diagnosis Date Noted  . PAD (peripheral artery disease) ([place]) 12/15/2019  . Critical lower limb ischemia 11/28/2019  . Bilateral impacted cerumen 06/23/2017  . 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 NICM (nonischemic cardiomyopathy) ([place])   . Chronic atrial fibrillation ([place])   . Hypertensive heart disease   . Stage III chronic kidney disease 10/02/2015  . Controlled type 2 diabetes mellitus without complication ([place]) 57/84/6962  . 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 Atrial fibrillation ([place]) 01/05/2012  . Hypothyroid 01/05/2012  . Cardiomyopathy ([place]) 01/05/2012    Past Surgical History:  Procedure Laterality Date  . ABDOMINAL AORTOGRAM W/LOWER EXTREMITY Bilateral 12/06/2019   Procedure: ABDOMINAL AORTOGRAM W/LOWER EXTREMITY;  Surgeon: [name], MD;  Location: [facility];  Service: Cardiovascular;  Laterality: Bilateral;  . ABDOMINAL HYSTERECTOMY    . CARDIOVERSION    . CYST EXCISION     removed on right ovary  . CYSTECTOMY    . FEMORAL ARTERY - POPLITEAL ARTERY BYPASS GRAFT  12/15/2019   Right common femoral artery to below-knee popliteal artery bypass with ipsilateral nonreversed great saphenous vein  . FEMORAL-POPLITEAL BYPASS GRAFT Right 12/15/2019   Procedure: BYPASS GRAFT FEMORAL-POPLITEAL ARTERY;  Surgeon: [name], MD;  Location: [facility];  Service: Vascular;  Laterality: Right;  . HYSTEROTOMY    . PERIPHERAL VASCULAR INTERVENTION Right 12/06/2019   Procedure: PERIPHERAL VASCULAR INTERVENTION;  Surgeon: [name], MD;  Location: [facility];  Service: Cardiovascular;  Laterality: Right;  EXT ILIAC     OB History   No obstetric history on file.     Family History  Problem Relation Age of Onset  . Cancer - Colon Mother        mast to lungs  . Diabetes Father    . Diabetes Paternal Grandmother     Social History   Tobacco Use  . Smoking status: Former Smoker    Types: Cigarettes    Quit date: 01/01/1984    Years since quitting: 36.0  . Smokeless tobacco: Never Used  Substance Use Topics  . Alcohol use: No  . Drug use: Never    Home Medications Prior to Admission medications   Medication Sig Start Date End Date Taking? Authorizing Provider  aspirin EC 81 MG tablet Take 1 tablet (81 mg total) by mouth daily. 12/17/19   Rhyne, Hulen Shouts, PA-C  atorvastatin (LIPITOR) 20 MG tablet Take 1 tablet (20 mg total) by mouth daily at 6 PM. 12/17/19   Rhyne, Hulen Shouts, PA-C  clopidogrel (PLAVIX) 75 MG tablet Take 1 tablet (75 mg total) by mouth daily. 12/06/19 12/05/20  [name], MD  clorazepate (TRANXENE-T) 7.5 MG tablet Take 1 tablet (7.5 mg total) by mouth 2 (two) times daily as needed for anxiety. 01/10/13   Maryanna Shape, NP  digoxin (LANOXIN) 0.125 MG tablet Take 0.5 tablets (0.0625 mg total) by mouth every other day. 10/30/19   Troy Sine, MD  furosemide (LASIX) 20 MG tablet TAKE 2 TABLETS BY MOUTH IN THE MORNING AND AN EXTRA TABLET IF GAIN 3 OR MORE POUNDS IN 1 DAY 01/09/20   Troy Sine, MD  glipiZIDE (GLUCOTROL XL) 10 MG 24 hr tablet Take 10 mg by mouth 2 (two) times daily. 07/07/14   [provider]  metoprolol (TOPROL-XL) 200 MG 24 hr tablet TAKE 1 TABLET BY MOUTH DAILY. Patient taking differently: Take 200 mg by mouth daily.  09/25/19   Troy Sine, MD  nilotinib (TASIGNA) 150 MG capsule Take 2 capsules (300 mg total) by mouth every 12 (twelve) hours. Take on an empty stomach, 1 hour before or 2 hours after meals. 07/07/19   Wyatt Portela, MD  oxyCODONE-acetaminophen (PERCOCET) 5-325 MG tablet Take 1 tablet by mouth every 6 (six) hours as needed. Patient not taking: Reported on 01/09/2020 12/17/19   Gabriel Earing, PA-C  sitaGLIPtin (JANUVIA) 50 MG tablet Take 50 mg by mouth daily.  07/13/16   [provider]  spironolactone (ALDACTONE) 25 MG tablet Take 0.5 tablets (12.5 mg total) by mouth 2 (two) times daily. Patient taking differently: Take 12.5 mg by mouth every evening.  01/24/18 11/27/20  Troy Sine, MD  sulfamethoxazole-trimethoprim (BACTRIM DS) 800-160 MG tablet Take 1 tablet by mouth 2 (two) times daily for 10 days. 01/09/20 01/19/20  [name], MD  SYNTHROID 137 MCG tablet Take 137 mcg by mouth daily before breakfast. 11/15/19   [provider]  TRULICITY 9.47 SJ/6.2EZ SOPN Inject 0.75 mg into the skin every Sunday.  07/25/19   [provider]    Allergies    Losartan and Lisinopril  Review of Systems   Review of Systems  Constitutional: Negative for chills and fever.  HENT: Negative for congestion, rhinorrhea and sore throat.   Eyes: Negative for visual disturbance.  Respiratory: Negative for cough and shortness of breath.   Cardiovascular: Negative for chest pain and leg swelling.  Gastrointestinal: Negative for abdominal pain, diarrhea, nausea and vomiting.  Genitourinary: Negative for dysuria.  Musculoskeletal: Negative for back pain and neck pain.  Skin: Positive for wound. Negative for rash.  Neurological: Negative for dizziness, light-headedness and headaches.  Hematological: Does not bruise/bleed easily.  Psychiatric/Behavioral: Negative for confusion.    Physical Exam Updated Vital Signs BP 110/79 (BP Location: Right Arm)   Pulse 87   Temp 98.1 F (36.7 C) (Oral)   Resp 18   SpO2 96%   Physical Exam Vitals and nursing note reviewed.  Constitutional:      General: She is not in acute distress.    Appearance: Normal appearance. She is well-developed.  HENT:     Head: Normocephalic and atraumatic.  Eyes:     Extraocular Movements: Extraocular movements intact.     Conjunctiva/sclera: Conjunctivae normal.     Pupils: Pupils are equal, round, and reactive to light.  Cardiovascular:     Rate and Rhythm: Normal rate and regular  rhythm.     Heart sounds: No murmur.  Pulmonary:     Effort: Pulmonary effort is normal. No respiratory distress.     Breath sounds: Normal breath sounds.  Abdominal:     Palpations: Abdomen is soft.     Tenderness: There is no abdominal tenderness.  Musculoskeletal:     Cervical back: Normal range of motion and neck supple.     Comments: Medial right leg wound.  Not undressed because I just been dressed by vascular surgery.  Bleeding currently controlled.  Femoral medial wound well-healed.  Dorsalis pedis pulses about 1+ to the right foot.  Patient has some ischemic changes to her right great toe.  Foot is warm.  No significant erythema around the wound that is bleeding.  Skin:    General: Skin is warm and dry.  Neurological:     Mental Status: She is alert.     ED Results / Procedures / Treatments   Labs (all labs ordered are listed, but only abnormal results are displayed) Labs Reviewed  RESPIRATORY PANEL BY RT PCR (FLU A&B, COVID)    EKG None  Radiology No results found.  Procedures Procedures (including critical care time)  Medications Ordered in ED Medications - No data to display  ED Course  I have reviewed the triage vital signs and the nursing notes.  Pertinent labs & imaging results that were available during my care of the patient were reviewed by me and considered in my medical decision making (see chart for details).    MDM Rules/Calculators/A&P                     Patient will be taken to the operating room by vascular surgery.  Vital signs are currently without evidence of any significant tachycardia or hypotension.  Final Clinical Impression(s) / ED Diagnoses Final diagnoses:  Bleeding from wound    Rx / DC Orders ED Discharge Orders    None       Fredia Sorrow, MD 01/10/20 1801

## 2020-01-11 ENCOUNTER — Encounter (HOSPITAL_COMMUNITY): Payer: Self-pay | Admitting: Surgery

## 2020-01-11 ENCOUNTER — Other Ambulatory Visit: Payer: Self-pay

## 2020-01-11 LAB — CBC WITH DIFFERENTIAL/PLATELET
Abs Immature Granulocytes: 1.22 10*3/uL — ABNORMAL HIGH (ref 0.00–0.07)
Basophils Absolute: 0.1 10*3/uL (ref 0.0–0.1)
Basophils Relative: 0 %
Eosinophils Absolute: 0 10*3/uL (ref 0.0–0.5)
Eosinophils Relative: 0 %
HCT: 28.2 % — ABNORMAL LOW (ref 36.0–46.0)
Hemoglobin: 9 g/dL — ABNORMAL LOW (ref 12.0–15.0)
Immature Granulocytes: 4 %
Lymphocytes Relative: 10 %
Lymphs Abs: 3.1 10*3/uL (ref 0.7–4.0)
MCH: 27.4 pg (ref 26.0–34.0)
MCHC: 31.9 g/dL (ref 30.0–36.0)
MCV: 85.7 fL (ref 80.0–100.0)
Monocytes Absolute: 1.6 10*3/uL — ABNORMAL HIGH (ref 0.1–1.0)
Monocytes Relative: 5 %
Neutro Abs: 23.9 10*3/uL — ABNORMAL HIGH (ref 1.7–7.7)
Neutrophils Relative %: 81 %
Platelets: 663 10*3/uL — ABNORMAL HIGH (ref 150–400)
RBC: 3.29 MIL/uL — ABNORMAL LOW (ref 3.87–5.11)
RDW: 16.2 % — ABNORMAL HIGH (ref 11.5–15.5)
WBC: 29.9 10*3/uL — ABNORMAL HIGH (ref 4.0–10.5)
nRBC: 0.4 % — ABNORMAL HIGH (ref 0.0–0.2)

## 2020-01-11 LAB — GLUCOSE, CAPILLARY
Glucose-Capillary: 168 mg/dL — ABNORMAL HIGH (ref 70–99)
Glucose-Capillary: 155 mg/dL — ABNORMAL HIGH (ref 70–99)
Glucose-Capillary: 144 mg/dL — ABNORMAL HIGH (ref 70–99)
Glucose-Capillary: 175 mg/dL — ABNORMAL HIGH (ref 70–99)

## 2020-01-11 LAB — CBC
HCT: 20.1 % — ABNORMAL LOW (ref 36.0–46.0)
Hemoglobin: 5.9 g/dL — CL (ref 12.0–15.0)
MCH: 25.8 pg — ABNORMAL LOW (ref 26.0–34.0)
MCHC: 29.4 g/dL — ABNORMAL LOW (ref 30.0–36.0)
MCV: 87.8 fL (ref 80.0–100.0)
Platelets: 548 10*3/uL — ABNORMAL HIGH (ref 150–400)
RBC: 2.29 MIL/uL — ABNORMAL LOW (ref 3.87–5.11)
RDW: 16.6 % — ABNORMAL HIGH (ref 11.5–15.5)
WBC: 22.2 10*3/uL — ABNORMAL HIGH (ref 4.0–10.5)
nRBC: 0.1 % (ref 0.0–0.2)

## 2020-01-11 LAB — PREPARE RBC (CROSSMATCH)

## 2020-01-11 LAB — BASIC METABOLIC PANEL
Anion gap: 12 (ref 5–15)
BUN: 26 mg/dL — ABNORMAL HIGH (ref 8–23)
CO2: 24 mmol/L (ref 22–32)
Calcium: 8.3 mg/dL — ABNORMAL LOW (ref 8.9–10.3)
Chloride: 100 mmol/L (ref 98–111)
Creatinine, Ser: 1.72 mg/dL — ABNORMAL HIGH (ref 0.44–1.00)
GFR calc Af Amer: 32 mL/min — ABNORMAL LOW (ref >60)
GFR calc non Af Amer: 28 mL/min — ABNORMAL LOW (ref >60)
Glucose, Bld: 200 mg/dL — ABNORMAL HIGH (ref 70–99)
Potassium: 4.1 mmol/L (ref 3.5–5.1)
Sodium: 136 mmol/L (ref 135–145)

## 2020-01-11 MED ORDER — ENSURE ENLIVE PO LIQD: 237.0000 mL | Freq: Two times a day (BID) | ORAL | Status: DC

## 2020-01-11 MED ORDER — NILOTINIB HCL 150 MG PO CAPS
300.0000 mg | ORAL_CAPSULE | Freq: Two times a day (BID) | ORAL | Status: DC
  Administered 2020-01-11 – 2020-01-12 (×2): 300 mg via ORAL
  Filled 2020-01-11 (×2): qty 2

## 2020-01-11 MED ORDER — ADULT MULTIVITAMIN W/MINERALS CH
1.0000 | ORAL_TABLET | Freq: Every day | ORAL | Status: DC
  Administered 2020-01-11 – 2020-01-12 (×2): 1 via ORAL
  Filled 2020-01-11 (×2): qty 1

## 2020-01-11 MED ORDER — PRO-STAT SUGAR FREE PO LIQD
30.0000 mL | Freq: Three times a day (TID) | ORAL | Status: DC
  Administered 2020-01-11 – 2020-01-12 (×2): 30 mL via ORAL
  Filled 2020-01-11 (×3): qty 30

## 2020-01-11 MED ORDER — SODIUM CHLORIDE 0.9% IV SOLUTION: Freq: Once | INTRAVENOUS | Status: AC

## 2020-01-11 NOTE — TOC Initial Note (Signed)
Transition of Care (TOC) - Initial/Assessment Note  Marvetta Gibbons RN, BSN Transitions of Care Unit 4E- RN Case Manager [phone]   Patient Details  Name: Sara Warner MRN: [id number] Date of Birth: 1941-04-29  Transition of Care [facility]) CM/SW Contact:    Dawayne Patricia, RN Phone Number: 01/11/2020, 3:19 PM  Clinical Narrative:                 Pt s/p I&D with wound VAC placed- referral received for [facility] and home wound VAC needs- [employer] home wound VAC form placed on shadow chart- form has been signed- and faxed to [facility]- spoke with Olivia Mackie at [facility] who will process for approval- once approved home VAC will be release for delivery here to pt's room for transition home. Cm spoke with pt and daughter Caryl Asp at the bedside- discussed [facility] needs- list provided Per CMS guidelines from medicare.gov website with star ratings (copy placed in shadow chart)- pt reports she is already active with [facility]- per PT/OT no f/u needs noted- pt states she has all needed DME at home- call made to [facility] with [facility]- confirmed pt is active with them- and they can provide needed services for wound VAC needs at home. - they will plan to do first drsg change at home on Monday 2/15- pending d/c tomorrow or over the weekend.   Expected Discharge Plan: [place] Barriers to Discharge: Continued Medical Work up   Patient Goals and CMS Choice Patient states their goals for this hospitalization and ongoing recovery are:: return home get better CMS Medicare.gov Compare Post Acute Care list provided to:: Patient Choice offered to / list presented to : Patient  Expected Discharge Plan and Services Expected Discharge Plan: [place]   Discharge Planning Services: CM Consult Post Acute Care Choice: Durable Medical Equipment, Home Health, Resumption of Svcs/PTA Provider Living arrangements for the past 2 months: Single Family Home                 DME Arranged: Vac DME  Agency: [employer] Date DME Agency Contacted: 01/11/20 Time DME Agency Contacted: 1200 Representative spoke with at DME Agency: [place]: RN [place] Agency: [place] Date [place]: 01/11/20 Time [place]: 51 Representative spoke with at [place]: Sharmon Revere  Prior Living Arrangements/Services Living arrangements for the past 2 months: [place] with:: Self, Adult Children Patient language and need for interpreter reviewed:: Yes Do you feel safe going back to the place where you live?: Yes      Need for Family Participation in Patient Care: Yes (Comment) Care giver support system in place?: Yes (comment) Current home services: DME, Home RN Criminal Activity/Legal Involvement Pertinent to Current Situation/Hospitalization: No - Comment as needed  Activities of Daily Living Home Assistive Devices/Equipment: Eyeglasses, [place] Rile (specify type) ADL Screening (condition at time of admission) Patient's cognitive ability adequate to safely complete daily activities?: Yes Is the patient deaf or have difficulty hearing?: No Does the patient have difficulty seeing, even when wearing glasses/contacts?: No Does the patient have difficulty concentrating, remembering, or making decisions?: No Patient able to express need for assistance with ADLs?: Yes Does the patient have difficulty dressing or bathing?: No Independently performs ADLs?: Yes (appropriate for developmental age) Does the patient have difficulty walking or climbing stairs?: Yes Weakness of Legs: Both Weakness of Arms/Hands: None  Permission Sought/Granted Permission sought to share information with : Chartered certified accountant granted  to share information with : Yes, Verbal Permission Granted     Permission granted to share info w AGENCY: HH        Emotional Assessment Appearance:: Appears stated age Attitude/Demeanor/Rapport: Engaged Affect  (typically observed): Appropriate, Pleasant Orientation: : Oriented to Self, Oriented to Place, Oriented to  Time, Oriented to Situation   Psych Involvement: No (comment)  Admission diagnosis:  Bleeding from wound [T14.8XXA] 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 PAD (peripheral artery disease) ([place]) 12/15/2019  . Critical lower limb ischemia 11/28/2019  . Bilateral impacted cerumen 06/23/2017  . 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 NICM (nonischemic cardiomyopathy) ([place])   . Chronic atrial fibrillation ([place])   . Hypertensive heart disease   . Stage III chronic kidney disease 10/02/2015  . Controlled type 2 diabetes mellitus without complication ([place]) 23/36/1224  . 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 Atrial fibrillation ([place]) 01/05/2012  . Hypothyroid 01/05/2012  . Cardiomyopathy ([place]) 01/05/2012   PCP:  Cyndi Bender, PA-C Pharmacy:   [id number] [place], [address] [place] [address] Phone: [phone] Fax: [phone]     Social Determinants of Health (SDOH) Interventions    Readmission Risk Interventions No flowsheet data found.

## 2020-01-11 NOTE — Progress Notes (Addendum)
Initial Nutrition Assessment  DOCUMENTATION CODES:   Non-severe (moderate) malnutrition in context of acute illness/injury  INTERVENTION:    Magic cup TID with meals, each supplement provides 290 kcal and 9 grams of protein  30 ml Prostat TID, each supplement provides 100 kcals and 15 grams protein.   MVI daily   NUTRITION DIAGNOSIS:   Moderate Malnutrition related to acute illness(recent vascular surgery) as evidenced by mild fat depletion, moderate muscle depletion, percent weight loss, energy intake < 75% for > 7 days.  GOAL:   Patient will meet greater than or equal to 90% of their needs  MONITOR:   PO intake, Weight trends, Supplement acceptance, Labs, I & O's, Skin  REASON FOR ASSESSMENT:   Malnutrition Screening Tool    ASSESSMENT:   Patient with PMH significant for DM, CKD III, HTN, CHF, PAD, and R common femoral to below knee pop bypass on 1/15. Presents this admission with bleeding from postoperative wound of R leg.   Pt reports having loss of appetite since surgery on 1/15. States during this time she consumed 1-2 meals daily that consisted of stewed beef, vegetables, and grain. Does not use supplementation. States appetite was great prior to 1/5. Consumed three meals daily and finished 75-100% of each. Pt tolerated eggs and oatmeal this am. Discussed the importance of protein intake for preservation of lean body mass. Does not wish have Ensure/Boost. Will try magic cup.   Pt endorses a UBW of 190 lb and an unknown amount of recent wt loss. Records indicate pt weighed 180 lb on 1/6 and 171 lb this admission (5% wt loss in one month, significant for time frame).   I/O: +1,234 ml since admit UOP:  700 ml x 24 hrs  Wound VAC: 150 ml x 24 hrs   Drips: NS @ 50 ml/hr  Medications: colace, 20 mg lasix BID, glipizide, SS novolog, aldactone  Labs: CBG 154-200  NUTRITION - FOCUSED PHYSICAL EXAM:    Most Recent Value  Orbital Region  Mild depletion  Upper Arm  Region  Mild depletion  Thoracic and Lumbar Region  Unable to assess  Buccal Region  Mild depletion  Temple Region  Moderate depletion  Clavicle Bone Region  Moderate depletion  Clavicle and Acromion Bone Region  Moderate depletion  Scapular Bone Region  Unable to assess  Dorsal Hand  Mild depletion  Patellar Region  No depletion  Anterior Thigh Region  No depletion  Edema (RD Assessment)  Mild  Hair  Reviewed  Eyes  Reviewed  Mouth  Reviewed  Skin  Reviewed  Nails  Reviewed     Diet Order:   Diet Order            Diet Carb Modified Fluid consistency: Thin; Room service appropriate? Yes with Assist  Diet effective now              EDUCATION NEEDS:   Education needs have been addressed  Skin:  Skin Assessment: Skin Integrity Issues: Skin Integrity Issues:: Incisions Incisions: R leg  Last BM:  2/9  Height:   Ht Readings from Last 1 Encounters:  01/10/20 5\' 8"  (1.727 m)    Weight:   Wt Readings from Last 1 Encounters:  01/10/20 78 kg    Ideal Body Weight:  63.6 kg  BMI:  Body mass index is 26.15 kg/m.  Estimated Nutritional Needs:   Kcal:  1800-2000 kcal  Protein:  90-105 grams  Fluid:  >/= 1.8 L/day   Mariana Single RD, LDN  Clinical Nutrition Pager listed in [place]

## 2020-01-11 NOTE — Progress Notes (Signed)
CRITICAL VALUE ALERT  Critical Value:  Hgb 5.9  Date & Time Notied:  01/11/20 at [place]  Provider Notified: Harold Barban, MD  Orders Received/Actions taken: Give 1 Unit of RBCs

## 2020-01-11 NOTE — Discharge Instructions (Signed)
Negative Pressure Wound Therapy Home Guide °Negative pressure wound therapy (NPWT) uses a sponge or foam-like material (dressing) placed on or inside the wound. The wound is then covered and sealed with a cover dressing that sticks to your skin (is adhesive). This keeps air out. A tube is attached to the cover dressing, and this tube connects to a small pump. The pump sucks fluid and germs from the wound. NPWT helps to increase blood flow to the wound and heal it from the inside. °What are the risks? °NPWT is usually safe to use. However, problems can occur, including: °· Skin irritation from the dressing adhesive. °· Bleeding. °· Infection. °· Dehydration. Wounds with large amounts of drainage can cause excessive fluid loss. °· Pain. °Supplies needed: °· A disposable garbage bag. °· Soap and water, or hand sanitizer. °· Wound cleanser or salt-water solution (saline). °· New sponge and cover dressing. °· Protective clothing. °· Gauze pad. °· Vinyl gloves. °· Tape. °· Skin protectant. This may be a wipe, film, or spray. °· Clean or germ-free (sterile) scissors. °· Eye protection. °How to change your dressing °Prepare to change your dressing ° °1. If told by your health care provider, take pain medicine 30 minutes before changing the dressing. °2. Wash your hands with soap and water. Dry your hands with a clean towel. If soap and water are not available, use hand sanitizer. °3. Set up a clean station for wound care. °4. Open the dressing package so that the sponge dressing remains on the inside of the package. °5. Wear gloves, protective clothing, and eye protection. °Remove old dressing ° °1. Turn off the pump and disconnect the tubing from the dressing. °2. Carefully remove the adhesive cover dressing in the direction of your hair growth. °3. Remove the sponge dressing that is inside the wound. If the sponge sticks, use a wound cleanser or saline solution to wet the sponge and help it come off more easily. °4. Throw  the old sponge and cover dressing supplies into the garbage bag. °5. Remove your gloves by grabbing the cuff and turning the glove inside out. Place the gloves in the trash immediately. °6. Wash your hands with soap and water. Dry your hands with a clean towel. If soap and water are not available, use hand sanitizer. °Clean your wound °· Wear gloves, protective clothing, and eye protection. °Follow your health care provider's instructions on how to clean your wound. You may be told to: °1. Clean the wound using a saline solution or a wound cleanser and a clean gauze pad. °2. Pat the wound dry with a gauze pad. Do not rub the wound. °3. Throw the gauze pad into the garbage bag. °4. Remove your gloves by grabbing the cuff and turning the glove inside out. Place the gloves in the trash immediately. °5. Wash your hands with soap and water. Dry your hands with a clean towel. If soap and water are not available, use hand sanitizer. °Apply new dressing °· Wear gloves, protective clothing, and eye protection. °1. If told by your health care provider, apply a skin protectant to any skin that will be exposed to adhesive. Let the skin protectant dry. °2. Cut a piece of new sponge dressing and put it on or in the wound. °3. Using clean scissors, cut a nickel-sized hole in the new cover dressing. °4. Apply the cover dressing. °5. Attach the suction tube over the hole in the cover dressing. °6. Take off your gloves. Put them in the   plastic bag with the old dressing. Tie the bag shut and throw it away. °7. Wash your hands with soap and water. Dry your hands with a clean towel. If soap and water are not available, use hand sanitizer. °8. Turn the pump back on. The sponge dressing should collapse. Do not change the settings on the machine without talking to a health care provider. °9. Replace the container in the pump that collects fluid if it is full. Replace the container per the manufacturer's instructions or at least once a  week, even if it is not full. °General tips and recommendations °If the alarm sounds: °· Stay calm. °· Do not turn off the pump or do anything with the dressing. °· Reasons the alarm may go off: °? The battery is low. Change the battery or plug the device into electrical power. °? The dressing has a leak. Find the leak and put tape over the leak. °? The fluid collection container is full. Change the fluid container. °· Call your health care provider right away if you cannot fix the problem. °· Explain to your health care provider what is happening. Follow his or her instructions. °General instructions °· Do not turn off the pump unless told to do so by your health care provider. °· Do not turn off the pump for more than 2 hours. If the pump is off for more than 2 hours, the dressing will need to be changed. °· If your health care provider says it is okay to shower: °? Do not take the pump into the shower. °? Make sure the wound dressing is protected and sealed. The wound dressing must stay dry. °· Check frequently that the machine indicates that therapy is on and that all clamps are open. °· Do not use over-the-counter medicated or antiseptic creams, sprays, liquids, or dressings unless your health care provider approves. °Contact a health care provider if: °· You have new pain. °· You develop irritation, a rash, or itching around the wound or dressing. °· You see new black or yellow tissue in your wound. °· The dressing changes are painful or cause bleeding. °· The pump has been off for more than 2 hours, and you do not know how to change the dressing. °· The pump alarm goes off, and you do not know what to do. °Get help right away if: °· You have a lot of bleeding. °· The wound breaks open. °· You have severe pain. °· You have signs of infection, such as: °? More redness, swelling, or pain. °? More fluid or blood. °? Warmth. °? Pus or a bad smell. °? Red streaks leading from the wound. °? A fever. °· You see a  sudden change in the color or texture of the drainage. °· You have signs of dehydration, such as: °? Little or no tears, urine, or sweat. °? Muscle cramps. °? Very dry mouth. °? Headache. °? Dizziness. °Summary °· Negative pressure wound therapy (NPWT) is a device that helps your wound heal. °· Set up a clean station for wound care. Your health care provider will tell you what supplies to use. °· Follow your health care provider's instructions on how to clean your wound and how to change the dressing. °· Contact a health care provider if you have new pain, an irritation, or a rash, or if the alarm goes off and you do not know what to do. °· Get help right away if you have a lot of bleeding, your wound breaks   open, or you have severe pain. Also, get help if you have signs of infection. °This information is not intended to replace advice given to you by your health care provider. Make sure you discuss any questions you have with your health care provider. °Document Revised: 03/10/2019 Document Reviewed: 02/03/2019 °Elsevier Patient Education © 2020 [employer] ° °

## 2020-01-11 NOTE — Evaluation (Signed)
Physical Therapy Evaluation Patient Details Name: Sara Warner MRN: [id number] DOB: April 30, 1941 Today's Date: 01/11/2020   History of Present Illness  Pt isa 79 y.o. female with recent R femoral to below knee pop bypass (12/15/19), now admitted 01/10/20 with significant bleeding; s/p exploration of bypass graft and application of wound VAC on 2/10. PMH includes PAD, HTN, DM, CKD III, CHF, afib, NICM.    Clinical Impression  Pt presents with an overall decrease in functional mobility secondary to above. PTA, pt mod independent household ambulator with rollator, daughter assists with ADL tasks as needed. Today, pt moving well with RW at supervision-level; denies pain at rest and with mobility; denies dizziness with activity (see BP values below). Educ on precautions, positioning, therex/ROM, edema control, and importance of mobility. Pt would benefit from continued acute PT services to maximize functional mobility and independence prior to d/c home.   Supine BP 126/55 Sitting BP 115/63 Post-standing BP 114/60   Follow Up Recommendations No PT follow up;Supervision for mobility/OOB    Equipment Recommendations  None recommended by PT    Recommendations for Other Services       Precautions / Restrictions Precautions Precautions: Fall;Other (comment) Precaution Comments: R lower leg wound vac Restrictions Weight Bearing Restrictions: No      Mobility  Bed Mobility Overal bed mobility: Modified Independent             General bed mobility comments: HOB slightly elevated  Transfers Overall transfer level: Needs assistance Equipment used: Rolling walker (2 wheeled);None Transfers: Sit to/from Stand Sit to Stand: Supervision         General transfer comment: Able to stand without and without RW, supervision for safety; denies dizziness, BP stable  Ambulation/Gait Ambulation/Gait assistance: Supervision Gait Distance (Feet): 5 Feet Assistive device: Rolling walker (2  wheeled) Gait Pattern/deviations: Step-through pattern;Decreased stride length;Decreased weight shift to right   Gait velocity interpretation: <1.8 ft/sec, indicate of risk for recurrent falls General Gait Details: Slow, steady gait with RW at supervision-level, no difficulty with turning or backwards steps; pt asymptomatic, seated rest to check BP due to recently receiving blood transfusion for low hgb; OT present for additional mobility  Stairs            Wheelchair Mobility    Modified Rankin (Stroke Patients Only)       Balance Overall balance assessment: Needs assistance   Sitting balance-Leahy Scale: Good Sitting balance - Comments: Seated balance good but difficulty reaching feet at baseline     Standing balance-Leahy Scale: Fair Standing balance comment: Can static stand without UE support                             Pertinent Vitals/Pain Pain Assessment: No/denies pain    Home Living Family/patient expects to be discharged to:: Private residence Living Arrangements: Children Available Help at Discharge: Family;Available 24 hours/day Type of Home: House Home Access: Ramped entrance     Home Layout: One level Home Equipment: Walker - 4 wheels;Cane - single point;Transport chair;Shower seat      Prior Function Level of Independence: Needs assistance   Gait / Transfers Assistance Needed: Household ambulation by holding onto furniture or rollator. Pt sleeps in recliner  ADL's / Homemaking Assistance Needed: Daughter assists with sponge baths and lower body dressing        Hand Dominance   Dominant Hand: Right    Extremity/Trunk Assessment   Upper Extremity Assessment Upper Extremity Assessment: Overall  WFL for tasks assessed    Lower Extremity Assessment Lower Extremity Assessment: RLE deficits/detail RLE Deficits / Details: post-op with wound vac on lower leg; functional strength at least 3/5 RLE Sensation: decreased light touch     Cervical / Trunk Assessment Cervical / Trunk Assessment: Normal  Communication   Communication: No difficulties  Cognition Arousal/Alertness: Awake/alert Behavior During Therapy: WFL for tasks assessed/performed Overall Cognitive Status: Within Functional Limits for tasks assessed                                        General Comments General comments (skin integrity, edema, etc.): Educ on edema control (elevation, knee/ankle AROM) and activity recommendations    Exercises     Assessment/Plan    PT Assessment Patient needs continued PT services  PT Problem List Decreased activity tolerance;Decreased balance;Decreased mobility;Decreased knowledge of use of DME       PT Treatment Interventions DME instruction;Gait training;Functional mobility training;Therapeutic activities;Therapeutic exercise;Balance training;Patient/family education    PT Goals (Current goals can be found in the Care Plan section)  Acute Rehab PT Goals Patient Stated Goal: Hopefully home tomorrow PT Goal Formulation: With patient Time For Goal Achievement: 01/25/20 Potential to Achieve Goals: Good    Frequency Min 3X/week   Barriers to discharge        Co-evaluation               AM-PAC PT "6 Clicks" Mobility  Outcome Measure Help needed turning from your back to your side while in a flat bed without using bedrails?: None Help needed moving from lying on your back to sitting on the side of a flat bed without using bedrails?: None Help needed moving to and from a bed to a chair (including a wheelchair)?: None Help needed standing up from a chair using your arms (e.g., wheelchair or bedside chair)?: None Help needed to walk in hospital room?: A Little Help needed climbing 3-5 steps with a railing? : A Little 6 Click Score: 22    End of Session   Activity Tolerance: Patient tolerated treatment well Patient left: in chair;with call bell/phone within reach(with OT present  for eval) Nurse Communication: Mobility status PT Visit Diagnosis: Other abnormalities of gait and mobility (R26.89)    Time: 8338-2505 PT Time Calculation (min) (ACUTE ONLY): 13 min   Charges:   PT Evaluation $PT Eval Low Complexity: Parcoal, PT, DPT Acute Rehabilitation Services  Pager [phone] Office [place] 01/11/2020, 10:19 AM

## 2020-01-11 NOTE — Progress Notes (Signed)
Foley catheter removed per MD order without difficulty.  Will continue to monitor. 

## 2020-01-11 NOTE — Progress Notes (Signed)
Pt's home med sent to pharmacy for dispensing.

## 2020-01-11 NOTE — Evaluation (Signed)
Occupational Therapy Evaluation Patient Details Name: Sara Warner MRN: [id number] DOB: 12/25/1940 Today's Date: 01/11/2020    History of Present Illness Pt isa 79 y.o. female with recent R femoral to below knee pop bypass (12/15/19), now admitted 01/10/20 with significant bleeding; s/p exploration of bypass graft and application of wound VAC on 2/10. PMH includes PAD, HTN, DM, CKD III, CHF, afib, NICM.   Clinical Impression   Pt is at sup level with mobility with and without use of RW, min A with LB selfcare ( R LE wound vac). Pt lives at home with family, uses rollater and her daughter was assisting her with LB selfcare at [facility]. All education completed and no further acute OT is indicated ath this time    Follow Up Recommendations  No OT follow up;Supervision - Intermittent    Equipment Recommendations  None recommended by OT    Recommendations for Other Services       Precautions / Restrictions Precautions Precautions: Fall;Other (comment) Precaution Comments: R lower leg wound vac Restrictions Weight Bearing Restrictions: No      Mobility Bed Mobility Overal bed mobility: Modified Independent             General bed mobility comments: pt in recliner upon arrival. Per PT note pt is Mod I  Transfers Overall transfer level: Needs assistance Equipment used: Rolling walker (2 wheeled);None Transfers: Sit to/from Stand Sit to Stand: Supervision         General transfer comment: Able to stand without and without RW, supervision for safety; denies dizziness, BP stable    Balance Overall balance assessment: Needs assistance   Sitting balance-Leahy Scale: Good Sitting balance - Comments: Seated balance good but difficulty reaching feet at baseline   Standing balance support: Single extremity supported;Bilateral upper extremity supported;During functional activity Standing balance-Leahy Scale: Fair Standing balance comment: Can static stand without UE support                            ADL either performed or assessed with clinical judgement   ADL Overall ADL's : Needs assistance/impaired Eating/Feeding: Independent;Sitting   Grooming: Wash/dry hands;Wash/dry face;Oral care;Supervision/safety;Standing   Upper Body Bathing: Set up;Independent   Lower Body Bathing: Minimal assistance;With caregiver independent assisting   Upper Body Dressing : Set up;Independent   Lower Body Dressing: Minimal assistance;With caregiver independent assisting   Toilet Transfer: Supervision/safety;Ambulation;RW   Toileting- Clothing Manipulation and Hygiene: Supervision/safety;Sit to/from stand       Functional mobility during ADLs: Supervision/safety;Rolling walker       Vision Patient Visual Report: No change from baseline       Perception     Praxis      Pertinent Vitals/Pain Pain Assessment: No/denies pain     Hand Dominance Right   Extremity/Trunk Assessment Upper Extremity Assessment Upper Extremity Assessment: Overall WFL for tasks assessed   Lower Extremity Assessment Lower Extremity Assessment: Defer to PT evaluation RLE Deficits / Details: post-op with wound vac on lower leg; functional strength at least 3/5 RLE Sensation: decreased light touch   Cervical / Trunk Assessment Cervical / Trunk Assessment: Normal   Communication Communication Communication: No difficulties   Cognition Arousal/Alertness: Awake/alert Behavior During Therapy: WFL for tasks assessed/performed Overall Cognitive Status: Within Functional Limits for tasks assessed  General Comments  Educ on edema control (elevation, knee/ankle AROM) and activity recommendations    Exercises     Shoulder Instructions      Home Living Family/patient expects to be discharged to:: Private residence Living Arrangements: Children Available Help at Discharge: Family;Available 24 hours/day Type of Home:  House Home Access: Ramped entrance     Home Layout: One level     Bathroom Shower/Tub: Teacher, early years/pre: Handicapped height     Home Equipment: Environmental consultant - 4 wheels;Cane - single point;Transport chair;Shower seat          Prior Functioning/Environment Level of Independence: Needs assistance  Gait / Transfers Assistance Needed: Household ambulation by holding onto furniture or rollator. Pt sleeps in recliner ADL's / Homemaking Assistance Needed: Daughter assists with sponge baths and lower body dressing   Comments: pt ambulates household distances with Rollator        OT Problem List: Impaired balance (sitting and/or standing);Decreased activity tolerance      OT Treatment/Interventions:      OT Goals(Current goals can be found in the care plan section) Acute Rehab OT Goals Patient Stated Goal: home OT Goal Formulation: With patient  OT Frequency:     Barriers to D/C:    no barriers       Co-evaluation              AM-PAC OT "6 Clicks" Daily Activity     Outcome Measure Help from another person eating meals?: None Help from another person taking care of personal grooming?: None Help from another person toileting, which includes using toliet, bedpan, or urinal?: None Help from another person bathing (including washing, rinsing, drying)?: A Little Help from another person to put on and taking off regular upper body clothing?: None Help from another person to put on and taking off regular lower body clothing?: A Little 6 Click Score: 22   End of Session Equipment Utilized During Treatment: Gait belt;Rolling walker  Activity Tolerance: Patient tolerated treatment well Patient left: in chair;with call bell/phone within reach  OT Visit Diagnosis: Other abnormalities of gait and mobility (R26.89)                Time: 2820-8138 OT Time Calculation (min): 25 min Charges:  OT Treatments $Self Care/Home Management : 8-22 mins    Britt Bottom 01/11/2020, 12:20 PM

## 2020-01-11 NOTE — Progress Notes (Addendum)
Progress Note    01/11/2020 7:54 AM 1 Day Post-Op  Subjective:  Did well overnight. No pain in right leg or foot   Vitals:   01/11/20 0600 01/11/20 0636  BP: (!) 116/56 (!) 131/55  Pulse: 73 100  Resp: 15 13  Temp: 97.6 F (36.4 C) 97.8 F (36.6 C)  SpO2: 95% 96%   Physical Exam: General: Well appearing, in no distress Lungs:  Non labored, equal expansion bilaterally Incisions:  Right groin and thigh incisions intact, dry and healing well. Right medial below knee incision with wound VAC applied. Well sealed. No extension of venous bleeding outside of marked area. 150 cc of SS drainage in VAC Extremities:  Right femoral pulse 3+, Right popliteal pulse not palpable, Doppler DP and PT. Right foot warm. Dry eschar on distal right 1st toe. Abdomen:  Soft, nontender Neurologic: alert and oriented  CBC    Component Value Date/Time   WBC 22.2 (H) 01/11/2020 0041   RBC 2.29 (L) 01/11/2020 0041   HGB 5.9 (LL) 01/11/2020 0041   HGB 12.9 04/28/2019 1307   HGB 13.5 03/22/2018 0957   HGB 13.2 09/08/2017 1417   HCT 20.1 (L) 01/11/2020 0041   HCT 40.4 03/22/2018 0957   HCT 41.0 09/08/2017 1417   PLT 548 (H) 01/11/2020 0041   PLT 379 04/28/2019 1307   PLT 372 03/22/2018 0957   MCV 87.8 01/11/2020 0041   MCV 84 03/22/2018 0957   MCV 86.2 09/08/2017 1417   MCH 25.8 (L) 01/11/2020 0041   MCHC 29.4 (L) 01/11/2020 0041   RDW 16.6 (H) 01/11/2020 0041   RDW 14.7 03/22/2018 0957   RDW 15.9 (H) 09/08/2017 1417   LYMPHSABS 3.0 04/28/2019 1307   LYMPHSABS 3.6 (H) 09/08/2017 1417   MONOABS 0.7 04/28/2019 1307   MONOABS 0.7 09/08/2017 1417   EOSABS 0.4 04/28/2019 1307   EOSABS 0.2 09/08/2017 1417   BASOSABS 0.2 (H) 04/28/2019 1307   BASOSABS 0.1 09/08/2017 1417    BMET    Component Value Date/Time   NA 136 01/11/2020 0041   NA 140 03/22/2018 0957   NA 140 09/08/2017 1417   K 4.1 01/11/2020 0041   K 4.0 09/08/2017 1417   CL 100 01/11/2020 0041   CL 106 04/26/2013 1252   CO2  24 01/11/2020 0041   CO2 24 09/08/2017 1417   GLUCOSE 200 (H) 01/11/2020 0041   GLUCOSE 143 (H) 09/08/2017 1417   GLUCOSE 177 (H) 04/26/2013 1252   BUN 26 (H) 01/11/2020 0041   BUN 28 (H) 03/22/2018 0957   BUN 26.9 (H) 09/08/2017 1417   CREATININE 1.72 (H) 01/11/2020 0041   CREATININE 2.02 (H) 04/28/2019 1307   CREATININE 1.4 (H) 09/08/2017 1417   CALCIUM 8.3 (L) 01/11/2020 0041   CALCIUM 9.5 09/08/2017 1417   GFRNONAA 28 (L) 01/11/2020 0041   GFRNONAA 23 (L) 04/28/2019 1307   GFRAA 32 (L) 01/11/2020 0041   GFRAA 27 (L) 04/28/2019 1307    INR    Component Value Date/Time   INR 1.1 12/13/2019 1352     Intake/Output Summary (Last 24 hours) at 01/11/2020 0754 Last data filed at 01/11/2020 8242 Gross per 24 hour  Intake 1821.44 ml  Output 900 ml  Net 921.44 ml     Assessment/Plan:  79 y.o. female is s/p exploration of recent right femoral to popliteal bypass graft. Ligation of bleeding venous branch, with application of wound VAC 1 Day Post-Op. Wound VAC well sealed with about 150 cc output this morning.  Right 1st toe wound will continue to demarcate. She is being Transfused with 2 units PRBC for HGB of 5.9. Wound cultures still pending. Orders for [facility] RN for wound VAC care and DME ordered. Anticipate her being able to possibly d/c tomorrow   DVT prophylaxis: SQ Heparin    Karoline Caldwell, PA-C [facility] [phone] 01/11/2020 7:54 AM   I agree with the above.  I have seen and examiend the patient.  She received 2 U pRBC for acute blood loss anemia.   -f/u intra-op cx -repeat CBC in am -mobilize Vac change tomorrow  Annamarie Major

## 2020-01-12 LAB — GLUCOSE, CAPILLARY
Glucose-Capillary: 56 mg/dL — ABNORMAL LOW (ref 70–99)
Glucose-Capillary: 81 mg/dL (ref 70–99)
Glucose-Capillary: 118 mg/dL — ABNORMAL HIGH (ref 70–99)
Glucose-Capillary: 124 mg/dL — ABNORMAL HIGH (ref 70–99)

## 2020-01-12 MED ORDER — OXYCODONE-ACETAMINOPHEN 5-325 MG PO TABS: 1.0000 | ORAL_TABLET | Freq: Four times a day (QID) | ORAL | 0 refills | Status: DC | PRN

## 2020-01-12 NOTE — Progress Notes (Signed)
[facility] [place]  Subjective  - No complaints.   Objective (!) 122/55 83 97.8 F (36.6 C) (Oral) 18 98%  Intake/Output Summary (Last 24 hours) at 01/12/2020 1023 Last data filed at 01/12/2020 0734 Gross per 24 hour  Intake 600 ml  Output -  Net 600 ml    Right BK incision with vac - some blood gathering under vac Right DP palpable  Laboratory Lab Results: Recent Labs    01/11/20 0041 01/11/20 1026  WBC 22.2* 29.9*  HGB 5.9* 9.0*  HCT 20.1* 28.2*  PLT 548* 663*   BMET Recent Labs    01/10/20 1850 01/11/20 0041  NA 138 136  K 3.8 4.1  CL 100 100  CO2 25 24  GLUCOSE 152* 200*  BUN 26* 26*  CREATININE 1.67* 1.72*  CALCIUM 8.8* 8.3*    COAG Lab Results  Component Value Date   INR 1.1 12/13/2019   INR 1.09 10/24/2012   No results found for: PTT  Assessment/Planning:  S/P Washout of right BK pop incision for hematoma.  Will change vac today.  Awaiting home vac.  Will follow cultures.  Excellent palpable pulse in foot still with palpable DP.  [name] 01/12/2020 10:23 AM --

## 2020-01-12 NOTE — Progress Notes (Signed)
Patient CBG 56. Patient has no symptoms or complaints. O.J and graham crackers and peanut butter given. N.T. to recheck CBG

## 2020-01-12 NOTE — Progress Notes (Signed)
Physical Therapy Treatment Patient Details Name: Sara Warner MRN: [id number] DOB: [birth year] Today's Date: 01/12/2020    History of Present Illness Pt isa 79 y.o. female with recent R femoral to below knee pop bypass (12/15/19), now admitted 01/10/20 with significant bleeding; s/p exploration of bypass graft and application of wound VAC on 2/10. PMH includes PAD, HTN, DM, CKD III, CHF, afib, NICM.    PT Comments    Pt tolerated treatment well, ambulating and transferring steadily without physical assistance when utilizing RW. Pt will benefit from continued acute PT POC to improve ambulation tolerance and restore independence without the use of an assistive device.   Follow Up Recommendations  No PT follow up;Supervision for mobility/OOB     Equipment Recommendations  None recommended by PT    Recommendations for Other Services       Precautions / Restrictions Precautions Precautions: Fall;Other (comment) Precaution Comments: R lower leg wound vac Restrictions Weight Bearing Restrictions: Yes RLE Weight Bearing: Weight bearing as tolerated    Mobility  Bed Mobility Overal bed mobility: (pt received and left sitting in recliner)                Transfers Overall transfer level: Needs assistance Equipment used: Rolling walker (2 wheeled) Transfers: Sit to/from Stand Sit to Stand: Supervision            Ambulation/Gait Ambulation/Gait assistance: Supervision Gait Distance (Feet): 80 Feet Assistive device: Rolling walker (2 wheeled) Gait Pattern/deviations: Step-through pattern Gait velocity: slowed Gait velocity interpretation: 1.31 - 2.62 ft/sec, indicative of limited community ambulator General Gait Details: pt with slowed but steady step through gait, no significant LOB noted   Stairs             Wheelchair Mobility    Modified Rankin (Stroke Patients Only)       Balance Overall balance assessment: Needs assistance Sitting-balance  support: No upper extremity supported;Feet supported Sitting balance-Leahy Scale: Good Sitting balance - Comments: modI   Standing balance support: Single extremity supported Standing balance-Leahy Scale: Good Standing balance comment: supervision for dynamic standing, modI for static                            Cognition Arousal/Alertness: Awake/alert Behavior During Therapy: WFL for tasks assessed/performed Overall Cognitive Status: Within Functional Limits for tasks assessed                                        Exercises      General Comments General comments (skin integrity, edema, etc.): VSS on RA, reinforced education on LE edema management and need for elevation      Pertinent Vitals/Pain Pain Assessment: No/denies pain    Home Living                      Prior Function            PT Goals (current goals can now be found in the care plan section) Acute Rehab PT Goals Patient Stated Goal: home Progress towards PT goals: Progressing toward goals    Frequency    Min 3X/week      PT Plan Current plan remains appropriate    Co-evaluation              AM-PAC PT "6 Clicks" Mobility   Outcome Measure  Help needed turning from  your back to your side while in a flat bed without using bedrails?: None Help needed moving from lying on your back to sitting on the side of a flat bed without using bedrails?: None Help needed moving to and from a bed to a chair (including a wheelchair)?: None Help needed standing up from a chair using your arms (e.g., wheelchair or bedside chair)?: None Help needed to walk in hospital room?: None Help needed climbing 3-5 steps with a railing? : A Little 6 Click Score: 23    End of Session   Activity Tolerance: Patient tolerated treatment well Patient left: in chair;with call bell/phone within reach Nurse Communication: Mobility status PT Visit Diagnosis: Other abnormalities of gait and  mobility (R26.89)     Time: 1357-1410 PT Time Calculation (min) (ACUTE ONLY): 13 min  Charges:  $Gait Training: 8-22 mins                     Zenaida Niece, PT, DPT Acute Rehabilitation Pager: [phone]    Zenaida Niece 01/12/2020, 2:35 PM

## 2020-01-12 NOTE — Progress Notes (Signed)
Discharge instructions given to patient. IV removed, clean and intact. Medications and wound care/ wound vac reviewed. Patient supplies delivered to patient's room and all questions answered. Pt escorted home with daughter.  Arletta Bale, RN

## 2020-01-12 NOTE — TOC Transition Note (Signed)
Transition of Care [facility] - CM/SW Discharge Note Marvetta Gibbons RN,BSN Transitions of Care Unit 4NP (non trauma) - RN Case Manager [phone]   Patient Details  Name: Sara Warner MRN: [id number] Date of Birth: 1941-07-30  Transition of Care [facility]) CM/SW Contact:  Dawayne Patricia, RN Phone Number: 01/12/2020, 3:41 PM   Clinical Narrative:    Pt stable for transition home today pending [employer] wound approval- notified by Olivia Mackie with [employer] at 1400 pt has been approved and is pending delivery this afternoon- once delivered to the room- bedside RN can transition pt to home [facility] for discharge home. HHRN has been resumed with [facility] and they will see pt on 2/15 for first in home wound VAC drsg change. Orders have been placed. Have spoken with pt and daughter at bedside to update on home wound VAC approval.    Final next level of care: Home w Home Health Services Barriers to Discharge: Barriers Resolved, No Barriers Identified   Patient Goals and CMS Choice Patient states their goals for this hospitalization and ongoing recovery are:: return home get better CMS Medicare.gov Compare Post Acute Care list provided to:: Patient Choice offered to / list presented to : Patient  Discharge Placement                 Home with [facility]      Discharge Plan and Services   Discharge Planning Services: CM Consult Post Acute Care Choice: Durable Medical Equipment, Home Health, Resumption of Svcs/PTA Provider          DME Arranged: Vac DME Agency: [facility] Date DME Agency Contacted: 01/11/20 Time DME Agency Contacted: 1200 Representative spoke with at DME Agency: Olivia Mackie HH Arranged: RN [facility] Agency: [place] Date [place]: 01/11/20 Time [place]: 77 Representative spoke with at [place]: [place] ([place]) Interventions     Readmission Risk Interventions Readmission Risk Prevention Plan 01/12/2020  Transportation  Screening Complete  PCP or Specialist Appt within 3-5 Days Complete  HRI or [place] Complete  Social Work Consult for [place] Planning/Counseling Complete  Palliative Care Screening Not Applicable  Medication Review Press photographer) Complete  Some recent data might be hidden

## 2020-01-12 NOTE — Progress Notes (Signed)
CBG 81.  

## 2020-01-12 NOTE — Progress Notes (Signed)
Inpatient Diabetes Program Recommendations  AACE/ADA: New Consensus Statement on Inpatient Glycemic Control (2015)  Target Ranges:  Prepandial:   less than 140 mg/dL      Peak postprandial:   less than 180 mg/dL (1-2 hours)      Critically ill patients:  140 - 180 mg/dL   Lab Results  Component Value Date   GLUCAP 118 (H) 01/12/2020   HGBA1C 5.4 01/10/2020    Review of Glycemic Control Results for DARRIS, CARACHURE (MRN [id number]) as of 01/12/2020 11:27  Ref. Range 01/11/2020 21:11 01/12/2020 06:27 01/12/2020 06:50 01/12/2020 11:24  Glucose-Capillary Latest Ref Range: 70 - 99 mg/dL 175 (H) 56 (L) 81 118 (H)   Diabetes history: Type 2 Dm Outpatient Diabetes medications: Trulicity 6.75 mg Qwk, Januvia 50 mg QD, Glipizide 10 mg BID Current orders for Inpatient glycemic control: Glipizide 10 mg BID, Novolog 0-9 units TID, Trulicity 4.49 mg Qwk  Inpatient Diabetes Program Recommendations:    Noted A1C of 5.4% and home medications and FSBG of 56 mg/dL. With age and possible hypoglycemia at home, would recommend having patient follow up with PCP and would be guarded on Glipizide at discharge.  Thanks, Bronson Curb, MSN, RNC-OB Diabetes Coordinator [phone] (8a-5p)

## 2020-01-12 NOTE — Anesthesia Postprocedure Evaluation (Signed)
Anesthesia Post Note  Patient: Sara Warner  Procedure(s) Performed: right lower leg Wound Exploration with wound vac application (Right )     Patient location during evaluation: PACU Anesthesia Type: General Level of consciousness: awake and alert Pain management: pain level controlled Vital Signs Assessment: post-procedure vital signs reviewed and stable Respiratory status: spontaneous breathing, nonlabored ventilation, respiratory function stable and patient connected to nasal cannula oxygen Cardiovascular status: blood pressure returned to baseline and stable Postop Assessment: no apparent nausea or vomiting Anesthetic complications: no    Last Vitals:  Vitals:   01/12/20 0312 01/12/20 0733  BP: 138/69 (!) 110/49  Pulse: 77 83  Resp: 18 18  Temp: 36.6 C 36.6 C  SpO2: 94% 98%    Last Pain:  Vitals:   01/12/20 0800  TempSrc:   PainSc: 0-No pain                 Danetra Glock S

## 2020-01-14 LAB — TYPE AND SCREEN
ABO/RH(D): A POS
Antibody Screen: NEGATIVE
Unit division: 0
Unit division: 0
Unit division: 0

## 2020-01-14 LAB — BPAM RBC
Blood Product Expiration Date: 202103012359
Blood Product Expiration Date: 202103102359
Blood Product Expiration Date: 202103102359
ISSUE DATE / TIME: 202102110236
ISSUE DATE / TIME: 202102110608
Unit Type and Rh: 6200
Unit Type and Rh: 6200
Unit Type and Rh: 6200

## 2020-01-15 LAB — AEROBIC/ANAEROBIC CULTURE W GRAM STAIN (SURGICAL/DEEP WOUND)

## 2020-01-15 NOTE — Discharge Summary (Signed)
Discharge Summary  Patient ID: Sara Warner [id number] 78 y.o. 12-01-1940  Admit date: 01/10/2020  Discharge date and time: 01/12/2020  5:38 PM   Admitting Physician: Serafina Mitchell, MD   Discharge Physician: Monica Martinez, MD  Admission Diagnoses: Bleeding from wound [T14.8XXA] Infection of graft ([place]) [T85.79XA] PAD (peripheral artery disease) ([place]) [I73.9]  Discharge Diagnoses: same as above  Admission Condition: fair  Discharged Condition: good  Indication for Admission: Bleeding form left lower leg incision with hematoma  Hospital Course: The patient is a 79 year old female who had recently undergone right common femoral to below-knee popliteal bypass with greater saphenous vein graft secondary to critical limb ischemia.  She presented to the emergency department complaining of bleeding from her lower leg incision.  There was cloudy bloody discharge and concern for infection.  She was admitted and placed on broad spectrum antibiotics and counseled regarding need to explore the wound in the operating room.  She was taken to the operating room where she underwent exploration of recent right femoral to popliteal bypass graft, ligation of a bleeding venous branch and application of wound VAC.  She tolerated the procedure well.  Anaerobic and aerobic cultures were sent. On postoperative day 1, her vital signs were stable and she was afebrile.  She had bloody output from her VAC dressing.  She had Doppler dorsalis pedis and posterior tibial pulses.  Her right foot was warm. The patient completed PT and OT evaluations.  On postoperative day 2, her pain was controlled and she was tolerating her diet.  Right dorsalis pedis pulse was palpable and her wound VAC was in place with good seal.  The VAC dressing was changed and arrangements were made for a home wound VAC pump to be delivered.  The patient's daughter was present for the wound VAC change, wound care instructions and plans  for follow-up.   She was discharged home in satisfactory condition.  Consults: None  Treatments: surgery: Antibiotics, negative pressure wound dressing  Discharge Exam: The patient is alert and oriented x4 no apparent distress Vitals:   01/12/20 0923 01/12/20 1131  BP: (!) 122/55 125/65  Pulse:  79  Resp:  17  Temp:  97.6 F (36.4 C)  SpO2:  100%   Cardiac: Heart rate and rhythm are regular Lungs: Nonlabored breathing Incisions: Right groin incision healing without signs of infection.  Wound VAC in place in right lower leg with good seal.  The wound is clean without active bleeding Extremities: Palpable dorsalis pedis pulse.  Right foot is warm with active range of motion and intact sensation Abdomen: Soft Neurologic: Intact   Disposition: Discharge disposition: 01-Home or Self Care       Patient Instructions:  Allergies as of 01/12/2020      Reactions   Losartan Other (See Comments)   Patient reports nightmares/vivid dreams when taking.   Lisinopril Cough      Medication List    TAKE these medications   aspirin EC 81 MG tablet Take 1 tablet (81 mg total) by mouth daily.   atorvastatin 20 MG tablet Commonly known as: LIPITOR Take 1 tablet (20 mg total) by mouth daily at 6 PM.   clopidogrel 75 MG tablet Commonly known as: Plavix Take 1 tablet (75 mg total) by mouth daily.   clorazepate 7.5 MG tablet Commonly known as: Tranxene-T Take 1 tablet (7.5 mg total) by mouth 2 (two) times daily as needed for anxiety.   digoxin 0.125 MG tablet Commonly known as: LANOXIN Take 0.5  tablets (0.0625 mg total) by mouth every other day.   furosemide 20 MG tablet Commonly known as: LASIX TAKE 2 TABLETS BY MOUTH IN THE MORNING AND AN EXTRA TABLET IF GAIN 3 OR MORE POUNDS IN 1 DAY What changed:   how much to take  how to take this  when to take this  additional instructions   glipiZIDE 10 MG 24 hr tablet Commonly known as: GLUCOTROL XL Take 10 mg by mouth 2 (two)  times daily.   Januvia 50 MG tablet Generic drug: sitaGLIPtin Take 50 mg by mouth daily. Notes to patient: Take as you were at home.   metoprolol 200 MG 24 hr tablet Commonly known as: TOPROL-XL TAKE 1 TABLET BY MOUTH DAILY.   Tasigna 50 MG Caps Generic drug: Nilotinib HCl Take 100 mg by mouth 2 (two) times daily.   nilotinib 150 MG capsule Commonly known as: Tasigna Take 2 capsules (300 mg total) by mouth every 12 (twelve) hours. Take on an empty stomach, 1 hour before or 2 hours after meals.   oxyCODONE-acetaminophen 5-325 MG tablet Commonly known as: Percocet Take 1 tablet by mouth every 6 (six) hours as needed.   spironolactone 25 MG tablet Commonly known as: ALDACTONE Take 0.5 tablets (12.5 mg total) by mouth 2 (two) times daily. What changed: when to take this   sulfamethoxazole-trimethoprim 800-160 MG tablet Commonly known as: BACTRIM DS Take 1 tablet by mouth 2 (two) times daily for 10 days.   Synthroid 137 MCG tablet Generic drug: levothyroxine Take 137 mcg by mouth daily before breakfast.   Trulicity 1.01 BP/1.0CH Sopn Generic drug: Dulaglutide Inject 0.75 mg into the skin every Sunday. Notes to patient: Take as you were at home.      Activity: activity as tolerated Diet: diabetic diet Wound Care: Wound VAC dressing changes Mondays, Wednesdays and Fridays  Follow-up with Dr. Carlis Abbott in 2 weeks.  Signed: Barbie Banner, PA-C 01/15/2020 2:15 PM [facility] Office: [phone]

## 2020-01-16 ENCOUNTER — Ambulatory Visit: Payer: PPO | Admitting: Vascular Surgery

## 2020-01-19 ENCOUNTER — Telehealth: Payer: Self-pay

## 2020-01-19 NOTE — Telephone Encounter (Signed)
Returned pt's call. No answer; left voicemail.

## 2020-01-27 DIAGNOSIS — E1122 Type 2 diabetes mellitus with diabetic chronic kidney disease: Secondary | ICD-10-CM | POA: Diagnosis not present

## 2020-01-27 DIAGNOSIS — Z95820 Peripheral vascular angioplasty status with implants and grafts: Secondary | ICD-10-CM | POA: Diagnosis not present

## 2020-01-27 DIAGNOSIS — E0520 Thyrotoxicosis with toxic multinodular goiter without thyrotoxic crisis or storm: Secondary | ICD-10-CM | POA: Diagnosis not present

## 2020-01-27 DIAGNOSIS — I130 Hypertensive heart and chronic kidney disease with heart failure and stage 1 through stage 4 chronic kidney disease, or unspecified chronic kidney disease: Secondary | ICD-10-CM | POA: Diagnosis not present

## 2020-01-27 DIAGNOSIS — I0981 Rheumatic heart failure: Secondary | ICD-10-CM | POA: Diagnosis not present

## 2020-01-27 DIAGNOSIS — N183 Chronic kidney disease, stage 3 unspecified: Secondary | ICD-10-CM | POA: Diagnosis not present

## 2020-01-27 DIAGNOSIS — Z7984 Long term (current) use of oral hypoglycemic drugs: Secondary | ICD-10-CM | POA: Diagnosis not present

## 2020-01-27 DIAGNOSIS — I482 Chronic atrial fibrillation, unspecified: Secondary | ICD-10-CM | POA: Diagnosis not present

## 2020-01-27 DIAGNOSIS — Z48812 Encounter for surgical aftercare following surgery on the circulatory system: Secondary | ICD-10-CM | POA: Diagnosis not present

## 2020-01-27 DIAGNOSIS — Z87891 Personal history of nicotine dependence: Secondary | ICD-10-CM | POA: Diagnosis not present

## 2020-01-27 DIAGNOSIS — I5022 Chronic systolic (congestive) heart failure: Secondary | ICD-10-CM | POA: Diagnosis not present

## 2020-01-27 DIAGNOSIS — I081 Rheumatic disorders of both mitral and tricuspid valves: Secondary | ICD-10-CM | POA: Diagnosis not present

## 2020-01-27 DIAGNOSIS — Z8701 Personal history of pneumonia (recurrent): Secondary | ICD-10-CM | POA: Diagnosis not present

## 2020-01-27 DIAGNOSIS — Z79899 Other long term (current) drug therapy: Secondary | ICD-10-CM | POA: Diagnosis not present

## 2020-01-27 DIAGNOSIS — I428 Other cardiomyopathies: Secondary | ICD-10-CM | POA: Diagnosis not present

## 2020-01-27 DIAGNOSIS — E039 Hypothyroidism, unspecified: Secondary | ICD-10-CM | POA: Diagnosis not present

## 2020-01-27 DIAGNOSIS — C9210 Chronic myeloid leukemia, BCR/ABL-positive, not having achieved remission: Secondary | ICD-10-CM | POA: Diagnosis not present

## 2020-01-27 DIAGNOSIS — E1151 Type 2 diabetes mellitus with diabetic peripheral angiopathy without gangrene: Secondary | ICD-10-CM | POA: Diagnosis not present

## 2020-01-27 DIAGNOSIS — Z7902 Long term (current) use of antithrombotics/antiplatelets: Secondary | ICD-10-CM | POA: Diagnosis not present

## 2020-01-27 DIAGNOSIS — Z4801 Encounter for change or removal of surgical wound dressing: Secondary | ICD-10-CM | POA: Diagnosis not present

## 2020-01-27 DIAGNOSIS — I70201 Unspecified atherosclerosis of native arteries of extremities, right leg: Secondary | ICD-10-CM | POA: Diagnosis not present

## 2020-01-29 ENCOUNTER — Telehealth (HOSPITAL_COMMUNITY): Payer: Self-pay

## 2020-01-29 NOTE — Telephone Encounter (Signed)

## 2020-01-30 ENCOUNTER — Ambulatory Visit (INDEPENDENT_AMBULATORY_CARE_PROVIDER_SITE_OTHER): Payer: PPO | Admitting: Vascular Surgery

## 2020-01-30 ENCOUNTER — Other Ambulatory Visit: Payer: Self-pay

## 2020-01-30 ENCOUNTER — Encounter: Payer: Self-pay | Admitting: Vascular Surgery

## 2020-01-30 VITALS — BP 130/76 | HR 77 | Temp 97.1°F | Resp 16

## 2020-01-30 DIAGNOSIS — I739 Peripheral vascular disease, unspecified: Secondary | ICD-10-CM

## 2020-01-30 NOTE — Progress Notes (Signed)
Patient name: Sara Warner MRN: [id number] DOB: Feb 01, 1941 Sex: female  REASON FOR VISIT: Ongoing postop check after right leg bypass  HPI: Sara Warner is a 79 y.o. female that presents for ongoing postop check after right common femoral to below knee pop bypass on 12/15/2019 with great saphenous vein for critical limb ischemia with tissue loss.  She also underwent a right external iliac angioplasty with stent using several Lutonix stents on 12/06/2019 prior to her bypass.  Prior to surgery she had an ABI 0.2 with a right great toe ulcer.  She did require washout of the below-knee popliteal incision on 01/10/2020 with VAC placement.  She has been in a VAC at home and this wound is healing and gets changed 3 times a week with home health.  Rest pain remains resolved in the right foot.  All of her wounds have healed in the right foot.  Past Medical History:  Diagnosis Date  . Atrial fibrillation ([place])   . Chronic atrial fibrillation ([place])    a. Refuses [place].  CHA2DS2VASc = 4.  . Chronic systolic CHF (congestive heart failure) ([place])    a. 05/2015 Echo: EF 35-40%.  . CKD (chronic kidney disease), stage III   . 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 DM (diabetes mellitus) ([place])   . Dysrhythmia   . Elevated WBC count   . Hypertension   . Hypertensive heart disease   . Hypothyroidism   . NICM (nonischemic cardiomyopathy) ([place])    a. 06/2013 MV: low risk study w/o ischemia;  b. 05/2015 Echo: EF 35-40%, diff HK, basal-midinferoseptal and basal-midanteroseptal AK. Mild-mod MR, mod dil LA, mild to mod TR, PASP 19mmHg.  Marland Kitchen PAD (peripheral artery disease) ([place])   . Pneumonia    30 yrs. ago  . Toxic goiter     Past Surgical History:  Procedure Laterality Date  . ABDOMINAL AORTOGRAM W/LOWER EXTREMITY Bilateral 12/06/2019   Procedure: ABDOMINAL AORTOGRAM W/LOWER EXTREMITY;  Surgeon: [name], MD;  Location: [facility];  Service: Cardiovascular;  Laterality:  Bilateral;  . ABDOMINAL HYSTERECTOMY    . APPLICATION OF WOUND VAC  01/10/2020   BLEEDING FROM FEMORAL BYPASS  . CARDIOVERSION    . CYST EXCISION     removed on right ovary  . CYSTECTOMY    . FEMORAL ARTERY - POPLITEAL ARTERY BYPASS GRAFT  12/15/2019   Right common femoral artery to below-knee popliteal artery bypass with ipsilateral nonreversed great saphenous vein  . FEMORAL-POPLITEAL BYPASS GRAFT Right 12/15/2019   Procedure: BYPASS GRAFT FEMORAL-POPLITEAL ARTERY;  Surgeon: [name], MD;  Location: [facility];  Service: Vascular;  Laterality: Right;  . HYSTEROTOMY    . PERIPHERAL VASCULAR INTERVENTION Right 12/06/2019   Procedure: PERIPHERAL VASCULAR INTERVENTION;  Surgeon: [name], MD;  Location: [facility];  Service: Cardiovascular;  Laterality: Right;  EXT ILIAC  . WOUND EXPLORATION Right 01/10/2020   Procedure: right lower leg Wound Exploration with wound vac application;  Surgeon: Serafina Mitchell, MD;  Location: [facility];  Service: Vascular;  Laterality: Right;    Family History  Problem Relation Age of Onset  . Cancer - Colon Mother        mast to lungs  . Diabetes Father   . Diabetes Paternal Grandmother     SOCIAL HISTORY: Social History   Tobacco Use  . Smoking status: Former Smoker    Types: Cigarettes    Quit date: 01/01/1984    Years since quitting: 36.1  .  Smokeless tobacco: Never Used  Substance Use Topics  . Alcohol use: No    Allergies  Allergen Reactions  . Losartan Other (See Comments)    Patient reports nightmares/vivid dreams when taking.  . Lisinopril Cough    Current Outpatient Medications  Medication Sig Dispense Refill  . aspirin EC 81 MG tablet Take 1 tablet (81 mg total) by mouth daily.    Marland Kitchen atorvastatin (LIPITOR) 20 MG tablet Take 1 tablet (20 mg total) by mouth daily at 6 PM. 30 tablet 3  . clopidogrel (PLAVIX) 75 MG tablet Take 1 tablet (75 mg total) by mouth daily. 30 tablet 11  . clorazepate (TRANXENE-T) 7.5 MG  tablet Take 1 tablet (7.5 mg total) by mouth 2 (two) times daily as needed for anxiety. 30 tablet 1  . digoxin (LANOXIN) 0.125 MG tablet Take 0.5 tablets (0.0625 mg total) by mouth every other day. 45 tablet 1  . furosemide (LASIX) 20 MG tablet TAKE 2 TABLETS BY MOUTH IN THE MORNING AND AN EXTRA TABLET IF GAIN 3 OR MORE POUNDS IN 1 DAY (Patient taking differently: Take 20 mg by mouth 2 (two) times daily. ) 180 tablet 1  . glipiZIDE (GLUCOTROL XL) 10 MG 24 hr tablet Take 10 mg by mouth 2 (two) times daily.    . metoprolol (TOPROL-XL) 200 MG 24 hr tablet TAKE 1 TABLET BY MOUTH DAILY. (Patient taking differently: Take 200 mg by mouth daily. ) 90 tablet 2  . nilotinib (TASIGNA) 150 MG capsule Take 2 capsules (300 mg total) by mouth every 12 (twelve) hours. Take on an empty stomach, 1 hour before or 2 hours after meals. 120 capsule 2  . Nilotinib HCl (TASIGNA) 50 MG CAPS Take 100 mg by mouth 2 (two) times daily.    . sitaGLIPtin (JANUVIA) 50 MG tablet Take 50 mg by mouth daily.     Marland Kitchen spironolactone (ALDACTONE) 25 MG tablet Take 0.5 tablets (12.5 mg total) by mouth 2 (two) times daily. (Patient taking differently: Take 12.5 mg by mouth every evening. ) 90 tablet 3  . SYNTHROID 137 MCG tablet Take 137 mcg by mouth daily before breakfast.    . TRULICITY 9.67 EL/3.8BO SOPN Inject 0.75 mg into the skin every Sunday.     Marland Kitchen oxyCODONE-acetaminophen (PERCOCET) 5-325 MG tablet Take 1 tablet by mouth every 6 (six) hours as needed. (Patient not taking: Reported on 01/30/2020) 20 tablet 0   No current facility-administered medications for this visit.    REVIEW OF SYSTEMS:  [X]  denotes positive finding, [ ]  denotes negative finding Cardiac  Comments:  Chest pain or chest pressure:    Shortness of breath upon exertion:    Short of breath when lying flat:    Irregular heart rhythm:        Vascular    Pain in calf, thigh, or hip brought on by ambulation:    Pain in feet at night that wakes you up from your sleep:      Blood clot in your veins:    Leg swelling:         Pulmonary    Oxygen at home:    Productive cough:     Wheezing:         Neurologic    Sudden weakness in arms or legs:     Sudden numbness in arms or legs:     Sudden onset of difficulty speaking or slurred speech:    Temporary loss of vision in one eye:  Problems with dizziness:         Gastrointestinal    Blood in stool:     Vomited blood:         Genitourinary    Burning when urinating:     Blood in urine:        Psychiatric    Major depression:         Hematologic    Bleeding problems:    Problems with blood clotting too easily:        Skin    Rashes or ulcers:        Constitutional    Fever or chills:      PHYSICAL EXAM: Vitals:   01/30/20 1101  BP: 130/76  Pulse: 77  Resp: 16  Temp: (!) 97.1 F (36.2 C)  TempSrc: Temporal  SpO2: 100%    GENERAL: The patient is a well-nourished female, in no acute distress. The vital signs are documented above. CARDIAC: There is a regular rate and rhythm.  VASCULAR:  Right groin incision well-healed All of the right vein harvest incisions well-healed above the knee Right below-knee popliteal incision with good granulation tissue Palpable right DP pulse      DATA:   None  Assessment/Plan:  79 year old female status post right external iliac angioplasty and stent on 12/06/2019 followed by right common femoral to below-knee pop bypass with ipsilateral nonreversed saphenous vein on 12/15/2019 for critical limb ischemia with rest pain and tissue loss.  She did require washout of a right below-knee popliteal incision on 01/10/2020.  That is healing and looks excellent as pictured above.  Very pleased with her progress.  All of her rest pain and tissue loss is now resolved in the right foot.  Palpable right pedal pulse.  I will see her back in 2 weeks for another wound check.   [name], MD [facility] Office:  [phone]

## 2020-02-09 DIAGNOSIS — T8189XA Other complications of procedures, not elsewhere classified, initial encounter: Secondary | ICD-10-CM | POA: Diagnosis not present

## 2020-02-12 ENCOUNTER — Telehealth (HOSPITAL_COMMUNITY): Payer: Self-pay

## 2020-02-12 NOTE — Telephone Encounter (Signed)

## 2020-02-13 ENCOUNTER — Other Ambulatory Visit: Payer: Self-pay

## 2020-02-13 ENCOUNTER — Encounter: Payer: Self-pay | Admitting: Vascular Surgery

## 2020-02-13 ENCOUNTER — Ambulatory Visit (INDEPENDENT_AMBULATORY_CARE_PROVIDER_SITE_OTHER): Payer: Self-pay | Admitting: Vascular Surgery

## 2020-02-13 VITALS — BP 131/56 | HR 80 | Temp 97.3°F | Resp 14 | Ht 68.0 in | Wt 171.0 lb

## 2020-02-13 DIAGNOSIS — I739 Peripheral vascular disease, unspecified: Secondary | ICD-10-CM

## 2020-02-13 NOTE — Progress Notes (Signed)
Patient name: Sara Warner MRN: [id number] DOB: 12-29-1940 Sex: female  REASON FOR VISIT: Ongoing postop check after right leg bypass  HPI: Shivaun Bilello is a 79 y.o. female that presents for ongoing follow-up/wound check after right common femoral to below knee pop bypass on 12/15/2019 with great saphenous vein for critical limb ischemia with tissue loss.  She also underwent a right external iliac angioplasty with stent using several Lutonix stents on 12/06/2019 prior to her bypass.  Prior to surgery she had an ABI 0.2 with a right great toe ulcer.  She did require washout of the below-knee popliteal incision on 01/10/2020 with VAC placement after presenting to the ED.  This has been healing at home in a VAC dressing.  Her foot feels great with no rest pain.  Wounds are healing.  Past Medical History:  Diagnosis Date  . Atrial fibrillation ([place])   . Chronic atrial fibrillation ([place])    a. Refuses [place].  CHA2DS2VASc = 4.  . Chronic systolic CHF (congestive heart failure) ([place])    a. 05/2015 Echo: EF 35-40%.  . CKD (chronic kidney disease), stage III   . 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 DM (diabetes mellitus) ([place])   . Dysrhythmia   . Elevated WBC count   . Hypertension   . Hypertensive heart disease   . Hypothyroidism   . NICM (nonischemic cardiomyopathy) ([place])    a. 06/2013 MV: low risk study w/o ischemia;  b. 05/2015 Echo: EF 35-40%, diff HK, basal-midinferoseptal and basal-midanteroseptal AK. Mild-mod MR, mod dil LA, mild to mod TR, PASP 27mmHg.  Marland Kitchen PAD (peripheral artery disease) ([place])   . Pneumonia    30 yrs. ago  . Toxic goiter     Past Surgical History:  Procedure Laterality Date  . ABDOMINAL AORTOGRAM W/LOWER EXTREMITY Bilateral 12/06/2019   Procedure: ABDOMINAL AORTOGRAM W/LOWER EXTREMITY;  Surgeon: [name], MD;  Location: [facility];  Service: Cardiovascular;  Laterality: Bilateral;  . ABDOMINAL HYSTERECTOMY    . APPLICATION OF  WOUND VAC  01/10/2020   BLEEDING FROM FEMORAL BYPASS  . CARDIOVERSION    . CYST EXCISION     removed on right ovary  . CYSTECTOMY    . FEMORAL ARTERY - POPLITEAL ARTERY BYPASS GRAFT  12/15/2019   Right common femoral artery to below-knee popliteal artery bypass with ipsilateral nonreversed great saphenous vein  . FEMORAL-POPLITEAL BYPASS GRAFT Right 12/15/2019   Procedure: BYPASS GRAFT FEMORAL-POPLITEAL ARTERY;  Surgeon: [name], MD;  Location: [facility];  Service: Vascular;  Laterality: Right;  . HYSTEROTOMY    . PERIPHERAL VASCULAR INTERVENTION Right 12/06/2019   Procedure: PERIPHERAL VASCULAR INTERVENTION;  Surgeon: [name], MD;  Location: [facility];  Service: Cardiovascular;  Laterality: Right;  EXT ILIAC  . WOUND EXPLORATION Right 01/10/2020   Procedure: right lower leg Wound Exploration with wound vac application;  Surgeon: Serafina Mitchell, MD;  Location: [facility] OR;  Service: Vascular;  Laterality: Right;    Family History  Problem Relation Age of Onset  . Cancer - Colon Mother        mast to lungs  . Diabetes Father   . Diabetes Paternal Grandmother     SOCIAL HISTORY: Social History   Tobacco Use  . Smoking status: Former Smoker    Types: Cigarettes    Quit date: 01/01/1984    Years since quitting: 36.1  . Smokeless tobacco: Never Used  Substance Use Topics  . Alcohol use: No  Allergies  Allergen Reactions  . Losartan Other (See Comments)    Patient reports nightmares/vivid dreams when taking.  . Lisinopril Cough    Current Outpatient Medications  Medication Sig Dispense Refill  . aspirin EC 81 MG tablet Take 1 tablet (81 mg total) by mouth daily.    Marland Kitchen atorvastatin (LIPITOR) 20 MG tablet Take 1 tablet (20 mg total) by mouth daily at 6 PM. 30 tablet 3  . clopidogrel (PLAVIX) 75 MG tablet Take 1 tablet (75 mg total) by mouth daily. 30 tablet 11  . clorazepate (TRANXENE-T) 7.5 MG tablet Take 1 tablet (7.5 mg total) by mouth 2 (two) times  daily as needed for anxiety. 30 tablet 1  . digoxin (LANOXIN) 0.125 MG tablet Take 0.5 tablets (0.0625 mg total) by mouth every other day. 45 tablet 1  . furosemide (LASIX) 20 MG tablet TAKE 2 TABLETS BY MOUTH IN THE MORNING AND AN EXTRA TABLET IF GAIN 3 OR MORE POUNDS IN 1 DAY (Patient taking differently: Take 20 mg by mouth 2 (two) times daily. ) 180 tablet 1  . glipiZIDE (GLUCOTROL XL) 10 MG 24 hr tablet Take 10 mg by mouth 2 (two) times daily.    . metoprolol (TOPROL-XL) 200 MG 24 hr tablet TAKE 1 TABLET BY MOUTH DAILY. (Patient taking differently: Take 200 mg by mouth daily. ) 90 tablet 2  . nilotinib (TASIGNA) 150 MG capsule Take 2 capsules (300 mg total) by mouth every 12 (twelve) hours. Take on an empty stomach, 1 hour before or 2 hours after meals. 120 capsule 2  . Nilotinib HCl (TASIGNA) 50 MG CAPS Take 100 mg by mouth 2 (two) times daily.    . sitaGLIPtin (JANUVIA) 50 MG tablet Take 50 mg by mouth daily.     Marland Kitchen spironolactone (ALDACTONE) 25 MG tablet Take 0.5 tablets (12.5 mg total) by mouth 2 (two) times daily. (Patient taking differently: Take 12.5 mg by mouth every evening. ) 90 tablet 3  . SYNTHROID 137 MCG tablet Take 137 mcg by mouth daily before breakfast.    . TRULICITY 4.85 IO/2.7OJ SOPN Inject 0.75 mg into the skin every Sunday.     Marland Kitchen oxyCODONE-acetaminophen (PERCOCET) 5-325 MG tablet Take 1 tablet by mouth every 6 (six) hours as needed. (Patient not taking: Reported on 02/13/2020) 20 tablet 0   No current facility-administered medications for this visit.    REVIEW OF SYSTEMS:  [X]  denotes positive finding, [ ]  denotes negative finding Cardiac  Comments:  Chest pain or chest pressure:    Shortness of breath upon exertion:    Short of breath when lying flat:    Irregular heart rhythm:        Vascular    Pain in calf, thigh, or hip brought on by ambulation:    Pain in feet at night that wakes you up from your sleep:     Blood clot in your veins:    Leg swelling:           Pulmonary    Oxygen at home:    Productive cough:     Wheezing:         Neurologic    Sudden weakness in arms or legs:     Sudden numbness in arms or legs:     Sudden onset of difficulty speaking or slurred speech:    Temporary loss of vision in one eye:     Problems with dizziness:         Gastrointestinal  Blood in stool:     Vomited blood:         Genitourinary    Burning when urinating:     Blood in urine:        Psychiatric    Major depression:         Hematologic    Bleeding problems:    Problems with blood clotting too easily:        Skin    Rashes or ulcers:        Constitutional    Fever or chills:      PHYSICAL EXAM: Vitals:   02/13/20 1059  BP: (!) 131/56  Pulse: 80  Resp: 14  Temp: (!) 97.3 F (36.3 C)  TempSrc: Temporal  SpO2: 100%  Weight: 171 lb (77.6 kg)  Height: 5\' 8"  (1.727 m)    GENERAL: The patient is a well-nourished female, in no acute distress. The vital signs are documented above. CARDIAC: There is a regular rate and rhythm.  VASCULAR:  Right groin incision well-healed All of the right vein harvest incisions well-healed above the knee Right below-knee popliteal incision nearly healed Palpable right DP pulse        DATA:   None  Assessment/Plan:  79 year old female status post right external iliac angioplasty and stent on 12/06/2019 followed by right common femoral to below-knee pop bypass with ipsilateral nonreversed saphenous vein on 12/15/2019 for critical limb ischemia with rest pain and tissue loss.  She did require washout of a right below-knee popliteal incision on 01/10/2020.  This is nearly healed.   I think we can take her out of a VAC and switch her to wet-to-dry dressing.  I will arrange follow-up in 3 months with right aortoiliac duplex to perform surveillance of her iliac stents as well as right lower extremity bypass duplex for surveillance of the bypass.   [name], MD [facility] Office: [phone]

## 2020-02-14 ENCOUNTER — Other Ambulatory Visit: Payer: Self-pay | Admitting: *Deleted

## 2020-02-14 DIAGNOSIS — I739 Peripheral vascular disease, unspecified: Secondary | ICD-10-CM

## 2020-02-14 NOTE — Telephone Encounter (Signed)
Patient is approved for Tasigna at $0 from [employer] 02/14/20-11/29/20  [employer] phone [phone]  [facility] [place] Phone [phone] Fax [phone] 02/14/2020 2:40 PM

## 2020-02-15 ENCOUNTER — Telehealth: Payer: Self-pay

## 2020-02-15 NOTE — Telephone Encounter (Signed)
-----   Message from [name], MD sent at 02/14/2020  5:21 PM EDT ----- Regarding: RE: Wound Care Contact: [phone] I recommended wet to dry daily.  Aquacel AG would be ok as well.  Thanks,  Gerald Stabs ----- Message ----- From: Kaleen Mask, LPN Sent: 4/70/9628   3:55 PM EDT To: [name], MD Subject: [place].  Charlene, wound care nurse called wanting to know if we can give an order for Aqua-Cel AG applied to the wound bed until the small open wound heals?  Please advise.  Thanks again,  Thurston Hole., LPN

## 2020-02-15 NOTE — Telephone Encounter (Signed)
-----   Message from [name], MD sent at 02/14/2020  5:21 PM EDT ----- Regarding: RE: Wound Care Contact: [phone] I recommended wet to dry daily.  Aquacel AG would be ok as well.  Thanks,  Gerald Stabs ----- Message ----- From: Kaleen Mask, LPN Sent: 03/11/8785   3:55 PM EDT To: [name], MD Subject: [place].  Charlene, wound care nurse called wanting to know if we can give an order for Aqua-Cel AG applied to the wound bed until the small open wound heals?  Please advise.  Thanks again,  Thurston Hole., LPN

## 2020-02-26 ENCOUNTER — Other Ambulatory Visit: Payer: Self-pay

## 2020-02-26 ENCOUNTER — Ambulatory Visit (INDEPENDENT_AMBULATORY_CARE_PROVIDER_SITE_OTHER): Payer: PPO | Admitting: Podiatry

## 2020-02-26 DIAGNOSIS — L97521 Non-pressure chronic ulcer of other part of left foot limited to breakdown of skin: Secondary | ICD-10-CM

## 2020-02-26 DIAGNOSIS — Z48812 Encounter for surgical aftercare following surgery on the circulatory system: Secondary | ICD-10-CM | POA: Diagnosis not present

## 2020-02-26 DIAGNOSIS — E0520 Thyrotoxicosis with toxic multinodular goiter without thyrotoxic crisis or storm: Secondary | ICD-10-CM | POA: Diagnosis not present

## 2020-02-26 DIAGNOSIS — I428 Other cardiomyopathies: Secondary | ICD-10-CM | POA: Diagnosis not present

## 2020-02-26 DIAGNOSIS — Z7984 Long term (current) use of oral hypoglycemic drugs: Secondary | ICD-10-CM | POA: Diagnosis not present

## 2020-02-26 DIAGNOSIS — E1151 Type 2 diabetes mellitus with diabetic peripheral angiopathy without gangrene: Secondary | ICD-10-CM | POA: Diagnosis not present

## 2020-02-26 DIAGNOSIS — Z4801 Encounter for change or removal of surgical wound dressing: Secondary | ICD-10-CM | POA: Diagnosis not present

## 2020-02-26 DIAGNOSIS — E1142 Type 2 diabetes mellitus with diabetic polyneuropathy: Secondary | ICD-10-CM

## 2020-02-26 DIAGNOSIS — B351 Tinea unguium: Secondary | ICD-10-CM

## 2020-02-26 DIAGNOSIS — Z8701 Personal history of pneumonia (recurrent): Secondary | ICD-10-CM | POA: Diagnosis not present

## 2020-02-26 DIAGNOSIS — C9210 Chronic myeloid leukemia, BCR/ABL-positive, not having achieved remission: Secondary | ICD-10-CM | POA: Diagnosis not present

## 2020-02-26 DIAGNOSIS — Z87891 Personal history of nicotine dependence: Secondary | ICD-10-CM | POA: Diagnosis not present

## 2020-02-26 DIAGNOSIS — Z7902 Long term (current) use of antithrombotics/antiplatelets: Secondary | ICD-10-CM | POA: Diagnosis not present

## 2020-02-26 DIAGNOSIS — I0981 Rheumatic heart failure: Secondary | ICD-10-CM | POA: Diagnosis not present

## 2020-02-26 DIAGNOSIS — I081 Rheumatic disorders of both mitral and tricuspid valves: Secondary | ICD-10-CM | POA: Diagnosis not present

## 2020-02-26 DIAGNOSIS — Z95820 Peripheral vascular angioplasty status with implants and grafts: Secondary | ICD-10-CM | POA: Diagnosis not present

## 2020-02-26 DIAGNOSIS — I482 Chronic atrial fibrillation, unspecified: Secondary | ICD-10-CM | POA: Diagnosis not present

## 2020-02-26 DIAGNOSIS — I130 Hypertensive heart and chronic kidney disease with heart failure and stage 1 through stage 4 chronic kidney disease, or unspecified chronic kidney disease: Secondary | ICD-10-CM | POA: Diagnosis not present

## 2020-02-26 DIAGNOSIS — Z79899 Other long term (current) drug therapy: Secondary | ICD-10-CM | POA: Diagnosis not present

## 2020-02-26 DIAGNOSIS — E039 Hypothyroidism, unspecified: Secondary | ICD-10-CM | POA: Diagnosis not present

## 2020-02-26 DIAGNOSIS — I5022 Chronic systolic (congestive) heart failure: Secondary | ICD-10-CM | POA: Diagnosis not present

## 2020-02-26 DIAGNOSIS — N183 Chronic kidney disease, stage 3 unspecified: Secondary | ICD-10-CM | POA: Diagnosis not present

## 2020-02-26 DIAGNOSIS — E1122 Type 2 diabetes mellitus with diabetic chronic kidney disease: Secondary | ICD-10-CM | POA: Diagnosis not present

## 2020-02-26 DIAGNOSIS — I70201 Unspecified atherosclerosis of native arteries of extremities, right leg: Secondary | ICD-10-CM | POA: Diagnosis not present

## 2020-02-26 NOTE — Progress Notes (Signed)
  Subjective:  Patient ID: Sara Warner, female    DOB: 1941-06-21,  MRN: [id number]  Chief Complaint  Patient presents with  . Callouses    Lt sub met 1 callsu Pt.s tates," Dr. Cannon Kettle has been treating my callus but now it looks big and very thick" Tx: none -pt denies pain  . Nail Problem    Pt stats this morning she was digging and pulling skin from her Rt hallux toenails and made it bleed -pt denie [place] Tx: epsoms atl soaking     79 y.o. female presents for wound care. Hx confirmed with patient.  Objective:  Physical Exam: Wound Location: left submet 1 Wound Measurement: 0.4x0.3 post-debridement Wound Base: Granular/Healthy Peri-wound: Calloused, macerated Exudate: Scant/small amount Serosanguinous exudate wound without warmth, erythema, signs of acute infection  Right hallux nail with dried bleeding, onyhcolysis. Other nails dystrophic, elongated. Absent protective sensation  Assessment:   1. Ulcerated, foot, left, limited to breakdown of skin ([place])   2. DM type 2 with diabetic peripheral neuropathy ([place])   3. Onychomycosis      Plan:  Patient was evaluated and treated and all questions answered.  Ulcer Left 1submet 1 -Dressing applied consisting of povidone, band-aid -Wound cleansed and debrided  Procedure: Excisional Debridement of Wound Rationale: Removal of non-viable soft tissue from the wound to promote healing.  Anesthesia: none Pre-Debridement Wound Measurements: overlying hyperkeratosis   Post-Debridement Wound Measurements: 0.4 cm x 0.3 cm x 0.1 cm  Type of Debridement: Sharp Excisional Tissue Removed: Non-viable soft tissue Depth of Debridement: subcutaneous tissue. Technique: Sharp excisional debridement to bleeding, viable wound base.  Dressing: Dry, sterile, compression dressing. Disposition: Patient tolerated procedure well. Patient to return in 1 week for follow-up.  Onychomycosis -Nails palliatively debrided secondary to pain  Procedure:  Nail Debridement Rationale: Patient meets criteria for routine foot care due to DPN Type of Debridement: manual, sharp debridement. Instrumentation: Nail nipper, rotary burr. Number of Nails: 10  Return in about 1 week (around 03/04/2020).

## 2020-02-28 ENCOUNTER — Ambulatory Visit: Payer: PPO | Admitting: Sports Medicine

## 2020-03-08 ENCOUNTER — Other Ambulatory Visit: Payer: Self-pay | Admitting: Physician Assistant

## 2020-03-20 ENCOUNTER — Other Ambulatory Visit: Payer: Self-pay

## 2020-03-20 ENCOUNTER — Ambulatory Visit: Payer: PPO | Admitting: Sports Medicine

## 2020-03-20 ENCOUNTER — Encounter: Payer: Self-pay | Admitting: Sports Medicine

## 2020-03-20 DIAGNOSIS — L97521 Non-pressure chronic ulcer of other part of left foot limited to breakdown of skin: Secondary | ICD-10-CM

## 2020-03-20 DIAGNOSIS — S90415A Abrasion, left lesser toe(s), initial encounter: Secondary | ICD-10-CM

## 2020-03-20 DIAGNOSIS — E1142 Type 2 diabetes mellitus with diabetic polyneuropathy: Secondary | ICD-10-CM

## 2020-03-20 NOTE — Progress Notes (Signed)
Subjective: Sara Warner is a 79 y.o. female patient who presents to office for follow-up evaluation of wound that was on the bottom of her left foot reports that the wound has healed up and the soreness has went away is just a scab that has popped up now over her big toe joint and her left fourth toe of which she has been applying antibiotic cream to that she wants me to check.  Patient denies nausea vomiting fever chills or any other constitutional symptoms at this time.  Fasting blood sugar not recorded at today's visit.  No other issues noted.   Patient Active Problem List   Diagnosis Date Noted  . Infection of graft ([place]) 01/10/2020  . PAD (peripheral artery disease) ([place]) 12/15/2019  . Critical lower limb ischemia 11/28/2019  . Bilateral impacted cerumen 06/23/2017  . 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 NICM (nonischemic cardiomyopathy) ([place])   . Chronic atrial fibrillation ([place])   . Hypertensive heart disease   . Stage III chronic kidney disease 10/02/2015  . Controlled type 2 diabetes mellitus without complication ([place]) 92/09/9416  . 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 Atrial fibrillation ([place]) 01/05/2012  . Hypothyroid 01/05/2012  . Cardiomyopathy ([place]) 01/05/2012    Current Outpatient Medications on File Prior to Visit  Medication Sig Dispense Refill  . aspirin EC 81 MG tablet Take 1 tablet (81 mg total) by mouth daily.    Marland Kitchen atorvastatin (LIPITOR) 20 MG tablet TAKE 1 TABLET (20 MG TOTAL) BY MOUTH DAILY AT 6 PM. 90 tablet 1  . clopidogrel (PLAVIX) 75 MG tablet Take 1 tablet (75 mg total) by mouth daily. 30 tablet 11  . clorazepate (TRANXENE-T) 7.5 MG tablet Take 1 tablet (7.5 mg total) by mouth 2 (two) times daily as needed for anxiety. 30 tablet 1  . digoxin (LANOXIN) 0.125 MG tablet Take 0.5 tablets (0.0625 mg total) by mouth every other day. 45 tablet 1  . furosemide (LASIX) 20 MG tablet  TAKE 2 TABLETS BY MOUTH IN THE MORNING AND AN EXTRA TABLET IF GAIN 3 OR MORE POUNDS IN 1 DAY (Patient taking differently: Take 20 mg by mouth 2 (two) times daily. ) 180 tablet 1  . glipiZIDE (GLUCOTROL XL) 10 MG 24 hr tablet Take 10 mg by mouth 2 (two) times daily.    . metoprolol (TOPROL-XL) 200 MG 24 hr tablet TAKE 1 TABLET BY MOUTH DAILY. (Patient taking differently: Take 200 mg by mouth daily. ) 90 tablet 2  . nilotinib (TASIGNA) 150 MG capsule Take 2 capsules (300 mg total) by mouth every 12 (twelve) hours. Take on an empty stomach, 1 hour before or 2 hours after meals. 120 capsule 2  . Nilotinib HCl (TASIGNA) 50 MG CAPS Take 100 mg by mouth 2 (two) times daily.    Marland Kitchen oxyCODONE-acetaminophen (PERCOCET) 5-325 MG tablet Take 1 tablet by mouth every 6 (six) hours as needed. (Patient not taking: Reported on 02/13/2020) 20 tablet 0  . sitaGLIPtin (JANUVIA) 50 MG tablet Take 50 mg by mouth daily.     Marland Kitchen spironolactone (ALDACTONE) 25 MG tablet Take 0.5 tablets (12.5 mg total) by mouth 2 (two) times daily. (Patient taking differently: Take 12.5 mg by mouth every evening. ) 90 tablet 3  . SYNTHROID 137 MCG tablet Take 137 mcg by mouth daily before breakfast.    . SYNTHROID 150 MCG tablet Take 150 mcg by mouth daily.    . TRULICITY 4.08  MG/0.5ML SOPN Inject 0.75 mg into the skin every Sunday.      No current facility-administered medications on file prior to visit.    Allergies  Allergen Reactions  . Losartan Other (See Comments)    Patient reports nightmares/vivid dreams when taking.  . Lisinopril Cough    Objective:  General: Alert and oriented x3 in no acute distress  Dermatology: Callus submet 1 bilateral very minimal.  Dry scabbing/abrasion noted to the left first metatarsophalangeal joint and left fourth toe.  No active drainage.  No redness or warmth. No other acute signs of infection to these areas.  Vascular: Dorsalis Pedis and Posterior Tibial pedal pulses palpable 1/4, Capillary Fill  Time 5 seconds,(+) pedal hair growth bilateral, Trace edema bilateral lower extremities, Temperature gradient within normal limits.  Neurology: Johney Maine sensation intact via light touch bilateral, Protective diminished bilateral.  Musculoskeletal: No significant tenderness to palpation bilateral lower extremities.   Assessment and Plan: Problem List Items Addressed This Visit    None    Visit Diagnoses    Ulcerated, foot, left, limited to breakdown of skin ([place])    -  Primary   DM type 2 with diabetic peripheral neuropathy ([facility])       Abrasion of toe of left foot, initial encounter          -Complete examination performed -Discussed treatment options for now healed left foot ulcer and scabbing to left toes -Advised patient to apply small amount of Betadine to the small scabbed over abrasions to the first and fourth toe on the left allowing scab to slowly crusting fall off on its own -Advised patient to monitor closely for worsening symptoms or signs of infection if recurs to return to office sooner -Encouraged patient to continue to use walker or cane to assist with gait to prevent falls -Patient to return to office in 8-9 weeks for nail/callus care/abrasion and toe check or sooner if condition worsens.  Landis Martins, DPM

## 2020-03-27 DIAGNOSIS — I428 Other cardiomyopathies: Secondary | ICD-10-CM | POA: Diagnosis not present

## 2020-03-27 DIAGNOSIS — I0981 Rheumatic heart failure: Secondary | ICD-10-CM | POA: Diagnosis not present

## 2020-03-27 DIAGNOSIS — Z48812 Encounter for surgical aftercare following surgery on the circulatory system: Secondary | ICD-10-CM | POA: Diagnosis not present

## 2020-03-27 DIAGNOSIS — I130 Hypertensive heart and chronic kidney disease with heart failure and stage 1 through stage 4 chronic kidney disease, or unspecified chronic kidney disease: Secondary | ICD-10-CM | POA: Diagnosis not present

## 2020-03-27 DIAGNOSIS — I5022 Chronic systolic (congestive) heart failure: Secondary | ICD-10-CM | POA: Diagnosis not present

## 2020-03-27 DIAGNOSIS — E1151 Type 2 diabetes mellitus with diabetic peripheral angiopathy without gangrene: Secondary | ICD-10-CM | POA: Diagnosis not present

## 2020-03-27 DIAGNOSIS — Z8701 Personal history of pneumonia (recurrent): Secondary | ICD-10-CM | POA: Diagnosis not present

## 2020-03-27 DIAGNOSIS — E0520 Thyrotoxicosis with toxic multinodular goiter without thyrotoxic crisis or storm: Secondary | ICD-10-CM | POA: Diagnosis not present

## 2020-03-27 DIAGNOSIS — E1122 Type 2 diabetes mellitus with diabetic chronic kidney disease: Secondary | ICD-10-CM | POA: Diagnosis not present

## 2020-03-27 DIAGNOSIS — Z95820 Peripheral vascular angioplasty status with implants and grafts: Secondary | ICD-10-CM | POA: Diagnosis not present

## 2020-03-27 DIAGNOSIS — I482 Chronic atrial fibrillation, unspecified: Secondary | ICD-10-CM | POA: Diagnosis not present

## 2020-03-27 DIAGNOSIS — E039 Hypothyroidism, unspecified: Secondary | ICD-10-CM | POA: Diagnosis not present

## 2020-03-27 DIAGNOSIS — Z79899 Other long term (current) drug therapy: Secondary | ICD-10-CM | POA: Diagnosis not present

## 2020-03-27 DIAGNOSIS — I081 Rheumatic disorders of both mitral and tricuspid valves: Secondary | ICD-10-CM | POA: Diagnosis not present

## 2020-03-27 DIAGNOSIS — I70201 Unspecified atherosclerosis of native arteries of extremities, right leg: Secondary | ICD-10-CM | POA: Diagnosis not present

## 2020-03-27 DIAGNOSIS — C9210 Chronic myeloid leukemia, BCR/ABL-positive, not having achieved remission: Secondary | ICD-10-CM | POA: Diagnosis not present

## 2020-03-27 DIAGNOSIS — Z7984 Long term (current) use of oral hypoglycemic drugs: Secondary | ICD-10-CM | POA: Diagnosis not present

## 2020-03-27 DIAGNOSIS — Z4801 Encounter for change or removal of surgical wound dressing: Secondary | ICD-10-CM | POA: Diagnosis not present

## 2020-03-27 DIAGNOSIS — Z7902 Long term (current) use of antithrombotics/antiplatelets: Secondary | ICD-10-CM | POA: Diagnosis not present

## 2020-03-27 DIAGNOSIS — Z87891 Personal history of nicotine dependence: Secondary | ICD-10-CM | POA: Diagnosis not present

## 2020-03-27 DIAGNOSIS — N183 Chronic kidney disease, stage 3 unspecified: Secondary | ICD-10-CM | POA: Diagnosis not present

## 2020-04-01 ENCOUNTER — Other Ambulatory Visit: Payer: Self-pay | Admitting: Cardiovascular Disease

## 2020-04-02 DIAGNOSIS — E119 Type 2 diabetes mellitus without complications: Secondary | ICD-10-CM | POA: Diagnosis not present

## 2020-04-02 DIAGNOSIS — I739 Peripheral vascular disease, unspecified: Secondary | ICD-10-CM | POA: Diagnosis not present

## 2020-04-02 DIAGNOSIS — Z6827 Body mass index (BMI) 27.0-27.9, adult: Secondary | ICD-10-CM | POA: Diagnosis not present

## 2020-04-02 DIAGNOSIS — I4891 Unspecified atrial fibrillation: Secondary | ICD-10-CM | POA: Diagnosis not present

## 2020-04-02 DIAGNOSIS — Z9181 History of falling: Secondary | ICD-10-CM | POA: Diagnosis not present

## 2020-04-02 DIAGNOSIS — G629 Polyneuropathy, unspecified: Secondary | ICD-10-CM | POA: Diagnosis not present

## 2020-04-02 DIAGNOSIS — E890 Postprocedural hypothyroidism: Secondary | ICD-10-CM | POA: Diagnosis not present

## 2020-04-02 DIAGNOSIS — I10 Essential (primary) hypertension: Secondary | ICD-10-CM | POA: Diagnosis not present

## 2020-04-02 DIAGNOSIS — N183 Chronic kidney disease, stage 3 unspecified: Secondary | ICD-10-CM | POA: Diagnosis not present

## 2020-04-03 ENCOUNTER — Other Ambulatory Visit: Payer: Self-pay

## 2020-04-03 ENCOUNTER — Encounter (HOSPITAL_COMMUNITY): Payer: Self-pay | Admitting: Emergency Medicine

## 2020-04-03 ENCOUNTER — Inpatient Hospital Stay (HOSPITAL_COMMUNITY)
Admission: EM | Admit: 2020-04-03 | Discharge: 2020-04-08 | DRG: 812 | Disposition: A | Discharge status: Home / Self Care | Payer: PPO | Attending: Internal Medicine | Admitting: Internal Medicine

## 2020-04-03 DIAGNOSIS — N1832 Chronic kidney disease, stage 3b: Secondary | ICD-10-CM | POA: Diagnosis present

## 2020-04-03 DIAGNOSIS — Z79890 Hormone replacement therapy (postmenopausal): Secondary | ICD-10-CM

## 2020-04-03 DIAGNOSIS — E039 Hypothyroidism, unspecified: Secondary | ICD-10-CM | POA: Diagnosis present

## 2020-04-03 DIAGNOSIS — D62 Acute posthemorrhagic anemia: Principal | ICD-10-CM | POA: Diagnosis present

## 2020-04-03 DIAGNOSIS — Z79899 Other long term (current) drug therapy: Secondary | ICD-10-CM

## 2020-04-03 DIAGNOSIS — K648 Other hemorrhoids: Secondary | ICD-10-CM | POA: Diagnosis present

## 2020-04-03 DIAGNOSIS — Z7902 Long term (current) use of antithrombotics/antiplatelets: Secondary | ICD-10-CM

## 2020-04-03 DIAGNOSIS — K635 Polyp of colon: Secondary | ICD-10-CM | POA: Diagnosis present

## 2020-04-03 DIAGNOSIS — Z888 Allergy status to other drugs, medicaments and biological substances status: Secondary | ICD-10-CM

## 2020-04-03 DIAGNOSIS — K449 Diaphragmatic hernia without obstruction or gangrene: Secondary | ICD-10-CM | POA: Diagnosis present

## 2020-04-03 DIAGNOSIS — K5909 Other constipation: Secondary | ICD-10-CM | POA: Diagnosis present

## 2020-04-03 DIAGNOSIS — I130 Hypertensive heart and chronic kidney disease with heart failure and stage 1 through stage 4 chronic kidney disease, or unspecified chronic kidney disease: Secondary | ICD-10-CM | POA: Diagnosis present

## 2020-04-03 DIAGNOSIS — K2970 Gastritis, unspecified, without bleeding: Secondary | ICD-10-CM | POA: Diagnosis present

## 2020-04-03 DIAGNOSIS — Z20822 Contact with and (suspected) exposure to covid-19: Secondary | ICD-10-CM | POA: Diagnosis present

## 2020-04-03 DIAGNOSIS — Z95820 Peripheral vascular angioplasty status with implants and grafts: Secondary | ICD-10-CM

## 2020-04-03 DIAGNOSIS — I482 Chronic atrial fibrillation, unspecified: Secondary | ICD-10-CM | POA: Diagnosis present

## 2020-04-03 DIAGNOSIS — D125 Benign neoplasm of sigmoid colon: Secondary | ICD-10-CM | POA: Diagnosis not present

## 2020-04-03 DIAGNOSIS — D122 Benign neoplasm of ascending colon: Secondary | ICD-10-CM | POA: Diagnosis not present

## 2020-04-03 DIAGNOSIS — I5022 Chronic systolic (congestive) heart failure: Secondary | ICD-10-CM | POA: Diagnosis present

## 2020-04-03 DIAGNOSIS — K2990 Gastroduodenitis, unspecified, without bleeding: Secondary | ICD-10-CM | POA: Diagnosis present

## 2020-04-03 DIAGNOSIS — E1151 Type 2 diabetes mellitus with diabetic peripheral angiopathy without gangrene: Secondary | ICD-10-CM | POA: Diagnosis present

## 2020-04-03 DIAGNOSIS — Z90710 Acquired absence of both cervix and uterus: Secondary | ICD-10-CM

## 2020-04-03 DIAGNOSIS — Z8719 Personal history of other diseases of the digestive system: Secondary | ICD-10-CM

## 2020-04-03 DIAGNOSIS — K644 Residual hemorrhoidal skin tags: Secondary | ICD-10-CM | POA: Diagnosis present

## 2020-04-03 DIAGNOSIS — Z800 Family history of malignant neoplasm of digestive organs: Secondary | ICD-10-CM

## 2020-04-03 DIAGNOSIS — R040 Epistaxis: Secondary | ICD-10-CM | POA: Diagnosis present

## 2020-04-03 DIAGNOSIS — D649 Anemia, unspecified: Secondary | ICD-10-CM | POA: Diagnosis present

## 2020-04-03 DIAGNOSIS — R160 Hepatomegaly, not elsewhere classified: Secondary | ICD-10-CM

## 2020-04-03 DIAGNOSIS — C9210 Chronic myeloid leukemia, BCR/ABL-positive, not having achieved remission: Secondary | ICD-10-CM | POA: Diagnosis present

## 2020-04-03 DIAGNOSIS — K3189 Other diseases of stomach and duodenum: Secondary | ICD-10-CM | POA: Diagnosis not present

## 2020-04-03 DIAGNOSIS — R195 Other fecal abnormalities: Secondary | ICD-10-CM | POA: Diagnosis present

## 2020-04-03 DIAGNOSIS — Z0389 Encounter for observation for other suspected diseases and conditions ruled out: Secondary | ICD-10-CM | POA: Diagnosis not present

## 2020-04-03 DIAGNOSIS — Z833 Family history of diabetes mellitus: Secondary | ICD-10-CM

## 2020-04-03 DIAGNOSIS — I428 Other cardiomyopathies: Secondary | ICD-10-CM | POA: Diagnosis present

## 2020-04-03 DIAGNOSIS — Z87891 Personal history of nicotine dependence: Secondary | ICD-10-CM

## 2020-04-03 DIAGNOSIS — K5730 Diverticulosis of large intestine without perforation or abscess without bleeding: Secondary | ICD-10-CM | POA: Diagnosis present

## 2020-04-03 DIAGNOSIS — E785 Hyperlipidemia, unspecified: Secondary | ICD-10-CM | POA: Diagnosis present

## 2020-04-03 DIAGNOSIS — Z7982 Long term (current) use of aspirin: Secondary | ICD-10-CM

## 2020-04-03 DIAGNOSIS — Z7984 Long term (current) use of oral hypoglycemic drugs: Secondary | ICD-10-CM

## 2020-04-03 DIAGNOSIS — E1122 Type 2 diabetes mellitus with diabetic chronic kidney disease: Secondary | ICD-10-CM | POA: Diagnosis present

## 2020-04-03 DIAGNOSIS — K2950 Unspecified chronic gastritis without bleeding: Secondary | ICD-10-CM | POA: Diagnosis not present

## 2020-04-03 DIAGNOSIS — R0602 Shortness of breath: Secondary | ICD-10-CM | POA: Diagnosis not present

## 2020-04-03 HISTORY — DX: Epistaxis: R04.0

## 2020-04-03 LAB — URINALYSIS, ROUTINE W REFLEX MICROSCOPIC
Bacteria, UA: NONE SEEN
Bilirubin Urine: NEGATIVE
Glucose, UA: NEGATIVE mg/dL
Hgb urine dipstick: NEGATIVE
Ketones, ur: NEGATIVE mg/dL
Nitrite: NEGATIVE
Protein, ur: NEGATIVE mg/dL
Specific Gravity, Urine: 1.01 (ref 1.005–1.030)
pH: 5 (ref 5.0–8.0)

## 2020-04-03 LAB — RETICULOCYTES
Immature Retic Fract: 35.9 % — ABNORMAL HIGH (ref 2.3–15.9)
RBC.: 3.71 MIL/uL — ABNORMAL LOW (ref 3.87–5.11)
Retic Count, Absolute: 51.2 10*3/uL (ref 19.0–186.0)
Retic Ct Pct: 1.4 % (ref 0.4–3.1)

## 2020-04-03 LAB — HEPATIC FUNCTION PANEL
ALT: 14 U/L (ref 0–44)
AST: 14 U/L — ABNORMAL LOW (ref 15–41)
Albumin: 3.8 g/dL (ref 3.5–5.0)
Alkaline Phosphatase: 49 U/L (ref 38–126)
Bilirubin, Direct: <0.1 mg/dL (ref 0.0–0.2)
Total Bilirubin: 0.9 mg/dL (ref 0.3–1.2)
Total Protein: 6.8 g/dL (ref 6.5–8.1)

## 2020-04-03 LAB — BASIC METABOLIC PANEL
Anion gap: 11 (ref 5–15)
BUN: 25 mg/dL — ABNORMAL HIGH (ref 8–23)
CO2: 24 mmol/L (ref 22–32)
Calcium: 9 mg/dL (ref 8.9–10.3)
Chloride: 105 mmol/L (ref 98–111)
Creatinine, Ser: 1.67 mg/dL — ABNORMAL HIGH (ref 0.44–1.00)
GFR calc Af Amer: 34 mL/min — ABNORMAL LOW (ref >60)
GFR calc non Af Amer: 29 mL/min — ABNORMAL LOW (ref >60)
Glucose, Bld: 175 mg/dL — ABNORMAL HIGH (ref 70–99)
Potassium: 3.4 mmol/L — ABNORMAL LOW (ref 3.5–5.1)
Sodium: 140 mmol/L (ref 135–145)

## 2020-04-03 LAB — CBC
HCT: 25.1 % — ABNORMAL LOW (ref 36.0–46.0)
Hemoglobin: 7 g/dL — ABNORMAL LOW (ref 12.0–15.0)
MCH: 18.9 pg — ABNORMAL LOW (ref 26.0–34.0)
MCHC: 27.9 g/dL — ABNORMAL LOW (ref 30.0–36.0)
MCV: 67.7 fL — ABNORMAL LOW (ref 80.0–100.0)
Platelets: 594 10*3/uL — ABNORMAL HIGH (ref 150–400)
RBC: 3.71 MIL/uL — ABNORMAL LOW (ref 3.87–5.11)
RDW: 18.9 % — ABNORMAL HIGH (ref 11.5–15.5)
WBC: 13.1 10*3/uL — ABNORMAL HIGH (ref 4.0–10.5)
nRBC: 0.2 % (ref 0.0–0.2)

## 2020-04-03 LAB — SARS CORONAVIRUS 2 (TAT 6-24 HRS): SARS Coronavirus 2: NEGATIVE

## 2020-04-03 LAB — CBG MONITORING, ED: Glucose-Capillary: 86 mg/dL (ref 70–99)

## 2020-04-03 LAB — POC OCCULT BLOOD, ED: Fecal Occult Bld: POSITIVE — AB

## 2020-04-03 LAB — GLUCOSE, CAPILLARY: Glucose-Capillary: 163 mg/dL — ABNORMAL HIGH (ref 70–99)

## 2020-04-03 LAB — PREPARE RBC (CROSSMATCH)

## 2020-04-03 MED ORDER — FUROSEMIDE 20 MG PO TABS
20.0000 mg | ORAL_TABLET | Freq: Two times a day (BID) | ORAL | Status: DC
  Administered 2020-04-03 – 2020-04-07 (×9): 20 mg via ORAL
  Filled 2020-04-03 (×10): qty 1

## 2020-04-03 MED ORDER — INSULIN ASPART 100 UNIT/ML ~~LOC~~ SOLN: 0.0000 [IU] | Freq: Every day | SUBCUTANEOUS | Status: DC

## 2020-04-03 MED ORDER — SPIRONOLACTONE 12.5 MG HALF TABLET
12.5000 mg | ORAL_TABLET | Freq: Every evening | ORAL | Status: DC
  Administered 2020-04-03 – 2020-04-07 (×5): 12.5 mg via ORAL
  Filled 2020-04-03 (×7): qty 1

## 2020-04-03 MED ORDER — ATORVASTATIN CALCIUM 10 MG PO TABS
20.0000 mg | ORAL_TABLET | Freq: Every day | ORAL | Status: DC
  Administered 2020-04-03 – 2020-04-07 (×5): 20 mg via ORAL
  Filled 2020-04-03 (×6): qty 2

## 2020-04-03 MED ORDER — METOPROLOL SUCCINATE ER 100 MG PO TB24
200.0000 mg | ORAL_TABLET | Freq: Every day | ORAL | Status: DC
  Administered 2020-04-04 – 2020-04-08 (×5): 200 mg via ORAL
  Filled 2020-04-03 (×5): qty 2

## 2020-04-03 MED ORDER — LEVOTHYROXINE SODIUM 25 MCG PO TABS
137.0000 ug | ORAL_TABLET | Freq: Every day | ORAL | Status: DC
  Administered 2020-04-04: 137 ug via ORAL
  Filled 2020-04-03: qty 1

## 2020-04-03 MED ORDER — DIGOXIN 125 MCG PO TABS
0.0625 mg | ORAL_TABLET | ORAL | Status: DC
  Administered 2020-04-05 – 2020-04-07 (×2): 0.0625 mg via ORAL
  Filled 2020-04-03 (×3): qty 1

## 2020-04-03 MED ORDER — SODIUM CHLORIDE 0.9% IV SOLUTION: Freq: Once | INTRAVENOUS | Status: AC

## 2020-04-03 MED ORDER — POLYETHYLENE GLYCOL 3350 17 G PO PACK
17.0000 g | PACK | Freq: Every day | ORAL | Status: DC
  Administered 2020-04-03 – 2020-04-08 (×2): 17 g via ORAL
  Filled 2020-04-03 (×4): qty 1

## 2020-04-03 MED ORDER — INSULIN ASPART 100 UNIT/ML ~~LOC~~ SOLN
0.0000 [IU] | Freq: Three times a day (TID) | SUBCUTANEOUS | Status: DC
  Administered 2020-04-04 – 2020-04-07 (×5): 1 [IU] via SUBCUTANEOUS
  Administered 2020-04-08: 2 [IU] via SUBCUTANEOUS

## 2020-04-03 MED ORDER — SENNA 8.6 MG PO TABS
1.0000 | ORAL_TABLET | Freq: Two times a day (BID) | ORAL | Status: DC
  Administered 2020-04-03 – 2020-04-08 (×4): 8.6 mg via ORAL
  Filled 2020-04-03 (×8): qty 1

## 2020-04-03 NOTE — ED Notes (Signed)
Daughter at bedside.

## 2020-04-03 NOTE — ED Triage Notes (Signed)
Patient states she was sent by PCP for hemoglobin of 7. C/o weakness x 3 months. Patient alert and orientated x4. Denies any pain or dark stools.

## 2020-04-03 NOTE — ED Provider Notes (Signed)
[facility] EMERGENCY DEPARTMENT Provider Note   CSN: [id number] Arrival date & time: 04/03/20  1121     History Chief Complaint  Patient presents with  . Abnormal Lab    Sara Warner is a 79 y.o. female history of femoral bypass surgery in February of this year, complicated by symptomatic blood loss anemia requiring transfusions, chronic A. fib, on aspirin and Plavix, nonischemic cardiomyopathy, presented to emergency department with fatigue and weakness.  Patient reports feeling short of breath and generally weak progressively for the past several days or weeks.  She was called by her primary care doctor today and told that her hemoglobin is low on outpatient testing.  She was told to come to the ED.  She denies any visible active bleeding.  She says she has not noticed any blood in her stool or any black or tarry stools.  She denies chest pain.  HPI     Past Medical History:  Diagnosis Date  . Atrial fibrillation ([place])   . Chronic atrial fibrillation ([place])    a. Refuses [place].  CHA2DS2VASc = 4.  . Chronic systolic CHF (congestive heart failure) ([place])    a. 05/2015 Echo: EF 35-40%.  . CKD (chronic kidney disease), stage III   . 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 DM (diabetes mellitus) ([place])   . Dysrhythmia   . Elevated WBC count   . Hypertension   . Hypertensive heart disease   . Hypothyroidism   . NICM (nonischemic cardiomyopathy) ([place])    a. 06/2013 MV: low risk study w/o ischemia;  b. 05/2015 Echo: EF 35-40%, diff HK, basal-midinferoseptal and basal-midanteroseptal AK. Mild-mod MR, mod dil LA, mild to mod TR, PASP 13mmHg.  Marland Kitchen PAD (peripheral artery disease) ([place])   . Pneumonia    30 yrs. ago  . Toxic goiter     Patient Active Problem List   Diagnosis Date Noted  . Symptomatic anemia 04/03/2020  . Infection of graft ([place]) 01/10/2020  . PAD (peripheral artery disease) ([place]) 12/15/2019  . Critical lower limb ischemia 11/28/2019  .  Bilateral impacted cerumen 06/23/2017  . 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 NICM (nonischemic cardiomyopathy) ([place])   . Chronic atrial fibrillation ([place])   . Hypertensive heart disease   . Stage III chronic kidney disease 10/02/2015  . Controlled type 2 diabetes mellitus without complication ([place]) 75/64/3329  . 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 Atrial fibrillation ([place]) 01/05/2012  . Hypothyroid 01/05/2012  . Cardiomyopathy ([place]) 01/05/2012    Past Surgical History:  Procedure Laterality Date  . ABDOMINAL AORTOGRAM W/LOWER EXTREMITY Bilateral 12/06/2019   Procedure: ABDOMINAL AORTOGRAM W/LOWER EXTREMITY;  Surgeon: [name], MD;  Location: [facility];  Service: Cardiovascular;  Laterality: Bilateral;  . ABDOMINAL HYSTERECTOMY    . APPLICATION OF WOUND VAC  01/10/2020   BLEEDING FROM FEMORAL BYPASS  . CARDIOVERSION    . CYST EXCISION     removed on right ovary  . CYSTECTOMY    . FEMORAL ARTERY - POPLITEAL ARTERY BYPASS GRAFT  12/15/2019   Right common femoral artery to below-knee popliteal artery bypass with ipsilateral nonreversed great saphenous vein  . FEMORAL-POPLITEAL BYPASS GRAFT Right 12/15/2019   Procedure: BYPASS GRAFT FEMORAL-POPLITEAL ARTERY;  Surgeon: [name], MD;  Location: [facility];  Service: Vascular;  Laterality: Right;  . HYSTEROTOMY    . PERIPHERAL VASCULAR INTERVENTION Right 12/06/2019   Procedure: PERIPHERAL  VASCULAR INTERVENTION;  Surgeon: [name], MD;  Location: [facility];  Service: Cardiovascular;  Laterality: Right;  EXT ILIAC  . WOUND EXPLORATION Right 01/10/2020   Procedure: right lower leg Wound Exploration with wound vac application;  Surgeon: Serafina Mitchell, MD;  Location: [facility];  Service: Vascular;  Laterality: Right;     OB History   No obstetric history on file.     Family History  Problem Relation Age of  Onset  . Cancer - Colon Mother        mast to lungs  . Diabetes Father   . Diabetes Paternal Grandmother     Social History   Tobacco Use  . Smoking status: Former Smoker    Types: Cigarettes    Quit date: 01/01/1984    Years since quitting: 36.2  . Smokeless tobacco: Never Used  Substance Use Topics  . Alcohol use: No  . Drug use: Never    Home Medications Prior to Admission medications   Medication Sig Start Date End Date Taking? Authorizing Provider  aspirin EC 81 MG tablet Take 1 tablet (81 mg total) by mouth daily. 12/17/19  Yes Rhyne, Samantha J, PA-C  bisacodyl (DULCOLAX) 10 MG suppository Place 10 mg rectally as needed for moderate constipation.   Yes [provider]  clopidogrel (PLAVIX) 75 MG tablet Take 1 tablet (75 mg total) by mouth daily. 12/06/19 12/05/20 Yes [name], MD  clorazepate (TRANXENE-T) 7.5 MG tablet Take 1 tablet (7.5 mg total) by mouth 2 (two) times daily as needed for anxiety. Patient taking differently: Take 7.5 mg by mouth daily as needed for anxiety.  01/10/13  Yes Curcio, Roselie Awkward, NP  digoxin (LANOXIN) 0.125 MG tablet Take 0.5 tablets (0.0625 mg total) by mouth every other day. 10/30/19  Yes Troy Sine, MD  furosemide (LASIX) 20 MG tablet TAKE 2 TABLETS BY MOUTH IN THE MORNING AND AN EXTRA TABLET IF GAIN 3 OR MORE POUNDS IN 1 DAY Patient taking differently: Take 20 mg by mouth 2 (two) times daily.  01/09/20  Yes Troy Sine, MD  glipiZIDE (GLUCOTROL XL) 10 MG 24 hr tablet Take 10 mg by mouth daily.  07/07/14  Yes [provider]  metoprolol (TOPROL-XL) 200 MG 24 hr tablet TAKE 1 TABLET BY MOUTH DAILY. Patient taking differently: Take 200 mg by mouth daily.  09/25/19  Yes Troy Sine, MD  nilotinib (TASIGNA) 150 MG capsule Take 2 capsules (300 mg total) by mouth every 12 (twelve) hours. Take on an empty stomach, 1 hour before or 2 hours after meals. 07/07/19  Yes Wyatt Portela, MD  polyethylene glycol (MIRALAX /  GLYCOLAX) 17 g packet Take 17 g by mouth daily.   Yes [provider]  sitaGLIPtin (JANUVIA) 50 MG tablet Take 50 mg by mouth daily.  07/13/16  Yes [provider]  spironolactone (ALDACTONE) 25 MG tablet Take 0.5 tablets (12.5 mg total) by mouth 2 (two) times daily. Patient taking differently: Take 12.5 mg by mouth every evening.  01/24/18 11/27/20 Yes Troy Sine, MD  SYNTHROID 137 MCG tablet Take 137 mcg by mouth daily before breakfast. 11/15/19  Yes [provider]  atorvastatin (LIPITOR) 20 MG tablet TAKE 1 TABLET (20 MG TOTAL) BY MOUTH DAILY AT 6 PM. Patient not taking: Reported on 04/03/2020 03/08/20   Waynetta Sandy, MD  oxyCODONE-acetaminophen (PERCOCET) 5-325 MG tablet Take 1 tablet by mouth every 6 (six) hours as needed. Patient not taking:  Reported on 02/13/2020 01/12/20   Gabriel Earing, PA-C    Allergies    Losartan and Lisinopril  Review of Systems   Review of Systems  Constitutional: Positive for fatigue. Negative for chills and fever.  Eyes: Negative for photophobia and visual disturbance.  Respiratory: Positive for shortness of breath. Negative for cough.   Cardiovascular: Negative for chest pain and palpitations.  Gastrointestinal: Negative for abdominal pain, blood in stool, constipation and vomiting.  Genitourinary: Negative for dysuria and hematuria.  Skin: Negative for color change and rash.  Allergic/Immunologic: Negative for food allergies and immunocompromised state.  Neurological: Positive for light-headedness. Negative for syncope.  Psychiatric/Behavioral: Negative for agitation and confusion.  All other systems reviewed and are negative.   Physical Exam Updated Vital Signs BP (!) 151/81   Pulse 74   Temp 98 F (36.7 C) (Oral)   Resp 16   Ht 5\' 8"  (1.727 m)   Wt 78.5 kg   SpO2 100%   BMI 26.30 kg/m   Physical Exam Vitals and nursing note reviewed.  Constitutional:      General: She is not in acute distress.     Appearance: She is well-developed.  HENT:     Head: Normocephalic and atraumatic.  Eyes:     Conjunctiva/sclera: Conjunctivae normal.  Cardiovascular:     Rate and Rhythm: Normal rate and regular rhythm.     Pulses: Normal pulses.  Pulmonary:     Effort: Pulmonary effort is normal. No respiratory distress.     Breath sounds: Normal breath sounds.  Abdominal:     General: There is no distension.     Palpations: Abdomen is soft.     Tenderness: There is no abdominal tenderness. There is no guarding.  Genitourinary:    Comments: Rectal exam performed with female nurse chaperone present No gross melena Hemoccult positive Musculoskeletal:     Cervical back: Neck supple.     Comments: Right lower extremity vascular surgical scar appears well healed, non erythematous, no drainage  Skin:    General: Skin is warm and dry.  Neurological:     Mental Status: She is alert.  Psychiatric:        Mood and Affect: Mood normal.        Behavior: Behavior normal.     ED Results / Procedures / Treatments   Labs (all labs ordered are listed, but only abnormal results are displayed) Labs Reviewed  BASIC METABOLIC PANEL - Abnormal; Notable for the following components:      Result Value   Potassium 3.4 (*)    Glucose, Bld 175 (*)    BUN 25 (*)    Creatinine, Ser 1.67 (*)    GFR calc non Af Amer 29 (*)    GFR calc Af Amer 34 (*)    All other components within normal limits  CBC - Abnormal; Notable for the following components:   WBC 13.1 (*)    RBC 3.71 (*)    Hemoglobin 7.0 (*)    HCT 25.1 (*)    MCV 67.7 (*)    MCH 18.9 (*)    MCHC 27.9 (*)    RDW 18.9 (*)    Platelets 594 (*)    All other components within normal limits  POC OCCULT BLOOD, ED - Abnormal; Notable for the following components:   Fecal Occult Bld POSITIVE (*)    All other components within normal limits  SARS CORONAVIRUS 2 (TAT 6-24 HRS)  URINALYSIS, ROUTINE W REFLEX MICROSCOPIC  HEPATIC FUNCTION PANEL  IRON  AND TIBC  FERRITIN  RETICULOCYTES  CBC  BASIC METABOLIC PANEL  TYPE AND SCREEN  PREPARE RBC (CROSSMATCH)    EKG EKG Interpretation  Date/Time:  Wednesday Apr 03 2020 11:56:27 EDT Ventricular Rate:  74 PR Interval:    QRS Duration: 76 QT Interval:  374 QTC Calculation: 415 R Axis:   47 Text Interpretation: Atrial fibrillation Cannot rule out Anterior infarct , age undetermined Abnormal ECG No STEMI Confirmed by Octaviano Glow [phone]) on 04/03/2020 2:22:41 PM   Radiology No results found.  Procedures .Critical Care Performed by: Wyvonnia Dusky, MD Authorized by: Wyvonnia Dusky, MD   Critical care provider statement:    Critical care time (minutes):  40   Critical care was necessary to treat or prevent imminent or life-threatening deterioration of the following conditions:  Circulatory failure   Critical care was time spent personally by me on the following activities:  Discussions with consultants, evaluation of patient's response to treatment, examination of patient, ordering and performing treatments and interventions, ordering and review of laboratory studies, ordering and review of radiographic studies, pulse oximetry, re-evaluation of patient's condition, obtaining history from patient or surrogate and review of old charts Comments:     Symptomatic anemia requiring IV blood transfusion   (including critical care time)  Medications Ordered in ED Medications  0.9 %  sodium chloride infusion (Manually program via Guardrails IV Fluids) (has no administration in time range)  atorvastatin (LIPITOR) tablet 20 mg (has no administration in time range)  digoxin (LANOXIN) tablet 0.0625 mg (has no administration in time range)  metoprolol succinate (TOPROL-XL) 24 hr tablet 200 mg (has no administration in time range)  spironolactone (ALDACTONE) tablet 12.5 mg (has no administration in time range)  furosemide (LASIX) tablet 20 mg (has no administration in time range)   insulin aspart (novoLOG) injection 0-9 Units (has no administration in time range)  insulin aspart (novoLOG) injection 0-5 Units (has no administration in time range)  levothyroxine (SYNTHROID) tablet 137 mcg (has no administration in time range)  polyethylene glycol (MIRALAX / GLYCOLAX) packet 17 g (has no administration in time range)  senna (SENOKOT) tablet 8.6 mg (has no administration in time range)    ED Course  I have reviewed the triage vital signs and the nursing notes.  Pertinent labs & imaging results that were available during my care of the patient were reviewed by me and considered in my medical decision making (see chart for details).  79 yo female here as referral from PCP with concern for anemia on outpatient labs Feeling fatigued, SOB, lightheaded for several days Hx of anemia while in the hospital Feb for vascular procedure, received transfusions then  Here she is hemoccult positive Hemodynamically stable on telemetry ECG reviewed, no ischemic, doubtful of ACS at this time Labs ordered and reviewed with hgb 7.0, Cr near baseline Chart reviewed including recent hospitalizaiton, vascular procedures Additional history provided by patient's daughter at bedside  I suspect this is likely a GI bleed as the patient is on aspirin+ plavix after her procedure, hemoccult positive.  Likely a slow bleed, no evidence of massive bleed or hemorrhage in the ED.  Less likely symptomatic from ACS, PE, or sepsis based on this clinical presentaiton  Less likely continued bleeding from her operative site.  No palpable hematoma or bruising of the right lower extremity  Patient consented for blood transfusion.  2 units ordered.  Will admit to medicine - no indication for emergent  GI consult in the ED, but anticipate their involvement later during her stay.    On my reassessment the patient remained stable, no new complaints.  She was agreeable to staying in the hospital.  Clinical Course  as of Apr 03 1714  Wed Apr 03, 2020  1609 The patient was admitted to the internal medicine team, signout was given to IM doctor   [MT]    Clinical Course User Index [MT] Dyshon Philbin, Carola Rhine, MD    Final Clinical Impression(s) / ED Diagnoses Final diagnoses:  Symptomatic anemia    Rx / DC Orders ED Discharge Orders    None       Wyvonnia Dusky, MD 04/03/20 1715

## 2020-04-03 NOTE — H&P (Addendum)
Date: 04/03/2020               Patient Name:  Sara Warner MRN: [id number]  DOB: 1941-01-15 Age / Sex: 79 y.o., female   PCP: Cyndi Bender, PA-C         Medical Service: Internal Medicine Teaching Service         Attending Physician: Dr. Velna Ochs, MD    First Contact: Josepha Pigg, MS3 Pager: [place] [phone])  Second Contact: Sheppard Coil, MD, Mitzi Hansen Pager: [place] [phone])       After Hours (After 5p/  First Contact Pager: [phone]  weekends / holidays): Second Contact Pager: [phone]   Chief Complaint: Fatigue   History of Present Illness: 79 y.o. yo female w/ PMH significant for left femoral bypass surgery in February 2021 on aspirin and Plavix complicated by symptomatic blood loss anemia requiring transfusions, chronic A. fib, HFrEF (EF45-50% 01/2018), HTN, CKD stage III, and CML who presents today with fatigue and SOB on exertion. The patient was recently seen by her PCP yesterday, and was found to have a low Hgb of 7.0. Today, her PCP called her with the results  to let her know that she needed to come to the hospital for a blood transfusion.  Prior to her femoral bypass surgery in February 2021, the patient never had issues with low blood counts or GI bleeding. She was put on aspirin 81 mg daily and Plavix 75 mg daily. She states her last colonoscopy was roughly 15 years ago.  We do not have the results in our charts, but the patient states that a polyp was removed but otherwise had a normal colonoscopy with Dr. Melina Copa of [facility] in [place]. He denies having any fevers, vision changes, dizziness, chest pain, abdominal pain, nausea or vomiting, melena or hematochezia.  ED course: In the ED, EKG, FOBT, BMP, CBC, and serum glucose were ordered. FOBT was positive, WBC 13.1, RBC 3.71, Hgb 7.0, MCV 67.7, MCH 18.9, MCHC 27.9, RDW 18.9, platelets 594, glucose 175, Cr. 1.67, GFR 34. Otherwise, labs are unremarkable.  Meds: Current Meds  Medication Sig  . aspirin EC  81 MG tablet Take 1 tablet (81 mg total) by mouth daily.  . bisacodyl (DULCOLAX) 10 MG suppository Place 10 mg rectally as needed for moderate constipation.  . clopidogrel (PLAVIX) 75 MG tablet Take 1 tablet (75 mg total) by mouth daily.  . clorazepate (TRANXENE-T) 7.5 MG tablet Take 1 tablet (7.5 mg total) by mouth 2 (two) times daily as needed for anxiety. (Patient taking differently: Take 7.5 mg by mouth daily as needed for anxiety. )  . digoxin (LANOXIN) 0.125 MG tablet Take 0.5 tablets (0.0625 mg total) by mouth every other day.  . furosemide (LASIX) 20 MG tablet TAKE 2 TABLETS BY MOUTH IN THE MORNING AND AN EXTRA TABLET IF GAIN 3 OR MORE POUNDS IN 1 DAY (Patient taking differently: Take 20 mg by mouth 2 (two) times daily. )  . glipiZIDE (GLUCOTROL XL) 10 MG 24 hr tablet Take 10 mg by mouth daily.   . metoprolol (TOPROL-XL) 200 MG 24 hr tablet TAKE 1 TABLET BY MOUTH DAILY. (Patient taking differently: Take 200 mg by mouth daily. )  . nilotinib (TASIGNA) 150 MG capsule Take 2 capsules (300 mg total) by mouth every 12 (twelve) hours. Take on an empty stomach, 1 hour before or 2 hours after meals.  . polyethylene glycol (MIRALAX / GLYCOLAX) 17 g packet Take 17 g by mouth daily.  . sitaGLIPtin (  JANUVIA) 50 MG tablet Take 50 mg by mouth daily.   Marland Kitchen spironolactone (ALDACTONE) 25 MG tablet Take 0.5 tablets (12.5 mg total) by mouth 2 (two) times daily. (Patient taking differently: Take 12.5 mg by mouth every evening. )  . SYNTHROID 137 MCG tablet Take 137 mcg by mouth daily before breakfast.   Allergies: Allergies as of 04/03/2020 - Review Complete 04/03/2020  Allergen Reaction Noted  . Losartan Other (See Comments) 02/19/2017  . Lisinopril Cough 02/11/2017   Past Medical History:  Diagnosis Date  . Atrial fibrillation ([place])   . Chronic atrial fibrillation ([place])    a. Refuses [place].  CHA2DS2VASc = 4.  . Chronic systolic CHF (congestive heart failure) ([place])    a. 05/2015 Echo: EF 35-40%.  . CKD  (chronic kidney disease), stage III   . 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 DM (diabetes mellitus) ([place])   . Dysrhythmia   . Elevated WBC count   . Hypertension   . Hypertensive heart disease   . Hypothyroidism   . NICM (nonischemic cardiomyopathy) ([place])    a. 06/2013 MV: low risk study w/o ischemia;  b. 05/2015 Echo: EF 35-40%, diff HK, basal-midinferoseptal and basal-midanteroseptal AK. Mild-mod MR, mod dil LA, mild to mod TR, PASP 72mmHg.  Marland Kitchen PAD (peripheral artery disease) ([place])   . Pneumonia    30 yrs. ago  . Toxic goiter    Past Surgical History:  Procedure Laterality Date  . ABDOMINAL AORTOGRAM W/LOWER EXTREMITY Bilateral 12/06/2019   Procedure: ABDOMINAL AORTOGRAM W/LOWER EXTREMITY;  Surgeon: [name], MD;  Location: [facility];  Service: Cardiovascular;  Laterality: Bilateral;  . ABDOMINAL HYSTERECTOMY    . APPLICATION OF WOUND VAC  01/10/2020   BLEEDING FROM FEMORAL BYPASS  . CARDIOVERSION    . CYST EXCISION     removed on right ovary  . CYSTECTOMY    . FEMORAL ARTERY - POPLITEAL ARTERY BYPASS GRAFT  12/15/2019   Right common femoral artery to below-knee popliteal artery bypass with ipsilateral nonreversed great saphenous vein  . FEMORAL-POPLITEAL BYPASS GRAFT Right 12/15/2019   Procedure: BYPASS GRAFT FEMORAL-POPLITEAL ARTERY;  Surgeon: [name], MD;  Location: [facility];  Service: Vascular;  Laterality: Right;  . HYSTEROTOMY    . PERIPHERAL VASCULAR INTERVENTION Right 12/06/2019   Procedure: PERIPHERAL VASCULAR INTERVENTION;  Surgeon: [name], MD;  Location: [facility];  Service: Cardiovascular;  Laterality: Right;  EXT ILIAC  . WOUND EXPLORATION Right 01/10/2020   Procedure: right lower leg Wound Exploration with wound vac application;  Surgeon: Serafina Mitchell, MD;  Location: [facility];  Service: Vascular;  Laterality: Right;   Family History:  Family History  Problem Relation Age of Onset  . Cancer - Colon  Mother        mast to lungs  . Diabetes Father   . Diabetes Paternal Grandmother     Social History:  Social History   Tobacco Use  . Smoking status: Former Smoker    Types: Cigarettes    Quit date: 01/01/1984    Years since quitting: 36.2  . Smokeless tobacco: Never Used  Substance Use Topics  . Alcohol use: No  . Drug use: Never    Review of Systems: A complete ROS was negative except as per HPI.  Imaging: EKG: personally reviewed my interpretation is a-fib,  Physical Exam: Blood pressure (!) 151/81, pulse 74, temperature 98 F (36.7 C), temperature source Oral, resp. rate 16, height 5\' 8"  (1.727  m), weight 78.5 kg, SpO2 100 %.  Physical Exam Constitutional:      General: She is not in acute distress. HENT:     Head: Normocephalic and atraumatic.  Eyes:     Comments: Pale conjunctiva  Cardiovascular:     Rate and Rhythm: Normal rate and regular rhythm.     Pulses: Normal pulses.     Heart sounds: Normal heart sounds. No murmur. No friction rub. No gallop.   Pulmonary:     Effort: Pulmonary effort is normal. No respiratory distress.     Breath sounds: Normal breath sounds. No wheezing, rhonchi or rales.  Abdominal:     General: Bowel sounds are normal. There is no distension.     Palpations: Abdomen is soft. There is no mass.     Tenderness: There is no abdominal tenderness. There is no guarding.  Musculoskeletal:     Right lower leg: No edema.     Left lower leg: No edema.  Skin:    General: Skin is warm and dry.  Neurological:     General: No focal deficit present.     Mental Status: She is alert and oriented to person, place, and time.  Psychiatric:        Mood and Affect: Mood normal.    Assessment & Plan by Problem: Active Problems:   Symptomatic anemia  In summary, Ms. Silveria is a 79 year old female with a past medical history significant for left femoral bypass surgery in February 2021 on aspirin and Plavix complicated by symptomatic blood loss  anemia requiring transfusions, chronic A. fib, HFrEF (EF45-50% 01/2018), HTN, CKD stage III, and CML who presents today with symptomatic anemia after being referred to the ED by her PCP for Hgb of 7.0.  #Symptomatic anemia: Surgical history is significant for recent femoral artery bypass graft. Placed on aspirin and Plavix after surgery. Pt now has a Hgb of 7.0, receiving blood transfusion. Given positive FOBT and use of aspirin and Plavix, acute GI bleed is most likely diagnosis, however will obtain additional labs to evaluate for hemolytic or production causes. -GI consulted, appreciate recommendations  -Clear liquids, NPO at midnight. Will evaluate tomorrow -Hold home [place] and plavix for now. Will reach out to Vascular Surgery  -1u pRBC ordered -F/u H&H -Iron and TIBC, ferritin, reticulocytes ordered -Hepatic function panel ordered  #PAD s/p L femoral artery bypass graft 01/2020 #HFrEF (EF 45-50% 01/2018) #HTN -Metoprolol XL 200 mg daily -Digoxin 0.0625 mg every other day -Spironolactone 12.5 mg daily -Atorvastatin 20 mg daily -Lasix 20 mg BID  #CKD stage III: Patient's creatinine is stable at her baseline of 1.67 on admission. -Daily BMP  #T2DM: Patient takes Januvia 50 mg daily and glipizide 10 mg daily -Hold home Januvia and glipizide -SSI   #Hypothyroidism -Synthroid 137 mg daily    #CML -Continue home Nilotinib 300 mg daily  #FEN/GI: Consulted GI, recommended clear liquid diet now and NPO at midght due to potential procedure tomorrow. Reaching out to vascular surgery for recommendation on aspirin and Plavix -Diet: Clear liquids, NPO at midnight -Fluids: IV 0.9% NaCl 10 mL/hr -Miralax daily, senna tablet BID  #DVT prophylaxis -SCDs  #CODE STATUS: FULL  #Dispo: Admit patient to Inpatient with expected length of stay greater than 2 midnights. Prior to Admission Living Arrangement: Home Anticipated Discharge Location: Home Barriers to Discharge: None  Signed: Josepha Pigg, MS3 Pager: [phone] 04/03/2020,5:21 PM  Attestation for Student Documentation:  I personally was present and performed or re-performed the history,  physical exam and medical decision-making activities of this service and have verified that the service and findings are accurately documented in the student's note.  Earlene Plater, MD Internal Medicine, PGY1 Pager: [phone]  04/03/2020,5:46 PM

## 2020-04-03 NOTE — ED Notes (Signed)
Tele Dinner ordered 

## 2020-04-04 ENCOUNTER — Observation Stay (HOSPITAL_COMMUNITY): Payer: PPO

## 2020-04-04 DIAGNOSIS — K5909 Other constipation: Secondary | ICD-10-CM | POA: Diagnosis present

## 2020-04-04 DIAGNOSIS — R040 Epistaxis: Secondary | ICD-10-CM | POA: Diagnosis present

## 2020-04-04 DIAGNOSIS — I130 Hypertensive heart and chronic kidney disease with heart failure and stage 1 through stage 4 chronic kidney disease, or unspecified chronic kidney disease: Secondary | ICD-10-CM | POA: Diagnosis present

## 2020-04-04 DIAGNOSIS — R195 Other fecal abnormalities: Secondary | ICD-10-CM | POA: Diagnosis present

## 2020-04-04 DIAGNOSIS — K2970 Gastritis, unspecified, without bleeding: Secondary | ICD-10-CM | POA: Diagnosis present

## 2020-04-04 DIAGNOSIS — Z20822 Contact with and (suspected) exposure to covid-19: Secondary | ICD-10-CM | POA: Diagnosis present

## 2020-04-04 DIAGNOSIS — Z95820 Peripheral vascular angioplasty status with implants and grafts: Secondary | ICD-10-CM | POA: Diagnosis not present

## 2020-04-04 DIAGNOSIS — E039 Hypothyroidism, unspecified: Secondary | ICD-10-CM | POA: Diagnosis present

## 2020-04-04 DIAGNOSIS — C9210 Chronic myeloid leukemia, BCR/ABL-positive, not having achieved remission: Secondary | ICD-10-CM | POA: Diagnosis present

## 2020-04-04 DIAGNOSIS — E1151 Type 2 diabetes mellitus with diabetic peripheral angiopathy without gangrene: Secondary | ICD-10-CM | POA: Diagnosis present

## 2020-04-04 DIAGNOSIS — K635 Polyp of colon: Secondary | ICD-10-CM | POA: Diagnosis present

## 2020-04-04 DIAGNOSIS — I5022 Chronic systolic (congestive) heart failure: Secondary | ICD-10-CM | POA: Diagnosis present

## 2020-04-04 DIAGNOSIS — E785 Hyperlipidemia, unspecified: Secondary | ICD-10-CM | POA: Diagnosis present

## 2020-04-04 DIAGNOSIS — E1122 Type 2 diabetes mellitus with diabetic chronic kidney disease: Secondary | ICD-10-CM | POA: Diagnosis present

## 2020-04-04 DIAGNOSIS — Z7902 Long term (current) use of antithrombotics/antiplatelets: Secondary | ICD-10-CM

## 2020-04-04 DIAGNOSIS — D649 Anemia, unspecified: Secondary | ICD-10-CM

## 2020-04-04 DIAGNOSIS — K2990 Gastroduodenitis, unspecified, without bleeding: Secondary | ICD-10-CM | POA: Diagnosis present

## 2020-04-04 DIAGNOSIS — K644 Residual hemorrhoidal skin tags: Secondary | ICD-10-CM | POA: Diagnosis present

## 2020-04-04 DIAGNOSIS — K449 Diaphragmatic hernia without obstruction or gangrene: Secondary | ICD-10-CM | POA: Diagnosis present

## 2020-04-04 DIAGNOSIS — Z87891 Personal history of nicotine dependence: Secondary | ICD-10-CM | POA: Diagnosis not present

## 2020-04-04 DIAGNOSIS — K3189 Other diseases of stomach and duodenum: Secondary | ICD-10-CM | POA: Diagnosis not present

## 2020-04-04 DIAGNOSIS — K5730 Diverticulosis of large intestine without perforation or abscess without bleeding: Secondary | ICD-10-CM | POA: Diagnosis present

## 2020-04-04 DIAGNOSIS — N1832 Chronic kidney disease, stage 3b: Secondary | ICD-10-CM | POA: Diagnosis present

## 2020-04-04 DIAGNOSIS — D62 Acute posthemorrhagic anemia: Secondary | ICD-10-CM | POA: Diagnosis present

## 2020-04-04 DIAGNOSIS — K648 Other hemorrhoids: Secondary | ICD-10-CM | POA: Diagnosis present

## 2020-04-04 DIAGNOSIS — I428 Other cardiomyopathies: Secondary | ICD-10-CM | POA: Diagnosis present

## 2020-04-04 DIAGNOSIS — I482 Chronic atrial fibrillation, unspecified: Secondary | ICD-10-CM | POA: Diagnosis present

## 2020-04-04 LAB — CBC
HCT: 29.4 % — ABNORMAL LOW (ref 36.0–46.0)
Hemoglobin: 8.7 g/dL — ABNORMAL LOW (ref 12.0–15.0)
MCH: 20.6 pg — ABNORMAL LOW (ref 26.0–34.0)
MCHC: 29.6 g/dL — ABNORMAL LOW (ref 30.0–36.0)
MCV: 69.5 fL — ABNORMAL LOW (ref 80.0–100.0)
Platelets: 567 10*3/uL — ABNORMAL HIGH (ref 150–400)
RBC: 4.23 MIL/uL (ref 3.87–5.11)
RDW: 20.3 % — ABNORMAL HIGH (ref 11.5–15.5)
WBC: 16 10*3/uL — ABNORMAL HIGH (ref 4.0–10.5)
nRBC: 0 % (ref 0.0–0.2)

## 2020-04-04 LAB — BASIC METABOLIC PANEL
Anion gap: 9 (ref 5–15)
BUN: 31 mg/dL — ABNORMAL HIGH (ref 8–23)
CO2: 25 mmol/L (ref 22–32)
Calcium: 8.9 mg/dL (ref 8.9–10.3)
Chloride: 106 mmol/L (ref 98–111)
Creatinine, Ser: 1.44 mg/dL — ABNORMAL HIGH (ref 0.44–1.00)
GFR calc Af Amer: 40 mL/min — ABNORMAL LOW (ref >60)
GFR calc non Af Amer: 35 mL/min — ABNORMAL LOW (ref >60)
Glucose, Bld: 156 mg/dL — ABNORMAL HIGH (ref 70–99)
Potassium: 3.7 mmol/L (ref 3.5–5.1)
Sodium: 140 mmol/L (ref 135–145)

## 2020-04-04 LAB — IRON AND TIBC
Iron: 367 ug/dL — ABNORMAL HIGH (ref 28–170)
Saturation Ratios: 69 % — ABNORMAL HIGH (ref 10.4–31.8)
TIBC: 529 ug/dL — ABNORMAL HIGH (ref 250–450)
UIBC: 162 ug/dL

## 2020-04-04 LAB — FERRITIN: Ferritin: 11 ng/mL (ref 11–307)

## 2020-04-04 LAB — GLUCOSE, CAPILLARY
Glucose-Capillary: 116 mg/dL — ABNORMAL HIGH (ref 70–99)
Glucose-Capillary: 132 mg/dL — ABNORMAL HIGH (ref 70–99)
Glucose-Capillary: 141 mg/dL — ABNORMAL HIGH (ref 70–99)
Glucose-Capillary: 132 mg/dL — ABNORMAL HIGH (ref 70–99)

## 2020-04-04 LAB — TROPONIN I (HIGH SENSITIVITY): Troponin I (High Sensitivity): 8 ng/L (ref <18)

## 2020-04-04 MED ORDER — HYDROXYZINE HCL 25 MG PO TABS
25.0000 mg | ORAL_TABLET | Freq: Four times a day (QID) | ORAL | Status: DC | PRN
  Administered 2020-04-04: 25 mg via ORAL
  Filled 2020-04-04: qty 1

## 2020-04-04 MED ORDER — LEVOTHYROXINE SODIUM 100 MCG/5ML IV SOLN
75.0000 ug | Freq: Every day | INTRAVENOUS | Status: DC
  Administered 2020-04-04 – 2020-04-08 (×5): 75 ug via INTRAVENOUS
  Filled 2020-04-04 (×6): qty 5

## 2020-04-04 MED ORDER — PANTOPRAZOLE SODIUM 40 MG IV SOLR
40.0000 mg | Freq: Two times a day (BID) | INTRAVENOUS | Status: DC
  Administered 2020-04-04 – 2020-04-08 (×9): 40 mg via INTRAVENOUS
  Filled 2020-04-04 (×9): qty 40

## 2020-04-04 MED ORDER — PHENYLEPHRINE HCL 0.25 % NA SOLN
1.0000 | Freq: Once | NASAL | Status: AC
  Administered 2020-04-04: 1 via NASAL
  Filled 2020-04-04: qty 15

## 2020-04-04 NOTE — Progress Notes (Signed)
Pt continue to pass clots of blood from throat whiles nose also bleed; when RN packs nose it continue to soak through and clots follow once packing is removed. MD notified and MD asked about pt nasal spray. Pt given her ordered nasal spray and nose packed. MD to come assess pt. Princess Perna RN

## 2020-04-04 NOTE — Progress Notes (Signed)
Patient is able yo cough up  Big chunk of clot. She states that she feels better and the nosebleed has slow down at this time. Patient has pressure on her nostril at this time. Will continue to monitor patient.

## 2020-04-04 NOTE — Progress Notes (Signed)
Pt has had a nosebleed since writer came on shift; off going nurse stated "since they tested her for covid her nose had bleed on and off". Monitoring pt for weakness/dizziness as she ambulated with assistance to bathroom. No blood observed in urine. HGB presently 8.7. Monitoring continues.

## 2020-04-04 NOTE — Progress Notes (Signed)
   Subjective: Pt is a 79-yr-old woman with PMH significant for HTN, HLD, CML, Afib, CKD stage 3, HFrEF, and femoral bypass surgery in Feb 2021 who presented to the hospital with fatigue and SOB with exertion. Pt received a blood transfusion 04/03/2020. This morning, pt complained of spitting up and swallowing blood as well as intermittent nosebleeds. Denies chest pain and SOB.  Objective:  Vital signs in last 24 hours: Vitals:   04/03/20 1838 04/03/20 2328 04/04/20 0546 04/04/20 1041  BP: (!) 163/83 (!) 141/70 (!) 159/81 (!) 148/67  Pulse: 86 87 86 76  Resp: 18 18 18 20   Temp: 97.8 F (36.6 C) (!) 97.5 F (36.4 C) 98 F (36.7 C) 97.9 F (36.6 C)  TempSrc: Oral Oral Oral Oral  SpO2: 100% 98% 96% 97%  Weight:      Height:       General: Pt is well-appearing HEENT: Epistaxis in both nares Cardiac: Irregular rhythm. No murmurs, rubs, or gallops Respiratory: Lungs are CTA bilaterally. No wheezes, rales, or rhonchi Abdominal: Soft and non-tender with normoactive bowel sounds Neuro: Alert and oriented x3. No gross neurologic deficits noted  Extremities: spontaneously moving all extremities  Skin: Warm and dry. No erythema or rashes. Several seborrheic keratoses noted on back  Assessment/Plan:  Active Problems:   Symptomatic anemia   Epistaxis  Pt is a 79 year old female with a PMH significant for HTN, HLD, CML, Afib, CKD stage 3, HFrEF, and femoral bypass surgery in Feb 2021 who presented to the hospital with a Hgb of 7.0. Received blood transfusion 5/52021. Complains of epistaxis, currently managed with nose packing.  Symptomatic anemia: Pt arrived to the hospital with a Hgb of 7.0, RBC 3.71, MCV 67.7. She received 2 units of packed RBCs 04/03/2020. Today, Hgb is improved at 8.7, RBC 4.23, MCV 6.95. She denies chest pain and SOB. Per GI note, pt has near melenic stool after she takes Miralax. Given positive FOBT, colonoscopy is indicated to assess for GI bleed. -Hold aspirin and  Plavix -EGD +/-colonoscopy after Plavix washout -Iron ordered -Monitor CBC  Epistaxis: Epistaxis began after COVID-19 nasopharyngeal swab done 04/03/2020. Gauze soaked in phenylephrine nasal spray used for nose packing. -Leave nasal packing in for today. Will reassess tomorrow  HFrEF (EF 45-50% 01/2018) HTN: -Metoprolol XL 200 mg QD -Digoxin 0.0625 mg every other day -Spironolactone 12.5 mg QD -Lasix 20 mg BID  HLD: -Atorvastatin 20 mg QD  CKD stage III: Pt's creatinine is stable at her baseline of 1.67 on admission. -Daily BMP  T2DM: Last glucose 156. Pt takes Januvia 50 mg daily and glipizide 10 mg daily at home. -Hold home Januvia and glipizide -SSI   Hypothyroidism: -IV levothyroxine 75 mg QD   CML: -Continue home Nilotinib 300 mg daily  FEN/GI: -Diet: Clear liquids -Fluids: IV 0.9% NaCl 10 mL/hr -Miralax QD, senna tablet BID  DVT prophylaxis: -SCDs  CODE STATUS: FULL  Prior to Admission Living Arrangement: Home Anticipated Discharge Location: Home Barriers to Discharge: None Dispo: Anticipated discharge greater than 2 midnights  Josepha Pigg, Medical Student 04/04/2020, 3:54 PM Pager: [phone]

## 2020-04-04 NOTE — Progress Notes (Addendum)
Patient seen at the bedside after being paged for intermittent nosebleeding throughout the day.  Earlier, the patient asked if we could pack her nose.  The patient received 1 dose of nasal spray in bilateral nares of phenylephrine.  With the aid of the medical student, we soaked some gauze in phenylephrine then placed the gauze in each nare and packed it with a Q-tip.  The patient reported that she was comfortable, and was not experiencing pain throughout the packing.  Each nare was packed to the patient's satisfaction.  Plan: -Leave nasal packing in for today.  Will reassess tomorrow.  Earlene Plater, MD Internal Medicine, PGY1 Pager: [phone]  04/04/2020,3:58 PM

## 2020-04-04 NOTE — Progress Notes (Signed)
Patient pulled her nose pack out half way, she states she has blood clot in her throat and she tries to cough it out without success. Patient is sticking her finger in her throat and she finally unpacked her nose. Patient is not cooperating at this time. Will continue to monitor patient.

## 2020-04-04 NOTE — Progress Notes (Signed)
Patient's nostrils are packed wit gauze soak with phenylephrine by Dr. Sheppard Coil. Patient tolerates procedure well. Will continue to monitor patient.

## 2020-04-04 NOTE — Progress Notes (Signed)
Notified Dr. Sheppard Coil that it out of the scope of RN to pack nose. MD recommend putting pressure on the nose. RN informed MD that patient has been doing that since yesterday and it is not working. He states he is going  To come see the patient.

## 2020-04-04 NOTE — Progress Notes (Signed)
Paged Dr. Sheppard Coil at [phone] without answer.

## 2020-04-04 NOTE — Progress Notes (Addendum)
Observed pt spit out a large bright red blood clot as she blew her nose. MD paged.

## 2020-04-04 NOTE — Consult Note (Signed)
Referring Provider:  Dr. Sheppard Coil Primary Care Physician:  Cyndi Bender, PA-C Primary Gastroenterologist:  Dr. Melina Copa in [place]  Reason for Consultation:  Symptomatic Anemia   HPI: Sara Warner is a 79 y.o. female with a past medical history of hypertension, atrial fibrillation on Plavix and ASA, systolic CHF, nonischemic cardiomyopathy, DM II, CKD, CML, hypothyroidism and peripheral arterial disease.  She is status post right common femoral to below-knee popliteal bypass with greater saphenous vein graft secondary to critical limb ischemia on 12/15/2019. She developed post operative bleeding  from her graft site which required hospital admission with exploration of the right fem-pop bypass graft. Ligation of the bleeding venous branch was performed  by Dr. Trula Slade on 01/10/2020.  Her hemoglobin dropped from 9.0 -> 7.0 -> 5.9.  She received 2 units of packed red blood cells on 01/11/2020.  Her Hg level was 9.0 at the time of discharge home 01/12/2020.  Packs and aspirin were restarted.  She denies ever having any further bleeding from her graft site.  She continued to have fatigue which progressively worsened since her initial surgery 12/15/2019. She was seen by her PCP 5/4 due to having fatigue for the past 3 months.  Laboratory studies showed a hemoglobin level of 7 and she was sent directly to [facility] ED on 04/04/2020.  Her hemoglobin in the ED was 8.7. She complains of having fatigue for the past 2-1/2 months.  She intermittently has a good day with improved energy levels.  She denies having any dysphagia, heartburn or upper abdominal pain.  No nausea or vomiting.  No hematemesis.  Liver, she is having nosebleeds since having the COVID-19 nasal swab done yesterday in the ED.  She has a history of CML and denies prior history of epistaxis.  She has chronic constipation.  She takes MiraLAX approximately 3 days weekly.  When she takes the MiraLAX she notices her stool is loose and black in  color.  She denies having any bright red rectal bleeding.  She took MiraLAX earlier this morning and she reported passing loose black stool.  Nursing tech is present and reported her loose stool appeared brown in color.  Her last dose of Plavix was taken on 5/05 at 10 AM.  She takes aspirin 81 mg daily.  No other NSAID use. She underwent a colonoscopy approximately 10 years ago by Dr. Melina Copa in [place].  She reported the colonoscopy identified a few benign polyps which were removed.  Her mother died from colon cancer with metastasis in her mid 66s.  No fever, sweats or chills.  No significant weight loss.  Her daughter is present.  Laboratory studies 04/03/2020: Sodium 140.  Potassium 3.4.  Glucose 175.  BUN 25.  Creatinine 1.67.  Calcium 9.0.  Anion gap 11.  WBC 13.1.  RBC 3.71.  Hemoglobin 7.0.  Hematocrit 25.1.  MCV 67.7.  Platelet 594.  Reticulocyte count 1.4.  Urinalysis small amount of  Leukocytes.  SARS coronavirus 2 negative.  FOBT positive.  Laboratory studies 04/04/2020: Sodium 140.  Potassium 3.7.  BUN 31.  Creatinine 1.44.  UBC 16.0.  Hemoglobin 8.7.  Hematocrit 29.4.  MCV 69.5.  Platelet 567.  Iron 367.  TIBC 529.  Saturation ratio 69.  Ferritin 11.  Past Medical History:  Diagnosis Date  . Atrial fibrillation ([place])   . Chronic atrial fibrillation ([place])    a. Refuses [place].  CHA2DS2VASc = 4.  . Chronic systolic CHF (congestive heart failure) ([place])    a. 05/2015  Echo: EF 35-40%.  . CKD (chronic kidney disease), stage III   . 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 DM (diabetes mellitus) ([place])   . Dysrhythmia   . Elevated WBC count   . Hypertension   . Hypertensive heart disease   . Hypothyroidism   . NICM (nonischemic cardiomyopathy) ([place])    a. 06/2013 MV: low risk study w/o ischemia;  b. 05/2015 Echo: EF 35-40%, diff HK, basal-midinferoseptal and basal-midanteroseptal AK. Mild-mod MR, mod dil LA, mild to mod TR, PASP 64mmHg.  Marland Kitchen PAD (peripheral artery disease) ([place])   .  Pneumonia    30 yrs. ago  . Toxic goiter     Past Surgical History:  Procedure Laterality Date  . ABDOMINAL AORTOGRAM W/LOWER EXTREMITY Bilateral 12/06/2019   Procedure: ABDOMINAL AORTOGRAM W/LOWER EXTREMITY;  Surgeon: [name], MD;  Location: [facility];  Service: Cardiovascular;  Laterality: Bilateral;  . ABDOMINAL HYSTERECTOMY    . APPLICATION OF WOUND VAC  01/10/2020   BLEEDING FROM FEMORAL BYPASS  . CARDIOVERSION    . CYST EXCISION     removed on right ovary  . CYSTECTOMY    . FEMORAL ARTERY - POPLITEAL ARTERY BYPASS GRAFT  12/15/2019   Right common femoral artery to below-knee popliteal artery bypass with ipsilateral nonreversed great saphenous vein  . FEMORAL-POPLITEAL BYPASS GRAFT Right 12/15/2019   Procedure: BYPASS GRAFT FEMORAL-POPLITEAL ARTERY;  Surgeon: [name], MD;  Location: [facility];  Service: Vascular;  Laterality: Right;  . HYSTEROTOMY    . PERIPHERAL VASCULAR INTERVENTION Right 12/06/2019   Procedure: PERIPHERAL VASCULAR INTERVENTION;  Surgeon: [name], MD;  Location: [facility];  Service: Cardiovascular;  Laterality: Right;  EXT ILIAC  . WOUND EXPLORATION Right 01/10/2020   Procedure: right lower leg Wound Exploration with wound vac application;  Surgeon: Serafina Mitchell, MD;  Location: [facility];  Service: Vascular;  Laterality: Right;    Prior to Admission medications   Medication Sig Start Date End Date Taking? Authorizing Provider  aspirin EC 81 MG tablet Take 1 tablet (81 mg total) by mouth daily. 12/17/19  Yes Rhyne, Samantha J, PA-C  bisacodyl (DULCOLAX) 10 MG suppository Place 10 mg rectally as needed for moderate constipation.   Yes [provider]  clopidogrel (PLAVIX) 75 MG tablet Take 1 tablet (75 mg total) by mouth daily. 12/06/19 12/05/20 Yes [name], MD  clorazepate (TRANXENE-T) 7.5 MG tablet Take 1 tablet (7.5 mg total) by mouth 2 (two) times daily as needed for anxiety. Patient taking  differently: Take 7.5 mg by mouth daily as needed for anxiety.  01/10/13  Yes Curcio, Roselie Awkward, NP  digoxin (LANOXIN) 0.125 MG tablet Take 0.5 tablets (0.0625 mg total) by mouth every other day. 10/30/19  Yes Troy Sine, MD  furosemide (LASIX) 20 MG tablet TAKE 2 TABLETS BY MOUTH IN THE MORNING AND AN EXTRA TABLET IF GAIN 3 OR MORE POUNDS IN 1 DAY Patient taking differently: Take 20 mg by mouth 2 (two) times daily.  01/09/20  Yes Troy Sine, MD  glipiZIDE (GLUCOTROL XL) 10 MG 24 hr tablet Take 10 mg by mouth daily.  07/07/14  Yes [provider]  metoprolol (TOPROL-XL) 200 MG 24 hr tablet TAKE 1 TABLET BY MOUTH DAILY. Patient taking differently: Take 200 mg by mouth daily.  09/25/19  Yes Troy Sine, MD  nilotinib (TASIGNA) 150 MG capsule Take 2 capsules (300 mg total) by mouth every 12 (twelve) hours. Take  on an empty stomach, 1 hour before or 2 hours after meals. 07/07/19  Yes Wyatt Portela, MD  polyethylene glycol (MIRALAX / GLYCOLAX) 17 g packet Take 17 g by mouth daily.   Yes [provider]  sitaGLIPtin (JANUVIA) 50 MG tablet Take 50 mg by mouth daily.  07/13/16  Yes [provider]  spironolactone (ALDACTONE) 25 MG tablet Take 0.5 tablets (12.5 mg total) by mouth 2 (two) times daily. Patient taking differently: Take 12.5 mg by mouth every evening.  01/24/18 11/27/20 Yes Troy Sine, MD  SYNTHROID 137 MCG tablet Take 137 mcg by mouth daily before breakfast. 11/15/19  Yes [provider]  atorvastatin (LIPITOR) 20 MG tablet TAKE 1 TABLET (20 MG TOTAL) BY MOUTH DAILY AT 6 PM. Patient not taking: Reported on 04/03/2020 03/08/20   Waynetta Sandy, MD  oxyCODONE-acetaminophen (PERCOCET) 5-325 MG tablet Take 1 tablet by mouth every 6 (six) hours as needed. Patient not taking: Reported on 02/13/2020 01/12/20   Gabriel Earing, PA-C    Current Facility-Administered Medications  Medication Dose Route Frequency Provider Last Rate Last Admin  .  atorvastatin (LIPITOR) tablet 20 mg  20 mg Oral q1800 Earlene Plater, MD   20 mg at 04/03/20 1832  . [START ON 04/05/2020] digoxin (LANOXIN) tablet 0.0625 mg  0.0625 mg Oral Angelica Ran, MD      . furosemide (LASIX) tablet 20 mg  20 mg Oral BID Earlene Plater, MD   Stopped at 04/04/20 0720  . insulin aspart (novoLOG) injection 0-5 Units  0-5 Units Subcutaneous QHS Earlene Plater, MD      . insulin aspart (novoLOG) injection 0-9 Units  0-9 Units Subcutaneous TID WC Earlene Plater, MD      . levothyroxine (SYNTHROID) tablet 137 mcg  137 mcg Oral Q0600 Earlene Plater, MD   137 mcg at 04/04/20 0559  . metoprolol succinate (TOPROL-XL) 24 hr tablet 200 mg  200 mg Oral Daily Earlene Plater, MD      . polyethylene glycol [facility] Floria Raveling) packet 17 g  17 g Oral Daily Earlene Plater, MD   17 g at 04/03/20 1721  . senna (SENOKOT) tablet 8.6 mg  1 tablet Oral BID Earlene Plater, MD   8.6 mg at 04/03/20 2210  . spironolactone (ALDACTONE) tablet 12.5 mg  12.5 mg Oral QPM Earlene Plater, MD   12.5 mg at 04/03/20 2004    Allergies as of 04/03/2020 - Review Complete 04/03/2020  Allergen Reaction Noted  . Losartan Other (See Comments) 02/19/2017  . Lisinopril Cough 02/11/2017    Family History  Problem Relation Age of Onset  . Cancer - Colon Mother        mast to lungs  . Diabetes Father   . Diabetes Paternal Grandmother     Social History   Socioeconomic History  . Marital status: Single    Spouse name: Not on file  . Number of children: Not on file  . Years of education: Not on file  . Highest education level: Not on file  Occupational History  . Not on file  Tobacco Use  . Smoking status: Former Smoker    Types: Cigarettes    Quit date: 01/01/1984    Years since quitting: 36.2  . Smokeless tobacco: Never Used  Substance and Sexual Activity  . Alcohol use: No  . Drug use: Never  . Sexual activity: Not on file  Other Topics Concern  . Not on file    Social History Narrative  .  Not on file   Social Determinants of Health   Financial Resource Strain:   . Difficulty of Paying Living Expenses:   Food Insecurity:   . Worried About Charity fundraiser in the Last Year:   . Arboriculturist in the Last Year:   Transportation Needs:   . Film/video editor (Medical):   Marland Kitchen Lack of Transportation (Non-Medical):   Physical Activity:   . Days of Exercise per Week:   . Minutes of Exercise per Session:   Stress:   . Feeling of Stress :   Social Connections:   . Frequency of Communication with Friends and Family:   . Frequency of Social Gatherings with Friends and Family:   . Attends Religious Services:   . Active Member of Clubs or Organizations:   . Attends Archivist Meetings:   Marland Kitchen Marital Status:   Intimate Partner Violence:   . Fear of Current or Ex-Partner:   . Emotionally Abused:   Marland Kitchen Physically Abused:   . Sexually Abused:     Review of Systems: Gen: Denies fever, sweats or chills. No weight loss.  CV: Denies chest pain, palpitations or edema. Resp: + Nosebleed since Covid swab testing performed on 04/04/2020.  Denies cough, shortness of breath of hemoptysis.  GI: See HPI. GU : Denies urinary burning, blood in urine, increased urinary frequency or incontinence. MS: Denies joint pain, muscles aches or weakness. Derm: Denies rash, itchiness, skin lesions or unhealing ulcers. Psych: Denies depression, anxiety, memory loss, suicidal ideation or confusion. Heme: Denies easy bruising, bleeding. Neuro:  Denies headaches, dizziness or paresthesias. Endo:  Denies any problems with DM, thyroid or adrenal function.  Physical Exam: Vital signs in last 24 hours: Temp:  [97.4 F (36.3 C)-98.4 F (36.9 C)] 98 F (36.7 C) (05/06 0546) Pulse Rate:  [55-87] 86 (05/06 0546) Resp:  [12-22] 18 (05/06 0546) BP: (126-163)/(49-83) 159/81 (05/06 0546) SpO2:  [96 %-100 %] 96 % (05/06 0546) Weight:  [78.5 kg] 78.5 kg (05/05  1151) Last BM Date: 04/02/20 General:  Alert fatigued appearing 79 year old female in no acute distress. Head:  Normocephalic and atraumatic. Eyes:  No scleral icterus. Conjunctiva pink. Ears:  Normal auditory acuity. Nose:  No deformity, discharge or lesions. Mouth: Scattered missing dentition. No ulcers or lesions.  Neck:  Supple. No lymphadenopathy or thyromegaly.  Lungs: Breath sounds clear throughout. Heart: Regular rhythm, no murmur. Abdomen: Soft, nondistended.  Nontender.  Firm area to the medial right upper quadrant may represent enlargement of the left hepatic lobe.  Positive bowel sounds all 4 quadrants. Rectal: No external hemorrhoids.  Stool dark loose brown grossly heme positive. Musculoskeletal:  Symmetrical without gross deformities.  Pulses:  Normal pulses noted. Extremities:  Without clubbing or edema. Neurologic:  Alert and  oriented x4. No focal deficits.  Skin:  Intact without significant lesions or rashes. Psych:  Alert and cooperative. Normal mood and affect.  Intake/Output from previous day: 05/05 0701 - 05/06 0700 In: 555 [P.O.:240; Blood:315] Out: -  Intake/Output this shift: No intake/output data recorded.  Lab Results: Recent Labs    04/03/20 1200 04/04/20 0140  WBC 13.1* 16.0*  HGB 7.0* 8.7*  HCT 25.1* 29.4*  PLT 594* 567*   BMET Recent Labs    04/03/20 1200 04/04/20 0140  NA 140 140  K 3.4* 3.7  CL 105 106  CO2 24 25  GLUCOSE 175* 156*  BUN 25* 31*  CREATININE 1.67* 1.44*  CALCIUM 9.0 8.9   LFT  Recent Labs    04/03/20 1800  PROT 6.8  ALBUMIN 3.8  AST 14*  ALT 14  ALKPHOS 49  BILITOT 0.9  BILIDIR <0.1  IBILI NOT CALCULATED   PT/INR No results for input(s): LABPROT, INR in the last 72 hours. Hepatitis Panel No results for input(s): HEPBSAG, HCVAB, HEPAIGM, HEPBIGM in the last 72 hours.    Studies/Results: No results found.  IMPRESSION/PLAN:  57.  79 year old female complex past medical history including CML, atrial  fibrillation on aspirin and Plavix, peripheral arterial disease s/p right common femoral to below-knee popliteal bypass 12/15/2019 with post operative incisional bleeding resulting in acute anemia requiring 2 units of packed red blood cells 01/10/2010. She was admitted to the hospital 04/03/2020 with acute anemia.  Hemoglobin 7.0. She received 2 units of  PRBCs 5/5. Today Hg 8.7. BUN 25 -> 31. FOBT positive. Near melenic stools after she takes Miralax.  -Clear liquids -Hold Plavix -EGD +/-colonoscopy after Plavix washout -Transfuse for Hg < 8 -Follow H/H closely  -Protonix 40mg  IV bid  -RUQ abdominal sonogram  -CBC, CMP and PT/INR in am.  2. Epistaxis (after Covid 19 nasopharyngeal swab done 5/5) -Management per the hospitalist   3. History of colon polyps. Family (mother with history of colon cancer)  4. Atrial fibrillation.  Systolic CHF. Nonischemic cardiomyopathy -Hold Plavix   5. CKD. BUN 31. Cr. 1.44.   6. DM II  Further recommendations per Dr. Adline Peals Dorathy Daft  04/04/2020, 10:07 AM

## 2020-04-04 NOTE — Progress Notes (Signed)
Patient still bleeding through her nose and spit out bright red blood. Will continue to monitor patient.

## 2020-04-04 NOTE — Progress Notes (Signed)
Internal Medicine Attending Note:  Patient with on going intermittent nose bleed after COVID-19 swab yesterday. She reports history of nosebleed requiring cauterization. Otherwise reports feeling well.   Blood pressure (!) 148/67, pulse 76, temperature 97.9 F (36.6 C), temperature source Oral, resp. rate 20, height 5\' 8"  (1.727 m), weight 78.5 kg, SpO2 97 %.  Physical Exam Constitutional: NAD, appears comfortable HEENT: +epistaxis  Cardiovascular: Irregular rhythm, no murmurs, rubs, or gallops.  Pulmonary/Chest: CTAB, no wheezes, rales, or rhonchi.  Abdominal: Soft, non tender, non distended. +BS.  Extremities: Warm and well perfused. No edema.   Assessment & Plan:  Patient is a 79 yo F with HTN, HLD, CKD stage IIIb, HFrEF, CML on nilotinib, and PVD s/p left fem-pop bypass surgery in February of 2021 here with acute on chronic symptomatic anemia in the setting of DAPT. Please see my H&P attestation from earlier today. She is asymptomatic this morning.  Appreciate GI assistance. Plan for colonoscopy after plavix washout, continue holding ASA and plavix. For her nosebleed, Dr. Sheppard Coil will pack her nose. If bleeding continues, will discuss with ENT.   Velna Ochs, MD 04/04/2020, 3:18 PM

## 2020-04-04 NOTE — Progress Notes (Signed)
Called Dr. Sheppard Coil about patient nose bleed at this time and request for diet and Klonopin. Awaiting on MD at this time. Will continue to monitoir patient.

## 2020-04-04 NOTE — Progress Notes (Signed)
Pt still awake and asking for something to drink stating "I keep swallowing bits of blood". Writer did not see any active bleeding coming from mouth or nose presently as well as reminding pt that she should be nothing by mouth in anticipation of a procedure in AM. Attempt made to turn off tv to help with rest as well as turning down lights. Pt asked to keep tv on and "I'll be ok with it on". Monitoring continues.

## 2020-04-04 NOTE — Plan of Care (Signed)
  Problem: Clinical Measurements: Goal: Ability to maintain clinical measurements within normal limits will improve Outcome: Progressing Goal: Diagnostic test results will improve Outcome: Progressing Goal: Respiratory complications will improve Outcome: Progressing   

## 2020-04-05 ENCOUNTER — Encounter (HOSPITAL_COMMUNITY): Payer: Self-pay | Admitting: Internal Medicine

## 2020-04-05 DIAGNOSIS — R040 Epistaxis: Secondary | ICD-10-CM

## 2020-04-05 LAB — HEMOGLOBIN AND HEMATOCRIT, BLOOD
HCT: 26 % — ABNORMAL LOW (ref 36.0–46.0)
Hemoglobin: 8 g/dL — ABNORMAL LOW (ref 12.0–15.0)

## 2020-04-05 LAB — BASIC METABOLIC PANEL
Anion gap: 12 (ref 5–15)
BUN: 46 mg/dL — ABNORMAL HIGH (ref 8–23)
CO2: 23 mmol/L (ref 22–32)
Calcium: 8.8 mg/dL — ABNORMAL LOW (ref 8.9–10.3)
Chloride: 107 mmol/L (ref 98–111)
Creatinine, Ser: 1.43 mg/dL — ABNORMAL HIGH (ref 0.44–1.00)
GFR calc Af Amer: 41 mL/min — ABNORMAL LOW (ref >60)
GFR calc non Af Amer: 35 mL/min — ABNORMAL LOW (ref >60)
Glucose, Bld: 119 mg/dL — ABNORMAL HIGH (ref 70–99)
Potassium: 3.7 mmol/L (ref 3.5–5.1)
Sodium: 142 mmol/L (ref 135–145)

## 2020-04-05 LAB — GLUCOSE, CAPILLARY
Glucose-Capillary: 121 mg/dL — ABNORMAL HIGH (ref 70–99)
Glucose-Capillary: 120 mg/dL — ABNORMAL HIGH (ref 70–99)
Glucose-Capillary: 90 mg/dL (ref 70–99)
Glucose-Capillary: 132 mg/dL — ABNORMAL HIGH (ref 70–99)

## 2020-04-05 LAB — CBC
HCT: 22.3 % — ABNORMAL LOW (ref 36.0–46.0)
Hemoglobin: 6.5 g/dL — CL (ref 12.0–15.0)
MCH: 20.6 pg — ABNORMAL LOW (ref 26.0–34.0)
MCHC: 29.1 g/dL — ABNORMAL LOW (ref 30.0–36.0)
MCV: 70.8 fL — ABNORMAL LOW (ref 80.0–100.0)
Platelets: 493 10*3/uL — ABNORMAL HIGH (ref 150–400)
RBC: 3.15 MIL/uL — ABNORMAL LOW (ref 3.87–5.11)
RDW: 21.4 % — ABNORMAL HIGH (ref 11.5–15.5)
WBC: 16.6 10*3/uL — ABNORMAL HIGH (ref 4.0–10.5)
nRBC: 0.2 % (ref 0.0–0.2)

## 2020-04-05 LAB — IRON: Iron: 17 ug/dL — ABNORMAL LOW (ref 28–170)

## 2020-04-05 LAB — PREPARE RBC (CROSSMATCH)

## 2020-04-05 MED ORDER — NILOTINIB HCL 150 MG PO CAPS
300.0000 mg | ORAL_CAPSULE | Freq: Two times a day (BID) | ORAL | Status: DC
  Administered 2020-04-05 – 2020-04-08 (×6): 300 mg via ORAL
  Filled 2020-04-05 (×5): qty 2

## 2020-04-05 MED ORDER — SALINE SPRAY 0.65 % NA SOLN
1.0000 | NASAL | Status: DC | PRN
  Filled 2020-04-05: qty 44

## 2020-04-05 MED ORDER — BOOST / RESOURCE BREEZE PO LIQD CUSTOM
1.0000 | Freq: Three times a day (TID) | ORAL | Status: DC
  Administered 2020-04-05 – 2020-04-08 (×5): 1 via ORAL

## 2020-04-05 MED ORDER — SODIUM CHLORIDE 0.9% IV SOLUTION: Freq: Once | INTRAVENOUS | Status: AC

## 2020-04-05 NOTE — Progress Notes (Signed)
Sara Warner Progress Note  CC: Symptomatic anemia  Subjective: She had large volume nose bleeds yesterday, started after Covid 19 nasal swab test done Wed. 5/5. Her RN confirms patient had numerous large volume nose bleeds yesterday. No further nose bleeds since nares packed yesterday afternoon. She denies having an N/V or abdominal pain. No BM today. No family at the bed side.   Objective:   RUQ sonogram 04/04/2020: Probable borderline hepatomegaly. No other abnormality seen in the right upper quadrant of the abdomen.   Vital signs in last 24 hours: Temp:  [97.4 F (36.3 C)-98.4 F (36.9 C)] 97.8 F (36.6 C) (05/07 1052) Pulse Rate:  [86-100] 90 (05/07 1052) Resp:  [16-20] 18 (05/07 1052) BP: (123-166)/(54-81) 125/54 (05/07 1052) SpO2:  [96 %-100 %] 98 % (05/07 1027) Last BM Date: 04/05/20 General:   Alert fatigued appearing female with dried blood on face and nasal area. In NAD. Heart:  Irregular rhythm, no murmur.  Pulm:  Breath sounds clear throughout.  Abdomen: Soft, nontender, left hepaticlobe slightly palpable. + BS x 4 quads.  Extremities:  Without edema. Neurologic:  Alert and  oriented x4;  grossly normal neurologically. Psych:  Alert and cooperative. Normal mood and affect.  Intake/Output from previous day: No intake/output data recorded. Intake/Output this shift: Total I/O In: 10 [I.V.:10] Out: -   Lab Results: Recent Labs    04/03/20 1200 04/04/20 0140 04/05/20 0629  WBC 13.1* 16.0* 16.6*  HGB 7.0* 8.7* 6.5*  HCT 25.1* 29.4* 22.3*  PLT 594* 567* 493*   BMET Recent Labs    04/03/20 1200 04/04/20 0140 04/05/20 0629  NA 140 140 142  K 3.4* 3.7 3.7  CL 105 106 107  CO2 24 25 23   GLUCOSE 175* 156* 119*  BUN 25* 31* 46*  CREATININE 1.67* 1.44* 1.43*  CALCIUM 9.0 8.9 8.8*   LFT Recent Labs    04/03/20 1800  PROT 6.8  ALBUMIN 3.8  AST 14*  ALT 14  ALKPHOS 49  BILITOT 0.9  BILIDIR <0.1  IBILI NOT CALCULATED    PT/INR No results for input(s): LABPROT, INR in the last 72 hours. Hepatitis Panel No results for input(s): HEPBSAG, HCVAB, HEPAIGM, HEPBIGM in the last 72 hours.  US Abdomen Limited RUQ  Result Date: 04/04/2020 CLINICAL DATA:  Hepatomegaly. EXAM: ULTRASOUND ABDOMEN LIMITED RIGHT UPPER QUADRANT COMPARISON:  Apr 27, 2015. FINDINGS: Gallbladder: No gallstones or wall thickening visualized. No sonographic Murphy sign noted by sonographer. Common bile duct: Diameter: 5 mm which is within normal limits. Liver: No focal lesion identified. Within normal limits in parenchymal echogenicity. The liver has a maximum length of approximately 23 cm which is borderline enlarged. Portal vein is patent on color Doppler imaging with normal direction of blood flow towards the liver. Other: None. IMPRESSION: Probable borderline hepatomegaly. No other abnormality seen in the right upper quadrant of the abdomen. Electronically Signed   By: Marijo Conception M.D.   On: 04/04/2020 15:28    Assessment / Plan:  32.  79 year old female complex past medical history including CML, atrial fibrillation on aspirin and Plavix, peripheral arterial disease s/p right common femoral to below-knee popliteal bypass 12/15/2019 with post operative incisional bleeding resulting in acute anemia requiring 2 units of packed red blood cells 01/10/2010. She was admitted to the hospital 04/03/2020 with acute anemia.  Hemoglobin 7.0. She received 2 units of  PRBCs 5/5. FOBT + on loose dark brown stool. Repeat Iron 17. Hg dropped from 8.7 to  6.5 today after large volume epistaxis 5/6-6/7. Nares sprayed with phenylephrine and packed by internal medicine team. No further epistaxis since then.  One unit of PRBCs (her 3rd unit since admission)currently transfusing.  -Hold Plavix -EGD +/-colonoscopy after Plavix washout -Transfuse for Hg < 8 -Follow H/H closely, await post transfusion H/H -Protonix 40mg  IV bid  -CBC, CMP and PT/INR in am. -Consider IV  iron during hospital admission   2. Epistaxis (after Covid 19 nasopharyngeal swab done 5/5) -Management per the hospitalist   3. History of colon polyps. Family (mother with history of colon cancer)  4. Atrial fibrillation.  Systolic CHF. Nonischemic cardiomyopathy -Hold Plavix   5. CKD. BUN 31. Cr. 1.44.   6. DM II  7. History of CML diagnosed in 3013. WBC 16.6. On Tasigna followed by Dr. Alen Blew  Active Problems:   Symptomatic anemia   Epistaxis   Occult blood in stools   Antiplatelet or antithrombotic long-term use     LOS: 1 day   Noralyn Pick  04/05/2020, 11:10 AM

## 2020-04-05 NOTE — Progress Notes (Signed)
CRITICAL VALUE STICKER  CRITICAL VALUE:    DATE & TIME NOTIFIED: 8:16 AM  MESSENGER Hemoglobin 6.5  MD NOTIFIED: Gilford Rile MD  TIME OF NOTIFICATION: 7227  RESPONSE: see new orders

## 2020-04-05 NOTE — H&P (View-Only) (Signed)
[facility] Progress Note  CC: Symptomatic anemia  Subjective: She had large volume nose bleeds yesterday, started after Covid 19 nasal swab test done Wed. 5/5. Her RN confirms patient had numerous large volume nose bleeds yesterday. No further nose bleeds since nares packed yesterday afternoon. She denies having an N/V or abdominal pain. No BM today. No family at the bed side.   Objective:   RUQ sonogram 04/04/2020: Probable borderline hepatomegaly. No other abnormality seen in the right upper quadrant of the abdomen.   Vital signs in last 24 hours: Temp:  [97.4 F (36.3 C)-98.4 F (36.9 C)] 97.8 F (36.6 C) (05/07 1052) Pulse Rate:  [86-100] 90 (05/07 1052) Resp:  [16-20] 18 (05/07 1052) BP: (123-166)/(54-81) 125/54 (05/07 1052) SpO2:  [96 %-100 %] 98 % (05/07 1027) Last BM Date: 04/05/20 General:   Alert fatigued appearing female with dried blood on face and nasal area. In NAD. Heart:  Irregular rhythm, no murmur.  Pulm:  Breath sounds clear throughout.  Abdomen: Soft, nontender, left hepaticlobe slightly palpable. + BS x 4 quads.  Extremities:  Without edema. Neurologic:  Alert and  oriented x4;  grossly normal neurologically. Psych:  Alert and cooperative. Normal mood and affect.  Intake/Output from previous day: No intake/output data recorded. Intake/Output this shift: Total I/O In: 10 [I.V.:10] Out: -   Lab Results: Recent Labs    04/03/20 1200 04/04/20 0140 04/05/20 0629  WBC 13.1* 16.0* 16.6*  HGB 7.0* 8.7* 6.5*  HCT 25.1* 29.4* 22.3*  PLT 594* 567* 493*   BMET Recent Labs    04/03/20 1200 04/04/20 0140 04/05/20 0629  NA 140 140 142  K 3.4* 3.7 3.7  CL 105 106 107  CO2 24 25 23   GLUCOSE 175* 156* 119*  BUN 25* 31* 46*  CREATININE 1.67* 1.44* 1.43*  CALCIUM 9.0 8.9 8.8*   LFT Recent Labs    04/03/20 1800  PROT 6.8  ALBUMIN 3.8  AST 14*  ALT 14  ALKPHOS 49  BILITOT 0.9  BILIDIR <0.1  IBILI NOT CALCULATED    PT/INR No results for input(s): LABPROT, INR in the last 72 hours. Hepatitis Panel No results for input(s): HEPBSAG, HCVAB, HEPAIGM, HEPBIGM in the last 72 hours.  US Abdomen Limited RUQ  Result Date: 04/04/2020 CLINICAL DATA:  Hepatomegaly. EXAM: ULTRASOUND ABDOMEN LIMITED RIGHT UPPER QUADRANT COMPARISON:  Apr 27, 2015. FINDINGS: Gallbladder: No gallstones or wall thickening visualized. No sonographic Murphy sign noted by sonographer. Common bile duct: Diameter: 5 mm which is within normal limits. Liver: No focal lesion identified. Within normal limits in parenchymal echogenicity. The liver has a maximum length of approximately 23 cm which is borderline enlarged. Portal vein is patent on color Doppler imaging with normal direction of blood flow towards the liver. Other: None. IMPRESSION: Probable borderline hepatomegaly. No other abnormality seen in the right upper quadrant of the abdomen. Electronically Signed   By: Marijo Conception M.D.   On: 04/04/2020 15:28    Assessment / Plan:  19.  79 year old female complex past medical history including CML, atrial fibrillation on aspirin and Plavix, peripheral arterial disease s/p right common femoral to below-knee popliteal bypass 12/15/2019 with post operative incisional bleeding resulting in acute anemia requiring 2 units of packed red blood cells 01/10/2010. She was admitted to the hospital 04/03/2020 with acute anemia.  Hemoglobin 7.0. She received 2 units of  PRBCs 5/5. FOBT + on loose dark brown stool. Repeat Iron 17. Hg dropped from 8.7 to  6.5 today after large volume epistaxis 5/6-6/7. Nares sprayed with phenylephrine and packed by internal medicine team. No further epistaxis since then.  One unit of PRBCs (her 3rd unit since admission)currently transfusing.  -Hold Plavix -EGD +/-colonoscopy after Plavix washout -Transfuse for Hg < 8 -Follow H/H closely, await post transfusion H/H -Protonix 40mg  IV bid  -CBC, CMP and PT/INR in am. -Consider IV  iron during hospital admission   2. Epistaxis (after Covid 19 nasopharyngeal swab done 5/5) -Management per the hospitalist   3. History of colon polyps. Family (mother with history of colon cancer)  4. Atrial fibrillation.  Systolic CHF. Nonischemic cardiomyopathy -Hold Plavix   5. CKD. BUN 31. Cr. 1.44.   6. DM II  7. History of CML diagnosed in 3013. WBC 16.6. On Tasigna followed by Dr. Alen Blew  Active Problems:   Symptomatic anemia   Epistaxis   Occult blood in stools   Antiplatelet or antithrombotic long-term use     LOS: 1 day   Noralyn Pick  04/05/2020, 11:10 AM

## 2020-04-05 NOTE — Progress Notes (Signed)
Initial Nutrition Assessment  DOCUMENTATION CODES:   Not applicable  INTERVENTION:   -Boost Breeze po TID, each supplement provides 250 kcal and 9 grams of protein -MVI with minerals daily  NUTRITION DIAGNOSIS:   Inadequate oral intake related to altered GI function as evidenced by meal completion < 50%.  GOAL:   Patient will meet greater than or equal to 90% of their needs  MONITOR:   PO intake, Supplement acceptance, Diet advancement, Labs, Weight trends, Skin, I & O's  REASON FOR ASSESSMENT:   Malnutrition Screening Tool    ASSESSMENT:   Sara Warner is a 79 year old female with a past medical history significant for left femoral bypass surgery in February 2021 on aspirin and Plavix complicated by symptomatic blood loss anemia requiring transfusions, chronic A. fib, HFrEF (EF45-50% 01/2018), HTN, CKD stage III, and CML who presents today with symptomatic anemia after being referred to the ED by her PCP for Hgb of 7.0.  Pt admitted with symptomatic anemia.   Reviewed I/O's: +555 ml x 24 hours  Pt resting quietly, receiving nursing care at time of visit. Observed clear liquids tray, pt appeared to have consumed less than half.   Reviewed wt hx; pt has experienced a 6.4% wt loss over the past 3 months, which is not significant for time frame.  Medications reviewed and include lasix, miralax, and senna.   Lab Results  Component Value Date   HGBA1C 5.4 01/10/2020   PTA DM medications are 50 mg januvia daily and 10 mg glipizide daily.   Labs reviewed: CBGS: 121-141 (inpatient orders for glycemic control are 0-5 units insulin aspart q HS and0-9 units insulin aspart TID with meals ).   NUTRITION - FOCUSED PHYSICAL EXAM:    Most Recent Value  Orbital Region  No depletion  Upper Arm Region  No depletion  Thoracic and Lumbar Region  Unable to assess  Buccal Region  No depletion  Temple Region  No depletion  Clavicle Bone Region  No depletion  Clavicle and Acromion  Bone Region  No depletion  Scapular Bone Region  Unable to assess  Dorsal Hand  No depletion  Patellar Region  Unable to assess  Anterior Thigh Region  Unable to assess  Posterior Calf Region  Unable to assess  Edema (RD Assessment)  None  Hair  Reviewed  Eyes  Reviewed  Mouth  Reviewed  Skin  Reviewed  Nails  Reviewed       Diet Order:   Diet Order            Diet clear liquid Room service appropriate? Yes; Fluid consistency: Thin  Diet effective now              EDUCATION NEEDS:   No education needs have been identified at this time  Skin:  Skin Assessment: Reviewed RN Assessment  Last BM:  04/05/20  Height:   Ht Readings from Last 1 Encounters:  04/03/20 5\' 8"  (1.727 m)    Weight:   Wt Readings from Last 1 Encounters:  04/03/20 78.5 kg    Ideal Body Weight:  63.6 kg  BMI:  Body mass index is 26.3 kg/m.  Estimated Nutritional Needs:   Kcal:  1700-1749  Protein:  90-105 grams  Fluid:  > 1.7 L    Sara Warner, RD, LDN, [place] Registered Dietitian II Certified Diabetes Care and Education Specialist Please refer to [facility] for RD and/or RD on-call/weekend/after hours pager

## 2020-04-05 NOTE — Progress Notes (Signed)
   Subjective: Pt is a 79-yr-old woman with PMH of Afib, HTN, HFrEF, and femoral bypass surgery who presented to the hospital with fatigue and SOB with exertion. Rhino Rocket is present in left nare, and there is no active nosebleed. Pt reports a several month history of melena with Miralax use since her femoral bypass surgery and denies Pepto-Bismol and iron use during this period. Denies fevers, chills, fatigue, chest pain, SOB, and blood in stool.  Objective:  Vital signs in last 24 hours: Vitals:   04/04/20 1041 04/04/20 1803 04/04/20 2337 04/05/20 0531  BP: (!) 148/67 (!) 166/81 136/61 126/62  Pulse: 76 93 100 86  Resp: 20 18 20 18   Temp: 97.9 F (36.6 C) (!) 97.4 F (36.3 C) 98.1 F (36.7 C) 98.4 F (36.9 C)  TempSrc: Oral Oral Oral Oral  SpO2: 97% 100% 96% 97%  Weight:      Height:       General: Pt is well-appearing and in NAD HEENT: No epistaxis. Dried blood present in both nares. Rhino Rocket present in left nare Cardiac: Irregular rhythm. No murmurs, rubs, or gallops Respiratory:Lungs are CTA bilaterally. No wheezes, rales, or rhonchi Abdominal:Soft and non-tender with normoactive bowel sounds Neuro: Alert and oriented x3. No gross neurologic deficits noted  Extremities: spontaneously moving all extremities Skin: Warm and dry. No erythema or rashes. Several seborrheic keratoses noted on back  Assessment/Plan:  Active Problems:   Symptomatic anemia   Epistaxis   Occult blood in stools   Antiplatelet or antithrombotic long-term use  Pt is a 80 year old female with a PMH significant for HTN, HLD, CML, Afib, CKD stage 3, HFrEF, and femoral bypass surgery in Feb 2021 who presented to the hospital with a Hgb of 7.0. Received blood transfusion 04/03/2020. Epistaxis currently managed with Rhino Rocket. Hgb this morning was 6.5.  Symptomatic anemia:Hgb this morning was 6.5. Blood transfusion is indicated. She denies fevers, chills, fatigue, chest pain and SOB.Pt has near  melenic stool after she takes Miralax, has been ongoing since femoral bypass surgery. Denies blood in stool. Iron is 17 -1u pRBC ordered -F/u H&H -Hold aspirin and Plavix -EGD+/-colonoscopy after Plavix washout -Monitor CBC  Epistaxis: Pt removed nasal packing last night. Rhino Rocket placed in left nare overnight, no active bleeding today -Leave Rhino Rocket in place  HFrEF (EF 45-50% 01/2018) HTN: -Metoprolol XL 200 mg QD -Digoxin 0.0625 mg every other day -Spironolactone 12.5 mg QD -Lasix 20 mg BID  HLD: -Atorvastatin 20 mg QD  CKD stage III: Pt's creatinine is stable at her baseline of 1.67 on admission. Cr 1.43 today, BUN 46, and GFR 35 -Daily BMP  T2DM:Last glucose 119. Pt takes Januvia 50 mg daily and glipizide 10 mg daily at home. -Hold home Januvia and glipizide -SSI   Hypothyroidism: -IV levothyroxine 75 mcg QD  CML: WBCs trending upward, 13.1 on admission and 16.6 today. Concerning for recurrence -Consult oncology on restarting Nilotinib 300 mg QD inpatient  FEN/GI: -Diet:Clear liquids -Fluids:IV 0.9% NaCl 10 mL/hr -Miralax 17 mg QD, senna tablet 8.6 mg BID  DVT prophylaxis: -SCDs  Prior to Admission Living Arrangement: Home Anticipated Discharge Location: Home Barriers to Discharge: None Dispo: Anticipated discharge in greater than [place], Medical Student 04/05/2020, 10:23 AM Pager: [phone]

## 2020-04-05 NOTE — Progress Notes (Addendum)
Paged to the bedside by RN for continued epistaxis. On arrival, pt with lightly packed gauze in left nostril and some blood seen around the nare opening. Pt had packing placed by daytime MD around 1500. She states she removed the packing earlier this evening due to discomfort. Currently denies dizziness/lightheadedness, palpitations, chest pain, shortness of breath or nausea. Upon removal of the gauze, pt having slow intermittent oozing from the left nostril and dried blood within the mouth. No evidence of active bleeding from the right nostril, only some dried blood around the skin below the nose.  Gave phenylephrine spray into left nare. Soaked gauze with phenylephrine and packed left nare with a Q-tip. Taped gauze ends down over left cheek. Pt tolerated procedure well and denied pain with the packing. Instructed her to leave the packing in overnight. Asked nursing staff to request Rhino Rocket to the bedside (as it needs to come up from ED) to have available in case packing is insufficient.  ADDENDUM Paged back to the bedside 45 minutes later for pt slowly bleeding through gauze packing and Rhino Rocket at the bedside. Pt reassessed, blood noted on packing in left nare. Small clot on the back of packing noted when the gauze was removed. No active bleeding at that time. Rhino Rocket placed. Pt observed for 10 minutes. No further bleeding noted. Visible portion of Rhino Rocket expanding and without blood. RN will continue to monitor.

## 2020-04-06 LAB — GLUCOSE, CAPILLARY
Glucose-Capillary: 121 mg/dL — ABNORMAL HIGH (ref 70–99)
Glucose-Capillary: 131 mg/dL — ABNORMAL HIGH (ref 70–99)
Glucose-Capillary: 112 mg/dL — ABNORMAL HIGH (ref 70–99)
Glucose-Capillary: 117 mg/dL — ABNORMAL HIGH (ref 70–99)

## 2020-04-06 LAB — CBC
HCT: 25.7 % — ABNORMAL LOW (ref 36.0–46.0)
Hemoglobin: 7.6 g/dL — ABNORMAL LOW (ref 12.0–15.0)
MCH: 22.2 pg — ABNORMAL LOW (ref 26.0–34.0)
MCHC: 29.6 g/dL — ABNORMAL LOW (ref 30.0–36.0)
MCV: 75.1 fL — ABNORMAL LOW (ref 80.0–100.0)
Platelets: 473 10*3/uL — ABNORMAL HIGH (ref 150–400)
RBC: 3.42 MIL/uL — ABNORMAL LOW (ref 3.87–5.11)
RDW: 23.9 % — ABNORMAL HIGH (ref 11.5–15.5)
WBC: 15.3 10*3/uL — ABNORMAL HIGH (ref 4.0–10.5)
nRBC: 0.3 % — ABNORMAL HIGH (ref 0.0–0.2)

## 2020-04-06 LAB — BASIC METABOLIC PANEL
Anion gap: 9 (ref 5–15)
BUN: 33 mg/dL — ABNORMAL HIGH (ref 8–23)
CO2: 25 mmol/L (ref 22–32)
Calcium: 8.5 mg/dL — ABNORMAL LOW (ref 8.9–10.3)
Chloride: 104 mmol/L (ref 98–111)
Creatinine, Ser: 1.49 mg/dL — ABNORMAL HIGH (ref 0.44–1.00)
GFR calc Af Amer: 39 mL/min — ABNORMAL LOW (ref >60)
GFR calc non Af Amer: 33 mL/min — ABNORMAL LOW (ref >60)
Glucose, Bld: 151 mg/dL — ABNORMAL HIGH (ref 70–99)
Potassium: 3.2 mmol/L — ABNORMAL LOW (ref 3.5–5.1)
Sodium: 138 mmol/L (ref 135–145)

## 2020-04-06 LAB — HEMOGLOBIN AND HEMATOCRIT, BLOOD
HCT: 29.9 % — ABNORMAL LOW (ref 36.0–46.0)
Hemoglobin: 9.1 g/dL — ABNORMAL LOW (ref 12.0–15.0)

## 2020-04-06 LAB — PREPARE RBC (CROSSMATCH)

## 2020-04-06 MED ORDER — SODIUM CHLORIDE 0.9% IV SOLUTION: Freq: Once | INTRAVENOUS | Status: AC

## 2020-04-06 MED ORDER — POTASSIUM CHLORIDE CRYS ER 20 MEQ PO TBCR
20.0000 meq | EXTENDED_RELEASE_TABLET | Freq: Two times a day (BID) | ORAL | Status: AC
  Administered 2020-04-06 (×2): 20 meq via ORAL
  Filled 2020-04-06 (×2): qty 1

## 2020-04-06 NOTE — Progress Notes (Signed)
   Subjective:  Patient evaluated at bedside this morning. She states that she is feeling well this morning. She states that she has not had anymore bleeding from her nose or from her bottom. She would like the nasal packing out today. Her family member is bedside, states that Sara Warner did have some dark stools, but was unsure if it was due to the blood from her nose. She denies nausea, vomiting, chest pain, light headedness, or palpitations.    Objective:  Vital signs in last 24 hours: Vitals:   04/05/20 1338 04/05/20 1758 04/05/20 2314 04/06/20 0455  BP: (!) 129/48 (!) 110/50 (!) 115/54 127/69  Pulse: 89 70 67 77  Resp: 18 18 20 18   Temp: 97.6 F (36.4 C) 97.6 F (36.4 C) 97.8 F (36.6 C) 97.7 F (36.5 C)  TempSrc: Oral Oral Oral Oral  SpO2: 97% 100% 100% 96%  Weight:      Height:       General: awake, alert, sitting up in bed in NAD HEENT: Nasal packing in left nare; no signs of further bleeding  CV: RRR; no m/r/g Pulm: normal work of breathing; lungs CTA Abd: soft, non-tender   Assessment/Plan:  Active Problems:   Symptomatic anemia   Epistaxis   Occult blood in stools   Antiplatelet or antithrombotic long-term use  In summary, Sara Warner is a 79 year old female with a past medical history significant for left femoral bypass surgery in February 2021 on aspirin and Plavix complicated by symptomatic blood loss anemia requiring transfusions, chronic A. fib, HFrEF (EF45-50% 01/2018), HTN, CKD stage III, and CML who presents today with symptomatic anemia after being referred to the ED by her PCP for Hgb of 7.0.  Symptomatic anemia:Likely etiology UGIB in the setting of DAPT - given PVD with recent fem-pop bypass, transfusion goal is hgb > 8 - holding aspirin and plavix - s/p 3 units pRBCs this admission; getting her 4th unit this morning - symptomatically improved  - appreciate GI consult; plan to do endoscopic eval after Plavix washout which is tentatively  scheduled for 04/08/20 - trend CBCs daily   Epistaxis: Resolved. Will have nursing remove nasal packing this morning.   HFrEF (EF 45-50% 01/2018) HTN: -Metoprolol XL 200 mgQD -Digoxin 0.0625 mg every other day -Spironolactone 12.5 mgQD -Lasix 20 mg BID  HLD: -Atorvastatin 20 mgQD  CKD stage III: crt stable.   T2DM:Pttakes Januvia 50 mg daily and glipizide 10 mg dailyat home. -SSI while inpatient   Hypothyroidism: -IV levothyroxine 62mcg QD  CML: leukocytosis stable. Spoke with outpatient oncologist who agreed with resumption of her Nilotinib    DVT prophylaxis: -SCDs  Prior to Admission Living Arrangement: home Anticipated Discharge Location: home Barriers to Discharge: continued medical work-up  Dispo: Anticipated discharge in approximately 2-3 day(s) after endoscopic evaluation.   Modena Nunnery D, DO 04/06/2020, 5:59 AM Pager: [phone]

## 2020-04-07 LAB — TYPE AND SCREEN
ABO/RH(D): A POS
Antibody Screen: NEGATIVE
Unit division: 0
Unit division: 0
Unit division: 0
Unit division: 0

## 2020-04-07 LAB — BPAM RBC
Blood Product Expiration Date: 202105112359
Blood Product Expiration Date: 202105122359
Blood Product Expiration Date: 202105132359
Blood Product Expiration Date: 202105312359
ISSUE DATE / TIME: 202105051439
ISSUE DATE / TIME: 202105051819
ISSUE DATE / TIME: 202105071018
ISSUE DATE / TIME: 202105081112
Unit Type and Rh: 600
Unit Type and Rh: 600
Unit Type and Rh: 6200
Unit Type and Rh: 6200

## 2020-04-07 LAB — BASIC METABOLIC PANEL
Anion gap: 11 (ref 5–15)
BUN: 25 mg/dL — ABNORMAL HIGH (ref 8–23)
CO2: 25 mmol/L (ref 22–32)
Calcium: 8.8 mg/dL — ABNORMAL LOW (ref 8.9–10.3)
Chloride: 105 mmol/L (ref 98–111)
Creatinine, Ser: 1.5 mg/dL — ABNORMAL HIGH (ref 0.44–1.00)
GFR calc Af Amer: 38 mL/min — ABNORMAL LOW (ref >60)
GFR calc non Af Amer: 33 mL/min — ABNORMAL LOW (ref >60)
Glucose, Bld: 141 mg/dL — ABNORMAL HIGH (ref 70–99)
Potassium: 3.5 mmol/L (ref 3.5–5.1)
Sodium: 141 mmol/L (ref 135–145)

## 2020-04-07 LAB — GLUCOSE, CAPILLARY
Glucose-Capillary: 134 mg/dL — ABNORMAL HIGH (ref 70–99)
Glucose-Capillary: 132 mg/dL — ABNORMAL HIGH (ref 70–99)
Glucose-Capillary: 100 mg/dL — ABNORMAL HIGH (ref 70–99)
Glucose-Capillary: 107 mg/dL — ABNORMAL HIGH (ref 70–99)

## 2020-04-07 LAB — CBC
HCT: 30.5 % — ABNORMAL LOW (ref 36.0–46.0)
Hemoglobin: 9.3 g/dL — ABNORMAL LOW (ref 12.0–15.0)
MCH: 23.9 pg — ABNORMAL LOW (ref 26.0–34.0)
MCHC: 30.5 g/dL (ref 30.0–36.0)
MCV: 78.4 fL — ABNORMAL LOW (ref 80.0–100.0)
Platelets: 503 10*3/uL — ABNORMAL HIGH (ref 150–400)
RBC: 3.89 MIL/uL (ref 3.87–5.11)
RDW: 25.9 % — ABNORMAL HIGH (ref 11.5–15.5)
WBC: 13.8 10*3/uL — ABNORMAL HIGH (ref 4.0–10.5)
nRBC: 0 % (ref 0.0–0.2)

## 2020-04-07 LAB — MAGNESIUM: Magnesium: 2 mg/dL (ref 1.7–2.4)

## 2020-04-07 MED ORDER — PEG-KCL-NACL-NASULF-NA ASC-C 100 G PO SOLR
0.5000 | Freq: Once | ORAL | Status: AC
  Administered 2020-04-07: 100 g via ORAL
  Filled 2020-04-07: qty 1

## 2020-04-07 MED ORDER — PEG-KCL-NACL-NASULF-NA ASC-C 100 G PO SOLR: 1.0000 | Freq: Once | ORAL | Status: DC

## 2020-04-07 MED ORDER — SODIUM CHLORIDE 0.9 % IV SOLN: 510.0000 mg | Freq: Once | INTRAVENOUS | Status: DC

## 2020-04-07 MED ORDER — PEG-KCL-NACL-NASULF-NA ASC-C 100 G PO SOLR
0.5000 | Freq: Once | ORAL | Status: AC
  Administered 2020-04-07: 100 g via ORAL

## 2020-04-07 NOTE — Anesthesia Preprocedure Evaluation (Addendum)
Anesthesia Evaluation  Patient identified by MRN, date of birth, ID band Patient awake    Reviewed: Allergy & Precautions, NPO status , Patient's Chart, lab work & pertinent test results  History of Anesthesia Complications Negative for: history of anesthetic complications  Airway Mallampati: II  TM Distance: >3 FB Neck ROM: Full    Dental no notable dental hx.    Pulmonary former smoker,    Pulmonary exam normal        Cardiovascular hypertension, Pt. on medications +CHF  Normal cardiovascular exam+ dysrhythmias Atrial Fibrillation   TTE 2019: EF 45-50%, mild MR, moderate RAE   Neuro/Psych    GI/Hepatic   Endo/Other  diabetes, Type 2, Oral Hypoglycemic AgentsHypothyroidism   Renal/GU Renal InsufficiencyRenal disease (Cr 1.5)     Musculoskeletal   Abdominal   Peds  Hematology  (+) anemia , CML, Hgb 9.3   Anesthesia Other Findings   Reproductive/Obstetrics                            Anesthesia Physical Anesthesia Plan  ASA: III  Anesthesia Plan: MAC   Post-op Pain Management:    Induction:   PONV Risk Score and Plan: 2 and Treatment may vary due to age or medical condition and Propofol infusion  Airway Management Planned: Natural Airway and Nasal Cannula  Additional Equipment: None  Intra-op Plan:   Post-operative Plan:   Informed Consent: I have reviewed the patients History and Physical, chart, labs and discussed the procedure including the risks, benefits and alternatives for the proposed anesthesia with the patient or authorized representative who has indicated his/her understanding and acceptance.       Plan Discussed with: CRNA  Anesthesia Plan Comments:        Anesthesia Quick Evaluation

## 2020-04-07 NOTE — Progress Notes (Addendum)
   Subjective: Pt is a 79-yr-old woman with PMH of HTN, Afib, HFrEF, HLD, and femoral bypass surgery who presented to the hospital with fatigue and SOB on exertion. She is feeling well overall. Pt denies fatigue, chest pain, SOB, and nosebleeds. She had no BM last night.  Objective:  Vital signs in last 24 hours: Vitals:   04/06/20 1432 04/06/20 1750 04/06/20 2333 04/07/20 0558  BP: (!) 130/51 122/67 112/69 125/63  Pulse: (!) 54 75 86 77  Resp: 18 16 18 18   Temp: 97.8 F (36.6 C) 97.8 F (36.6 C) 97.7 F (36.5 C) 97.8 F (36.6 C)  TempSrc: Axillary Oral Oral Oral  SpO2:  100% 97% 98%  Weight:      Height:       General: Pt is well-appearing and in NAD HEENT: [place]/AT. No epistaxis Cardiac: Irregular rhythm. No murmurs, rubs, or gallops Respiratory: Lungs are CTA bilaterally. No wheezes, rales, or rhonchi Abdominal: Soft and non-tender with normoactive bowel sounds Neuro: Alert and oriented x3. No gross neurologic deficits noted  Extremities: spontaneously moving all extremities  Skin: Warm and dry. No erythema or rashes. Several seborrheic keratoses noted on back and lower chest  Assessment/Plan:  Active Problems:   Symptomatic anemia   Epistaxis   Occult blood in stools   Antiplatelet or antithrombotic long-term use  Symptomatic anemia: Hgb this morning was 9.3. She denies fatigue, chest pain,  SOB, and nosebleeds. Pt denies having BM last night -Hold aspirin and Plavix -NPO at midnight for EGD and colonoscopy -EGD and colonoscopy on 5/10 after Plavix washout -Monitor CBC daily   Epistaxis: Pt denies nosebleeds. No epistaxis noted on exam -Continue to monitor for epistaxis -0.65% NaCl nasal spray PRN   HFrEF (EF 45-50% 01/2018) HTN: -Metoprolol XL 200 mg QD -Digoxin 0.0625 mg every other day -Spironolactone 12.5 mg QD -Lasix 20 mg BID   HLD: -Atorvastatin 20 mg QD   CKD stage III: Cr stable at 1.50 (baseline 1.67), BUN 25, and GFR 33 -Daily BMP   T2DM: Last  glucose 141. Pt takes Januvia 50 mg daily and glipizide 10 mg daily at home. 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 -Continue nilotinib 300 mg QD   FEN/GI: -Diet: Clear liquids -Fluids: IV 0.9% NaCl 10 mL/hr -Miralax 17 mg QD, senna tablet 8.6 mg BID   DVT prophylaxis: -SCDs  Prior to Admission Living Arrangement: Home Anticipated Discharge Location: Home Barriers to Discharge: None Dispo: Anticipated discharge in greater than [place], Medical Student 04/07/2020, 8:34 AM Pager: [phone]  Attestation for Student Documentation:  I personally was present and performed or re-performed the history, physical exam and medical decision-making activities of this service and have verified that the service and findings are accurately documented in the student's note.  Earlene Plater, MD Internal Medicine, PGY1 Pager: [phone]  04/07/2020,10:35 AM

## 2020-04-07 NOTE — Progress Notes (Addendum)
     [facility] Progress Note  CC:  I'm feeling better, recent bleeding  Assessment and Plan: Symptomatic GI blood loss anemia with FOBT+ stools, possible melena Epistaxis following Covid swab on admission Atrial fibrillation, chronic treatment with ASA and Plavix on hold Personal history of colon polyps on colonoscopy with Dr. Melina Copa approximately 10 years ago Family history of colon cancer (mother in her 29s)  Hemoglobin stable, although magnitude of decline complicated by significant epistaxis occurring after Covid swab on admission. No additional overt bleeding.   Proceed with colonoscopy and EGD 04/08/20 as she will have completed a 5 day washout of Plavis. Clear liquid diet today. Bowel prep tonight. NPO at midnight in preparation for the procedures. Continue serial hgb/hct with transfusion as necessary in the meantime.   Given advanced age, comorbidities, and anticoagulants, the procedure is high risk. The nature of the procedure, as well as the risks, benefits, and alternatives were carefully and thoroughly reviewed with the patient. Ample time for discussion and questions allowed. The patient understood, was satisfied, and agreed to proceed.  Subjective: Epistaxis has resolved. No further overt bleeding noted during this hospitalization. No BM overnight. GI ROS is negative.  No family present at the time of my evaluation today.   Objective:  Vital signs in last 24 hours: Temp:  [97.7 F (36.5 C)-98.5 F (36.9 C)] 98.5 F (36.9 C) (05/09 1239) Pulse Rate:  [54-86] 72 (05/09 1239) Resp:  [16-18] 16 (05/09 1239) BP: (108-130)/(47-69) 108/47 (05/09 1239) SpO2:  [97 %-100 %] 99 % (05/09 1239) Last BM Date: 04/05/20 General:   Alert, in NAD. Sitting up in bed at the time of my evaluation.  Abdomen:  Soft. Nontender. Nondistended. Normal bowel sounds. No rebound or guarding. LAD: No inguinal or umbilical LAD Neurologic:  Alert and  oriented x4;  grossly normal  neurologically. Psych:  Alert and cooperative. Normal mood and affect.  Lab Results: Recent Labs    04/05/20 0629 04/05/20 1447 04/06/20 0901 04/06/20 1711 04/07/20 0600  WBC 16.6*  --  15.3*  --  13.8*  HGB 6.5*   < > 7.6* 9.1* 9.3*  HCT 22.3*   < > 25.7* 29.9* 30.5*  PLT 493*  --  473*  --  503*   < > = values in this interval not displayed.   BMET Recent Labs    04/05/20 0629 04/06/20 0901 04/07/20 0600  NA 142 138 141  K 3.7 3.2* 3.5  CL 107 104 105  CO2 23 25 25   GLUCOSE 119* 151* 141*  BUN 46* 33* 25*  CREATININE 1.43* 1.49* 1.50*  CALCIUM 8.8* 8.5* 8.8*      LOS: 3 days   [place]  04/07/2020, 1:29 PM

## 2020-04-08 ENCOUNTER — Encounter (HOSPITAL_COMMUNITY)
Admission: EM | Disposition: A | Discharge status: Home / Self Care | Payer: Self-pay | Source: Home / Self Care | Attending: Internal Medicine

## 2020-04-08 ENCOUNTER — Inpatient Hospital Stay (HOSPITAL_COMMUNITY): Payer: PPO | Admitting: Anesthesiology

## 2020-04-08 DIAGNOSIS — K635 Polyp of colon: Secondary | ICD-10-CM

## 2020-04-08 DIAGNOSIS — K3189 Other diseases of stomach and duodenum: Secondary | ICD-10-CM

## 2020-04-08 DIAGNOSIS — K2970 Gastritis, unspecified, without bleeding: Secondary | ICD-10-CM

## 2020-04-08 HISTORY — PX: ESOPHAGOGASTRODUODENOSCOPY (EGD) WITH PROPOFOL: SHX5813

## 2020-04-08 HISTORY — PX: COLONOSCOPY WITH PROPOFOL: SHX5780

## 2020-04-08 HISTORY — PX: BIOPSY: SHX5522

## 2020-04-08 HISTORY — PX: POLYPECTOMY: SHX5525

## 2020-04-08 LAB — BASIC METABOLIC PANEL
Anion gap: 11 (ref 5–15)
BUN: 18 mg/dL (ref 8–23)
CO2: 23 mmol/L (ref 22–32)
Calcium: 9 mg/dL (ref 8.9–10.3)
Chloride: 106 mmol/L (ref 98–111)
Creatinine, Ser: 1.53 mg/dL — ABNORMAL HIGH (ref 0.44–1.00)
GFR calc Af Amer: 37 mL/min — ABNORMAL LOW (ref >60)
GFR calc non Af Amer: 32 mL/min — ABNORMAL LOW (ref >60)
Glucose, Bld: 147 mg/dL — ABNORMAL HIGH (ref 70–99)
Potassium: 3.5 mmol/L (ref 3.5–5.1)
Sodium: 140 mmol/L (ref 135–145)

## 2020-04-08 LAB — CBC
HCT: 33.6 % — ABNORMAL LOW (ref 36.0–46.0)
Hemoglobin: 10 g/dL — ABNORMAL LOW (ref 12.0–15.0)
MCH: 23.9 pg — ABNORMAL LOW (ref 26.0–34.0)
MCHC: 29.8 g/dL — ABNORMAL LOW (ref 30.0–36.0)
MCV: 80.2 fL (ref 80.0–100.0)
Platelets: 503 10*3/uL — ABNORMAL HIGH (ref 150–400)
RBC: 4.19 MIL/uL (ref 3.87–5.11)
RDW: 27.2 % — ABNORMAL HIGH (ref 11.5–15.5)
WBC: 14.1 10*3/uL — ABNORMAL HIGH (ref 4.0–10.5)
nRBC: 0 % (ref 0.0–0.2)

## 2020-04-08 LAB — GLUCOSE, CAPILLARY
Glucose-Capillary: 115 mg/dL — ABNORMAL HIGH (ref 70–99)
Glucose-Capillary: 202 mg/dL — ABNORMAL HIGH (ref 70–99)

## 2020-04-08 LAB — MRSA PCR SCREENING: MRSA by PCR: POSITIVE — AB

## 2020-04-08 SURGERY — ESOPHAGOGASTRODUODENOSCOPY (EGD) WITH PROPOFOL
Anesthesia: Monitor Anesthesia Care

## 2020-04-08 MED ORDER — SODIUM CHLORIDE 0.9 % IV SOLN: INTRAVENOUS | Status: DC

## 2020-04-08 MED ORDER — PANTOPRAZOLE SODIUM 40 MG PO TBEC
40.0000 mg | DELAYED_RELEASE_TABLET | Freq: Every day | ORAL | 1 refills | Status: DC
Start: 2020-04-08 — End: 2021-02-25

## 2020-04-08 MED ORDER — LACTATED RINGERS IV SOLN
INTRAVENOUS | Status: DC
  Administered 2020-04-08: 1000 mL via INTRAVENOUS

## 2020-04-08 MED ORDER — LIDOCAINE 2% (20 MG/ML) 5 ML SYRINGE
INTRAMUSCULAR | Status: DC | PRN
  Administered 2020-04-08: 40 mg via INTRAVENOUS

## 2020-04-08 MED ORDER — PROPOFOL 500 MG/50ML IV EMUL
INTRAVENOUS | Status: DC | PRN
  Administered 2020-04-08: 100 ug/kg/min via INTRAVENOUS

## 2020-04-08 MED ORDER — PROMETHAZINE HCL 25 MG/ML IJ SOLN: 6.2500 mg | INTRAMUSCULAR | Status: DC | PRN

## 2020-04-08 SURGICAL SUPPLY — 25 items

## 2020-04-08 NOTE — Op Note (Addendum)
[facility] Patient Name: Sara Warner Procedure Date : 04/08/2020 MRN: [id number] Attending MD: Jackquline Denmark , MD Date of Birth: Feb 02, 1941 CSN: [id number] Age: 79 Admit Type: Inpatient Procedure:                Colonoscopy Indications:              Heme positive stool Providers:                Jackquline Denmark, MD, Grace Isaac, RN, Doristine Johns, RN, Laverda Sorenson, Technician Referring MD:              Medicines:                Monitored Anesthesia Care Complications:            No immediate complications. Estimated Blood Loss:     Estimated blood loss: none. Procedure:                Pre-Anesthesia Assessment:                           - Prior to the procedure, a History and Physical                            was performed, and patient medications and                            allergies were reviewed. The patient's tolerance of                            previous anesthesia was also reviewed. The risks                            and benefits of the procedure and the sedation                            options and risks were discussed with the patient.                            All questions were answered, and informed consent                            was obtained. Prior Anticoagulants: The patient has                            taken Plavix (clopidogrel), last dose was 5 days                            prior to procedure. ASA Grade Assessment: III - A                            patient with severe systemic disease. After  reviewing the risks and benefits, the patient was                            deemed in satisfactory condition to undergo the                            procedure.                           - Prior to the procedure, a History and Physical                            was performed, and patient medications and                            allergies were reviewed. The patient's tolerance of                          previous anesthesia was also reviewed. The risks                            and benefits of the procedure and the sedation                            options and risks were discussed with the patient.                            All questions were answered, and informed consent                            was obtained. Prior Anticoagulants: The patient has                            taken Plavix (clopidogrel), last dose was 5 days                            prior to procedure. ASA Grade Assessment: III - A                            patient with severe systemic disease. After                            reviewing the risks and benefits, the patient was                            deemed in satisfactory condition to undergo the                            procedure.                           After obtaining informed consent, the colonoscope  was passed under direct vision. Throughout the                            procedure, the patient's blood pressure, pulse, and                            oxygen saturations were monitored continuously. The                            CF-HQ190L ([id number]) Olympus colonoscope was                            introduced through the anus and advanced to the 1                            cm into the ileum. The colonoscopy was performed                            without difficulty. The patient tolerated the                            procedure well. The quality of the bowel                            preparation was good. The ileocecal valve,                            appendiceal orifice, and rectum were photographed. Scope In: 7:59:17 AM Scope Out: 8:13:23 AM Scope Withdrawal Time: 0 hours 10 minutes 44 seconds  Total Procedure Duration: 0 hours 14 minutes 6 seconds  Findings:      Two sessile polyps were found in the mid sigmoid colon and proximal       ascending colon. The polyps were 4 to 6 mm in size. These polyps  were       removed with a cold snare. Resection and retrieval were complete.      A few small-mouthed diverticula were found in the sigmoid colon.      Non-bleeding external and internal hemorrhoids were found during       retroflexion. The hemorrhoids were small.      The terminal ileum appeared normal.      The exam was otherwise without abnormality on direct and retroflexion       views. Impression:               - Two 4 to 6 mm polyps in the mid sigmoid colon and                            in the proximal ascending colon, removed with a                            cold snare. Resected and retrieved.                           - Mild sigmoid diverticulosis                           -  Non-bleeding external and internal hemorrhoids.                           - Otherwise normal colonoscopy. No active bleeding. Recommendation:           - Return patient to hospital ward for ongoing care.                           - Resume previous diet.                           - Await pathology results.                           - Resume Plavix (clopidogrel) at prior dose                            tomorrow from GI standpoint.                           - Trend CBC.                           - Follow-up in GI clinic in 6 to 8 weeks                           - The findings and recommendations were discussed                            with the patient's daughter Leticia Penna).                           - We will sign off for now. Procedure Code(s):        --- Professional ---                           [phone], Colonoscopy, flexible; with removal of                            tumor(s), polyp(s), or other lesion(s) by snare                            technique Diagnosis Code(s):        --- Professional ---                           K63.5, Polyp of colon                           K64.8, Other hemorrhoids                           R19.5, Other fecal abnormalities                           K57.30, Diverticulosis  of large intestine without  perforation or abscess without bleeding CPT copyright 2019 American Medical Association. All rights reserved. The codes documented in this report are preliminary and upon coder review may  be revised to meet current compliance requirements. Jackquline Denmark, MD 04/08/2020 8:29:15 AM This report has been signed electronically. Number of Addenda: 0

## 2020-04-08 NOTE — Discharge Summary (Signed)
Name: Joelie Warner MRN: [id number] DOB: Dec 05, 1940 79 y.o. PCP: Cyndi Bender, PA-C  Date of Admission: 04/03/2020 11:48 AM Date of Discharge: 04/08/2020 Attending Physician: Velna Ochs, MD  Discharge Diagnosis: 1. Symptomatic anemia 2. CML  Discharge Medications: Allergies as of 04/08/2020      Reactions   Lisinopril Cough   Losartan Other (See Comments)   Patient reports nightmares/vivid dreams when taking.      Medication List    TAKE these medications   aspirin EC 81 MG tablet Take 1 tablet (81 mg total) by mouth daily.   atorvastatin 20 MG tablet Commonly known as: LIPITOR TAKE 1 TABLET (20 MG TOTAL) BY MOUTH DAILY AT 6 PM.   clopidogrel 75 MG tablet Commonly known as: Plavix Take 1 tablet (75 mg total) by mouth daily.   clorazepate 7.5 MG tablet Commonly known as: Tranxene-T Take 1 tablet (7.5 mg total) by mouth 2 (two) times daily as needed for anxiety. What changed: when to take this   digoxin 0.125 MG tablet Commonly known as: LANOXIN Take 0.5 tablets (0.0625 mg total) by mouth every other day.   Dulcolax 10 MG suppository Generic drug: bisacodyl Place 10 mg rectally as needed for moderate constipation.   furosemide 20 MG tablet Commonly known as: LASIX TAKE 2 TABLETS BY MOUTH IN THE MORNING AND AN EXTRA TABLET IF GAIN 3 OR MORE POUNDS IN 1 DAY What changed: See the new instructions.   glipiZIDE 10 MG 24 hr tablet Commonly known as: GLUCOTROL XL Take 10 mg by mouth daily.   Januvia 50 MG tablet Generic drug: sitaGLIPtin Take 50 mg by mouth daily.   metoprolol 200 MG 24 hr tablet Commonly known as: TOPROL-XL TAKE 1 TABLET BY MOUTH DAILY.   nilotinib 150 MG capsule Commonly known as: Tasigna Take 2 capsules (300 mg total) by mouth every 12 (twelve) hours. Take on an empty stomach, 1 hour before or 2 hours after meals.   oxyCODONE-acetaminophen 5-325 MG tablet Commonly known as: Percocet Take 1 tablet by mouth every 6 (six) hours  as needed.   pantoprazole 40 MG tablet Commonly known as: Protonix Take 1 tablet (40 mg total) by mouth daily.   polyethylene glycol 17 g packet Commonly known as: MIRALAX / GLYCOLAX Take 17 g by mouth daily.   spironolactone 25 MG tablet Commonly known as: ALDACTONE Take 0.5 tablets (12.5 mg total) by mouth 2 (two) times daily. What changed: when to take this   Synthroid 137 MCG tablet Generic drug: levothyroxine Take 137 mcg by mouth daily before breakfast.      Disposition and follow-up:   Sara Warner was discharged from [facility] in [place] condition.  At the hospital follow up visit please address:  1. Symptomatic anemia: Initial hemoglobin was 7.0 on admission after patient was told by her PCP to come to the hospital for blood transfusion. She required 4 units of PRBCs total during her hospitalization.  GI saw the patient and performed upper endoscopy and colonoscopy which did not show any acute bleeding. Only revealed moderate gastroduodenitis and sessile polyps that were removed. -Resumed aspirin 81 mg and Plavix 75 mg daily (patient expressed reluctance to continue these medications but we reinforced the importance of her taking this in the setting of her femoropopliteal bypass surgery) -Protonix 40 mg daily -F/u with GI in 6 to 8 weeks  2. CML: Patient had persistent leukocytosis during her hospitalization.  Given her history of CML and anemia on labs, there was  a concern of the possibility of recurrence. -F/u with Hematology upon d/c  3.  Labs / imaging needed at time of follow-up: CBC  4.  Pending labs/ test needing follow-up: EGD biopsy results   Follow-up Appointments:    Hospital Course by problem list: 1. Symptomatic anemia 2. CML  In summary, Sara Warner is a 79 year old female with past medical history significant for hypertension, HLD, CKD stage IIIb, HFrEF, CML and PVD status post left femoropopliteal bypass surgery in February  2021 who presented from her PCPs office with symptomatic anemia while on dual antiplatelet therapy.  The patient's hemoglobin was 7.0 at her PCPs office and was told to come to the ED for blood transfusion.  FOBT was positive in the ED.  During her clinical course, the patient received 4 units of PRBCs. GI was consulted and performed an EGD and colonoscopy after waiting 5 days for Plavix washout.  EGD demonstrated moderate gastroduodenitis, but no source of an acute bleed.  Biopsies were taken and are currently pending. Colonoscopy demonstrated 2 sessile polyps that were removed, some diverticulosis in the sigmoid colon, but no signs of bleeding or malignancy.  The patient was started on her aspirin and Plavix again in addition to omeprazole 40 mg daily and discharged with the plan to follow-up with GI in 6 to 8 weeks.  Of note, the patient had a mild leukocytosis during hospitalization and was instructed to follow-up with her hematologist in the outpatient setting.  Discharge Vitals:   BP (!) 147/52   Pulse 87   Temp 97.7 F (36.5 C) (Oral)   Resp 18   Ht 5\' 8"  (1.727 m)   Wt 78.5 kg   SpO2 98%   BMI 26.31 kg/m   Pertinent Labs, Studies, and Procedures:  CBC Latest Ref Rng & Units 04/08/2020 04/07/2020 04/06/2020  WBC 4.0 - 10.5 K/uL 14.1(H) 13.8(H) -  Hemoglobin 12.0 - 15.0 g/dL 10.0(L) 9.3(L) 9.1(L)  Hematocrit 36.0 - 46.0 % 33.6(L) 30.5(L) 29.9(L)  Platelets 150 - 400 K/uL 503(H) 503(H) -   BMP Latest Ref Rng & Units 04/08/2020 04/07/2020 04/06/2020  Glucose 70 - 99 mg/dL 147(H) 141(H) 151(H)  BUN 8 - 23 mg/dL 18 25(H) 33(H)  Creatinine 0.44 - 1.00 mg/dL 1.53(H) 1.50(H) 1.49(H)  BUN/Creat Ratio 12 - 28 - - -  Sodium 135 - 145 mmol/L 140 141 138  Potassium 3.5 - 5.1 mmol/L 3.5 3.5 3.2(L)  Chloride 98 - 111 mmol/L 106 105 104  CO2 22 - 32 mmol/L 23 25 25   Calcium 8.9 - 10.3 mg/dL 9.0 8.8(L) 8.5(L)   RUQ U/S IMPRESSION: Probable borderline hepatomegaly. No other abnormality seen in  the right upper quadrant of the abdomen.  Upper Endoscopy: IMPRESSION: - Small hiatal hernia. - Moderate gastroduodenitis. No active UGI bleeding.  Colonoscopy IMPRESSION: - Two 4 to 6 mm polyps in the mid sigmoid colon and in the proximal ascending colon, removed with a cold snare. Resected and retrieved. - Mild sigmoid diverticulosis - Non-bleeding external and internal hemorrhoids. - Otherwise normal colonoscopy. No active bleeding.  Discharge Instructions: Discharge Instructions    Call MD for:  difficulty breathing, headache or visual disturbances   Complete by: As directed    Call MD for:  extreme fatigue   Complete by: As directed    Call MD for:  hives   Complete by: As directed    Call MD for:  persistant dizziness or light-headedness   Complete by: As directed    Call MD for:  persistant nausea and vomiting   Complete by: As directed    Call MD for:  severe uncontrolled pain   Complete by: As directed    Call MD for:  temperature >100.4   Complete by: As directed    Diet - low sodium heart healthy   Complete by: As directed    Discharge instructions   Complete by: As directed    Thank you for allowing [place] to take care of you during your hospitalization.  Below is a summary of what we treated:  1.  Anemia -We think your anemia was likely due to a GI bleed.  The GI doctors did an endoscopy and colonoscopy and were not able to find the source of the bleeding.  They would like you to follow-up with him in 6 to 8 weeks. -Please continue your aspirin and Plavix daily.  2.  CML -Please follow-up with your hematologist to make sure your CML is doing okay.  3.  Follow-up -Please schedule a follow-up appointment with your primary care provider as soon as possible to discuss your medications and recent hospitalization. -Follow-up with the GI doctors in 6 to 8 weeks   Increase activity slowly   Complete by: As directed      Signed: Earlene Plater, MD Internal  Medicine, PGY1 Pager: [phone]  04/09/2020,11:00 AM

## 2020-04-08 NOTE — Transfer of Care (Signed)
Immediate Anesthesia Transfer of Care Note  Patient: Sara Warner  Procedure(s) Performed: ESOPHAGOGASTRODUODENOSCOPY (EGD) WITH PROPOFOL (N/A ) COLONOSCOPY WITH PROPOFOL (N/A ) BIOPSY POLYPECTOMY  Patient Location: PACU  Anesthesia Type:MAC  Level of Consciousness: sedated and responds to stimulation  Airway & Oxygen Therapy: Patient Spontanous Breathing and Patient connected to nasal cannula oxygen  Post-op Assessment: Report given to RN and Post -op Vital signs reviewed and stable  Post vital signs: Reviewed and stable  Last Vitals:  Vitals Value Taken Time  BP    Temp    Pulse    Resp    SpO2      Last Pain:  Vitals:   04/08/20 0704  TempSrc: Oral  PainSc: 0-No pain         Complications: No apparent anesthesia complications

## 2020-04-08 NOTE — Progress Notes (Signed)
   Subjective: Pt is a 79-yr-old woman with a PMH significant for HFrEF, HLD, CML, HTN, Afib, PVD, and femoral bypass surgery who presented to the hospital with fatigue and SOB on exertion. Pt is feeling well today. Denies CP, SOB, fatigue, and nosebleeds.  Objective:  Vital signs in last 24 hours: Vitals:   04/08/20 0704 04/08/20 0820 04/08/20 0830 04/08/20 0840  BP: (!) 143/59 (!) 104/46 (!) 148/60 (!) 147/52  Pulse: 87 75 79 87  Resp: 13 (!) 23 15 18   Temp: 98.1 F (36.7 C) 97.7 F (36.5 C)    TempSrc: Oral Oral    SpO2: 99% 98% 99% 98%  Weight: 78.5 kg     Height: 5\' 8"  (1.727 m)      General: Pt is well-appearing and in NAD HEENT: /AT. Grossly EOMI. No epistaxis. Cardiac:Irregular rhythm. No murmurs, rubs, or gallops Respiratory:Lungs areCTAbilaterally. No wheezes, rales, or rhonchi Abdominal:Soft and non-tender with normoactive bowel sounds Neuro: Alert and oriented x3. No gross neurologic deficits noted Extremities: spontaneously moving all extremities Skin: Warm and dry. No erythema or rashes. Several seborrheic keratoses noted on back and chest  Assessment/Plan:  Active Problems:   Symptomatic anemia   Epistaxis   Occult blood in stools   Antiplatelet or antithrombotic long-term use  Ptis a 79 year old female with aPMHsignificant for HTN, HLD, CML, Afib, CKD stage 3, HFrEF, and femoral bypass surgery in Feb 2021 who presented to the hospital with fatigue and SOB on exertion. Hgb on admission was 7.0 and has trended upward. Hgb is 10.0 today. Pt has responded well to blood transfusions and is no longer symptomatic. EGD and colonoscopy were performed, and no active bleeding was found with either procedure.  Symptomatic anemia:Hgb was 10.0 today with MCV of 80.2. She denies fatigue, chest pain, SOB, and nosebleeds.Anemia has improved after blood transfusions. On admission, FOBT was positive, suggesting GI bleed. However, EGD and colonoscopy showed no active  bleeding. It is possible that pt had a minor GI bleed when she was admitted that resolved prior to GI procedures. It is also possible that anemia was due to CML recurrence. Anemia is likely multifactorial. -Resume aspirin 81 mg QD -Resume Plavix 75 mg QD  Epistaxis: Resolved.  Gastritis: Found on EGD. No active upper GI tract bleeding. -Pantoprazole 40 mg QD  CML: Last WBC 14.1. Given history of CML and anemia on labs, pt is encouraged to follow up with oncologist concerning possibility of recurrence. -Nilotinib 150 mg BID  HFrEF (EF 45-50% 01/2018) HTN: -Metoprolol XL 200 mgQD -Digoxin 0.0625 mg every other day -Spironolactone 12.5 mgQD -Lasix 20 mg BID  HLD: -Atorvastatin 20 mgQD  CKD stage III: Pt baseline creatinine of 1.67 on admission, remained stable throughout stay. Cr 1.53 today, BUN 18, and GFR 32  T2DM:Last glucose 141.Pttakes Januvia 50 mg daily and glipizide 10 mg dailyat home. -Januvia 50 mg QD -Glipizide 10 mg QD  Hypothyroidism: -Levothyroxine 114mcg QD  Prior to Admission Living Arrangement: Home Anticipated Discharge Location: Home Barriers to Discharge: None Dispo: Discharge today  Sara Warner, Medical Student 04/08/2020, 11:02 AM Pager: [phone]

## 2020-04-08 NOTE — Progress Notes (Signed)
Provider made aware of positive MRSA. No new orders.

## 2020-04-08 NOTE — Progress Notes (Signed)
Pt stable at discharge. Vitals WDL. Pt had no complaints. Daughter received medications. Pt assisted to front entrance for discharge via Wheelchair. Transported him by children

## 2020-04-08 NOTE — Op Note (Signed)
[facility] Patient Name: Sara Warner Procedure Date : 04/08/2020 MRN: [id number] Attending MD: Jackquline Denmark , MD Date of Birth: [birth year] CSN: [id number] Age: 79 Admit Type: Inpatient Procedure:                Upper GI endoscopy Indications:              Heme positive stool Providers:                Jackquline Denmark, MD, Grace Isaac, RN, Doristine Johns, RN, Laverda Sorenson, Technician Referring MD:              Medicines:                Monitored Anesthesia Care Complications:            No immediate complications. Estimated Blood Loss:     Estimated blood loss: none. Procedure:                Pre-Anesthesia Assessment:                           - Prior to the procedure, a History and Physical                            was performed, and patient medications and                            allergies were reviewed. The patient's tolerance of                            previous anesthesia was also reviewed. The risks                            and benefits of the procedure and the sedation                            options and risks were discussed with the patient.                            All questions were answered, and informed consent                            was obtained. Prior Anticoagulants: The patient has                            taken Plavix (clopidogrel), last dose was 5 days                            prior to procedure. ASA Grade Assessment: III - A                            patient with severe systemic disease. After  reviewing the risks and benefits, the patient was                            deemed in satisfactory condition to undergo the                            procedure.                           After obtaining informed consent, the endoscope was                            passed under direct vision. Throughout the                            procedure, the patient's blood pressure,  pulse, and                            oxygen saturations were monitored continuously. The                            GIF-H190 ([id number]) Olympus gastroscope was                            introduced through the mouth, and advanced to the                            second part of duodenum. The upper GI endoscopy was                            accomplished without difficulty. The patient                            tolerated the procedure well. Scope In: Scope Out: Findings:      The examined esophagus was normal with well-defined Z-line at 38 cm.      A small hiatal hernia was present.      Localized moderate inflammation characterized by erythema was found in       the gastric antrum. Biopsies were taken with a cold forceps for       histology.      Localized mildly erythematous mucosa without active bleeding and with no       stigmata of bleeding was found in the first portion of the duodenum.       Biopsies for histology were taken with a cold forceps for evaluation of       celiac disease. Impression:               - Small hiatal hernia.                           - Moderate gastroduodenitis. No active UGI bleeding. Recommendation:           - Return patient to hospital ward for ongoing care.                           - Resume previous  diet.                           - Use Protonix (pantoprazole) 40 mg PO daily.                           - Proceed with colonoscopy. Procedure Code(s):        --- Professional ---                           [phone], Esophagogastroduodenoscopy, flexible,                            transoral; with biopsy, single or multiple Diagnosis Code(s):        --- Professional ---                           K44.9, Diaphragmatic hernia without obstruction or                            gangrene                           K29.70, Gastritis, unspecified, without bleeding                           K31.89, Other diseases of stomach and duodenum                           R19.5,  Other fecal abnormalities CPT copyright 2019 American [employer] Association. All rights reserved. The codes documented in this report are preliminary and upon coder review may  be revised to meet current compliance requirements. Jackquline Denmark, MD 04/08/2020 8:22:02 AM This report has been signed electronically. Number of Addenda: 0

## 2020-04-08 NOTE — Interval H&P Note (Signed)
History and Physical Interval Note:  04/08/2020 7:36 AM  Sara Warner  has presented today for surgery, with the diagnosis of Anemia, FOBT +.  The various methods of treatment have been discussed with the patient and family. After consideration of risks, benefits and other options for treatment, the patient has consented to  Procedure(s): ESOPHAGOGASTRODUODENOSCOPY (EGD) WITH PROPOFOL (N/A) COLONOSCOPY WITH PROPOFOL (N/A) as a surgical intervention.  The patient's history has been reviewed, patient examined, no change in status, stable for surgery.  I have reviewed the patient's chart and labs.  Questions were answered to the patient's satisfaction.     Jackquline Denmark

## 2020-04-08 NOTE — Anesthesia Postprocedure Evaluation (Signed)
Anesthesia Post Note  Patient: Christia Domke  Procedure(s) Performed: ESOPHAGOGASTRODUODENOSCOPY (EGD) WITH PROPOFOL (N/A ) COLONOSCOPY WITH PROPOFOL (N/A ) BIOPSY POLYPECTOMY     Patient location during evaluation: PACU Anesthesia Type: MAC Level of consciousness: awake and alert and oriented Pain management: pain level controlled Vital Signs Assessment: post-procedure vital signs reviewed and stable Respiratory status: spontaneous breathing, nonlabored ventilation and respiratory function stable Cardiovascular status: blood pressure returned to baseline Postop Assessment: no apparent nausea or vomiting Anesthetic complications: no    Last Vitals:  Vitals:   04/08/20 0704 04/08/20 0820  BP: (!) 143/59 (!) 104/46  Pulse: 87 75  Resp: 13 (!) 23  Temp: 36.7 C 36.5 C  SpO2: 99% 98%    Last Pain:  Vitals:   04/08/20 0820  TempSrc: Oral  PainSc: 0-No pain                 Brennan Bailey

## 2020-04-09 ENCOUNTER — Other Ambulatory Visit: Payer: Self-pay | Admitting: Physician Assistant

## 2020-04-09 LAB — SURGICAL PATHOLOGY

## 2020-04-17 DIAGNOSIS — I739 Peripheral vascular disease, unspecified: Secondary | ICD-10-CM | POA: Diagnosis not present

## 2020-04-17 DIAGNOSIS — D649 Anemia, unspecified: Secondary | ICD-10-CM | POA: Diagnosis not present

## 2020-04-17 DIAGNOSIS — K2990 Gastroduodenitis, unspecified, without bleeding: Secondary | ICD-10-CM | POA: Diagnosis not present

## 2020-04-17 DIAGNOSIS — C9110 Chronic lymphocytic leukemia of B-cell type not having achieved remission: Secondary | ICD-10-CM | POA: Diagnosis not present

## 2020-04-17 DIAGNOSIS — E119 Type 2 diabetes mellitus without complications: Secondary | ICD-10-CM | POA: Diagnosis not present

## 2020-04-18 ENCOUNTER — Telehealth: Payer: Self-pay

## 2020-04-18 ENCOUNTER — Telehealth: Payer: Self-pay | Admitting: Oncology

## 2020-04-18 NOTE — Telephone Encounter (Signed)
Scheduled appt per 5/20 sch message - unable to reach - left message with appt date and time

## 2020-04-18 NOTE — Telephone Encounter (Signed)
-----   Message from Wyatt Portela, MD sent at 04/18/2020 11:01 AM EDT ----- I sent a message to scheduling for 5/24. Thanks ----- Message ----- From: Tami Lin, RN Sent: 04/18/2020  10:43 AM EDT To: Wyatt Portela, MD  Patient called to let you know that she was recently admitted for anemia and was instructed to inform you that she had to receive 4 units of blood while admitted. She was instructed to schedule an appointment with you. Her last visit with you was 04/08/2019. She was supposed to follow up in 6 months but did not.  Lanelle Bal

## 2020-04-18 NOTE — Telephone Encounter (Signed)
Called patient and made aware to expect a call from the scheduling department.

## 2020-04-22 ENCOUNTER — Other Ambulatory Visit: Payer: Self-pay

## 2020-04-22 ENCOUNTER — Inpatient Hospital Stay: Payer: PPO

## 2020-04-22 ENCOUNTER — Inpatient Hospital Stay: Payer: PPO | Attending: Oncology | Admitting: Oncology

## 2020-04-22 VITALS — BP 140/68 | HR 92 | Temp 97.7°F | Resp 18 | Ht 68.0 in | Wt 185.4 lb

## 2020-04-22 DIAGNOSIS — Z79899 Other long term (current) drug therapy: Secondary | ICD-10-CM | POA: Insufficient documentation

## 2020-04-22 DIAGNOSIS — D509 Iron deficiency anemia, unspecified: Secondary | ICD-10-CM | POA: Diagnosis not present

## 2020-04-22 DIAGNOSIS — I739 Peripheral vascular disease, unspecified: Secondary | ICD-10-CM | POA: Insufficient documentation

## 2020-04-22 DIAGNOSIS — D500 Iron deficiency anemia secondary to blood loss (chronic): Secondary | ICD-10-CM | POA: Insufficient documentation

## 2020-04-22 DIAGNOSIS — C9210 Chronic myeloid leukemia, BCR/ABL-positive, not having achieved remission: Secondary | ICD-10-CM

## 2020-04-22 DIAGNOSIS — C9211 Chronic myeloid leukemia, BCR/ABL-positive, in remission: Secondary | ICD-10-CM | POA: Insufficient documentation

## 2020-04-22 LAB — CBC WITH DIFFERENTIAL (CANCER CENTER ONLY)
Abs Immature Granulocytes: 0.27 10*3/uL — ABNORMAL HIGH (ref 0.00–0.07)
Basophils Absolute: 0.2 10*3/uL — ABNORMAL HIGH (ref 0.0–0.1)
Basophils Relative: 1 %
Eosinophils Absolute: 0.4 10*3/uL (ref 0.0–0.5)
Eosinophils Relative: 3 %
HCT: 29.8 % — ABNORMAL LOW (ref 36.0–46.0)
Hemoglobin: 8.8 g/dL — ABNORMAL LOW (ref 12.0–15.0)
Immature Granulocytes: 2 %
Lymphocytes Relative: 14 %
Lymphs Abs: 2 10*3/uL (ref 0.7–4.0)
MCH: 23.2 pg — ABNORMAL LOW (ref 26.0–34.0)
MCHC: 29.5 g/dL — ABNORMAL LOW (ref 30.0–36.0)
MCV: 78.6 fL — ABNORMAL LOW (ref 80.0–100.0)
Monocytes Absolute: 1 10*3/uL (ref 0.1–1.0)
Monocytes Relative: 7 %
Neutro Abs: 10.3 10*3/uL — ABNORMAL HIGH (ref 1.7–7.7)
Neutrophils Relative %: 73 %
Platelet Count: 580 10*3/uL — ABNORMAL HIGH (ref 150–400)
RBC: 3.79 MIL/uL — ABNORMAL LOW (ref 3.87–5.11)
RDW: 28.4 % — ABNORMAL HIGH (ref 11.5–15.5)
WBC Count: 14.2 10*3/uL — ABNORMAL HIGH (ref 4.0–10.5)
nRBC: 0 % (ref 0.0–0.2)

## 2020-04-22 LAB — CMP (CANCER CENTER ONLY)
ALT: 13 U/L (ref 0–44)
AST: 12 U/L — ABNORMAL LOW (ref 15–41)
Albumin: 3.9 g/dL (ref 3.5–5.0)
Alkaline Phosphatase: 55 U/L (ref 38–126)
Anion gap: 10 (ref 5–15)
BUN: 32 mg/dL — ABNORMAL HIGH (ref 8–23)
CO2: 25 mmol/L (ref 22–32)
Calcium: 8.8 mg/dL — ABNORMAL LOW (ref 8.9–10.3)
Chloride: 105 mmol/L (ref 98–111)
Creatinine: 1.84 mg/dL — ABNORMAL HIGH (ref 0.44–1.00)
GFR, Est AFR Am: 30 mL/min — ABNORMAL LOW (ref >60)
GFR, Estimated: 26 mL/min — ABNORMAL LOW (ref >60)
Glucose, Bld: 164 mg/dL — ABNORMAL HIGH (ref 70–99)
Potassium: 3.9 mmol/L (ref 3.5–5.1)
Sodium: 140 mmol/L (ref 135–145)
Total Bilirubin: 0.8 mg/dL (ref 0.3–1.2)
Total Protein: 6.8 g/dL (ref 6.5–8.1)

## 2020-04-22 NOTE — Progress Notes (Signed)
Hematology and Oncology Follow Up Visit  Sara Warner [id number] May 08, 1941 79 y.o. 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   Current treatment: Tasigna 300 mg bid started 11/2012 with complete hematological and molecular remission.  Interim History: Sara Warner is here for a follow-up visit.  Since the last visit, she underwent femoral to popliteal bypass surgery because of peripheral vascular disease and nonhealing ulcers.  She subsequently developed bleeding complication and underwent exploration of that wound and application of a wound VAC in February 2021.  She was hospitalized again on May 5 after presenting with symptomatic anemia.  GI work-up including endoscopy and colonoscopy revealed moderate to gastroenteritis without any malignancy.  She received 4 units of packed red blood cells after holding aspirin and Plavix which was resumed on discharge.  Since her discharge, she has been taking oral iron daily with hemoglobin slightly declined from discharge of 10.0 currently to 8.8.  She does report some fatigue tiredness but no GI bleeding.  She denies any hematochezia melena or hemoptysis.    She denies any complications to to Tasigna which she has resumed taking.  She denies excessive fatigue tiredness.  She denies any lymphadenopathy petechiae.      Medications: Updated on review. Current Outpatient Medications  Medication Sig Dispense Refill  . aspirin EC 81 MG tablet Take 1 tablet (81 mg total) by mouth daily.    Marland Kitchen atorvastatin (LIPITOR) 20 MG tablet TAKE 1 TABLET (20 MG TOTAL) BY MOUTH DAILY AT 6 PM. (Patient not taking: Reported on 04/03/2020) 90 tablet 1  . bisacodyl (DULCOLAX) 10 MG suppository Place 10 mg rectally as needed for moderate constipation.    . clopidogrel (PLAVIX) 75 MG tablet Take 1 tablet (75 mg total) by mouth daily. 30 tablet 11  . clorazepate  (TRANXENE-T) 7.5 MG tablet Take 1 tablet (7.5 mg total) by mouth 2 (two) times daily as needed for anxiety. (Patient taking differently: Take 7.5 mg by mouth daily as needed for anxiety. ) 30 tablet 1  . digoxin (LANOXIN) 0.125 MG tablet Take 0.5 tablets (0.0625 mg total) by mouth every other day. 45 tablet 1  . furosemide (LASIX) 20 MG tablet TAKE 2 TABLETS BY MOUTH IN THE MORNING AND AN EXTRA TABLET IF GAIN 3 OR MORE POUNDS IN 1 DAY 270 tablet 1  . glipiZIDE (GLUCOTROL XL) 10 MG 24 hr tablet Take 10 mg by mouth daily.     . metoprolol (TOPROL-XL) 200 MG 24 hr tablet TAKE 1 TABLET BY MOUTH DAILY. (Patient taking differently: Take 200 mg by mouth daily. ) 90 tablet 2  . nilotinib (TASIGNA) 150 MG capsule Take 2 capsules (300 mg total) by mouth every 12 (twelve) hours. Take on an empty stomach, 1 hour before or 2 hours after meals. 120 capsule 2  . oxyCODONE-acetaminophen (PERCOCET) 5-325 MG tablet Take 1 tablet by mouth every 6 (six) hours as needed. (Patient not taking: Reported on 02/13/2020) 20 tablet 0  . pantoprazole (PROTONIX) 40 MG tablet Take 1 tablet (40 mg total) by mouth daily. 30 tablet 1  . polyethylene glycol (MIRALAX / GLYCOLAX) 17 g packet Take 17 g by mouth daily.    . sitaGLIPtin (JANUVIA) 50 MG tablet Take 50 mg by mouth daily.     Marland Kitchen spironolactone (ALDACTONE) 25 MG tablet Take 0.5 tablets (12.5 mg total) by mouth 2 (two) times daily. (Patient taking differently: Take 12.5 mg by mouth every evening. )  90 tablet 3  . SYNTHROID 137 MCG tablet Take 137 mcg by mouth daily before breakfast.     No current facility-administered medications for this visit.    Allergies:  Allergies  Allergen Reactions  . Lisinopril Cough  . Losartan Other (See Comments)    Patient reports nightmares/vivid dreams when taking.       Physical Exam:  Blood pressure 140/68, pulse 92, temperature 97.7 F (36.5 C), temperature source Temporal, resp. rate 18, height 5\' 8"  (1.727 m), weight 185 lb 6.4  oz (84.1 kg), SpO2 98 %.   ECOG: 1     General appearance: Alert, awake without any distress. Head: Atraumatic without abnormalities Oropharynx: Without any thrush or ulcers. Eyes: No scleral icterus. Lymph nodes: No lymphadenopathy noted in the cervical, supraclavicular, or axillary nodes Heart:regular rate and rhythm, without any murmurs or gallops.   Lung: Clear to auscultation without any rhonchi, wheezes or dullness to percussion. Abdomin: Soft, nontender without any shifting dullness or ascites. Musculoskeletal: No clubbing or cyanosis. Neurological: No motor or sensory deficits. Skin: No rashes or lesions.     Lab Results: Lab Results  Component Value Date   WBC 14.2 (H) 04/22/2020   HGB 8.8 (L) 04/22/2020   HCT 29.8 (L) 04/22/2020   MCV 78.6 (L) 04/22/2020   PLT 580 (H) 04/22/2020     Chemistry      Component Value Date/Time   NA 140 04/08/2020 0938   NA 140 03/22/2018 0957   NA 140 09/08/2017 1417   K 3.5 04/08/2020 0938   K 4.0 09/08/2017 1417   CL 106 04/08/2020 0938   CL 106 04/26/2013 1252   CO2 23 04/08/2020 0938   CO2 24 09/08/2017 1417   BUN 18 04/08/2020 0938   BUN 28 (H) 03/22/2018 0957   BUN 26.9 (H) 09/08/2017 1417   CREATININE 1.53 (H) 04/08/2020 0938   CREATININE 2.02 (H) 04/28/2019 1307   CREATININE 1.4 (H) 09/08/2017 1417      Component Value Date/Time   CALCIUM 9.0 04/08/2020 0938   CALCIUM 9.5 09/08/2017 1417   ALKPHOS 49 04/03/2020 1800   ALKPHOS 44 09/08/2017 1417   AST 14 (L) 04/03/2020 1800   AST 14 (L) 04/28/2019 1307   AST 19 09/08/2017 1417   ALT 14 04/03/2020 1800   ALT 18 04/28/2019 1307   ALT 23 09/08/2017 1417   BILITOT 0.9 04/03/2020 1800   BILITOT 1.2 04/28/2019 1307   BILITOT 0.65 09/08/2017 1417         Impression and Plan:   79 year old woman with:   1.  Chronic phase CML presented with leukocytosis diagnosed in 2013.  She continues to be in molecular and hematological remission on current  regimen.  Risks and benefits of continuing this treatment long-term were reviewed today.  Potential complications include cardiovascular disease, hypertension among others were discussed.  At this time we will continue with the same dose and schedule..  2.  Iron deficiency anemia: Related to blood loss from her recent surgery.  Despite transfusions her hemoglobin still around 8.8 with MCV still low at 78.  It is very likely that she is still iron deficient at this time despite oral iron.  Risks and benefits of intravenous iron were reviewed.  Potential complications that include arthralgias, myalgias and infusion related complications were discussed.  I will arrange for Feraheme infusion in the near future.  She is agreeable to proceed with this approach.  3.  Peripheral vascular disease: She is status post  bypass surgery with improvement in her wound healing.  No more bleeding noted.  4. Follow-up: She will return in 3 months for repeat evaluation of her iron studies.  30  minutes were dedicated to this visit. The time was spent on reviewing laboratory data, discussing treatment options, and answering questions regarding future plan.   Zola Button 5/24/202110:16 AM

## 2020-04-24 ENCOUNTER — Telehealth: Payer: Self-pay | Admitting: Oncology

## 2020-04-24 NOTE — Telephone Encounter (Signed)
Scheduled appt per 5/24 los.  Spoke with pt and she is aware of her next scheduled appt date and time.

## 2020-04-30 ENCOUNTER — Inpatient Hospital Stay: Payer: PPO

## 2020-04-30 ENCOUNTER — Other Ambulatory Visit: Payer: Self-pay

## 2020-04-30 ENCOUNTER — Inpatient Hospital Stay: Payer: PPO | Attending: Oncology

## 2020-04-30 VITALS — BP 136/62 | HR 73 | Temp 98.2°F | Resp 17

## 2020-04-30 DIAGNOSIS — D509 Iron deficiency anemia, unspecified: Secondary | ICD-10-CM | POA: Insufficient documentation

## 2020-04-30 DIAGNOSIS — C9211 Chronic myeloid leukemia, BCR/ABL-positive, in remission: Secondary | ICD-10-CM | POA: Diagnosis not present

## 2020-04-30 LAB — IRON AND TIBC
Iron: 24 ug/dL — ABNORMAL LOW (ref 28–170)
Saturation Ratios: 5 % — ABNORMAL LOW (ref 10.4–31.8)
TIBC: 482 ug/dL — ABNORMAL HIGH (ref 250–450)
UIBC: 458 ug/dL

## 2020-04-30 LAB — FERRITIN: Ferritin: 21 ng/mL (ref 11–307)

## 2020-04-30 MED ORDER — SODIUM CHLORIDE 0.9 % IV SOLN
510.0000 mg | Freq: Once | INTRAVENOUS | Status: AC
  Administered 2020-04-30: 510 mg via INTRAVENOUS
  Filled 2020-04-30: qty 510

## 2020-04-30 MED ORDER — SODIUM CHLORIDE 0.9 % IV SOLN
Freq: Once | INTRAVENOUS | Status: AC
  Filled 2020-04-30: qty 250

## 2020-04-30 NOTE — Patient Instructions (Signed)

## 2020-05-03 ENCOUNTER — Ambulatory Visit (INDEPENDENT_AMBULATORY_CARE_PROVIDER_SITE_OTHER)
Admission: RE | Admit: 2020-05-03 | Discharge: 2020-05-03 | Disposition: A | Discharge status: Home / Self Care | Payer: PPO | Source: Ambulatory Visit | Attending: Vascular Surgery | Admitting: Vascular Surgery

## 2020-05-03 ENCOUNTER — Other Ambulatory Visit: Payer: Self-pay

## 2020-05-03 ENCOUNTER — Ambulatory Visit (HOSPITAL_COMMUNITY)
Admission: RE | Admit: 2020-05-03 | Discharge: 2020-05-03 | Disposition: A | Discharge status: Home / Self Care | Payer: PPO | Source: Ambulatory Visit | Attending: Vascular Surgery | Admitting: Vascular Surgery

## 2020-05-03 DIAGNOSIS — I739 Peripheral vascular disease, unspecified: Secondary | ICD-10-CM

## 2020-05-07 ENCOUNTER — Other Ambulatory Visit: Payer: Self-pay

## 2020-05-07 ENCOUNTER — Inpatient Hospital Stay: Payer: PPO

## 2020-05-07 ENCOUNTER — Encounter: Payer: Self-pay | Admitting: Vascular Surgery

## 2020-05-07 ENCOUNTER — Encounter: Payer: Self-pay | Admitting: Family Medicine

## 2020-05-07 ENCOUNTER — Ambulatory Visit (INDEPENDENT_AMBULATORY_CARE_PROVIDER_SITE_OTHER): Payer: PPO | Admitting: Vascular Surgery

## 2020-05-07 VITALS — BP 139/63 | HR 71 | Temp 98.3°F | Resp 18

## 2020-05-07 VITALS — BP 144/64 | HR 81 | Temp 97.2°F | Resp 14 | Ht 68.0 in | Wt 177.0 lb

## 2020-05-07 DIAGNOSIS — D509 Iron deficiency anemia, unspecified: Secondary | ICD-10-CM

## 2020-05-07 DIAGNOSIS — I998 Other disorder of circulatory system: Secondary | ICD-10-CM

## 2020-05-07 DIAGNOSIS — I70229 Atherosclerosis of native arteries of extremities with rest pain, unspecified extremity: Secondary | ICD-10-CM

## 2020-05-07 MED ORDER — SODIUM CHLORIDE 0.9 % IV SOLN
Freq: Once | INTRAVENOUS | Status: AC
  Filled 2020-05-07: qty 250

## 2020-05-07 MED ORDER — SODIUM CHLORIDE 0.9 % IV SOLN
510.0000 mg | Freq: Once | INTRAVENOUS | Status: AC
  Administered 2020-05-07: 510 mg via INTRAVENOUS
  Filled 2020-05-07: qty 510

## 2020-05-07 NOTE — Progress Notes (Signed)
Patient name: Sara Warner MRN: [id number] DOB: 1941/01/19 Sex: female  REASON FOR VISIT: 3 month follow-up for surveillance  HPI: Sara Warner is a 79 y.o. female that presents for ongoing follow-up after right common femoral to below knee pop bypass on 12/15/2019 with great saphenous vein for critical limb ischemia with tissue loss.  She also underwent a right external iliac angioplasty with stent using several Lutonix stents on 12/06/2019 prior to her bypass.  Prior to surgery she had an ABI 0.2 with a right great toe ulcer.  She did require washout of the below-knee popliteal incision on 01/10/2020 with VAC placement after presenting to the ED.  On surveillance today reports that her right leg is doing well.  She has no new wounds or other problems with the right foot.  The previous ulcer is healed.  She does note that her left fourth toe has an ulceration now has been present for about a month and nonhealing.  Past Medical History:  Diagnosis Date  . Atrial fibrillation ([place])   . Chronic atrial fibrillation ([place])    a. Refuses [place].  CHA2DS2VASc = 4.  . Chronic systolic CHF (congestive heart failure) ([place])    a. 05/2015 Echo: EF 35-40%.  . CKD (chronic kidney disease), stage III   . CML (chronic myelocytic leukemia) ([place]) 01/10/2013  . DM (diabetes mellitus) ([place])   . Dysrhythmia   . Elevated WBC count   . Epistaxis 04/03/2020   AFTER COVID SWAB  . Hypertension   . Hypertensive heart disease   . Hypothyroidism   . NICM (nonischemic cardiomyopathy) ([place])    a. 06/2013 MV: low risk study w/o ischemia;  b. 05/2015 Echo: EF 35-40%, diff HK, basal-midinferoseptal and basal-midanteroseptal AK. Mild-mod MR, mod dil LA, mild to mod TR, PASP 74mmHg.  Marland Kitchen PAD (peripheral artery disease) ([place])   . Pneumonia    30 yrs. ago  . Toxic goiter     Past Surgical History:  Procedure Laterality Date  . ABDOMINAL AORTOGRAM W/LOWER EXTREMITY Bilateral 12/06/2019   Procedure: ABDOMINAL AORTOGRAM  W/LOWER EXTREMITY;  Surgeon: [name], MD;  Location: [place] [facility];  Service: Cardiovascular;  Laterality: Bilateral;  . ABDOMINAL HYSTERECTOMY    . APPLICATION OF WOUND VAC  01/10/2020   BLEEDING FROM FEMORAL BYPASS  . BIOPSY  04/08/2020   Procedure: BIOPSY;  Surgeon: Jackquline Denmark, MD;  Location: [facility] ENDOSCOPY;  Service: Endoscopy;;  . CARDIOVERSION    . COLONOSCOPY WITH PROPOFOL N/A 04/08/2020   Procedure: COLONOSCOPY WITH PROPOFOL;  Surgeon: Jackquline Denmark, MD;  Location[facility] ENDOSCOPY;  Service: Endoscopy;  Laterality: N/A;  . CYST EXCISION     removed on right ovary  . CYSTECTOMY    . ESOPHAGOGASTRODUODENOSCOPY (EGD) WITH PROPOFOL N/A 04/08/2020   Procedure: ESOPHAGOGASTRODUODENOSCOPY (EGD) WITH PROPOFOL;  Surgeon: Jackquline Denmark, MD;  Location: [facility] ENDOSCOPY;  Service: Endoscopy;  Laterality: N/A;  . FEMORAL ARTERY - POPLITEAL ARTERY BYPASS GRAFT  12/15/2019   Right common femoral artery to below-knee popliteal artery bypass with ipsilateral nonreversed great saphenous vein  . FEMORAL-POPLITEAL BYPASS GRAFT Right 12/15/2019   Procedure: BYPASS GRAFT FEMORAL-POPLITEAL ARTERY;  Surgeon: [name], MD;  Location: [place];  Service: Vascular;  Laterality: Right;  . HYSTEROTOMY    . PERIPHERAL VASCULAR INTERVENTION Right 12/06/2019   Procedure: PERIPHERAL VASCULAR INTERVENTION;  Surgeon: [name], MD;  Location: [place] CV LAB;  Service: Cardiovascular;  Laterality: Right;  EXT ILIAC  . POLYPECTOMY  04/08/2020   Procedure:  POLYPECTOMY;  Surgeon: Jackquline Denmark, MD;  Location: [facility] ENDOSCOPY;  Service: Endoscopy;;  . WOUND EXPLORATION Right 01/10/2020   Procedure: right lower leg Wound Exploration with wound vac application;  Surgeon: Serafina Mitchell, MD;  Location: [facility] OR;  Service: Vascular;  Laterality: Right;    Family History  Problem Relation Age of Onset  . Cancer - Colon Mother        mast to lungs  . Diabetes Father   . Diabetes Paternal  Grandmother     SOCIAL HISTORY: Social History   Tobacco Use  . Smoking status: Former Smoker    Types: Cigarettes    Quit date: 01/01/1984    Years since quitting: 36.3  . Smokeless tobacco: Never Used  Substance Use Topics  . Alcohol use: No    Allergies  Allergen Reactions  . Lisinopril Cough  . Losartan Other (See Comments)    Patient reports nightmares/vivid dreams when taking.    Current Outpatient Medications  Medication Sig Dispense Refill  . atorvastatin (LIPITOR) 20 MG tablet TAKE 1 TABLET (20 MG TOTAL) BY MOUTH DAILY AT 6 PM. 90 tablet 1  . bisacodyl (DULCOLAX) 10 MG suppository Place 10 mg rectally as needed for moderate constipation.    . clorazepate (TRANXENE-T) 7.5 MG tablet Take 1 tablet (7.5 mg total) by mouth 2 (two) times daily as needed for anxiety. (Patient taking differently: Take 7.5 mg by mouth daily as needed for anxiety. ) 30 tablet 1  . digoxin (LANOXIN) 0.125 MG tablet Take 0.5 tablets (0.0625 mg total) by mouth every other day. 45 tablet 1  . furosemide (LASIX) 20 MG tablet TAKE 2 TABLETS BY MOUTH IN THE MORNING AND AN EXTRA TABLET IF GAIN 3 OR MORE POUNDS IN 1 DAY 270 tablet 1  . glipiZIDE (GLUCOTROL XL) 10 MG 24 hr tablet Take 10 mg by mouth daily.     . metoprolol (TOPROL-XL) 200 MG 24 hr tablet TAKE 1 TABLET BY MOUTH DAILY. (Patient taking differently: Take 200 mg by mouth daily. ) 90 tablet 2  . nilotinib (TASIGNA) 150 MG capsule Take 2 capsules (300 mg total) by mouth every 12 (twelve) hours. Take on an empty stomach, 1 hour before or 2 hours after meals. 120 capsule 2  . pantoprazole (PROTONIX) 40 MG tablet Take 1 tablet (40 mg total) by mouth daily. 30 tablet 1  . polyethylene glycol (MIRALAX / GLYCOLAX) 17 g packet Take 17 g by mouth daily.    . sitaGLIPtin (JANUVIA) 50 MG tablet Take 50 mg by mouth daily.     Marland Kitchen spironolactone (ALDACTONE) 25 MG tablet Take 0.5 tablets (12.5 mg total) by mouth 2 (two) times daily. (Patient taking differently:  Take 12.5 mg by mouth every evening. ) 90 tablet 3  . SYNTHROID 137 MCG tablet Take 137 mcg by mouth daily before breakfast.    . aspirin EC 81 MG tablet Take 1 tablet (81 mg total) by mouth daily. (Patient not taking: Reported on 05/07/2020)    . clopidogrel (PLAVIX) 75 MG tablet Take 1 tablet (75 mg total) by mouth daily. (Patient not taking: Reported on 05/07/2020) 30 tablet 11  . oxyCODONE-acetaminophen (PERCOCET) 5-325 MG tablet Take 1 tablet by mouth every 6 (six) hours as needed. (Patient not taking: Reported on 05/07/2020) 20 tablet 0   No current facility-administered medications for this visit.    REVIEW OF SYSTEMS:  [X]  denotes positive finding, [ ]  denotes negative finding Cardiac  Comments:  Chest pain or chest pressure:  Shortness of breath upon exertion:    Short of breath when lying flat:    Irregular heart rhythm:        Vascular    Pain in calf, thigh, or hip brought on by ambulation:    Pain in feet at night that wakes you up from your sleep:     Blood clot in your veins:    Leg swelling:         Pulmonary    Oxygen at home:    Productive cough:     Wheezing:         Neurologic    Sudden weakness in arms or legs:     Sudden numbness in arms or legs:     Sudden onset of difficulty speaking or slurred speech:    Temporary loss of vision in one eye:     Problems with dizziness:         Gastrointestinal    Blood in stool:     Vomited blood:         Genitourinary    Burning when urinating:     Blood in urine:        Psychiatric    Major depression:         Hematologic    Bleeding problems:    Problems with blood clotting too easily:        Skin    Rashes or ulcers:        Constitutional    Fever or chills:      PHYSICAL EXAM: Vitals:   05/07/20 1029  BP: (!) 144/64  Pulse: 81  Resp: 14  Temp: (!) 97.2 F (36.2 C)  TempSrc: Temporal  SpO2: 100%  Weight: 177 lb (80.3 kg)  Height: 5\' 8"  (1.727 m)    GENERAL: The patient is a well-nourished  female, in no acute distress. The vital signs are documented above. CARDIAC: There is a regular rate and rhythm.  VASCULAR:  Palpable femoral pulses bilaterally Right DP palpable No left pedal pulses palpable Left 4th toe wound as pictured below           DATA:   None  Assessment/Plan:  79 year old female status post right external iliac angioplasty and stent on 12/06/2019 followed by right common femoral to below-knee pop bypass with ipsilateral nonreversed saphenous vein on 12/15/2019 for critical limb ischemia with rest pain and tissue loss.  On surveillance there does appear to be some elevated velocities in the distal external iliac stent on the right.  That being said she has a good femoral pulse and a good dorsalis pedis pulse in the right foot.  My new concern is that she now has a left fourth toe ulceration.  ABIs in the left are 0.30 monophasic at the ankle with a toe pressure 28.  Discussed that I do not think she has enough flow to heal her ulceration on the left foot as pictured above.  I think she needs an updated arteriogram to better define distal targets.  Her last arteriogram was done with CO2 and much more focused in the right leg.  Did appear to have flush occlusion on the left.  This focus will be on the left leg and can also look at her right iliac stent the same time given elevated velocities on duplex.  We will schedule for next week.  [name], MD [facility] [place] Office: [phone]

## 2020-05-07 NOTE — Patient Instructions (Signed)

## 2020-05-15 ENCOUNTER — Other Ambulatory Visit: Payer: Self-pay

## 2020-05-15 ENCOUNTER — Ambulatory Visit (HOSPITAL_COMMUNITY)
Admission: RE | Admit: 2020-05-15 | Discharge: 2020-05-15 | Disposition: A | Discharge status: Home / Self Care | Payer: PPO | Attending: Vascular Surgery | Admitting: Vascular Surgery

## 2020-05-15 ENCOUNTER — Ambulatory Visit (HOSPITAL_COMMUNITY)
Admission: RE | Disposition: A | Discharge status: Home / Self Care | Payer: Self-pay | Source: Home / Self Care | Attending: Vascular Surgery

## 2020-05-15 DIAGNOSIS — I482 Chronic atrial fibrillation, unspecified: Secondary | ICD-10-CM | POA: Insufficient documentation

## 2020-05-15 DIAGNOSIS — I70245 Atherosclerosis of native arteries of left leg with ulceration of other part of foot: Secondary | ICD-10-CM | POA: Diagnosis not present

## 2020-05-15 DIAGNOSIS — L97529 Non-pressure chronic ulcer of other part of left foot with unspecified severity: Secondary | ICD-10-CM | POA: Insufficient documentation

## 2020-05-15 DIAGNOSIS — Z7984 Long term (current) use of oral hypoglycemic drugs: Secondary | ICD-10-CM | POA: Diagnosis not present

## 2020-05-15 DIAGNOSIS — Z7982 Long term (current) use of aspirin: Secondary | ICD-10-CM | POA: Diagnosis not present

## 2020-05-15 DIAGNOSIS — Z7902 Long term (current) use of antithrombotics/antiplatelets: Secondary | ICD-10-CM | POA: Insufficient documentation

## 2020-05-15 DIAGNOSIS — E1151 Type 2 diabetes mellitus with diabetic peripheral angiopathy without gangrene: Secondary | ICD-10-CM | POA: Insufficient documentation

## 2020-05-15 DIAGNOSIS — Z79890 Hormone replacement therapy (postmenopausal): Secondary | ICD-10-CM | POA: Diagnosis not present

## 2020-05-15 DIAGNOSIS — E11621 Type 2 diabetes mellitus with foot ulcer: Secondary | ICD-10-CM | POA: Insufficient documentation

## 2020-05-15 DIAGNOSIS — N183 Chronic kidney disease, stage 3 unspecified: Secondary | ICD-10-CM | POA: Diagnosis not present

## 2020-05-15 DIAGNOSIS — E1122 Type 2 diabetes mellitus with diabetic chronic kidney disease: Secondary | ICD-10-CM | POA: Insufficient documentation

## 2020-05-15 DIAGNOSIS — I130 Hypertensive heart and chronic kidney disease with heart failure and stage 1 through stage 4 chronic kidney disease, or unspecified chronic kidney disease: Secondary | ICD-10-CM | POA: Insufficient documentation

## 2020-05-15 DIAGNOSIS — I5022 Chronic systolic (congestive) heart failure: Secondary | ICD-10-CM | POA: Insufficient documentation

## 2020-05-15 DIAGNOSIS — Z79899 Other long term (current) drug therapy: Secondary | ICD-10-CM | POA: Diagnosis not present

## 2020-05-15 DIAGNOSIS — Z888 Allergy status to other drugs, medicaments and biological substances status: Secondary | ICD-10-CM | POA: Diagnosis not present

## 2020-05-15 DIAGNOSIS — I428 Other cardiomyopathies: Secondary | ICD-10-CM | POA: Insufficient documentation

## 2020-05-15 DIAGNOSIS — E039 Hypothyroidism, unspecified: Secondary | ICD-10-CM | POA: Insufficient documentation

## 2020-05-15 DIAGNOSIS — Z87891 Personal history of nicotine dependence: Secondary | ICD-10-CM | POA: Insufficient documentation

## 2020-05-15 DIAGNOSIS — I998 Other disorder of circulatory system: Secondary | ICD-10-CM | POA: Diagnosis not present

## 2020-05-15 HISTORY — PX: ABDOMINAL AORTOGRAM W/LOWER EXTREMITY: CATH118223

## 2020-05-15 LAB — POCT I-STAT, CHEM 8
BUN: 39 mg/dL — ABNORMAL HIGH (ref 8–23)
Calcium, Ion: 1.17 mmol/L (ref 1.15–1.40)
Chloride: 99 mmol/L (ref 98–111)
Creatinine, Ser: 2 mg/dL — ABNORMAL HIGH (ref 0.44–1.00)
Glucose, Bld: 275 mg/dL — ABNORMAL HIGH (ref 70–99)
HCT: 43 % (ref 36.0–46.0)
Hemoglobin: 14.6 g/dL (ref 12.0–15.0)
Potassium: 4 mmol/L (ref 3.5–5.1)
Sodium: 138 mmol/L (ref 135–145)
TCO2: 27 mmol/L (ref 22–32)

## 2020-05-15 SURGERY — ABDOMINAL AORTOGRAM W/LOWER EXTREMITY
Anesthesia: LOCAL | Laterality: Bilateral

## 2020-05-15 MED ORDER — LABETALOL HCL 5 MG/ML IV SOLN: 10.0000 mg | INTRAVENOUS | Status: DC | PRN

## 2020-05-15 MED ORDER — SODIUM CHLORIDE 0.9 % IV SOLN: 250.0000 mL | INTRAVENOUS | Status: DC | PRN

## 2020-05-15 MED ORDER — SODIUM CHLORIDE 0.9% FLUSH: 3.0000 mL | INTRAVENOUS | Status: DC | PRN

## 2020-05-15 MED ORDER — HEPARIN (PORCINE) IN NACL 1000-0.9 UT/500ML-% IV SOLN
INTRAVENOUS | Status: DC | PRN
  Administered 2020-05-15 (×2): 500 mL

## 2020-05-15 MED ORDER — FENTANYL CITRATE (PF) 100 MCG/2ML IJ SOLN
INTRAMUSCULAR | Status: DC | PRN
  Administered 2020-05-15: 25 ug via INTRAVENOUS

## 2020-05-15 MED ORDER — SODIUM CHLORIDE 0.9% FLUSH: 3.0000 mL | Freq: Two times a day (BID) | INTRAVENOUS | Status: DC

## 2020-05-15 MED ORDER — ACETAMINOPHEN 325 MG PO TABS: 650.0000 mg | ORAL_TABLET | ORAL | Status: DC | PRN

## 2020-05-15 MED ORDER — IODIXANOL 320 MG/ML IV SOLN
INTRAVENOUS | Status: DC | PRN
  Administered 2020-05-15: 26 mL

## 2020-05-15 MED ORDER — LIDOCAINE HCL (PF) 1 % IJ SOLN
INTRAMUSCULAR | Status: AC
  Filled 2020-05-15: qty 30

## 2020-05-15 MED ORDER — HEPARIN (PORCINE) IN NACL 1000-0.9 UT/500ML-% IV SOLN
INTRAVENOUS | Status: AC
  Filled 2020-05-15: qty 1000

## 2020-05-15 MED ORDER — HYDRALAZINE HCL 20 MG/ML IJ SOLN: 5.0000 mg | INTRAMUSCULAR | Status: DC | PRN

## 2020-05-15 MED ORDER — SODIUM CHLORIDE 0.9 % IV SOLN: INTRAVENOUS | Status: DC

## 2020-05-15 MED ORDER — ONDANSETRON HCL 4 MG/2ML IJ SOLN: 4.0000 mg | Freq: Four times a day (QID) | INTRAMUSCULAR | Status: DC | PRN

## 2020-05-15 MED ORDER — MIDAZOLAM HCL 2 MG/2ML IJ SOLN
INTRAMUSCULAR | Status: AC
  Filled 2020-05-15: qty 2

## 2020-05-15 MED ORDER — MIDAZOLAM HCL 2 MG/2ML IJ SOLN
INTRAMUSCULAR | Status: DC | PRN
  Administered 2020-05-15: 1 mg via INTRAVENOUS

## 2020-05-15 MED ORDER — FENTANYL CITRATE (PF) 100 MCG/2ML IJ SOLN
INTRAMUSCULAR | Status: AC
  Filled 2020-05-15: qty 2

## 2020-05-15 SURGICAL SUPPLY — 12 items
CATH OMNI FLUSH 5F 65CM (CATHETERS) ×2 IMPLANT
FILTER CO2 0.2 MICRON (VASCULAR PRODUCTS) ×2 IMPLANT
KIT MICROPUNCTURE NIT STIFF (SHEATH) ×2 IMPLANT
KIT PV (KITS) ×2 IMPLANT
RESERVOIR CO2 (VASCULAR PRODUCTS) ×2 IMPLANT
SET FLUSH CO2 (MISCELLANEOUS) ×2 IMPLANT
SHEATH PINNACLE 5F 10CM (SHEATH) ×2 IMPLANT
SHEATH PROBE COVER 6X72 (BAG) ×2 IMPLANT
SYR MEDRAD MARK V 150ML (SYRINGE) ×2 IMPLANT
TRANSDUCER W/STOPCOCK (MISCELLANEOUS) ×2 IMPLANT
TRAY PV CATH (CUSTOM PROCEDURE TRAY) ×2 IMPLANT
WIRE J 3MM .035X145CM (WIRE) ×2 IMPLANT

## 2020-05-15 NOTE — Discharge Instructions (Signed)
Femoral Site Care This sheet gives you information about how to care for yourself after your procedure. Your health care provider may also give you more specific instructions. If you have problems or questions, contact your health care provider. What can I expect after the procedure? After the procedure, it is common to have:  Bruising that usually fades within 1-2 weeks.  Tenderness at the site. Follow these instructions at home: Wound care  Follow instructions from your health care provider about how to take care of your insertion site. Make sure you: ? Wash your hands with soap and water before you change your bandage (dressing). If soap and water are not available, use hand sanitizer. ? Change your dressing as told by your health care provider. ? Leave stitches (sutures), skin glue, or adhesive strips in place. These skin closures may need to stay in place for 2 weeks or longer. If adhesive strip edges start to loosen and curl up, you may trim the loose edges. Do not remove adhesive strips completely unless your health care provider tells you to do that.  Do not take baths, swim, or use a hot tub until your health care provider approves.  You may shower 24-48 hours after the procedure or as told by your health care provider. ? Gently wash the site with plain soap and water. ? Pat the area dry with a clean towel. ? Do not rub the site. This may cause bleeding.  Do not apply powder or lotion to the site. Keep the site clean and dry.  Check your femoral site every day for signs of infection. Check for: ? Redness, swelling, or pain. ? Fluid or blood. ? Warmth. ? Pus or a bad smell. Activity  For the first 2-3 days after your procedure, or as long as directed: ? Avoid climbing stairs as much as possible. ? Do not squat.  Do not lift anything that is heavier than 10 lb (4.5 kg), or the limit that you are told, until your health care provider says that it is safe.  Rest as  directed. ? Avoid sitting for a long time without moving. Get up to take short walks every 1-2 hours.  Do not drive for 24 hours if you were given a medicine to help you relax (sedative). General instructions  Take over-the-counter and prescription medicines only as told by your health care provider.  Keep all follow-up visits as told by your health care provider. This is important. Contact a health care provider if you have:  A fever or chills.  You have redness, swelling, or pain around your insertion site. Get help right away if:  The catheter insertion area swells very fast.  You pass out.  You suddenly start to sweat or your skin gets clammy.  The catheter insertion area is bleeding, and the bleeding does not stop when you hold steady pressure on the area.  The area near or just beyond the catheter insertion site becomes pale, cool, tingly, or numb. These symptoms may represent a serious problem that is an emergency. Do not wait to see if the symptoms will go away. Get medical help right away. Call your local emergency services (911 in the [place] Do not drive yourself to the hospital. Summary  After the procedure, it is common to have bruising that usually fades within 1-2 weeks.  Check your femoral site every day for signs of infection.  Do not lift anything that is heavier than 10 lb (4.5 kg), or the   limit that you are told, until your health care provider says that it is safe. This information is not intended to replace advice given to you by your health care provider. Make sure you discuss any questions you have with your health care provider. Document Revised: 11/29/2017 Document Reviewed: 11/29/2017 Elsevier Patient Education  2020 [employer]  

## 2020-05-15 NOTE — Op Note (Signed)
    Patient name: Sara Warner MRN: [id number] DOB: [birth year] Sex: female  05/15/2020 Pre-operative Diagnosis: Critical limb ischemia of the left lower extremity with tissue loss Post-operative diagnosis:  Same Surgeon:  [name], MD Procedure Performed: 1.  Ultrasound-guided access of the left common femoral artery 2.  CO2 aortogram including catheter selection of aorta and evaluation of right iliac stents 3.  Left lower extremity arteriogram with limited contrast 4.  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  Ultimately she recently presented for surveillance and was found to have new tissue loss in her left lower extremity with ABI of 0.3.  She has a known flush SFA occlusion on the left.  She presents for left lower extremity arteriogram for bypass target and also to evaluate her right iliac stent that was noted to have elevated velocity on surveillance.  Findings:   Aortogram showed patent infrarenal aorta.  On the right the external iliac stents are patent with no visualized flow-limiting stenosis.  The proximal portion of the bypass was visualized in the right groin and its widely patent as well.  On the left she has a high-grade left common femoral artery stenosis.  There is a flush left SFA occlusion with only profunda runoff.  She does reconstitute an above-knee popliteal artery looks like a decent target with three-vessel runoff.  Dominant runoff in the left lower extremity is via anterior tibial and posterior tibial into the ankle.   Procedure:  The patient was identified in the holding area and taken to room 8.  The patient was then placed supine on the table and prepped and draped in the usual sterile fashion.  A time out was called.  Ultrasound was used to  evaluate the left common femoral artery.  It was patent .  A digital ultrasound image was acquired.  A micropuncture needle was used to access the left common femoral artery under ultrasound guidance.  An 018 wire was advanced without resistance and a micropuncture sheath was placed.  The 018 wire was removed and a J wire was placed.  The micropuncture sheath was exchanged for a 5 french sheath.  An omniflush catheter was advanced over the wire to the level of L-1.  An abdominal angiogram was obtained with CO2 to evaluate to the right iliac stents as well as the proximal portion of the bypass.  We then removed the catheter and wires and using the sheath in the left common femoral artery attempted to use CO2 but had very limited images.  We then used a little bit of contrast to get better runoff images in the left lower extremity to evaluate for distal target.  She appears to have at least a above-knee and below-knee targets with patent trifurcation.  No further imaging was necessary.  At that point time she will be taken to holding for having her left sheath removed with manual pressure given left common femoral disease.  Plan: Patient will be scheduled for left common femoral endarterectomy with a left femoral to popliteal bypass.   [name], MD [facility] Office: [phone]

## 2020-05-15 NOTE — Progress Notes (Signed)
Discharge instructions reviewed with pt and her daughter (via telephone) both voice understanding.  

## 2020-05-15 NOTE — H&P (Signed)
History and Physical Interval Note:  05/15/2020 10:33 AM  Sara Warner  has presented today for surgery, with the diagnosis of pad.  The various methods of treatment have been discussed with the patient and family. After consideration of risks, benefits and other options for treatment, the patient has consented to  Procedure(s): ABDOMINAL AORTOGRAM W/LOWER EXTREMITY (N/A) as a surgical intervention.  The patient's history has been reviewed, patient examined, no change in status, stable for surgery.  I have reviewed the patient's chart and labs.  Questions were answered to the patient's satisfaction.     [name]  Patient name: Sara Warner          MRN: [id number]        DOB: [birth year]        Sex: female  REASON FOR VISIT: 3 month follow-up for surveillance  HPI: Sara Warner is a 79 y.o. female that presents for ongoing follow-up after right common femoral to below knee pop bypass on 12/15/2019 with great saphenous vein for critical limb ischemia with tissue loss.  She also underwent a right external iliac angioplasty with stent using several Lutonix stents on 12/06/2019 prior to her bypass.  Prior to surgery she had an ABI 0.2 with a right great toe ulcer.  She did require washout of the below-knee popliteal incision on 01/10/2020 with VAC placement after presenting to the ED.  On surveillance today reports that her right leg is doing well.  She has no new wounds or other problems with the right foot.  The previous ulcer is healed.  She does note that her left fourth toe has an ulceration now has been present for about a month and nonhealing.      Past Medical History:  Diagnosis Date  . Atrial fibrillation ([place])   . Chronic atrial fibrillation ([place])    a. Refuses [place].  CHA2DS2VASc = 4.  . Chronic systolic CHF (congestive heart failure) ([place])    a. 05/2015 Echo: EF 35-40%.  . CKD (chronic kidney disease), stage III   . CML (chronic myelocytic leukemia) ([place])  01/10/2013  . DM (diabetes mellitus) ([place])   . Dysrhythmia   . Elevated WBC count   . Epistaxis 04/03/2020   AFTER COVID SWAB  . Hypertension   . Hypertensive heart disease   . Hypothyroidism   . NICM (nonischemic cardiomyopathy) ([place])    a. 06/2013 MV: low risk study w/o ischemia;  b. 05/2015 Echo: EF 35-40%, diff HK, basal-midinferoseptal and basal-midanteroseptal AK. Mild-mod MR, mod dil LA, mild to mod TR, PASP 55mmHg.  Marland Kitchen PAD (peripheral artery disease) ([place])   . Pneumonia    30 yrs. ago  . Toxic goiter          Past Surgical History:  Procedure Laterality Date  . ABDOMINAL AORTOGRAM W/LOWER EXTREMITY Bilateral 12/06/2019   Procedure: ABDOMINAL AORTOGRAM W/LOWER EXTREMITY;  Surgeon: [name], MD;  Location: [facility];  Service: Cardiovascular;  Laterality: Bilateral;  . ABDOMINAL HYSTERECTOMY    . APPLICATION OF WOUND VAC  01/10/2020   BLEEDING FROM FEMORAL BYPASS  . BIOPSY  04/08/2020   Procedure: BIOPSY;  Surgeon: Jackquline Denmark, MD;  Location: [facility] ENDOSCOPY;  Service: Endoscopy;;  . CARDIOVERSION    . COLONOSCOPY WITH PROPOFOL N/A 04/08/2020   Procedure: COLONOSCOPY WITH PROPOFOL;  Surgeon: Jackquline Denmark, MD;  Location: [facility] ENDOSCOPY;  Service: Endoscopy;  Laterality: N/A;  . CYST EXCISION     removed on right ovary  . CYSTECTOMY    .  ESOPHAGOGASTRODUODENOSCOPY (EGD) WITH PROPOFOL N/A 04/08/2020   Procedure: ESOPHAGOGASTRODUODENOSCOPY (EGD) WITH PROPOFOL;  Surgeon: Jackquline Denmark, MD;  Location: [facility] ENDOSCOPY;  Service: Endoscopy;  Laterality: N/A;  . FEMORAL ARTERY - POPLITEAL ARTERY BYPASS GRAFT  12/15/2019   Right common femoral artery to below-knee popliteal artery bypass with ipsilateral nonreversed great saphenous vein  . FEMORAL-POPLITEAL BYPASS GRAFT Right 12/15/2019   Procedure: BYPASS GRAFT FEMORAL-POPLITEAL ARTERY;  Surgeon: [name], MD;  Location: [facility];  Service: Vascular;  Laterality: Right;  .  HYSTEROTOMY    . PERIPHERAL VASCULAR INTERVENTION Right 12/06/2019   Procedure: PERIPHERAL VASCULAR INTERVENTION;  Surgeon: [name], MD;  Location: [facility];  Service: Cardiovascular;  Laterality: Right;  EXT ILIAC  . POLYPECTOMY  04/08/2020   Procedure: POLYPECTOMY;  Surgeon: Jackquline Denmark, MD;  Location: [facility] ENDOSCOPY;  Service: Endoscopy;;  . WOUND EXPLORATION Right 01/10/2020   Procedure: right lower leg Wound Exploration with wound vac application;  Surgeon: Serafina Mitchell, MD;  Location: [facility] OR;  Service: Vascular;  Laterality: Right;         Family History  Problem Relation Age of Onset  . Cancer - Colon Mother        mast to lungs  . Diabetes Father   . Diabetes Paternal Grandmother     SOCIAL HISTORY: Social History        Tobacco Use  . Smoking status: Former Smoker    Types: Cigarettes    Quit date: 01/01/1984    Years since quitting: 36.3  . Smokeless tobacco: Never Used  Substance Use Topics  . Alcohol use: No         Allergies  Allergen Reactions  . Lisinopril Cough  . Losartan Other (See Comments)    Patient reports nightmares/vivid dreams when taking.          Current Outpatient Medications  Medication Sig Dispense Refill  . atorvastatin (LIPITOR) 20 MG tablet TAKE 1 TABLET (20 MG TOTAL) BY MOUTH DAILY AT 6 PM. 90 tablet 1  . bisacodyl (DULCOLAX) 10 MG suppository Place 10 mg rectally as needed for moderate constipation.    . clorazepate (TRANXENE-T) 7.5 MG tablet Take 1 tablet (7.5 mg total) by mouth 2 (two) times daily as needed for anxiety. (Patient taking differently: Take 7.5 mg by mouth daily as needed for anxiety. ) 30 tablet 1  . digoxin (LANOXIN) 0.125 MG tablet Take 0.5 tablets (0.0625 mg total) by mouth every other day. 45 tablet 1  . furosemide (LASIX) 20 MG tablet TAKE 2 TABLETS BY MOUTH IN THE MORNING AND AN EXTRA TABLET IF GAIN 3 OR MORE POUNDS IN 1 DAY 270 tablet 1  . glipiZIDE (GLUCOTROL XL)  10 MG 24 hr tablet Take 10 mg by mouth daily.     . metoprolol (TOPROL-XL) 200 MG 24 hr tablet TAKE 1 TABLET BY MOUTH DAILY. (Patient taking differently: Take 200 mg by mouth daily. ) 90 tablet 2  . nilotinib (TASIGNA) 150 MG capsule Take 2 capsules (300 mg total) by mouth every 12 (twelve) hours. Take on an empty stomach, 1 hour before or 2 hours after meals. 120 capsule 2  . pantoprazole (PROTONIX) 40 MG tablet Take 1 tablet (40 mg total) by mouth daily. 30 tablet 1  . polyethylene glycol (MIRALAX / GLYCOLAX) 17 g packet Take 17 g by mouth daily.    . sitaGLIPtin (JANUVIA) 50 MG tablet Take 50 mg by mouth daily.     Marland Kitchen spironolactone (ALDACTONE) 25  MG tablet Take 0.5 tablets (12.5 mg total) by mouth 2 (two) times daily. (Patient taking differently: Take 12.5 mg by mouth every evening. ) 90 tablet 3  . SYNTHROID 137 MCG tablet Take 137 mcg by mouth daily before breakfast.    . aspirin EC 81 MG tablet Take 1 tablet (81 mg total) by mouth daily. (Patient not taking: Reported on 05/07/2020)    . clopidogrel (PLAVIX) 75 MG tablet Take 1 tablet (75 mg total) by mouth daily. (Patient not taking: Reported on 05/07/2020) 30 tablet 11  . oxyCODONE-acetaminophen (PERCOCET) 5-325 MG tablet Take 1 tablet by mouth every 6 (six) hours as needed. (Patient not taking: Reported on 05/07/2020) 20 tablet 0   No current facility-administered medications for this visit.    REVIEW OF SYSTEMS:  [X]  denotes positive finding, [ ]  denotes negative finding Cardiac  Comments:  Chest pain or chest pressure:    Shortness of breath upon exertion:    Short of breath when lying flat:    Irregular heart rhythm:        Vascular    Pain in calf, thigh, or hip brought on by ambulation:    Pain in feet at night that wakes you up from your sleep:     Blood clot in your veins:    Leg swelling:         Pulmonary    Oxygen at home:    Productive cough:     Wheezing:           Neurologic    Sudden weakness in arms or legs:     Sudden numbness in arms or legs:     Sudden onset of difficulty speaking or slurred speech:    Temporary loss of vision in one eye:     Problems with dizziness:         Gastrointestinal    Blood in stool:     Vomited blood:         Genitourinary    Burning when urinating:     Blood in urine:        Psychiatric    Major depression:         Hematologic    Bleeding problems:    Problems with blood clotting too easily:        Skin    Rashes or ulcers:        Constitutional    Fever or chills:      PHYSICAL EXAM:    Vitals:   05/07/20 1029  BP: (!) 144/64  Pulse: 81  Resp: 14  Temp: (!) 97.2 F (36.2 C)  TempSrc: Temporal  SpO2: 100%  Weight: 177 lb (80.3 kg)  Height: 5\' 8"  (1.727 m)    GENERAL: The patient is a well-nourished female, in no acute distress. The vital signs are documented above. CARDIAC: There is a regular rate and rhythm.  VASCULAR:  Palpable femoral pulses bilaterally Right DP palpable No left pedal pulses palpable Left 4th toe wound as pictured below           DATA:   None  Assessment/Plan:  79 year old female status post right external iliac angioplasty and stent on 12/06/2019 followed by right common femoral to below-knee pop bypass with ipsilateral nonreversed saphenous vein on 12/15/2019 for critical limb ischemia with rest pain and tissue loss.  On surveillance there does appear to be some elevated velocities in the distal external iliac stent on the right.  That being said she has a  good femoral pulse and a good dorsalis pedis pulse in the right foot.  My new concern is that she now has a left fourth toe ulceration.  ABIs in the left are 0.30 monophasic at the ankle with a toe pressure 28.  Discussed that I do not think she has enough flow to heal her ulceration on the left foot as pictured above.  I  think she needs an updated arteriogram to better define distal targets.  Her last arteriogram was done with CO2 and much more focused in the right leg.  Did appear to have flush occlusion on the left.  This focus will be on the left leg and can also look at her right iliac stent the same time given elevated velocities on duplex.  We will schedule for next week.  [name], MD [facility] Office: [phone]

## 2020-05-15 NOTE — Progress Notes (Signed)
Up and walked and tolerated well; left groin stable, no bleeding or hematoma 

## 2020-05-16 ENCOUNTER — Telehealth: Payer: Self-pay

## 2020-05-16 ENCOUNTER — Encounter (HOSPITAL_COMMUNITY): Payer: Self-pay | Admitting: Vascular Surgery

## 2020-05-16 MED FILL — Lidocaine HCl Local Preservative Free (PF) Inj 1%: INTRAMUSCULAR | Qty: 30 | Status: AC

## 2020-05-16 NOTE — Telephone Encounter (Signed)
Pt called to cancel surgery because she said that she refuses to have the Covid test done. Dr Carlis Abbott paged to notify him of change and pt removed from OR schedule.   York Cerise, CMA

## 2020-05-20 ENCOUNTER — Encounter (HOSPITAL_COMMUNITY): Admission: RE | Payer: Self-pay | Source: Home / Self Care

## 2020-05-20 ENCOUNTER — Inpatient Hospital Stay (HOSPITAL_COMMUNITY): Admission: RE | Admit: 2020-05-20 | Payer: PPO | Source: Home / Self Care | Admitting: Vascular Surgery

## 2020-05-20 SURGERY — ENDARTERECTOMY, FEMORAL
Anesthesia: General | Laterality: Left

## 2020-05-22 ENCOUNTER — Ambulatory Visit: Payer: PPO | Admitting: Sports Medicine

## 2020-05-27 ENCOUNTER — Telehealth: Payer: Self-pay | Admitting: Cardiovascular Disease

## 2020-05-27 NOTE — Telephone Encounter (Signed)
New message   Please call Sherese with [facility] to discuss the patient's medication issues. Please call.

## 2020-05-27 NOTE — Telephone Encounter (Signed)
Spoke with Sherese with [facility] regarding the patients medications. She states that the patient had surgery on her leg a few months ago and has been off her Plavix for at least two months since the original operation. She has had multiple hospital stays post-op d/t anemia. Sherese states that she believes the patient is in NSR and has no complaints at this time but wants to make sure Dr. Claiborne Billings is aware that the patient is not taking Asprin or Plavix at this time.

## 2020-05-28 NOTE — Telephone Encounter (Signed)
ok 

## 2020-05-29 ENCOUNTER — Ambulatory Visit: Payer: PPO | Admitting: Sports Medicine

## 2020-06-05 ENCOUNTER — Other Ambulatory Visit: Payer: Self-pay

## 2020-06-12 ENCOUNTER — Encounter (HOSPITAL_COMMUNITY)
Admission: RE | Admit: 2020-06-12 | Discharge: 2020-06-12 | Disposition: A | Discharge status: Home / Self Care | Payer: PPO | Source: Ambulatory Visit | Attending: Vascular Surgery | Admitting: Vascular Surgery

## 2020-06-12 ENCOUNTER — Encounter (HOSPITAL_COMMUNITY): Payer: Self-pay

## 2020-06-12 ENCOUNTER — Other Ambulatory Visit: Payer: Self-pay

## 2020-06-12 DIAGNOSIS — Z01812 Encounter for preprocedural laboratory examination: Secondary | ICD-10-CM | POA: Insufficient documentation

## 2020-06-12 LAB — COMPREHENSIVE METABOLIC PANEL
ALT: 23 U/L (ref 0–44)
AST: 19 U/L (ref 15–41)
Albumin: 4.5 g/dL (ref 3.5–5.0)
Alkaline Phosphatase: 70 U/L (ref 38–126)
Anion gap: 13 (ref 5–15)
BUN: 52 mg/dL — ABNORMAL HIGH (ref 8–23)
CO2: 27 mmol/L (ref 22–32)
Calcium: 9.6 mg/dL (ref 8.9–10.3)
Chloride: 97 mmol/L — ABNORMAL LOW (ref 98–111)
Creatinine, Ser: 2.24 mg/dL — ABNORMAL HIGH (ref 0.44–1.00)
GFR calc Af Amer: 24 mL/min — ABNORMAL LOW (ref >60)
GFR calc non Af Amer: 20 mL/min — ABNORMAL LOW (ref >60)
Glucose, Bld: 168 mg/dL — ABNORMAL HIGH (ref 70–99)
Potassium: 4.3 mmol/L (ref 3.5–5.1)
Sodium: 137 mmol/L (ref 135–145)
Total Bilirubin: 0.8 mg/dL (ref 0.3–1.2)
Total Protein: 7.7 g/dL (ref 6.5–8.1)

## 2020-06-12 LAB — URINALYSIS, ROUTINE W REFLEX MICROSCOPIC
Bilirubin Urine: NEGATIVE
Glucose, UA: NEGATIVE mg/dL
Hgb urine dipstick: NEGATIVE
Ketones, ur: NEGATIVE mg/dL
Leukocytes,Ua: NEGATIVE
Nitrite: NEGATIVE
Protein, ur: NEGATIVE mg/dL
Specific Gravity, Urine: 1.011 (ref 1.005–1.030)
pH: 5 (ref 5.0–8.0)

## 2020-06-12 LAB — PROTIME-INR
INR: 1.1 (ref 0.8–1.2)
Prothrombin Time: 14.1 seconds (ref 11.4–15.2)

## 2020-06-12 LAB — HEMOGLOBIN A1C
Hgb A1c MFr Bld: 7.2 % — ABNORMAL HIGH (ref 4.8–5.6)
Mean Plasma Glucose: 159.94 mg/dL

## 2020-06-12 LAB — APTT: aPTT: 34 seconds (ref 24–36)

## 2020-06-12 LAB — CBC
HCT: 48.5 % — ABNORMAL HIGH (ref 36.0–46.0)
Hemoglobin: 15 g/dL (ref 12.0–15.0)
MCH: 26.4 pg (ref 26.0–34.0)
MCHC: 30.9 g/dL (ref 30.0–36.0)
MCV: 85.2 fL (ref 80.0–100.0)
Platelets: 468 10*3/uL — ABNORMAL HIGH (ref 150–400)
RBC: 5.69 MIL/uL — ABNORMAL HIGH (ref 3.87–5.11)
RDW: 25.2 % — ABNORMAL HIGH (ref 11.5–15.5)
WBC: 15.8 10*3/uL — ABNORMAL HIGH (ref 4.0–10.5)
nRBC: 0 % (ref 0.0–0.2)

## 2020-06-12 LAB — SURGICAL PCR SCREEN
MRSA, PCR: NEGATIVE
Staphylococcus aureus: NEGATIVE

## 2020-06-12 LAB — GLUCOSE, CAPILLARY: Glucose-Capillary: 202 mg/dL — ABNORMAL HIGH (ref 70–99)

## 2020-06-12 NOTE — Progress Notes (Signed)
[id number] - [place], [address] [place] [address] Phone: [phone] Fax: [place], [place] [address] [place] [address] Phone: [phone] Fax: [phone]      Your procedure is scheduled on Thursday, July 22nd.  Report to [facility] Main Entrance "A" at 10:00 A.M., and check in at the Admitting office.  Call this number if you have problems the morning of surgery:  [phone]  Call [phone] if you have any questions prior to your surgery date Monday-Friday 8am-4pm    Remember:  Do not eat or drink after midnight the night before your surgery     Take these medicines the morning of surgery with A SIP OF WATER   Atorvastatin (Lipitor)  Zyrtec - if needed  Clorazepate - if needed  Digoxin (lanoxin)  Metoprolol  Nilotinib ([place])  Oxycodone-Acetaminophen  Pantroprazole (Protonix)  Synthroid  As of today, STOP taking any Aspirin (unless otherwise instructed by your surgeon) Aleve, Naproxen, Ibuprofen, Motrin, Advil, Goody's, BC's, all herbal medications, fish oil, and all vitamins.   WHAT DO I DO ABOUT MY DIABETES MEDICATION?   Marland Kitchen Do not take oral diabetes medicines (pills) the morning of surgery. - Glipizide, Januvia   HOW TO MANAGE YOUR DIABETES BEFORE AND AFTER SURGERY  Why is it important to control my blood sugar before and after surgery? . Improving blood sugar levels before and after surgery helps healing and can limit problems. . A way of improving blood sugar control is eating a healthy diet by: o  Eating less sugar and carbohydrates o  Increasing activity/exercise o  Talking with your doctor about reaching your blood sugar goals . High blood sugars (greater than 180 mg/dL) can raise your risk of infections and slow your recovery, so you will need to focus on controlling your diabetes during  the weeks before surgery. . Make sure that the doctor who takes care of your diabetes knows about your planned surgery including the date and location.  How do I manage my blood sugar before surgery? . Check your blood sugar at least 4 times a day, starting 2 days before surgery, to make sure that the level is not too high or low. . Check your blood sugar the morning of your surgery when you wake up and every 2 hours until you get to the Short Stay unit. o If your blood sugar is less than 70 mg/dL, you will need to treat for low blood sugar: - Do not take insulin. - Treat a low blood sugar (less than 70 mg/dL) with  cup of clear juice (cranberry or apple), 4 glucose tablets, OR glucose gel. - Recheck blood sugar in 15 minutes after treatment (to make sure it is greater than 70 mg/dL). If your blood sugar is not greater than 70 mg/dL on recheck, call [phone] for further instructions. . Report your blood sugar to the short stay nurse when you get to Short Stay.  . If you are admitted to the hospital after surgery: o Your blood sugar will be checked by the staff and you will probably be given insulin after surgery (instead of oral diabetes medicines) to make sure you have good blood sugar levels. o The goal for blood sugar control after surgery is 80-180 mg/dL.  Do not wear jewelry, make up, or nail polish            Do not wear lotions, powders, perfumes, or deodorant.            Do not shave 48 hours prior to surgery.             Do not bring valuables to the hospital.            [facility] is not responsible for any belongings or valuables.  Do NOT Smoke (Tobacco/Vaping) or drink Alcohol 24 hours prior to your procedure If you use a CPAP at night, you may bring all equipment for your overnight stay.   Contacts, glasses, dentures or bridgework may not be worn into surgery.      For patients admitted to the hospital, discharge time will be determined by your  treatment team.   Patients discharged the day of surgery will not be allowed to drive home, and someone needs to stay with them for 24 hours.    Special instructions:   Protection- Preparing For Surgery  Before surgery, you can play an important role. Because skin is not sterile, your skin needs to be as free of germs as possible. You can reduce the number of germs on your skin by washing with CHG (chlorahexidine gluconate) Soap before surgery.  CHG is an antiseptic cleaner which kills germs and bonds with the skin to continue killing germs even after washing.    Oral Hygiene is also important to reduce your risk of infection.  Remember - BRUSH YOUR TEETH THE MORNING OF SURGERY WITH YOUR REGULAR TOOTHPASTE  Please do not use if you have an allergy to CHG or antibacterial soaps. If your skin becomes reddened/irritated stop using the CHG.  Do not shave (including legs and underarms) for at least 48 hours prior to first CHG shower. It is OK to shave your face.  Please follow these instructions carefully.   1. Shower the NIGHT BEFORE SURGERY and the MORNING OF SURGERY with CHG Soap.   2. If you chose to wash your hair, wash your hair first as usual with your normal shampoo.  3. After you shampoo, rinse your hair and body thoroughly to remove the shampoo.  4. Use CHG as you would any other liquid soap. You can apply CHG directly to the skin and wash gently with a scrungie or a clean washcloth.   5. Apply the CHG Soap to your body ONLY FROM THE NECK DOWN.  Do not use on open wounds or open sores. Avoid contact with your eyes, ears, mouth and genitals (private parts). Wash Face and genitals (private parts)  with your normal soap.   6. Wash thoroughly, paying special attention to the area where your surgery will be performed.  7. Thoroughly rinse your body with warm water from the neck down.  8. DO NOT shower/wash with your normal soap after using and rinsing off the CHG Soap.  9. Pat  yourself dry with a CLEAN TOWEL.  10. Wear CLEAN PAJAMAS to bed the night before surgery  11. Place CLEAN SHEETS on your bed the night of your first shower and DO NOT SLEEP WITH PETS.   Day of Surgery: Wear Clean/Comfortable clothing the morning of surgery Do not apply any deodorants/lotions.   Remember to brush your teeth WITH YOUR REGULAR TOOTHPASTE.   Please read over the following fact sheets that you were given.

## 2020-06-12 NOTE — Progress Notes (Signed)
PCP - Cyndi Bender Cardiologist - Dr. Claiborne Billings   Chest x-ray - n/a EKG - 04-05-20 ECHO - 2019 Cardiac Cath - 05-15-20  DM - Type 2 Fasting Blood Sugar - 150-200   COVID TEST- Wednesday 06-19-20   Anesthesia review: yes, heart history  Patient denies shortness of breath, fever, cough and chest pain at PAT appointment   All instructions explained to the patient, with a verbal understanding of the material. Patient agrees to go over the instructions while at home for a better understanding. Patient also instructed to self quarantine after being tested for COVID-19. The opportunity to ask questions was provided.

## 2020-06-13 NOTE — Progress Notes (Addendum)
Anesthesia Chart Review:  Case: [id number] Date/Time: 06/20/20 1145   Procedures:      LEFT COMMON FEMORAL ENDARTERECTOMY FEMORAL (Left )     BYPASS GRAFT FEMORAL-POPLITEAL ARTERY (Left )   Anesthesia type: General   Pre-op diagnosis: CRITICAL LIMB ISCHEMIA   Location: [facility] OR ROOM 16 / [place] OR   Surgeons: [name], MD      DISCUSSION: Patient is a 79 year old female scheduled for the above procedure. She was initially scheduled for this procedure on 05/20/20, but she cancelled the procedure because she did not want a COVID test (had epistaxis after COVID swab in May). She is s/p right FPBG on 12/15/19, and exploration of graft with ligation of bleeding venous branch and placement of wound VAC on 01/10/20. As of June, her right foot ulcer had healed but now with non-healing left 4th toe ulceration. Arteriogram showed high grade left CFA stenosis with SFA occlusion.   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  Patient last evaluated by her cardiologist Dr. Claiborne Billings in 08/2019. No new cardiac testing ordered, and one year follow-up advised.  No chest pain, SOB, cough, fever per PAT RN interview. EF 45-50% in 01/2018. On 05/28/20, Dr. Claiborne Billings was notified that patient had stopped taking ASA or Plavix due to by recent admission for symptomatic anemia (s/p 4 Units PRBC, moderate gastroduodenitis on EGD) and post-operative bleeding following right FPBG (01/2020). Plavix had been started after right EIA stent.  Staff message sent to Dr. Carlis Abbott regarding abnormal labs (see LABS) and that  patient is currently off ASA and Plavix.   COVID-19 test is scheduled for 06/19/20. Anesthesia team to evaluate on the day of surgery. (UPDATE 06/14/20 10:32 AM: Message received from Dr. Carlis Abbott. Repeat BMET on the day of surgery requested. Given her critical limb ischemia, he does not think the result would delay her surgery, but wanted to have an accurate baseline for surgery that can be followed post-op. He agrees that WBC and PLT count appear within her baseline. He will plan clarify with her if she is willing to resume ASA and/or Plavix in the future from a vascular standpoint, but regardless would have her Plavix on hold for surgery anyway.)      VS: BP 136/74   Pulse 88   Temp 36.6 C (Oral)   Resp 18   Ht 5\' 8"  (1.727 m)   Wt 81.6 kg Comment: Pt cannot stand on scale  SpO2 97%   BMI 27.37 kg/m    PROVIDERS: Cyndi Bender, PA-C is PCP [facility], [place], [place]) - Shelva Majestic, MD is cardiologist, whom she has been seeing since 07/03/13 (previously saw Dr. Doreatha Lew ~ 2008-2012 and Dr. Stanford Breed in 2013). Last visit 08/16/19. Digoxin dosing changed to improve afib rate control (was in the 90's), but otherwise no chest pain and no new testing ordered. One year follow-up recommended. Zola Button, MD is HEM-ONC. Last evaluation 04/22/20. CML in clinical remission. 3 month follow-up planned for repeat iron studies.    LABS: Preoperative labs noted. Cr 2.24, up from 2.00 on 05/15/20. In May 2021, Cr ~ 1.50-1.84 with range of ~ range of ~ 1.40-2.02 since at least 2018.  WBC 15.8, history of CML, WBC range ~ 13.1-16.6K since 03/2020. PLT count 468 (473-594 since 03/2020).  (all labs ordered are listed, but only abnormal results are displayed)  Labs Reviewed  GLUCOSE, CAPILLARY - Abnormal; Notable for the following components:      Result Value   Glucose-Capillary 202 (*)    All other components within normal limits  CBC - Abnormal; Notable for the following components:   WBC 15.8 (*)     RBC 5.69 (*)    HCT 48.5 (*)    RDW 25.2 (*)    Platelets 468 (*)    All other components within normal limits  COMPREHENSIVE METABOLIC PANEL - Abnormal; Notable for the following components:   Chloride 97 (*)    Glucose, Bld 168 (*)    BUN 52 (*)    Creatinine, Ser 2.24 (*)    GFR calc non Af Amer 20 (*)    GFR calc Af Amer 24 (*)    All other components within normal limits  HEMOGLOBIN A1C - Abnormal; Notable for the following components:   Hgb A1c MFr Bld 7.2 (*)    All other components within normal limits  SURGICAL PCR SCREEN  APTT  PROTIME-INR  URINALYSIS, ROUTINE W REFLEX MICROSCOPIC    OTHER:  EGD 04/08/20: IMPRESSION: - Smll hiatal hernia. - Moderate gastroduodenitis. No active UGI bleeding. (Pathology: Chronic active gastritis, no evidence of H. pylori, dysplasia or malignancy)  Colonoscopy 04/08/20: IMPRESSION: - Two 4 to 6 mm polyps in the mid sigmoid colon and in the proximal ascending colon, removed with a cold snare. Resected and retrieved. - Mild sigmoid diverticulosis - Non-bleeding external and internal hemorrhoids. - Otherwise normal colonoscopy. No active bleeding. (Pathology: Duodenal biopsy showed benign small bowel type mucosa without evidence of significant villous atrophy, dysplasia or malignancy.  Ascending colon polypectomy showed tubular adenoma without high-grade dysplasia.)   IMAGES: [place] Abd 04/04/20: IMPRESSION: Probable borderline hepatomegaly. No other abnormality seen in the right upper quadrant of the abdomen.   EKG: EKG 04/03/20: Atrial fibrillation at 74 bpm Cannot rule out Anterior infarct , age undetermined Abnormal ECG No STEMI Confirmed by Octaviano Glow [id number]) on 04/03/2020 2:22:41 PM  08/16/19 ([facility]):  Atrial fibrillation with premature ventricular or aberrantly conducted complexes Voltage criteria for left ventricular hypertrophy Abnormal ECG Ventricular rate 93 bpm   CV: Echo 02/04/18: Study Conclusions -  Left ventricle: The cavity size was moderately dilated. Wall thickness was normal. Systolic function was mildly reduced. The estimated ejection fraction was in the range of 45% to 50%. There is severe hypokinesis of the septal myocardium. - Mitral valve: There was mild regurgitation. - Left atrium: The atrium was moderately dilated. - Right atrium: The atrium was moderately dilated. - Pulmonary arteries: PA peak pressure: 35 mm Hg (S). Impressions: - Severe septal hyokinesis with overall mild LV dysfunction; moderate LVE; mild MR; moderate biatrial enlargement; mild TR. (Comparison: 05/30/15 LVEF 35-40% with diffuse hypokinesis, akinesis of the basal-midinferoseptal myocardium and basal midanteroseptal myocardium)   Nuclear stress test 07/05/13: Overall Impression: Low risk stress nuclear study with a small amount of septal, basal anteroseptal artifact. No significant reversible ischemia. LV Wall Motion: Non-gated study.   Past Medical History:  Diagnosis Date  . Atrial fibrillation ([place])   . Chronic atrial fibrillation ([place])    a. Refuses [place].  CHA2DS2VASc = 4.  . Chronic systolic CHF (congestive heart failure) ([place])    a. 05/2015 Echo: EF 35-40%.  . CKD (chronic kidney disease), stage III   .  CML (chronic myelocytic leukemia) ([place]) 01/10/2013  . DM (diabetes mellitus) ([place])   . Dysrhythmia   . Elevated WBC count   . Epistaxis 04/03/2020   AFTER COVID SWAB  . Hypertension   . Hypertensive heart disease   . Hypothyroidism   . NICM (nonischemic cardiomyopathy) ([place])    a. 06/2013 MV: low risk study w/o ischemia;  b. 05/2015 Echo: EF 35-40%, diff HK, basal-midinferoseptal and basal-midanteroseptal AK. Mild-mod MR, mod dil LA, mild to mod TR, PASP 38mmHg.  Marland Kitchen PAD (peripheral artery disease) ([place])   . Pneumonia    30 yrs. ago    Past Surgical History:  Procedure Laterality Date  . ABDOMINAL AORTOGRAM W/LOWER EXTREMITY Bilateral 12/06/2019   Procedure: ABDOMINAL AORTOGRAM  W/LOWER EXTREMITY;  Surgeon: [name], MD;  Location: [place] [facility];  Service: Cardiovascular;  Laterality: Bilateral;  . ABDOMINAL AORTOGRAM W/LOWER EXTREMITY Bilateral 05/15/2020   Procedure: ABDOMINAL AORTOGRAM W/LOWER EXTREMITY;  Surgeon: [name], MD;  Location: [place] CV LAB;  Service: Cardiovascular;  Laterality: Bilateral;  . ABDOMINAL HYSTERECTOMY    . APPLICATION OF WOUND VAC  01/10/2020   BLEEDING FROM FEMORAL BYPASS  . BIOPSY  04/08/2020   Procedure: BIOPSY;  Surgeon: Jackquline Denmark, MD;  [facility];  Service: Endoscopy;;  . CARDIOVERSION    . COLONOSCOPY WITH PROPOFOL N/A 04/08/2020   Procedure: COLONOSCOPY WITH PROPOFOL;  Surgeon: Jackquline Denmark, MD;  Location: [facility] ENDOSCOPY;  Service: Endoscopy;  Laterality: N/A;  . CYST EXCISION     removed on right ovary  . CYSTECTOMY    . ESOPHAGOGASTRODUODENOSCOPY (EGD) WITH PROPOFOL N/A 04/08/2020   Procedure: ESOPHAGOGASTRODUODENOSCOPY (EGD) WITH PROPOFOL;  Surgeon: Jackquline Denmark, MD;  Location: [facility] ENDOSCOPY;  Service: Endoscopy;  Laterality: N/A;  . FEMORAL ARTERY - POPLITEAL ARTERY BYPASS GRAFT  12/15/2019   Right common femoral artery to below-knee popliteal artery bypass with ipsilateral nonreversed great saphenous vein  . FEMORAL-POPLITEAL BYPASS GRAFT Right 12/15/2019   Procedure: BYPASS GRAFT FEMORAL-POPLITEAL ARTERY;  Surgeon: [name], MD;  Location: [place];  Service: Vascular;  Laterality: Right;  . HYSTEROTOMY    . PERIPHERAL VASCULAR INTERVENTION Right 12/06/2019   Procedure: PERIPHERAL VASCULAR INTERVENTION;  Surgeon: [name], MD;  Location: [place] [facility] LAB;  Service: Cardiovascular;  Laterality: Right;  EXT ILIAC  . POLYPECTOMY  04/08/2020   Procedure: POLYPECTOMY;  Surgeon: Jackquline Denmark, MD;  Location: [facility]COPY;  Service: Endoscopy;;  . WOUND EXPLORATION Right 01/10/2020   Procedure: right lower leg Wound Exploration with wound vac application;  Surgeon:  Serafina Mitchell, MD;  [facility];  Service: Vascular;  Laterality: Right;    MEDICATIONS: . Dulaglutide (TRULICITY) 2.95 JO/8.4ZY SOPN  . aspirin EC 81 MG tablet  . atorvastatin (LIPITOR) 20 MG tablet  . cetirizine (ZYRTEC) 10 MG tablet  . clopidogrel (PLAVIX) 75 MG tablet  . clorazepate (TRANXENE-T) 7.5 MG tablet  . digoxin (LANOXIN) 0.125 MG tablet  . furosemide (LASIX) 20 MG tablet  . glipiZIDE (GLUCOTROL XL) 10 MG 24 hr tablet  . metoprolol (TOPROL-XL) 200 MG 24 hr tablet  . nilotinib (TASIGNA) 150 MG capsule  . oxyCODONE-acetaminophen (PERCOCET) 5-325 MG tablet  . pantoprazole (PROTONIX) 40 MG tablet  . polyethylene glycol (MIRALAX / GLYCOLAX) 17 g packet  . sitaGLIPtin (JANUVIA) 50 MG tablet  . spironolactone (ALDACTONE) 25 MG tablet  . SYNTHROID 137 MCG tablet   No current facility-administered medications for this encounter.   She is not currently taking aspirin  or Plavix.   Myra Gianotti, PA-C Surgical Short Stay/Anesthesiology [facility] Phone [phone] [facility] Phone [phone] 06/13/2020 4:59 PM

## 2020-06-13 NOTE — Anesthesia Preprocedure Evaluation (Addendum)
Anesthesia Evaluation  Patient identified by MRN, date of birth, ID band Patient awake    Reviewed: Allergy & Precautions, NPO status , Patient's Chart, lab work & pertinent test results  History of Anesthesia Complications Negative for: history of anesthetic complications  Airway Mallampati: II  TM Distance: >3 FB Neck ROM: Full    Dental  (+) Dental Advisory Given   Pulmonary former smoker,  06/19/2020 SARS coronavirus NEG   breath sounds clear to auscultation       Cardiovascular hypertension, Pt. on medications (-) angina+ Peripheral Vascular Disease  + dysrhythmias Atrial Fibrillation  Rhythm:Regular Rate:Normal  '19 ECHO: mod dilated LV with EF 45-50%, severe hypokinesis of septum, mild MR   Neuro/Psych negative neurological ROS     GI/Hepatic Neg liver ROS, GERD  Medicated and Controlled,  Endo/Other  diabetes (glu 217), Oral Hypoglycemic AgentsHypothyroidism   Renal/GU Renal InsufficiencyRenal disease (creat 2.24)     Musculoskeletal   Abdominal   Peds  Hematology CML plavix   Anesthesia Other Findings   Reproductive/Obstetrics                            Anesthesia Physical Anesthesia Plan  ASA: III  Anesthesia Plan: General   Post-op Pain Management:    Induction: Intravenous  PONV Risk Score and Plan: 3 and Ondansetron, Dexamethasone and Treatment may vary due to age or medical condition  Airway Management Planned: Oral ETT  Additional Equipment: Arterial line  Intra-op Plan:   Post-operative Plan: Extubation in OR  Informed Consent: I have reviewed the patients History and Physical, chart, labs and discussed the procedure including the risks, benefits and alternatives for the proposed anesthesia with the patient or authorized representative who has indicated his/her understanding and acceptance.     Dental advisory given  Plan Discussed with: CRNA and  Surgeon  Anesthesia Plan Comments: (PAT note written by Myra Gianotti, PA-C. )      Anesthesia Quick Evaluation

## 2020-06-17 ENCOUNTER — Other Ambulatory Visit (HOSPITAL_COMMUNITY): Payer: PPO

## 2020-06-17 DIAGNOSIS — N183 Chronic kidney disease, stage 3 unspecified: Secondary | ICD-10-CM | POA: Diagnosis not present

## 2020-06-17 DIAGNOSIS — Z6827 Body mass index (BMI) 27.0-27.9, adult: Secondary | ICD-10-CM | POA: Diagnosis not present

## 2020-06-17 DIAGNOSIS — E119 Type 2 diabetes mellitus without complications: Secondary | ICD-10-CM | POA: Diagnosis not present

## 2020-06-17 DIAGNOSIS — I739 Peripheral vascular disease, unspecified: Secondary | ICD-10-CM | POA: Diagnosis not present

## 2020-06-19 ENCOUNTER — Other Ambulatory Visit (HOSPITAL_COMMUNITY)
Admission: RE | Admit: 2020-06-19 | Discharge: 2020-06-19 | Disposition: A | Discharge status: Home / Self Care | Payer: PPO | Source: Ambulatory Visit | Attending: Vascular Surgery | Admitting: Vascular Surgery

## 2020-06-19 DIAGNOSIS — I129 Hypertensive chronic kidney disease with stage 1 through stage 4 chronic kidney disease, or unspecified chronic kidney disease: Secondary | ICD-10-CM | POA: Diagnosis not present

## 2020-06-19 DIAGNOSIS — K219 Gastro-esophageal reflux disease without esophagitis: Secondary | ICD-10-CM | POA: Diagnosis present

## 2020-06-19 DIAGNOSIS — N183 Chronic kidney disease, stage 3 unspecified: Secondary | ICD-10-CM | POA: Diagnosis present

## 2020-06-19 DIAGNOSIS — I130 Hypertensive heart and chronic kidney disease with heart failure and stage 1 through stage 4 chronic kidney disease, or unspecified chronic kidney disease: Secondary | ICD-10-CM | POA: Diagnosis present

## 2020-06-19 DIAGNOSIS — Z20822 Contact with and (suspected) exposure to covid-19: Secondary | ICD-10-CM | POA: Diagnosis present

## 2020-06-19 DIAGNOSIS — Z833 Family history of diabetes mellitus: Secondary | ICD-10-CM | POA: Diagnosis not present

## 2020-06-19 DIAGNOSIS — E11621 Type 2 diabetes mellitus with foot ulcer: Secondary | ICD-10-CM | POA: Diagnosis not present

## 2020-06-19 DIAGNOSIS — E1151 Type 2 diabetes mellitus with diabetic peripheral angiopathy without gangrene: Secondary | ICD-10-CM | POA: Diagnosis not present

## 2020-06-19 DIAGNOSIS — Z87891 Personal history of nicotine dependence: Secondary | ICD-10-CM | POA: Diagnosis not present

## 2020-06-19 DIAGNOSIS — I70245 Atherosclerosis of native arteries of left leg with ulceration of other part of foot: Secondary | ICD-10-CM | POA: Diagnosis not present

## 2020-06-19 DIAGNOSIS — Z856 Personal history of leukemia: Secondary | ICD-10-CM | POA: Diagnosis not present

## 2020-06-19 DIAGNOSIS — Z7984 Long term (current) use of oral hypoglycemic drugs: Secondary | ICD-10-CM | POA: Diagnosis not present

## 2020-06-19 DIAGNOSIS — D62 Acute posthemorrhagic anemia: Secondary | ICD-10-CM | POA: Diagnosis not present

## 2020-06-19 DIAGNOSIS — I5022 Chronic systolic (congestive) heart failure: Secondary | ICD-10-CM | POA: Diagnosis not present

## 2020-06-19 DIAGNOSIS — Z79899 Other long term (current) drug therapy: Secondary | ICD-10-CM | POA: Diagnosis not present

## 2020-06-19 DIAGNOSIS — E1122 Type 2 diabetes mellitus with diabetic chronic kidney disease: Secondary | ICD-10-CM | POA: Diagnosis present

## 2020-06-19 DIAGNOSIS — I998 Other disorder of circulatory system: Secondary | ICD-10-CM | POA: Diagnosis not present

## 2020-06-19 DIAGNOSIS — Z79890 Hormone replacement therapy (postmenopausal): Secondary | ICD-10-CM | POA: Diagnosis not present

## 2020-06-19 DIAGNOSIS — I70222 Atherosclerosis of native arteries of extremities with rest pain, left leg: Secondary | ICD-10-CM | POA: Diagnosis not present

## 2020-06-19 DIAGNOSIS — L97529 Non-pressure chronic ulcer of other part of left foot with unspecified severity: Secondary | ICD-10-CM | POA: Diagnosis present

## 2020-06-19 LAB — SARS CORONAVIRUS 2 (TAT 6-24 HRS): SARS Coronavirus 2: NEGATIVE

## 2020-06-20 ENCOUNTER — Inpatient Hospital Stay (HOSPITAL_COMMUNITY): Payer: PPO | Admitting: Vascular Surgery

## 2020-06-20 ENCOUNTER — Encounter (HOSPITAL_COMMUNITY): Payer: Self-pay | Admitting: Vascular Surgery

## 2020-06-20 ENCOUNTER — Inpatient Hospital Stay (HOSPITAL_COMMUNITY)
Admission: RE | Admit: 2020-06-20 | Discharge: 2020-06-22 | DRG: 253 | Disposition: A | Discharge status: Home / Self Care | Payer: PPO | Attending: Vascular Surgery | Admitting: Vascular Surgery

## 2020-06-20 ENCOUNTER — Encounter (HOSPITAL_COMMUNITY)
Admission: RE | Disposition: A | Discharge status: Home / Self Care | Payer: Self-pay | Source: Home / Self Care | Attending: Vascular Surgery

## 2020-06-20 ENCOUNTER — Other Ambulatory Visit: Payer: Self-pay

## 2020-06-20 DIAGNOSIS — Z7984 Long term (current) use of oral hypoglycemic drugs: Secondary | ICD-10-CM

## 2020-06-20 DIAGNOSIS — I5022 Chronic systolic (congestive) heart failure: Secondary | ICD-10-CM | POA: Diagnosis present

## 2020-06-20 DIAGNOSIS — E1151 Type 2 diabetes mellitus with diabetic peripheral angiopathy without gangrene: Principal | ICD-10-CM | POA: Diagnosis present

## 2020-06-20 DIAGNOSIS — Z79899 Other long term (current) drug therapy: Secondary | ICD-10-CM | POA: Diagnosis not present

## 2020-06-20 DIAGNOSIS — I70222 Atherosclerosis of native arteries of extremities with rest pain, left leg: Secondary | ICD-10-CM | POA: Diagnosis present

## 2020-06-20 DIAGNOSIS — I130 Hypertensive heart and chronic kidney disease with heart failure and stage 1 through stage 4 chronic kidney disease, or unspecified chronic kidney disease: Secondary | ICD-10-CM | POA: Diagnosis present

## 2020-06-20 DIAGNOSIS — D62 Acute posthemorrhagic anemia: Secondary | ICD-10-CM | POA: Diagnosis not present

## 2020-06-20 DIAGNOSIS — Z856 Personal history of leukemia: Secondary | ICD-10-CM

## 2020-06-20 DIAGNOSIS — Z79890 Hormone replacement therapy (postmenopausal): Secondary | ICD-10-CM | POA: Diagnosis not present

## 2020-06-20 DIAGNOSIS — I998 Other disorder of circulatory system: Secondary | ICD-10-CM | POA: Diagnosis not present

## 2020-06-20 DIAGNOSIS — Z833 Family history of diabetes mellitus: Secondary | ICD-10-CM | POA: Diagnosis not present

## 2020-06-20 DIAGNOSIS — Z87891 Personal history of nicotine dependence: Secondary | ICD-10-CM | POA: Diagnosis not present

## 2020-06-20 DIAGNOSIS — Z20822 Contact with and (suspected) exposure to covid-19: Secondary | ICD-10-CM | POA: Diagnosis present

## 2020-06-20 DIAGNOSIS — N183 Chronic kidney disease, stage 3 unspecified: Secondary | ICD-10-CM | POA: Diagnosis present

## 2020-06-20 DIAGNOSIS — I739 Peripheral vascular disease, unspecified: Secondary | ICD-10-CM | POA: Diagnosis present

## 2020-06-20 DIAGNOSIS — I70245 Atherosclerosis of native arteries of left leg with ulceration of other part of foot: Secondary | ICD-10-CM | POA: Diagnosis present

## 2020-06-20 DIAGNOSIS — E1122 Type 2 diabetes mellitus with diabetic chronic kidney disease: Secondary | ICD-10-CM | POA: Diagnosis present

## 2020-06-20 DIAGNOSIS — K219 Gastro-esophageal reflux disease without esophagitis: Secondary | ICD-10-CM | POA: Diagnosis present

## 2020-06-20 DIAGNOSIS — L97529 Non-pressure chronic ulcer of other part of left foot with unspecified severity: Secondary | ICD-10-CM | POA: Diagnosis present

## 2020-06-20 DIAGNOSIS — E11621 Type 2 diabetes mellitus with foot ulcer: Secondary | ICD-10-CM | POA: Diagnosis present

## 2020-06-20 HISTORY — PX: ENDARTERECTOMY FEMORAL: SHX5804

## 2020-06-20 HISTORY — PX: FEMORAL-POPLITEAL BYPASS GRAFT: SHX937

## 2020-06-20 LAB — BASIC METABOLIC PANEL
Anion gap: 14 (ref 5–15)
BUN: 49 mg/dL — ABNORMAL HIGH (ref 8–23)
CO2: 24 mmol/L (ref 22–32)
Calcium: 9.5 mg/dL (ref 8.9–10.3)
Chloride: 99 mmol/L (ref 98–111)
Creatinine, Ser: 2.04 mg/dL — ABNORMAL HIGH (ref 0.44–1.00)
GFR calc Af Amer: 26 mL/min — ABNORMAL LOW (ref >60)
GFR calc non Af Amer: 23 mL/min — ABNORMAL LOW (ref >60)
Glucose, Bld: 217 mg/dL — ABNORMAL HIGH (ref 70–99)
Potassium: 4.3 mmol/L (ref 3.5–5.1)
Sodium: 137 mmol/L (ref 135–145)

## 2020-06-20 LAB — TYPE AND SCREEN
ABO/RH(D): A POS
Antibody Screen: NEGATIVE

## 2020-06-20 LAB — GLUCOSE, CAPILLARY
Glucose-Capillary: 201 mg/dL — ABNORMAL HIGH (ref 70–99)
Glucose-Capillary: 268 mg/dL — ABNORMAL HIGH (ref 70–99)
Glucose-Capillary: 293 mg/dL — ABNORMAL HIGH (ref 70–99)

## 2020-06-20 SURGERY — ENDARTERECTOMY, FEMORAL
Anesthesia: General | Site: Leg Upper | Laterality: Left

## 2020-06-20 MED ORDER — LINAGLIPTIN 5 MG PO TABS
5.0000 mg | ORAL_TABLET | Freq: Every day | ORAL | Status: DC
  Administered 2020-06-21 – 2020-06-22 (×2): 5 mg via ORAL
  Filled 2020-06-20 (×2): qty 1

## 2020-06-20 MED ORDER — METOPROLOL TARTRATE 5 MG/5ML IV SOLN: 2.0000 mg | INTRAVENOUS | Status: DC | PRN

## 2020-06-20 MED ORDER — HEPARIN SODIUM (PORCINE) 1000 UNIT/ML IJ SOLN
INTRAMUSCULAR | Status: DC | PRN
Start: 2020-06-20 — End: 2020-06-20
  Administered 2020-06-20 (×2): 3000 [IU] via INTRAVENOUS
  Administered 2020-06-20: 8000 [IU] via INTRAVENOUS

## 2020-06-20 MED ORDER — LORATADINE 10 MG PO TABS
10.0000 mg | ORAL_TABLET | Freq: Every day | ORAL | Status: DC
  Administered 2020-06-21 – 2020-06-22 (×2): 10 mg via ORAL
  Filled 2020-06-20 (×2): qty 1

## 2020-06-20 MED ORDER — FENTANYL CITRATE (PF) 250 MCG/5ML IJ SOLN
INTRAMUSCULAR | Status: DC | PRN
  Administered 2020-06-20: 50 ug via INTRAVENOUS
  Administered 2020-06-20 (×2): 100 ug via INTRAVENOUS
  Administered 2020-06-20: 50 ug via INTRAVENOUS

## 2020-06-20 MED ORDER — ONDANSETRON HCL 4 MG/2ML IJ SOLN
INTRAMUSCULAR | Status: AC
  Filled 2020-06-20: qty 2

## 2020-06-20 MED ORDER — DULAGLUTIDE 0.75 MG/0.5ML ~~LOC~~ SOAJ: 0.7500 mg | SUBCUTANEOUS | Status: DC

## 2020-06-20 MED ORDER — OXYCODONE-ACETAMINOPHEN 5-325 MG PO TABS: 1.0000 | ORAL_TABLET | ORAL | Status: DC | PRN

## 2020-06-20 MED ORDER — ALUM & MAG HYDROXIDE-SIMETH 200-200-20 MG/5ML PO SUSP: 15.0000 mL | ORAL | Status: DC | PRN

## 2020-06-20 MED ORDER — PHENYLEPHRINE HCL-NACL 10-0.9 MG/250ML-% IV SOLN
INTRAVENOUS | Status: DC | PRN
Start: 2020-06-20 — End: 2020-06-20
  Administered 2020-06-20: 20 ug/min via INTRAVENOUS

## 2020-06-20 MED ORDER — ONDANSETRON HCL 4 MG/2ML IJ SOLN: 4.0000 mg | Freq: Four times a day (QID) | INTRAMUSCULAR | Status: DC | PRN

## 2020-06-20 MED ORDER — DIGOXIN 0.0625 MG HALF TABLET
0.0625 mg | ORAL_TABLET | ORAL | Status: DC
  Administered 2020-06-22: 0.0625 mg via ORAL
  Filled 2020-06-20 (×2): qty 1

## 2020-06-20 MED ORDER — DEXAMETHASONE SODIUM PHOSPHATE 10 MG/ML IJ SOLN
INTRAMUSCULAR | Status: DC | PRN
  Administered 2020-06-20: 5 mg via INTRAVENOUS

## 2020-06-20 MED ORDER — HEMOSTATIC AGENTS (NO CHARGE) OPTIME
TOPICAL | Status: DC | PRN
  Administered 2020-06-20 (×2): 1 via TOPICAL

## 2020-06-20 MED ORDER — INSULIN ASPART 100 UNIT/ML ~~LOC~~ SOLN
SUBCUTANEOUS | Status: AC
  Filled 2020-06-20: qty 1

## 2020-06-20 MED ORDER — ACETAMINOPHEN 325 MG PO TABS: 325.0000 mg | ORAL_TABLET | ORAL | Status: DC | PRN

## 2020-06-20 MED ORDER — ALBUMIN HUMAN 5 % IV SOLN
INTRAVENOUS | Status: DC | PRN
Start: 2020-06-20 — End: 2020-06-20

## 2020-06-20 MED ORDER — POTASSIUM CHLORIDE CRYS ER 20 MEQ PO TBCR: 20.0000 meq | EXTENDED_RELEASE_TABLET | Freq: Every day | ORAL | Status: DC | PRN

## 2020-06-20 MED ORDER — ROCURONIUM BROMIDE 10 MG/ML (PF) SYRINGE
PREFILLED_SYRINGE | INTRAVENOUS | Status: DC | PRN
  Administered 2020-06-20: 25 mg via INTRAVENOUS
  Administered 2020-06-20: 50 mg via INTRAVENOUS
  Administered 2020-06-20: 20 mg via INTRAVENOUS
  Administered 2020-06-20: 25 mg via INTRAVENOUS

## 2020-06-20 MED ORDER — LEVOTHYROXINE SODIUM 137 MCG PO TABS
137.0000 ug | ORAL_TABLET | Freq: Every day | ORAL | Status: DC
  Administered 2020-06-21 – 2020-06-22 (×2): 137 ug via ORAL
  Filled 2020-06-20 (×2): qty 1

## 2020-06-20 MED ORDER — SUGAMMADEX SODIUM 200 MG/2ML IV SOLN
INTRAVENOUS | Status: DC | PRN
  Administered 2020-06-20: 200 mg via INTRAVENOUS

## 2020-06-20 MED ORDER — CEFAZOLIN SODIUM-DEXTROSE 2-4 GM/100ML-% IV SOLN
2.0000 g | INTRAVENOUS | Status: AC
  Administered 2020-06-20: 2 g via INTRAVENOUS

## 2020-06-20 MED ORDER — FUROSEMIDE 40 MG PO TABS
40.0000 mg | ORAL_TABLET | Freq: Every day | ORAL | Status: DC
  Administered 2020-06-21 – 2020-06-22 (×2): 40 mg via ORAL
  Filled 2020-06-20 (×2): qty 1

## 2020-06-20 MED ORDER — LIDOCAINE 2% (20 MG/ML) 5 ML SYRINGE
INTRAMUSCULAR | Status: DC | PRN
  Administered 2020-06-20: 30 mg via INTRAVENOUS

## 2020-06-20 MED ORDER — ROCURONIUM BROMIDE 10 MG/ML (PF) SYRINGE
PREFILLED_SYRINGE | INTRAVENOUS | Status: AC
  Filled 2020-06-20: qty 10

## 2020-06-20 MED ORDER — HEPARIN SODIUM (PORCINE) 5000 UNIT/ML IJ SOLN
5000.0000 [IU] | Freq: Three times a day (TID) | INTRAMUSCULAR | Status: DC
  Administered 2020-06-21 – 2020-06-22 (×5): 5000 [IU] via SUBCUTANEOUS
  Filled 2020-06-20 (×3): qty 1

## 2020-06-20 MED ORDER — HYDRALAZINE HCL 20 MG/ML IJ SOLN: 5.0000 mg | INTRAMUSCULAR | Status: DC | PRN

## 2020-06-20 MED ORDER — PHENYLEPHRINE 40 MCG/ML (10ML) SYRINGE FOR IV PUSH (FOR BLOOD PRESSURE SUPPORT)
PREFILLED_SYRINGE | INTRAVENOUS | Status: AC
  Filled 2020-06-20: qty 10

## 2020-06-20 MED ORDER — DEXAMETHASONE SODIUM PHOSPHATE 10 MG/ML IJ SOLN
INTRAMUSCULAR | Status: AC
  Filled 2020-06-20: qty 1

## 2020-06-20 MED ORDER — PROTAMINE SULFATE 10 MG/ML IV SOLN
INTRAVENOUS | Status: AC
  Filled 2020-06-20: qty 5

## 2020-06-20 MED ORDER — SODIUM CHLORIDE 0.9 % IV SOLN: INTRAVENOUS | Status: DC

## 2020-06-20 MED ORDER — SODIUM CHLORIDE 0.9 % IV SOLN: INTRAVENOUS | Status: DC | PRN

## 2020-06-20 MED ORDER — INSULIN ASPART 100 UNIT/ML ~~LOC~~ SOLN
0.0000 [IU] | Freq: Every day | SUBCUTANEOUS | Status: DC
  Administered 2020-06-20: 3 [IU] via SUBCUTANEOUS
  Administered 2020-06-21: 2 [IU] via SUBCUTANEOUS

## 2020-06-20 MED ORDER — PHENOL 1.4 % MT LIQD: 1.0000 | OROMUCOSAL | Status: DC | PRN

## 2020-06-20 MED ORDER — FENTANYL CITRATE (PF) 100 MCG/2ML IJ SOLN
25.0000 ug | INTRAMUSCULAR | Status: DC | PRN
  Administered 2020-06-20: 25 ug via INTRAVENOUS

## 2020-06-20 MED ORDER — MORPHINE SULFATE (PF) 2 MG/ML IV SOLN: 2.0000 mg | INTRAVENOUS | Status: DC | PRN

## 2020-06-20 MED ORDER — FENTANYL CITRATE (PF) 250 MCG/5ML IJ SOLN
INTRAMUSCULAR | Status: AC
  Filled 2020-06-20: qty 5

## 2020-06-20 MED ORDER — SODIUM CHLORIDE 0.9 % IV SOLN: 500.0000 mL | Freq: Once | INTRAVENOUS | Status: DC | PRN

## 2020-06-20 MED ORDER — CHLORHEXIDINE GLUCONATE 0.12 % MT SOLN
OROMUCOSAL | Status: AC
  Administered 2020-06-20: 15 mL via OROMUCOSAL
  Filled 2020-06-20: qty 15

## 2020-06-20 MED ORDER — CEFAZOLIN SODIUM-DEXTROSE 2-4 GM/100ML-% IV SOLN
INTRAVENOUS | Status: AC
  Filled 2020-06-20: qty 100

## 2020-06-20 MED ORDER — LABETALOL HCL 5 MG/ML IV SOLN
INTRAVENOUS | Status: AC
  Filled 2020-06-20: qty 4

## 2020-06-20 MED ORDER — CLORAZEPATE DIPOTASSIUM 3.75 MG PO TABS: 7.5000 mg | ORAL_TABLET | Freq: Every day | ORAL | Status: DC | PRN

## 2020-06-20 MED ORDER — LABETALOL HCL 5 MG/ML IV SOLN
10.0000 mg | INTRAVENOUS | Status: DC | PRN
  Administered 2020-06-20: 10 mg via INTRAVENOUS

## 2020-06-20 MED ORDER — LIDOCAINE 2% (20 MG/ML) 5 ML SYRINGE
INTRAMUSCULAR | Status: AC
  Filled 2020-06-20: qty 5

## 2020-06-20 MED ORDER — CHLORHEXIDINE GLUCONATE CLOTH 2 % EX PADS: 6.0000 | MEDICATED_PAD | Freq: Once | CUTANEOUS | Status: DC

## 2020-06-20 MED ORDER — SPIRONOLACTONE 12.5 MG HALF TABLET
12.5000 mg | ORAL_TABLET | Freq: Two times a day (BID) | ORAL | Status: DC
  Administered 2020-06-20 – 2020-06-22 (×4): 12.5 mg via ORAL
  Filled 2020-06-20 (×5): qty 1

## 2020-06-20 MED ORDER — GUAIFENESIN-DM 100-10 MG/5ML PO SYRP: 15.0000 mL | ORAL_SOLUTION | ORAL | Status: DC | PRN

## 2020-06-20 MED ORDER — ASPIRIN EC 81 MG PO TBEC
81.0000 mg | DELAYED_RELEASE_TABLET | Freq: Every day | ORAL | Status: DC
  Administered 2020-06-21 – 2020-06-22 (×2): 81 mg via ORAL
  Filled 2020-06-20 (×2): qty 1

## 2020-06-20 MED ORDER — PROPOFOL 10 MG/ML IV BOLUS
INTRAVENOUS | Status: AC
  Filled 2020-06-20: qty 20

## 2020-06-20 MED ORDER — ZOLPIDEM TARTRATE 5 MG PO TABS: 5.0000 mg | ORAL_TABLET | Freq: Every evening | ORAL | Status: DC | PRN

## 2020-06-20 MED ORDER — ATORVASTATIN CALCIUM 10 MG PO TABS
20.0000 mg | ORAL_TABLET | Freq: Every day | ORAL | Status: DC
  Administered 2020-06-21: 20 mg via ORAL
  Filled 2020-06-20: qty 2

## 2020-06-20 MED ORDER — MAGNESIUM SULFATE 2 GM/50ML IV SOLN: 2.0000 g | Freq: Every day | INTRAVENOUS | Status: DC | PRN

## 2020-06-20 MED ORDER — ACETAMINOPHEN 325 MG RE SUPP
325.0000 mg | RECTAL | Status: DC | PRN
  Filled 2020-06-20: qty 2

## 2020-06-20 MED ORDER — SENNOSIDES-DOCUSATE SODIUM 8.6-50 MG PO TABS: 1.0000 | ORAL_TABLET | Freq: Every evening | ORAL | Status: DC | PRN

## 2020-06-20 MED ORDER — PHENYLEPHRINE 40 MCG/ML (10ML) SYRINGE FOR IV PUSH (FOR BLOOD PRESSURE SUPPORT)
PREFILLED_SYRINGE | INTRAVENOUS | Status: DC | PRN
  Administered 2020-06-20: 60 ug via INTRAVENOUS
  Administered 2020-06-20 (×2): 80 ug via INTRAVENOUS
  Administered 2020-06-20: 40 ug via INTRAVENOUS
  Administered 2020-06-20: 80 ug via INTRAVENOUS
  Administered 2020-06-20: 40 ug via INTRAVENOUS

## 2020-06-20 MED ORDER — CEFAZOLIN SODIUM-DEXTROSE 2-4 GM/100ML-% IV SOLN
2.0000 g | Freq: Three times a day (TID) | INTRAVENOUS | Status: AC
  Administered 2020-06-20 – 2020-06-21 (×2): 2 g via INTRAVENOUS
  Filled 2020-06-20 (×2): qty 100

## 2020-06-20 MED ORDER — SODIUM CHLORIDE 0.9 % IV SOLN
INTRAVENOUS | Status: AC
  Filled 2020-06-20: qty 1.2

## 2020-06-20 MED ORDER — PROPOFOL 10 MG/ML IV BOLUS
INTRAVENOUS | Status: DC | PRN
  Administered 2020-06-20: 90 mg via INTRAVENOUS

## 2020-06-20 MED ORDER — ONDANSETRON HCL 4 MG/2ML IJ SOLN
INTRAMUSCULAR | Status: DC | PRN
  Administered 2020-06-20: 4 mg via INTRAVENOUS

## 2020-06-20 MED ORDER — FUROSEMIDE 20 MG PO TABS: 20.0000 mg | ORAL_TABLET | ORAL | Status: DC

## 2020-06-20 MED ORDER — DOCUSATE SODIUM 100 MG PO CAPS
100.0000 mg | ORAL_CAPSULE | Freq: Every day | ORAL | Status: DC
  Administered 2020-06-21 – 2020-06-22 (×2): 100 mg via ORAL
  Filled 2020-06-20 (×2): qty 1

## 2020-06-20 MED ORDER — FENTANYL CITRATE (PF) 100 MCG/2ML IJ SOLN
INTRAMUSCULAR | Status: AC
  Filled 2020-06-20: qty 2

## 2020-06-20 MED ORDER — METOPROLOL SUCCINATE ER 100 MG PO TB24
200.0000 mg | ORAL_TABLET | Freq: Every day | ORAL | Status: DC
  Administered 2020-06-21 – 2020-06-22 (×2): 200 mg via ORAL
  Filled 2020-06-20: qty 2
  Filled 2020-06-20: qty 1
  Filled 2020-06-20: qty 2

## 2020-06-20 MED ORDER — PANTOPRAZOLE SODIUM 40 MG PO TBEC
40.0000 mg | DELAYED_RELEASE_TABLET | Freq: Every day | ORAL | Status: DC
  Administered 2020-06-21 – 2020-06-22 (×2): 40 mg via ORAL
  Filled 2020-06-20 (×2): qty 1

## 2020-06-20 MED ORDER — 0.9 % SODIUM CHLORIDE (POUR BTL) OPTIME
TOPICAL | Status: DC | PRN
  Administered 2020-06-20: 2000 mL

## 2020-06-20 MED ORDER — HEPARIN SODIUM (PORCINE) 1000 UNIT/ML IJ SOLN
INTRAMUSCULAR | Status: AC
  Filled 2020-06-20: qty 2

## 2020-06-20 MED ORDER — CHLORHEXIDINE GLUCONATE 0.12 % MT SOLN: 15.0000 mL | OROMUCOSAL | Status: AC

## 2020-06-20 MED ORDER — NILOTINIB HCL 150 MG PO CAPS
300.0000 mg | ORAL_CAPSULE | Freq: Two times a day (BID) | ORAL | Status: DC
  Administered 2020-06-20 – 2020-06-22 (×4): 300 mg via ORAL
  Filled 2020-06-20 (×5): qty 2

## 2020-06-20 MED ORDER — INSULIN ASPART 100 UNIT/ML ~~LOC~~ SOLN
0.0000 [IU] | Freq: Three times a day (TID) | SUBCUTANEOUS | Status: DC
  Administered 2020-06-20: 8 [IU] via SUBCUTANEOUS
  Administered 2020-06-21: 5 [IU] via SUBCUTANEOUS
  Administered 2020-06-21: 8 [IU] via SUBCUTANEOUS
  Administered 2020-06-21: 5 [IU] via SUBCUTANEOUS
  Administered 2020-06-22: 3 [IU] via SUBCUTANEOUS
  Administered 2020-06-22: 5 [IU] via SUBCUTANEOUS

## 2020-06-20 MED ORDER — PROTAMINE SULFATE 10 MG/ML IV SOLN
INTRAVENOUS | Status: DC | PRN
  Administered 2020-06-20: 50 mg via INTRAVENOUS

## 2020-06-20 MED ORDER — LACTATED RINGERS IV SOLN: INTRAVENOUS | Status: DC | PRN

## 2020-06-20 SURGICAL SUPPLY — 65 items
BANDAGE ESMARK 6X9 LF (GAUZE/BANDAGES/DRESSINGS) IMPLANT
BLADE CLIPPER SURG (BLADE) IMPLANT
BNDG ESMARK 6X9 LF (GAUZE/BANDAGES/DRESSINGS)
CANISTER SUCT 3000ML PPV (MISCELLANEOUS) ×4 IMPLANT
CANNULA VESSEL 3MM 2 BLNT TIP (CANNULA) ×4 IMPLANT
CLIP FOGARTY SPRING 6M (CLIP) ×4 IMPLANT
CLIP VESOCCLUDE MED 24/CT (CLIP) ×4 IMPLANT
CLIP VESOCCLUDE SM WIDE 24/CT (CLIP) ×4 IMPLANT
COVER PROBE W GEL 5X96 (DRAPES) ×4 IMPLANT
COVER WAND RF STERILE (DRAPES) ×4 IMPLANT
CUFF TOURN SGL QUICK 24 (TOURNIQUET CUFF)
CUFF TOURN SGL QUICK 34 (TOURNIQUET CUFF)
CUFF TOURN SGL QUICK 42 (TOURNIQUET CUFF) IMPLANT
CUFF TRNQT CYL 24X4X16.5-23 (TOURNIQUET CUFF) IMPLANT
CUFF TRNQT CYL 34X4.125X (TOURNIQUET CUFF) IMPLANT
DERMABOND ADVANCED (GAUZE/BANDAGES/DRESSINGS) ×4
DERMABOND ADVANCED .7 DNX12 (GAUZE/BANDAGES/DRESSINGS) ×4 IMPLANT
DRAIN CHANNEL 15F RND FF W/TCR (WOUND CARE) IMPLANT
DRAPE C-ARM 42X72 X-RAY (DRAPES) ×4 IMPLANT
DRAPE HALF SHEET 40X57 (DRAPES) IMPLANT
DRAPE INCISE IOBAN 66X45 STRL (DRAPES) ×4 IMPLANT
ELECT REM PT RETURN 9FT ADLT (ELECTROSURGICAL) ×4
ELECTRODE REM PT RTRN 9FT ADLT (ELECTROSURGICAL) ×2 IMPLANT
EVACUATOR SILICONE 100CC (DRAIN) IMPLANT
GAUZE 4X4 16PLY RFD (DISPOSABLE) ×4 IMPLANT
GLOVE BIO SURGEON STRL SZ7.5 (GLOVE) ×4 IMPLANT
GLOVE BIOGEL PI IND STRL 8 (GLOVE) ×2 IMPLANT
GLOVE BIOGEL PI INDICATOR 8 (GLOVE) ×2
GOWN STRL REUS W/ TWL LRG LVL3 (GOWN DISPOSABLE) ×4 IMPLANT
GOWN STRL REUS W/ TWL XL LVL3 (GOWN DISPOSABLE) ×4 IMPLANT
GOWN STRL REUS W/TWL LRG LVL3 (GOWN DISPOSABLE) ×4
GOWN STRL REUS W/TWL XL LVL3 (GOWN DISPOSABLE) ×4
HEMOSTAT SNOW SURGICEL 2X4 (HEMOSTASIS) ×4 IMPLANT
HEMOSTAT SPONGE AVITENE ULTRA (HEMOSTASIS) IMPLANT
INSERT FOGARTY SM (MISCELLANEOUS) IMPLANT
KIT BASIN OR (CUSTOM PROCEDURE TRAY) ×4 IMPLANT
KIT TURNOVER KIT B (KITS) ×4 IMPLANT
NS IRRIG 1000ML POUR BTL (IV SOLUTION) ×8 IMPLANT
PACK PERIPHERAL VASCULAR (CUSTOM PROCEDURE TRAY) ×4 IMPLANT
PAD ARMBOARD 7.5X6 YLW CONV (MISCELLANEOUS) ×8 IMPLANT
PATCH VASC XENOSURE 1CMX6CM (Vascular Products) ×2 IMPLANT
PATCH VASC XENOSURE 1X6 (Vascular Products) ×2 IMPLANT
SET MICROPUNCTURE 5F STIFF (MISCELLANEOUS) IMPLANT
SPONGE LAP 18X18 RF (DISPOSABLE) ×8 IMPLANT
STOPCOCK 4 WAY LG BORE MALE ST (IV SETS) IMPLANT
SUT ETHILON 3 0 PS 1 (SUTURE) IMPLANT
SUT GORETEX 5 0 TT13 24 (SUTURE) IMPLANT
SUT GORETEX 6.0 TT13 (SUTURE) IMPLANT
SUT MNCRL AB 4-0 PS2 18 (SUTURE) ×16 IMPLANT
SUT PROLENE 5 0 C 1 24 (SUTURE) ×32 IMPLANT
SUT PROLENE 6 0 BV (SUTURE) ×56 IMPLANT
SUT PROLENE 7 0 BV 1 (SUTURE) IMPLANT
SUT SILK 2 0 PERMA HAND 18 BK (SUTURE) ×4 IMPLANT
SUT SILK 3 0 (SUTURE) ×4
SUT SILK 3-0 18XBRD TIE 12 (SUTURE) ×4 IMPLANT
SUT VIC AB 2-0 CT1 27 (SUTURE) ×10
SUT VIC AB 2-0 CT1 TAPERPNT 27 (SUTURE) ×10 IMPLANT
SUT VIC AB 3-0 SH 27 (SUTURE) ×6
SUT VIC AB 3-0 SH 27X BRD (SUTURE) ×6 IMPLANT
TAPE UMBILICAL COTTON 1/8X30 (MISCELLANEOUS) IMPLANT
TOWEL GREEN STERILE (TOWEL DISPOSABLE) ×4 IMPLANT
TRAY FOLEY MTR SLVR 16FR STAT (SET/KITS/TRAYS/PACK) ×4 IMPLANT
TUBING EXTENTION W/L.L. (IV SETS) IMPLANT
UNDERPAD 30X36 HEAVY ABSORB (UNDERPADS AND DIAPERS) ×4 IMPLANT
WATER STERILE IRR 1000ML POUR (IV SOLUTION) ×4 IMPLANT

## 2020-06-20 NOTE — Anesthesia Procedure Notes (Signed)
Procedure Name: Intubation Date/Time: 06/20/2020 11:19 AM Performed by: Teressa Lower., CRNA Pre-anesthesia Checklist: Patient identified, Emergency Drugs available, Suction available and Patient being monitored Patient Re-evaluated:Patient Re-evaluated prior to induction Oxygen Delivery Method: Circle system utilized Preoxygenation: Pre-oxygenation with 100% oxygen Induction Type: IV induction Ventilation: Mask ventilation without difficulty Laryngoscope Size: Mac and 3 Grade View: Grade I Tube type: Oral Tube size: 7.0 mm Number of attempts: 1 Airway Equipment and Method: Stylet Placement Confirmation: ETT inserted through vocal cords under direct vision,  positive ETCO2 and breath sounds checked- equal and bilateral Secured at: 21 cm Tube secured with: Tape Dental Injury: Teeth and Oropharynx as per pre-operative assessment  Comments: Intubation by paramedic student

## 2020-06-20 NOTE — H&P (Signed)
History and Physical Interval Note:  06/20/2020 10:57 AM  Sara Warner  has presented today for surgery, with the diagnosis of CRITICAL LIMB ISCHEMIA.  The various methods of treatment have been discussed with the patient and family. After consideration of risks, benefits and other options for treatment, the patient has consented to  Procedure(s): LEFT COMMON FEMORAL ENDARTERECTOMY FEMORAL (Left) BYPASS GRAFT FEMORAL-POPLITEAL ARTERY (Left) as a surgical intervention.  The patient's history has been reviewed, patient examined, no change in status, stable for surgery.  I have reviewed the patient's chart and labs.  Questions were answered to the patient's satisfaction.    Left femoral endarterectomy and fem pop bypass for CLI with tissue loss.  High risk to use graft given vein mapping showed no long segment usable vein but will re-evaluate.  [name]  REASON FOR VISIT:3 month follow-upfor surveillance  HPI: Sara Warner a 79 y.o.femalethat presents for ongoing follow-up after right common femoral to below knee pop bypass on 12/15/2019 with great saphenous vein for critical limb ischemia with tissue loss. She also underwent a right external iliac angioplasty with stent using several Lutonix stents on 12/06/2019 prior to her bypass. Prior to surgery she had an ABI 0.2 with a right great toe ulcer. She did require washout of the below-knee popliteal incision on 01/10/2020 with VAC placement after presenting to the ED.  On surveillance today reports that her right leg is doing well. She has no new wounds or other problems with the right foot. The previous ulcer is healed.  She does note that her left fourth toe has an ulceration now has been present for about a month and nonhealing.      Past Medical History:  Diagnosis Date  . Atrial fibrillation ([place])   . Chronic atrial fibrillation ([place])    a. Refuses [place]. CHA2DS2VASc = 4.  . Chronic systolic CHF  (congestive heart failure) ([place])    a. 05/2015 Echo: EF 35-40%.  . CKD (chronic kidney disease), stage III   . CML (chronic myelocytic leukemia) ([place]) 01/10/2013  . DM (diabetes mellitus) ([place])   . Dysrhythmia   . Elevated WBC count   . Epistaxis 04/03/2020   AFTER COVID SWAB  . Hypertension   . Hypertensive heart disease   . Hypothyroidism   . NICM (nonischemic cardiomyopathy) ([place])    a. 06/2013 MV: low risk study w/o ischemia; b. 05/2015 Echo: EF 35-40%, diff HK, basal-midinferoseptal and basal-midanteroseptal AK. Mild-mod MR, mod dil LA, mild to mod TR, PASP 23mmHg.  Marland Kitchen PAD (peripheral artery disease) ([place])   . Pneumonia    30 yrs. ago  . Toxic goiter          Past Surgical History:  Procedure Laterality Date  . ABDOMINAL AORTOGRAM W/LOWER EXTREMITY Bilateral 12/06/2019   Procedure: ABDOMINAL AORTOGRAM W/LOWER EXTREMITY; Surgeon: [name], MD; Location: [place] [facility] LAB; Service: Cardiovascular; Laterality: Bilateral;  . ABDOMINAL HYSTERECTOMY    . APPLICATION OF WOUND VAC  01/10/2020   BLEEDING FROM FEMORAL BYPASS  . BIOPSY  04/08/2020   Procedure: BIOPSY; Surgeon: Jackquline Denmark, MD; Location: [facility] ENDOSCOPY; Service: Endoscopy;;  . CARDIOVERSION    . COLONOSCOPY WITH PROPOFOL N/A 04/08/2020   Procedure: COLONOSCOPY WITH PROPOFOL; Surgeon: Jackquline Denmark, MD; Location: [facility] ENDOSCOPY; Service: Endoscopy; Laterality: N/A;  . CYST EXCISION     removed on right ovary  . CYSTECTOMY    . ESOPHAGOGASTRODUODENOSCOPY (EGD) WITH PROPOFOL N/A 04/08/2020   Procedure: ESOPHAGOGASTRODUODENOSCOPY (EGD) WITH PROPOFOL; Surgeon: Jackquline Denmark, MD;  [facility] ENDOS[facility]; Service: Endoscopy; Laterality: N/A;  . FEMORAL ARTERY - POPLITEAL ARTERY BYPASS GRAFT  12/15/2019   Right common femoral artery to below-knee popliteal artery bypass with ipsilateral nonreversed great saphenous vein  . FEMORAL-POPLITEAL BYPASS GRAFT Right 12/15/2019     Procedure: BYPASS GRAFT FEMORAL-POPLITEAL ARTERY; Surgeon: [name], MD; Location: [place]; Service: Vascular; Laterality: Right;  . HYSTEROTOMY    . PERIPHERAL VASCULAR INTERVENTION Right 12/06/2019   Procedure: PERIPHERAL VASCULAR INTERVENTION; Surgeon: [name], MD; Location: [place] CV LAB; Service: Cardiovascular; Laterality: Right; EXT ILIAC  . POLYPECTOMY  04/08/2020   Procedure: POLYPECTOMY; Surgeon: Jackquline Denmark, MD; Location: [facility] ENDOSCOPY; Service: Endoscopy;;  . WOUND EXPLORATION Right 01/10/2020   Procedure: right lower leg Wound Exploration with wound vac application; Surgeon: Serafina Mitchell, MD; Location: [facility] OR; Service: Vascular; Laterality: Right;         Family History  Problem Relation Age of Onset  . Cancer - Colon Mother    mast to lungs  . Diabetes Father   . Diabetes Paternal Grandmother     SOCIAL HISTORY: Social History        Tobacco Use  . Smoking status: Former Smoker    Types: Cigarettes    Quit date: 01/01/1984    Years since quitting: 36.3  . Smokeless tobacco: Never Used  Substance Use Topics  . Alcohol use: No         Allergies  Allergen Reactions  . Lisinopril Cough  . Losartan Other (See Comments)    Patient reports nightmares/vivid dreams when taking.          Current Outpatient Medications  Medication Sig Dispense Refill  . atorvastatin (LIPITOR) 20 MG tablet TAKE 1 TABLET (20 MG TOTAL) BY MOUTH DAILY AT 6 PM. 90 tablet 1  . bisacodyl (DULCOLAX) 10 MG suppository Place 10 mg rectally as needed for moderate constipation.    . clorazepate (TRANXENE-T) 7.5 MG tablet Take 1 tablet (7.5 mg total) by mouth 2 (two) times daily as needed for anxiety. (Patient taking differently: Take 7.5 mg by mouth daily as needed for anxiety. ) 30 tablet 1  . digoxin (LANOXIN) 0.125 MG tablet Take 0.5 tablets (0.0625 mg total) by mouth every other day. 45 tablet 1   . furosemide (LASIX) 20 MG tablet TAKE 2 TABLETS BY MOUTH IN THE MORNING AND AN EXTRA TABLET IF GAIN 3 OR MORE POUNDS IN 1 DAY 270 tablet 1  . glipiZIDE (GLUCOTROL XL) 10 MG 24 hr tablet Take 10 mg by mouth daily.     . metoprolol (TOPROL-XL) 200 MG 24 hr tablet TAKE 1 TABLET BY MOUTH DAILY. (Patient taking differently: Take 200 mg by mouth daily. ) 90 tablet 2  . nilotinib (TASIGNA) 150 MG capsule Take 2 capsules (300 mg total) by mouth every 12 (twelve) hours. Take on an empty stomach, 1 hour before or 2 hours after meals. 120 capsule 2  . pantoprazole (PROTONIX) 40 MG tablet Take 1 tablet (40 mg total) by mouth daily. 30 tablet 1  . polyethylene glycol (MIRALAX / GLYCOLAX) 17 g packet Take 17 g by mouth daily.    . sitaGLIPtin (JANUVIA) 50 MG tablet Take 50 mg by mouth daily.     Marland Kitchen spironolactone (ALDACTONE) 25 MG tablet Take 0.5 tablets (12.5 mg total) by mouth 2 (two) times daily. (Patient taking differently: Take 12.5 mg by mouth every evening. ) 90 tablet 3  . SYNTHROID 137 MCG tablet Take 137 mcg by mouth daily  before breakfast.    . aspirin EC 81 MG tablet Take 1 tablet (81 mg total) by mouth daily. (Patient not taking: Reported on 05/07/2020)    . clopidogrel (PLAVIX) 75 MG tablet Take 1 tablet (75 mg total) by mouth daily. (Patient not taking: Reported on 05/07/2020) 30 tablet 11  . oxyCODONE-acetaminophen (PERCOCET) 5-325 MG tablet Take 1 tablet by mouth every 6 (six) hours as needed. (Patient not taking: Reported on 05/07/2020) 20 tablet 0   No current facility-administered medications for this visit.    REVIEW OF SYSTEMS: [X]  denotes positive finding, [ ]  denotes negative finding Cardiac  Comments:  Chest pain or chest pressure:    Shortness of breath upon exertion:    Short of breath when lying flat:    Irregular heart rhythm:        Vascular    Pain in calf, thigh, or hip brought on by ambulation:    Pain in feet at night that wakes you up from your  sleep:     Blood clot in your veins:    Leg swelling:         Pulmonary    Oxygen at home:    Productive cough:     Wheezing:         Neurologic    Sudden weakness in arms or legs:     Sudden numbness in arms or legs:     Sudden onset of difficulty speaking or slurred speech:    Temporary loss of vision in one eye:     Problems with dizziness:         Gastrointestinal    Blood in stool:     Vomited blood:         Genitourinary    Burning when urinating:     Blood in urine:        Psychiatric    Major depression:         Hematologic    Bleeding problems:    Problems with blood clotting too easily:        Skin    Rashes or ulcers:        Constitutional    Fever or chills:      PHYSICAL EXAM:    Vitals:   05/07/20 1029  BP: (!) 144/64  Pulse: 81  Resp: 14  Temp: (!) 97.2 F (36.2 C)  TempSrc: Temporal  SpO2: 100%  Weight: 177 lb (80.3 kg)  Height: 5\' 8"  (1.727 m)    GENERAL:The patient is a well-nourished female, in no acute distress. The vital signs are documented above. CARDIAC:There is a regular rate and rhythm.  VASCULAR: Palpable femoral pulses bilaterally Right DP palpable No left pedal pulses palpable Left 4th toe wound as pictured below           DATA:  None  Assessment/Plan:  79 year old female status post right external iliac angioplasty and stent on 12/06/2019 followed by right common femoral to below-knee pop bypass with ipsilateral nonreversed saphenous vein on 12/15/2019 for critical limb ischemia with rest pain and tissue loss.On surveillance there does appear to be some elevated velocities in the distal external iliac stent on the right. That being said she has a good femoral pulse and a good dorsalis pedis pulse in the right foot.  My new concern is that shenowhas a left fourth toe ulceration. ABIs in the left are  0.38monophasic at the ankle with a toe pressure 28. Discussed that I do not think she has enough  flow to heal her ulceration on the left foot as pictured above. I think she needs an updated arteriogram to better define distal targets. Her last arteriogram was done with CO2 and much more focused in the right leg. Did appear to have flush occlusion on the left.This focus will be on the left leg and can also look at her right iliac stent the same time given elevated velocities on duplex. We will schedule for next week.  [name], MD [facility] Office: [phone]

## 2020-06-20 NOTE — Progress Notes (Signed)
Pt received from PACU. Alert and oriented to room and call bell. Foley in place, A line intact. CHG bath complete. Tele box connected to pt and CCMD called. VSS. Will continue to monitor.  Arletta Bale, RN

## 2020-06-20 NOTE — Op Note (Signed)
Date: June 20, 2020  Preoperative diagnosis: Critical limb ischemia of the left lower extremity with tissue loss  Postoperative diagnosis: Same  Procedure: 1.  Left common femoral endarterectomy with profundoplasty and bovine pericardial patch angioplasty 2.  Left above-knee popliteal artery endarterectomy 3.  Harvest of left great saphenous vein 4.  Left common femoral to above-knee popliteal artery bypass with nonreversed ipsilateral great saphenous vein  Surgeon: Dr. [name], MD  Assistant: Paulo Fruit, PA and Leontine Locket, [place]  Indications: Patient is a 79 year old female who presents with critical limb ischemia of the left lower extremity with tissue loss (toe ulcer).  She underwent arteriogram that showed high-grade left common femoral artery stenosis with a flush SFA occlusion and only profunda runoff and she did reconstitute an above knee popliteal artery target.  Her saphenous vein mapping showed usable vein only to the mid to distal thigh segment and ultimately plan was common femoral to above-knee popliteal bypass in order to try to do this with vein after risks and benefits were discussed.  An assistant was needed for exposure and to expedite the case.  Findings: Left common femoral artery was exposed through transverse incision in the left groin.  Ultimately saphenous vein was of adequate size from the saphenofemoral junction down to about the knee joint and then became relatively small for conduit.  Left common femoral endarterectomy was then performed for a large 80% stenosis from calcified plaque this was carried down to the profunda.  Bovine pericardial patch was sewn in the left common femoral artery.  We then sewed a nonreversed vein bypass from the common femoral tunneled subfascial and subsartorial down to the above-knee popliteal artery.  The left above knee popliteal artery did require endarterectomy.  She had brisk dorsalis pedis and posterior signals at  completion.  Anesthesia: General  EBL: 500 mL  Details: Patient was taken to the operating room after informed consent was obtained.  She was placed on operative table in supine position.  General endotracheal anesthesia was induced.  Ultimately I used ultrasound and marked out the course of the saphenous vein from the saphenofemoral junction down to about the knee joint where it became too small distally.  The left leg and both groins were then prepped and draped in usual sterile fashion.  Timeout was performed to identify patient, procedure and site.  Initially made a horizontal groin incision over the left femoral artery dissected down through the subcutaneous tissue with Bovie cautery and then opened the femoral sheath transversely.  Ultimately dissected under the inguinal ligament got distal control of the external iliac where there was a good pulse and then dissected down got loops around the SFA and profunda.  We then turned our attention to the vein that had been marked on the left thigh and through three skip incisions ultimately were able to harvest the saphenous vein from the saphenofemoral junction all the way down to just below the knee.  All side branches were ligated between 3-0 silk ties and divided or small clips.  Ultimately the vein was tied off distally with a right angle clamp below the knee with a 2-0 silk tie and then I pulled it through the skip incisions up to the saphenofemoral junction where it was clamped with a right angle and oversewn with a 5-0 Prolene baseball stitch and then it was passed off the field.  Through my vein harvest incision just above the knee I then dissected down and ultimately was able to dissect out  the distal SFA above-knee popliteal vessels and got Vesseloops proximally distally.  I tried to try as far distal as I could behind the knee since we had a limited amount of vein but the artery felt pretty calcified on the lateral wall all the way down to below the  knee.  That point time we then brought the vein back on the field it was reversed and I put a vessel cannula and then flushed with heparinized saline.  It dilated very nicely.  One 6-0 Prolene patch suture had to be placed.  That point in time I then used a curved Gore tunneler and tunneled from the above-knee popliteal artery subfascial subsartorial up into the groin.  Patient was given 100 units per kilogram heparin.  I then put a profunda clamp on the profunda artery and a Henley clamp on the distal external iliac.  The left common femoral was opened with 11 blade scalpel extended Potts scissors longitudinal fashion.  There was a large posterior calcified plaque with about 80% stenosis in the common femoral.  Ultimately I used a Garment/textile technologist and endarterectomy was performed and eversion technique was performed with the profunda and also carried down the proximal SFA.  I had good inflow and good backbleeding from the profunda.  We cleaned out the endarterectomy site.  Bovine patch was brought on the field and sewn in place to the common femoral 5-0 Prolene in parachute technique.  Once we came off clamps we had good hemostasis.  I had a good doppler signal in the profunda.  I then brought the vein on the field and in nonreversed fashion it was spatulated and then I put a Cooley clamp on the common femoral patch and the patch was opened with 11 blade scalpel extended Potts scissors and end-to-side anastomosis to the left common femoral patch with a 5-0 Prolene in parachute technique.  Once we came off clamps we had a good pulse in the proximal bypass but obviously no pulsatile flow distally given the valves had not been lysed.  I used a Water quality scientist and carefully lysed all the valves and the vein had good pulsatile flow distally.  Then marked the vein for orientation and there was then secured to the tunneler that already been tunneled up into the groin in the subfascial subsartorial space and then it was  passed through the tunnel into down to the above-knee popliteal artery.  That point time I then pulled up on my clamps on the above-knee popliteal artery almost right behind the knee joint and the artery was opened with 11 blade scalpel extended with Potts scissors.  There was a large plaque in the artery here that extended as far distal as I could feel and ultimately I used a Garment/textile technologist and a endarterectomy was performed of the above-knee popliteal artery and I tried to bevel the plaque distally and get as much as I could down below the knee.  The vein was then cut to length and spatulated and end-to-side anastomosis was sewn with 6-0 Prolene in parachute technique to the above-knee popliteal artery.  We de-aired everything prior to completion.  Once we came off clamps we had excellent Doppler signal in the below-knee popliteal artery as well as brisk signals in the dorsalis pedis and posterior tibial in the left foot.  There was a pulse in the bypass.  All the wounds were irrigated and protamine was given for reversal.  All the incisions were closed in multiple layers of 2-0  Vicryl, 3-0 Vicryl, 4-0 Monocryl and Dermabond after the incisions were irrigated.  She taken to PACU in stable condition.  Complication: None  Condition: Stable  [name], MD [facility] Office: [place]

## 2020-06-20 NOTE — Transfer of Care (Signed)
Immediate Anesthesia Transfer of Care Note  Patient: Sara Warner  Procedure(s) Performed: LEFT COMMON FEMORAL ENDARTERECTOMY LEFT ABOVE KNEE POPLITEAL ENDARTERECTOMY (Left Leg Upper) BYPASS GRAFT FEMORAL TO ABOVE KNEE POPLITEAL ARTERY WITH HARVENSTED NON REVERSED GREATER SAPHENOUS VEIN (Left Leg Lower)  Patient Location: PACU  Anesthesia Type:General  Level of Consciousness: awake, alert  and oriented  Airway & Oxygen Therapy: Patient Spontanous Breathing and Patient connected to face mask oxygen  Post-op Assessment: Report given to RN and Post -op Vital signs reviewed and stable  Post vital signs: Reviewed and stable  Last Vitals:  Vitals Value Taken Time  BP 134/81 06/20/20 1611  Temp    Pulse 100 06/20/20 1612  Resp 12 06/20/20 1612  SpO2 100 % 06/20/20 1612  Vitals shown include unvalidated device data.  Last Pain:  Vitals:   06/20/20 1024  TempSrc:   PainSc: 0-No pain         Complications: No complications documented.

## 2020-06-20 NOTE — Anesthesia Procedure Notes (Signed)
Arterial Line Insertion Start/End7/22/2021 10:35 AM, 06/20/2020 10:40 AM Performed by: Teressa Lower., CRNA, CRNA  Patient location: Pre-op. Preanesthetic checklist: patient identified, IV checked, site marked, risks and benefits discussed, surgical consent, monitors and equipment checked, pre-op evaluation, timeout performed and anesthesia consent Lidocaine 1% used for infiltration Left, radial was placed Catheter size: 20 G Hand hygiene performed , maximum sterile barriers used  and Seldinger technique used Allen's test indicative of satisfactory collateral circulation Attempts: 1 Procedure performed without using ultrasound guided technique. Following insertion, dressing applied and Biopatch. Post procedure assessment: normal and unchanged  Patient tolerated the procedure well with no immediate complications.

## 2020-06-20 NOTE — Anesthesia Postprocedure Evaluation (Signed)
Anesthesia Post Note  Patient: Sara Warner  Procedure(s) Performed: LEFT COMMON FEMORAL ENDARTERECTOMY LEFT ABOVE KNEE POPLITEAL ENDARTERECTOMY (Left Leg Upper) BYPASS GRAFT FEMORAL TO ABOVE KNEE POPLITEAL ARTERY WITH HARVENSTED NON REVERSED GREATER SAPHENOUS VEIN (Left Leg Lower)     Patient location during evaluation: PACU Anesthesia Type: General Level of consciousness: patient cooperative, oriented and sedated Pain management: pain level controlled Vital Signs Assessment: post-procedure vital signs reviewed and stable Respiratory status: spontaneous breathing, nonlabored ventilation, respiratory function stable and patient connected to nasal cannula oxygen Cardiovascular status: blood pressure returned to baseline and stable Postop Assessment: no apparent nausea or vomiting Anesthetic complications: no   No complications documented.  Last Vitals:  Vitals:   06/20/20 1741 06/20/20 1756  BP: (!) 113/54 (!) 109/57  Pulse: 103 92  Resp: 12 12  Temp:  (!) 36.1 C  SpO2: 100% 100%    Last Pain:  Vitals:   06/20/20 1741  TempSrc:   PainSc: Asleep                 Anayah Arvanitis,E. Mystery Schrupp

## 2020-06-21 ENCOUNTER — Encounter (HOSPITAL_COMMUNITY): Payer: Self-pay | Admitting: Vascular Surgery

## 2020-06-21 LAB — POCT I-STAT, CHEM 8
BUN: 44 mg/dL — ABNORMAL HIGH (ref 8–23)
Calcium, Ion: 1.12 mmol/L — ABNORMAL LOW (ref 1.15–1.40)
Chloride: 106 mmol/L (ref 98–111)
Creatinine, Ser: 1.8 mg/dL — ABNORMAL HIGH (ref 0.44–1.00)
Glucose, Bld: 263 mg/dL — ABNORMAL HIGH (ref 70–99)
HCT: 38 % (ref 36.0–46.0)
Hemoglobin: 12.9 g/dL (ref 12.0–15.0)
Potassium: 5.2 mmol/L — ABNORMAL HIGH (ref 3.5–5.1)
Sodium: 138 mmol/L (ref 135–145)
TCO2: 20 mmol/L — ABNORMAL LOW (ref 22–32)

## 2020-06-21 LAB — POCT I-STAT 7, (LYTES, BLD GAS, ICA,H+H)
Acid-base deficit: 6 mmol/L — ABNORMAL HIGH (ref 0.0–2.0)
Bicarbonate: 21.2 mmol/L (ref 20.0–28.0)
Calcium, Ion: 1.1 mmol/L — ABNORMAL LOW (ref 1.15–1.40)
HCT: 35 % — ABNORMAL LOW (ref 36.0–46.0)
Hemoglobin: 11.9 g/dL — ABNORMAL LOW (ref 12.0–15.0)
O2 Saturation: 99 %
Potassium: 5.3 mmol/L — ABNORMAL HIGH (ref 3.5–5.1)
Sodium: 139 mmol/L (ref 135–145)
TCO2: 23 mmol/L (ref 22–32)
pCO2 arterial: 47.7 mmHg (ref 32.0–48.0)
pH, Arterial: 7.257 — ABNORMAL LOW (ref 7.350–7.450)
pO2, Arterial: 186 mmHg — ABNORMAL HIGH (ref 83.0–108.0)

## 2020-06-21 LAB — GLUCOSE, CAPILLARY
Glucose-Capillary: 266 mg/dL — ABNORMAL HIGH (ref 70–99)
Glucose-Capillary: 232 mg/dL — ABNORMAL HIGH (ref 70–99)
Glucose-Capillary: 225 mg/dL — ABNORMAL HIGH (ref 70–99)
Glucose-Capillary: 204 mg/dL — ABNORMAL HIGH (ref 70–99)

## 2020-06-21 LAB — CBC
HCT: 30.3 % — ABNORMAL LOW (ref 36.0–46.0)
Hemoglobin: 9.4 g/dL — ABNORMAL LOW (ref 12.0–15.0)
MCH: 27 pg (ref 26.0–34.0)
MCHC: 31 g/dL (ref 30.0–36.0)
MCV: 87.1 fL (ref 80.0–100.0)
Platelets: 386 10*3/uL (ref 150–400)
RBC: 3.48 MIL/uL — ABNORMAL LOW (ref 3.87–5.11)
RDW: 23.7 % — ABNORMAL HIGH (ref 11.5–15.5)
WBC: 21.8 10*3/uL — ABNORMAL HIGH (ref 4.0–10.5)
nRBC: 0 % (ref 0.0–0.2)

## 2020-06-21 LAB — BASIC METABOLIC PANEL
Anion gap: 11 (ref 5–15)
BUN: 47 mg/dL — ABNORMAL HIGH (ref 8–23)
CO2: 18 mmol/L — ABNORMAL LOW (ref 22–32)
Calcium: 8.1 mg/dL — ABNORMAL LOW (ref 8.9–10.3)
Chloride: 106 mmol/L (ref 98–111)
Creatinine, Ser: 2.05 mg/dL — ABNORMAL HIGH (ref 0.44–1.00)
GFR calc Af Amer: 26 mL/min — ABNORMAL LOW (ref >60)
GFR calc non Af Amer: 23 mL/min — ABNORMAL LOW (ref >60)
Glucose, Bld: 304 mg/dL — ABNORMAL HIGH (ref 70–99)
Potassium: 5.3 mmol/L — ABNORMAL HIGH (ref 3.5–5.1)
Sodium: 135 mmol/L (ref 135–145)

## 2020-06-21 LAB — LIPID PANEL
Cholesterol: 119 mg/dL (ref 0–200)
HDL: 23 mg/dL — ABNORMAL LOW (ref >40)
LDL Cholesterol: 71 mg/dL (ref 0–99)
Total CHOL/HDL Ratio: 5.2 RATIO
Triglycerides: 127 mg/dL (ref <150)
VLDL: 25 mg/dL (ref 0–40)

## 2020-06-21 LAB — POCT ACTIVATED CLOTTING TIME
Activated Clotting Time: 191 seconds
Activated Clotting Time: 208 seconds
Activated Clotting Time: 224 seconds

## 2020-06-21 MED ORDER — INSULIN GLARGINE 100 UNIT/ML ~~LOC~~ SOLN
10.0000 [IU] | Freq: Every day | SUBCUTANEOUS | Status: DC
  Administered 2020-06-21 – 2020-06-22 (×2): 10 [IU] via SUBCUTANEOUS
  Filled 2020-06-21 (×2): qty 0.1

## 2020-06-21 NOTE — Progress Notes (Signed)
Mobility Specialist - Progress Note   06/21/20 1409  Mobility  Activity Ambulated in room  Level of Assistance Modified independent, requires aide device or extra time  Assistive Device Front wheel walker  Distance Ambulated (ft) 80 ft  Mobility Response Tolerated well  Mobility performed by Mobility specialist  $Mobility charge 1 Mobility    Pre-mobility: 95 HR During mobility: 109 HR Post-mobility: 97 HR  Pt preferred to ambulate in her room rather than go in the hallway; she said she felt a bit tired from her walk earlier today.  Pricilla Handler Mobility Specialist Mobility Specialist Phone: [phone]

## 2020-06-21 NOTE — Plan of Care (Signed)
  Problem: Nutrition: Goal: Adequate nutrition will be maintained Outcome: Progressing   

## 2020-06-21 NOTE — Evaluation (Signed)
Occupational Therapy Evaluation Patient Details Name: Sara Warner MRN: [id number] DOB: [birth year] Today's Date: 06/21/2020    History of Present Illness Pt adm with critical limb ischemia of LLE and underwent lt femoral endarterectomy, lt popliteal artery endarterectomy, and lt fem-pop bypass graft on 06/20/20. PMH - afib, chf, dm, ckd,htn, PAD   Clinical Impression   PTA pt living with granddaughter with remainder of family close by. She was functioning at mod I level for BADLs and had as needed support for IADLs. At time of eval, pt presents with ability to complete sit <> stands at min A level for safety with low surfaces. She was able to navigate functional mobility to the bathroom in her room for toilet transfer. She is independent with peri hygiene at this time. She does continue to require min A for LB BADLs due to pain. At baseline pt reports having difficulty with tub shower transfer. Educated her on DME options to improve safety and independence with bathing. Anticipate pt will progress well without post acute OT follow up. OT will continue to follow per POC listed below.    Follow Up Recommendations  No OT follow up;Supervision - Intermittent    Equipment Recommendations  Tub/shower seat    Recommendations for Other Services       Precautions / Restrictions Precautions Precautions: Fall Restrictions Weight Bearing Restrictions: No      Mobility Bed Mobility               General bed mobility comments: up in chair, returned to chair  Transfers Overall transfer level: Needs assistance Equipment used: Rolling walker (2 wheeled) Transfers: Sit to/from Stand Sit to Stand: Min assist              Balance Overall balance assessment: Mild deficits observed, not formally tested                                         ADL either performed or assessed with clinical judgement   ADL Overall ADL's : Needs assistance/impaired Eating/Feeding:  Set up;Sitting   Grooming: Supervision/safety;Sitting Grooming Details (indicate cue type and reason): able to stand without seated rest breaks to perform x2 grooming tasks at sink Upper Body Bathing: Set up;Sitting   Lower Body Bathing: Minimal assistance;Sitting/lateral leans;Sit to/from stand   Upper Body Dressing : Set up;Sitting   Lower Body Dressing: Minimal assistance;Sit to/from stand;Sitting/lateral leans   Toilet Transfer: Ambulation;RW;Regular Toilet;Minimal assistance Toilet Transfer Details (indicate cue type and reason): pt able to navigate functional mobility at min guard assist with RW to bathroom in room for toilet transfer. Assist for safe descent. Pt has raised toilets at home Toileting- Water quality scientist and Hygiene: Set up;Sitting/lateral lean   Tub/ Shower Transfer: Min guard;Ambulation;Shower seat;Rolling walker   Functional mobility during ADLs: Min guard;Rolling walker       Vision Baseline Vision/History: No visual deficits       Perception     Praxis      Pertinent Vitals/Pain Pain Assessment: Faces Faces Pain Scale: Hurts little more Pain Location: left lower extremity Pain Descriptors / Indicators: Sore Pain Intervention(s): Limited activity within patient's tolerance;Monitored during session;Repositioned     Hand Dominance     Extremity/Trunk Assessment Upper Extremity Assessment Upper Extremity Assessment: Overall WFL for tasks assessed   Lower Extremity Assessment Lower Extremity Assessment: Defer to PT evaluation       Communication Communication  Communication: No difficulties   Cognition Arousal/Alertness: Awake/alert Behavior During Therapy: WFL for tasks assessed/performed Overall Cognitive Status: Within Functional Limits for tasks assessed                                     General Comments       Exercises     Shoulder Instructions      Home Living Family/patient expects to be discharged to::  Private residence Living Arrangements: Other relatives (granddaughter lives in the home with her) Available Help at Discharge: Family;Available 24 hours/day (5 daughters close by) Type of Home: House Home Access: Ramped entrance     Home Layout: One level     Bathroom Shower/Tub: Teacher, early years/pre: Handicapped height     Home Equipment: Environmental consultant - 4 wheels;Cane - single point;Transport chair;Shower seat   Additional Comments: Pt's 5 daughters can provide assist as needed.      Prior Functioning/Environment Level of Independence: Needs assistance  Gait / Transfers Assistance Needed: Modified independent without assistive device ADL's / Homemaking Assistance Needed: Daughters assist with housework, etc. Pt reports difficulty getting in/out of tub/shower.             OT Problem List: Decreased strength;Decreased knowledge of use of DME or AE;Decreased activity tolerance;Impaired balance (sitting and/or standing);Decreased knowledge of precautions      OT Treatment/Interventions: Self-care/ADL training;Therapeutic exercise;Patient/family education;Balance training;Energy conservation;Therapeutic activities;DME and/or AE instruction    OT Goals(Current goals can be found in the care plan section) Acute Rehab OT Goals Patient Stated Goal: return home and not come back OT Goal Formulation: With patient Time For Goal Achievement: 07/05/20 Potential to Achieve Goals: Good  OT Frequency: Min 2X/week   Barriers to D/C:            Co-evaluation              AM-PAC OT "6 Clicks" Daily Activity     Outcome Measure Help from another person eating meals?: A Little Help from another person taking care of personal grooming?: A Little Help from another person toileting, which includes using toliet, bedpan, or urinal?: A Little Help from another person bathing (including washing, rinsing, drying)?: A Little Help from another person to put on and taking off  regular upper body clothing?: A Little Help from another person to put on and taking off regular lower body clothing?: A Little 6 Click Score: 18   End of Session Equipment Utilized During Treatment: Gait belt;Rolling walker Nurse Communication: Mobility status  Activity Tolerance: Patient tolerated treatment well Patient left: in chair;with call bell/phone within reach  OT Visit Diagnosis: Other abnormalities of gait and mobility (R26.89);Muscle weakness (generalized) (M62.81)                Time: 3212-2482 OT Time Calculation (min): 32 min Charges:  OT General Charges $OT Visit: 1 Visit OT Evaluation $OT Eval Moderate Complexity: 1 Mod OT Treatments $Self Care/Home Management : 8-22 mins  Zenovia Jarred, MSOT, OTR/L Acute Rehabilitation Services [facility] Office Number: [phone] Pager: [phone]  Zenovia Jarred 06/21/2020, 2:07 PM

## 2020-06-21 NOTE — Progress Notes (Addendum)
Progress Note    06/21/2020 7:17 AM 1 Day Post-Op  Subjective: No complaints this morning.  States her blood sugar was over 200 and insulin sliding scale.  Mild nausea.  No vomiting no chest pain, shortness of breath.  Foley just removed has not yet spontaneously voided   Vitals:   06/21/20 0011 06/21/20 0408  BP: (!) 130/62 (!) 122/60  Pulse: 96 102  Resp: 21 15  Temp: (!) 97.5 F (36.4 C) 97.6 F (36.4 C)  SpO2: 99% 99%    Physical Exam: Cardiac: Heart rate and rhythm are regular Lungs: Lungs clear to auscultation bilaterally Incisions: Left groin and lower extremity incisions are all well approximated.  Mild ecchymosis.  No hematoma or drainage Extremities: Both feet warm and well perfused with intact motor function and sensation.  She has bilateral palpable dorsalis pedis pulses.  Small dry ischemic ulcer of left fourth toe.  No surrounding erythema Abdomen: Soft, nondistended     CBC    Component Value Date/Time   WBC 21.8 (H) 06/21/2020 0411   RBC 3.48 (L) 06/21/2020 0411   HGB 9.4 (L) 06/21/2020 0411   HGB 8.8 (L) 04/22/2020 0941   HGB 13.5 03/22/2018 0957   HGB 13.2 09/08/2017 1417   HCT 30.3 (L) 06/21/2020 0411   HCT 40.4 03/22/2018 0957   HCT 41.0 09/08/2017 1417   PLT 386 06/21/2020 0411   PLT 580 (H) 04/22/2020 0941   PLT 372 03/22/2018 0957   MCV 87.1 06/21/2020 0411   MCV 84 03/22/2018 0957   MCV 86.2 09/08/2017 1417   MCH 27.0 06/21/2020 0411   MCHC 31.0 06/21/2020 0411   RDW 23.7 (H) 06/21/2020 0411   RDW 14.7 03/22/2018 0957   RDW 15.9 (H) 09/08/2017 1417   LYMPHSABS 2.0 04/22/2020 0941   LYMPHSABS 3.6 (H) 09/08/2017 1417   MONOABS 1.0 04/22/2020 0941   MONOABS 0.7 09/08/2017 1417   EOSABS 0.4 04/22/2020 0941   EOSABS 0.2 09/08/2017 1417   BASOSABS 0.2 (H) 04/22/2020 0941   BASOSABS 0.1 09/08/2017 1417    BMET    Component Value Date/Time   NA 135 06/21/2020 0411   NA 140 03/22/2018 0957   NA 140 09/08/2017 1417   K 5.3 (H)  06/21/2020 0411   K 4.0 09/08/2017 1417   CL 106 06/21/2020 0411   CL 106 04/26/2013 1252   CO2 18 (L) 06/21/2020 0411   CO2 24 09/08/2017 1417   GLUCOSE 304 (H) 06/21/2020 0411   GLUCOSE 143 (H) 09/08/2017 1417   GLUCOSE 177 (H) 04/26/2013 1252   BUN 47 (H) 06/21/2020 0411   BUN 28 (H) 03/22/2018 0957   BUN 26.9 (H) 09/08/2017 1417   CREATININE 2.05 (H) 06/21/2020 0411   CREATININE 1.84 (H) 04/22/2020 0941   CREATININE 1.4 (H) 09/08/2017 1417   CALCIUM 8.1 (L) 06/21/2020 0411   CALCIUM 9.5 09/08/2017 1417   GFRNONAA 23 (L) 06/21/2020 0411   GFRNONAA 26 (L) 04/22/2020 0941   GFRAA 26 (L) 06/21/2020 0411   GFRAA 30 (L) 04/22/2020 0941     Intake/Output Summary (Last 24 hours) at 06/21/2020 0717 Last data filed at 06/21/2020 0624 Gross per 24 hour  Intake 3357.97 ml  Output 1405 ml  Net 1952.97 ml    HOSPITAL MEDICATIONS Scheduled Meds: . aspirin EC  81 mg Oral Daily  . atorvastatin  20 mg Oral q1800  . digoxin  0.0625 mg Oral QODAY  . docusate sodium  100 mg Oral Daily  . furosemide  40 mg Oral Daily  . heparin  5,000 Units Subcutaneous Q8H  . insulin aspart  0-15 Units Subcutaneous TID WC  . insulin aspart  0-5 Units Subcutaneous QHS  . levothyroxine  137 mcg Oral QAC breakfast  . linagliptin  5 mg Oral Daily  . loratadine  10 mg Oral Daily  . metoprolol succinate  200 mg Oral Daily  . nilotinib  300 mg Oral Q12H  . pantoprazole  40 mg Oral Daily  . spironolactone  12.5 mg Oral BID   Continuous Infusions: . sodium chloride    . sodium chloride 100 mL/hr at 06/20/20 2137  . magnesium sulfate bolus IVPB     PRN Meds:.sodium chloride, acetaminophen **OR** acetaminophen, alum & mag hydroxide-simeth, clorazepate, guaiFENesin-dextromethorphan, hydrALAZINE, labetalol, magnesium sulfate bolus IVPB, metoprolol tartrate, morphine injection, ondansetron, oxyCODONE-acetaminophen, phenol, potassium chloride, senna-docusate, zolpidem  Assessment: Left common femoral to  above-knee popliteal artery bypass with nonreversed ipsilateral great saphenous vein. -Acute blood loss anemia, asymptomatic. -Slight rise in creatinine.  Continue IV fluids and monitor  Plan: -Work on mobility today continue to monitor. BMP in am. -DVT prophylaxis: Heparin subcu   [name], PA-C [facility] [phone] 06/21/2020  7:17 AM   I have seen and evaluated the patient. I agree with the PA note as documented above.  Postop day 1 status post left common femoral endarterectomy and left above-knee popliteal artery endarterectomy with left common femoral to above-knee popliteal bypass with nonreversed ipsilateral saphenous vein.  Has a palpable dorsalis pedis pulse and also DP/PT very brisk by Doppler as well.  States her leg feels better.  Incisions look good.  Continue aspirin for DVT prophylaxis.  DC A-line.  DC Foley.  Out of bed to chair.  Hemoglobin 12.9 --> 9.4.  We will recheck again tomorrow.  [name], MD [facility] [place] Office: [phone]

## 2020-06-21 NOTE — Progress Notes (Signed)
PHARMACIST LIPID MONITORING   Nyara Capell is a 79 y.o. female admitted on 06/20/2020 s/p fem-pop.  Pharmacy has been consulted to optimize lipid-lowering therapy with the indication of secondary prevention for clinical ASCVD.  Recent Labs:  LDL 75  Hepatic function panel (last 6 months):   Lab Results  Component Value Date   AST 19 06/12/2020   ALT 23 06/12/2020   ALKPHOS 70 06/12/2020   BILITOT 0.8 06/12/2020   BILIDIR <0.1 04/03/2020   IBILI NOT CALCULATED 04/03/2020    SCr (since admission):   Serum creatinine: 2.05 mg/dL (H) 06/21/20 0411 Estimated creatinine clearance: 24.8 mL/min (A)  Current lipid-lowering therapy: None Previous lipid-lowering therapies (if applicable): Atorvastatin 20 mg po daily   Assessment:  Patient prefers no changes in lipid-lowering therapy at this time due to atorvastatin causing muscle pains   Wasn't taking atorvastatin prior to admission and states will not take at discharge  Recommendation per protocol:  Continue current lipid-lowering therapy.  Follow-up with:  Primary care provider - Cyndi Bender, PA-C  Follow-up labs after discharge:    Liver function panel and lipid panel in 8-12 weeks then annually    Tad Moore, PharmD 06/21/2020, 8:48 AM

## 2020-06-21 NOTE — Progress Notes (Signed)
Inpatient Diabetes Program Recommendations  AACE/ADA: New Consensus Statement on Inpatient Glycemic Control (2015)  Target Ranges:  Prepandial:   less than 140 mg/dL      Peak postprandial:   less than 180 mg/dL (1-2 hours)      Critically ill patients:  140 - 180 mg/dL   Lab Results  Component Value Date   GLUCAP 232 (H) 06/21/2020   HGBA1C 7.2 (H) 06/12/2020    Review of Glycemic Control Results for Sara Warner, Sara Warner (MRN [id number]) as of 06/21/2020 14:37  Ref. Range 06/20/2020 10:17 06/20/2020 16:10 06/20/2020 21:39 06/21/2020 06:11 06/21/2020 12:01  Glucose-Capillary Latest Ref Range: 70 - 99 mg/dL 201 (H) 268 (H) 293 (H) 266 (H) 232 (H)   Diabetes history:  DM 2 Outpatient Diabetes medications:  Trulicity 3.25 weekly, Glucotrol XL 10 mg bid, Januvia 50 mg daily Current orders for Inpatient glycemic control:  Novolog moderate tid with meals and HS Tradjenta 5 mg daily Inpatient Diabetes Program Recommendations:   Please consider adding Lantus 10 units daily while patient is in the hospital.   Thanks  Adah Perl, RN, BC-ADM Inpatient Diabetes Coordinator Pager [phone] (8a-5p)

## 2020-06-21 NOTE — Evaluation (Signed)
Physical Therapy Evaluation Patient Details Name: Sara Warner MRN: [id number] DOB: 05-06-1941 Today's Date: 06/21/2020   History of Present Illness  Pt adm with critical limb ischemia of LLE and underwent lt femoral endarterectomy, lt popliteal artery endarterectomy, and lt fem-pop bypass graft on 06/20/20. PMH - afib, chf, dm, ckd,htn, PAD  Clinical Impression  Pt presents to PT with slightly unsteady gait due to surgery and inactivity. Expect pt will make good progress back to baseline with mobility. Will follow acutely but doubt pt will need PT after DC.      Follow Up Recommendations No PT follow up;Supervision - Intermittent    Equipment Recommendations  None recommended by PT    Recommendations for Other Services       Precautions / Restrictions Precautions Precautions: None      Mobility  Bed Mobility Overal bed mobility: Needs Assistance Bed Mobility: Supine to Sit     Supine to sit: Min assist     General bed mobility comments: Assist to elevate trunk bring LLE off of bed and to elevate trunk into sitting  Transfers Overall transfer level: Needs assistance Equipment used: Rolling walker (2 wheeled) Transfers: Sit to/from Stand Sit to Stand: Min assist         General transfer comment: Assist to bring hips up and to steady  Ambulation/Gait Ambulation/Gait assistance: Min guard Gait Distance (Feet): 70 Feet Assistive device: Rolling walker (2 wheeled) Gait Pattern/deviations: Step-through pattern;Decreased stride length;Decreased stance time - left;Antalgic Gait velocity: decr Gait velocity interpretation: <1.31 ft/sec, indicative of household ambulator General Gait Details: Assist for safety and lines.   Stairs            Wheelchair Mobility    Modified Rankin (Stroke Patients Only)       Balance Overall balance assessment: Mild deficits observed, not formally tested                                            Pertinent Vitals/Pain Pain Assessment: Faces Faces Pain Scale: Hurts little more Pain Location: left lower extremity Pain Descriptors / Indicators: Sore Pain Intervention(s): Limited activity within patient's tolerance;Monitored during session;Repositioned    Home Living Family/patient expects to be discharged to:: Private residence Living Arrangements: Alone Available Help at Discharge: Family;Available 24 hours/day (has 5 daughters) Type of Home: House Home Access: Ramped entrance     Home Layout: One level Home Equipment: [place] - 4 wheels;Cane - single point;Transport chair;Shower seat Additional Comments: Pt's 5 daughters can provide assist as needed.    Prior Function Level of Independence: Needs assistance   Gait / Transfers Assistance Needed: Modified independent without assistive device  ADL's / Homemaking Assistance Needed: Daughters assist with housework, etc. Pt reports difficulty getting in/out of tub/shower.         Hand Dominance   Dominant Hand: Right    Extremity/Trunk Assessment   Upper Extremity Assessment Upper Extremity Assessment: Defer to OT evaluation    Lower Extremity Assessment Lower Extremity Assessment: LLE deficits/detail LLE Deficits / Details: limited by expected post op pain       Communication   Communication: No difficulties  Cognition Arousal/Alertness: Awake/alert Behavior During Therapy: WFL for tasks assessed/performed Overall Cognitive Status: Within Functional Limits for tasks assessed  General Comments General comments (skin integrity, edema, etc.): VSS on RA    Exercises     Assessment/Plan    PT Assessment Patient needs continued PT services  PT Problem List Decreased mobility;Pain       PT Treatment Interventions DME instruction;Gait training;Functional mobility training;Therapeutic activities;Therapeutic exercise;Patient/family education    PT  Goals (Current goals can be found in the Care Plan section)  Acute Rehab PT Goals Patient Stated Goal: return home and not come back PT Goal Formulation: With patient Time For Goal Achievement: 06/28/20 Potential to Achieve Goals: Good    Frequency Min 3X/week   Barriers to discharge        Co-evaluation               AM-PAC PT "6 Clicks" Mobility  Outcome Measure Help needed turning from your back to your side while in a flat bed without using bedrails?: A Little Help needed moving from lying on your back to sitting on the side of a flat bed without using bedrails?: A Little Help needed moving to and from a bed to a chair (including a wheelchair)?: A Little Help needed standing up from a chair using your arms (e.g., wheelchair or bedside chair)?: A Little Help needed to walk in hospital room?: A Little Help needed climbing 3-5 steps with a railing? : A Little 6 Click Score: 18    End of Session Equipment Utilized During Treatment: Gait belt Activity Tolerance: Patient tolerated treatment well Patient left: in chair;with call bell/phone within reach Nurse Communication: Mobility status PT Visit Diagnosis: Other abnormalities of gait and mobility (R26.89);Pain Pain - Right/Left: Left Pain - part of body: Leg    Time: 0910-0942 PT Time Calculation (min) (ACUTE ONLY): 32 min   Charges:   PT Evaluation $PT Eval Moderate Complexity: 1 Mod PT Treatments $Gait Training: 8-22 mins        [place] Pager [phone] Office [place] 06/21/2020, 9:54 AM

## 2020-06-21 NOTE — Discharge Instructions (Signed)
 [facility]  Discharge instructions  Lower Extremity Bypass Surgery  Please refer to the following instruction for your post-procedure care. Your surgeon or physician assistant will discuss any changes with you.  Activity  You are encouraged to walk as much as you can. You can slowly return to normal activities during the month after your surgery. Avoid strenuous activity and heavy lifting until your doctor tells you it's OK. Avoid activities such as vacuuming or swinging a golf club. Do not drive until your doctor give the OK and you are no longer taking prescription pain medications. It is also normal to have difficulty with sleep habits, eating and bowel movement after surgery. These will go away with time.  Bathing/Showering  Shower daily after you go home. Do not soak in a bathtub, hot tub, or swim until the incision heals completely.  Incision Care  Clean your incision with mild soap and water. Shower every day. Pat the area dry with a clean towel. You do not need a bandage unless otherwise instructed. Do not apply any ointments or creams to your incision. If you have open wounds you will be instructed how to care for them or a visiting nurse may be arranged for you. If you have staples or sutures along your incision they will be removed at your post-op appointment. You may have skin glue on your incision. Do not peel it off. It will come off on its own in about one week.  Wash the groin wound with soap and water daily and pat dry. (No tub bath-only shower)  Then put a dry gauze or washcloth in the groin to keep this area dry to help prevent wound infection.  Do this daily and as needed.  Do not use Vaseline or neosporin on your incisions.  Only use soap and water on your incisions and then protect and keep dry.  Diet  Resume your normal diet. There are no special food restrictions following this procedure. A low fat/ low cholesterol diet is  recommended for all patients with vascular disease. In order to heal from your surgery, it is CRITICAL to get adequate nutrition. Your body requires vitamins, minerals, and protein. Vegetables are the best source of vitamins and minerals. Vegetables also provide the perfect balance of protein. Processed food has little nutritional value, so try to avoid this.  Medications  Resume taking all your medications unless your doctor or physician assistant tells you not to. If your incision is causing pain, you may take over-the-counter pain relievers such as acetaminophen (Tylenol). If you were prescribed a stronger pain medication, please aware these medication can cause nausea and constipation. Prevent nausea by taking the medication with a snack or meal. Avoid constipation by drinking plenty of fluids and eating foods with high amount of fiber, such as fruits, vegetables, and grains. Take Colace 100 mg (an over-the-counter stool softener) twice a day as needed for constipation.  Do not take Tylenol if you are taking prescription pain medications.  Follow Up  Our office will schedule a follow up appointment 2-3 weeks following discharge.  Please call us immediately for any of the following conditions  Severe or worsening pain in your legs or feet while at rest or while walking Increase pain, redness, warmth, or drainage (pus) from your incision site(s) Fever of 101 degree or higher The swelling in your leg with the bypass suddenly worsens and becomes more painful than when you were in the hospital If you have   been instructed to feel your graft pulse then you should do so every day. If you can no longer feel this pulse, call the office immediately. Not all patients are given this instruction.  Leg swelling is common after leg bypass surgery.  The swelling should improve over a few months following surgery. To improve the swelling, you may elevate your legs above the level of your heart while you are  sitting or resting. Your surgeon or physician assistant may ask you to apply an ACE wrap or wear compression (TED) stockings to help to reduce swelling.  Reduce your risk of vascular disease  Stop smoking. If you would like help call [facility] at 1-800-QUIT-NOW (1-800-784-8669) or [facility] at 336-586-4000.  Manage your cholesterol Maintain a desired weight Control your diabetes weight Control your diabetes Keep your blood pressure down  If you have any questions, please call the office at [phone]  

## 2020-06-22 LAB — BASIC METABOLIC PANEL
Anion gap: 11 (ref 5–15)
BUN: 52 mg/dL — ABNORMAL HIGH (ref 8–23)
CO2: 18 mmol/L — ABNORMAL LOW (ref 22–32)
Calcium: 8.6 mg/dL — ABNORMAL LOW (ref 8.9–10.3)
Chloride: 105 mmol/L (ref 98–111)
Creatinine, Ser: 1.87 mg/dL — ABNORMAL HIGH (ref 0.44–1.00)
GFR calc Af Amer: 29 mL/min — ABNORMAL LOW (ref >60)
GFR calc non Af Amer: 25 mL/min — ABNORMAL LOW (ref >60)
Glucose, Bld: 219 mg/dL — ABNORMAL HIGH (ref 70–99)
Potassium: 5.1 mmol/L (ref 3.5–5.1)
Sodium: 134 mmol/L — ABNORMAL LOW (ref 135–145)

## 2020-06-22 LAB — CBC
HCT: 29.5 % — ABNORMAL LOW (ref 36.0–46.0)
Hemoglobin: 9.3 g/dL — ABNORMAL LOW (ref 12.0–15.0)
MCH: 28.2 pg (ref 26.0–34.0)
MCHC: 31.5 g/dL (ref 30.0–36.0)
MCV: 89.4 fL (ref 80.0–100.0)
Platelets: 385 10*3/uL (ref 150–400)
RBC: 3.3 MIL/uL — ABNORMAL LOW (ref 3.87–5.11)
RDW: 24.2 % — ABNORMAL HIGH (ref 11.5–15.5)
WBC: 19.1 10*3/uL — ABNORMAL HIGH (ref 4.0–10.5)
nRBC: 0.1 % (ref 0.0–0.2)

## 2020-06-22 LAB — GLUCOSE, CAPILLARY
Glucose-Capillary: 217 mg/dL — ABNORMAL HIGH (ref 70–99)
Glucose-Capillary: 195 mg/dL — ABNORMAL HIGH (ref 70–99)

## 2020-06-22 MED ORDER — OXYCODONE HCL 5 MG PO TABS: 5.0000 mg | ORAL_TABLET | Freq: Three times a day (TID) | ORAL | 0 refills | Status: AC | PRN

## 2020-06-22 NOTE — Plan of Care (Signed)
  Problem: Activity: Goal: Risk for activity intolerance will decrease Outcome: Progressing   

## 2020-06-22 NOTE — Progress Notes (Signed)
Occupational Therapy Treatment Patient Details Name: Sara Warner MRN: [id number] DOB: 05-Nov-1941 Today's Date: 06/22/2020    History of present illness Pt adm with critical limb ischemia of LLE and underwent lt femoral endarterectomy, lt popliteal artery endarterectomy, and lt fem-pop bypass graft on 06/20/20. PMH - afib, chf, dm, ckd,htn, PAD   OT comments  Patient continues to make steady progress towards goals in skilled OT session. Patient's session encompassed ADLs and functional mobility in order to increase overall activity tolerance for functional independence. Pt able to demonstrate ambulation to the bathroom with min A, and able to use grab bars without cues. Provided education with regard to long handled sponge and sock aid, to which pt stated she had a sock aid at home and her daughter will help her with what she needs. Discharge remains appropriate at this time; will continue to follow acutely.    Follow Up Recommendations  No OT follow up;Supervision - Intermittent    Equipment Recommendations  Tub/shower seat    Recommendations for Other Services      Precautions / Restrictions Precautions Precautions: Fall Restrictions Weight Bearing Restrictions: No       Mobility Bed Mobility               General bed mobility comments: up in chair, returned to chair  Transfers Overall transfer level: Needs assistance Equipment used: Rolling walker (2 wheeled) Transfers: Sit to/from Stand Sit to Stand: Min assist         General transfer comment: Assist to bring hips up and to steady    Balance Overall balance assessment: Mild deficits observed, not formally tested                                         ADL either performed or assessed with clinical judgement   ADL Overall ADL's : Needs assistance/impaired                         Toilet Transfer: Ambulation;RW;Regular Toilet;Minimal assistance Toilet Transfer Details  (indicate cue type and reason): pt able to navigate functional mobility at min guard assist with RW to bathroom in room for toilet transfer. Assist for safe descent. Pt has raised toilets at home         Functional mobility during ADLs: Min guard;Rolling walker General ADL Comments: Pt conitnues to progress and demonstrate safe techniques when completing ADLs     Vision       Perception     Praxis      Cognition Arousal/Alertness: Awake/alert Behavior During Therapy: WFL for tasks assessed/performed Overall Cognitive Status: Within Functional Limits for tasks assessed                                          Exercises     Shoulder Instructions       General Comments      Pertinent Vitals/ Pain       Pain Assessment: Faces Faces Pain Scale: Hurts little more Pain Location: left lower extremity Pain Descriptors / Indicators: Sore Pain Intervention(s): Limited activity within patient's tolerance;Monitored during session;Repositioned  Home Living  Prior Functioning/Environment              Frequency  Min 2X/week        Progress Toward Goals  OT Goals(current goals can now be found in the care plan section)  Progress towards OT goals: Progressing toward goals  Acute Rehab OT Goals Patient Stated Goal: To go home OT Goal Formulation: With patient Time For Goal Achievement: 07/05/20 Potential to Achieve Goals: Good  Plan Discharge plan remains appropriate    Co-evaluation                 AM-PAC OT "6 Clicks" Daily Activity     Outcome Measure   Help from another person eating meals?: None Help from another person taking care of personal grooming?: None Help from another person toileting, which includes using toliet, bedpan, or urinal?: A Little Help from another person bathing (including washing, rinsing, drying)?: A Little Help from another person to put on and  taking off regular upper body clothing?: A Little Help from another person to put on and taking off regular lower body clothing?: A Little 6 Click Score: 20    End of Session Equipment Utilized During Treatment: Rolling walker  OT Visit Diagnosis: Other abnormalities of gait and mobility (R26.89);Muscle weakness (generalized) (M62.81)   Activity Tolerance Patient tolerated treatment well   Patient Left in chair;with call bell/phone within reach   Nurse Communication Mobility status        Time: 1003-1017 OT Time Calculation (min): 14 min  Charges: OT General Charges $OT Visit: 1 Visit OT Treatments $Self Care/Home Management : 8-22 mins   Corinne Ports E. Adrieanna Boteler, COTA/L Acute Rehabilitation Services [place] 06/22/2020, 1:00 PM

## 2020-06-22 NOTE — Progress Notes (Signed)
Pt discharged today to home with family.  Pt's IV's removed.  Pt taken off telemetry and CCMD notified.  Pt left with all of her personal belongings.  AVS documentation reviewed with Pt and all questions answered.

## 2020-06-22 NOTE — Discharge Summary (Signed)
Discharge Summary     Sara Warner 1941/08/19 79 y.o. female  [id number]  Admission Date: 06/20/2020  Discharge Date: 06/22/20 Physician: [name], MD  Admission Diagnosis: Peripheral arterial disease [facility]) [I73.9]  HPI:   This is a 79 y.o. female who presents with critical limb ischemia of the left lower extremity with tissue loss (toe ulcer).  She underwent arteriogram that showed high-grade left common femoral artery stenosis with a flush SFA occlusion and only profunda runoff and she did reconstitute an above knee popliteal artery target.  Hospital Course:  The patient was admitted to the hospital and taken to the operating room on 06/20/2020 and underwent:  Procedure: 1.  Left common femoral endarterectomy with profundoplasty and bovine pericardial patch angioplasty 2.  Left above-knee popliteal artery endarterectomy 3.  Harvest of left great saphenous vein 4.  Left common femoral to above-knee popliteal artery bypass with nonreversed ipsilateral great saphenous vein  Findings: Left common femoral artery was exposed through transverse incision in the left groin.  Ultimately saphenous vein was of adequate size from the saphenofemoral junction down to about the knee joint and then became relatively small for conduit.  Left common femoral endarterectomy was then performed for a large 80% stenosis from calcified plaque this was carried down to the profunda.  Bovine pericardial patch was sewn in the left common femoral artery.  We then sewed a nonreversed vein bypass from the common femoral tunneled subfascial and subsartorial down to the above-knee popliteal artery.  The left above knee popliteal artery did require endarterectomy.  She had brisk dorsalis pedis and posterior signals at completion.  The pt tolerated the procedure well and was transported to the PACU in excellent condition.   By POD 1, her vital signs were stable and she was afebrile.  Her pain was  controlled.  She had a palpable right dorsalis pedis pulse.  She was ambulating independently with a rolling walker.  She had no chest pain, shortness of breath, nausea or vomiting.  She was voiding spontaneously.  Blood loss anemia noted and follow-up hemoglobin stable.  She has a history of chronic kidney disease and her creatinine was followed. It rose less than 0.5 and returned to baseline on POD 1  The remainder of the hospital course consisted of increasing mobilization and increasing intake of solids without difficulty.  CBC    Component Value Date/Time   WBC 19.1 (H) 06/22/2020 0917   RBC 3.30 (L) 06/22/2020 0917   HGB 9.3 (L) 06/22/2020 0917   HGB 8.8 (L) 04/22/2020 0941   HGB 13.5 03/22/2018 0957   HGB 13.2 09/08/2017 1417   HCT 29.5 (L) 06/22/2020 0917   HCT 40.4 03/22/2018 0957   HCT 41.0 09/08/2017 1417   PLT 385 06/22/2020 0917   PLT 580 (H) 04/22/2020 0941   PLT 372 03/22/2018 0957   MCV 89.4 06/22/2020 0917   MCV 84 03/22/2018 0957   MCV 86.2 09/08/2017 1417   MCH 28.2 06/22/2020 0917   MCHC 31.5 06/22/2020 0917   RDW 24.2 (H) 06/22/2020 0917   RDW 14.7 03/22/2018 0957   RDW 15.9 (H) 09/08/2017 1417   LYMPHSABS 2.0 04/22/2020 0941   LYMPHSABS 3.6 (H) 09/08/2017 1417   MONOABS 1.0 04/22/2020 0941   MONOABS 0.7 09/08/2017 1417   EOSABS 0.4 04/22/2020 0941   EOSABS 0.2 09/08/2017 1417   BASOSABS 0.2 (H) 04/22/2020 0941   BASOSABS 0.1 09/08/2017 1417    BMET    Component Value Date/Time   NA  134 (L) 06/22/2020 1143   NA 140 03/22/2018 0957   NA 140 09/08/2017 1417   K 5.1 06/22/2020 1143   K 4.0 09/08/2017 1417   CL 105 06/22/2020 1143   CL 106 04/26/2013 1252   CO2 18 (L) 06/22/2020 1143   CO2 24 09/08/2017 1417   GLUCOSE 219 (H) 06/22/2020 1143   GLUCOSE 143 (H) 09/08/2017 1417   GLUCOSE 177 (H) 04/26/2013 1252   BUN 52 (H) 06/22/2020 1143   BUN 28 (H) 03/22/2018 0957   BUN 26.9 (H) 09/08/2017 1417   CREATININE 1.87 (H) 06/22/2020 1143    CREATININE 1.84 (H) 04/22/2020 0941   CREATININE 1.4 (H) 09/08/2017 1417   CALCIUM 8.6 (L) 06/22/2020 1143   CALCIUM 9.5 09/08/2017 1417   GFRNONAA 25 (L) 06/22/2020 1143   GFRNONAA 26 (L) 04/22/2020 0941   GFRAA 29 (L) 06/22/2020 1143   GFRAA 30 (L) 04/22/2020 0941     Discharge Instructions    Discharge patient   Complete by: As directed    Discharge disposition: 01-Home or Self Care   Discharge patient date: 06/22/2020      Discharge Diagnosis:  Peripheral arterial disease ([place]) [I73.9]  Secondary Diagnosis: Patient Active Problem List   Diagnosis Date Noted   Peripheral arterial disease ([place]) 06/20/2020   Iron deficiency anemia 04/22/2020   Epistaxis    Occult blood in stools    Antiplatelet or antithrombotic long-term use    Symptomatic anemia 04/03/2020   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   PAD (peripheral artery disease) ([place]) 12/15/2019   Critical lower limb ischemia 11/28/2019   Bilateral impacted cerumen 06/23/2017   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 Refuses [place].  CHA2DS2VASc = 4.   Chronic systolic CHF (congestive heart failure) ([place])    a. 05/2015 Echo: EF 35-40%.   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 06/2013 MV: low risk study w/o ischemia;  b. 05/2015 Echo: EF 35-40%, diff HK, basal-midinferoseptal and basal-midanteroseptal AK. Mild-mod MR, mod dil LA, mild to mod TR, PASP 52mmHg.   PAD (peripheral artery disease) ([place])    Pneumonia    30 yrs. ago     Allergies as of 06/22/2020      Reactions   Lisinopril Cough   Losartan Other (See Comments)   Patient reports nightmares/vivid dreams when taking.      Medication List    STOP taking these medications   clopidogrel 75 MG tablet Commonly known as: Plavix     TAKE these medications   aspirin EC 81 MG tablet Take 1 tablet (81 mg total) by mouth daily.  atorvastatin 20 MG tablet Commonly known as: LIPITOR TAKE 1 TABLET (20 MG TOTAL) BY MOUTH DAILY AT 6 PM.   cetirizine 10 MG tablet Commonly known as: ZYRTEC Take 10 mg by mouth daily.   clorazepate 7.5 MG tablet Commonly known as: Tranxene-T Take 1 tablet (7.5 mg total) by mouth 2 (two) times daily as needed for anxiety. What changed: when to take this   digoxin 0.125 MG tablet Commonly known as: LANOXIN Take 0.5 tablets (0.0625 mg total) by mouth every other day.   furosemide 20 MG tablet Commonly known as: LASIX TAKE 2 TABLETS BY MOUTH IN THE MORNING AND AN EXTRA TABLET IF GAIN 3 OR MORE POUNDS IN 1 DAY What changed: See the new instructions.   glipiZIDE 10 MG 24 hr tablet Commonly known as: GLUCOTROL XL Take 10 mg by mouth 2 (two) times daily.   Januvia 50 MG tablet Generic drug: sitaGLIPtin Take 50 mg by mouth daily.   metoprolol 200 MG 24 hr tablet Commonly known as: TOPROL-XL TAKE 1 TABLET BY MOUTH DAILY.   nilotinib 150 MG capsule Commonly known as: Tasigna Take 2 capsules (300 mg total) by mouth every 12 (twelve) hours. Take on an empty stomach, 1 hour before or 2 hours after meals.   oxyCODONE 5 MG immediate release  tablet Commonly known as: Roxicodone Take 1 tablet (5 mg total) by mouth every 8 (eight) hours as needed.   oxyCODONE-acetaminophen 5-325 MG tablet Commonly known as: Percocet Take 1 tablet by mouth every 6 (six) hours as needed.   pantoprazole 40 MG tablet Commonly known as: Protonix Take 1 tablet (40 mg total) by mouth daily.   polyethylene glycol 17 g packet Commonly known as: MIRALAX / GLYCOLAX Take 17 g by mouth daily as needed for moderate constipation.   spironolactone 25 MG tablet Commonly known as: ALDACTONE Take 0.5 tablets (12.5 mg total) by mouth 2 (two) times daily. What changed: how much to take   Synthroid 137 MCG tablet Generic drug: levothyroxine Take 137 mcg by mouth daily before breakfast.   Trulicity 6.06 TK/1.6WF Sopn Generic drug: Dulaglutide Inject 0.75 mg into the skin once a week. Take on Fridays       Discharge Instructions: [facility] Discharge instructions Lower Extremity Bypass Surgery  Please refer to the following instruction for your post-procedure care. Your surgeon or physician assistant will discuss any changes with you.  Activity  You are encouraged to walk as much as you can. You can slowly return to normal activities during the month after your surgery. Avoid strenuous activity and heavy lifting until your doctor tells you it's OK. Avoid activities such as vacuuming or swinging a golf club. Do not drive until your doctor give the OK and you are no longer taking prescription pain medications. It is also normal to have difficulty with sleep habits, eating and bowel movement after surgery. These will go away with time.  Bathing/Showering  You may shower after you go home. Do not soak in a bathtub, hot tub, or swim until the incision heals completely.  Incision Care  Clean your incision with mild soap and water. Shower every day. Pat the area dry with a clean towel. You do not need a bandage unless  otherwise instructed. Do not apply any ointments or creams to your incision. If you have open wounds you will be instructed how to care for them or a visiting nurse may be arranged for you. If you have staples or  sutures along your incision they will be removed at your post-op appointment. You may have skin glue on your incision. Do not peel it off. It will come off on its own in about one week.  Wash the groin wound with soap and water daily and pat dry. (No tub bath-only shower)  Then put a dry gauze or washcloth in the groin to keep this area dry to help prevent wound infection.  Do this daily and as needed.  Do not use Vaseline or neosporin on your incisions.  Only use soap and water on your incisions and then protect and keep dry.  Diet  Resume your normal diet. There are no special food restrictions following this procedure. A low fat/ low cholesterol diet is recommended for all patients with vascular disease. In order to heal from your surgery, it is CRITICAL to get adequate nutrition. Your body requires vitamins, minerals, and protein. Vegetables are the best source of vitamins and minerals. Vegetables also provide the perfect balance of protein. Processed food has little nutritional value, so try to avoid this.  Medications  Resume taking all your medications unless your doctor or Physician Assistant tells you not to. If your incision is causing pain, you may take over-the-counter pain relievers such as acetaminophen (Tylenol). If you were prescribed a stronger pain medication, please aware these medication can cause nausea and constipation. Prevent nausea by taking the medication with a snack or meal. Avoid constipation by drinking plenty of fluids and eating foods with high amount of fiber, such as fruits, vegetables, and grains. Take Colace 100 mg (an over-the-counter stool softener) twice a day as needed for constipation.  Do not take Tylenol if you are taking prescription pain  medications.  Follow Up  Our office will schedule a follow up appointment 2-3 weeks following discharge.  Please call us immediately for any of the following conditions  Severe or worsening pain in your legs or feet while at rest or while walking Increase pain, redness, warmth, or drainage (pus) from your incision site(s)  Fever of 101 degree or higher  The swelling in your leg with the bypass suddenly worsens and becomes more painful than when you were in the hospital  If you have been instructed to feel your graft pulse then you should do so every day. If you can no longer feel this pulse, call the office immediately. Not all patients are given this instruction.   Leg swelling is common after leg bypass surgery.  The swelling should improve over a few months following surgery. To improve the swelling, you may elevate your legs above the level of your heart while you are sitting or resting. Your surgeon or physician assistant may ask you to apply an ACE wrap or wear compression (TED) stockings to help to reduce swelling.  Reduce your risk of vascular disease  Stop smoking. If you would like help call [facility] at 1-800-QUIT-NOW 2028621555) or [place] at [phone].   Manage your cholesterol  Maintain a desired weight  Control your diabetes weight  Control your diabetes  Keep your blood pressure down   If you have any questions, please call the office at [phone]   Prescriptions given: 1.  Roxicet #20 No Refill   Disposition: Home  Patient's condition: is Excellent  Follow up: 1. Dr. Carlis Abbott in 2 weeks   [name], PA-C [facility] [phone] 06/22/2020  12:55 PM  - For VQI Registry use ---   Post-op:  Wound infection: No  Graft infection: No  Transfusion: No    If yes, 0 units given New Arrhythmia: No Ipsilateral amputation: No, [ ]  Minor, [ ]  BKA, [ ]  AKA Discharge patency: [x ] Primary, [ ]  Primary assisted, [  ] Secondary, [ ]  Occluded Patency judged by: [x ] Dopper only, [ ]  Palpable graft pulse, [x]  Palpable distal pulse, [ ]  ABI inc. > 0.15, [ ]  Duplex Discharge ABI: R , L  D/C Ambulatory Status: Ambulatory with Assistance  Complications: MI: No, [ ]  Troponin only, [ ]  EKG or Clinical CHF: No Resp failure:No, [ ]  Pneumonia, [ ]  Ventilator Chg in renal function: No, [ ]  Inc. Cr > 0.5, [ ]  Temp. Dialysis,  [ ]  Permanent dialysis Stroke: No, [ ]  Minor, [ ]  Major Return to OR: No  Reason for return to OR: [ ]  Bleeding, [ ]  Infection, [ ]  Thrombosis, [ ]  Revision  Discharge medications: Statin use:  no ASA use:  yes Plavix use:  no Beta blocker use: yes CCB use:  No ACEI use:   no ARB use:  no Coumadin use: no Plavix discontinued secondary to intolerance

## 2020-06-22 NOTE — Progress Notes (Signed)
Mobility Specialist: Progress Note    06/22/20 1304  Mobility  Activity Ambulated in hall  Level of Assistance Modified independent, requires aide device or extra time  Assistive Device Front wheel walker  Distance Ambulated (ft) 170 ft  Mobility Response Tolerated well  Mobility performed by Mobility specialist  Bed Position Chair  $Mobility charge 1 Mobility   Pre-Mobility: 98 HR, 128/66 BP, 100% SpO2 Post-Mobility: 117 HR, 145/63 BP, 100% SpO2  Pt c/o of pain in L leg she rated a 7/10.   [facility] Sara Warner Mobility Specialist

## 2020-06-22 NOTE — Progress Notes (Addendum)
Progress Note    06/22/2020 7:49 AM 2 Days Post-Op  Subjective: Out of bed ambulating to bathroom via rolling walker.  She states blood was not drawn this morning.   Vitals:   06/21/20 2333 06/22/20 0341  BP: (!) 115/57 (!) 116/60  Pulse: 86 89  Resp: 16 18  Temp: 97.9 F (36.6 C) 98.2 F (36.8 C)  SpO2: 99% 98%    Physical Exam: Cardiac:  RRR Lungs:  CTAB Incisions:  Left groin, thigh and lower leg incisions all well approximated without hematoma. Extremities:  Ambulating with RW well. 1+ DP pulse on left. Toe ulcer dry and stable. Abdomen:  Soft, ND  CBC    Component Value Date/Time   WBC 21.8 (H) 06/21/2020 0411   RBC 3.48 (L) 06/21/2020 0411   HGB 9.4 (L) 06/21/2020 0411   HGB 8.8 (L) 04/22/2020 0941   HGB 13.5 03/22/2018 0957   HGB 13.2 09/08/2017 1417   HCT 30.3 (L) 06/21/2020 0411   HCT 40.4 03/22/2018 0957   HCT 41.0 09/08/2017 1417   PLT 386 06/21/2020 0411   PLT 580 (H) 04/22/2020 0941   PLT 372 03/22/2018 0957   MCV 87.1 06/21/2020 0411   MCV 84 03/22/2018 0957   MCV 86.2 09/08/2017 1417   MCH 27.0 06/21/2020 0411   MCHC 31.0 06/21/2020 0411   RDW 23.7 (H) 06/21/2020 0411   RDW 14.7 03/22/2018 0957   RDW 15.9 (H) 09/08/2017 1417   LYMPHSABS 2.0 04/22/2020 0941   LYMPHSABS 3.6 (H) 09/08/2017 1417   MONOABS 1.0 04/22/2020 0941   MONOABS 0.7 09/08/2017 1417   EOSABS 0.4 04/22/2020 0941   EOSABS 0.2 09/08/2017 1417   BASOSABS 0.2 (H) 04/22/2020 0941   BASOSABS 0.1 09/08/2017 1417    BMET    Component Value Date/Time   NA 135 06/21/2020 0411   NA 140 03/22/2018 0957   NA 140 09/08/2017 1417   K 5.3 (H) 06/21/2020 0411   K 4.0 09/08/2017 1417   CL 106 06/21/2020 0411   CL 106 04/26/2013 1252   CO2 18 (L) 06/21/2020 0411   CO2 24 09/08/2017 1417   GLUCOSE 304 (H) 06/21/2020 0411   GLUCOSE 143 (H) 09/08/2017 1417   GLUCOSE 177 (H) 04/26/2013 1252   BUN 47 (H) 06/21/2020 0411   BUN 28 (H) 03/22/2018 0957   BUN 26.9 (H) 09/08/2017 1417     CREATININE 2.05 (H) 06/21/2020 0411   CREATININE 1.84 (H) 04/22/2020 0941   CREATININE 1.4 (H) 09/08/2017 1417   CALCIUM 8.1 (L) 06/21/2020 0411   CALCIUM 9.5 09/08/2017 1417   GFRNONAA 23 (L) 06/21/2020 0411   GFRNONAA 26 (L) 04/22/2020 0941   GFRAA 26 (L) 06/21/2020 0411   GFRAA 30 (L) 04/22/2020 0941     Intake/Output Summary (Last 24 hours) at 06/22/2020 0749 Last data filed at 06/22/2020 0005 Gross per 24 hour  Intake 2195.61 ml  Output --  Net 2195.61 ml    HOSPITAL MEDICATIONS Scheduled Meds: . aspirin EC  81 mg Oral Daily  . atorvastatin  20 mg Oral q1800  . digoxin  0.0625 mg Oral QODAY  . docusate sodium  100 mg Oral Daily  . furosemide  40 mg Oral Daily  . heparin  5,000 Units Subcutaneous Q8H  . insulin aspart  0-15 Units Subcutaneous TID WC  . insulin aspart  0-5 Units Subcutaneous QHS  . insulin glargine  10 Units Subcutaneous Daily  . levothyroxine  137 mcg Oral QAC breakfast  . linagliptin  5 mg Oral Daily  . loratadine  10 mg Oral Daily  . metoprolol succinate  200 mg Oral Daily  . nilotinib  300 mg Oral Q12H  . pantoprazole  40 mg Oral Daily  . spironolactone  12.5 mg Oral BID   Continuous Infusions: . sodium chloride    . sodium chloride 75 mL/hr at 06/21/20 2338  . magnesium sulfate bolus IVPB     PRN Meds:.sodium chloride, acetaminophen **OR** acetaminophen, alum & mag hydroxide-simeth, clorazepate, guaiFENesin-dextromethorphan, hydrALAZINE, labetalol, magnesium sulfate bolus IVPB, metoprolol tartrate, morphine injection, ondansetron, oxyCODONE-acetaminophen, phenol, senna-docusate, zolpidem  Assessment: Postop day 2 status post left common femoral endarterectomy and left above-knee popliteal artery endarterectomy with left common femoral to above-knee popliteal bypass with nonreversed ipsilateral saphenous vein. VSS. Pain controlled. Ambulating independently with RW.  Slight rise in creatinine post-op. IVFs were continued.  Voiding well, however  urine output not charted. BMP pending  Acute blood loss anemia.  Asymptomatic. CBC pending  Plan: -BMP, CBC reordered. -DVT prophylaxis: Heparin subcutaneously   [name], PA-C [facility] [phone] 06/22/2020  7:49 AM   I have seen and evaluated the patient. I agree with the PA note as documented above.  79 year old female now postop day 2 status post left femoral endarterectomy with left femoral to above-knee pop bypass including endarterectomy of the above-knee popliteal artery.  Her DP is palpable in the left foot.  All of her incisions look good.  She has walked multiple times and feels ready to go home and has assistance from her daughters.  Hemoglobin stable at 9.4 --> 9.3 today.  White count trending down.  Hopefully discharge home and arrange follow-up in 3 to 4 weeks for wound check.  Previously on aspirin Plavix but she stopped these given she felt it caused a rash.  I have encouraged her to please take an aspirin at discharge.  [name], MD [facility] [place] Office: [phone]

## 2020-07-16 ENCOUNTER — Ambulatory Visit (INDEPENDENT_AMBULATORY_CARE_PROVIDER_SITE_OTHER): Payer: PPO | Admitting: Vascular Surgery

## 2020-07-16 ENCOUNTER — Encounter: Payer: Self-pay | Admitting: Vascular Surgery

## 2020-07-16 ENCOUNTER — Other Ambulatory Visit: Payer: Self-pay

## 2020-07-16 VITALS — BP 151/75 | HR 81 | Temp 97.3°F | Resp 16 | Ht 68.0 in | Wt 188.0 lb

## 2020-07-16 DIAGNOSIS — I70229 Atherosclerosis of native arteries of extremities with rest pain, unspecified extremity: Secondary | ICD-10-CM

## 2020-07-16 DIAGNOSIS — I998 Other disorder of circulatory system: Secondary | ICD-10-CM

## 2020-07-16 NOTE — Progress Notes (Signed)
Patient name: Sara Warner MRN: [id number] DOB: 19-Apr-1941 Sex: female  REASON FOR VISIT: Post-op check after left fem pop bypass for CLI with tissue loss  HPI: Sara Warner is a 79 y.o. female that presents for postop check after left common femoral endarterectomy with bovine pericardial patch angioplasty and then left above-knee popliteal endarterectomy and left common femoral to above-knee popliteal bypass with nonreversed ipsilateral great saphenous vein for critical limb ischemia with tissue loss on 06/20/2020.  Overall she is doing very well.  She does have significant swelling in the left leg and she has been elevating this.  All of her incisions including the groin are healing appropriately.  Left 4th toe ulcer stable  She previously underwent right common femoral to below knee pop bypass on 12/15/2019 with great saphenous vein for critical limb ischemia with tissue loss.  She also underwent a right external iliac angioplasty with stent using several Lutonix stents on 12/06/2019 prior to her bypass to treat inflow disease for the bypass.   Past Medical History:  Diagnosis Date  . Atrial fibrillation ([place])   . Chronic atrial fibrillation ([place])    a. Refuses [place].  CHA2DS2VASc = 4.  . Chronic systolic CHF (congestive heart failure) ([place])    a. 05/2015 Echo: EF 35-40%.  . CKD (chronic kidney disease), stage III   . 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 DM (diabetes mellitus) ([place])   . Dysrhythmia   . Elevated WBC count   . Epistaxis 04/03/2020   AFTER COVID SWAB  . Hypertension   . Hypertensive heart disease   . Hypothyroidism   . NICM (nonischemic cardiomyopathy) ([place])    a. 06/2013 MV: low risk study w/o ischemia;  b. 05/2015 Echo: EF 35-40%, diff HK, basal-midinferoseptal and basal-midanteroseptal AK. Mild-mod MR, mod dil LA, mild to mod TR, PASP 19mmHg.  Marland Kitchen PAD (peripheral artery disease) ([place])   . Pneumonia    30 yrs. ago    Past Surgical History:  Procedure  Laterality Date  . ABDOMINAL AORTOGRAM W/LOWER EXTREMITY Bilateral 12/06/2019   Procedure: ABDOMINAL AORTOGRAM W/LOWER EXTREMITY;  Surgeon: [name], MD;  Location: [place] [facility];  Service: Cardiovascular;  Laterality: Bilateral;  . ABDOMINAL AORTOGRAM W/LOWER EXTREMITY Bilateral 05/15/2020   Procedure: ABDOMINAL AORTOGRAM W/LOWER EXTREMITY;  Surgeon: [name], MD;  Location: [place] [facility];  Service: Cardiovascular;  Laterality: Bilateral;  . ABDOMINAL HYSTERECTOMY    . APPLICATION OF WOUND VAC  01/10/2020   BLEEDING FROM FEMORAL BYPASS  . BIOPSY  04/08/2020   Procedure: BIOPSY;  Surgeon: Jackquline Denmark, MD;  Location: [facility] [place] ENDOSCOPY;  Service: Endoscopy;;  . CARDIOVERSION    . COLONOSCOPY WITH PROPOFOL N/A 04/08/2020   Procedure: COLONOSCOPY WITH PROPOFOL;  Surgeon: Jackquline Denmark, MD;  Location: [facility] ENDOSCOPY;  Service: Endoscopy;  Laterality: N/A;  . CYST EXCISION     removed on right ovary  . CYSTECTOMY    . ENDARTERECTOMY FEMORAL Left 06/20/2020   Procedure: LEFT COMMON FEMORAL ENDARTERECTOMY LEFT ABOVE KNEE POPLITEAL ENDARTERECTOMY;  Surgeon: [name], MD;  Location: [place];  Service: Vascular;  Laterality: Left;  . ESOPHAGOGASTRODUODENOSCOPY (EGD) WITH PROPOFOL N/A 04/08/2020   Procedure: ESOPHAGOGASTRODUODENOSCOPY (EGD) WITH PROPOFOL;  Surgeon: Jackquline Denmark, MD;  Location: [facility] ENDOSCOPY;  Service: Endoscopy;  Laterality: N/A;  . FEMORAL ARTERY - POPLITEAL ARTERY BYPASS GRAFT  12/15/2019   Right common femoral artery to below-knee popliteal artery bypass with ipsilateral nonreversed great saphenous vein  . FEMORAL-POPLITEAL BYPASS GRAFT Right  12/15/2019   Procedure: BYPASS GRAFT FEMORAL-POPLITEAL ARTERY;  Surgeon: [name], MD;  Location: [place];  Service: Vascular;  Laterality: Right;  . FEMORAL-POPLITEAL BYPASS GRAFT Left 06/20/2020   Procedure: BYPASS GRAFT FEMORAL TO ABOVE KNEE POPLITEAL ARTERY WITH HARVENSTED NON REVERSED GREATER  SAPHENOUS VEIN;  Surgeon: [name], MD;  Location: [place];  Service: Vascular;  Laterality: Left;  . HYSTEROTOMY    . PERIPHERAL VASCULAR INTERVENTION Right 12/06/2019   Procedure: PERIPHERAL VASCULAR INTERVENTION;  Surgeon: [name], MD;  Location: [place] [facility];  Service: Cardiovascular;  Laterality: Right;  EXT ILIAC  . POLYPECTOMY  04/08/2020   Procedure: POLYPECTOMY;  Surgeon: Jackquline Denmark, MD;  Location: [facility] ENDOSCOPY;  Service: Endoscopy;;  . WOUND EXPLORATION Right 01/10/2020   Procedure: right lower leg Wound Exploration with wound vac application;  Surgeon: Serafina Mitchell, MD;  Location: [facility] OR;  Service: Vascular;  Laterality: Right;    Family History  Problem Relation Age of Onset  . Cancer - Colon Mother        mast to lungs  . Diabetes Father   . Diabetes Paternal Grandmother     SOCIAL HISTORY: Social History   Tobacco Use  . Smoking status: Former Smoker    Types: Cigarettes    Quit date: 01/01/1984    Years since quitting: 36.5  . Smokeless tobacco: Never Used  Substance Use Topics  . Alcohol use: No    Allergies  Allergen Reactions  . Lisinopril Cough  . Losartan Other (See Comments)    Patient reports nightmares/vivid dreams when taking.    Current Outpatient Medications  Medication Sig Dispense Refill  . aspirin EC 81 MG tablet Take 1 tablet (81 mg total) by mouth daily.    Marland Kitchen atorvastatin (LIPITOR) 20 MG tablet TAKE 1 TABLET (20 MG TOTAL) BY MOUTH DAILY AT 6 PM. 90 tablet 1  . cetirizine (ZYRTEC) 10 MG tablet Take 10 mg by mouth daily.    . clorazepate (TRANXENE-T) 7.5 MG tablet Take 1 tablet (7.5 mg total) by mouth 2 (two) times daily as needed for anxiety. (Patient taking differently: Take 7.5 mg by mouth daily as needed for anxiety. ) 30 tablet 1  . digoxin (LANOXIN) 0.125 MG tablet Take 0.5 tablets (0.0625 mg total) by mouth every other day. 45 tablet 1  . Dulaglutide (TRULICITY) 4.65 KP/5.4SF SOPN Inject 0.75 mg into the  skin once a week. Take on Fridays    . furosemide (LASIX) 20 MG tablet TAKE 2 TABLETS BY MOUTH IN THE MORNING AND AN EXTRA TABLET IF GAIN 3 OR MORE POUNDS IN 1 DAY (Patient taking differently: Take 20-40 mg by mouth See admin instructions. Take 40 mg in the morning, may take a 20 mg dose as needed for weight gain of 3 or more pounds in 1 day) 270 tablet 1  . glipiZIDE (GLUCOTROL XL) 10 MG 24 hr tablet Take 10 mg by mouth 2 (two) times daily.     . metoprolol (TOPROL-XL) 200 MG 24 hr tablet TAKE 1 TABLET BY MOUTH DAILY. (Patient taking differently: Take 200 mg by mouth daily. ) 90 tablet 2  . nilotinib (TASIGNA) 150 MG capsule Take 2 capsules (300 mg total) by mouth every 12 (twelve) hours. Take on an empty stomach, 1 hour before or 2 hours after meals. 120 capsule 2  . oxyCODONE-acetaminophen (PERCOCET) 5-325 MG tablet Take 1 tablet by mouth every 6 (six) hours as needed. 20 tablet 0  . pantoprazole (PROTONIX)  40 MG tablet Take 1 tablet (40 mg total) by mouth daily. 30 tablet 1  . polyethylene glycol (MIRALAX / GLYCOLAX) 17 g packet Take 17 g by mouth daily as needed for moderate constipation.     . sitaGLIPtin (JANUVIA) 50 MG tablet Take 50 mg by mouth daily.     Marland Kitchen spironolactone (ALDACTONE) 25 MG tablet Take 0.5 tablets (12.5 mg total) by mouth 2 (two) times daily. (Patient taking differently: Take 25 mg by mouth 2 (two) times daily. ) 90 tablet 3  . SYNTHROID 137 MCG tablet Take 137 mcg by mouth daily before breakfast.    . oxyCODONE (ROXICODONE) 5 MG immediate release tablet Take 1 tablet (5 mg total) by mouth every 8 (eight) hours as needed. (Patient not taking: Reported on 07/16/2020) 20 tablet 0   No current facility-administered medications for this visit.    REVIEW OF SYSTEMS:  [X]  denotes positive finding, [ ]  denotes negative finding Cardiac  Comments:  Chest pain or chest pressure:    Shortness of breath upon exertion:    Short of breath when lying flat:    Irregular heart rhythm:          Vascular    Pain in calf, thigh, or hip brought on by ambulation:    Pain in feet at night that wakes you up from your sleep:     Blood clot in your veins:    Leg swelling:         Pulmonary    Oxygen at home:    Productive cough:     Wheezing:         Neurologic    Sudden weakness in arms or legs:     Sudden numbness in arms or legs:     Sudden onset of difficulty speaking or slurred speech:    Temporary loss of vision in one eye:     Problems with dizziness:         Gastrointestinal    Blood in stool:     Vomited blood:         Genitourinary    Burning when urinating:     Blood in urine:        Psychiatric    Major depression:         Hematologic    Bleeding problems:    Problems with blood clotting too easily:        Skin    Rashes or ulcers:        Constitutional    Fever or chills:      PHYSICAL EXAM: Vitals:   07/16/20 1131  BP: (!) 151/75  Pulse: 81  Resp: 16  Temp: (!) 97.3 F (36.3 C)  TempSrc: Temporal  SpO2: 98%  Weight: 188 lb (85.3 kg)  Height: 5\' 8"  (1.727 m)    GENERAL: The patient is a well-nourished female, in no acute distress. The vital signs are documented above. CARDIAC: There is a regular rate and rhythm.  VASCULAR:  Left horizontal groin incision well healed Left leg incisions all c/d/i Palpable femoral pulses bilaterally Right DP palpable Left DP palpable - significant edema in left leg Left 4th toe ulcer dry and stable  DATA:   None  Assessment/Plan:  79 year old female doing well after left common femoral endarterectomy with bovine patch angioplasty and left above-knee popliteal endarterectomy with a left common femoral to above-knee pop bypass with ipsilateral nonreversed saphenous vein for critical limb ischemia with tissue loss.  She has a small  ulceration on the left fourth toe and now that she has excellent inflow to the left lower extremity this should be more than adequate for healing.  All of her incisions  are healing well.  Discussed continued conservative measures with her left leg with elevation and gentle ace wraps - discussed this will continue to improve with time.  I will have her follow-up in 9 months with aortoiliac duplex, bilateral lower extremity arterial duplex and ABIs.  This will be for surveillance of her left leg bypass but also surveillance of her right iliac stent and right common femoral to below-knee popliteal bypass.  She knows to call with questions or concerns.  [name], MD [facility] Office: [phone]

## 2020-07-18 ENCOUNTER — Other Ambulatory Visit: Payer: Self-pay | Admitting: *Deleted

## 2020-07-18 DIAGNOSIS — I739 Peripheral vascular disease, unspecified: Secondary | ICD-10-CM

## 2020-07-24 ENCOUNTER — Inpatient Hospital Stay: Payer: PPO

## 2020-07-24 ENCOUNTER — Telehealth: Payer: Self-pay | Admitting: Oncology

## 2020-07-24 ENCOUNTER — Inpatient Hospital Stay: Payer: PPO | Attending: Oncology | Admitting: Oncology

## 2020-07-24 NOTE — Telephone Encounter (Signed)
Called pt per 8/25 sch msg - left message for patient to call back to reschedule.

## 2020-07-29 DIAGNOSIS — N183 Chronic kidney disease, stage 3 unspecified: Secondary | ICD-10-CM | POA: Diagnosis not present

## 2020-07-29 DIAGNOSIS — I4891 Unspecified atrial fibrillation: Secondary | ICD-10-CM | POA: Diagnosis not present

## 2020-07-29 DIAGNOSIS — D509 Iron deficiency anemia, unspecified: Secondary | ICD-10-CM | POA: Diagnosis not present

## 2020-07-29 DIAGNOSIS — I739 Peripheral vascular disease, unspecified: Secondary | ICD-10-CM | POA: Diagnosis not present

## 2020-07-29 DIAGNOSIS — E119 Type 2 diabetes mellitus without complications: Secondary | ICD-10-CM | POA: Diagnosis not present

## 2020-07-29 DIAGNOSIS — Z6828 Body mass index (BMI) 28.0-28.9, adult: Secondary | ICD-10-CM | POA: Diagnosis not present

## 2020-08-02 ENCOUNTER — Other Ambulatory Visit: Payer: Self-pay | Admitting: Cardiovascular Disease

## 2020-08-21 ENCOUNTER — Encounter: Payer: Self-pay | Admitting: Cardiovascular Disease

## 2020-08-21 ENCOUNTER — Ambulatory Visit (INDEPENDENT_AMBULATORY_CARE_PROVIDER_SITE_OTHER): Payer: PPO | Admitting: Cardiovascular Disease

## 2020-08-21 ENCOUNTER — Other Ambulatory Visit: Payer: Self-pay

## 2020-08-21 VITALS — BP 128/60 | HR 69 | Ht 68.0 in | Wt 178.2 lb

## 2020-08-21 DIAGNOSIS — C9210 Chronic myeloid leukemia, BCR/ABL-positive, not having achieved remission: Secondary | ICD-10-CM | POA: Diagnosis not present

## 2020-08-21 DIAGNOSIS — E039 Hypothyroidism, unspecified: Secondary | ICD-10-CM

## 2020-08-21 DIAGNOSIS — E119 Type 2 diabetes mellitus without complications: Secondary | ICD-10-CM | POA: Diagnosis not present

## 2020-08-21 DIAGNOSIS — E785 Hyperlipidemia, unspecified: Secondary | ICD-10-CM | POA: Diagnosis not present

## 2020-08-21 DIAGNOSIS — I739 Peripheral vascular disease, unspecified: Secondary | ICD-10-CM

## 2020-08-21 DIAGNOSIS — I482 Chronic atrial fibrillation, unspecified: Secondary | ICD-10-CM

## 2020-08-21 NOTE — Progress Notes (Signed)
Patient ID: Sara Warner, female   DOB: May 23, 1941, 79 y.o.   MRN: [id number]     HPI: [name] is a 79 year old female who presents to the office today for a 12 month followup cardiology evaluation.   [name] is a former patient of Dr. Doreatha Warner. She has a history of permanent atrial fibrillation and admits to being in atrial fibrillation for >18 years. She had problems with recurrent epistaxis in the past on Coumadin and consequently has not been on anticoagulation therapy. An echo Doppler study in 2008 showed an ejection fraction of 30-35%. A Myoview in February 2009 showed no ischemia but mild global reduction in LV function with an ejection fraction of 47%. An echo in 2010  showed low normal LV function with mild LA enlargement and mild mitral regurgitation and tricuspid regurgitation.  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 She does also have issues with thyroid abnormality. She has varicose veins and notes right lower extremity swelling.   When  I initially saw her in 2014, we discussed potential thromboembolic risk associated with atrial fibrillation. She has not been on any coagulation due to frequent nosebleeds requiring cauterization and does have CML. She did have a significant remote tobacco history of 2 packs per day for over 20 years. She had nonspecific ST changes inferolaterally on her EKG. A nuclear perfusion scan  on 07/05/2013 was not gated to her atrial fibrillation and was a low risk study demonstrating mild artifact. There was no scar or ischemia. A 2-D echo Doppler study showed normal systolic function and Doppler parameters suggested increased mean left atrial pressure. She is systolic bowing of her mitral valve leaflet without definitive prolapse with mild MR. Her left atrium was moderate to severely dilated and her right atrium was moderately dilated. There was mild pulmonary hypertension with PA pressure estimated at 34 mm.   A  follow-up echo Doppler study on  05/30/2015 revealed an ejection fraction of 35-40%.  There was diffuse hypokinesis with suggestion of akinesis involving the basal to mid inferoseptal wall.  There was aortic valve sclerosis without stenosis, and mild-to-moderate mitral and tricuspid regurgitation.  Her left atrium was moderately dilated.  There was mild pulmonary hypertension with estimated PA pressure 37 mm.  When I saw her in July 2018 she was experiencing weakness and shortness of breath.  She was often sleeping in a recliner and was waking up at least  3-4 times per night for nocturia.  She denies associated chest pressure.  She has a history of kidney stones.  She has a history of renal insufficiency.  I saw her, I added spironolactone 12.5 mg.  I also suggested a sleep study for high concern for sleep apnea.  Her shortness of breath did slightly improve with spironolactone.  She continues to be on furosemide 40 mg daily She never followed up with a sleep study.    I saw her in 2018 which time she had  experienced some shortness of breath in the morning.  She still sleeps in a recliner.  Her father passed away in 2023/11/18.  She has been on rate control for atrial fibrillation and most recently is on digoxin 0.125 mg daily and Toprol-XL 200 mg daily. Remotely, she had developed significant lower extremity edema on verapamil. She is diabetic on Glucotrol XL and Januvia.  She has a history of hypothyroidism on Synthroid 150 g.  GERD on Prilosec.  There also is a remote history of microcytic indices.  She is on the  Tasigna for her CML.  I suspected sleep apnea as a potential contributor to her poor sleep.  However she did not want to pursue a sleep study.  Since her last echo was in June 2016 which showed an EF of 35 to 40%, I recommended a follow-up echo Doppler study which was done onon February 04, 2018 and showed improvement in LV function with an EF now at 45 to 50%.  There was hypokinesis of the septum.  There was mild MR, her left atrium  was moderately dilated as was her right atrium.  PA pressure was mildly increased.    I last saw her in September 2020 and since her prior evaluation she had remained fairly stable.  She specifically denied any chest pain or shortness of breath and felt she was sleeping well.  She has permanent atrial fibrillation and remains adamant against initiating anticoagulation as result of significant bleeds in the past particularly with nosebleeds.  Since I last saw her, she has noted some mild ankle swelling since May.  She has been taking increased furosemide at 40 mg in the morning and 20 mg in the afternoon.  She has been taking spironolactone 12.5 mg twice a day.  She continues to be on digoxin 0.0 65 mg and metoprolol 200 mg daily for her rate control of her atrial fibrillation.  She is diabetic on Trulicity, Januvia and glipizide.  She continues to be on atorvastatin for hyperlipidemia at 20 mg and is on to [facility] for for CML.  In July 2021 she developed critical limb ischemia and underwent left common femoral endarterectomy with profundoplasty and bovine pericardial patch angioplasty with left above-the-knee popliteal artery endarterectomy.  She had left common femoral to above-knee popliteal artery bypass with nonreversed ipsilateral great saphenous vein performed by Dr. Monica Warner.  Her leg is now warm although there is now still some mild residual swelling.  She presents for follow-up cardiology evaluation.  Past Medical History:  Diagnosis Date  . Atrial fibrillation ([place])   . Chronic atrial fibrillation ([place])    a. Refuses [place].  CHA2DS2VASc = 4.  . Chronic systolic CHF (congestive heart failure) ([place])    a. 05/2015 Echo: EF 35-40%.  . CKD (chronic kidney disease), stage III   . CML (chronic myelocytic leukemia) ([place]) 01/10/2013  . DM (diabetes mellitus) ([place])   . Dysrhythmia   . Elevated WBC count   . Epistaxis 04/03/2020   AFTER COVID SWAB  . Hypertension   . Hypertensive heart disease    . Hypothyroidism   . NICM (nonischemic cardiomyopathy) ([place])    a. 06/2013 MV: low risk study w/o ischemia;  b. 05/2015 Echo: EF 35-40%, diff HK, basal-midinferoseptal and basal-midanteroseptal AK. Mild-mod MR, mod dil LA, mild to mod TR, PASP 1mHg.  .Marland KitchenPAD (peripheral artery disease) [place]   . Pneumonia    30 yrs. ago    Past Surgical History:  Procedure Laterality Date  . ABDOMINAL AORTOGRAM W/LOWER EXTREMITY Bilateral 12/06/2019   Procedure: ABDOMINAL AORTOGRAM W/LOWER EXTREMITY;  Surgeon: CMarty Heck MD;  [facility];  Service: Cardiovascular;  Laterality: Bilateral;  . ABDOMINAL AORTOGRAM W/LOWER EXTREMITY Bilateral 05/15/2020   Procedure: ABDOMINAL AORTOGRAM W/LOWER EXTREMITY;  Surgeon: CMarty Heck MD;  [facility];  Service: Cardiovascular;  Laterality: Bilateral;  . ABDOMINAL HYSTERECTOMY    . APPLICATION OF WOUND VAC  01/10/2020   BLEEDING FROM FEMORAL BYPASS  . BIOPSY  04/08/2020   Procedure: BIOPSY;  Surgeon:  Jackquline Denmark, MD;  Location[facility] ENDOSCOPY;  Service: Endoscopy;;  . CARDIOVERSION    . COLONOSCOPY WITH PROPOFOL N/A 04/08/2020   Procedure: COLONOSCOPY WITH PROPOFOL;  Surgeon: Jackquline Denmark, MD;  Location: [facility] ENDOSCOPY;  Service: Endoscopy;  Laterality: N/A;  . CYST EXCISION     removed on right ovary  . CYSTECTOMY    . ENDARTERECTOMY FEMORAL Left 06/20/2020   Procedure: LEFT COMMON FEMORAL ENDARTERECTOMY LEFT ABOVE KNEE POPLITEAL ENDARTERECTOMY;  Surgeon: [name], MD;  Location: [place];  Service: Vascular;  Laterality: Left;  . ESOPHAGOGASTRODUODENOSCOPY (EGD) WITH PROPOFOL N/A 04/08/2020   Procedure: ESOPHAGOGASTRODUODENOSCOPY (EGD) WITH PROPOFOL;  Surgeon: Jackquline Denmark, MD;  Location: [facility] ENDOSCOPY;  Service: Endoscopy;  Laterality: N/A;  . FEMORAL ARTERY - POPLITEAL ARTERY BYPASS GRAFT  12/15/2019   Right common femoral artery to below-knee popliteal artery bypass with ipsilateral nonreversed great  saphenous vein  . FEMORAL-POPLITEAL BYPASS GRAFT Right 12/15/2019   Procedure: BYPASS GRAFT FEMORAL-POPLITEAL ARTERY;  Surgeon: [name], MD;  Location: [place];  Service: Vascular;  Laterality: Right;  . FEMORAL-POPLITEAL BYPASS GRAFT Left 06/20/2020   Procedure: BYPASS GRAFT FEMORAL TO ABOVE KNEE POPLITEAL ARTERY WITH HARVENSTED NON REVERSED GREATER SAPHENOUS VEIN;  Surgeon: [name], MD;  Location: [place];  Service: Vascular;  Laterality: Left;  . HYSTEROTOMY    . PERIPHERAL VASCULAR INTERVENTION Right 12/06/2019   Procedure: PERIPHERAL VASCULAR INTERVENTION;  Surgeon: [name], MD;  Location: [place] [facility] LAB;  Service: Cardiovascular;  Laterality: Right;  EXT ILIAC  . POLYPECTOMY  04/08/2020   Procedure: POLYPECTOMY;  Surgeon: Jackquline Denmark, MD;  Location: [facility] ENDOSCOPY;  Service: Endoscopy;;  . WOUND EXPLORATION Right 01/10/2020   Procedure: right lower leg Wound Exploration with wound vac application;  Surgeon: Serafina Mitchell, MD;  Location: [place];  Service: Vascular;  Laterality: Right;    Allergies  Allergen Reactions  . Lisinopril Cough  . Losartan Other (See Comments)    Patient reports nightmares/vivid dreams when taking.    Current Outpatient Medications  Medication Sig Dispense Refill  . aspirin EC 81 MG tablet Take 1 tablet (81 mg total) by mouth daily.    Marland Kitchen atorvastatin (LIPITOR) 20 MG tablet TAKE 1 TABLET (20 MG TOTAL) BY MOUTH DAILY AT 6 PM. 90 tablet 1  . cetirizine (ZYRTEC) 10 MG tablet Take 10 mg by mouth daily.    . clorazepate (TRANXENE-T) 7.5 MG tablet Take 1 tablet (7.5 mg total) by mouth 2 (two) times daily as needed for anxiety. (Patient taking differently: Take 7.5 mg by mouth daily as needed for anxiety. ) 30 tablet 1  . digoxin (LANOXIN) 0.125 MG tablet Take 0.5 tablets (0.0625 mg total) by mouth every other day. 45 tablet 1  . Dulaglutide (TRULICITY) 3.90 ZE/0.9QZ SOPN Inject 0.75 mg into the skin once a week. Take on Fridays      . furosemide (LASIX) 20 MG tablet TAKE 2 TABLETS BY MOUTH IN THE MORNING AND AN EXTRA TABLET IF GAIN 3 OR MORE POUNDS IN 1 DAY (Patient taking differently: Take 20-40 mg by mouth See admin instructions. Take 40 mg in the morning, may take a 20 mg dose as needed for weight gain of 3 or more pounds in 1 day) 270 tablet 1  . glipiZIDE (GLUCOTROL XL) 10 MG 24 hr tablet Take 10 mg by mouth 2 (two) times daily.     . metoprolol (TOPROL-XL) 200 MG 24 hr tablet TAKE 1 TABLET BY MOUTH DAILY. 30 tablet  1  . nilotinib (TASIGNA) 150 MG capsule Take 2 capsules (300 mg total) by mouth every 12 (twelve) hours. Take on an empty stomach, 1 hour before or 2 hours after meals. 120 capsule 2  . oxyCODONE (ROXICODONE) 5 MG immediate release tablet Take 1 tablet (5 mg total) by mouth every 8 (eight) hours as needed. 20 tablet 0  . oxyCODONE-acetaminophen (PERCOCET) 5-325 MG tablet Take 1 tablet by mouth every 6 (six) hours as needed. 20 tablet 0  . pantoprazole (PROTONIX) 40 MG tablet Take 1 tablet (40 mg total) by mouth daily. 30 tablet 1  . polyethylene glycol (MIRALAX / GLYCOLAX) 17 g packet Take 17 g by mouth daily as needed for moderate constipation.     . sitaGLIPtin (JANUVIA) 50 MG tablet Take 50 mg by mouth daily.     Marland Kitchen spironolactone (ALDACTONE) 25 MG tablet Take 0.5 tablets (12.5 mg total) by mouth 2 (two) times daily. (Patient taking differently: Take 25 mg by mouth 2 (two) times daily. ) 90 tablet 3  . SYNTHROID 137 MCG tablet Take 137 mcg by mouth daily before breakfast.     No current facility-administered medications for this visit.    Socially she is widowed for 25 years. She has 5 children 15 grandchildren and 17 great grandchildren. She lives at home with her 2 grandchildren. She is retired. She completed ninth grade of education. She is a former smoker but quit smoking in 1987 there prior to that she had smoked up to 2 packs per day for 20 years. There is no alcohol use.  Family History  Problem  Relation Age of Onset  . Cancer - Colon Mother        mast to lungs  . Diabetes Father   . Diabetes Paternal Grandmother     ROS General: Negative; No fevers, chills, or night sweats;  HEENT: Negative; No changes in vision or hearing, sinus congestion, difficulty swallowing Pulmonary: Negative; No cough, wheezing, shortness of breath, hemoptysis Cardiovascular: Negative; No chest pain, presyncope, syncope, palpitations Recent PV surgery for critical limb ischemia of the left lower extremity with toe ulcer tissue loss GI: Negative; No nausea, vomiting, diarrhea, or abdominal pain GU: Negative; No dysuria, hematuria, or difficulty voiding Musculoskeletal: Negative; no myalgias, joint pain, or weakness Hematologic/Oncology: Positive for CML Endocrine: Positive for hypothyroidism and diabetes Neuro: Negative; no changes in balance, headaches Skin: Negative; No rashes or skin lesions Psychiatric: Negative; No behavioral problems, depression Sleep: Previously with poor sleep which seems to have improved; no daytime sleepiness, hypersomnolence, bruxism, restless legs, hypnogognic hallucinations, no cataplexy Other comprehensive 14 point system review is negative.  PE BP 128/60   Pulse 69   Ht 5' 8"  (1.727 m)   Wt 178 lb 3.2 oz (80.8 kg)   SpO2 98%   BMI 27.10 kg/m    Repeat blood pressure by me 128/64  Wt Readings from Last 3 Encounters:  08/21/20 178 lb 3.2 oz (80.8 kg)  07/16/20 188 lb (85.3 kg)  06/20/20 172 lb (78 kg)   General: Alert, oriented, no distress.  Skin: normal turgor, no rashes, warm and dry HEENT: Normocephalic, atraumatic. Pupils equal round and reactive to light; sclera anicteric; extraocular muscles intact;  Nose without nasal septal hypertrophy Mouth/Parynx benign; Mallinpatti scale 3 Neck: No JVD, no carotid bruits; normal carotid upstroke Lungs: clear to ausculatation and percussion; no wheezing or rales Chest wall: without tenderness to  palpitation Heart: PMI not displaced, RRR, s1 s2 normal, 1/6 systolic murmur, no diastolic  murmur, no rubs, gallops, thrills, or heaves Abdomen: soft, nontender; no hepatosplenomehaly, BS+; abdominal aorta nontender and not dilated by palpation. Back: no CVA tenderness Pulses 2+ Musculoskeletal: full range of motion, normal strength, no joint deformities Extremities: Mild residual swelling left lower extremity; normal temperature; no clubbing cyanosis, Homan's sign negative  Neurologic: grossly nonfocal; Cranial nerves grossly wnl Psychologic: Normal mood and affect   ECG (independently read by me): Atrial fibrillation at 69 bpm.  QTc interval 411 ms.  September 2020 ECG (independently read by me): Atrial fibrillation at 93; PVC; LVH  April 2019 ECG (independently read by me): Atrial fibrillation at 93 bpm.  Borderline LVH.  No significant ST change.  February 2019 ECG (independently read by me): Atrial fibrillation at 97 bpm.  QTc interval 408 ms.  July 2018 ECG (independently read by me): Atrial fibrillation at 83 bpm.  Nonspecific ST changes.  Normal intervals.  December 2017 ECG (independently read by me): Atrial fibrillation with ventricular rate at 90 bpm.  LVH by voltage.  Nonspecific ST changes.  February 2017 ECG (independently read by me):  Atrial fibrillation with ventricular rate at 78 bpm.  Nonspecific ST changes, probably secondary to digoxin effect. Normal QTc interval.  November 2016 ECG (independently read by me): Atrial fibrillation with a ventricular rate in the 90s.  LVH by voltage.  Inferolateral ST-T changes possibly contributed by digoxin.  August 2016 ECG (independently read by me): Atrial fibrillation with rate in the 80s.  Inferolateral ST-T changes.  QTc interval 420 ms.  June 2016 ECG (independently read by me): Atrial fibrillation with rapid ventricular response with a rate December 2017 at 1 21 bpm.  Nonspecific inferolateral ST segment  changes.  LABS:  BMP Latest Ref Rng & Units 06/22/2020 06/21/2020 06/20/2020  Glucose 70 - 99 mg/dL 219(H) 304(H) 263(H)  BUN 8 - 23 mg/dL 52(H) 47(H) 44(H)  Creatinine 0.44 - 1.00 mg/dL 1.87(H) 2.05(H) 1.80(H)  BUN/Creat Ratio 12 - 28 - - -  Sodium 135 - 145 mmol/L 134(L) 135 138  Potassium 3.5 - 5.1 mmol/L 5.1 5.3(H) 5.2(H)  Chloride 98 - 111 mmol/L 105 106 106  CO2 22 - 32 mmol/L 18(L) 18(L) -  Calcium 8.9 - 10.3 mg/dL 8.6(L) 8.1(L) -   Hepatic Function Latest Ref Rng & Units 06/12/2020 04/22/2020 04/03/2020  Total Protein 6.5 - 8.1 g/dL 7.7 6.8 6.8  Albumin 3.5 - 5.0 g/dL 4.5 3.9 3.8  AST 15 - 41 U/L 19 12(L) 14(L)  ALT 0 - 44 U/L 23 13 14   Alk Phosphatase 38 - 126 U/L 70 55 49  Total Bilirubin 0.3 - 1.2 mg/dL 0.8 0.8 0.9  Bilirubin, Direct 0.0 - 0.2 mg/dL - - <0.1   CBC Latest Ref Rng & Units 06/22/2020 06/21/2020 06/20/2020  WBC 4.0 - 10.5 K/uL 19.1(H) 21.8(H) -  Hemoglobin 12.0 - 15.0 g/dL 9.3(L) 9.4(L) 12.9  Hematocrit 36 - 46 % 29.5(L) 30.3(L) 38.0  Platelets 150 - 400 K/uL 385 386 -   Lab Results  Component Value Date   MCV 89.4 06/22/2020   MCV 87.1 06/21/2020   MCV 85.2 06/12/2020   Lab Results  Component Value Date   TSH 0.543 03/22/2018   Lab Results  Component Value Date   HGBA1C 7.2 (H) 06/12/2020     Lipid Panel     Component Value Date/Time   CHOL 119 06/21/2020 0411   CHOL 136 03/22/2018 0957   TRIG 127 06/21/2020 0411   HDL 23 (L) 06/21/2020 0411  HDL 31 (L) 03/22/2018 0957   CHOLHDL 5.2 06/21/2020 0411   VLDL 25 06/21/2020 0411   LDLCALC 71 06/21/2020 0411   LDLCALC 75 03/22/2018 0957    ------------------------------------------------------------------- ECHO 02/04/2018 Study Conclusions  - Left ventricle: The cavity size was moderately dilated. Wall   thickness was normal. Systolic function was mildly reduced. The   estimated ejection fraction was in the range of 45% to 50%. There   is severe hypokinesis of the septal myocardium. - Mitral  valve: There was mild regurgitation. - Left atrium: The atrium was moderately dilated. - Right atrium: The atrium was moderately dilated. - Pulmonary arteries: PA peak pressure: 35 mm Hg (S).  Impressions:  - Severe septal hyokinesis with overall mild LV dysfunction;   moderate LVE; mild MR; moderate biatrial enlargement; mild TR.   RADIOLOGY: No results found.   IMPRESSION:  1. PAD (peripheral artery disease) ([place])   2. Chronic atrial fibrillation ([facility])   3. Hyperlipidemia with target LDL less than 70   4. Hypothyroidism, unspecified type   5. Controlled type 2 diabetes mellitus without complication, without long-term current use of insulin ([place])   6. CML (chronic myelocytic leukemia) ([facility])     ASSESSMENT AND PLAN: [name] is a 79 year old female who has a history of permanent atrial fibrillation for >20 years. She has not been on anticoagulation due to history of frequent nosebleeds in the past requiring cauterization.  She is aware of potential stroke risk and thromboembolic events and knowing this has been adamant to not reinstitute anticoagulation therapy.  For this reason she is only on aspirin alone.  Her atrial fibrillation rate is controlled with digoxin at 20/60 5 mg daily in addition to metoprolol succinate 200 mg.  She is diabetic and recently developed progressive toe ulceration resulting from critical limb ischemia and required peripheral vascular surgery with left common femoral endarterectomy with prone fundoplasty and bovine pericardial patch angioplasty, left above-knee popliteal artery endarterectomy, and left common femoral to above-knee popliteal artery bypass with nonreversed ipsilateral great saphenous vein by Dr. Monica Warner.  Her leg pain has improved since surgery.  Her leg edema has improved with recent increase in furosemide to 40 mg in the morning and 20 mg in the afternoon if she continues to take spironolactone 12.5 mg twice a day.  She continues  to be on atorvastatin for hyperlipidemia.  LDL cholesterol in July 2021 was 71.  She has stage IV chronic kidney disease with most recent creatinine of 2.15 on August 30 followed by Cyndi Bender, PA.  She is diabetic on Trulicity, glipizide and Januvia.  Her CML is stable on to Tasigna.  She continues to be on pantoprazole for GERD.   Time spent: 25 minutes Troy Sine, MD, [facility] 08/22/2020 4:24 PM

## 2020-08-21 NOTE — Patient Instructions (Signed)

## 2020-08-22 ENCOUNTER — Encounter: Payer: Self-pay | Admitting: Cardiovascular Disease

## 2020-08-25 ENCOUNTER — Other Ambulatory Visit: Payer: Self-pay | Admitting: Cardiovascular Disease

## 2020-08-27 ENCOUNTER — Other Ambulatory Visit: Payer: Self-pay

## 2020-08-27 ENCOUNTER — Telehealth: Payer: Self-pay | Admitting: Oncology

## 2020-08-27 ENCOUNTER — Telehealth: Payer: Self-pay

## 2020-08-27 DIAGNOSIS — C9210 Chronic myeloid leukemia, BCR/ABL-positive, not having achieved remission: Secondary | ICD-10-CM

## 2020-08-27 MED ORDER — NILOTINIB HCL 150 MG PO CAPS: 300.0000 mg | ORAL_CAPSULE | Freq: Two times a day (BID) | ORAL | 0 refills | Status: DC

## 2020-08-27 NOTE — Telephone Encounter (Signed)
Scheduling message sent to schedule patient for lab and MD visit in the next 4-6 weeks. Refill sent in for Tasigna.

## 2020-08-27 NOTE — Telephone Encounter (Signed)
-----   Message from Wyatt Portela, MD sent at 08/27/2020  9:16 AM EDT ----- Ok to refill. Please send a scheduling message for lab and MD in the next 4 to 6 weeks.  Thanks ----- Message ----- From: Tami Lin, RN Sent: 08/27/2020   9:08 AM EDT To: Wyatt Portela, MD  Patient called requesting a refill on Tasigna. Her last visit with you was on 04/22/20. She was a no show for her visit 07/24/20. She does not have any upcoming appointments scheduled. Is it ok to refill? Lanelle Bal

## 2020-08-27 NOTE — Telephone Encounter (Signed)
Scheduled appt per 9/28 sch msg - pt aware of appt date and time

## 2020-08-28 DIAGNOSIS — N184 Chronic kidney disease, stage 4 (severe): Secondary | ICD-10-CM | POA: Diagnosis not present

## 2020-08-28 DIAGNOSIS — M899 Disorder of bone, unspecified: Secondary | ICD-10-CM | POA: Diagnosis not present

## 2020-08-28 DIAGNOSIS — D631 Anemia in chronic kidney disease: Secondary | ICD-10-CM | POA: Diagnosis not present

## 2020-08-28 DIAGNOSIS — I129 Hypertensive chronic kidney disease with stage 1 through stage 4 chronic kidney disease, or unspecified chronic kidney disease: Secondary | ICD-10-CM | POA: Diagnosis not present

## 2020-08-28 DIAGNOSIS — R609 Edema, unspecified: Secondary | ICD-10-CM | POA: Diagnosis not present

## 2020-09-02 ENCOUNTER — Other Ambulatory Visit: Payer: Self-pay | Admitting: Vascular Surgery

## 2020-09-02 DIAGNOSIS — I739 Peripheral vascular disease, unspecified: Secondary | ICD-10-CM

## 2020-09-05 ENCOUNTER — Other Ambulatory Visit: Payer: Self-pay | Admitting: Nephrology

## 2020-09-05 DIAGNOSIS — I129 Hypertensive chronic kidney disease with stage 1 through stage 4 chronic kidney disease, or unspecified chronic kidney disease: Secondary | ICD-10-CM

## 2020-09-05 DIAGNOSIS — N184 Chronic kidney disease, stage 4 (severe): Secondary | ICD-10-CM

## 2020-09-19 ENCOUNTER — Ambulatory Visit
Admission: RE | Admit: 2020-09-19 | Discharge: 2020-09-19 | Disposition: A | Discharge status: Home / Self Care | Payer: PPO | Source: Ambulatory Visit | Attending: Nephrology | Admitting: Nephrology

## 2020-09-19 DIAGNOSIS — I129 Hypertensive chronic kidney disease with stage 1 through stage 4 chronic kidney disease, or unspecified chronic kidney disease: Secondary | ICD-10-CM | POA: Diagnosis not present

## 2020-09-19 DIAGNOSIS — N184 Chronic kidney disease, stage 4 (severe): Secondary | ICD-10-CM

## 2020-09-21 ENCOUNTER — Other Ambulatory Visit: Payer: Self-pay | Admitting: Cardiovascular Disease

## 2020-09-24 ENCOUNTER — Other Ambulatory Visit: Payer: Self-pay | Admitting: Oncology

## 2020-09-24 DIAGNOSIS — C9210 Chronic myeloid leukemia, BCR/ABL-positive, not having achieved remission: Secondary | ICD-10-CM

## 2020-09-24 DIAGNOSIS — D509 Iron deficiency anemia, unspecified: Secondary | ICD-10-CM

## 2020-09-25 ENCOUNTER — Inpatient Hospital Stay: Payer: PPO

## 2020-09-25 ENCOUNTER — Inpatient Hospital Stay: Payer: PPO | Attending: Oncology | Admitting: Oncology

## 2020-09-25 ENCOUNTER — Other Ambulatory Visit: Payer: Self-pay

## 2020-09-25 VITALS — BP 142/72 | HR 83 | Temp 97.9°F | Resp 18 | Ht 68.0 in | Wt 177.1 lb

## 2020-09-25 DIAGNOSIS — Z7982 Long term (current) use of aspirin: Secondary | ICD-10-CM | POA: Diagnosis not present

## 2020-09-25 DIAGNOSIS — I739 Peripheral vascular disease, unspecified: Secondary | ICD-10-CM | POA: Diagnosis not present

## 2020-09-25 DIAGNOSIS — Z79899 Other long term (current) drug therapy: Secondary | ICD-10-CM | POA: Insufficient documentation

## 2020-09-25 DIAGNOSIS — D509 Iron deficiency anemia, unspecified: Secondary | ICD-10-CM | POA: Diagnosis not present

## 2020-09-25 DIAGNOSIS — Z7984 Long term (current) use of oral hypoglycemic drugs: Secondary | ICD-10-CM | POA: Insufficient documentation

## 2020-09-25 DIAGNOSIS — C9210 Chronic myeloid leukemia, BCR/ABL-positive, not having achieved remission: Secondary | ICD-10-CM

## 2020-09-25 DIAGNOSIS — C9211 Chronic myeloid leukemia, BCR/ABL-positive, in remission: Secondary | ICD-10-CM | POA: Insufficient documentation

## 2020-09-25 LAB — CBC WITH DIFFERENTIAL (CANCER CENTER ONLY)
Abs Immature Granulocytes: 0.23 10*3/uL — ABNORMAL HIGH (ref 0.00–0.07)
Basophils Absolute: 0.2 10*3/uL — ABNORMAL HIGH (ref 0.0–0.1)
Basophils Relative: 1 %
Eosinophils Absolute: 0.3 10*3/uL (ref 0.0–0.5)
Eosinophils Relative: 2 %
HCT: 38.2 % (ref 36.0–46.0)
Hemoglobin: 11.9 g/dL — ABNORMAL LOW (ref 12.0–15.0)
Immature Granulocytes: 2 %
Lymphocytes Relative: 16 %
Lymphs Abs: 2.1 10*3/uL (ref 0.7–4.0)
MCH: 27.8 pg (ref 26.0–34.0)
MCHC: 31.2 g/dL (ref 30.0–36.0)
MCV: 89.3 fL (ref 80.0–100.0)
Monocytes Absolute: 1 10*3/uL (ref 0.1–1.0)
Monocytes Relative: 7 %
Neutro Abs: 9.8 10*3/uL — ABNORMAL HIGH (ref 1.7–7.7)
Neutrophils Relative %: 72 %
Platelet Count: 456 10*3/uL — ABNORMAL HIGH (ref 150–400)
RBC: 4.28 MIL/uL (ref 3.87–5.11)
RDW: 13.7 % (ref 11.5–15.5)
WBC Count: 13.6 10*3/uL — ABNORMAL HIGH (ref 4.0–10.5)
nRBC: 0 % (ref 0.0–0.2)

## 2020-09-25 LAB — CMP (CANCER CENTER ONLY)
ALT: 15 U/L (ref 0–44)
AST: 13 U/L — ABNORMAL LOW (ref 15–41)
Albumin: 4.2 g/dL (ref 3.5–5.0)
Alkaline Phosphatase: 78 U/L (ref 38–126)
Anion gap: 10 (ref 5–15)
BUN: 35 mg/dL — ABNORMAL HIGH (ref 8–23)
CO2: 27 mmol/L (ref 22–32)
Calcium: 9.5 mg/dL (ref 8.9–10.3)
Chloride: 101 mmol/L (ref 98–111)
Creatinine: 1.93 mg/dL — ABNORMAL HIGH (ref 0.44–1.00)
GFR, Estimated: 26 mL/min — ABNORMAL LOW (ref >60)
Glucose, Bld: 166 mg/dL — ABNORMAL HIGH (ref 70–99)
Potassium: 4.5 mmol/L (ref 3.5–5.1)
Sodium: 138 mmol/L (ref 135–145)
Total Bilirubin: 1.2 mg/dL (ref 0.3–1.2)
Total Protein: 7.5 g/dL (ref 6.5–8.1)

## 2020-09-25 NOTE — Progress Notes (Signed)
Hematology and Oncology Follow Up Visit  Sara Warner [id number] [birth year] 79 y.o. 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  She presented with chronic phase at that time.  Current treatment: Tasigna 300 mg bid started 11/2012 with complete hematological and molecular remission.  Interim History: [name] is here for return evaluation.  Since last visit, she was hospitalized in July 2021 where she underwent femoral artery enterectomy performed by Dr. Carlis Abbott.  He is recovering slowly from the surgery although no recent hospitalization or illnesses.  She continues to tolerate Tasigna without any complaints.        Medications: Unchanged on review. Current Outpatient Medications  Medication Sig Dispense Refill  . metoprolol (TOPROL-XL) 200 MG 24 hr tablet TAKE 1 TABLET BY MOUTH EVERY DAY 90 tablet 3  . aspirin EC 81 MG tablet Take 1 tablet (81 mg total) by mouth daily.    Marland Kitchen atorvastatin (LIPITOR) 20 MG tablet TAKE 1 TABLET (20 MG TOTAL) BY MOUTH DAILY AT 6 PM. 90 tablet 2  . cetirizine (ZYRTEC) 10 MG tablet Take 10 mg by mouth daily.    . clorazepate (TRANXENE-T) 7.5 MG tablet Take 1 tablet (7.5 mg total) by mouth 2 (two) times daily as needed for anxiety. (Patient taking differently: Take 7.5 mg by mouth daily as needed for anxiety. ) 30 tablet 1  . digoxin (LANOXIN) 0.125 MG tablet Take 0.5 tablets (0.0625 mg total) by mouth every other day. 45 tablet 1  . Dulaglutide (TRULICITY) 5.64 PP/2.9JJ SOPN Inject 0.75 mg into the skin once a week. Take on Fridays    . furosemide (LASIX) 20 MG tablet TAKE 2 TABLETS BY MOUTH IN THE MORNING AND AN EXTRA TABLET IF GAIN 3 OR MORE POUNDS IN 1 DAY (Patient taking differently: Take 20-40 mg by mouth See admin instructions. Take 40 mg in the morning, may take a 20 mg dose as needed for weight gain of 3 or more pounds in 1 day) 270 tablet 1  . glipiZIDE (GLUCOTROL XL) 10 MG 24 hr tablet Take 10 mg by  mouth 2 (two) times daily.     . nilotinib (TASIGNA) 150 MG capsule Take 2 capsules (300 mg total) by mouth every 12 (twelve) hours. Take on an empty stomach, 1 hour before or 2 hours after meals. 120 capsule 0  . oxyCODONE (ROXICODONE) 5 MG immediate release tablet Take 1 tablet (5 mg total) by mouth every 8 (eight) hours as needed. 20 tablet 0  . oxyCODONE-acetaminophen (PERCOCET) 5-325 MG tablet Take 1 tablet by mouth every 6 (six) hours as needed. 20 tablet 0  . pantoprazole (PROTONIX) 40 MG tablet Take 1 tablet (40 mg total) by mouth daily. 30 tablet 1  . polyethylene glycol (MIRALAX / GLYCOLAX) 17 g packet Take 17 g by mouth daily as needed for moderate constipation.     . sitaGLIPtin (JANUVIA) 50 MG tablet Take 50 mg by mouth daily.     Marland Kitchen spironolactone (ALDACTONE) 25 MG tablet Take 0.5 tablets (12.5 mg total) by mouth 2 (two) times daily. (Patient taking differently: Take 25 mg by mouth 2 (two) times daily. ) 90 tablet 3  . SYNTHROID 137 MCG tablet Take 137 mcg by mouth daily before breakfast.     No current facility-administered medications for this visit.    Allergies:  Allergies  Allergen Reactions  . Lisinopril Cough  . Losartan Other (See Comments)    Patient reports nightmares/vivid dreams when taking.  Physical Exam:  Blood pressure (!) 142/72, pulse 83, temperature 97.9 F (36.6 C), temperature source Tympanic, resp. rate 18, height 5\' 8"  (1.727 m), weight 177 lb 1.6 oz (80.3 kg), SpO2 99 %.   ECOG: 1    General appearance: Comfortable appearing without any discomfort Head: Normocephalic without any trauma Oropharynx: Mucous membranes are moist and pink without any thrush or ulcers. Eyes: Pupils are equal and round reactive to light. Lymph nodes: No cervical, supraclavicular, inguinal or axillary lymphadenopathy.   Heart:regular rate and rhythm.  S1 and S2 without leg edema. Lung: Clear without any rhonchi or wheezes.  No dullness to percussion. Abdomin:  Soft, nontender, nondistended with good bowel sounds.  No hepatosplenomegaly. Musculoskeletal: No joint deformity or effusion.  Full range of motion noted. Neurological: No deficits noted on motor, sensory and deep tendon reflex exam. Skin: No petechial rash or dryness.  Appeared moist.       Lab Results: Lab Results  Component Value Date   WBC 13.6 (H) 09/25/2020   HGB 11.9 (L) 09/25/2020   HCT 38.2 09/25/2020   MCV 89.3 09/25/2020   PLT 456 (H) 09/25/2020     Chemistry      Component Value Date/Time   NA 134 (L) 06/22/2020 1143   NA 140 03/22/2018 0957   NA 140 09/08/2017 1417   K 5.1 06/22/2020 1143   K 4.0 09/08/2017 1417   CL 105 06/22/2020 1143   CL 106 04/26/2013 1252   CO2 18 (L) 06/22/2020 1143   CO2 24 09/08/2017 1417   BUN 52 (H) 06/22/2020 1143   BUN 28 (H) 03/22/2018 0957   BUN 26.9 (H) 09/08/2017 1417   CREATININE 1.87 (H) 06/22/2020 1143   CREATININE 1.84 (H) 04/22/2020 0941   CREATININE 1.4 (H) 09/08/2017 1417      Component Value Date/Time   CALCIUM 8.6 (L) 06/22/2020 1143   CALCIUM 9.5 09/08/2017 1417   ALKPHOS 70 06/12/2020 1529   ALKPHOS 44 09/08/2017 1417   AST 19 06/12/2020 1529   AST 12 (L) 04/22/2020 0941   AST 19 09/08/2017 1417   ALT 23 06/12/2020 1529   ALT 13 04/22/2020 0941   ALT 23 09/08/2017 1417   BILITOT 0.8 06/12/2020 1529   BILITOT 0.8 04/22/2020 0941   BILITOT 0.65 09/08/2017 1417         Impression and Plan:   79 year old woman with:   1.  CML diagnosed in 2013.  She was found to have chronic phase and remains in hematological remission.   Laboratory data from today reviewed and she continues to be in hematological remission.  Risks and benefits of continuing this medication were discussed.  She is agreeable at this time.  Molecular testing in May 2020 showed complete molecular remission at this time.  This will be repeated with the next visit.  Alternative treatment options were discussed will be deferred at this  time.   2.  Iron deficiency anemia: Improved at this time with hemoglobin close to normal.    3.  Peripheral vascular disease: She continues to recover from her recent surgery and follow-up with vascular surgery.  4. Follow-up: In 6 months for repeat follow-up.  30  minutes were spent on this encounter.  The time was dedicated to reviewing laboratory data, disease status update and future plan of care reviewed.   Zola Button 10/27/202112:12 PM

## 2020-10-02 ENCOUNTER — Other Ambulatory Visit: Payer: Self-pay | Admitting: Cardiovascular Disease

## 2020-10-02 DIAGNOSIS — D509 Iron deficiency anemia, unspecified: Secondary | ICD-10-CM | POA: Diagnosis not present

## 2020-10-02 DIAGNOSIS — M899 Disorder of bone, unspecified: Secondary | ICD-10-CM | POA: Diagnosis not present

## 2020-10-02 DIAGNOSIS — I701 Atherosclerosis of renal artery: Secondary | ICD-10-CM | POA: Diagnosis not present

## 2020-10-02 DIAGNOSIS — N184 Chronic kidney disease, stage 4 (severe): Secondary | ICD-10-CM | POA: Diagnosis not present

## 2020-10-02 DIAGNOSIS — R609 Edema, unspecified: Secondary | ICD-10-CM | POA: Diagnosis not present

## 2020-10-02 DIAGNOSIS — I129 Hypertensive chronic kidney disease with stage 1 through stage 4 chronic kidney disease, or unspecified chronic kidney disease: Secondary | ICD-10-CM | POA: Diagnosis not present

## 2020-10-22 ENCOUNTER — Encounter: Payer: Self-pay | Admitting: Vascular Surgery

## 2020-10-22 ENCOUNTER — Ambulatory Visit: Payer: PPO | Admitting: Vascular Surgery

## 2020-10-22 ENCOUNTER — Other Ambulatory Visit: Payer: Self-pay

## 2020-10-22 DIAGNOSIS — I701 Atherosclerosis of renal artery: Secondary | ICD-10-CM | POA: Diagnosis not present

## 2020-10-22 NOTE — Progress Notes (Signed)
Patient name: Sara Warner MRN: [id number] DOB: 1940-12-16 Sex: female  REASON FOR VISIT: Evaluate renal artery stenosis  HPI: Sara Warner is a 79 y.o. female with history of A. fib, chronic systolic CHF, history of CML, stage III chronic kidney disease, diabetes, peripheral vascular disease, hypertension that presents for evaluation of renal artery stenosis.  Ultimately patient states she had an ultrasound back in October that showed concern for renal artery stenosis and she was referred back to our practice.  She thinks her kidney numbers have been relatively stable although elevated.  She denies any issues with her blood pressure.  No other cardiopulmonary syndromes.  She is well-known to our practice and had a left common femoral endarterectomy with bovine pericardial patch angioplasty and then left above-knee popliteal endarterectomy and left common femoral to above-knee popliteal bypass with nonreversed ipsilateral great saphenous vein for critical limb ischemia with tissue loss on 06/20/2020.   She has also undergone right common femoral to below knee popliteal artery bypass on 12/15/2019 with great saphenous vein for critical limb ischemia with tissue loss.  She also underwent a right external iliac angioplasty with stent using several Lutonix stents on 12/06/2019 prior to her bypass to treat inflow disease for the bypass.   Past Medical History:  Diagnosis Date  . Atrial fibrillation ([place])   . Chronic atrial fibrillation ([place])    a. Refuses [place].  CHA2DS2VASc = 4.  . Chronic systolic CHF (congestive heart failure) ([place])    a. 05/2015 Echo: EF 35-40%.  . CKD (chronic kidney disease), stage III ([place])   . CML (chronic myelocytic leukemia) ([place]) 01/10/2013  . DM (diabetes mellitus) ([place])   . Dysrhythmia   . Elevated WBC count   . Epistaxis 04/03/2020   AFTER COVID SWAB  . Hypertension   . Hypertensive heart disease   . Hypothyroidism   . NICM (nonischemic cardiomyopathy) ([place])     a. 06/2013 MV: low risk study w/o ischemia;  b. 05/2015 Echo: EF 35-40%, diff HK, basal-midinferoseptal and basal-midanteroseptal AK. Mild-mod MR, mod dil LA, mild to mod TR, PASP 39mmHg.  Marland Kitchen PAD (peripheral artery disease) ([place])   . Pneumonia    30 yrs. ago    Past Surgical History:  Procedure Laterality Date  . ABDOMINAL AORTOGRAM W/LOWER EXTREMITY Bilateral 12/06/2019   Procedure: ABDOMINAL AORTOGRAM W/LOWER EXTREMITY;  Surgeon: [name], MD;  Location: [place] [facility];  Service: Cardiovascular;  Laterality: Bilateral;  . ABDOMINAL AORTOGRAM W/LOWER EXTREMITY Bilateral 05/15/2020   Procedure: ABDOMINAL AORTOGRAM W/LOWER EXTREMITY;  Surgeon: [name], MD;  Location: [place] [facility];  Service: Cardiovascular;  Laterality: Bilateral;  . ABDOMINAL HYSTERECTOMY    . APPLICATION OF WOUND VAC  01/10/2020   BLEEDING FROM FEMORAL BYPASS  . BIOPSY  04/08/2020   Procedure: BIOPSY;  Surgeon: Jackquline Denmark, MD;  Location[facility] ENDOSCOPY;  Service: Endoscopy;;  . CARDIOVERSION    . COLONOSCOPY WITH PROPOFOL N/A 04/08/2020   Procedure: COLONOSCOPY WITH PROPOFOL;  Surgeon: Jackquline Denmark, MD;  Location: [facility] ENDOSCOPY;  Service: Endoscopy;  Laterality: N/A;  . CYST EXCISION     removed on right ovary  . CYSTECTOMY    . ENDARTERECTOMY FEMORAL Left 06/20/2020   Procedure: LEFT COMMON FEMORAL ENDARTERECTOMY LEFT ABOVE KNEE POPLITEAL ENDARTERECTOMY;  Surgeon: [name], MD;  Location: [place];  Service: Vascular;  Laterality: Left;  . ESOPHAGOGASTRODUODENOSCOPY (EGD) WITH PROPOFOL N/A 04/08/2020   Procedure: ESOPHAGOGASTRODUODENOSCOPY (EGD) WITH PROPOFOL;  Surgeon: Jackquline Denmark, MD;  Location: [facility]  ENDOSCOPY;  Service: Endoscopy;  Laterality: N/A;  . FEMORAL ARTERY - POPLITEAL ARTERY BYPASS GRAFT  12/15/2019   Right common femoral artery to below-knee popliteal artery bypass with ipsilateral nonreversed great saphenous vein  . FEMORAL-POPLITEAL BYPASS GRAFT Right 12/15/2019    Procedure: BYPASS GRAFT FEMORAL-POPLITEAL ARTERY;  Surgeon: [name], MD;  Location: [place];  Service: Vascular;  Laterality: Right;  . FEMORAL-POPLITEAL BYPASS GRAFT Left 06/20/2020   Procedure: BYPASS GRAFT FEMORAL TO ABOVE KNEE POPLITEAL ARTERY WITH HARVENSTED NON REVERSED GREATER SAPHENOUS VEIN;  Surgeon: [name], MD;  Location: [place];  Service: Vascular;  Laterality: Left;  . HYSTEROTOMY    . PERIPHERAL VASCULAR INTERVENTION Right 12/06/2019   Procedure: PERIPHERAL VASCULAR INTERVENTION;  Surgeon: [name], MD;  Location: [place] [facility];  Service: Cardiovascular;  Laterality: Right;  EXT ILIAC  . POLYPECTOMY  04/08/2020   Procedure: POLYPECTOMY;  Surgeon: Jackquline Denmark, MD;  Location: [facility] ENDOSCOPY;  Service: Endoscopy;;  . WOUND EXPLORATION Right 01/10/2020   Procedure: right lower leg Wound Exploration with wound vac application;  Surgeon: Serafina Mitchell, MD;  Location: [facility] OR;  Service: Vascular;  Laterality: Right;    Family History  Problem Relation Age of Onset  . Cancer - Colon Mother        mast to lungs  . Diabetes Father   . Diabetes Paternal Grandmother     SOCIAL HISTORY: Social History   Tobacco Use  . Smoking status: Former Smoker    Types: Cigarettes    Quit date: 01/01/1984    Years since quitting: 36.8  . Smokeless tobacco: Never Used  Substance Use Topics  . Alcohol use: No    Allergies  Allergen Reactions  . Lisinopril Cough  . Losartan Other (See Comments)    Patient reports nightmares/vivid dreams when taking.    Current Outpatient Medications  Medication Sig Dispense Refill  . aspirin EC 81 MG tablet Take 1 tablet (81 mg total) by mouth daily.    . cetirizine (ZYRTEC) 10 MG tablet Take 10 mg by mouth daily.    . clorazepate (TRANXENE-T) 7.5 MG tablet Take 1 tablet (7.5 mg total) by mouth 2 (two) times daily as needed for anxiety. (Patient taking differently: Take 7.5 mg by mouth daily as needed for anxiety. ) 30  tablet 1  . digoxin (LANOXIN) 0.125 MG tablet Take 0.5 tablets (0.0625 mg total) by mouth every other day. 45 tablet 1  . Dulaglutide (TRULICITY) 3.81 WE/9.9BZ SOPN Inject 0.75 mg into the skin once a week. Take on Fridays    . furosemide (LASIX) 20 MG tablet TAKE 2 TABLETS BY MOUTH IN THE MORNING AND AN EXTRA TABLET IF GAIN 3 OR MORE POUNDS IN 1 DAY 270 tablet 1  . glipiZIDE (GLUCOTROL XL) 10 MG 24 hr tablet Take 10 mg by mouth 2 (two) times daily.     . metoprolol (TOPROL-XL) 200 MG 24 hr tablet TAKE 1 TABLET BY MOUTH EVERY DAY 90 tablet 3  . nilotinib (TASIGNA) 150 MG capsule Take 2 capsules (300 mg total) by mouth every 12 (twelve) hours. Take on an empty stomach, 1 hour before or 2 hours after meals. 120 capsule 0  . pantoprazole (PROTONIX) 40 MG tablet Take 1 tablet (40 mg total) by mouth daily. 30 tablet 1  . polyethylene glycol (MIRALAX / GLYCOLAX) 17 g packet Take 17 g by mouth daily as needed for moderate constipation.     . sitaGLIPtin (JANUVIA) 50 MG tablet Take  50 mg by mouth daily.     Marland Kitchen spironolactone (ALDACTONE) 25 MG tablet Take 0.5 tablets (12.5 mg total) by mouth 2 (two) times daily. (Patient taking differently: Take 25 mg by mouth 2 (two) times daily. ) 90 tablet 3  . SYNTHROID 137 MCG tablet Take 137 mcg by mouth daily before breakfast.    . atorvastatin (LIPITOR) 20 MG tablet TAKE 1 TABLET (20 MG TOTAL) BY MOUTH DAILY AT 6 PM. (Patient not taking: Reported on 10/22/2020) 90 tablet 2  . oxyCODONE (ROXICODONE) 5 MG immediate release tablet Take 1 tablet (5 mg total) by mouth every 8 (eight) hours as needed. (Patient not taking: Reported on 10/22/2020) 20 tablet 0  . oxyCODONE-acetaminophen (PERCOCET) 5-325 MG tablet Take 1 tablet by mouth every 6 (six) hours as needed. (Patient not taking: Reported on 10/22/2020) 20 tablet 0   No current facility-administered medications for this visit.    REVIEW OF SYSTEMS:  [X]  denotes positive finding, [ ]  denotes negative finding Cardiac   Comments:  Chest pain or chest pressure:    Shortness of breath upon exertion:    Short of breath when lying flat:    Irregular heart rhythm:        Vascular    Pain in calf, thigh, or hip brought on by ambulation:    Pain in feet at night that wakes you up from your sleep:     Blood clot in your veins:    Leg swelling:         Pulmonary    Oxygen at home:    Productive cough:     Wheezing:         Neurologic    Sudden weakness in arms or legs:     Sudden numbness in arms or legs:     Sudden onset of difficulty speaking or slurred speech:    Temporary loss of vision in one eye:     Problems with dizziness:         Gastrointestinal    Blood in stool:     Vomited blood:         Genitourinary    Burning when urinating:     Blood in urine:        Psychiatric    Major depression:         Hematologic    Bleeding problems:    Problems with blood clotting too easily:        Skin    Rashes or ulcers:        Constitutional    Fever or chills:      PHYSICAL EXAM: Vitals:   10/22/20 1524  BP: (!) 141/67  Pulse: 77  Resp: 16  SpO2: 99%  Weight: 177 lb (80.3 kg)  Height: 5\' 8"  (1.727 m)    GENERAL: The patient is a well-nourished female, in no acute distress. The vital signs are documented above. CARDIAC: There is a regular rate and rhythm.  VASCULAR:  Bilateral groin incisions are well-healed Bilateral femoral pulses are palpable  DATA:   Renal artery duplex 09/19/2020 shows near complete occlusion of the right renal artery at the ostium with diffuse cortical atrophy and the right kidney is smaller at 9 cm.  On the left she has a severe flow-limiting ostial stenosis with velocity 322 and renal aortic ratio greater than 4 and left kidney is normal size with a 12.9 cm length  Assessment/Plan:  79 year old female well-known for peripheral vascular disease that presents for evaluation  of renal artery stenosis in setting of stage 3 CKD.  Appears baseline Cr 1.9 on  last check.  I reviewed her renal artery duplex that shows likely bilateral high-grade renal artery stenosis.  I did offer her renal artery arteriogram with possible intervention including renal artery stenting.  I am concerned that the right kidney is smaller than the left and she may not have any significant benefit from right renal intervention and most likely would benefit more from left renal intervention.  We discussed evaluating both renal arteries at the same time in the cath lab through transfemoral access.  I did review her old aortogram pictures as well.  Ultimately she would like to think about her options and will call back to schedule.  We discussed renal artery intervention typically having a modest improvement in either blood pressure or baseline kidney function and risk benefits were discussed in detail.  She will call back to schedule if she decides to proceed.  Ultimately feels she has spent most of the year in the hospital.   [name], MD [facility]: [phone]

## 2020-10-29 DIAGNOSIS — E119 Type 2 diabetes mellitus without complications: Secondary | ICD-10-CM | POA: Diagnosis not present

## 2020-10-29 DIAGNOSIS — Z6827 Body mass index (BMI) 27.0-27.9, adult: Secondary | ICD-10-CM | POA: Diagnosis not present

## 2020-10-29 DIAGNOSIS — E890 Postprocedural hypothyroidism: Secondary | ICD-10-CM | POA: Diagnosis not present

## 2020-10-29 DIAGNOSIS — I4891 Unspecified atrial fibrillation: Secondary | ICD-10-CM | POA: Diagnosis not present

## 2020-10-29 DIAGNOSIS — M19049 Primary osteoarthritis, unspecified hand: Secondary | ICD-10-CM | POA: Diagnosis not present

## 2020-10-29 DIAGNOSIS — D509 Iron deficiency anemia, unspecified: Secondary | ICD-10-CM | POA: Diagnosis not present

## 2020-10-29 DIAGNOSIS — I872 Venous insufficiency (chronic) (peripheral): Secondary | ICD-10-CM | POA: Diagnosis not present

## 2020-10-29 DIAGNOSIS — N184 Chronic kidney disease, stage 4 (severe): Secondary | ICD-10-CM | POA: Diagnosis not present

## 2020-10-29 DIAGNOSIS — I739 Peripheral vascular disease, unspecified: Secondary | ICD-10-CM | POA: Diagnosis not present

## 2020-11-01 DIAGNOSIS — N184 Chronic kidney disease, stage 4 (severe): Secondary | ICD-10-CM | POA: Diagnosis not present

## 2020-11-01 DIAGNOSIS — N3091 Cystitis, unspecified with hematuria: Secondary | ICD-10-CM | POA: Diagnosis not present

## 2020-11-01 DIAGNOSIS — Z6827 Body mass index (BMI) 27.0-27.9, adult: Secondary | ICD-10-CM | POA: Diagnosis not present

## 2020-11-01 DIAGNOSIS — C9110 Chronic lymphocytic leukemia of B-cell type not having achieved remission: Secondary | ICD-10-CM | POA: Diagnosis not present

## 2020-11-08 DIAGNOSIS — Z6828 Body mass index (BMI) 28.0-28.9, adult: Secondary | ICD-10-CM | POA: Diagnosis not present

## 2020-11-08 DIAGNOSIS — R319 Hematuria, unspecified: Secondary | ICD-10-CM | POA: Diagnosis not present

## 2020-11-19 ENCOUNTER — Telehealth: Payer: Self-pay

## 2020-11-19 NOTE — Telephone Encounter (Signed)
Oral Oncology Patient Advocate Encounter  Met patient in [place] to complete a re-enrollment application for [employer] [facility] [place] ([employer]) in an effort to reduce the patient's out of pocket expense for Tasigna to $0.    Application completed and faxed to [phone].   [employer] phone number for follow up is [phone].   This encounter will be updated until final determination.   [place] [facility] [place] Phone [phone] Fax [phone] 11/19/2020 12:48 PM

## 2020-11-29 DIAGNOSIS — Z7984 Long term (current) use of oral hypoglycemic drugs: Secondary | ICD-10-CM | POA: Diagnosis not present

## 2020-11-29 DIAGNOSIS — E1165 Type 2 diabetes mellitus with hyperglycemia: Secondary | ICD-10-CM | POA: Diagnosis not present

## 2020-11-29 DIAGNOSIS — N183 Chronic kidney disease, stage 3 unspecified: Secondary | ICD-10-CM | POA: Diagnosis not present

## 2020-11-29 DIAGNOSIS — E1151 Type 2 diabetes mellitus with diabetic peripheral angiopathy without gangrene: Secondary | ICD-10-CM | POA: Diagnosis not present

## 2020-11-29 DIAGNOSIS — E1122 Type 2 diabetes mellitus with diabetic chronic kidney disease: Secondary | ICD-10-CM | POA: Diagnosis not present

## 2020-11-29 DIAGNOSIS — I130 Hypertensive heart and chronic kidney disease with heart failure and stage 1 through stage 4 chronic kidney disease, or unspecified chronic kidney disease: Secondary | ICD-10-CM | POA: Diagnosis not present

## 2020-11-29 DIAGNOSIS — I5022 Chronic systolic (congestive) heart failure: Secondary | ICD-10-CM | POA: Diagnosis not present

## 2020-12-24 ENCOUNTER — Other Ambulatory Visit: Payer: Self-pay | Admitting: *Deleted

## 2020-12-24 DIAGNOSIS — C9210 Chronic myeloid leukemia, BCR/ABL-positive, not having achieved remission: Secondary | ICD-10-CM

## 2020-12-24 MED ORDER — NILOTINIB HCL 150 MG PO CAPS: 300.0000 mg | ORAL_CAPSULE | Freq: Two times a day (BID) | ORAL | 0 refills | Status: DC

## 2020-12-27 DIAGNOSIS — N184 Chronic kidney disease, stage 4 (severe): Secondary | ICD-10-CM | POA: Diagnosis not present

## 2020-12-30 ENCOUNTER — Telehealth: Payer: Self-pay

## 2020-12-30 NOTE — Telephone Encounter (Signed)
Pt called stating she never received her Tasigna rx 12/26/20 and was on hold with the pharmacy for an extended period of time before handing up and is requesting we call. She states she ran out of her medication over the weekend.  I have called [employer] and spent over 21/2 hours on the phone trying to get an update on the pts rx. I was finally able to speak with DeJuan who advised the rx was never sent out as a new rx voided the previous one sent with the pt assistance application. He states they attempted to call the pt Saturday 1/29 and left a voicemail with no call back.   I requested for them to expedite shipment of her rx as she has run out of her medicine. DeJuan indicated they would need to speak with the pt directly in order to do that. I then conferenced in the pt to authorize the request.   Once I connected the pt, Olivia Mackie with [facility] was then conferenced onto the call. She advised the pt she would submit the rx for delivery for tomorrow 12/31/20. She also advised there are no refills on the pts rx and to avoid continued delay in the future, another rx with refills should be sent as soon as possible. I advised the pt I al alerting Dr. Alen Blew of this.

## 2020-12-31 ENCOUNTER — Encounter: Payer: Self-pay | Admitting: Sports Medicine

## 2020-12-31 ENCOUNTER — Ambulatory Visit: Payer: PPO | Admitting: Sports Medicine

## 2020-12-31 ENCOUNTER — Other Ambulatory Visit: Payer: Self-pay | Admitting: *Deleted

## 2020-12-31 ENCOUNTER — Other Ambulatory Visit: Payer: Self-pay

## 2020-12-31 DIAGNOSIS — L97511 Non-pressure chronic ulcer of other part of right foot limited to breakdown of skin: Secondary | ICD-10-CM | POA: Diagnosis not present

## 2020-12-31 DIAGNOSIS — M2142 Flat foot [pes planus] (acquired), left foot: Secondary | ICD-10-CM

## 2020-12-31 DIAGNOSIS — E1142 Type 2 diabetes mellitus with diabetic polyneuropathy: Secondary | ICD-10-CM

## 2020-12-31 DIAGNOSIS — M2040 Other hammer toe(s) (acquired), unspecified foot: Secondary | ICD-10-CM

## 2020-12-31 DIAGNOSIS — M2141 Flat foot [pes planus] (acquired), right foot: Secondary | ICD-10-CM

## 2020-12-31 DIAGNOSIS — L570 Actinic keratosis: Secondary | ICD-10-CM

## 2020-12-31 NOTE — Progress Notes (Signed)
Subjective: Sara Warner is a 80 y.o. female patient who presents to office for bleeding from a callus on the bottom of the right foot at the big toe joint.  Patient reports that she has always had callus for years but recently the callus has built up a lot and has been in the hospital a lot over the last year and was unable to come for routine foot care and so this time the callus has started to bleed.  Patient reports that she had vascular procedures by Dr. Carlis Abbott last year.  Patient denies nausea vomiting fever chills or any constitutional symptoms at this time.  No other issues noted.   ROS: Noncontributory  Blood sugar this morning 150 A1c unknown Last PCP visit 3 months ago   Patient Active Problem List   Diagnosis Date Noted  . Renal artery stenosis ([place]) 10/22/2020  . Peripheral arterial disease ([place]) 06/20/2020  . Iron deficiency anemia 04/22/2020  . Epistaxis   . Occult blood in stools   . Antiplatelet or antithrombotic long-term use   . Symptomatic anemia 04/03/2020  . Infection of graft ([place]) 01/10/2020  . PAD (peripheral artery disease) ([place]) 12/15/2019  . Critical lower limb ischemia ([place]) 11/28/2019  . Bilateral impacted cerumen 06/23/2017  . 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 NICM (nonischemic cardiomyopathy) ([place])   . Chronic atrial fibrillation ([place])   . Hypertensive heart disease   . Stage III chronic kidney disease ([place]) 10/02/2015  . Controlled type 2 diabetes mellitus without complication ([place]) 24/82/5003  . 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 Atrial fibrillation ([place]) 01/05/2012  . Hypothyroid 01/05/2012  . Cardiomyopathy ([place]) 01/05/2012    Current Outpatient Medications on File Prior to Visit  Medication Sig Dispense Refill  . aspirin EC 81 MG tablet Take 1 tablet (81 mg total) by mouth daily.    Marland Kitchen atorvastatin (LIPITOR) 20 MG tablet TAKE 1 TABLET (20 MG TOTAL) BY  MOUTH DAILY AT 6 PM. (Patient not taking: Reported on 10/22/2020) 90 tablet 2  . cetirizine (ZYRTEC) 10 MG tablet Take 10 mg by mouth daily.    . clorazepate (TRANXENE-T) 7.5 MG tablet Take 1 tablet (7.5 mg total) by mouth 2 (two) times daily as needed for anxiety. (Patient taking differently: Take 7.5 mg by mouth daily as needed for anxiety. ) 30 tablet 1  . digoxin (LANOXIN) 0.125 MG tablet Take 0.5 tablets (0.0625 mg total) by mouth every other day. 45 tablet 1  . Dulaglutide (TRULICITY) 7.04 UG/8.9VQ SOPN Inject 0.75 mg into the skin once a week. Take on Fridays    . furosemide (LASIX) 20 MG tablet TAKE 2 TABLETS BY MOUTH IN THE MORNING AND AN EXTRA TABLET IF GAIN 3 OR MORE POUNDS IN 1 DAY 270 tablet 1  . glipiZIDE (GLUCOTROL XL) 10 MG 24 hr tablet Take 10 mg by mouth 2 (two) times daily.     . metoprolol (TOPROL-XL) 200 MG 24 hr tablet TAKE 1 TABLET BY MOUTH EVERY DAY 90 tablet 3  . nilotinib (TASIGNA) 150 MG capsule Take 2 capsules (300 mg total) by mouth every 12 (twelve) hours. Take on an empty stomach, 1 hour before or 2 hours after meals. 120 capsule 0  . oxyCODONE (ROXICODONE) 5 MG immediate release tablet Take 1 tablet (5 mg total) by mouth every 8 (eight) hours as needed. (Patient not taking: Reported on 10/22/2020) 20 tablet 0  . oxyCODONE-acetaminophen (PERCOCET) 5-325 MG tablet  Take 1 tablet by mouth every 6 (six) hours as needed. (Patient not taking: Reported on 10/22/2020) 20 tablet 0  . pantoprazole (PROTONIX) 40 MG tablet Take 1 tablet (40 mg total) by mouth daily. 30 tablet 1  . polyethylene glycol (MIRALAX / GLYCOLAX) 17 g packet Take 17 g by mouth daily as needed for moderate constipation.     . sitaGLIPtin (JANUVIA) 50 MG tablet Take 50 mg by mouth daily.     Marland Kitchen spironolactone (ALDACTONE) 25 MG tablet Take 0.5 tablets (12.5 mg total) by mouth 2 (two) times daily. (Patient taking differently: Take 25 mg by mouth 2 (two) times daily. ) 90 tablet 3  . SYNTHROID 137 MCG tablet Take  137 mcg by mouth daily before breakfast.     No current facility-administered medications on file prior to visit.    Allergies  Allergen Reactions  . Lisinopril Cough  . Losartan Other (See Comments)    Patient reports nightmares/vivid dreams when taking.    Objective:  General: Alert and oriented x3 in no acute distress  Dermatology: Callus submet 1 on the right with active bleeding once debrided there is a small abrasion/fissure that measures less than 0.2 cm x 0.8 cm in length and no depth.  No active drainage.  No redness or warmth. No other acute signs of infection to these areas.  Vascular: Dorsalis Pedis and Posterior Tibial pedal pulses palpable 1/4, Capillary Fill Time 5 seconds,(+) pedal hair growth bilateral, Trace edema bilateral lower extremities, Temperature gradient within normal limits.  Neurology: Johney Maine sensation intact via light touch bilateral, Protective diminished bilateral.  Musculoskeletal: Minimal tenderness palpation to the right foot at affected area.  Patient has pes planus foot type.  Prominent metatarsal heads/hammertoe deformities noted.  Assessment and Plan: Problem List Items Addressed This Visit   None   Visit Diagnoses    Foot ulcer, limited to breakdown of skin, right (HCC)    -  Primary   Keratosis       DM type 2 with diabetic peripheral neuropathy ([place])          -Complete examination performed -Discussed treatment options for ulcer of right foot secondary to buildup of callus in the setting of diabetes - Excisionally dedbrided ulceration at right submet 1 to healthy bleeding borders removing nonviable tissue using a sterile chisel blade. Wound measures post debridement as above. Wound was debrided to the level of the dermis with viable wound base exposed to promote healing. Hemostasis was achieved with manuel pressure. Patient tolerated procedure well without any discomfort or anesthesia necessary for this wound debridement.  -Applied  antibiotic cream and dry sterile dressing and instructed patient to continue with daily dressings at home consisting of same using a Band-Aid daily for the next week until resolved -Patient to see [place] for new diabetic shoes to offload submet 1 bilateral -Patient to return to office in 6 weeks for wound check or sooner problems or issues arise.  Landis Martins, DPM

## 2021-01-08 DIAGNOSIS — L299 Pruritus, unspecified: Secondary | ICD-10-CM | POA: Diagnosis not present

## 2021-01-17 DIAGNOSIS — Z6828 Body mass index (BMI) 28.0-28.9, adult: Secondary | ICD-10-CM | POA: Diagnosis not present

## 2021-01-17 DIAGNOSIS — I739 Peripheral vascular disease, unspecified: Secondary | ICD-10-CM | POA: Diagnosis not present

## 2021-01-17 DIAGNOSIS — L03116 Cellulitis of left lower limb: Secondary | ICD-10-CM | POA: Diagnosis not present

## 2021-01-17 DIAGNOSIS — E119 Type 2 diabetes mellitus without complications: Secondary | ICD-10-CM | POA: Diagnosis not present

## 2021-01-23 ENCOUNTER — Other Ambulatory Visit: Payer: PPO | Admitting: Orthotics

## 2021-01-29 DIAGNOSIS — N184 Chronic kidney disease, stage 4 (severe): Secondary | ICD-10-CM | POA: Diagnosis not present

## 2021-01-29 DIAGNOSIS — M19049 Primary osteoarthritis, unspecified hand: Secondary | ICD-10-CM | POA: Diagnosis not present

## 2021-01-29 DIAGNOSIS — I739 Peripheral vascular disease, unspecified: Secondary | ICD-10-CM | POA: Diagnosis not present

## 2021-01-29 DIAGNOSIS — E119 Type 2 diabetes mellitus without complications: Secondary | ICD-10-CM | POA: Diagnosis not present

## 2021-01-29 DIAGNOSIS — Z1331 Encounter for screening for depression: Secondary | ICD-10-CM | POA: Diagnosis not present

## 2021-01-29 DIAGNOSIS — M79672 Pain in left foot: Secondary | ICD-10-CM | POA: Diagnosis not present

## 2021-01-29 DIAGNOSIS — I4891 Unspecified atrial fibrillation: Secondary | ICD-10-CM | POA: Diagnosis not present

## 2021-01-29 DIAGNOSIS — E890 Postprocedural hypothyroidism: Secondary | ICD-10-CM | POA: Diagnosis not present

## 2021-01-29 DIAGNOSIS — D509 Iron deficiency anemia, unspecified: Secondary | ICD-10-CM | POA: Diagnosis not present

## 2021-01-29 DIAGNOSIS — Z6827 Body mass index (BMI) 27.0-27.9, adult: Secondary | ICD-10-CM | POA: Diagnosis not present

## 2021-02-05 ENCOUNTER — Telehealth: Payer: Self-pay

## 2021-02-05 NOTE — Telephone Encounter (Signed)
Patient called to report pain in LLE and foot. Foot pain is off and on, sometimes when she is not doing anything - mostly when she walks. Tylenol relieves the pain. Has been ongoing for 2 months. Denies swelling. Foot is red. Discussed with PA, placed on schedule for f/u and studies - soonest appt available is 3/23 - advised to call back/report to ED if pain became constant/severe.

## 2021-02-11 ENCOUNTER — Encounter: Payer: Self-pay | Admitting: Sports Medicine

## 2021-02-11 ENCOUNTER — Ambulatory Visit: Payer: PPO | Admitting: Sports Medicine

## 2021-02-11 ENCOUNTER — Other Ambulatory Visit: Payer: Self-pay

## 2021-02-11 DIAGNOSIS — E1142 Type 2 diabetes mellitus with diabetic polyneuropathy: Secondary | ICD-10-CM | POA: Diagnosis not present

## 2021-02-11 DIAGNOSIS — M79674 Pain in right toe(s): Secondary | ICD-10-CM | POA: Diagnosis not present

## 2021-02-11 DIAGNOSIS — I739 Peripheral vascular disease, unspecified: Secondary | ICD-10-CM

## 2021-02-11 DIAGNOSIS — B351 Tinea unguium: Secondary | ICD-10-CM

## 2021-02-11 DIAGNOSIS — L570 Actinic keratosis: Secondary | ICD-10-CM

## 2021-02-11 DIAGNOSIS — M25472 Effusion, left ankle: Secondary | ICD-10-CM

## 2021-02-11 DIAGNOSIS — M79675 Pain in left toe(s): Secondary | ICD-10-CM

## 2021-02-11 DIAGNOSIS — M79605 Pain in left leg: Secondary | ICD-10-CM

## 2021-02-11 MED ORDER — TRAMADOL HCL 50 MG PO TABS: 50.0000 mg | ORAL_TABLET | Freq: Three times a day (TID) | ORAL | 0 refills | Status: AC | PRN

## 2021-02-11 NOTE — Progress Notes (Signed)
Subjective: Sara Warner is a 80 y.o. female patient who returns office for follow-up evaluation of callus to the bottom of her right foot and for nail care.  Patient also reports that she is having some pain and swelling in her left leg will be going on next week for a image of her leg and reports that her ankle swelling is slowly getting better but she still has pain that wakes her up at night and is concerned for vascular issues and will be seeing the vascular doctor.  Denies any acute trauma or injury to this area. Blood sugar this morning was not recorded but yesterday was 187 A1c 8 Last PCP visit 3 weeks ago   Patient Active Problem List   Diagnosis Date Noted  . Renal artery stenosis ([place]) 10/22/2020  . Peripheral arterial disease ([place]) 06/20/2020  . Iron deficiency anemia 04/22/2020  . Epistaxis   . Occult blood in stools   . Antiplatelet or antithrombotic long-term use   . Symptomatic anemia 04/03/2020  . Infection of graft ([place]) 01/10/2020  . PAD (peripheral artery disease) ([place]) 12/15/2019  . Critical lower limb ischemia ([place]) 11/28/2019  . Bilateral impacted cerumen 06/23/2017  . 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 NICM (nonischemic cardiomyopathy) ([place])   . Chronic atrial fibrillation ([place])   . Hypertensive heart disease   . Stage III chronic kidney disease ([place]) 10/02/2015  . Controlled type 2 diabetes mellitus without complication ([place]) 18/29/9371  . 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 Atrial fibrillation ([place]) 01/05/2012  . Hypothyroid 01/05/2012  . Cardiomyopathy ([place]) 01/05/2012    Current Outpatient Medications on File Prior to Visit  Medication Sig Dispense Refill  . aspirin EC 81 MG tablet Take 1 tablet (81 mg total) by mouth daily.    Marland Kitchen atorvastatin (LIPITOR) 20 MG tablet TAKE 1 TABLET (20 MG TOTAL) BY MOUTH DAILY AT 6 PM. (Patient not taking: Reported on 10/22/2020) 90  tablet 2  . cetirizine (ZYRTEC) 10 MG tablet Take 10 mg by mouth daily.    . clorazepate (TRANXENE-T) 7.5 MG tablet Take 1 tablet (7.5 mg total) by mouth 2 (two) times daily as needed for anxiety. (Patient taking differently: Take 7.5 mg by mouth daily as needed for anxiety. ) 30 tablet 1  . diclofenac Sodium (VOLTAREN) 1 % GEL Apply topically.    . digoxin (LANOXIN) 0.125 MG tablet Take 0.5 tablets (0.0625 mg total) by mouth every other day. 45 tablet 1  . Dulaglutide (TRULICITY) 6.96 VE/9.3YB SOPN Inject 0.75 mg into the skin once a week. Take on Fridays    . ferrous sulfate 325 (65 FE) MG tablet Take 325 mg by mouth daily.    . furosemide (LASIX) 20 MG tablet TAKE 2 TABLETS BY MOUTH IN THE MORNING AND AN EXTRA TABLET IF GAIN 3 OR MORE POUNDS IN 1 DAY 270 tablet 1  . glipiZIDE (GLUCOTROL XL) 10 MG 24 hr tablet Take 10 mg by mouth 2 (two) times daily.     . metoprolol (TOPROL-XL) 200 MG 24 hr tablet TAKE 1 TABLET BY MOUTH EVERY DAY 90 tablet 3  . nilotinib (TASIGNA) 150 MG capsule Take 2 capsules (300 mg total) by mouth every 12 (twelve) hours. Take on an empty stomach, 1 hour before or 2 hours after meals. 120 capsule 0  . nitrofurantoin, macrocrystal-monohydrate, (MACROBID) 100 MG capsule Take 100 mg by mouth 2 (two) times daily.    Marland Kitchen oxyCODONE (  ROXICODONE) 5 MG immediate release tablet Take 1 tablet (5 mg total) by mouth every 8 (eight) hours as needed. (Patient not taking: Reported on 10/22/2020) 20 tablet 0  . oxyCODONE-acetaminophen (PERCOCET) 5-325 MG tablet Take 1 tablet by mouth every 6 (six) hours as needed. (Patient not taking: Reported on 10/22/2020) 20 tablet 0  . pantoprazole (PROTONIX) 40 MG tablet Take 1 tablet (40 mg total) by mouth daily. 30 tablet 1  . polyethylene glycol (MIRALAX / GLYCOLAX) 17 g packet Take 17 g by mouth daily as needed for moderate constipation.     . predniSONE (DELTASONE) 20 MG tablet Take 20 mg by mouth 2 (two) times daily.    . sitaGLIPtin (JANUVIA) 50 MG  tablet Take 50 mg by mouth daily.     Marland Kitchen spironolactone (ALDACTONE) 25 MG tablet Take 0.5 tablets (12.5 mg total) by mouth 2 (two) times daily. (Patient taking differently: Take 25 mg by mouth 2 (two) times daily. ) 90 tablet 3  . SYNTHROID 137 MCG tablet Take 137 mcg by mouth daily before breakfast.    . triamcinolone (KENALOG) 0.1 % SMARTSIG:1 Application Topical 2-3 Times Daily     No current facility-administered medications on file prior to visit.    Allergies  Allergen Reactions  . Lisinopril Cough  . Losartan Other (See Comments)    Patient reports nightmares/vivid dreams when taking.    Objective:  General: Alert and oriented x3 in no acute distress  Dermatology: Callus submet 1 on the right w with no underlying opening.  Nails x10 are thickened elongated consistent with onychomycosis.  Vascular: Dorsalis Pedis and Posterior Tibial pedal pulses palpable 1/4, Capillary Fill Time 5 seconds,(+) pedal hair growth bilateral, Trace edema bilateral lower extremities with most swelling at left ankle, Temperature gradient within normal limits.  Neurology: Johney Maine sensation intact via light touch bilateral, Protective diminished bilateral.  Musculoskeletal: Mild tenderness palpation to left foot and ankle that radiates to her lower leg with swelling concerning for possible vascular component.  Pes planus foot type.  Prominent metatarsal heads/hammertoe deformities noted.  Assessment and Plan: Problem List Items Addressed This Visit      Cardiovascular and Mediastinum   Peripheral arterial disease ([place])    Other Visit Diagnoses    Keratosis    -  Primary   Pain due to onychomycosis of toenails of both feet       Left leg pain       Relevant Medications   traMADol (ULTRAM) 50 MG tablet   DM type 2 with diabetic peripheral neuropathy (HCC)       Swelling of ankle, left          -Complete examination performed -Mechanically debrided nails x10 using a sterile nail nipper without  incident - Mechanically debrided callus submet 1 on right using a sterile chisel blade without incident -Dispensed Surgigrip compression sleeve to use left foot and ankle support and for edema control -Prescribed tramadol for not relieved by chronic pain medications for her left foot and ankle pain. -Return in 10 weeks for routine foot care  Landis Martins, DPM

## 2021-02-12 ENCOUNTER — Telehealth: Payer: Self-pay | Admitting: Vascular Surgery

## 2021-02-12 DIAGNOSIS — I82432 Acute embolism and thrombosis of left popliteal vein: Secondary | ICD-10-CM | POA: Diagnosis not present

## 2021-02-12 DIAGNOSIS — E039 Hypothyroidism, unspecified: Secondary | ICD-10-CM | POA: Diagnosis not present

## 2021-02-12 DIAGNOSIS — I82412 Acute embolism and thrombosis of left femoral vein: Secondary | ICD-10-CM | POA: Diagnosis not present

## 2021-02-12 DIAGNOSIS — E1151 Type 2 diabetes mellitus with diabetic peripheral angiopathy without gangrene: Secondary | ICD-10-CM | POA: Diagnosis not present

## 2021-02-12 DIAGNOSIS — I739 Peripheral vascular disease, unspecified: Secondary | ICD-10-CM | POA: Diagnosis not present

## 2021-02-12 DIAGNOSIS — I70212 Atherosclerosis of native arteries of extremities with intermittent claudication, left leg: Secondary | ICD-10-CM | POA: Diagnosis not present

## 2021-02-12 DIAGNOSIS — N183 Chronic kidney disease, stage 3 unspecified: Secondary | ICD-10-CM | POA: Diagnosis not present

## 2021-02-12 DIAGNOSIS — I4891 Unspecified atrial fibrillation: Secondary | ICD-10-CM | POA: Diagnosis not present

## 2021-02-12 DIAGNOSIS — M79605 Pain in left leg: Secondary | ICD-10-CM | POA: Diagnosis not present

## 2021-02-12 DIAGNOSIS — E1122 Type 2 diabetes mellitus with diabetic chronic kidney disease: Secondary | ICD-10-CM | POA: Diagnosis not present

## 2021-02-12 NOTE — Telephone Encounter (Signed)
I received a call from Dr. Kennis Carina at [facility] presented there due to left foot pain.  This has been going on for several months and has become more severe.  She did not have any acute worsening.  She has motor and sensory function in her left foot.  She had called our office and was scheduled for an outpatient visit next week but felt that she could not wait this long.  She had undergone left femoral endarterectomy and left femoral to popliteal bypass with vein by Dr. Carlis Abbott in July 2021 for tissue loss.  She did not follow-up as scheduled in our office after this.  I have reviewed her duplex images from [facility].  She does appear to have occlusion of her femoral to popliteal bypass.  I reviewed her preoperative arteriogram.  This was CO2 proximally and contrast distally due to her renal insufficiency.  She did have a small but patent below-knee popliteal artery with three-vessel runoff.  She will be discharged this evening from the [facility] emergency department.  Dr. Kennis Carina will relate to the patient that our office will call tomorrow to schedule outpatient arteriography either at the end of this week or first of next week for further evaluation and planning for her left foot rest pain.

## 2021-02-13 DIAGNOSIS — I480 Paroxysmal atrial fibrillation: Secondary | ICD-10-CM | POA: Diagnosis not present

## 2021-02-13 DIAGNOSIS — M79605 Pain in left leg: Secondary | ICD-10-CM | POA: Diagnosis not present

## 2021-02-13 DIAGNOSIS — E261 Secondary hyperaldosteronism: Secondary | ICD-10-CM | POA: Diagnosis not present

## 2021-02-13 DIAGNOSIS — I429 Cardiomyopathy, unspecified: Secondary | ICD-10-CM | POA: Diagnosis not present

## 2021-02-13 DIAGNOSIS — E1122 Type 2 diabetes mellitus with diabetic chronic kidney disease: Secondary | ICD-10-CM | POA: Diagnosis not present

## 2021-02-13 DIAGNOSIS — D6869 Other thrombophilia: Secondary | ICD-10-CM | POA: Diagnosis not present

## 2021-02-13 DIAGNOSIS — C9292 Myeloid leukemia, unspecified in relapse: Secondary | ICD-10-CM | POA: Diagnosis not present

## 2021-02-13 DIAGNOSIS — E1151 Type 2 diabetes mellitus with diabetic peripheral angiopathy without gangrene: Secondary | ICD-10-CM | POA: Diagnosis not present

## 2021-02-13 DIAGNOSIS — E0590 Thyrotoxicosis, unspecified without thyrotoxic crisis or storm: Secondary | ICD-10-CM | POA: Diagnosis not present

## 2021-02-13 DIAGNOSIS — N183 Chronic kidney disease, stage 3 unspecified: Secondary | ICD-10-CM | POA: Diagnosis not present

## 2021-02-13 DIAGNOSIS — E1142 Type 2 diabetes mellitus with diabetic polyneuropathy: Secondary | ICD-10-CM | POA: Diagnosis not present

## 2021-02-13 DIAGNOSIS — M109 Gout, unspecified: Secondary | ICD-10-CM | POA: Diagnosis not present

## 2021-02-14 ENCOUNTER — Other Ambulatory Visit: Payer: Self-pay

## 2021-02-14 ENCOUNTER — Other Ambulatory Visit (HOSPITAL_COMMUNITY): Payer: PPO

## 2021-02-15 ENCOUNTER — Other Ambulatory Visit (HOSPITAL_COMMUNITY)
Admission: RE | Admit: 2021-02-15 | Discharge: 2021-02-15 | Disposition: A | Discharge status: Home / Self Care | Payer: PPO | Source: Ambulatory Visit | Attending: Vascular Surgery | Admitting: Vascular Surgery

## 2021-02-15 DIAGNOSIS — Z20822 Contact with and (suspected) exposure to covid-19: Secondary | ICD-10-CM | POA: Diagnosis not present

## 2021-02-15 DIAGNOSIS — Z01812 Encounter for preprocedural laboratory examination: Secondary | ICD-10-CM | POA: Insufficient documentation

## 2021-02-15 LAB — SARS CORONAVIRUS 2 (TAT 6-24 HRS): SARS Coronavirus 2: NEGATIVE

## 2021-02-18 ENCOUNTER — Ambulatory Visit (HOSPITAL_COMMUNITY)
Admission: RE | Disposition: A | Discharge status: Home / Self Care | Payer: Self-pay | Source: Home / Self Care | Attending: Surgery

## 2021-02-18 ENCOUNTER — Ambulatory Visit (HOSPITAL_COMMUNITY)
Admission: RE | Admit: 2021-02-18 | Discharge: 2021-02-18 | Disposition: A | Discharge status: Home / Self Care | Payer: PPO | Attending: Surgery | Admitting: Surgery

## 2021-02-18 ENCOUNTER — Other Ambulatory Visit: Payer: Self-pay

## 2021-02-18 DIAGNOSIS — Z87891 Personal history of nicotine dependence: Secondary | ICD-10-CM | POA: Diagnosis not present

## 2021-02-18 DIAGNOSIS — I5022 Chronic systolic (congestive) heart failure: Secondary | ICD-10-CM | POA: Insufficient documentation

## 2021-02-18 DIAGNOSIS — I701 Atherosclerosis of renal artery: Secondary | ICD-10-CM | POA: Diagnosis not present

## 2021-02-18 DIAGNOSIS — N183 Chronic kidney disease, stage 3 unspecified: Secondary | ICD-10-CM | POA: Insufficient documentation

## 2021-02-18 DIAGNOSIS — E1122 Type 2 diabetes mellitus with diabetic chronic kidney disease: Secondary | ICD-10-CM | POA: Insufficient documentation

## 2021-02-18 DIAGNOSIS — I70222 Atherosclerosis of native arteries of extremities with rest pain, left leg: Secondary | ICD-10-CM | POA: Insufficient documentation

## 2021-02-18 DIAGNOSIS — Z888 Allergy status to other drugs, medicaments and biological substances status: Secondary | ICD-10-CM | POA: Diagnosis not present

## 2021-02-18 DIAGNOSIS — I130 Hypertensive heart and chronic kidney disease with heart failure and stage 1 through stage 4 chronic kidney disease, or unspecified chronic kidney disease: Secondary | ICD-10-CM | POA: Insufficient documentation

## 2021-02-18 HISTORY — PX: ABDOMINAL AORTOGRAM W/LOWER EXTREMITY: CATH118223

## 2021-02-18 HISTORY — PX: PERIPHERAL VASCULAR INTERVENTION: CATH118257

## 2021-02-18 LAB — POCT I-STAT, CHEM 8
BUN: 35 mg/dL — ABNORMAL HIGH (ref 8–23)
Calcium, Ion: 1.15 mmol/L (ref 1.15–1.40)
Chloride: 101 mmol/L (ref 98–111)
Creatinine, Ser: 1.7 mg/dL — ABNORMAL HIGH (ref 0.44–1.00)
Glucose, Bld: 233 mg/dL — ABNORMAL HIGH (ref 70–99)
HCT: 47 % — ABNORMAL HIGH (ref 36.0–46.0)
Hemoglobin: 16 g/dL — ABNORMAL HIGH (ref 12.0–15.0)
Potassium: 4.2 mmol/L (ref 3.5–5.1)
Sodium: 138 mmol/L (ref 135–145)
TCO2: 25 mmol/L (ref 22–32)

## 2021-02-18 LAB — GLUCOSE, CAPILLARY: Glucose-Capillary: 217 mg/dL — ABNORMAL HIGH (ref 70–99)

## 2021-02-18 LAB — POCT ACTIVATED CLOTTING TIME
Activated Clotting Time: 214 seconds
Activated Clotting Time: 178 seconds

## 2021-02-18 SURGERY — ABDOMINAL AORTOGRAM W/LOWER EXTREMITY
Anesthesia: LOCAL

## 2021-02-18 MED ORDER — SODIUM CHLORIDE 0.9 % WEIGHT BASED INFUSION: 1.0000 mL/kg/h | INTRAVENOUS | Status: DC

## 2021-02-18 MED ORDER — LIDOCAINE HCL (PF) 1 % IJ SOLN
INTRAMUSCULAR | Status: DC | PRN
  Administered 2021-02-18: 15 mL via INTRADERMAL

## 2021-02-18 MED ORDER — HEPARIN (PORCINE) IN NACL 1000-0.9 UT/500ML-% IV SOLN
INTRAVENOUS | Status: AC
  Filled 2021-02-18: qty 1000

## 2021-02-18 MED ORDER — ROSUVASTATIN CALCIUM 5 MG PO TABS: 5.0000 mg | ORAL_TABLET | Freq: Every day | ORAL | 11 refills | Status: DC

## 2021-02-18 MED ORDER — IODIXANOL 320 MG/ML IV SOLN
INTRAVENOUS | Status: DC | PRN
  Administered 2021-02-18: 85 mL

## 2021-02-18 MED ORDER — FENTANYL CITRATE (PF) 100 MCG/2ML IJ SOLN
INTRAMUSCULAR | Status: AC
  Filled 2021-02-18: qty 2

## 2021-02-18 MED ORDER — FENTANYL CITRATE (PF) 100 MCG/2ML IJ SOLN
INTRAMUSCULAR | Status: DC | PRN
  Administered 2021-02-18 (×2): 25 ug via INTRAVENOUS

## 2021-02-18 MED ORDER — HEPARIN SODIUM (PORCINE) 1000 UNIT/ML IJ SOLN
INTRAMUSCULAR | Status: DC | PRN
  Administered 2021-02-18: 8000 [IU] via INTRAVENOUS

## 2021-02-18 MED ORDER — MIDAZOLAM HCL 2 MG/2ML IJ SOLN
INTRAMUSCULAR | Status: AC
  Filled 2021-02-18: qty 2

## 2021-02-18 MED ORDER — HEPARIN SODIUM (PORCINE) 1000 UNIT/ML IJ SOLN
INTRAMUSCULAR | Status: AC
  Filled 2021-02-18: qty 1

## 2021-02-18 MED ORDER — LIDOCAINE HCL (PF) 1 % IJ SOLN
INTRAMUSCULAR | Status: AC
  Filled 2021-02-18: qty 30

## 2021-02-18 MED ORDER — MIDAZOLAM HCL 2 MG/2ML IJ SOLN
INTRAMUSCULAR | Status: DC | PRN
  Administered 2021-02-18 (×2): 1 mg via INTRAVENOUS

## 2021-02-18 MED ORDER — SODIUM CHLORIDE 0.9 % IV SOLN: INTRAVENOUS | Status: DC

## 2021-02-18 SURGICAL SUPPLY — 19 items
BALLN VIATRAC 7X15X135 (BALLOONS) ×3
BALLOON VIATRAC 7X15X135 (BALLOONS) ×2 IMPLANT
CATH OMNI FLUSH 5F 65CM (CATHETERS) ×3 IMPLANT
FILTER CO2 0.2 MICRON (VASCULAR PRODUCTS) ×3 IMPLANT
GUIDE CATH VISTA JR4 6F (CATHETERS) ×3 IMPLANT
KIT ENCORE 26 ADVANTAGE (KITS) ×3 IMPLANT
KIT MICROPUNCTURE NIT STIFF (SHEATH) ×3 IMPLANT
KIT PV (KITS) ×3 IMPLANT
RESERVOIR CO2 (VASCULAR PRODUCTS) ×3 IMPLANT
SET FLUSH CO2 (MISCELLANEOUS) ×3 IMPLANT
SHEATH PINNACLE 5F 10CM (SHEATH) ×3 IMPLANT
SHEATH PINNACLE 6F 10CM (SHEATH) ×3 IMPLANT
SHEATH PROBE COVER 6X72 (BAG) ×3 IMPLANT
STENT HERCULINK RX 6.0X12X135 (Permanent Stent) ×3 IMPLANT
SYR MEDRAD MARK V 150ML (SYRINGE) ×3 IMPLANT
TRANSDUCER W/STOPCOCK (MISCELLANEOUS) ×3 IMPLANT
TRAY PV CATH (CUSTOM PROCEDURE TRAY) ×3 IMPLANT
WIRE BENTSON .035X145CM (WIRE) ×3 IMPLANT
WIRE STABILIZER XS .014X180CM (WIRE) ×3 IMPLANT

## 2021-02-18 NOTE — Op Note (Signed)
Patient name: Sara Warner MRN: [id number] DOB: [birth year] Sex: female  02/18/2021 Pre-operative Diagnosis: Left leg rest pain Post-operative diagnosis:  Same Surgeon:  Annamarie Major Procedure Performed:  1.  Ultrasound-guided access, right femoral artery  2.  Abdominal aortogram with CO2  3.  Second-order catheterization  4.  Left lower extremity runoff  5.  Stent, left renal artery  6.  Conscious sedation, 40 minutes    Indications: This is a 80 year old female with history of bilateral femoral-popliteal bypass grafts and known renal artery stenosis with stage III CKD.  She recently presented to the emergency department with a 79-month history of left leg pain.  Ultrasound revealed that her bypass graft on the left was occluded.  She is here today for further imaging and intervention.  Procedure:  The patient was identified in the holding area and taken to room 8.  The patient was then placed supine on the table and prepped and draped in the usual sterile fashion.  A time out was called.  Conscious sedation was administered with the use of IV fentanyl and Versed under continuous physician and nurse monitoring.  Heart rate, blood pressure, and oxygen saturation were continuously monitored.  Total sedation time was 40 minutes.  Ultrasound was used to evaluate the right common femoral artery.  It was patent .  A digital ultrasound image was acquired.  A micropuncture needle was used to access the right common femoral artery under ultrasound guidance.  An 018 wire was advanced without resistance and a micropuncture sheath was placed.  The 018 wire was removed and a benson wire was placed.  The micropuncture sheath was exchanged for a 5 french sheath.  An omniflush catheter was advanced over the wire to the level of L-1.  An abdominal angiogram with CO2 was obtained.  Next, using the omniflush catheter and a benson wire, the aortic bifurcation was crossed and the catheter was placed into theleft  external iliac artery and left runoff was obtained.    Findings:   Aortogram: 90% left renal artery stenosis with poststenotic dilatation.  The right renal artery was not well visualized and appeared to be atretic.  The infrarenal abdominal aorta is widely patent.  The right common and external iliac arteries with the associated stents are patent without stenosis.  The left common and external iliac arteries are patent without stenosis.  Right Lower Extremity: Not evaluated  Left Lower Extremity: The left common femoral artery is occluded.  There is reconstitution of the profundofemoral artery.  The superficial femoral artery is occluded.  The bypass graft is occluded.  There is delayed reconstitution of the below-knee popliteal artery.  Runoff was difficult to determine secondary to proximal disease.  Intervention: After the above images were acquired the decision made to proceed with intervention.  The patient was fully heparinized.  Using a JR4 catheter and a 014 wire, the left renal artery was selected.  A 6 x 12 Herculink stent was deployed across the lesion.  Completion imaging showed that the stent was not fully expanded and so I inserted a 7 x 15 balloon and took this to nominal pressure.  On follow-up imaging the renal artery stent was widely patent with no residual stenosis.  Catheters and wires were removed.  Patient taken over here for sheath pull once her coagulation profile corrects.  Impression:  #1  90% left renal artery stenosis successfully stented using a 6 mm stent with no residual stenosis.  #2  No significant inflow disease  bilaterally.  #3  The left common femoral artery was occluded with reconstitution of the proximal profundofemoral artery.  Superficial femoral artery was also occluded and there appears to be a below-knee popliteal artery that reconstitutes.  Runoff was not well visualized due to proximal disease  #4  We discussed proceeding with a femoral endarterectomy and to  below-knee popliteal bypass graft    V. Annamarie Major, M.D., [facility] [place] Office: [phone] Pager:  [phone]

## 2021-02-18 NOTE — Discharge Instructions (Signed)
Angiogram, Care After This sheet gives you information about how to care for yourself after your procedure. Your health care provider may also give you more specific instructions. If you have problems or questions, contact your health care provider. What can I expect after the procedure? After the procedure, it is common to have:  Bruising and tenderness at the catheter insertion area.  A collection of blood (hematoma) at the insertion area. This may feel like a small lump under the skin at the insertion site. Follow these instructions at home: Insertion site care  Follow instructions from your health care provider about how to take care of your insertion site. Make sure you: ? Wash your hands with soap and water before and after you change your bandage (dressing). If soap and water are not available, use hand sanitizer. ? Change your dressing as told by your health care provider.  Do not take baths, swim, or use a hot tub until your health care provider approves.  You may shower 24-48 hours after the procedure, or as told by your health care provider. To clean the insertion site: ? Gently wash the area with plain soap and water. ? Pat the area dry with a clean towel. ? Do not rub the site. This may cause bleeding.  Check your insertion site every day for signs of infection. Check for: ? Redness, swelling, or pain. ? Fluid or blood. ? Warmth. ? Pus or a bad smell.  Do not apply powder or lotion to the site. Keep the site clean and dry.   Activity  Do not drive for 24 hours if you were given a sedative during your procedure.  Rest as told by your health care provider, usually for 1-2 days.  Do not lift anything that is heavier than 10 lb (4.5 kg), or the limit that you are told, until your health care provider says that it is safe.  If the insertion site was in your leg, try to avoid stairs for a few days.  Return to your normal activities as told by your health care provider,  usually in about a week. Ask your health care provider what activities are safe for you. General instructions  If your insertion site starts bleeding, lie flat and put pressure on the site. If the bleeding does not stop, get help right away. This is a medical emergency.  Take over-the-counter and prescription medicines only as told by your health care provider.  Drink enough fluid to keep your urine pale yellow. This helps flush the contrast dye from your body.  Keep all follow-up visits as told by your health care provider. This is important.   Contact a health care provider if:  You have a fever or chills.  You have redness, swelling, or pain around your insertion site.  You have fluid or blood coming from your insertion site.  Your insertion site feels warm to the touch.  You have pus or a bad smell coming from your insertion site.  You have more bruising around the insertion site. Get help right away if you have:  A problem with the insertion area, such as: ? The area swells fast or bleeds even after you apply pressure. ? The area becomes pale, cool, tingly, or numb.  Chest pain.  Trouble breathing.  A rash.  Any symptoms of a stroke. "BE FAST" is an easy way to remember the main warning signs of a stroke: ? B - Balance. Signs are dizziness, sudden trouble walking,   or loss of balance. ? E - Eyes. Signs are trouble seeing or a sudden change in vision. ? F - Face. Signs are sudden weakness or loss of feeling of the face, or the face or eyelid drooping on one side. ? A - Arms. Signs are weakness or loss of feeling in an arm. This happens suddenly and usually on one side of the body. ? S - Speech. Signs are sudden trouble speaking, slurred speech, or trouble understanding what people say. ? T - Time. Time to call emergency services. Write down what time symptoms started.  You have other signs of a stroke, such as: ? A sudden, severe headache with no known cause. ? Nausea  or vomiting. ? Seizure. These symptoms may represent a serious problem that is an emergency. Do not wait to see if the symptoms will go away. Get medical help right away. Call your local emergency services (911 in the [place] Do not drive yourself to the hospital. Summary  It is common to have bruising and tenderness at the catheter insertion area.  Do not take baths, swim, or use a hot tub until your health care provider approves. You may shower 24-48 hours after the procedure or as told.  It is important to rest and drink plenty of fluids.  If the insertion site bleeds, lie flat and put pressure on the site. If the bleeding continues, get help right away. This is a medical emergency. This information is not intended to replace advice given to you by your health care provider. Make sure you discuss any questions you have with your health care provider. Document Revised: 09/20/2019 Document Reviewed: 09/20/2019 Elsevier Patient Education  2021 [place].    Ureteral Stent Implantation, Care After This sheet gives you information about how to care for yourself after your procedure. Your health care provider may also give you more specific instructions. If you have problems or questions, contact your health care provider. What can I expect after the procedure? After the procedure, it is common to have:  Nausea.  Mild pain when you urinate. You may feel this pain in your lower back or lower abdomen. The pain should stop within a few minutes after you urinate. This may last for up to 1 week.  A small amount of blood in your urine for several days. Follow these instructions at home: Medicines  Take over-the-counter and prescription medicines only as told by your health care provider.  If you were prescribed an antibiotic medicine, take it as told by your health care provider. Do not stop taking the antibiotic even if you start to feel better.  Do not drive for 24 hours if you were  given a sedative during your procedure.  Ask your health care provider if the medicine prescribed to you requires you to avoid driving or using heavy machinery. Activity  Rest as told by your health care provider.  Avoid sitting for a long time without moving. Get up to take short walks every 1-2 hours. This is important to improve blood flow and breathing. Ask for help if you feel weak or unsteady.  Return to your normal activities as told by your health care provider. Ask your health care provider what activities are safe for you. General instructions  Watch for any blood in your urine. Call your health care provider if the amount of blood in your urine increases.  If you have a catheter: ? Follow instructions from your health care provider about taking care  of your catheter and collection bag. ? Do not take baths, swim, or use a hot tub until your health care provider approves. Ask your health care provider if you may take showers. You may only be allowed to take sponge baths.  Drink enough fluid to keep your urine pale yellow.  Do not use any products that contain nicotine or tobacco, such as cigarettes, e-cigarettes, and chewing tobacco. These can delay healing after surgery. If you need help quitting, ask your health care provider.  Keep all follow-up visits as told by your health care provider. This is important.   Contact a health care provider if:  You have pain that gets worse or does not get better with medicine, especially pain when you urinate.  You have difficulty urinating.  You feel nauseous or you vomit repeatedly during a period of more than 2 days after the procedure. Get help right away if:  Your urine is dark red or has blood clots in it.  You are leaking urine (have incontinence).  The end of the stent comes out of your urethra.  You cannot urinate.  You have sudden, sharp, or severe pain in your abdomen or lower back.  You have a fever.  You have  swelling or pain in your legs.  You have difficulty breathing. Summary  After the procedure, it is common to have mild pain when you urinate that goes away within a few minutes after you urinate. This may last for up to 1 week.  Watch for any blood in your urine. Call your health care provider if the amount of blood in your urine increases.  Take over-the-counter and prescription medicines only as told by your health care provider.  Drink enough fluid to keep your urine pale yellow. This information is not intended to replace advice given to you by your health care provider. Make sure you discuss any questions you have with your health care provider. Document Revised: 08/23/2018 Document Reviewed: 08/24/2018 Elsevier Patient Education  2021 [employer].

## 2021-02-18 NOTE — Progress Notes (Signed)
Site area: right groin  Site Prior to Removal:  Level 0  Pressure Applied For 20 MINUTES    Minutes Beginning at 1150  Manual:   Yes.    Patient Status During Pull:  Stable  Post Pull Groin Site:  Level 0  Post Pull Instructions Given:  Yes.    Post Pull Pulses Present:  Yes.    Dressing Applied:  Yes.    Comments:  Bed rest started at 1210 X 4 hr.

## 2021-02-18 NOTE — Progress Notes (Addendum)
Pt is unable to urinate, pt states she is uncomfortable, states she has had this problem with inability t use purewick or bedpan, I have tried both, Dr Trula Slade has been paged for an order for in/out cath. In and out cath with RN/ tech, sterile protocol, 40fr straight cath, 750 ml yellow clear urine, last 50 ml was frank blood. Dr Trula Slade was called and notified via Modesta Messing RT in case currently with Dr Trula Slade.

## 2021-02-18 NOTE — H&P (Signed)
[facility]  Patient name: Sara Warner MRN: [id number] DOB: 1941-09-03 Sex: female    HISOTRY OF PRESENT ILLNESS:    Sara Warner is a 80 y.o. female Who presented to [facility] last week with a 51-month history of left leg pain.  Ultrasound revealed t less graft.  Hat her left leg bypass graft was occluded.  She is here for further evaluation.  The patient states that she walks with a walker.  She does not have any open wounds.  Patient has a history of a left common femoral endarterectomy with bovine patch angioplasty and left femoral above-knee popliteal artery endarterectomy with femoral to above-knee popliteal bypass with saphenous vein on 06/20/2020.  This was done for tissue loss.  She has healed her wound on her left leg.  The patient is also status post right common femoral to below-knee popliteal artery bypass graft on 12/15/2019 with saphenous vein which was also performed for tissue loss.  On 12/06/2019 she had right external iliac stenting using Elluvia stents to treat inflow stenosis of the bypass graft.  Dr. Carlis Abbott last saw her in November 2021 for renal artery stenosis.  Duplex showed near complete occlusion of the right renal artery with diffuse cortical atrophy with a 9 cm kidney.  On the left side she had a severe ostial stenosis with a normal-sized kidney measuring approximately 13 cm.  She has had issues with CKD 3.  Her creatinine today is 1.7.   PAST MEDICAL HISTORY:   Past Medical History:  Diagnosis Date  . Atrial fibrillation ([place])   . Chronic atrial fibrillation ([place])    a. Refuses [place].  CHA2DS2VASc = 4.  . Chronic systolic CHF (congestive heart failure) ([place])    a. 05/2015 Echo: EF 35-40%.  . CKD (chronic kidney disease), stage III ([place])   . 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 DM (diabetes mellitus) ([place])   . Dysrhythmia   . Elevated WBC count   . Epistaxis 04/03/2020    AFTER COVID SWAB  . Hypertension   . Hypertensive heart disease   . Hypothyroidism   . NICM (nonischemic cardiomyopathy) ([place])    a. 06/2013 MV: low risk study w/o ischemia;  b. 05/2015 Echo: EF 35-40%, diff HK, basal-midinferoseptal and basal-midanteroseptal AK. Mild-mod MR, mod dil LA, mild to mod TR, PASP 9mmHg.  Marland Kitchen PAD (peripheral artery disease) ([place])   . Pneumonia    30 yrs. ago     FAMILY HISTORY:   Family History  Problem Relation Age of Onset  . Cancer - Colon Mother        mast to lungs  . Diabetes Father   . Diabetes Paternal Grandmother     SOCIAL HISTORY:   Social History   Tobacco Use  . Smoking status: Former Smoker    Types: Cigarettes    Quit date: 01/01/1984    Years since quitting: 37.1  . Smokeless tobacco: Never Used  Substance Use Topics  . Alcohol use: No     ALLERGIES:   Allergies  Allergen Reactions  . Lisinopril Cough  . Losartan Other (See Comments)    Patient reports nightmares/vivid dreams when taking.     CURRENT MEDICATIONS:   Current Facility-Administered Medications  Medication Dose Route Frequency Provider Last Rate Last Admin  . 0.9 %  sodium chloride infusion   Intravenous Continuous Serafina Mitchell, MD 100 mL/hr at 02/18/21 0738 New Bag at 02/18/21 0738    REVIEW OF SYSTEMS:   [  X] denotes positive finding, [ ]  denotes negative finding Cardiac  Comments:  Chest pain or chest pressure:    Shortness of breath upon exertion:    Short of breath when lying flat:    Irregular heart rhythm:        Vascular    Pain in calf, thigh, or hip brought on by ambulation:    Pain in feet at night that wakes you up from your sleep:     Blood clot in your veins:    Leg swelling:         Pulmonary    Oxygen at home:    Productive cough:     Wheezing:         Neurologic    Sudden weakness in arms or legs:     Sudden numbness in arms or legs:     Sudden onset of difficulty speaking or slurred speech:    Temporary loss of  vision in one eye:     Problems with dizziness:         Gastrointestinal    Blood in stool:     Vomited blood:         Genitourinary    Burning when urinating:     Blood in urine:        Psychiatric    Major depression:         Hematologic    Bleeding problems:    Problems with blood clotting too easily:        Skin    Rashes or ulcers:        Constitutional    Fever or chills:      PHYSICAL EXAM:   Vitals:   02/18/21 0709  BP: (!) 150/97  Pulse: (!) 101  Temp: 97.9 F (36.6 C)  TempSrc: Oral  SpO2: 97%  Weight: 81.6 kg  Height: 5\' 8"  (1.727 m)    GENERAL: The patient is a well-nourished female, in no acute distress. The vital signs are documented above. CARDIAC: There is a regular rate and rhythm.  PULMONARY: Non-labored respirations ABDOMEN: Soft and non-tender with normal pitched bowel sounds.  MUSCULOSKELETAL: There are no major deformities or cyanosis. NEUROLOGIC: No focal weakness or paresthesias are detected. SKIN: There are no ulcers or rashes noted. PSYCHIATRIC: The patient has a normal affect.    MEDICAL ISSUES:   Left leg bypass graft occlusion: We discussed proceeding with angiography and diagnostic imaging to see what her surgical options are.  Renal artery stenosis: The patient has elevated creatinine with severe bilateral stenosis but an atrophied right renal.  We discussed evaluating her left renal artery stenosis and possibly treating this.    Leia Alf, MD, FACS [facility] [phone] Pager [phone]

## 2021-02-19 ENCOUNTER — Encounter (HOSPITAL_COMMUNITY): Payer: PPO

## 2021-02-19 ENCOUNTER — Other Ambulatory Visit: Payer: Self-pay

## 2021-02-19 ENCOUNTER — Telehealth: Payer: Self-pay

## 2021-02-19 ENCOUNTER — Encounter (HOSPITAL_COMMUNITY): Payer: Self-pay | Admitting: Surgery

## 2021-02-19 ENCOUNTER — Ambulatory Visit: Payer: PPO

## 2021-02-19 ENCOUNTER — Other Ambulatory Visit (HOSPITAL_COMMUNITY): Payer: PPO

## 2021-02-19 MED ORDER — SULFAMETHOXAZOLE-TRIMETHOPRIM 800-160 MG PO TABS: 1.0000 | ORAL_TABLET | Freq: Two times a day (BID) | ORAL | 0 refills | Status: DC

## 2021-02-19 MED FILL — Heparin Sod (Porcine)-NaCl IV Soln 1000 Unit/500ML-0.9%: INTRAVENOUS | Qty: 1000 | Status: AC

## 2021-02-19 NOTE — Telephone Encounter (Signed)
Patient is approved for Tasign[employer] at no cost from [employer] 02/19/21-11/29/21  [employer] uses [employer] by [place] [facility] [place] Phone [phone] Fax [phone] 02/19/2021 1:57 PM

## 2021-02-19 NOTE — Telephone Encounter (Signed)
Patient called in to report pain/burning and hesitancy with urination since yesterday. Says it feels like UTIs she has had previously. She has a redo fem pop bypass scheduled on Monday 02/24/21. Has not yet had PAT with urinalysis. Discussed with PA and called in 5 days of Bactrim DS and advised MD via e-message.

## 2021-02-20 ENCOUNTER — Other Ambulatory Visit (HOSPITAL_COMMUNITY)
Admission: RE | Admit: 2021-02-20 | Discharge: 2021-02-20 | Disposition: A | Discharge status: Home / Self Care | Payer: PPO | Source: Ambulatory Visit | Attending: Vascular Surgery | Admitting: Vascular Surgery

## 2021-02-20 DIAGNOSIS — Z20822 Contact with and (suspected) exposure to covid-19: Secondary | ICD-10-CM | POA: Diagnosis not present

## 2021-02-20 DIAGNOSIS — Z01812 Encounter for preprocedural laboratory examination: Secondary | ICD-10-CM | POA: Diagnosis not present

## 2021-02-20 LAB — SARS CORONAVIRUS 2 (TAT 6-24 HRS): SARS Coronavirus 2: NEGATIVE

## 2021-02-20 NOTE — Telephone Encounter (Signed)
MD agrees with antibiotic plan and will keep patient on surgery schedule for Monday. Called patient today to inform - she reports feeling much better and is able to pee. Advised to take entire course of Bactrim even though she is feeling better. Patient verbalizes understanding.

## 2021-02-21 ENCOUNTER — Encounter (HOSPITAL_COMMUNITY): Payer: Self-pay | Admitting: Vascular Surgery

## 2021-02-21 NOTE — Progress Notes (Signed)
Anesthesia Chart Review: Sara Warner   Case: [id number] Date/Time: 02/24/21 0715   Procedure: REDO LEFT FEMORAL-POPLITEAL ARTERY BYPASS GRAFT (Left )   Anesthesia type: General   Pre-op diagnosis: FEMORAL-POPLITEAL BPG OCCLUSION   Location: [facility] OR ROOM 11 / [place] OR   Surgeons: [name], MD      DISCUSSION: Patient is a 80 year old female scheduled for the above procedure. She is s/p left FPBG on 06/20/20 which was unfortunately noted to be occluded by duplex images at [facility] on 02/12/21 when she presented to the ED with worsening LLE pain and redness.    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  Patient last evaluated by her cardiologist Dr. Claiborne Billings in 08/21/20. No new cardiac testing ordered, and one year follow-up advised.   She had aortogram/LLE runoff and placement of left renal artery stent on 02/18/21. The above procedure was recommended. She required I&O cath post procedure due to urinary retention. She then developed dysuria and hesitancy and was started on Bactrim DS on 02/19/21. She is to continue ASA per [facility].   02/20/2021 presurgical COVID-19 test negative.  Anesthesia team to evaluate on the day of  surgery.   VS:  BP Readings from Last 3 Encounters:  02/18/21 130/70  10/22/20 (!) 141/67  09/25/20 (!) 142/72   Pulse Readings from Last 3 Encounters:  02/18/21 95  10/22/20 77  09/25/20 83     PROVIDERS: Cyndi Bender, PA-C is PCP [facility], [place], [place]) - Shelva Majestic, MD is cardiologist, whom she has been seeing since 07/03/13 (previously saw Dr. Doreatha Lew ~ 2008-2012 and Dr. Stanford Breed in 2013). Last visit 08/21/20. One year follow-up recommended. Zola Button, MD is HEM-ONC. Last evaluation 09/25/20.CML in clinical remission. 6 month follow-up planned.    LABS: For day of surgery. Currently comparison labs include: Lab Results  Component Value Date   WBC 13.6 (H) 09/25/2020   HGB 16.0 (H) 02/18/2021   HCT 47.0 (H) 02/18/2021   PLT 456 (H) 09/25/2020   GLUCOSE 233 (H) 02/18/2021   ALT 15 09/25/2020   AST 13 (L) 09/25/2020   NA 138 02/18/2021   K 4.2 02/18/2021   CL 101 02/18/2021   CREATININE 1.70 (H) 02/18/2021   BUN 35 (H) 02/18/2021   CO2 27 09/25/2020   INR 1.1 06/12/2020   HGBA1C 7.2 (H) 06/12/2020    EKG: EKG 08/21/20 ([facility]): Atrial fibrillation at 69 bpm.  QTc interval 411 ms.   CV: Aortogram with LLE runoff 02/18/21: Impression:             #1  90% left renal artery stenosis successfully stented using a 6 mm stent with no residual stenosis.             #2  No significant inflow disease bilaterally.             #3  The left common femoral artery was  occluded with reconstitution of the proximal profundofemoral artery.  Superficial femoral artery was also occluded and there appears to be a below-knee popliteal artery that reconstitutes.  Runoff was not well visualized due to proximal disease             #4  We discussed proceeding with a femoral endarterectomy and to below-knee popliteal bypass graft               Echo 02/04/18: Study Conclusions - Left ventricle: The cavity size was moderately dilated. Wall thickness was normal.  Systolic function was mildly reduced. The estimated ejection fraction was in the range of 45% to 50%. There is severe hypokinesis of the septal myocardium. - Mitral valve: There was mild regurgitation. - Left atrium: The atrium was moderately dilated. - Right atrium: The atrium was moderately dilated. - Pulmonary arteries: PA peak pressure: 35 mm Hg (S). Impressions: - Severe septal hyokinesis with overall mild LV dysfunction; moderate LVE; mild MR; moderate biatrial enlargement; mild TR. (Comparison: 05/30/15 LVEF 35-40% with diffuse hypokinesis, akinesis of the basal-midinferoseptal myocardium and basal midanteroseptal myocardium)   Nuclear stress test 07/05/13: Overall Impression: Low risk stress nuclear study with a small amount of septal, basal anteroseptal artifact. No significant reversible ischemia. LV Wall Motion: Non-gated study.   Past Medical History:  Diagnosis Date  . Atrial fibrillation ([place])   . Chronic atrial fibrillation ([place])    a. Refuses [place].  CHA2DS2VASc = 4.  . Chronic systolic CHF (congestive heart failure) ([place])    a. 05/2015 Echo: EF 35-40%.  . CKD (chronic kidney disease), stage III ([place])   . CML (chronic myelocytic leukemia) ([place]) 01/10/2013  . DM (diabetes mellitus) ([place])   . Dysrhythmia   . Elevated WBC count   . Epistaxis 04/03/2020   AFTER COVID SWAB  . Hypertension   . Hypertensive heart disease   . Hypothyroidism   . NICM (nonischemic cardiomyopathy) ([place])    a. 06/2013 MV: low risk study w/o ischemia;  b. 05/2015 Echo: EF 35-40%, diff HK, basal-midinferoseptal and basal-midanteroseptal AK. Mild-mod MR, mod dil LA, mild to mod TR, PASP 82mmHg.  Marland Kitchen PAD (peripheral artery disease) ([place])   . Pneumonia    30 yrs. ago  . Renal artery stenosis ([facility])    s/p left renal artery stent 02/18/21    Past Surgical History:  Procedure Laterality Date  . ABDOMINAL AORTOGRAM W/LOWER EXTREMITY Bilateral 12/06/2019   Procedure: ABDOMINAL AORTOGRAM W/LOWER  EXTREMITY;  Surgeon: [name], MD;  Location: [place] CV LAB;  Service: Cardiovascular;  Laterality: Bilateral;  . ABDOMINAL AORTOGRAM W/LOWER EXTREMITY Bilateral 05/15/2020   Procedure: ABDOMINAL AORTOGRAM W/LOWER EXTREMITY;  Surgeon: [name], MD;  Location: [place] [facility] LAB;  Service: Cardiovascular;  Laterality: Bilateral;  . ABDOMINAL AORTOGRAM W/LOWER EXTREMITY N/A 02/18/2021   Procedure: ABDOMINAL AORTOGRAM W/LOWER EXTREMITY;  Surgeon: Serafina Mitchell, MD;  Location: [place] CV LAB;  Service: Cardiovascular;  Laterality: N/A;  . ABDOMINAL HYSTERECTOMY    . APPLICATION OF WOUND VAC  01/10/2020   BLEEDING FROM FEMORAL BYPASS  . BIOPSY  04/08/2020   Procedure: BIOPSY;  Surgeon: Jackquline Denmark, MD;  Location: [facility] ENDOSCOPY;  Service: Endoscopy;;  . CARDIOVERSION    . COLONOSCOPY WITH PROPOFOL N/A 04/08/2020   Procedure: COLONOSCOPY WITH PROPOFOL;  Surgeon: Jackquline Denmark, MD;  Location: [facility] ENDOSCOPY;  Service: Endoscopy;  Laterality: N/A;  . CYST EXCISION     removed on right ovary  . CYSTECTOMY    . ENDARTERECTOMY  FEMORAL Left 06/20/2020   Procedure: LEFT COMMON FEMORAL ENDARTERECTOMY LEFT ABOVE KNEE POPLITEAL ENDARTERECTOMY;  Surgeon: [name], MD;  Location: [place];  Service: Vascular;  Laterality: Left;  . ESOPHAGOGASTRODUODENOSCOPY (EGD) WITH PROPOFOL N/A 04/08/2020   Procedure: ESOPHAGOGASTRODUODENOSCOPY (EGD) WITH PROPOFOL;  Surgeon: Jackquline Denmark, MD;  Location: [facility] ENDOSCOPY;  Service: Endoscopy;  Laterality: N/A;  . FEMORAL ARTERY - POPLITEAL ARTERY BYPASS GRAFT  12/15/2019   Right common femoral artery to below-knee popliteal artery bypass with ipsilateral nonreversed great saphenous vein  . FEMORAL-POPLITEAL BYPASS GRAFT Right 12/15/2019   Procedure: BYPASS GRAFT FEMORAL-POPLITEAL ARTERY;  Surgeon: [name], MD;  Location: [place];  Service: Vascular;  Laterality: Right;  . FEMORAL-POPLITEAL BYPASS GRAFT Left 06/20/2020   Procedure:  BYPASS GRAFT FEMORAL TO ABOVE KNEE POPLITEAL ARTERY WITH HARVENSTED NON REVERSED GREATER SAPHENOUS VEIN;  Surgeon: [name], MD;  Location: [place];  Service: Vascular;  Laterality: Left;  . HYSTEROTOMY    . PERIPHERAL VASCULAR INTERVENTION Right 12/06/2019   Procedure: PERIPHERAL VASCULAR INTERVENTION;  Surgeon: [name], MD;  Location: [place] CV LAB;  Service: Cardiovascular;  Laterality: Right;  EXT ILIAC  . PERIPHERAL VASCULAR INTERVENTION Left 02/18/2021   Procedure: PERIPHERAL VASCULAR INTERVENTION;  Surgeon: Serafina Mitchell, MD;  Location: [place] CV LAB;  Service: Cardiovascular;  Laterality: Left;  renal artery  . POLYPECTOMY  04/08/2020   Procedure: POLYPECTOMY;  Surgeon: Jackquline Denmark, MD;  Location: [facility] ENDOSCOPY;  Service: Endoscopy;;  . WOUND EXPLORATION Right 01/10/2020   Procedure: right lower leg Wound Exploration with wound vac application;  Surgeon: Serafina Mitchell, MD;  Location: [facility] OR;  Service: Vascular;  Laterality: Right;    MEDICATIONS: No current facility-administered medications for this encounter.   Marland Kitchen aspirin EC 81 MG tablet  . atorvastatin (LIPITOR) 20 MG tablet  . cetirizine (ZYRTEC) 10 MG tablet  . clorazepate (TRANXENE-T) 7.5 MG tablet  . digoxin (LANOXIN) 0.125 MG tablet  . Dulaglutide 3 MG/0.5ML SOPN  . furosemide (LASIX) 20 MG tablet  . glipiZIDE (GLUCOTROL XL) 10 MG 24 hr tablet  . levothyroxine (SYNTHROID) 125 MCG tablet  . metoprolol (TOPROL-XL) 200 MG 24 hr tablet  . nilotinib (TASIGNA) 150 MG capsule  . oxyCODONE (ROXICODONE) 5 MG immediate release tablet  . oxyCODONE-acetaminophen (PERCOCET) 5-325 MG tablet  . pantoprazole (PROTONIX) 40 MG tablet  . rosuvastatin (CRESTOR) 5 MG tablet  . sitaGLIPtin (JANUVIA) 50 MG tablet  . spironolactone (ALDACTONE) 25 MG tablet  . sulfamethoxazole-trimethoprim (BACTRIM DS) 800-160 MG tablet    Myra Gianotti, PA-C Surgical Short Stay/Anesthesiology [facility] Phone [phone] [facility] Phone [phone] 02/21/2021 5:17 PM

## 2021-02-21 NOTE — Anesthesia Preprocedure Evaluation (Addendum)
Anesthesia Evaluation  Patient identified by MRN, date of birth, ID band Patient awake    Reviewed: Allergy & Precautions, NPO status , Patient's Chart, lab work & pertinent test results  Airway Mallampati: II  TM Distance: >3 FB Neck ROM: Full    Dental  (+) Teeth Intact   Pulmonary neg pulmonary ROS, former smoker,    Pulmonary exam normal        Cardiovascular hypertension, Pt. on medications + Peripheral Vascular Disease and +CHF  + dysrhythmias Atrial Fibrillation  Rhythm:Regular Rate:Normal     Neuro/Psych negative neurological ROS  negative psych ROS   GI/Hepatic negative GI ROS, Neg liver ROS,   Endo/Other  diabetes, Type 2Hypothyroidism   Renal/GU Renal hypertension and CRFRenal disease  negative genitourinary   Musculoskeletal negative musculoskeletal ROS (+)   Abdominal (+)  Abdomen: soft. Bowel sounds: normal.  Peds  Hematology  (+) anemia , CML   Anesthesia Other Findings   Reproductive/Obstetrics                           Anesthesia Physical Anesthesia Plan  ASA: III  Anesthesia Plan: General   Post-op Pain Management:    Induction: Intravenous  PONV Risk Score and Plan: 3 and Ondansetron and Treatment may vary due to age or medical condition  Airway Management Planned: Mask and Oral ETT  Additional Equipment: Arterial line  Intra-op Plan:   Post-operative Plan: Extubation in OR  Informed Consent: I have reviewed the patients History and Physical, chart, labs and discussed the procedure including the risks, benefits and alternatives for the proposed anesthesia with the patient or authorized representative who has indicated his/her understanding and acceptance.     Dental advisory given  Plan Discussed with: CRNA  Anesthesia Plan Comments: (PAT note written 02/21/2021 by Myra Gianotti, PA-C. Lab Results      Component                Value                Date                      WBC                      15.9 (H)            02/24/2021                HGB                      15.1 (H)            02/24/2021                HCT                      46.1 (H)            02/24/2021                MCV                      86.5                02/24/2021                PLT  543 (H)             02/24/2021           Lab Results      Component                Value               Date                      NA                       130 (L)             02/24/2021                K                        4.4                 02/24/2021                CO2                      22                  02/24/2021                GLUCOSE                  201 (H)             02/24/2021                BUN                      38 (H)              02/24/2021                CREATININE               2.52 (H)            02/24/2021                CALCIUM                  9.4                 02/24/2021                GFRNONAA                 19 (L)              02/24/2021                GFRAA                    29 (L)              06/22/2020              Echo 02/04/18: Study Conclusions - Left ventricle: The cavity size was moderately dilated. Wall thickness was normal. Systolic function was mildly reduced. The estimated ejection fraction was in the range of 45% to 50%. There is severe hypokinesis of the septal myocardium. - Mitral valve: There was mild regurgitation. - Left atrium: The atrium was moderately  dilated. - Right atrium: The atrium was moderately dilated. - Pulmonary arteries: PA peak pressure: 35 mm Hg (S). Impressions: - Severe septal hyokinesis with overall mild LV dysfunction; moderate LVE; mild MR; moderate biatrial enlargement; mild TR. (Comparison: 05/30/15 LVEF 35-40% with diffuse hypokinesis, akinesis of the basal-midinferoseptal myocardium and basal midanteroseptal myocardium))      Anesthesia Quick Evaluation

## 2021-02-21 NOTE — Progress Notes (Signed)
Cardiologist: Shelva Majestic, MD  EKG: 08/21/20 CXR: na ECHO: 02/04/18 Stress Test: 07/05/13 Cardiac Cath: denies  Fasting Blood Sugar- 100-150 Checks Blood Sugar_2__ times a day  OSA/CPAP:  No  ASA: Continue Blood Thinners:  No  Covid test 3/24 negative  Anesthesia Review:  Yes, afib and CHF  Patient denies shortness of breath, fever, cough, and chest pain at PAT appointment.  Patient verbalized understanding of instructions provided today at the PAT appointment.  Patient asked to review instructions at home and day of surgery.

## 2021-02-24 ENCOUNTER — Inpatient Hospital Stay (HOSPITAL_COMMUNITY): Payer: PPO | Admitting: Vascular Surgery

## 2021-02-24 ENCOUNTER — Other Ambulatory Visit: Payer: Self-pay

## 2021-02-24 ENCOUNTER — Inpatient Hospital Stay (HOSPITAL_COMMUNITY)
Admission: RE | Admit: 2021-02-24 | Discharge: 2021-02-26 | DRG: 253 | Disposition: A | Discharge status: Home Health | Payer: PPO | Attending: Vascular Surgery | Admitting: Vascular Surgery

## 2021-02-24 ENCOUNTER — Encounter (HOSPITAL_COMMUNITY)
Admission: RE | Disposition: A | Discharge status: Home Health | Payer: Self-pay | Source: Home / Self Care | Attending: Vascular Surgery

## 2021-02-24 DIAGNOSIS — I739 Peripheral vascular disease, unspecified: Secondary | ICD-10-CM | POA: Diagnosis present

## 2021-02-24 DIAGNOSIS — I482 Chronic atrial fibrillation, unspecified: Secondary | ICD-10-CM | POA: Diagnosis present

## 2021-02-24 DIAGNOSIS — I998 Other disorder of circulatory system: Secondary | ICD-10-CM | POA: Diagnosis not present

## 2021-02-24 DIAGNOSIS — R531 Weakness: Secondary | ICD-10-CM | POA: Diagnosis not present

## 2021-02-24 DIAGNOSIS — I701 Atherosclerosis of renal artery: Secondary | ICD-10-CM | POA: Diagnosis present

## 2021-02-24 DIAGNOSIS — Z7982 Long term (current) use of aspirin: Secondary | ICD-10-CM

## 2021-02-24 DIAGNOSIS — Z79890 Hormone replacement therapy (postmenopausal): Secondary | ICD-10-CM | POA: Diagnosis not present

## 2021-02-24 DIAGNOSIS — Z87891 Personal history of nicotine dependence: Secondary | ICD-10-CM

## 2021-02-24 DIAGNOSIS — Z888 Allergy status to other drugs, medicaments and biological substances status: Secondary | ICD-10-CM | POA: Diagnosis not present

## 2021-02-24 DIAGNOSIS — I130 Hypertensive heart and chronic kidney disease with heart failure and stage 1 through stage 4 chronic kidney disease, or unspecified chronic kidney disease: Secondary | ICD-10-CM | POA: Diagnosis present

## 2021-02-24 DIAGNOSIS — K5900 Constipation, unspecified: Secondary | ICD-10-CM | POA: Diagnosis present

## 2021-02-24 DIAGNOSIS — I70222 Atherosclerosis of native arteries of extremities with rest pain, left leg: Principal | ICD-10-CM | POA: Diagnosis present

## 2021-02-24 DIAGNOSIS — T82898A Other specified complication of vascular prosthetic devices, implants and grafts, initial encounter: Secondary | ICD-10-CM | POA: Diagnosis not present

## 2021-02-24 DIAGNOSIS — E1159 Type 2 diabetes mellitus with other circulatory complications: Secondary | ICD-10-CM | POA: Diagnosis not present

## 2021-02-24 DIAGNOSIS — E1122 Type 2 diabetes mellitus with diabetic chronic kidney disease: Secondary | ICD-10-CM | POA: Diagnosis present

## 2021-02-24 DIAGNOSIS — Z856 Personal history of leukemia: Secondary | ICD-10-CM

## 2021-02-24 DIAGNOSIS — I428 Other cardiomyopathies: Secondary | ICD-10-CM | POA: Diagnosis not present

## 2021-02-24 DIAGNOSIS — N183 Chronic kidney disease, stage 3 unspecified: Secondary | ICD-10-CM | POA: Diagnosis not present

## 2021-02-24 DIAGNOSIS — I5022 Chronic systolic (congestive) heart failure: Secondary | ICD-10-CM | POA: Diagnosis not present

## 2021-02-24 DIAGNOSIS — Z79899 Other long term (current) drug therapy: Secondary | ICD-10-CM | POA: Diagnosis not present

## 2021-02-24 DIAGNOSIS — E039 Hypothyroidism, unspecified: Secondary | ICD-10-CM | POA: Diagnosis not present

## 2021-02-24 DIAGNOSIS — E1151 Type 2 diabetes mellitus with diabetic peripheral angiopathy without gangrene: Secondary | ICD-10-CM | POA: Diagnosis present

## 2021-02-24 DIAGNOSIS — Z833 Family history of diabetes mellitus: Secondary | ICD-10-CM | POA: Diagnosis not present

## 2021-02-24 DIAGNOSIS — Z7984 Long term (current) use of oral hypoglycemic drugs: Secondary | ICD-10-CM | POA: Diagnosis not present

## 2021-02-24 DIAGNOSIS — Z20822 Contact with and (suspected) exposure to covid-19: Secondary | ICD-10-CM | POA: Diagnosis not present

## 2021-02-24 HISTORY — PX: FEMORAL-POPLITEAL BYPASS GRAFT: SHX937

## 2021-02-24 HISTORY — DX: Atherosclerosis of renal artery: I70.1

## 2021-02-24 HISTORY — PX: ENDARTERECTOMY FEMORAL: SHX5804

## 2021-02-24 LAB — CBC
HCT: 46.1 % — ABNORMAL HIGH (ref 36.0–46.0)
Hemoglobin: 15.1 g/dL — ABNORMAL HIGH (ref 12.0–15.0)
MCH: 28.3 pg (ref 26.0–34.0)
MCHC: 32.8 g/dL (ref 30.0–36.0)
MCV: 86.5 fL (ref 80.0–100.0)
Platelets: 543 10*3/uL — ABNORMAL HIGH (ref 150–400)
RBC: 5.33 MIL/uL — ABNORMAL HIGH (ref 3.87–5.11)
RDW: 16.9 % — ABNORMAL HIGH (ref 11.5–15.5)
WBC: 15.9 10*3/uL — ABNORMAL HIGH (ref 4.0–10.5)
nRBC: 0 % (ref 0.0–0.2)

## 2021-02-24 LAB — POCT I-STAT, CHEM 8
BUN: 35 mg/dL — ABNORMAL HIGH (ref 8–23)
BUN: 37 mg/dL — ABNORMAL HIGH (ref 8–23)
BUN: 36 mg/dL — ABNORMAL HIGH (ref 8–23)
BUN: 35 mg/dL — ABNORMAL HIGH (ref 8–23)
Calcium, Ion: 1.17 mmol/L (ref 1.15–1.40)
Calcium, Ion: 1.23 mmol/L (ref 1.15–1.40)
Calcium, Ion: 1.16 mmol/L (ref 1.15–1.40)
Calcium, Ion: 1.18 mmol/L (ref 1.15–1.40)
Chloride: 103 mmol/L (ref 98–111)
Chloride: 101 mmol/L (ref 98–111)
Chloride: 101 mmol/L (ref 98–111)
Chloride: 102 mmol/L (ref 98–111)
Creatinine, Ser: 2.2 mg/dL — ABNORMAL HIGH (ref 0.44–1.00)
Creatinine, Ser: 2.4 mg/dL — ABNORMAL HIGH (ref 0.44–1.00)
Creatinine, Ser: 2.3 mg/dL — ABNORMAL HIGH (ref 0.44–1.00)
Creatinine, Ser: 2.2 mg/dL — ABNORMAL HIGH (ref 0.44–1.00)
Glucose, Bld: 240 mg/dL — ABNORMAL HIGH (ref 70–99)
Glucose, Bld: 219 mg/dL — ABNORMAL HIGH (ref 70–99)
Glucose, Bld: 213 mg/dL — ABNORMAL HIGH (ref 70–99)
Glucose, Bld: 207 mg/dL — ABNORMAL HIGH (ref 70–99)
HCT: 42 % (ref 36.0–46.0)
HCT: 42 % (ref 36.0–46.0)
HCT: 39 % (ref 36.0–46.0)
HCT: 36 % (ref 36.0–46.0)
Hemoglobin: 14.3 g/dL (ref 12.0–15.0)
Hemoglobin: 14.3 g/dL (ref 12.0–15.0)
Hemoglobin: 13.3 g/dL (ref 12.0–15.0)
Hemoglobin: 12.2 g/dL (ref 12.0–15.0)
Potassium: 4 mmol/L (ref 3.5–5.1)
Potassium: 4.5 mmol/L (ref 3.5–5.1)
Potassium: 4.7 mmol/L (ref 3.5–5.1)
Potassium: 4.5 mmol/L (ref 3.5–5.1)
Sodium: 135 mmol/L (ref 135–145)
Sodium: 133 mmol/L — ABNORMAL LOW (ref 135–145)
Sodium: 134 mmol/L — ABNORMAL LOW (ref 135–145)
Sodium: 135 mmol/L (ref 135–145)
TCO2: 21 mmol/L — ABNORMAL LOW (ref 22–32)
TCO2: 23 mmol/L (ref 22–32)
TCO2: 22 mmol/L (ref 22–32)
TCO2: 24 mmol/L (ref 22–32)

## 2021-02-24 LAB — URINALYSIS, ROUTINE W REFLEX MICROSCOPIC
Bacteria, UA: NONE SEEN
Bilirubin Urine: NEGATIVE
Glucose, UA: NEGATIVE mg/dL
Ketones, ur: NEGATIVE mg/dL
Leukocytes,Ua: NEGATIVE
Nitrite: NEGATIVE
Protein, ur: NEGATIVE mg/dL
Specific Gravity, Urine: 1.01 (ref 1.005–1.030)
pH: 5 (ref 5.0–8.0)

## 2021-02-24 LAB — COMPREHENSIVE METABOLIC PANEL
ALT: 20 U/L (ref 0–44)
AST: 20 U/L (ref 15–41)
Albumin: 4.3 g/dL (ref 3.5–5.0)
Alkaline Phosphatase: 52 U/L (ref 38–126)
Anion gap: 11 (ref 5–15)
BUN: 38 mg/dL — ABNORMAL HIGH (ref 8–23)
CO2: 22 mmol/L (ref 22–32)
Calcium: 9.4 mg/dL (ref 8.9–10.3)
Chloride: 97 mmol/L — ABNORMAL LOW (ref 98–111)
Creatinine, Ser: 2.52 mg/dL — ABNORMAL HIGH (ref 0.44–1.00)
GFR, Estimated: 19 mL/min — ABNORMAL LOW (ref >60)
Glucose, Bld: 201 mg/dL — ABNORMAL HIGH (ref 70–99)
Potassium: 4.4 mmol/L (ref 3.5–5.1)
Sodium: 130 mmol/L — ABNORMAL LOW (ref 135–145)
Total Bilirubin: 0.9 mg/dL (ref 0.3–1.2)
Total Protein: 7.2 g/dL (ref 6.5–8.1)

## 2021-02-24 LAB — POCT ACTIVATED CLOTTING TIME
Activated Clotting Time: 237 seconds
Activated Clotting Time: 285 seconds
Activated Clotting Time: 231 seconds
Activated Clotting Time: 238 seconds
Activated Clotting Time: 148 seconds

## 2021-02-24 LAB — POCT I-STAT 7, (LYTES, BLD GAS, ICA,H+H)
Acid-base deficit: 6 mmol/L — ABNORMAL HIGH (ref 0.0–2.0)
Acid-base deficit: 8 mmol/L — ABNORMAL HIGH (ref 0.0–2.0)
Bicarbonate: 20.6 mmol/L (ref 20.0–28.0)
Bicarbonate: 22.1 mmol/L (ref 20.0–28.0)
Calcium, Ion: 1.36 mmol/L (ref 1.15–1.40)
Calcium, Ion: 1.38 mmol/L (ref 1.15–1.40)
HCT: 31 % — ABNORMAL LOW (ref 36.0–46.0)
HCT: 31 % — ABNORMAL LOW (ref 36.0–46.0)
Hemoglobin: 10.5 g/dL — ABNORMAL LOW (ref 12.0–15.0)
Hemoglobin: 10.5 g/dL — ABNORMAL LOW (ref 12.0–15.0)
O2 Saturation: 100 %
O2 Saturation: 100 %
Patient temperature: 35.5
Patient temperature: 36
Potassium: 3.8 mmol/L (ref 3.5–5.1)
Potassium: 4 mmol/L (ref 3.5–5.1)
Sodium: 136 mmol/L (ref 135–145)
Sodium: 137 mmol/L (ref 135–145)
TCO2: 22 mmol/L (ref 22–32)
TCO2: 24 mmol/L (ref 22–32)
pCO2 arterial: 51.2 mmHg — ABNORMAL HIGH (ref 32.0–48.0)
pCO2 arterial: 51.9 mmHg — ABNORMAL HIGH (ref 32.0–48.0)
pH, Arterial: 7.206 — ABNORMAL LOW (ref 7.350–7.450)
pH, Arterial: 7.228 — ABNORMAL LOW (ref 7.350–7.450)
pO2, Arterial: 222 mmHg — ABNORMAL HIGH (ref 83.0–108.0)
pO2, Arterial: 224 mmHg — ABNORMAL HIGH (ref 83.0–108.0)

## 2021-02-24 LAB — HEMOGLOBIN A1C
Hgb A1c MFr Bld: 8.3 % — ABNORMAL HIGH (ref 4.8–5.6)
Mean Plasma Glucose: 191.51 mg/dL

## 2021-02-24 LAB — GLUCOSE, CAPILLARY
Glucose-Capillary: 170 mg/dL — ABNORMAL HIGH (ref 70–99)
Glucose-Capillary: 225 mg/dL — ABNORMAL HIGH (ref 70–99)
Glucose-Capillary: 181 mg/dL — ABNORMAL HIGH (ref 70–99)
Glucose-Capillary: 198 mg/dL — ABNORMAL HIGH (ref 70–99)
Glucose-Capillary: 239 mg/dL — ABNORMAL HIGH (ref 70–99)

## 2021-02-24 LAB — PROTIME-INR
INR: 1.1 (ref 0.8–1.2)
Prothrombin Time: 14.1 seconds (ref 11.4–15.2)

## 2021-02-24 LAB — APTT: aPTT: 36 seconds (ref 24–36)

## 2021-02-24 LAB — TYPE AND SCREEN
ABO/RH(D): A POS
Antibody Screen: NEGATIVE

## 2021-02-24 LAB — SURGICAL PCR SCREEN
MRSA, PCR: NEGATIVE
Staphylococcus aureus: NEGATIVE

## 2021-02-24 SURGERY — BYPASS GRAFT FEMORAL-POPLITEAL ARTERY
Anesthesia: General | Site: Leg Upper | Laterality: Left

## 2021-02-24 MED ORDER — ONDANSETRON HCL 4 MG/2ML IJ SOLN
INTRAMUSCULAR | Status: AC
  Filled 2021-02-24: qty 2

## 2021-02-24 MED ORDER — PROTAMINE SULFATE 10 MG/ML IV SOLN
INTRAVENOUS | Status: AC
  Filled 2021-02-24: qty 5

## 2021-02-24 MED ORDER — ONDANSETRON HCL 4 MG/2ML IJ SOLN: 4.0000 mg | Freq: Four times a day (QID) | INTRAMUSCULAR | Status: DC | PRN

## 2021-02-24 MED ORDER — SUGAMMADEX SODIUM 200 MG/2ML IV SOLN
INTRAVENOUS | Status: DC | PRN
  Administered 2021-02-24: 200 mg via INTRAVENOUS

## 2021-02-24 MED ORDER — ASPIRIN EC 81 MG PO TBEC: 81.0000 mg | DELAYED_RELEASE_TABLET | Freq: Every day | ORAL | Status: DC

## 2021-02-24 MED ORDER — ESMOLOL HCL 100 MG/10ML IV SOLN
INTRAVENOUS | Status: DC | PRN
  Administered 2021-02-24 (×2): 10 mg via INTRAVENOUS
  Administered 2021-02-24 (×2): 20 mg via INTRAVENOUS
  Administered 2021-02-24: 10 mg via INTRAVENOUS
  Administered 2021-02-24: 20 mg via INTRAVENOUS

## 2021-02-24 MED ORDER — LABETALOL HCL 5 MG/ML IV SOLN
10.0000 mg | INTRAVENOUS | Status: DC | PRN
Start: 2021-02-24 — End: 2021-02-26

## 2021-02-24 MED ORDER — SODIUM BICARBONATE 8.4 % IV SOLN
INTRAVENOUS | Status: DC | PRN
  Administered 2021-02-24: 25 meq via INTRAVENOUS

## 2021-02-24 MED ORDER — CALCIUM CHLORIDE 10 % IV SOLN
INTRAVENOUS | Status: DC | PRN
  Administered 2021-02-24 (×2): 200 mg via INTRAVENOUS
  Administered 2021-02-24 (×2): 100 mg via INTRAVENOUS
  Administered 2021-02-24 (×2): 200 mg via INTRAVENOUS

## 2021-02-24 MED ORDER — CHLORHEXIDINE GLUCONATE 0.12 % MT SOLN
15.0000 mL | Freq: Once | OROMUCOSAL | Status: AC
  Administered 2021-02-24: 15 mL via OROMUCOSAL
  Filled 2021-02-24: qty 15

## 2021-02-24 MED ORDER — PHENYLEPHRINE HCL-NACL 10-0.9 MG/250ML-% IV SOLN
INTRAVENOUS | Status: DC | PRN
  Administered 2021-02-24: 30 ug/min via INTRAVENOUS
  Administered 2021-02-24: 75 ug/min via INTRAVENOUS

## 2021-02-24 MED ORDER — SODIUM CHLORIDE 0.9 % IV SOLN: INTRAVENOUS | Status: DC | PRN

## 2021-02-24 MED ORDER — DULAGLUTIDE 3 MG/0.5ML ~~LOC~~ SOAJ: 3.0000 mg | SUBCUTANEOUS | Status: DC

## 2021-02-24 MED ORDER — EPHEDRINE SULFATE-NACL 50-0.9 MG/10ML-% IV SOSY
PREFILLED_SYRINGE | INTRAVENOUS | Status: DC | PRN
  Administered 2021-02-24: 5 mg via INTRAVENOUS
  Administered 2021-02-24: 10 mg via INTRAVENOUS

## 2021-02-24 MED ORDER — ORAL CARE MOUTH RINSE: 15.0000 mL | Freq: Once | OROMUCOSAL | Status: AC

## 2021-02-24 MED ORDER — PHENYLEPHRINE 40 MCG/ML (10ML) SYRINGE FOR IV PUSH (FOR BLOOD PRESSURE SUPPORT)
PREFILLED_SYRINGE | INTRAVENOUS | Status: DC | PRN
  Administered 2021-02-24: 80 ug via INTRAVENOUS

## 2021-02-24 MED ORDER — LACTATED RINGERS IV SOLN: INTRAVENOUS | Status: DC

## 2021-02-24 MED ORDER — CHLORHEXIDINE GLUCONATE CLOTH 2 % EX PADS: 6.0000 | MEDICATED_PAD | Freq: Once | CUTANEOUS | Status: DC

## 2021-02-24 MED ORDER — LINAGLIPTIN 5 MG PO TABS
5.0000 mg | ORAL_TABLET | Freq: Every day | ORAL | Status: DC
  Administered 2021-02-24 – 2021-02-26 (×3): 5 mg via ORAL
  Filled 2021-02-24 (×3): qty 1

## 2021-02-24 MED ORDER — ACETAMINOPHEN 10 MG/ML IV SOLN: 1000.0000 mg | Freq: Once | INTRAVENOUS | Status: DC | PRN

## 2021-02-24 MED ORDER — FENTANYL CITRATE (PF) 250 MCG/5ML IJ SOLN
INTRAMUSCULAR | Status: DC | PRN
  Administered 2021-02-24 (×2): 50 ug via INTRAVENOUS

## 2021-02-24 MED ORDER — DIGOXIN 125 MCG PO TABS
0.0625 mg | ORAL_TABLET | ORAL | Status: DC
  Administered 2021-02-26: 0.0625 mg via ORAL
  Filled 2021-02-24: qty 1

## 2021-02-24 MED ORDER — ESMOLOL HCL 100 MG/10ML IV SOLN
INTRAVENOUS | Status: AC
  Filled 2021-02-24: qty 10

## 2021-02-24 MED ORDER — HEMOSTATIC AGENTS (NO CHARGE) OPTIME
TOPICAL | Status: DC | PRN
  Administered 2021-02-24: 1 via TOPICAL
  Administered 2021-02-24: 2 via TOPICAL

## 2021-02-24 MED ORDER — MAGNESIUM SULFATE 2 GM/50ML IV SOLN: 2.0000 g | Freq: Every day | INTRAVENOUS | Status: DC | PRN

## 2021-02-24 MED ORDER — ONDANSETRON HCL 4 MG/2ML IJ SOLN: 4.0000 mg | Freq: Once | INTRAMUSCULAR | Status: DC | PRN

## 2021-02-24 MED ORDER — INSULIN ASPART 100 UNIT/ML ~~LOC~~ SOLN
0.0000 [IU] | Freq: Three times a day (TID) | SUBCUTANEOUS | Status: DC
  Administered 2021-02-25 – 2021-02-26 (×4): 3 [IU] via SUBCUTANEOUS

## 2021-02-24 MED ORDER — OXYCODONE HCL 5 MG/5ML PO SOLN: 5.0000 mg | Freq: Once | ORAL | Status: DC | PRN

## 2021-02-24 MED ORDER — PHENYLEPHRINE HCL-NACL 10-0.9 MG/250ML-% IV SOLN
INTRAVENOUS | Status: AC
  Filled 2021-02-24: qty 250

## 2021-02-24 MED ORDER — SODIUM CHLORIDE 0.9 % IV SOLN: INTRAVENOUS | Status: DC

## 2021-02-24 MED ORDER — HYDRALAZINE HCL 20 MG/ML IJ SOLN: 5.0000 mg | INTRAMUSCULAR | Status: DC | PRN

## 2021-02-24 MED ORDER — ROSUVASTATIN CALCIUM 5 MG PO TABS
5.0000 mg | ORAL_TABLET | Freq: Every day | ORAL | Status: DC
  Administered 2021-02-24 – 2021-02-25 (×2): 5 mg via ORAL
  Filled 2021-02-24 (×2): qty 1

## 2021-02-24 MED ORDER — KETAMINE HCL 50 MG/5ML IJ SOSY
PREFILLED_SYRINGE | INTRAMUSCULAR | Status: AC
  Filled 2021-02-24: qty 5

## 2021-02-24 MED ORDER — ATORVASTATIN CALCIUM 10 MG PO TABS: 20.0000 mg | ORAL_TABLET | Freq: Every day | ORAL | Status: DC

## 2021-02-24 MED ORDER — MORPHINE SULFATE (PF) 2 MG/ML IV SOLN: 2.0000 mg | INTRAVENOUS | Status: DC | PRN

## 2021-02-24 MED ORDER — ACETAMINOPHEN 650 MG RE SUPP
325.0000 mg | RECTAL | Status: DC | PRN
Start: 2021-02-24 — End: 2021-02-26

## 2021-02-24 MED ORDER — PROTAMINE SULFATE 10 MG/ML IV SOLN
INTRAVENOUS | Status: DC | PRN
  Administered 2021-02-24: 50 mg via INTRAVENOUS

## 2021-02-24 MED ORDER — FENTANYL CITRATE (PF) 100 MCG/2ML IJ SOLN: 25.0000 ug | INTRAMUSCULAR | Status: DC | PRN

## 2021-02-24 MED ORDER — CEFAZOLIN SODIUM 1 G IJ SOLR
INTRAMUSCULAR | Status: AC
  Filled 2021-02-24: qty 20

## 2021-02-24 MED ORDER — CEFAZOLIN SODIUM-DEXTROSE 2-4 GM/100ML-% IV SOLN
2.0000 g | INTRAVENOUS | Status: AC
  Administered 2021-02-24 (×2): 2 g via INTRAVENOUS
  Filled 2021-02-24: qty 100

## 2021-02-24 MED ORDER — GUAIFENESIN-DM 100-10 MG/5ML PO SYRP: 15.0000 mL | ORAL_SOLUTION | ORAL | Status: DC | PRN

## 2021-02-24 MED ORDER — GLIPIZIDE ER 10 MG PO TB24
10.0000 mg | ORAL_TABLET | Freq: Two times a day (BID) | ORAL | Status: DC
  Administered 2021-02-25 – 2021-02-26 (×3): 10 mg via ORAL
  Filled 2021-02-24 (×5): qty 1

## 2021-02-24 MED ORDER — LIDOCAINE 2% (20 MG/ML) 5 ML SYRINGE
INTRAMUSCULAR | Status: AC
  Filled 2021-02-24: qty 5

## 2021-02-24 MED ORDER — ROCURONIUM BROMIDE 100 MG/10ML IV SOLN
INTRAVENOUS | Status: DC | PRN
  Administered 2021-02-24 (×2): 20 mg via INTRAVENOUS
  Administered 2021-02-24: 60 mg via INTRAVENOUS

## 2021-02-24 MED ORDER — METOPROLOL TARTRATE 5 MG/5ML IV SOLN: 2.0000 mg | INTRAVENOUS | Status: DC | PRN

## 2021-02-24 MED ORDER — PROPOFOL 10 MG/ML IV BOLUS
INTRAVENOUS | Status: DC | PRN
  Administered 2021-02-24: 130 mg via INTRAVENOUS
  Administered 2021-02-24: 30 mg via INTRAVENOUS

## 2021-02-24 MED ORDER — CHLORHEXIDINE GLUCONATE CLOTH 2 % EX PADS
6.0000 | MEDICATED_PAD | Freq: Every day | CUTANEOUS | Status: DC
  Administered 2021-02-24 – 2021-02-25 (×2): 6 via TOPICAL

## 2021-02-24 MED ORDER — LEVOTHYROXINE SODIUM 25 MCG PO TABS
125.0000 ug | ORAL_TABLET | Freq: Every day | ORAL | Status: DC
  Administered 2021-02-25 – 2021-02-26 (×2): 125 ug via ORAL
  Filled 2021-02-24 (×2): qty 1

## 2021-02-24 MED ORDER — LIDOCAINE 2% (20 MG/ML) 5 ML SYRINGE
INTRAMUSCULAR | Status: DC | PRN
  Administered 2021-02-24: 60 mg via INTRAVENOUS

## 2021-02-24 MED ORDER — DEXAMETHASONE SODIUM PHOSPHATE 10 MG/ML IJ SOLN
INTRAMUSCULAR | Status: DC | PRN
  Administered 2021-02-24: 5 mg via INTRAVENOUS

## 2021-02-24 MED ORDER — FENTANYL CITRATE (PF) 250 MCG/5ML IJ SOLN
INTRAMUSCULAR | Status: AC
  Filled 2021-02-24: qty 5

## 2021-02-24 MED ORDER — SODIUM CHLORIDE 0.9 % IV SOLN
INTRAVENOUS | Status: AC
  Filled 2021-02-24: qty 1.2

## 2021-02-24 MED ORDER — OXYCODONE-ACETAMINOPHEN 5-325 MG PO TABS
1.0000 | ORAL_TABLET | ORAL | Status: DC | PRN
  Administered 2021-02-24: 1 via ORAL
  Filled 2021-02-24: qty 1

## 2021-02-24 MED ORDER — HEPARIN SODIUM (PORCINE) 1000 UNIT/ML IJ SOLN
INTRAMUSCULAR | Status: DC | PRN
  Administered 2021-02-24: 3000 [IU] via INTRAVENOUS
  Administered 2021-02-24: 8000 [IU] via INTRAVENOUS
  Administered 2021-02-24: 3000 [IU] via INTRAVENOUS

## 2021-02-24 MED ORDER — HEPARIN SODIUM (PORCINE) 1000 UNIT/ML IJ SOLN
INTRAMUSCULAR | Status: AC
  Filled 2021-02-24: qty 2

## 2021-02-24 MED ORDER — NILOTINIB HCL 150 MG PO CAPS
300.0000 mg | ORAL_CAPSULE | Freq: Two times a day (BID) | ORAL | Status: DC
  Administered 2021-02-24 – 2021-02-26 (×4): 300 mg via ORAL
  Filled 2021-02-24 (×4): qty 2

## 2021-02-24 MED ORDER — KETAMINE HCL 10 MG/ML IJ SOLN
INTRAMUSCULAR | Status: DC | PRN
  Administered 2021-02-24: 10 mg via INTRAVENOUS
  Administered 2021-02-24: 20 mg via INTRAVENOUS
  Administered 2021-02-24 (×2): 10 mg via INTRAVENOUS

## 2021-02-24 MED ORDER — PROPOFOL 1000 MG/100ML IV EMUL
INTRAVENOUS | Status: AC
  Filled 2021-02-24: qty 100

## 2021-02-24 MED ORDER — STERILE WATER FOR IRRIGATION IR SOLN
Status: DC | PRN
Start: 2021-02-24 — End: 2021-02-24
  Administered 2021-02-24: 1000 mL

## 2021-02-24 MED ORDER — DOCUSATE SODIUM 100 MG PO CAPS
100.0000 mg | ORAL_CAPSULE | Freq: Every day | ORAL | Status: DC
  Administered 2021-02-25 – 2021-02-26 (×2): 100 mg via ORAL
  Filled 2021-02-24 (×2): qty 1

## 2021-02-24 MED ORDER — PROPOFOL 500 MG/50ML IV EMUL
INTRAVENOUS | Status: DC | PRN
  Administered 2021-02-24: 30 ug/kg/min via INTRAVENOUS

## 2021-02-24 MED ORDER — ALUM & MAG HYDROXIDE-SIMETH 200-200-20 MG/5ML PO SUSP: 15.0000 mL | ORAL | Status: DC | PRN

## 2021-02-24 MED ORDER — PROPOFOL 10 MG/ML IV BOLUS
INTRAVENOUS | Status: AC
  Filled 2021-02-24: qty 20

## 2021-02-24 MED ORDER — POLYETHYLENE GLYCOL 3350 17 G PO PACK
17.0000 g | PACK | Freq: Every day | ORAL | Status: DC | PRN
  Administered 2021-02-25: 17 g via ORAL
  Filled 2021-02-24: qty 1

## 2021-02-24 MED ORDER — ACETAMINOPHEN 325 MG PO TABS
325.0000 mg | ORAL_TABLET | ORAL | Status: DC | PRN
Start: 2021-02-24 — End: 2021-02-26
  Administered 2021-02-24 – 2021-02-25 (×3): 650 mg via ORAL
  Filled 2021-02-24 (×3): qty 2

## 2021-02-24 MED ORDER — SODIUM BICARBONATE 8.4 % IV SOLN
INTRAVENOUS | Status: AC
  Filled 2021-02-24: qty 50

## 2021-02-24 MED ORDER — 0.9 % SODIUM CHLORIDE (POUR BTL) OPTIME
TOPICAL | Status: DC | PRN
  Administered 2021-02-24 (×3): 1000 mL

## 2021-02-24 MED ORDER — CLORAZEPATE DIPOTASSIUM 3.75 MG PO TABS: 7.5000 mg | ORAL_TABLET | Freq: Two times a day (BID) | ORAL | Status: DC | PRN

## 2021-02-24 MED ORDER — ONDANSETRON HCL 4 MG/2ML IJ SOLN
INTRAMUSCULAR | Status: DC | PRN
  Administered 2021-02-24 (×2): 4 mg via INTRAVENOUS

## 2021-02-24 MED ORDER — BISACODYL 10 MG RE SUPP
10.0000 mg | Freq: Every day | RECTAL | Status: DC | PRN
  Administered 2021-02-26: 10 mg via RECTAL
  Filled 2021-02-24: qty 1

## 2021-02-24 MED ORDER — INSULIN ASPART 100 UNIT/ML ~~LOC~~ SOLN
SUBCUTANEOUS | Status: DC | PRN
  Administered 2021-02-24 (×2): 5 [IU] via INTRAVENOUS
  Administered 2021-02-24: 10 [IU] via INTRAVENOUS

## 2021-02-24 MED ORDER — CEFAZOLIN SODIUM-DEXTROSE 2-4 GM/100ML-% IV SOLN
2.0000 g | Freq: Two times a day (BID) | INTRAVENOUS | Status: AC
  Administered 2021-02-24: 2 g via INTRAVENOUS
  Filled 2021-02-24 (×3): qty 100

## 2021-02-24 MED ORDER — SODIUM CHLORIDE 0.9 % IV SOLN
500.0000 mL | Freq: Once | INTRAVENOUS | Status: AC | PRN
  Administered 2021-02-25: 500 mL via INTRAVENOUS

## 2021-02-24 MED ORDER — ALBUMIN HUMAN 5 % IV SOLN: INTRAVENOUS | Status: DC | PRN

## 2021-02-24 MED ORDER — SODIUM CHLORIDE 0.9 % IV SOLN
INTRAVENOUS | Status: DC | PRN
  Administered 2021-02-24: 500 mL

## 2021-02-24 MED ORDER — CALCIUM CHLORIDE 10 % IV SOLN
INTRAVENOUS | Status: AC
  Filled 2021-02-24: qty 10

## 2021-02-24 MED ORDER — ROCURONIUM BROMIDE 10 MG/ML (PF) SYRINGE
PREFILLED_SYRINGE | INTRAVENOUS | Status: AC
  Filled 2021-02-24: qty 10

## 2021-02-24 MED ORDER — DEXAMETHASONE SODIUM PHOSPHATE 10 MG/ML IJ SOLN
INTRAMUSCULAR | Status: AC
  Filled 2021-02-24: qty 1

## 2021-02-24 MED ORDER — PANTOPRAZOLE SODIUM 40 MG PO TBEC
40.0000 mg | DELAYED_RELEASE_TABLET | Freq: Every day | ORAL | Status: DC
Start: 2021-02-24 — End: 2021-02-24

## 2021-02-24 MED ORDER — METOPROLOL SUCCINATE ER 100 MG PO TB24
200.0000 mg | ORAL_TABLET | Freq: Every day | ORAL | Status: DC
  Administered 2021-02-26: 200 mg via ORAL
  Filled 2021-02-24 (×2): qty 2

## 2021-02-24 MED ORDER — PHENOL 1.4 % MT LIQD: 1.0000 | OROMUCOSAL | Status: DC | PRN

## 2021-02-24 MED ORDER — OXYCODONE HCL 5 MG PO TABS: 5.0000 mg | ORAL_TABLET | Freq: Once | ORAL | Status: DC | PRN

## 2021-02-24 MED ORDER — POTASSIUM CHLORIDE CRYS ER 20 MEQ PO TBCR: 20.0000 meq | EXTENDED_RELEASE_TABLET | Freq: Every day | ORAL | Status: DC | PRN

## 2021-02-24 SURGICAL SUPPLY — 57 items
BANDAGE ESMARK 6X9 LF (GAUZE/BANDAGES/DRESSINGS) IMPLANT
BLADE CLIPPER SURG (BLADE) ×3 IMPLANT
BNDG ESMARK 6X9 LF (GAUZE/BANDAGES/DRESSINGS)
CANISTER SUCT 3000ML PPV (MISCELLANEOUS) ×3 IMPLANT
CLIP VESOCCLUDE MED 24/CT (CLIP) ×3 IMPLANT
CLIP VESOCCLUDE SM WIDE 24/CT (CLIP) ×3 IMPLANT
COVER PROBE W GEL 5X96 (DRAPES) ×3 IMPLANT
CUFF TOURN SGL QUICK 24 (TOURNIQUET CUFF)
CUFF TOURN SGL QUICK 34 (TOURNIQUET CUFF)
CUFF TOURN SGL QUICK 42 (TOURNIQUET CUFF) IMPLANT
CUFF TRNQT CYL 24X4X16.5-23 (TOURNIQUET CUFF) IMPLANT
CUFF TRNQT CYL 34X4.125X (TOURNIQUET CUFF) IMPLANT
DERMABOND ADVANCED (GAUZE/BANDAGES/DRESSINGS) ×1
DERMABOND ADVANCED .7 DNX12 (GAUZE/BANDAGES/DRESSINGS) ×2 IMPLANT
DRAIN CHANNEL 15F RND FF W/TCR (WOUND CARE) IMPLANT
DRAPE C-ARM 42X72 X-RAY (DRAPES) ×3 IMPLANT
DRAPE HALF SHEET 40X57 (DRAPES) IMPLANT
ELECT REM PT RETURN 9FT ADLT (ELECTROSURGICAL) ×3
ELECTRODE REM PT RTRN 9FT ADLT (ELECTROSURGICAL) ×2 IMPLANT
EVACUATOR SILICONE 100CC (DRAIN) IMPLANT
GLOVE BIO SURGEON STRL SZ7.5 (GLOVE) ×3 IMPLANT
GLOVE SRG 8 PF TXTR STRL LF DI (GLOVE) ×2 IMPLANT
GLOVE SURG UNDER POLY LF SZ8 (GLOVE) ×1
GOWN STRL REUS W/ TWL LRG LVL3 (GOWN DISPOSABLE) ×4 IMPLANT
GOWN STRL REUS W/ TWL XL LVL3 (GOWN DISPOSABLE) ×4 IMPLANT
GOWN STRL REUS W/TWL LRG LVL3 (GOWN DISPOSABLE) ×2
GOWN STRL REUS W/TWL XL LVL3 (GOWN DISPOSABLE) ×2
GRAFT PROPATEN W/RING 6X80X60 (Vascular Products) ×3 IMPLANT
HEMOSTAT HEMOBLAST BELLOWS (HEMOSTASIS) ×3 IMPLANT
HEMOSTAT SNOW SURGICEL 2X4 (HEMOSTASIS) ×6 IMPLANT
HEMOSTAT SPONGE AVITENE ULTRA (HEMOSTASIS) IMPLANT
INSERT FOGARTY SM (MISCELLANEOUS) ×3 IMPLANT
KIT BASIN OR (CUSTOM PROCEDURE TRAY) ×3 IMPLANT
KIT TURNOVER KIT B (KITS) ×3 IMPLANT
LOOP VESSEL MINI RED (MISCELLANEOUS) ×3 IMPLANT
NS IRRIG 1000ML POUR BTL (IV SOLUTION) ×6 IMPLANT
PACK PERIPHERAL VASCULAR (CUSTOM PROCEDURE TRAY) ×3 IMPLANT
PAD ARMBOARD 7.5X6 YLW CONV (MISCELLANEOUS) ×6 IMPLANT
PATCH VASC XENOSURE 1CMX6CM (Vascular Products) ×1 IMPLANT
PATCH VASC XENOSURE 1X6 (Vascular Products) ×2 IMPLANT
SET MICROPUNCTURE 5F STIFF (MISCELLANEOUS) IMPLANT
SET WALTER ACTIVATION W/DRAPE (SET/KITS/TRAYS/PACK) ×3 IMPLANT
STOPCOCK 4 WAY LG BORE MALE ST (IV SETS) IMPLANT
SUT MNCRL AB 4-0 PS2 18 (SUTURE) ×6 IMPLANT
SUT PROLENE 5 0 C 1 24 (SUTURE) ×30 IMPLANT
SUT PROLENE 6 0 BV (SUTURE) ×24 IMPLANT
SUT SILK 2 0 PERMA HAND 18 BK (SUTURE) ×3 IMPLANT
SUT VIC AB 2-0 CT1 27 (SUTURE) ×4
SUT VIC AB 2-0 CT1 TAPERPNT 27 (SUTURE) ×8 IMPLANT
SUT VIC AB 3-0 SH 27 (SUTURE) ×3
SUT VIC AB 3-0 SH 27X BRD (SUTURE) ×6 IMPLANT
TAPE UMBILICAL COTTON 1/8X30 (MISCELLANEOUS) IMPLANT
TOWEL GREEN STERILE (TOWEL DISPOSABLE) ×3 IMPLANT
TRAY FOLEY MTR SLVR 16FR STAT (SET/KITS/TRAYS/PACK) ×3 IMPLANT
TUBING EXTENTION W/L.L. (IV SETS) IMPLANT
UNDERPAD 30X36 HEAVY ABSORB (UNDERPADS AND DIAPERS) ×3 IMPLANT
WATER STERILE IRR 1000ML POUR (IV SOLUTION) ×3 IMPLANT

## 2021-02-24 NOTE — Anesthesia Procedure Notes (Signed)
Arterial Line Insertion Start/End3/28/2022 9:48 AM, 02/24/2021 9:51 AM Performed by: Darral Dash, DO, anesthesiologist  Patient location: OR. Preanesthetic checklist: patient identified, IV checked, site marked, risks and benefits discussed, surgical consent, monitors and equipment checked, pre-op evaluation, timeout performed and anesthesia consent Lidocaine 1% used for infiltration Left, radial was placed Catheter size: 20 Fr Hand hygiene performed  and maximum sterile barriers used   Attempts: 1 Procedure performed without using ultrasound guided technique. Following insertion, dressing applied. Post procedure assessment: normal and unchanged  Patient tolerated the procedure well with no immediate complications.

## 2021-02-24 NOTE — Anesthesia Procedure Notes (Signed)
Procedure Name: Intubation Date/Time: 02/24/2021 9:47 AM Performed by: Hendricks Limes, CRNA Pre-anesthesia Checklist: Patient identified, Emergency Drugs available, Suction available and Patient being monitored Patient Re-evaluated:Patient Re-evaluated prior to induction Oxygen Delivery Method: Circle system utilized Preoxygenation: Pre-oxygenation with 100% oxygen Induction Type: IV induction Ventilation: Mask ventilation without difficulty and Oral airway inserted - appropriate to patient size Laryngoscope Size: Mac and 3 Grade View: Grade I Tube type: Oral Tube size: 7.0 mm Number of attempts: 1 Airway Equipment and Method: Stylet and Oral airway Placement Confirmation: ETT inserted through vocal cords under direct vision,  positive ETCO2 and breath sounds checked- equal and bilateral Secured at: 21 cm Tube secured with: Tape Dental Injury: Teeth and Oropharynx as per pre-operative assessment

## 2021-02-24 NOTE — Anesthesia Postprocedure Evaluation (Signed)
Anesthesia Post Note  Patient: Sara Warner  Procedure(s) Performed: REDO LEFT COMMON FEMORAL ARTERY EXPOSURE AND LEFT COMMON FEMORAL ARTERY TO BELOW KNEE POPLITEAL ARTERY BYPASS GRAFT WITH PROPATEN VASCULAR GRAFT (Left Leg Upper) REDO LEFT COMMON FEMORAL ENDARTERECTOMY AND PROFUNDAPLASTY WTH BOVINE PATCH (Left Groin)     Patient location during evaluation: PACU Anesthesia Type: General Level of consciousness: awake and alert Pain management: pain level controlled Vital Signs Assessment: post-procedure vital signs reviewed and stable Respiratory status: spontaneous breathing, nonlabored ventilation, respiratory function stable and patient connected to nasal cannula oxygen Cardiovascular status: blood pressure returned to baseline and stable Postop Assessment: no apparent nausea or vomiting Anesthetic complications: no   No complications documented.  Last Vitals:  Vitals:   02/24/21 1630 02/24/21 1645  BP: 99/64 (!) 109/50  Pulse: 87 86  Resp: 15 (!) 23  Temp:  (!) 36.1 C  SpO2: 94% 94%    Last Pain:  Vitals:   02/24/21 1645  TempSrc: Axillary  PainSc:                  March Rummage Nasean Zapf

## 2021-02-24 NOTE — Anesthesia Procedure Notes (Signed)
Arterial Line Insertion Start/End3/28/2022 9:45 AM, 02/24/2021 9:50 AM Performed by: Darral Dash, DO, anesthesiologist  Patient location: OR. Preanesthetic checklist: patient identified, IV checked, site marked, risks and benefits discussed, surgical consent, monitors and equipment checked, pre-op evaluation, timeout performed and anesthesia consent radial was placed Catheter size: 20 Fr Hand hygiene performed  and maximum sterile barriers used   Attempts: 1 Procedure performed without using ultrasound guided technique. Following insertion, dressing applied. Post procedure assessment: normal and unchanged  Patient tolerated the procedure well with no immediate complications.

## 2021-02-24 NOTE — H&P (Signed)
History and Physical Interval Note:  02/24/2021 9:19 AM  Verita Lamb  has presented today for surgery, with the diagnosis of FEMORAL-POPLITEAL BPG OCCLUSION.  The various methods of treatment have been discussed with the patient and family. After consideration of risks, benefits and other options for treatment, the patient has consented to  Procedure(s): REDO LEFT FEMORAL-POPLITEAL ARTERY BYPASS GRAFT (Left) as a surgical intervention.  The patient's history has been reviewed, patient examined, no change in status, stable for surgery.  I have reviewed the patient's chart and labs.  Questions were answered to the patient's satisfaction.    Re-do left fem pop bypass.  BK pop target.  [name]  Vascular and Vein Specialist of [facility]  Patient name: Jeanine Caven          MRN: [id number]        DOB: 1941-04-02        Sex: female    HISOTRY OF PRESENT ILLNESS:    Deshawna Mcneece is a 80 y.o. female Who presented to [facility] last week with a 64-month history of left leg pain.  Ultrasound revealed t less graft.  Hat her left leg bypass graft was occluded.  She is here for further evaluation.  The patient states that she walks with a walker.  She does not have any open wounds.  Patient has a history of a left common femoral endarterectomy with bovine patch angioplasty and left femoral above-knee popliteal artery endarterectomy with femoral to above-knee popliteal bypass with saphenous vein on 06/20/2020.  This was done for tissue loss.  She has healed her wound on her left leg.  The patient is also status post right common femoral to below-knee popliteal artery bypass graft on 12/15/2019 with saphenous vein which was also performed for tissue loss.  On 12/06/2019 she had right external iliac stenting using Elluvia stents to treat inflow stenosis of the bypass graft.  Dr. Carlis Abbott last saw her in November 2021 for renal artery stenosis.  Duplex showed near complete occlusion  of the right renal artery with diffuse cortical atrophy with a 9 cm kidney.  On the left side she had a severe ostial stenosis with a normal-sized kidney measuring approximately 13 cm.  She has had issues with CKD 3.  Her creatinine today is 1.7.   PAST MEDICAL HISTORY:       Past Medical History:  Diagnosis Date  . Atrial fibrillation ([place])   . Chronic atrial fibrillation ([place])    a. Refuses [place].  CHA2DS2VASc = 4.  . Chronic systolic CHF (congestive heart failure) ([place])    a. 05/2015 Echo: EF 35-40%.  . CKD (chronic kidney disease), stage III ([place])   . CML (chronic myelocytic leukemia) ([place]) 01/10/2013  . DM (diabetes mellitus) ([place])   . Dysrhythmia   . Elevated WBC count   . Epistaxis 04/03/2020   AFTER COVID SWAB  . Hypertension   . Hypertensive heart disease   . Hypothyroidism   . NICM (nonischemic cardiomyopathy) ([place])    a. 06/2013 MV: low risk study w/o ischemia;  b. 05/2015 Echo: EF 35-40%, diff HK, basal-midinferoseptal and basal-midanteroseptal AK. Mild-mod MR, mod dil LA, mild to mod TR, PASP 55mmHg.  Marland Kitchen PAD (peripheral artery disease) ([place])   . Pneumonia    30 yrs. ago     FAMILY HISTORY:        Family History  Problem Relation Age of Onset  . Cancer - Colon Mother        mast  to lungs  . Diabetes Father   . Diabetes Paternal Grandmother     SOCIAL HISTORY:   Social History        Tobacco Use  . Smoking status: Former Smoker    Types: Cigarettes    Quit date: 01/01/1984    Years since quitting: 37.1  . Smokeless tobacco: Never Used  Substance Use Topics  . Alcohol use: No     ALLERGIES:        Allergies  Allergen Reactions  . Lisinopril Cough  . Losartan Other (See Comments)    Patient reports nightmares/vivid dreams when taking.     CURRENT MEDICATIONS:   Current Facility-Administered Medications  Medication Dose Route Frequency Provider Last Rate Last Admin  . 0.9 %  sodium chloride  infusion   Intravenous Continuous Serafina Mitchell, MD 100 mL/hr at 02/18/21 0738 New Bag at 02/18/21 [phone]    REVIEW OF SYSTEMS:   [X]  denotes positive finding, [ ]  denotes negative finding Cardiac  Comments:  Chest pain or chest pressure:    Shortness of breath upon exertion:    Short of breath when lying flat:    Irregular heart rhythm:        Vascular    Pain in calf, thigh, or hip brought on by ambulation:    Pain in feet at night that wakes you up from your sleep:     Blood clot in your veins:    Leg swelling:         Pulmonary    Oxygen at home:    Productive cough:     Wheezing:         Neurologic    Sudden weakness in arms or legs:     Sudden numbness in arms or legs:     Sudden onset of difficulty speaking or slurred speech:    Temporary loss of vision in one eye:     Problems with dizziness:         Gastrointestinal    Blood in stool:     Vomited blood:         Genitourinary    Burning when urinating:     Blood in urine:        Psychiatric    Major depression:         Hematologic    Bleeding problems:    Problems with blood clotting too easily:        Skin    Rashes or ulcers:        Constitutional    Fever or chills:      PHYSICAL EXAM:      Vitals:   02/18/21 0709  BP: (!) 150/97  Pulse: (!) 101  Temp: 97.9 F (36.6 C)  TempSrc: Oral  SpO2: 97%  Weight: 81.6 kg  Height: 5\' 8"  (1.727 m)    GENERAL: The patient is a well-nourished female, in no acute distress. The vital signs are documented above. CARDIAC: There is a regular rate and rhythm.  PULMONARY: Non-labored respirations ABDOMEN: Soft and non-tender with normal pitched bowel sounds.  MUSCULOSKELETAL: There are no major deformities or cyanosis. NEUROLOGIC: No focal weakness or paresthesias are detected. SKIN: There are no ulcers or rashes noted. PSYCHIATRIC: The patient has  a normal affect.    MEDICAL ISSUES:   Left leg bypass graft occlusion: We discussed proceeding with angiography and diagnostic imaging to see what her surgical options are.  Renal artery stenosis: The patient has elevated creatinine with severe  bilateral stenosis but an atrophied right renal.  We discussed evaluating her left renal artery stenosis and possibly treating this.    Leia Alf, MD, FACS [facility] and Vein Specialists of [facility] [phone] Pager [phone]

## 2021-02-24 NOTE — Transfer of Care (Signed)
Immediate Anesthesia Transfer of Care Note  Patient: Korene Dula  Procedure(s) Performed: REDO LEFT COMMON FEMORAL ARTERY EXPOSURE AND LEFT COMMON FEMORAL ARTERY TO BELOW KNEE POPLITEAL ARTERY BYPASS GRAFT WITH PROPATEN VASCULAR GRAFT (Left Leg Upper) REDO LEFT COMMON FEMORAL ENDARTERECTOMY AND PROFUNDAPLASTY WTH BOVINE PATCH (Left Groin)  Patient Location: PACU  Anesthesia Type:General  Level of Consciousness: drowsy and responds to stimulation  Airway & Oxygen Therapy: Patient Spontanous Breathing and Patient connected to face mask oxygen  Post-op Assessment: Report given to RN and Post -op Vital signs reviewed and stable  Post vital signs: Reviewed and stable  Last Vitals:  Vitals Value Taken Time  BP 113/48 02/24/21 1442  Temp    Pulse 94 02/24/21 1446  Resp 19 02/24/21 1446  SpO2 99 % 02/24/21 1446  Vitals shown include unvalidated device data.  Last Pain:  Vitals:   02/24/21 0613  TempSrc:   PainSc: 0-No pain         Complications: No complications documented.

## 2021-02-24 NOTE — Op Note (Addendum)
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  Redo exposure of left common femoral artery greater than 30 days 2.  Redo left common femoral endarterectomy with profundoplasty and bovine pericardial patch angioplasty 3.  Left common femoral to below-knee popliteal artery bypass with 6 mm ringed PTFE  Surgeon: Dr. [name], MD  Assistant: [name], PA  Indication: Patient is a 80 year old female who recently presented with worsening rest pain in the left foot now progressed to tissue loss in the left lower extremity.  She was found to have occluded left common femoral artery as well as common femoral to above-knee popliteal bypass after recent arteriogram.  She presents today for redo bypass after risk benefits discussed.  An assistant was needed for exposure and to expedite the case.  Findings: Left common femoral artery was extensively scarred in and the dissestion was difficult given redo groin.  Ultimately, when I opened the left common femoral artery it was completely occluded with recurrent intimal hyperplasia.  Endarterectomy was performed from the distal external iliac onto the profunda.  Bovine pericardial patch was sewn with a palpable femoral pulse.  I then tunneled a 6 mm ringed PTFE graft from the left common femoral artery to the below-knee popliteal artery.  Excellent DP PT signals at completion.  Anesthesia: General  Details: Patient was taken to the operating room after informed consent was obtained.  Placed on the operative table supine position.  General endotracheal anesthesia induced.  Ultimately she got preoperative antibiotics.  Both groins and the left leg was prepped and draped in usual sterile fashion.  A timeout was performed to identify patient,  procedure and site.  Initially made a horizontal groin incision above her left inguinal crease.  This was the same location as her previous incision.  Dissected through the subcutaneous tissue with Bovie cautery and ultimately got down to the femoral sheath that was opened longitudinally.  There was no common femoral pulse given her common femoral was occluded and that she had extensive scar tissue from previous groin surgery.  This was a very tedious dissection and ultimately I was able to dissect out the common femoral artery as well as her previous common femoral to above-knee popliteal bypass and also isolate the SFA and profunda control these with Vesseloops.  I then dissected under the inguinal ligament again this was a tedious dissection and I used a Research officer, political party for added visualization.  Finally I was able to get proximal enough in order to get a Henley clamp for proximal control where there was an appreciable pulse.  That point in time I then went down to the below-knee popliteal artery and made a longitudinal incision on the medial calf one fingerbreadth medial to the tibia.  Dissected through the subcutaneous tissue and ultimately opened the muscle fascia and entered the popliteal fossa.  Meyerding retractors were used for added visualization.  I then was able to isolate out the below-knee popliteal artery dissected away from the veins controlled these with Vesseloops.  I then tunneled a Gore tunneler from the below-knee popliteal exposure up to the groin in subfascial subsartorial plane.  A 6 mm ringed PTFE graft was brought on the field and tunneled ensuring not to twist it.  Patient was given 100 units/kg IV heparin.  I then used a  Henley clamp on the distal external iliac where it was soft and then I controlled SFA profunda with Vesseloops.  The previous bypass was ligated given that this was occluded.  I then reopened the common femoral artery with 11 blade and it was completely occluded with  recurrent intimal hyperplasia/atherosclerotic disease.  A Penfill elevator was used and I performed a endarterectomy of the common femoral onto the distal external iliac and down to the profunda with eversion technique.  I had excellent pulsatile inflow and good backbleeding from the profunda.  Bovine pericardial patch was brought on the field sewn in place using a 5-0 Prolene parachute technique at the artery was de-aired prior to completion.  I then used a Satinsky clamp for partial control of the patch after the patch was sewn.  We had a good femoral pulse at this time.  The patch was then opened with 11 blade scalpel extended with Pott scissors to create an arteriotomy.  I then beveled the graft and the graft was sewn end-to-side anastomosis to the left common femoral artery with 5-0 Prolene parachute technique.  We had excellent pulsatile flow distally in the graft which was then flushed with heparinized saline from the below-knee popliteal exposure and then occluded in the groin.  Straighten the leg and marked the graft to the appropriate length and then got control of the popliteal artery with Vesseloops this was opened with 11 blade scalpel and extended with Potts scissors and then I beveled the graft and performed an end to side anastomosis to the below-knee popliteal artery.  This was done with 6-0 Prolene parachute technique.  We did de-air everything prior to completion.  There was excellent DP PT signal in the left foot that was only present with the graft was patent.  That point time we gave 50 mg protamine.  She was fairly oozy in the groin given the redo operation and from suture holes in the graft.  I used Hemoblast for hemostasis.  Ultimately after we got good hemostasis the groin was closed in multiple layers of 2-0 Vicryl, 3-0 Vicryl, 4-0 Monocryl, and Dermabond.  Below-knee popliteal incision was closed with 3-0 Vicryl 4-0 Monocryl Dermabond.  Excellent signal at completion.  Taken to PACU in  stable condition.  Complication: None  Condition: Stable  [name], MD [facility]ists of [place] Office: [place]

## 2021-02-25 ENCOUNTER — Encounter (HOSPITAL_COMMUNITY): Payer: Self-pay | Admitting: Vascular Surgery

## 2021-02-25 LAB — BASIC METABOLIC PANEL
Anion gap: 7 (ref 5–15)
BUN: 31 mg/dL — ABNORMAL HIGH (ref 8–23)
CO2: 20 mmol/L — ABNORMAL LOW (ref 22–32)
Calcium: 8.6 mg/dL — ABNORMAL LOW (ref 8.9–10.3)
Chloride: 107 mmol/L (ref 98–111)
Creatinine, Ser: 2.11 mg/dL — ABNORMAL HIGH (ref 0.44–1.00)
GFR, Estimated: 23 mL/min — ABNORMAL LOW (ref >60)
Glucose, Bld: 190 mg/dL — ABNORMAL HIGH (ref 70–99)
Potassium: 4.6 mmol/L (ref 3.5–5.1)
Sodium: 134 mmol/L — ABNORMAL LOW (ref 135–145)

## 2021-02-25 LAB — CBC
HCT: 27.9 % — ABNORMAL LOW (ref 36.0–46.0)
Hemoglobin: 9 g/dL — ABNORMAL LOW (ref 12.0–15.0)
MCH: 28.3 pg (ref 26.0–34.0)
MCHC: 32.3 g/dL (ref 30.0–36.0)
MCV: 87.7 fL (ref 80.0–100.0)
Platelets: 373 10*3/uL (ref 150–400)
RBC: 3.18 MIL/uL — ABNORMAL LOW (ref 3.87–5.11)
RDW: 16.6 % — ABNORMAL HIGH (ref 11.5–15.5)
WBC: 16.6 10*3/uL — ABNORMAL HIGH (ref 4.0–10.5)
nRBC: 0 % (ref 0.0–0.2)

## 2021-02-25 LAB — GLUCOSE, CAPILLARY
Glucose-Capillary: 167 mg/dL — ABNORMAL HIGH (ref 70–99)
Glucose-Capillary: 192 mg/dL — ABNORMAL HIGH (ref 70–99)
Glucose-Capillary: 165 mg/dL — ABNORMAL HIGH (ref 70–99)
Glucose-Capillary: 180 mg/dL — ABNORMAL HIGH (ref 70–99)

## 2021-02-25 LAB — LIPID PANEL
Cholesterol: 107 mg/dL (ref 0–200)
HDL: 21 mg/dL — ABNORMAL LOW (ref >40)
LDL Cholesterol: 62 mg/dL (ref 0–99)
Total CHOL/HDL Ratio: 5.1 RATIO
Triglycerides: 122 mg/dL (ref <150)
VLDL: 24 mg/dL (ref 0–40)

## 2021-02-25 MED ORDER — HEPARIN SODIUM (PORCINE) 5000 UNIT/ML IJ SOLN
5000.0000 [IU] | Freq: Three times a day (TID) | INTRAMUSCULAR | Status: DC
  Administered 2021-02-25 – 2021-02-26 (×3): 5000 [IU] via SUBCUTANEOUS
  Filled 2021-02-25 (×3): qty 1

## 2021-02-25 MED ORDER — ROSUVASTATIN CALCIUM 5 MG PO TABS
10.0000 mg | ORAL_TABLET | Freq: Every day | ORAL | Status: DC
  Administered 2021-02-26: 10 mg via ORAL
  Filled 2021-02-25: qty 2

## 2021-02-25 NOTE — Progress Notes (Signed)
Plan of care reviewed. Pt's alert and oriented x 4. Afebrile, on room air, no respiratory distress. Atrial fib on monitor, HR 70s-90s. BP soft after Percocet given at the end of day shift for pain, BP 90/46 -99/54 mmHg from A line and BP cuff. 0.9%NSS 500 ml IV bolus given x 1, then BP 103/54- 121/65 mmHg. Pt was asymptomatic, no distress. Incision on left groin and left leg were dry and clean, no hematoma. We got patent signals with doppler on left and right DP and PT pulses. Pain tolerated well. We continue to monitor.    02/24/21 2000  RLE Neurovascular Assessment  RLE Capillary Refill  Less than/equal to 3 seconds  RLE Color  Appropriate for ethnicity  RLE Temperature/Moisture  Warm;Dry  R Posterior Tibial Pulse Doppler  R Dorsalis Pedis Pulse Doppler  LLE Neurovascular Assessment  LLE Capillary Refill  Less than/equal to 3 seconds  LLE Color  Appropriate for ethnicity  LLE Temperature/Moisture  Warm;Dry  L Posterior Tibial Pulse Doppler  L Dorsalis Pedis Pulse Doppler   Kennyth Lose, RN

## 2021-02-25 NOTE — Progress Notes (Signed)
Mobility Specialist - Progress Note   02/25/21 1448  Mobility  Activity Ambulated to bathroom;Ambulated in room  Level of Assistance Minimal assist, patient does 75% or more  Assistive Device Front wheel walker  Distance Ambulated (ft) 58 ft (14 ft x 2, 30 ft x 1)  Mobility Response Tolerated well  Mobility performed by Mobility specialist  $Mobility charge 1 Mobility   Pre-mobility: 90 HR, 97%SpO2 During mobility: 103 HR Post-mobility: 87 HR, 100% SpO2  Pt min assist to stand from bed and toilet. Frequent verbal cues needed to stay w/in the walker. Pt ambulated to bathroom, then to bed, then to door and to her recliner. She was asx throughout. Pt left in recliner, call bell in reach and chair alarm on.    Pricilla Handler Mobility Specialist Mobility Specialist Phone: [phone]

## 2021-02-25 NOTE — Progress Notes (Addendum)
PHARMACIST LIPID MONITORING   Sara Warner is a 80 y.o. female admitted on 02/24/2021 with PVD.  Pharmacy has been consulted to optimize lipid-lowering therapy with the indication of secondary prevention for clinical ASCVD.  Recent Labs:  Lipid Panel (last 6 months):   Lab Results  Component Value Date   CHOL 107 02/25/2021   TRIG 122 02/25/2021   HDL 21 (L) 02/25/2021   CHOLHDL 5.1 02/25/2021   VLDL 24 02/25/2021   LDLCALC 62 02/25/2021    Hepatic function panel (last 6 months):   Lab Results  Component Value Date   AST 20 02/24/2021   ALT 20 02/24/2021   ALKPHOS 52 02/24/2021   BILITOT 0.9 02/24/2021    SCr (since admission):   Serum creatinine: 2.11 mg/dL (H) 02/25/21 0400 Estimated creatinine clearance: 23.9 mL/min (A)  Current therapy and lipid therapy tolerance Current lipid-lowering therapy: crestor 5mg /day Previous lipid-lowering therapies (if applicable): atorvastatin Documented or reported allergies or intolerances to lipid-lowering therapies (if applicable): myalgia with atorvastation  Assessment:   Patient agrees with changes to lipid-lowering therapy  Plan:    1.Statin intensity (high intensity recommended for all patients regardless of the LDL):  Statin intolerance noted. Discussed with patient, increase statin therapy to crestor 10mg /day.  -unable to increase to high intensity to to kidney function. Not able to use atorvastatin due to history of intolerance  2.Add ezetimibe (if any one of the following):   Not indicated at this time.  3.Refer to lipid clinic:   No  4.Follow-up with:  Primary care provider - Cyndi Bender, PA-C  5.Follow-up labs after discharge:   -LDL at goal. Repeat lipid panel in 1 year  Hildred Laser, PharmD Clinical Pharmacist **Pharmacist phone directory can now be found on [email or web address] ([email or web address]).  Listed under [place].

## 2021-02-25 NOTE — Evaluation (Signed)
Occupational Therapy Evaluation Patient Details Name: Sara Warner MRN: [id number] DOB: 1941/11/30 Today's Date: 02/25/2021    History of Present Illness Patient is a 80 y/o female who presents s/p redo left fem pop bypass graft 3/28 secondary to fem pop BPG occlusion. PMH includes A-fib, CHF, DM, CKD, HTN, PAD, recent left femoral and popliteal endarterectomy 06/20/20.   Clinical Impression   Pt typically mod I for ADL and mobility. Today Pt is min A for boost with transfers, able to ambulate to bathroom min guard with RW, did seated peri care with min guard (front and back), able to perform standing grooming with min guard. Pt max A for LB ADL, and decreased activity tolerance overall, will have assist from 5 daughters at DC. OT will continue to follow acutely and will benefit from [facility] post-acute. Next session to intro AE and energy conservation  Of note: Pt did have a run of elevated HR sustained in the 160's for approx 30 seconds, it stopped once Pt sat down and remained in the 80-low 100's after that for the rest of the session.    Follow Up Recommendations  Home health OT;Supervision/Assistance - 24 hour    Equipment Recommendations  3 in 1 bedside commode    Recommendations for Other Services       Precautions / Restrictions Precautions Precautions: Fall Restrictions Weight Bearing Restrictions: No RLE Weight Bearing: Weight bearing as tolerated LLE Weight Bearing: Weight bearing as tolerated      Mobility Bed Mobility Overal bed mobility: Needs Assistance Bed Mobility: Sit to Supine     Supine to sit: Min assist;HOB elevated Sit to supine: Mod assist   General bed mobility comments: assist for BLE back into bed    Transfers Overall transfer level: Needs assistance Equipment used: Rolling walker (2 wheeled) Transfers: Sit to/from Stand Sit to Stand: Min assist         General transfer comment: Assist to power to standing with cues for hand  placement/technique.    Balance Overall balance assessment: Needs assistance Sitting-balance support: Feet supported;No upper extremity supported Sitting balance-Leahy Scale: Good     Standing balance support: During functional activity Standing balance-Leahy Scale: Poor Standing balance comment: Requires UE support in standing.                           ADL either performed or assessed with clinical judgement   ADL Overall ADL's : Needs assistance/impaired Eating/Feeding: Set up;Sitting   Grooming: Oral care;Wash/dry hands;Min guard;Standing   Upper Body Bathing: Moderate assistance   Lower Body Bathing: Moderate assistance   Upper Body Dressing : Set up   Lower Body Dressing: Moderate assistance;Sit to/from stand   Toilet Transfer: Minimal assistance;Ambulation;RW   Toileting- Clothing Manipulation and Hygiene: Min guard;Sitting/lateral lean Toileting - Clothing Manipulation Details (indicate cue type and reason): front and back     Functional mobility during ADLs: Min guard;Rolling walker (after  initial min A for boost into standing) General ADL Comments: decreased access to LB for ADL     Vision         Perception     Praxis      Pertinent Vitals/Pain Pain Assessment: No/denies pain     Hand Dominance Right   Extremity/Trunk Assessment Upper Extremity Assessment Upper Extremity Assessment: Overall WFL for tasks assessed   Lower Extremity Assessment Lower Extremity Assessment: Defer to PT evaluation LLE Deficits / Details: Limited knee extension due to tightness LLE Sensation: decreased  light touch   Cervical / Trunk Assessment Cervical / Trunk Assessment: Kyphotic   Communication Communication Communication: No difficulties   Cognition Arousal/Alertness: Awake/alert Behavior During Therapy: WFL for tasks assessed/performed Overall Cognitive Status: Within Functional Limits for tasks assessed                                  General Comments: for basic mobility tasks   General Comments  Run of HR in the 160's, brief and then returned to 80-100    Exercises General Exercises - Lower Extremity Ankle Circles/Pumps: AROM;Both;15 reps;Supine Quad Sets: AROM;Both;10 reps;Seated   Shoulder Instructions      Home Living Family/patient expects to be discharged to:: Private residence Living Arrangements: Alone Available Help at Discharge: Family;Available 24 hours/day;Available PRN/intermittently Type of Home: House Home Access: Ramped entrance     Home Layout: One level     Bathroom Shower/Tub: Teacher, early years/pre: Handicapped height     Home Equipment: Environmental consultant - 4 wheels;Cane - single point;Transport chair;Shower seat   Additional Comments: Pt's 5 daughters can provide assist as needed.      Prior Functioning/Environment Level of Independence: Needs assistance  Gait / Transfers Assistance Needed: Uses rollator for ambulation ADL's / Homemaking Assistance Needed: Daughters assist with housework, etc. Pt reports difficulty getting in/out of tub/shower. ASsist for ADLs at times.            OT Problem List: Decreased strength;Decreased range of motion;Decreased activity tolerance;Impaired balance (sitting and/or standing);Decreased safety awareness;Decreased knowledge of use of DME or AE;Cardiopulmonary status limiting activity;Increased edema      OT Treatment/Interventions: Self-care/ADL training;Therapeutic exercise;DME and/or AE instruction;Energy conservation;Therapeutic activities;Patient/family education;Balance training    OT Goals(Current goals can be found in the care plan section) Acute Rehab OT Goals Patient Stated Goal: to go home OT Goal Formulation: With patient Time For Goal Achievement: 03/11/21 Potential to Achieve Goals: Good ADL Goals Pt Will Perform Grooming: with modified independence;standing Pt Will Perform Upper Body Dressing: with modified  independence;sitting Pt Will Perform Lower Body Dressing: with min guard assist;sit to/from stand Pt Will Transfer to Toilet: with modified independence;ambulating Pt Will Perform Toileting - Clothing Manipulation and hygiene: with modified independence;sitting/lateral leans Additional ADL Goal #1: Pt will perform bed mobility at supervision level prior to engaging in ADL  OT Frequency: Min 2X/week   Barriers to D/C:            Co-evaluation              AM-PAC OT "6 Clicks" Daily Activity     Outcome Measure Help from another person eating meals?: None Help from another person taking care of personal grooming?: A Little Help from another person toileting, which includes using toliet, bedpan, or urinal?: A Little Help from another person bathing (including washing, rinsing, drying)?: A Lot Help from another person to put on and taking off regular upper body clothing?: A Little Help from another person to put on and taking off regular lower body clothing?: A Lot 6 Click Score: 17   End of Session Equipment Utilized During Treatment: Gait belt;Rolling walker Nurse Communication: Mobility status;Precautions  Activity Tolerance: Patient tolerated treatment well Patient left: in bed;with call bell/phone within reach;with bed alarm set  OT Visit Diagnosis: Unsteadiness on feet (R26.81);Other abnormalities of gait and mobility (R26.89);Muscle weakness (generalized) (M62.81)  Time: 1012-1056 OT Time Calculation (min): 44 min Charges:  OT General Charges $OT Visit: 1 Visit OT Evaluation $OT Eval Moderate Complexity: 1 Mod OT Treatments $Self Care/Home Management : 8-22 mins $Therapeutic Activity: 8-22 mins  Jesse Sans OTR/L Acute Rehabilitation Services Pager: [phone] Office: [place] 02/25/2021, 12:19 PM

## 2021-02-25 NOTE — Evaluation (Addendum)
Physical Therapy Evaluation Patient Details Name: Sara Warner MRN: [id number] DOB: 10/17/1941 Today's Date: 02/25/2021   History of Present Illness  Patient is a 80 y/o female who presents s/p redo left fem pop bypass graft 3/28 secondary to fem pop BPG occlusion. PMH includes A-fib, CHF, DM, CKD, HTN, PAD, recent left femoral and popliteal endarterectomy 06/20/20.  Clinical Impression  Patient presents with generalized weakness and post surgical deficits s/p above. Pt reports being Mod I with rollator PTA and has 5 supportive daughters that assist with ADLs/IADLs as needed daily. Today, pt requires Min A for bed mobility, transfers and Min guard assist for gait training with use of RW for support. Reports no pain but decreased sensation in LLE. Encouraged walking with nursing daily to bathroom. Noted to have a few episodes of vtach during session but asymptomatic. Instructed pt in there ex. Likely will progress well with increased activity. Will follow acutely to maximize independence and mobility prior to return home.    Follow Up Recommendations Home health PT;Supervision - Intermittent (pending improvement)    Equipment Recommendations  None recommended by PT    Recommendations for Other Services       Precautions / Restrictions Precautions Precautions: Fall Restrictions Weight Bearing Restrictions: No      Mobility  Bed Mobility Overal bed mobility: Needs Assistance Bed Mobility: Supine to Sit     Supine to sit: Min assist;HOB elevated     General bed mobility comments: Pt pulling on therapist's arm to scoot bottom forward to EOB.    Transfers Overall transfer level: Needs assistance Equipment used: Rolling walker (2 wheeled) Transfers: Sit to/from Stand Sit to Stand: Min assist         General transfer comment: Assist to power to standing with cues for hand placement/technique.  Ambulation/Gait Ambulation/Gait assistance: Min guard Gait Distance (Feet): 26  Feet Assistive device: Rolling walker (2 wheeled) Gait Pattern/deviations: Step-through pattern;Decreased stride length;Decreased stance time - left Gait velocity: decreased   General Gait Details: Slow mildly unsteady gait with RW for support.  Stairs            Wheelchair Mobility    Modified Rankin (Stroke Patients Only)       Balance Overall balance assessment: Needs assistance Sitting-balance support: Feet supported;No upper extremity supported Sitting balance-Leahy Scale: Good     Standing balance support: During functional activity Standing balance-Leahy Scale: Poor Standing balance comment: Requires UE support in standing.                             Pertinent Vitals/Pain Pain Assessment: No/denies pain    Home Living Family/patient expects to be discharged to:: Private residence Living Arrangements: Alone Available Help at Discharge: Family;Available 24 hours/day;Available PRN/intermittently Type of Home: House Home Access: Ramped entrance     Home Layout: One level Home Equipment: [place] - 4 wheels;Cane - single point;Transport chair;Shower seat Additional Comments: Pt's 5 daughters can provide assist as needed.    Prior Function Level of Independence: Needs assistance   Gait / Transfers Assistance Needed: Uses rollator for ambulation  ADL's / Homemaking Assistance Needed: Daughters assist with housework, etc. Pt reports difficulty getting in/out of tub/shower. ASsist for ADLs at times.        Hand Dominance   Dominant Hand: Right    Extremity/Trunk Assessment   Upper Extremity Assessment Upper Extremity Assessment: Defer to OT evaluation    Lower Extremity Assessment Lower Extremity Assessment: Generalized weakness;LLE  deficits/detail (but functional) LLE Deficits / Details: Limited knee extension due to tightness LLE Sensation: decreased light touch    Cervical / Trunk Assessment Cervical / Trunk Assessment: Kyphotic   Communication   Communication: No difficulties  Cognition Arousal/Alertness: Awake/alert Behavior During Therapy: WFL for tasks assessed/performed Overall Cognitive Status: Within Functional Limits for tasks assessed                                 General Comments: for basic mobility tasks      General Comments General comments (skin integrity, edema, etc.): Mild swelling noted LLE, incisions not weeping.    Exercises General Exercises - Lower Extremity Ankle Circles/Pumps: AROM;Both;15 reps;Supine Quad Sets: AROM;Both;10 reps;Seated   Assessment/Plan    PT Assessment Patient needs continued PT services  PT Problem List Decreased strength;Decreased mobility;Decreased range of motion;Pain;Impaired sensation;Decreased balance;Decreased skin integrity       PT Treatment Interventions Therapeutic exercise;Gait training;Balance training;Patient/family education;Therapeutic activities;Functional mobility training    PT Goals (Current goals can be found in the Care Plan section)  Acute Rehab PT Goals Patient Stated Goal: to go home PT Goal Formulation: With patient Time For Goal Achievement: 03/11/21 Potential to Achieve Goals: Good    Frequency Min 3X/week   Barriers to discharge        Co-evaluation               AM-PAC PT "6 Clicks" Mobility  Outcome Measure Help needed turning from your back to your side while in a flat bed without using bedrails?: None Help needed moving from lying on your back to sitting on the side of a flat bed without using bedrails?: A Little Help needed moving to and from a bed to a chair (including a wheelchair)?: A Little Help needed standing up from a chair using your arms (e.g., wheelchair or bedside chair)?: A Little Help needed to walk in hospital room?: A Little Help needed climbing 3-5 steps with a railing? : A Little 6 Click Score: 19    End of Session Equipment Utilized During Treatment: Gait belt Activity  Tolerance: Patient tolerated treatment well Patient left: in chair;with call bell/phone within reach;with chair alarm set Nurse Communication: Mobility status PT Visit Diagnosis: Muscle weakness (generalized) (M62.81);Other abnormalities of gait and mobility (R26.89)    Time: 2025-4270 PT Time Calculation (min) (ACUTE ONLY): 24 min   Charges:   PT Evaluation $PT Eval Moderate Complexity: 1 Mod PT Treatments $Gait Training: 8-22 mins        Marisa Severin, PT, DPT Acute Rehabilitation Services Pager [phone] Office [place] 02/25/2021, 9:54 AM

## 2021-02-25 NOTE — Progress Notes (Addendum)
Progress Note    02/25/2021 7:04 AM 1 Day Post-Op  Subjective:  She denies pain and denies SOB or CP. Foley just removed and has not yet voided.   Vitals:   02/25/21 0400 02/25/21 0600  BP: (!) 101/51 (!) 102/47  Pulse: 80 86  Resp: 13 14  Temp:    SpO2: 95% 98%   UOP: 2320 cc  Physical Exam: General appearance: Awake, alert in no apparent distress Cardiac: Heart rate and rhythm are regular Respirations: Nonlabored Incisions: left groin and lower leg incisions are all well approximated without bleeding or hematoma Extremities: Both feet are warm with intact sensation and motor function.  Brisk dorsalis pedis, posterior tibial and artery Doppler signals. Left heel and medial malleolus ischemic skin changes without signs of infection         CBC    Component Value Date/Time   WBC 16.6 (H) 02/25/2021 0400   RBC 3.18 (L) 02/25/2021 0400   HGB 9.0 (L) 02/25/2021 0400   HGB 11.9 (L) 09/25/2020 1147   HGB 13.5 03/22/2018 0957   HGB 13.2 09/08/2017 1417   HCT 27.9 (L) 02/25/2021 0400   HCT 40.4 03/22/2018 0957   HCT 41.0 09/08/2017 1417   PLT 373 02/25/2021 0400   PLT 456 (H) 09/25/2020 1147   PLT 372 03/22/2018 0957   MCV 87.7 02/25/2021 0400   MCV 84 03/22/2018 0957   MCV 86.2 09/08/2017 1417   MCH 28.3 02/25/2021 0400   MCHC 32.3 02/25/2021 0400   RDW 16.6 (H) 02/25/2021 0400   RDW 14.7 03/22/2018 0957   RDW 15.9 (H) 09/08/2017 1417   LYMPHSABS 2.1 09/25/2020 1147   LYMPHSABS 3.6 (H) 09/08/2017 1417   MONOABS 1.0 09/25/2020 1147   MONOABS 0.7 09/08/2017 1417   EOSABS 0.3 09/25/2020 1147   EOSABS 0.2 09/08/2017 1417   BASOSABS 0.2 (H) 09/25/2020 1147   BASOSABS 0.1 09/08/2017 1417    BMET    Component Value Date/Time   NA 134 (L) 02/25/2021 0400   NA 140 03/22/2018 0957   NA 140 09/08/2017 1417   K 4.6 02/25/2021 0400   K 4.0 09/08/2017 1417   CL 107 02/25/2021 0400   CL 106 04/26/2013 1252   CO2 20 (L) 02/25/2021 0400   CO2 24 09/08/2017 1417    GLUCOSE 190 (H) 02/25/2021 0400   GLUCOSE 143 (H) 09/08/2017 1417   GLUCOSE 177 (H) 04/26/2013 1252   BUN 31 (H) 02/25/2021 0400   BUN 28 (H) 03/22/2018 0957   BUN 26.9 (H) 09/08/2017 1417   CREATININE 2.11 (H) 02/25/2021 0400   CREATININE 1.93 (H) 09/25/2020 1147   CREATININE 1.4 (H) 09/08/2017 1417   CALCIUM 8.6 (L) 02/25/2021 0400   CALCIUM 9.5 09/08/2017 1417   GFRNONAA 23 (L) 02/25/2021 0400   GFRNONAA 26 (L) 09/25/2020 1147   GFRAA 29 (L) 06/22/2020 1143   GFRAA 30 (L) 04/22/2020 0941     Intake/Output Summary (Last 24 hours) at 02/25/2021 0704 Last data filed at 02/25/2021 0600 Gross per 24 hour  Intake 5567.56 ml  Output 2920 ml  Net 2647.56 ml    HOSPITAL MEDICATIONS Scheduled Meds: . Chlorhexidine Gluconate Cloth  6 each Topical Daily  . [START ON 02/26/2021] digoxin  0.0625 mg Oral QODAY  . docusate sodium  100 mg Oral Daily  . glipiZIDE  10 mg Oral BID WC  . insulin aspart  0-15 Units Subcutaneous TID WC  . levothyroxine  125 mcg Oral Q0600  . linagliptin  5  mg Oral Daily  . metoprolol  200 mg Oral Daily  . nilotinib  300 mg Oral Q12H  . rosuvastatin  5 mg Oral Daily   Continuous Infusions: . sodium chloride Stopped (02/24/21 2322)  . sodium chloride 100 mL/hr at 02/25/21 0130  .  ceFAZolin (ANCEF) IV 2 g (02/24/21 2142)  . magnesium sulfate bolus IVPB     PRN Meds:.sodium chloride, acetaminophen **OR** acetaminophen, alum & mag hydroxide-simeth, bisacodyl, clorazepate, guaiFENesin-dextromethorphan, hydrALAZINE, labetalol, magnesium sulfate bolus IVPB, metoprolol tartrate, morphine injection, ondansetron, oxyCODONE-acetaminophen, phenol, polyethylene glycol, potassium chloride  Assessment and Plan: POD 1 re-do left femoral to below knee popliteal artery bypass: VSS. Afebrile. Pain controlled. LLE well perfused. Left medial malleolus and heel ischemic skin changes>stable. Hgb stable. Post-op leukocytosis on peri-op prophy abx. No fever  Mobilize  today.  Recent left renal artery stent placement: Scr at baseline. Excellent UOP. DC IVFs.  Hx a. Fib: HR 80s; BP stable   -DVT prophylaxis:  Begin [place] heaprin   [name], PA-C [facility] [phone] 02/25/2021  7:04 AM   I have seen and evaluated the patient. I agree with the PA note as documented above.  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  Incisions look good.  Very brisk DP PT signals in the left foot.  A. fib appears rate controlled this morning.  Creatinine at baseline 2.11 with CKD.  Hemoglobin 9.  Out of bed to chair and ambulate today.  Therapy consults ordered.  [name], MD [facility] [place] Office: [phone]

## 2021-02-26 LAB — GLUCOSE, CAPILLARY
Glucose-Capillary: 198 mg/dL — ABNORMAL HIGH (ref 70–99)
Glucose-Capillary: 167 mg/dL — ABNORMAL HIGH (ref 70–99)

## 2021-02-26 MED ORDER — ROSUVASTATIN CALCIUM 5 MG PO TABS: 10.0000 mg | ORAL_TABLET | Freq: Every day | ORAL | 11 refills | Status: DC

## 2021-02-26 MED ORDER — OXYCODONE-ACETAMINOPHEN 5-325 MG PO TABS: 1.0000 | ORAL_TABLET | Freq: Four times a day (QID) | ORAL | 0 refills | Status: DC | PRN

## 2021-02-26 NOTE — Discharge Instructions (Signed)
 [facility] [place]  Discharge instructions  Lower Extremity Bypass Surgery  Please refer to the following instruction for your post-procedure care. Your surgeon or physician assistant will discuss any changes with you.  Activity  You are encouraged to walk as much as you can. You can slowly return to normal activities during the month after your surgery. Avoid strenuous activity and heavy lifting until your doctor tells you it's OK. Avoid activities such as vacuuming or swinging a golf club. Do not drive until your doctor give the OK and you are no longer taking prescription pain medications. It is also normal to have difficulty with sleep habits, eating and bowel movement after surgery. These will go away with time.  Bathing/Showering  Shower daily after you go home. Do not soak in a bathtub, hot tub, or swim until the incision heals completely.  Incision Care  Clean your incision with mild soap and water. Shower every day. Pat the area dry with a clean towel. You do not need a bandage unless otherwise instructed. Do not apply any ointments or creams to your incision. If you have open wounds you will be instructed how to care for them or a visiting nurse may be arranged for you. If you have staples or sutures along your incision they will be removed at your post-op appointment. You may have skin glue on your incision. Do not peel it off. It will come off on its own in about one week.  Wash the groin wound with soap and water daily and pat dry. (No tub bath-only shower)  Then put a dry gauze or washcloth in the groin to keep this area dry to help prevent wound infection.  Do this daily and as needed.  Do not use Vaseline or neosporin on your incisions.  Only use soap and water on your incisions and then protect and keep dry.  Diet  Resume your normal diet. There are no special food restrictions following this procedure. A low fat/ low cholesterol diet is  recommended for all patients with vascular disease. In order to heal from your surgery, it is CRITICAL to get adequate nutrition. Your body requires vitamins, minerals, and protein. Vegetables are the best source of vitamins and minerals. Vegetables also provide the perfect balance of protein. Processed food has little nutritional value, so try to avoid this.  Medications  Resume taking all your medications unless your doctor or physician assistant tells you not to. If your incision is causing pain, you may take over-the-counter pain relievers such as acetaminophen (Tylenol). If you were prescribed a stronger pain medication, please aware these medication can cause nausea and constipation. Prevent nausea by taking the medication with a snack or meal. Avoid constipation by drinking plenty of fluids and eating foods with high amount of fiber, such as fruits, vegetables, and grains. Take Colace 100 mg (an over-the-counter stool softener) twice a day as needed for constipation.  Do not take Tylenol if you are taking prescription pain medications.  Follow Up  Our office will schedule a follow up appointment 2-3 weeks following discharge.  Please call us immediately for any of the following conditions  Severe or worsening pain in your legs or feet while at rest or while walking Increase pain, redness, warmth, or drainage (pus) from your incision site(s) Fever of 101 degree or higher The swelling in your leg with the bypass suddenly worsens and becomes more painful than when you were in the hospital If you have   been instructed to feel your graft pulse then you should do so every day. If you can no longer feel this pulse, call the office immediately. Not all patients are given this instruction.  Leg swelling is common after leg bypass surgery.  The swelling should improve over a few months following surgery. To improve the swelling, you may elevate your legs above the level of your heart while you are  sitting or resting. Your surgeon or physician assistant may ask you to apply an ACE wrap or wear compression (TED) stockings to help to reduce swelling.  Reduce your risk of vascular disease  Stop smoking. If you would like help call [facility] at 1-800-QUIT-NOW (1-800-784-8669) or [place] at 336-586-4000.  Manage your cholesterol Maintain a desired weight Control your diabetes weight Control your diabetes Keep your blood pressure down  If you have any questions, please call the office at [phone]  

## 2021-02-26 NOTE — Progress Notes (Signed)
Physical Therapy Treatment Patient Details Name: Sara Warner MRN: [id number] DOB: 1940/12/13 Today's Date: 02/26/2021    History of Present Illness Patient is a 80 y/o female who presents s/p redo left fem pop bypass graft 3/28 secondary to fem pop BPG occlusion. PMH includes A-fib, CHF, DM, CKD, HTN, PAD, recent left femoral and popliteal endarterectomy 06/20/20.    PT Comments    Pt received in chair, agreeable to therapy session with encouragement and with good participation and tolerance for household distance gait trial in room. Pt very unsafe with rollator (this is the type of device she uses at home) and tends to abandon it prior to reaching chair, pt instructed extensively on safe use of assistive device with transfers/gait but would benefit from using RW until pain decreased/cognition improves, RN/case manager notified and pt agreeable to using RW at home. Pt VSS aside from brief HR spike to 132 bpm as she fatigued. She reports 5/10 modified RPE after gait and continues to need physical assist to stand safely from recliner chair, anticipate she will need initial strict 24/7 Supervision/assist at home, discussed with PT Bunnie Philips.   Follow Up Recommendations  Home health PT;Supervision/Assistance - 24 hour     Equipment Recommendations  Rolling walker with 5" wheels (pt unsafe with rollator, MD/Case Mgr notified)    Recommendations for Other Services       Precautions / Restrictions Precautions Precautions: Fall Restrictions Weight Bearing Restrictions: No RLE Weight Bearing: Weight bearing as tolerated LLE Weight Bearing: Weight bearing as tolerated    Mobility  Bed Mobility Overal bed mobility: Needs Assistance        General bed mobility comments: up in chair pre/post session    Transfers Overall transfer level: Needs assistance Equipment used: 4-wheeled walker Transfers: Sit to/from Stand Sit to Stand: Min assist;Mod assist         General transfer  comment: Assist to power to standing with cues for hand placement/technique each attempt; poor eccentric control to sit; cues not to abandon AD  Ambulation/Gait Ambulation/Gait assistance: Min guard Gait Distance (Feet): 50 Feet (108ft, seated break, 15 ft) Assistive device: 4-wheeled walker Gait Pattern/deviations: Step-to pattern;Decreased stance time - left;Decreased step length - right;Antalgic;Trunk flexed Gait velocity: decreased Gait velocity interpretation: <1.8 ft/sec, indicate of risk for recurrent falls General Gait Details: slow, unsteady gait using rollator, tends to abandon device 3-5 ft prior to sitting, needs max cues for proximity to RW and using UE to offload painful L leg; some bleeding at bottom of incision after gait, RN/NT notified   Stairs Stairs:  (no, she has a ramp)     Wheelchair Mobility    Modified Rankin (Stroke Patients Only)       Balance Overall balance assessment: Needs assistance Sitting-balance support: Feet supported;No upper extremity supported Sitting balance-Leahy Scale: Good     Standing balance support: During functional activity Standing balance-Leahy Scale: Poor Standing balance comment: Requires UE support in standing and min guard                 Cognition Arousal/Alertness: Awake/alert Behavior During Therapy: WFL for tasks assessed/performed Overall Cognitive Status: No family/caregiver present to determine baseline cognitive functioning            General Comments: pt with poor carryover of safety cues for hand placement with transfers and needs reinforcement each attempt; poor safety with rollator and abandons it prior to sitting, would recommend consistent supervision from family for all mobility tasks and RW due to unsafe  with rollator      Exercises General Exercises - Lower Extremity Ankle Circles/Pumps: AROM;Both;15 reps;Supine    General Comments General comments (skin integrity, edema, etc.): Brief run of  HR to 132 bpm at end of gait trial, then returned to 100 bpm      Pertinent Vitals/Pain Pain Assessment: 0-10 Pain Score: 5  Pain Location: LLE Pain Descriptors / Indicators: Discomfort;Grimacing Pain Intervention(s): Limited activity within patient's tolerance;Monitored during session;Premedicated before session;Repositioned           PT Goals (current goals can now be found in the care plan section) Acute Rehab PT Goals Patient Stated Goal: to go home PT Goal Formulation: With patient Time For Goal Achievement: 03/11/21 Potential to Achieve Goals: Good Progress towards PT goals: Progressing toward goals    Frequency    Min 3X/week      PT Plan Current plan remains appropriate;Equipment recommendations need to be updated       AM-PAC PT "6 Clicks" Mobility   Outcome Measure  Help needed turning from your back to your side while in a flat bed without using bedrails?: None Help needed moving from lying on your back to sitting on the side of a flat bed without using bedrails?: A Little Help needed moving to and from a bed to a chair (including a wheelchair)?: A Little Help needed standing up from a chair using your arms (e.g., wheelchair or bedside chair)?: A Lot Help needed to walk in hospital room?: A Little Help needed climbing 3-5 steps with a railing? : A Lot 6 Click Score: 17    End of Session Equipment Utilized During Treatment: Gait belt Activity Tolerance: Patient tolerated treatment well Patient left: in chair;with call bell/phone within reach;with chair alarm set Nurse Communication: Mobility status;Other (comment) (needs RW for DC and LLE bleeding slightly) PT Visit Diagnosis: Muscle weakness (generalized) (M62.81);Other abnormalities of gait and mobility (R26.89)     Time: 1100-1130 PT Time Calculation (min) (ACUTE ONLY): 30 min  Charges:  $Gait Training: 8-22 mins $Therapeutic Activity: 8-22 mins                     Sara Warner P., PTA Acute  Rehabilitation Services Pager: [phone] Office: [place] 02/26/2021, 11:47 AM

## 2021-02-26 NOTE — TOC Transition Note (Addendum)
Transition of Care (TOC) - CM/SW Discharge Note Marvetta Gibbons RN, BSN Transitions of Care Unit 4E- RN Case Manager See Treatment Team for direct phone #    Patient Details  Name: Sara Warner MRN: [id number] Date of Birth: [birth year]  Transition of Care [facility]) CM/SW Contact:  Dawayne Patricia, RN Phone Number: 02/26/2021, 11:53 AM   Clinical Narrative:    Noted pt stable for transition home today. Orders have been placed for [facility] and DME needs.  CM in to speak with pt at bedside. Per pt she has needed DME at home - including rollator, lift toilet seat, shower chair. Declines needs for 3n1 at this time.  Family to transport home.   Discussed HH needs- per pt she is agreeable to [facility] services- list provided to pt for [facility] choice Per CMS guidelines from medicare.gov website with star ratings (copy placed in shadow chart), per pt she would like to use [facility] for home Health needs as first choice- pt states if they are unable to assist then she does not have any further preference and will defer to this writer to secure an agency on her behalf that is in network with her HTA insurance.  Pt is active with [facility]- she reports she has [facility] services that follow up with her at home. These services are separate from [facility] services.   Address, phone #s and PCP all confirmed with pt in epic.   Call made to [facility] with [facility]- however they are unable to accept [facility] referral due to they are not servicing pt's area at this time.  Call made to Amy with [facility] to see if they can accept- referral pending.  [facility] unable to accept [facility]- unable to accept- [facility]- no HTA contract currently [facility]- unable to accept at this time [facility] unable to accept due to staffing [facility]- accepted referral- call made to pt to let her know that [facility] would be calling her within the next 1-2 days to schedule start of care visit. Pt voiced understanding and appreciation.   1130- received notice from PT that  pt needs a RW which they feel would be safer than her rollator at this time. Call made to [facility] with [facility] to see if insurance will cover RW. If RW is covered under insurance they will deliver to room prior to discharge- if it is not- they will contact pt to see if she wants to pay for RW out of pocket.    Final next level of care: [place] Barriers to Discharge: No Barriers Identified   Patient Goals and CMS Choice Patient states their goals for this hospitalization and ongoing recovery are:: "return home and do the best that I can" CMS Medicare.gov Compare Post Acute Care list provided to:: Patient Choice offered to / list presented to : Patient  Discharge Placement                 Home with [facility]      Discharge Plan and Services   Discharge Planning Services: CM Consult Post Acute Care Choice: [place]          DME Arranged: 3-N-1,Walker rolling DME Agency: [facility] Date DME Agency Contacted: 02/26/21 Time DME Agency Contacted: [id number] Representative spoke with at DME Agency: [place] Arranged: PT,OT   Date [place]: 02/26/21 Time [place]: 67 Representative spoke with at [place]: Amy  Social Determinants of Health ([place]) Interventions     Readmission Risk Interventions  Readmission Risk Prevention Plan 02/26/2021 01/12/2020  Transportation Screening Complete Complete  PCP or Specialist Appt within 5-7 Days Complete -  PCP or Specialist Appt within 3-5 Days - Complete  Home Care Screening Complete -  Medication Review (RN CM) Complete -  HRI or [place] - Complete  Social Work Consult for [place] Planning/Counseling - Complete  Palliative Care Screening - Not Applicable  Medication Review Press photographer) - Complete  Some recent data might be hidden

## 2021-02-26 NOTE — Progress Notes (Addendum)
Progress Note    02/26/2021 7:53 AM 2 Days Post-Op  Subjective: states very uncomfortable here and eager to go home. Has not had BM despite laxatives and suppositories   Vitals:   02/26/21 0400 02/26/21 0729  BP: (!) 107/52 (!) 119/58  Pulse: 95 96  Resp: 20 20  Temp: (!) 97.4 F (36.3 C) 97.6 F (36.4 C)  SpO2: 96% 98%   Physical Exam: Cardiac: irregularly irregular Lungs:  Non labored Incisions:  Left lower extremity incision is clean, dry and intact. Left groin incision clean, dry and intact without swelling or hematoma Extremities: well perfused and warm with Doppler Dp/ PT peroneal signals. Left heel and medial malleolus ischemic skin changes stable Abdomen:  Obese, soft nontender Neurologic: alert and oriented  CBC    Component Value Date/Time   WBC 16.6 (H) 02/25/2021 0400   RBC 3.18 (L) 02/25/2021 0400   HGB 9.0 (L) 02/25/2021 0400   HGB 11.9 (L) 09/25/2020 1147   HGB 13.5 03/22/2018 0957   HGB 13.2 09/08/2017 1417   HCT 27.9 (L) 02/25/2021 0400   HCT 40.4 03/22/2018 0957   HCT 41.0 09/08/2017 1417   PLT 373 02/25/2021 0400   PLT 456 (H) 09/25/2020 1147   PLT 372 03/22/2018 0957   MCV 87.7 02/25/2021 0400   MCV 84 03/22/2018 0957   MCV 86.2 09/08/2017 1417   MCH 28.3 02/25/2021 0400   MCHC 32.3 02/25/2021 0400   RDW 16.6 (H) 02/25/2021 0400   RDW 14.7 03/22/2018 0957   RDW 15.9 (H) 09/08/2017 1417   LYMPHSABS 2.1 09/25/2020 1147   LYMPHSABS 3.6 (H) 09/08/2017 1417   MONOABS 1.0 09/25/2020 1147   MONOABS 0.7 09/08/2017 1417   EOSABS 0.3 09/25/2020 1147   EOSABS 0.2 09/08/2017 1417   BASOSABS 0.2 (H) 09/25/2020 1147   BASOSABS 0.1 09/08/2017 1417    BMET    Component Value Date/Time   NA 134 (L) 02/25/2021 0400   NA 140 03/22/2018 0957   NA 140 09/08/2017 1417   K 4.6 02/25/2021 0400   K 4.0 09/08/2017 1417   CL 107 02/25/2021 0400   CL 106 04/26/2013 1252   CO2 20 (L) 02/25/2021 0400   CO2 24 09/08/2017 1417   GLUCOSE 190 (H)  02/25/2021 0400   GLUCOSE 143 (H) 09/08/2017 1417   GLUCOSE 177 (H) 04/26/2013 1252   BUN 31 (H) 02/25/2021 0400   BUN 28 (H) 03/22/2018 0957   BUN 26.9 (H) 09/08/2017 1417   CREATININE 2.11 (H) 02/25/2021 0400   CREATININE 1.93 (H) 09/25/2020 1147   CREATININE 1.4 (H) 09/08/2017 1417   CALCIUM 8.6 (L) 02/25/2021 0400   CALCIUM 9.5 09/08/2017 1417   GFRNONAA 23 (L) 02/25/2021 0400   GFRNONAA 26 (L) 09/25/2020 1147   GFRAA 29 (L) 06/22/2020 1143   GFRAA 30 (L) 04/22/2020 0941    INR    Component Value Date/Time   INR 1.1 02/24/2021 0545     Intake/Output Summary (Last 24 hours) at 02/26/2021 0753 Last data filed at 02/26/2021 0617 Gross per 24 hour  Intake 750 ml  Output --  Net 750 ml     Assessment/Plan:  80 y.o. female is s/pre-do left femoral to below knee popliteal artery bypass 2 Days Post-Op. Lower extremities well perfused and warm with Doppler Dp/ PT and peroneal signals.  Left medial malleolus and heel ischemic skin changes unchanged.VSS. Afebrile. Pain controlled. Morning labs pending.  Very constipated. Suppository given this morning will see if this works Advertising copywriter  mobilization Hx a. Fib: HR 80s; BP stable  PT/ OT recommending HH. Patient says she has all home DME needs. Will place order for home health services Possible D/C tomorrow  DVT prophylaxis: sq heparin   Karoline Caldwell, PA-C [facility] [phone] 02/26/2021 7:53 AM   I have seen and evaluated the patient. I agree with the PA note as documented above.  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  She has very brisk Doppler signals in the left foot.  All of her incisions look good.  States her foot feels a lot better.  She has walked with therapy and been cleared.  She wants to go home today.  She feels she has support at home.  Plan discharge today.  Will arrange follow-up in 2  to 3 weeks for wound checks in the clinic.  Will need aspirin and statin which she is already taking.  She knows to call with questions or concerns.  She has chronic A. fib and is rate controlled.  [name], MD [facility] [place] Office: [phone]

## 2021-02-26 NOTE — Plan of Care (Signed)
Plan of care is reviewed. Pt has been progressing.   Problem: Clinical Measurements: Goal: Will remain free from infection Outcome: Progressing: afebrile. Incision on left leg is dry and clean, no signs of infection, no drainage.   Problem: Clinical Measurements: Goal: Diagnostic test results will improve Outcome: Progressing   Problem: Clinical Measurements:  Goal: Respiratory complications will improve Outcome: Progressing: no respiratory distress noted. Lungs clear auscultated bilaterally.   Problem: Clinical Measurements: Chronic Atrial fib. Goal: Cardiovascular complication will be avoided Outcome: Progressing: HR is under controlled. BP stable.   Problem: Activity: Post redo Fem-pop on left leg. Able to walk with walker and one assistance. Tolerated well. Goal: Risk for activity intolerance will decrease Outcome: Progressing   Problem: Elimination: Goal: Will not experience complications related to bowel motility Outcome: Progressing   Problem: Elimination: constipation, last BM 02/21/21. Miralax an dulcolax given. . She usually has BM every day. Goal: Will not experience complications related to urinary retention Outcome: Progressing; She had passed flatus, but unable to have BM. We will monitor. Encouraged to drink more fluid and more fiber from fruit an vegetables.   Problem: Pain Managment: Denied pain. Goal: General experience of comfort will improve Outcome: Progressing:   Kennyth Lose, RN

## 2021-02-26 NOTE — Progress Notes (Signed)
At 1320 Discharge instructions reviewed with pt and her daughter.  Copy of instructions given to pt, and informed script sent to her pharmacy.  Pt getting dressed and will call desk when dressed and ready.  At 1340 pt ready to go, daughter left to get car.   At 70  Pt d/c'd via wheelchair with belongings, (with daughter)            Escorted by hospital volunteer.

## 2021-02-26 NOTE — Progress Notes (Signed)
Mobility Specialist: Progress Note   02/26/21 1342  Mobility  Activity  (Cancel)   Pt is about to discharge.   [facility] Rex Magee Mobility Specialist Mobility Specialist Phone: [phone]

## 2021-03-03 DIAGNOSIS — I5022 Chronic systolic (congestive) heart failure: Secondary | ICD-10-CM | POA: Diagnosis not present

## 2021-03-03 DIAGNOSIS — I482 Chronic atrial fibrillation, unspecified: Secondary | ICD-10-CM | POA: Diagnosis not present

## 2021-03-03 DIAGNOSIS — C9210 Chronic myeloid leukemia, BCR/ABL-positive, not having achieved remission: Secondary | ICD-10-CM | POA: Diagnosis not present

## 2021-03-03 DIAGNOSIS — N183 Chronic kidney disease, stage 3 unspecified: Secondary | ICD-10-CM | POA: Diagnosis not present

## 2021-03-03 DIAGNOSIS — I130 Hypertensive heart and chronic kidney disease with heart failure and stage 1 through stage 4 chronic kidney disease, or unspecified chronic kidney disease: Secondary | ICD-10-CM | POA: Diagnosis not present

## 2021-03-03 DIAGNOSIS — Z87891 Personal history of nicotine dependence: Secondary | ICD-10-CM | POA: Diagnosis not present

## 2021-03-03 DIAGNOSIS — I428 Other cardiomyopathies: Secondary | ICD-10-CM | POA: Diagnosis not present

## 2021-03-03 DIAGNOSIS — E1122 Type 2 diabetes mellitus with diabetic chronic kidney disease: Secondary | ICD-10-CM | POA: Diagnosis not present

## 2021-03-03 DIAGNOSIS — Z9181 History of falling: Secondary | ICD-10-CM | POA: Diagnosis not present

## 2021-03-03 DIAGNOSIS — I081 Rheumatic disorders of both mitral and tricuspid valves: Secondary | ICD-10-CM | POA: Diagnosis not present

## 2021-03-03 DIAGNOSIS — E039 Hypothyroidism, unspecified: Secondary | ICD-10-CM | POA: Diagnosis not present

## 2021-03-03 DIAGNOSIS — Z7984 Long term (current) use of oral hypoglycemic drugs: Secondary | ICD-10-CM | POA: Diagnosis not present

## 2021-03-03 DIAGNOSIS — Z79899 Other long term (current) drug therapy: Secondary | ICD-10-CM | POA: Diagnosis not present

## 2021-03-03 DIAGNOSIS — Z7982 Long term (current) use of aspirin: Secondary | ICD-10-CM | POA: Diagnosis not present

## 2021-03-03 DIAGNOSIS — T82898D Other specified complication of vascular prosthetic devices, implants and grafts, subsequent encounter: Secondary | ICD-10-CM | POA: Diagnosis not present

## 2021-03-03 DIAGNOSIS — L89626 Pressure-induced deep tissue damage of left heel: Secondary | ICD-10-CM | POA: Diagnosis not present

## 2021-03-03 DIAGNOSIS — Z8701 Personal history of pneumonia (recurrent): Secondary | ICD-10-CM | POA: Diagnosis not present

## 2021-03-03 DIAGNOSIS — E1151 Type 2 diabetes mellitus with diabetic peripheral angiopathy without gangrene: Secondary | ICD-10-CM | POA: Diagnosis not present

## 2021-03-06 ENCOUNTER — Telehealth: Payer: Self-pay

## 2021-03-06 DIAGNOSIS — E1151 Type 2 diabetes mellitus with diabetic peripheral angiopathy without gangrene: Secondary | ICD-10-CM | POA: Diagnosis not present

## 2021-03-06 DIAGNOSIS — E1122 Type 2 diabetes mellitus with diabetic chronic kidney disease: Secondary | ICD-10-CM | POA: Diagnosis not present

## 2021-03-06 DIAGNOSIS — I509 Heart failure, unspecified: Secondary | ICD-10-CM | POA: Diagnosis not present

## 2021-03-06 DIAGNOSIS — D649 Anemia, unspecified: Secondary | ICD-10-CM | POA: Diagnosis not present

## 2021-03-06 DIAGNOSIS — R630 Anorexia: Secondary | ICD-10-CM | POA: Diagnosis not present

## 2021-03-06 DIAGNOSIS — K922 Gastrointestinal hemorrhage, unspecified: Secondary | ICD-10-CM | POA: Diagnosis not present

## 2021-03-06 DIAGNOSIS — I4891 Unspecified atrial fibrillation: Secondary | ICD-10-CM | POA: Diagnosis not present

## 2021-03-06 DIAGNOSIS — E039 Hypothyroidism, unspecified: Secondary | ICD-10-CM | POA: Diagnosis not present

## 2021-03-06 DIAGNOSIS — N183 Chronic kidney disease, stage 3 unspecified: Secondary | ICD-10-CM | POA: Diagnosis not present

## 2021-03-06 DIAGNOSIS — R531 Weakness: Secondary | ICD-10-CM | POA: Diagnosis not present

## 2021-03-06 DIAGNOSIS — D72829 Elevated white blood cell count, unspecified: Secondary | ICD-10-CM | POA: Diagnosis not present

## 2021-03-06 DIAGNOSIS — I517 Cardiomegaly: Secondary | ICD-10-CM | POA: Diagnosis not present

## 2021-03-06 NOTE — Discharge Summary (Signed)
Discharge Summary     Sara Warner [birth year] 80 y.o. female  [id number]  Admission Date: 02/24/2021  Discharge Date: 02/26/2021  Physician: No att. providers found  Admission Diagnosis: PAD (peripheral artery disease) ([place]) [I73.9]  HPI:   This is a 80 y.o. female Who presented to [facility] last week with a 63-month history of left leg pain.Ultrasound revealed t less graft. Hat her left leg bypass graft was occluded. She is here for further evaluation. The patient states that she walks with a walker. She does not have any open wounds.  Patient has a history of a left common femoral endarterectomy with bovine patch angioplasty and left femoral above-knee popliteal artery endarterectomy with femoral to above-knee popliteal bypass with saphenous vein on 06/20/2020. This was done for tissue loss. She has healed her wound on her left leg. The patient is also status post right common femoral to below-knee popliteal artery bypass graft on 12/15/2019 with saphenous vein which was also performed for tissue loss. On 12/06/2019 she had right external iliac stenting using Elluviastents to treat inflow stenosis of the bypass graft.  Dr. Carlis Abbott last saw her in November 2021 for renal artery stenosis. Duplex showed near complete occlusion of the right renal artery with diffuse cortical atrophy with a 9 cm kidney. On the left side she had a severe ostial stenosis with a normal-sized kidney measuring approximately 13 cm. She has had issues with CKD 3. Her creatinine today is 1.7.  Hospital Course:  The patient was admitted to the hospital and taken to the operating room on 02/24/2021 and underwent: 1.  Redo exposure of left common femoral artery greater than 30 days 2.  Redo left common femoral endarterectomy with profundoplasty and bovine pericardial patch angioplasty 3.  Left common femoral to below-knee popliteal artery bypass with 6 mm ringed PTFE    Findings: Left common  femoral artery was extensively scarred in and the dissestion was difficult given redo groin.  Ultimately, when I opened the left common femoral artery it was completely occluded with recurrent intimal hyperplasia.  Endarterectomy was performed from the distal external iliac onto the profunda.  Bovine pericardial patch was sewn with a palpable femoral pulse.  I then tunneled a 6 mm ringed PTFE graft from the left common femoral artery to the below-knee popliteal artery.  Excellent DP PT signals at completion.  The pt tolerated the procedure well and was transported to the PACU in good condition.   By POD 1, pain was controlled and LLE well perfused.  Left medial malleolus and heel ischemic skin changes>stable. Hgb stable. Post-op leukocytosis on peri-op prophy abx. No fever.  Recent left renal artery stent placement: Scr at baseline. Excellent UOP. DC IVFs.  POD 2, She has very brisk Doppler signals in the left foot.  All of her incisions look good.  States her foot feels a lot better.  She has walked with therapy and been cleared.  She wants to go home today.  She feels she has support at home.  Plan discharge today.  Will arrange follow-up in 2 to 3 weeks for wound checks in the clinic.  Will need aspirin and statin which she is already taking.  She knows to call with questions or concerns.  She has chronic A. fib and is rate controlled.    CBC    Component Value Date/Time   WBC 16.6 (H) 02/25/2021 0400   RBC 3.18 (L) 02/25/2021 0400   HGB 9.0 (L) 02/25/2021 0400   HGB  11.9 (L) 09/25/2020 1147   HGB 13.5 03/22/2018 0957   HGB 13.2 09/08/2017 1417   HCT 27.9 (L) 02/25/2021 0400   HCT 40.4 03/22/2018 0957   HCT 41.0 09/08/2017 1417   PLT 373 02/25/2021 0400   PLT 456 (H) 09/25/2020 1147   PLT 372 03/22/2018 0957   MCV 87.7 02/25/2021 0400   MCV 84 03/22/2018 0957   MCV 86.2 09/08/2017 1417   MCH 28.3 02/25/2021 0400   MCHC 32.3 02/25/2021 0400   RDW 16.6 (H) 02/25/2021 0400   RDW 14.7  03/22/2018 0957   RDW 15.9 (H) 09/08/2017 1417   LYMPHSABS 2.1 09/25/2020 1147   LYMPHSABS 3.6 (H) 09/08/2017 1417   MONOABS 1.0 09/25/2020 1147   MONOABS 0.7 09/08/2017 1417   EOSABS 0.3 09/25/2020 1147   EOSABS 0.2 09/08/2017 1417   BASOSABS 0.2 (H) 09/25/2020 1147   BASOSABS 0.1 09/08/2017 1417    BMET    Component Value Date/Time   NA 134 (L) 02/25/2021 0400   NA 140 03/22/2018 0957   NA 140 09/08/2017 1417   K 4.6 02/25/2021 0400   K 4.0 09/08/2017 1417   CL 107 02/25/2021 0400   CL 106 04/26/2013 1252   CO2 20 (L) 02/25/2021 0400   CO2 24 09/08/2017 1417   GLUCOSE 190 (H) 02/25/2021 0400   GLUCOSE 143 (H) 09/08/2017 1417   GLUCOSE 177 (H) 04/26/2013 1252   BUN 31 (H) 02/25/2021 0400   BUN 28 (H) 03/22/2018 0957   BUN 26.9 (H) 09/08/2017 1417   CREATININE 2.11 (H) 02/25/2021 0400   CREATININE 1.93 (H) 09/25/2020 1147   CREATININE 1.4 (H) 09/08/2017 1417   CALCIUM 8.6 (L) 02/25/2021 0400   CALCIUM 9.5 09/08/2017 1417   GFRNONAA 23 (L) 02/25/2021 0400   GFRNONAA 26 (L) 09/25/2020 1147   GFRAA 29 (L) 06/22/2020 1143   GFRAA 30 (L) 04/22/2020 0941     Discharge Instructions    Discharge patient   Complete by: As directed    Dc once HH needs arranged.   Discharge disposition: 01-Home or Self Care   Discharge patient date: 02/26/2021      Discharge Diagnosis:  PAD (peripheral artery disease) ([place]) [I73.9]  Secondary Diagnosis: Patient Active Problem List   Diagnosis Date Noted  . Renal artery stenosis ([place]) 10/22/2020  . Peripheral arterial disease ([place]) 06/20/2020  . Iron deficiency anemia 04/22/2020  . Epistaxis   . Occult blood in stools   . Antiplatelet or antithrombotic long-term use   . Symptomatic anemia 04/03/2020  . Infection of graft ([place]) 01/10/2020  . PAD (peripheral artery disease) ([place]) 12/15/2019  . Critical lower limb ischemia ([place]) 11/28/2019  . Bilateral impacted cerumen 06/23/2017  . 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 Burning tongue  07/17/2016  . 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 NICM (nonischemic cardiomyopathy) ([place])   . Chronic atrial fibrillation ([place])   . Hypertensive heart disease   . Stage III chronic kidney disease ([place]) 10/02/2015  . Controlled type 2 diabetes mellitus without complication ([place]) 78/29/5621  . 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 Atrial fibrillation ([place]) 01/05/2012  . Hypothyroid 01/05/2012  . Cardiomyopathy ([place]) 01/05/2012   Past Medical History:  Diagnosis Date  . Atrial fibrillation ([place])   . Chronic atrial fibrillation ([place])    a. Refuses [place].  CHA2DS2VASc = 4.  . Chronic systolic CHF (congestive heart failure) ([place])    a. 05/2015 Echo: EF 35-40%.  . CKD (chronic kidney disease), stage III ([place])   .  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 DM (diabetes mellitus) ([place])   . Dysrhythmia   . Elevated WBC count   . Epistaxis 04/03/2020   AFTER COVID SWAB  . Hypertension   . Hypertensive heart disease   . Hypothyroidism   . NICM (nonischemic cardiomyopathy) ([place])    a. 06/2013 MV: low risk study w/o ischemia;  b. 05/2015 Echo: EF 35-40%, diff HK, basal-midinferoseptal and basal-midanteroseptal AK. Mild-mod MR, mod dil LA, mild to mod TR, PASP 78mmHg.  Marland Kitchen PAD (peripheral artery disease) ([place])   . Pneumonia    30 yrs. ago  . Renal artery stenosis ([facility])    s/p left renal artery stent 02/18/21     Allergies as of 02/26/2021      Reactions   Lisinopril Cough   Losartan Other (See Comments)   Patient reports nightmares/vivid dreams when taking.      Medication List    TAKE these medications   aspirin EC 81 MG tablet Take 1 tablet (81 mg total) by mouth daily.   cetirizine 10 MG tablet Commonly known as: ZYRTEC Take 10 mg by mouth daily.   clorazepate 7.5 MG tablet Commonly known as: Tranxene-T Take 1 tablet (7.5 mg total) by mouth 2 (two) times daily as needed for anxiety.   digoxin 0.125 MG tablet Commonly known as: LANOXIN Take 0.5  tablets (0.0625 mg total) by mouth every other day.   Dulaglutide 3 MG/0.5ML Sopn Inject 3 mg into the skin once a week.   furosemide 20 MG tablet Commonly known as: LASIX TAKE 2 TABLETS BY MOUTH IN THE MORNING AND AN EXTRA TABLET IF GAIN 3 OR MORE POUNDS IN 1 DAY What changed: See the new instructions.   glipiZIDE 10 MG 24 hr tablet Commonly known as: GLUCOTROL XL Take 10 mg by mouth 2 (two) times daily.   levothyroxine 125 MCG tablet Commonly known as: SYNTHROID Take 125 mcg by mouth daily before breakfast.   metoprolol 200 MG 24 hr tablet Commonly known as: TOPROL-XL TAKE 1 TABLET BY MOUTH EVERY DAY   nilotinib 150 MG capsule Commonly known as: Tasigna Take 2 capsules (300 mg total) by mouth every 12 (twelve) hours. Take on an empty stomach, 1 hour before or 2 hours after meals.   oxyCODONE 5 MG immediate release tablet Commonly known as: Roxicodone Take 1 tablet (5 mg total) by mouth every 8 (eight) hours as needed. What changed:   how much to take  when to take this  reasons to take this   oxyCODONE-acetaminophen 5-325 MG tablet Commonly known as: Percocet Take 1 tablet by mouth every 6 (six) hours as needed.   rosuvastatin 5 MG tablet Commonly known as: Crestor Take 2 tablets (10 mg total) by mouth daily. What changed: how much to take   sitaGLIPtin 50 MG tablet Commonly known as: JANUVIA Take 50 mg by mouth daily.   spironolactone 25 MG tablet Commonly known as: ALDACTONE Take 0.5 tablets (12.5 mg total) by mouth 2 (two) times daily. What changed:   how much to take  when to take this       Discharge Instructions: [facility] Discharge instructions Lower Extremity Bypass Surgery  Please refer to the following instruction for your post-procedure care. Your surgeon or physician assistant will discuss any changes with you.  Activity  You are encouraged to walk as much as you can. You can slowly return to normal  activities during the month after your surgery. Avoid  strenuous activity and heavy lifting until your doctor tells you it's OK. Avoid activities such as vacuuming or swinging a golf club. Do not drive until your doctor give the OK and you are no longer taking prescription pain medications. It is also normal to have difficulty with sleep habits, eating and bowel movement after surgery. These will go away with time.  Bathing/Showering  You may shower after you go home. Do not soak in a bathtub, hot tub, or swim until the incision heals completely.  Incision Care  Clean your incision with mild soap and water. Shower every day. Pat the area dry with a clean towel. You do not need a bandage unless otherwise instructed. Do not apply any ointments or creams to your incision. If you have open wounds you will be instructed how to care for them or a visiting nurse may be arranged for you. If you have staples or sutures along your incision they will be removed at your post-op appointment. You may have skin glue on your incision. Do not peel it off. It will come off on its own in about one week.  Wash the groin wound with soap and water daily and pat dry. (No tub bath-only shower)  Then put a dry gauze or washcloth in the groin to keep this area dry to help prevent wound infection.  Do this daily and as needed.  Do not use Vaseline or neosporin on your incisions.  Only use soap and water on your incisions and then protect and keep dry.  Diet  Resume your normal diet. There are no special food restrictions following this procedure. A low fat/ low cholesterol diet is recommended for all patients with vascular disease. In order to heal from your surgery, it is CRITICAL to get adequate nutrition. Your body requires vitamins, minerals, and protein. Vegetables are the best source of vitamins and minerals. Vegetables also provide the perfect balance of protein. Processed food has little nutritional value, so try to avoid  this.  Medications  Resume taking all your medications unless your doctor or Physician Assistant tells you not to. If your incision is causing pain, you may take over-the-counter pain relievers such as acetaminophen (Tylenol). If you were prescribed a stronger pain medication, please aware these medication can cause nausea and constipation. Prevent nausea by taking the medication with a snack or meal. Avoid constipation by drinking plenty of fluids and eating foods with high amount of fiber, such as fruits, vegetables, and grains. Take Colace 100 mg (an over-the-counter stool softener) twice a day as needed for constipation.  Do not take Tylenol if you are taking prescription pain medications.  Follow Up  Our office will schedule a follow up appointment 2-3 weeks following discharge.  Please call us immediately for any of the following conditions  .Severe or worsening pain in your legs or feet while at rest or while walking .Increase pain, redness, warmth, or drainage (pus) from your incision site(s) . Fever of 101 degree or higher . The swelling in your leg with the bypass suddenly worsens and becomes more painful than when you were in the hospital . If you have been instructed to feel your graft pulse then you should do so every day. If you can no longer feel this pulse, call the office immediately. Not all patients are given this instruction. .  Leg swelling is common after leg bypass surgery.  The swelling should improve over a few months following surgery. To improve the swelling, you may  elevate your legs above the level of your heart while you are sitting or resting. Your surgeon or physician assistant may ask you to apply an ACE wrap or wear compression (TED) stockings to help to reduce swelling.  Reduce your risk of vascular disease  Stop smoking. If you would like help call [facility] at 1-800-QUIT-NOW 9171511489) or [place] at [phone].  . Manage your  cholesterol . Maintain a desired weight . Control your diabetes weight . Control your diabetes . Keep your blood pressure down .  If you have any questions, please call the office at [phone]   Disposition: home  Patient's condition: is Good  Follow up: 1.  [facility] in 2-3 weeks   Leontine Locket, PA-C [facility] [phone] 03/06/2021  11:01 AM  - For VQI Registry use ---   Post-op:  Wound infection: No  Graft infection: No  Transfusion: No    If yes, n/a units given New Arrhythmia: No Ipsilateral amputation: No, [ ]  Minor, [ ]  BKA, [ ]  AKA Discharge patency: [x ] Primary, [ ]  Primary assisted, [ ]  Secondary, [ ]  Occluded Patency judged by: [x ] Dopper only, [ ]  Palpable graft pulse, []  Palpable distal pulse, [ ]  ABI inc. > 0.15, [ ]  Duplex Discharge ABI: R not done, L not done D/C Ambulatory Status: Ambulatory  Complications: MI: No, [ ]  Troponin only, [ ]  EKG or Clinical CHF: No Resp failure:No, [ ]  Pneumonia, [ ]  Ventilator Chg in renal function: No, [ ]  Inc. Cr > 0.5, [ ]  Temp. Dialysis,  [ ]  Permanent dialysis Stroke: No, [ ]  Minor, [ ]  Major Return to OR: No  Reason for return to OR: [ ]  Bleeding, [ ]  Infection, [ ]  Thrombosis, [ ]  Revision  Discharge medications: Statin use:  yes ASA use:  yes Plavix use:  no Beta blocker use: yes CCB use:  No ACEI use:   no ARB use:  no Coumadin use: no

## 2021-03-06 NOTE — Telephone Encounter (Addendum)
Patient's daughter called - patient has been lethargic and low energy and poor appetite for about a week. She is s/p redo L fem bypass - daughter says leg is warm and incisions are fine. She says patient is acting the way she acts when she needed blood before. Told her it did not sound acutely vascular in nature, and she agrees. She is going to take her to the [facility] for evaluation. Advised her to let us know if we needed to move patient appt.   Patient was found to have Hgb of 7.1 upon arrival to the ED with a positive hemoccult test- she was transfused with one unit and advised to set up appt for endoscopy. Spoke with [place] - would be ideal if patient continued to take aspirin from a vascular standpoint - patient unsure if she can, but aware of recommendation and will follow up with our office next week.

## 2021-03-10 ENCOUNTER — Other Ambulatory Visit: Payer: Self-pay

## 2021-03-10 ENCOUNTER — Emergency Department (HOSPITAL_COMMUNITY): Payer: PPO

## 2021-03-10 ENCOUNTER — Inpatient Hospital Stay (HOSPITAL_COMMUNITY)
Admission: EM | Admit: 2021-03-10 | Discharge: 2021-03-14 | DRG: 378 | Disposition: A | Discharge status: Home Health | Payer: PPO | Attending: Internal Medicine | Admitting: Internal Medicine

## 2021-03-10 DIAGNOSIS — E1151 Type 2 diabetes mellitus with diabetic peripheral angiopathy without gangrene: Secondary | ICD-10-CM | POA: Diagnosis present

## 2021-03-10 DIAGNOSIS — K922 Gastrointestinal hemorrhage, unspecified: Secondary | ICD-10-CM

## 2021-03-10 DIAGNOSIS — R109 Unspecified abdominal pain: Secondary | ICD-10-CM | POA: Diagnosis not present

## 2021-03-10 DIAGNOSIS — Z20822 Contact with and (suspected) exposure to covid-19: Secondary | ICD-10-CM | POA: Diagnosis present

## 2021-03-10 DIAGNOSIS — K449 Diaphragmatic hernia without obstruction or gangrene: Secondary | ICD-10-CM | POA: Diagnosis not present

## 2021-03-10 DIAGNOSIS — I739 Peripheral vascular disease, unspecified: Secondary | ICD-10-CM | POA: Diagnosis not present

## 2021-03-10 DIAGNOSIS — Z833 Family history of diabetes mellitus: Secondary | ICD-10-CM

## 2021-03-10 DIAGNOSIS — Z90710 Acquired absence of both cervix and uterus: Secondary | ICD-10-CM | POA: Diagnosis not present

## 2021-03-10 DIAGNOSIS — Z87891 Personal history of nicotine dependence: Secondary | ICD-10-CM | POA: Diagnosis not present

## 2021-03-10 DIAGNOSIS — K219 Gastro-esophageal reflux disease without esophagitis: Secondary | ICD-10-CM | POA: Diagnosis not present

## 2021-03-10 DIAGNOSIS — N1831 Chronic kidney disease, stage 3a: Secondary | ICD-10-CM | POA: Diagnosis present

## 2021-03-10 DIAGNOSIS — N1832 Chronic kidney disease, stage 3b: Secondary | ICD-10-CM

## 2021-03-10 DIAGNOSIS — K5900 Constipation, unspecified: Secondary | ICD-10-CM | POA: Diagnosis present

## 2021-03-10 DIAGNOSIS — K222 Esophageal obstruction: Secondary | ICD-10-CM | POA: Diagnosis present

## 2021-03-10 DIAGNOSIS — I428 Other cardiomyopathies: Secondary | ICD-10-CM | POA: Diagnosis not present

## 2021-03-10 DIAGNOSIS — K921 Melena: Secondary | ICD-10-CM | POA: Diagnosis not present

## 2021-03-10 DIAGNOSIS — Z7984 Long term (current) use of oral hypoglycemic drugs: Secondary | ICD-10-CM

## 2021-03-10 DIAGNOSIS — I5022 Chronic systolic (congestive) heart failure: Secondary | ICD-10-CM | POA: Diagnosis present

## 2021-03-10 DIAGNOSIS — E119 Type 2 diabetes mellitus without complications: Secondary | ICD-10-CM

## 2021-03-10 DIAGNOSIS — R531 Weakness: Secondary | ICD-10-CM | POA: Diagnosis not present

## 2021-03-10 DIAGNOSIS — I482 Chronic atrial fibrillation, unspecified: Secondary | ICD-10-CM | POA: Diagnosis present

## 2021-03-10 DIAGNOSIS — C9210 Chronic myeloid leukemia, BCR/ABL-positive, not having achieved remission: Secondary | ICD-10-CM | POA: Diagnosis present

## 2021-03-10 DIAGNOSIS — I4891 Unspecified atrial fibrillation: Secondary | ICD-10-CM

## 2021-03-10 DIAGNOSIS — E039 Hypothyroidism, unspecified: Secondary | ICD-10-CM | POA: Diagnosis present

## 2021-03-10 DIAGNOSIS — K31819 Angiodysplasia of stomach and duodenum without bleeding: Secondary | ICD-10-CM

## 2021-03-10 DIAGNOSIS — I130 Hypertensive heart and chronic kidney disease with heart failure and stage 1 through stage 4 chronic kidney disease, or unspecified chronic kidney disease: Secondary | ICD-10-CM | POA: Diagnosis not present

## 2021-03-10 DIAGNOSIS — Z7982 Long term (current) use of aspirin: Secondary | ICD-10-CM | POA: Diagnosis not present

## 2021-03-10 DIAGNOSIS — F419 Anxiety disorder, unspecified: Secondary | ICD-10-CM | POA: Diagnosis not present

## 2021-03-10 DIAGNOSIS — Z79890 Hormone replacement therapy (postmenopausal): Secondary | ICD-10-CM | POA: Diagnosis not present

## 2021-03-10 DIAGNOSIS — Z79899 Other long term (current) drug therapy: Secondary | ICD-10-CM

## 2021-03-10 DIAGNOSIS — N183 Chronic kidney disease, stage 3 unspecified: Secondary | ICD-10-CM | POA: Diagnosis present

## 2021-03-10 DIAGNOSIS — K31811 Angiodysplasia of stomach and duodenum with bleeding: Principal | ICD-10-CM | POA: Diagnosis present

## 2021-03-10 DIAGNOSIS — Z888 Allergy status to other drugs, medicaments and biological substances status: Secondary | ICD-10-CM | POA: Diagnosis not present

## 2021-03-10 DIAGNOSIS — D62 Acute posthemorrhagic anemia: Secondary | ICD-10-CM | POA: Diagnosis present

## 2021-03-10 DIAGNOSIS — I959 Hypotension, unspecified: Secondary | ICD-10-CM | POA: Diagnosis not present

## 2021-03-10 LAB — COMPREHENSIVE METABOLIC PANEL
ALT: 11 U/L (ref 0–44)
AST: 9 U/L — ABNORMAL LOW (ref 15–41)
Albumin: 2.8 g/dL — ABNORMAL LOW (ref 3.5–5.0)
Alkaline Phosphatase: 42 U/L (ref 38–126)
Anion gap: 6 (ref 5–15)
BUN: 37 mg/dL — ABNORMAL HIGH (ref 8–23)
CO2: 20 mmol/L — ABNORMAL LOW (ref 22–32)
Calcium: 8.3 mg/dL — ABNORMAL LOW (ref 8.9–10.3)
Chloride: 112 mmol/L — ABNORMAL HIGH (ref 98–111)
Creatinine, Ser: 1.14 mg/dL — ABNORMAL HIGH (ref 0.44–1.00)
GFR, Estimated: 49 mL/min — ABNORMAL LOW (ref >60)
Glucose, Bld: 170 mg/dL — ABNORMAL HIGH (ref 70–99)
Potassium: 4.1 mmol/L (ref 3.5–5.1)
Sodium: 138 mmol/L (ref 135–145)
Total Bilirubin: 0.8 mg/dL (ref 0.3–1.2)
Total Protein: 5.3 g/dL — ABNORMAL LOW (ref 6.5–8.1)

## 2021-03-10 LAB — CBC WITH DIFFERENTIAL/PLATELET
Abs Immature Granulocytes: 0 10*3/uL (ref 0.00–0.07)
Basophils Absolute: 0 10*3/uL (ref 0.0–0.1)
Basophils Relative: 0 %
Eosinophils Absolute: 0 10*3/uL (ref 0.0–0.5)
Eosinophils Relative: 0 %
HCT: 19.1 % — ABNORMAL LOW (ref 36.0–46.0)
Hemoglobin: 5.7 g/dL — CL (ref 12.0–15.0)
Lymphocytes Relative: 9 %
Lymphs Abs: 1.8 10*3/uL (ref 0.7–4.0)
MCH: 28.6 pg (ref 26.0–34.0)
MCHC: 29.8 g/dL — ABNORMAL LOW (ref 30.0–36.0)
MCV: 96 fL (ref 80.0–100.0)
Monocytes Absolute: 0.2 10*3/uL (ref 0.1–1.0)
Monocytes Relative: 1 %
Neutro Abs: 18.3 10*3/uL — ABNORMAL HIGH (ref 1.7–7.7)
Neutrophils Relative %: 90 %
Platelets: 523 10*3/uL — ABNORMAL HIGH (ref 150–400)
RBC: 1.99 MIL/uL — ABNORMAL LOW (ref 3.87–5.11)
RDW: 18.3 % — ABNORMAL HIGH (ref 11.5–15.5)
WBC: 20.3 10*3/uL — ABNORMAL HIGH (ref 4.0–10.5)
nRBC: 0 /100 WBC
nRBC: 1.1 % — ABNORMAL HIGH (ref 0.0–0.2)

## 2021-03-10 LAB — RESP PANEL BY RT-PCR (FLU A&B, COVID) ARPGX2
Influenza A by PCR: NEGATIVE
Influenza B by PCR: NEGATIVE
SARS Coronavirus 2 by RT PCR: NEGATIVE

## 2021-03-10 LAB — HEMOGLOBIN AND HEMATOCRIT, BLOOD
HCT: 25.9 % — ABNORMAL LOW (ref 36.0–46.0)
Hemoglobin: 8.2 g/dL — ABNORMAL LOW (ref 12.0–15.0)

## 2021-03-10 LAB — GLUCOSE, CAPILLARY
Glucose-Capillary: 142 mg/dL — ABNORMAL HIGH (ref 70–99)
Glucose-Capillary: 182 mg/dL — ABNORMAL HIGH (ref 70–99)

## 2021-03-10 LAB — PROTIME-INR
INR: 1.3 — ABNORMAL HIGH (ref 0.8–1.2)
Prothrombin Time: 15.4 seconds — ABNORMAL HIGH (ref 11.4–15.2)

## 2021-03-10 LAB — DIGOXIN LEVEL: Digoxin Level: 0.4 ng/mL — ABNORMAL LOW (ref 0.8–2.0)

## 2021-03-10 LAB — PREPARE RBC (CROSSMATCH)

## 2021-03-10 MED ORDER — OXYCODONE HCL 5 MG PO TABS: 2.5000 mg | ORAL_TABLET | Freq: Four times a day (QID) | ORAL | Status: DC | PRN

## 2021-03-10 MED ORDER — ROSUVASTATIN CALCIUM 5 MG PO TABS
10.0000 mg | ORAL_TABLET | Freq: Every day | ORAL | Status: DC
  Administered 2021-03-10 – 2021-03-14 (×5): 10 mg via ORAL
  Filled 2021-03-10 (×5): qty 2

## 2021-03-10 MED ORDER — NILOTINIB HCL 150 MG PO CAPS: 300.0000 mg | ORAL_CAPSULE | Freq: Two times a day (BID) | ORAL | Status: DC

## 2021-03-10 MED ORDER — ALBUTEROL SULFATE (2.5 MG/3ML) 0.083% IN NEBU: 2.5000 mg | INHALATION_SOLUTION | Freq: Four times a day (QID) | RESPIRATORY_TRACT | Status: DC | PRN

## 2021-03-10 MED ORDER — DIGOXIN 125 MCG PO TABS
0.0625 mg | ORAL_TABLET | ORAL | Status: DC
  Administered 2021-03-11 – 2021-03-13 (×2): 0.0625 mg via ORAL
  Filled 2021-03-10 (×2): qty 1

## 2021-03-10 MED ORDER — CLORAZEPATE DIPOTASSIUM 3.75 MG PO TABS: 7.5000 mg | ORAL_TABLET | Freq: Two times a day (BID) | ORAL | Status: DC | PRN

## 2021-03-10 MED ORDER — SODIUM CHLORIDE 0.9 % IV SOLN
10.0000 mL/h | Freq: Once | INTRAVENOUS | Status: AC
  Administered 2021-03-10: 10 mL/h via INTRAVENOUS

## 2021-03-10 MED ORDER — LORATADINE 10 MG PO TABS
10.0000 mg | ORAL_TABLET | Freq: Every day | ORAL | Status: DC
  Administered 2021-03-10 – 2021-03-14 (×4): 10 mg via ORAL
  Filled 2021-03-10 (×4): qty 1

## 2021-03-10 MED ORDER — SODIUM CHLORIDE 0.9 % IV SOLN
8.0000 mg/h | INTRAVENOUS | Status: DC
  Administered 2021-03-10 – 2021-03-11 (×2): 8 mg/h via INTRAVENOUS
  Filled 2021-03-10 (×4): qty 80

## 2021-03-10 MED ORDER — SODIUM CHLORIDE 0.9 % IV BOLUS
500.0000 mL | Freq: Once | INTRAVENOUS | Status: AC
Start: 2021-03-10 — End: 2021-03-10
  Administered 2021-03-10: 500 mL via INTRAVENOUS

## 2021-03-10 MED ORDER — SODIUM CHLORIDE 0.9% FLUSH
3.0000 mL | Freq: Two times a day (BID) | INTRAVENOUS | Status: DC
  Administered 2021-03-10 – 2021-03-14 (×6): 3 mL via INTRAVENOUS

## 2021-03-10 MED ORDER — LEVOTHYROXINE SODIUM 25 MCG PO TABS
125.0000 ug | ORAL_TABLET | Freq: Every day | ORAL | Status: DC
  Administered 2021-03-12 – 2021-03-14 (×2): 125 ug via ORAL
  Filled 2021-03-10 (×2): qty 1

## 2021-03-10 MED ORDER — INSULIN ASPART 100 UNIT/ML ~~LOC~~ SOLN
0.0000 [IU] | Freq: Three times a day (TID) | SUBCUTANEOUS | Status: DC
  Administered 2021-03-10 – 2021-03-11 (×3): 1 [IU] via SUBCUTANEOUS
  Administered 2021-03-11: 2 [IU] via SUBCUTANEOUS
  Administered 2021-03-12 – 2021-03-13 (×4): 1 [IU] via SUBCUTANEOUS

## 2021-03-10 MED ORDER — SODIUM CHLORIDE 0.9 % IV SOLN
80.0000 mg | Freq: Once | INTRAVENOUS | Status: AC
  Administered 2021-03-10: 80 mg via INTRAVENOUS
  Filled 2021-03-10: qty 80

## 2021-03-10 NOTE — ED Notes (Signed)
Family updated as to patient's status.

## 2021-03-10 NOTE — H&P (Signed)
History and Physical    Angeliah Wisdom HWT:[id number] DOB: 13-Jan-1941 DOA: 03/10/2021  Referring MD/NP/PA: Davonna Belling, MD PCP: Cyndi Bender, PA-C  Patient coming from: home  Chief Complaint: Weakness  I have personally briefly reviewed patient's old medical records in [place]   HPI: Dynver Clemson is a 80 y.o. female with medical history significant of CML, systolic CHF, chronic atrial fibrillation, DM type II, PAD, renal artery stenosis s/p stent 09/2020, and hypothyroidism presents with complaints of weakness and constipation.  She just underwent a redo of the left common femoral endarterectomy with profundoplasty and bovine pericardial patch angioplasty with left common femoral to below-knee popliteal artery bypass on 3/28 with Dr. Carlis Abbott.  Following the procedure patient reports that she has been on low-dose aspirin and atorvastatin.  She has been taking the medications as prescribed.  However, since the procedure patient has been progressively getting weaker.  She had reported having a dark tarry stool on 5 days ago, but has not had a significant bowel movement thereafter. Seen at [facility] ED 4 days ago for the symptoms of weakness.  At that time it appears her hemoglobin was 7.3 and stool guaiacs were noted to be positive.  Patient was transfused 1 unit of packed red blood cells and ultimately discharged home.   Denies any significant shortness of breath, chest pain, nausea, vomiting, or abdominal pain.  Since being seen at [facility] he has not taken any additional aspirin.  Patient reports prior history of GI bleed and reason that she is not on anticoagulation.  Last EGD 03/2020 Dr. Lyndel Safe revealed small hiatal hernia, mild gastroduodenitis, 2 sessile polyps removed, mild sigmoid diverticulosis, and nonbleeding external and internal hemorrhoids.  ED Course: Upon admission into the emergency department patient was seen to be afebrile, pulse elevated into the 120s in atrial  fibrillation, respirations 18-28, blood pressure 105/49-120 7/54, and O2 saturations currently maintained on room air.  Labs significant for WBC 20.3, hemoglobin 5.7, platelets 523, BUN 37, creatinine 1.14, glucose 170, and INR 1.3.  Acute abdominal series x-ray revealed diffuse stool throughout the colon without signs of acute obstruction.  Patient had been ordered to be transfused 2 units of packed red blood cells.  Patient was given 500 mL of normal saline IV fluids and started on Protonix drip.   GI was formally consulted.  Review of Systems  Constitutional: Positive for malaise/fatigue. Negative for fever.  HENT: Negative for congestion and ear discharge.   Eyes: Negative for double vision and pain.  Respiratory: Negative for cough and shortness of breath.   Cardiovascular: Negative for chest pain and leg swelling.  Gastrointestinal: Positive for constipation and melena. Negative for abdominal pain, nausea and vomiting.  Genitourinary: Negative for dysuria and hematuria.  Musculoskeletal: Negative for falls and joint pain.  Neurological: Positive for weakness. Negative for speech change.  Psychiatric/Behavioral: Negative for memory loss and substance abuse.    Past Medical History:  Diagnosis Date  . Atrial fibrillation ([place])   . Chronic atrial fibrillation ([place])    a. Refuses [place].  CHA2DS2VASc = 4.  . Chronic systolic CHF (congestive heart failure) ([place])    a. 05/2015 Echo: EF 35-40%.  . CKD (chronic kidney disease), stage III ([place])   . CML (chronic myelocytic leukemia) ([place]) 01/10/2013  . DM (diabetes mellitus) ([place])   . Dysrhythmia   . Elevated WBC count   . Epistaxis 04/03/2020   AFTER COVID SWAB  . Hypertension   . Hypertensive heart disease   . Hypothyroidism   .  NICM (nonischemic cardiomyopathy) ([place])    a. 06/2013 MV: low risk study w/o ischemia;  b. 05/2015 Echo: EF 35-40%, diff HK, basal-midinferoseptal and basal-midanteroseptal AK. Mild-mod MR, mod dil LA, mild to  mod TR, PASP 12mmHg.  Marland Kitchen PAD (peripheral artery disease) ([place])   . Pneumonia    30 yrs. ago  . Renal artery stenosis ([facility])    s/p left renal artery stent 02/18/21    Past Surgical History:  Procedure Laterality Date  . ABDOMINAL AORTOGRAM W/LOWER EXTREMITY Bilateral 12/06/2019   Procedure: ABDOMINAL AORTOGRAM W/LOWER EXTREMITY;  Surgeon: [name], MD;  Location: [place] [facility];  Service: Cardiovascular;  Laterality: Bilateral;  . ABDOMINAL AORTOGRAM W/LOWER EXTREMITY Bilateral 05/15/2020   Procedure: ABDOMINAL AORTOGRAM W/LOWER EXTREMITY;  Surgeon: [name], MD;  Location: [place] [facility];  Service: Cardiovascular;  Laterality: Bilateral;  . ABDOMINAL AORTOGRAM W/LOWER EXTREMITY N/A 02/18/2021   Procedure: ABDOMINAL AORTOGRAM W/LOWER EXTREMITY;  Surgeon: Serafina Mitchell, MD;  Location: [place] [facility];  Service: Cardiovascular;  Laterality: N/A;  . ABDOMINAL HYSTERECTOMY    . APPLICATION OF WOUND VAC  01/10/2020   BLEEDING FROM FEMORAL BYPASS  . BIOPSY  04/08/2020   Procedure: BIOPSY;  Surgeon: Jackquline Denmark, MD;  Location: [facility] ENDOSCOPY;  Service: Endoscopy;;  . CARDIOVERSION    . COLONOSCOPY WITH PROPOFOL N/A 04/08/2020   Procedure: COLONOSCOPY WITH PROPOFOL;  Surgeon: Jackquline Denmark, MD;  Location: [facility] ENDOSCOPY;  Service: Endoscopy;  Laterality: N/A;  . CYST EXCISION     removed on right ovary  . CYSTECTOMY    . ENDARTERECTOMY FEMORAL Left 06/20/2020   Procedure: LEFT COMMON FEMORAL ENDARTERECTOMY LEFT ABOVE KNEE POPLITEAL ENDARTERECTOMY;  Surgeon: [name], MD;  Location: [place];  Service: Vascular;  Laterality: Left;  . ENDARTERECTOMY FEMORAL Left 02/24/2021   Procedure: REDO LEFT COMMON FEMORAL ENDARTERECTOMY AND PROFUNDAPLASTY WTH BOVINE PATCH;  Surgeon: [name], MD;  Location: [place];  Service: Vascular;  Laterality: Left;  . ESOPHAGOGASTRODUODENOSCOPY (EGD) WITH PROPOFOL N/A 04/08/2020   Procedure: ESOPHAGOGASTRODUODENOSCOPY (EGD)  WITH PROPOFOL;  Surgeon: Jackquline Denmark, MD;  Location: [facility] ENDOSCOPY;  Service: Endoscopy;  Laterality: N/A;  . FEMORAL ARTERY - POPLITEAL ARTERY BYPASS GRAFT  12/15/2019   Right common femoral artery to below-knee popliteal artery bypass with ipsilateral nonreversed great saphenous vein  . FEMORAL-POPLITEAL BYPASS GRAFT Right 12/15/2019   Procedure: BYPASS GRAFT FEMORAL-POPLITEAL ARTERY;  Surgeon: [name], MD;  Location: [place];  Service: Vascular;  Laterality: Right;  . FEMORAL-POPLITEAL BYPASS GRAFT Left 06/20/2020   Procedure: BYPASS GRAFT FEMORAL TO ABOVE KNEE POPLITEAL ARTERY WITH HARVENSTED NON REVERSED GREATER SAPHENOUS VEIN;  Surgeon: [name], MD;  Location: [place];  Service: Vascular;  Laterality: Left;  . FEMORAL-POPLITEAL BYPASS GRAFT Left 02/24/2021   Procedure: REDO LEFT COMMON FEMORAL ARTERY EXPOSURE AND LEFT COMMON FEMORAL ARTERY TO BELOW KNEE POPLITEAL ARTERY BYPASS GRAFT WITH PROPATEN VASCULAR GRAFT;  Surgeon: [name], MD;  Location: [place];  Service: Vascular;  Laterality: Left;  . HYSTEROTOMY    . PERIPHERAL VASCULAR INTERVENTION Right 12/06/2019   Procedure: PERIPHERAL VASCULAR INTERVENTION;  Surgeon: [name], MD;  Location: [place] [facility];  Service: Cardiovascular;  Laterality: Right;  EXT ILIAC  . PERIPHERAL VASCULAR INTERVENTION Left 02/18/2021   Procedure: PERIPHERAL VASCULAR INTERVENTION;  Surgeon: Serafina Mitchell, MD;  Location: [place] [facility];  Service: Cardiovascular;  Laterality: Left;  renal artery  . POLYPECTOMY  04/08/2020   Procedure: POLYPECTOMY;  Surgeon: Jackquline Denmark, MD;  Location: [facility] ENDOSCOPY;  Service: Endoscopy;;  . WOUND EXPLORATION Right 01/10/2020   Procedure: right lower leg Wound Exploration with wound vac application;  Surgeon: Serafina Mitchell, MD;  Location: [place];  Service: Vascular;  Laterality: Right;     reports that she quit smoking about 37 years ago. Her smoking use included cigarettes. She  has never used smokeless tobacco. She reports that she does not drink alcohol and does not use drugs.  Allergies  Allergen Reactions  . Lisinopril Cough  . Losartan Other (See Comments)    Patient reports nightmares/vivid dreams when taking.    Family History  Problem Relation Age of Onset  . Cancer - Colon Mother        mast to lungs  . Diabetes Father   . Diabetes Paternal Grandmother     Prior to Admission medications   Medication Sig Start Date End Date Taking? Authorizing Provider  aspirin EC 81 MG tablet Take 1 tablet (81 mg total) by mouth daily. Patient not taking: No sig reported 12/17/19   Gabriel Earing, PA-C  cetirizine (ZYRTEC) 10 MG tablet Take 10 mg by mouth daily.    [provider]  clorazepate (TRANXENE-T) 7.5 MG tablet Take 1 tablet (7.5 mg total) by mouth 2 (two) times daily as needed for anxiety. 01/10/13   Maryanna Shape, NP  digoxin (LANOXIN) 0.125 MG tablet Take 0.5 tablets (0.0625 mg total) by mouth every other day. 10/30/19   Troy Sine, MD  Dulaglutide 3 MG/0.5ML SOPN Inject 3 mg into the skin once a week.    [provider]  furosemide (LASIX) 20 MG tablet TAKE 2 TABLETS BY MOUTH IN THE MORNING AND AN EXTRA TABLET IF GAIN 3 OR MORE POUNDS IN 1 DAY Patient taking differently: Take 20-40 mg by mouth See admin instructions. Take 40 mg in the morning, may take an additional 20 mg for weight gain of 3lbs or more in one day 10/02/20   Troy Sine, MD  glipiZIDE (GLUCOTROL XL) 10 MG 24 hr tablet Take 10 mg by mouth 2 (two) times daily.  07/07/14   [provider]  levothyroxine (SYNTHROID) 125 MCG tablet Take 125 mcg by mouth daily before breakfast. 11/15/19   [provider]  metoprolol (TOPROL-XL) 200 MG 24 hr tablet TAKE 1 TABLET BY MOUTH EVERY DAY Patient taking differently: Take 200 mg by mouth daily. 09/23/20   Troy Sine, MD  nilotinib (TASIGNA) 150 MG capsule Take 2 capsules (300 mg total) by mouth every 12  (twelve) hours. Take on an empty stomach, 1 hour before or 2 hours after meals. 12/24/20   Wyatt Portela, MD  oxyCODONE (ROXICODONE) 5 MG immediate release tablet Take 1 tablet (5 mg total) by mouth every 8 (eight) hours as needed. Patient taking differently: Take 2.5-5 mg by mouth every 6 (six) hours as needed for moderate pain. 06/22/20 06/22/21  Setzer, Edman Circle, PA-C  oxyCODONE-acetaminophen (PERCOCET) 5-325 MG tablet Take 1 tablet by mouth every 6 (six) hours as needed. 02/26/21   Rhyne, Hulen Shouts, PA-C  rosuvastatin (CRESTOR) 5 MG tablet Take 2 tablets (10 mg total) by mouth daily. 02/26/21 02/26/22  Rhyne, Hulen Shouts, PA-C  sitaGLIPtin (JANUVIA) 50 MG tablet Take 50 mg by mouth daily.  07/13/16   [provider]  spironolactone (ALDACTONE) 25 MG tablet Take 0.5 tablets (12.5 mg total) by mouth 2 (two) times daily. Patient taking differently: Take 25 mg by  mouth every evening. 01/24/18 11/27/20  Troy Sine, MD    Physical Exam:  Constitutional: NAD, calm, comfortable Vitals:   03/10/21 1100 03/10/21 1147 03/10/21 1223 03/10/21 1223  BP: (!) 105/49 (!) 127/54 (!) 114/55 (!) 114/55  Pulse: 96 (!) 111 (!) 120 (!) 120  Resp: 19 18 18 18   Temp:  97.7 F (36.5 C) 97.6 F (36.4 C) 97.6 F (36.4 C)  TempSrc:  Oral Oral Oral  SpO2: 94% 94%  94%  Weight:      Height:       Eyes: PERRL, lids and conjunctivae normal ENMT: Mucous membranes are moist. Posterior pharynx clear of any exudate or lesions.Normal dentition.  Neck: normal, supple, no masses, no thyromegaly Respiratory: clear to auscultation bilaterally, no wheezing, no crackles. Normal respiratory effort. No accessory muscle use.  Cardiovascular: Regular rate and rhythm, no murmurs / rubs / gallops. No extremity edema. 2+ pedal pulses. No carotid bruits.  Abdomen: no tenderness, no masses palpated. No hepatosplenomegaly. Bowel sounds positive.  Musculoskeletal: no clubbing / cyanosis. No joint deformity upper and lower  extremities. Good ROM, no contractures. Normal muscle tone.  Skin: no rashes, lesions, ulcers. No induration Neurologic: CN 2-12 grossly intact. Sensation intact, DTR normal. Strength 5/5 in all 4.  Psychiatric: Normal judgment and insight. Alert and oriented x 3. Normal mood.     Labs on Admission: I have personally reviewed following labs and imaging studies  CBC: Recent Labs  Lab 03/10/21 1028  WBC 20.3*  NEUTROABS 18.3*  HGB 5.7*  HCT 19.1*  MCV 96.0  PLT 834*   Basic Metabolic Panel: Recent Labs  Lab 03/10/21 1028  NA 138  K 4.1  CL 112*  CO2 20*  GLUCOSE 170*  BUN 37*  CREATININE 1.14*  CALCIUM 8.3*   GFR: Estimated Creatinine Clearance: 44.9 mL/min (A) (by C-G formula based on SCr of 1.14 mg/dL (H)). Liver Function Tests: Recent Labs  Lab 03/10/21 1028  AST 9*  ALT 11  ALKPHOS 42  BILITOT 0.8  PROT 5.3*  ALBUMIN 2.8*   No results for input(s): LIPASE, AMYLASE in the last 168 hours. No results for input(s): AMMONIA in the last 168 hours. Coagulation Profile: Recent Labs  Lab 03/10/21 1028  INR 1.3*   Cardiac Enzymes: No results for input(s): CKTOTAL, CKMB, CKMBINDEX, TROPONINI in the last 168 hours. BNP (last 3 results) No results for input(s): PROBNP in the last 8760 hours. HbA1C: No results for input(s): HGBA1C in the last 72 hours. CBG: No results for input(s): GLUCAP in the last 168 hours. Lipid Profile: No results for input(s): CHOL, HDL, LDLCALC, TRIG, CHOLHDL, LDLDIRECT in the last 72 hours. Thyroid Function Tests: No results for input(s): TSH, T4TOTAL, FREET4, T3FREE, THYROIDAB in the last 72 hours. Anemia Panel: No results for input(s): VITAMINB12, FOLATE, FERRITIN, TIBC, IRON, RETICCTPCT in the last 72 hours. Urine analysis:    Component Value Date/Time   COLORURINE YELLOW 02/24/2021 0833   APPEARANCEUR CLEAR 02/24/2021 0833   LABSPEC 1.010 02/24/2021 0833   PHURINE 5.0 02/24/2021 0833   GLUCOSEU NEGATIVE 02/24/2021 0833    HGBUR SMALL (A) 02/24/2021 0833   BILIRUBINUR NEGATIVE 02/24/2021 0833   KETONESUR NEGATIVE 02/24/2021 0833   PROTEINUR NEGATIVE 02/24/2021 0833   NITRITE NEGATIVE 02/24/2021 0833   LEUKOCYTESUR NEGATIVE 02/24/2021 0833   Sepsis Labs: Recent Results (from the past 240 hour(s))  Resp Panel by RT-PCR (Flu A&B, Covid) Nasopharyngeal Swab     Status: None   Collection Time: 03/10/21 10:39  AM   Specimen: Nasopharyngeal Swab; Nasopharyngeal(NP) swabs in vial transport medium  Result Value Ref Range Status   SARS Coronavirus 2 by RT PCR NEGATIVE NEGATIVE Final    Comment: (NOTE) SARS-CoV-2 target nucleic acids are NOT DETECTED.  The SARS-CoV-2 RNA is generally detectable in upper respiratory specimens during the acute phase of infection. The lowest concentration of SARS-CoV-2 viral copies this assay can detect is 138 copies/mL. A negative result does not preclude SARS-Cov-2 infection and should not be used as the sole basis for treatment or other patient management decisions. A negative result may occur with  improper specimen collection/handling, submission of specimen other than nasopharyngeal swab, presence of viral mutation(s) within the areas targeted by this assay, and inadequate number of viral copies(<138 copies/mL). A negative result must be combined with clinical observations, patient history, and epidemiological information. The expected result is Negative.  Fact Sheet for Patients:  EntrepreneurPulse.com.au  Fact Sheet for Healthcare Providers:  IncredibleEmployment.be  This test is no t yet approved or cleared by the [place] FDA and  has been authorized for detection and/or diagnosis of SARS-CoV-2 by FDA under an Emergency Use Authorization (EUA). This EUA will remain  in effect (meaning this test can be used) for the duration of the COVID-19 declaration under Section 564(b)(1) of the Act, 21 U.S.C.section 360bbb-3(b)(1), unless  the authorization is terminated  or revoked sooner.       Influenza A by PCR NEGATIVE NEGATIVE Final   Influenza B by PCR NEGATIVE NEGATIVE Final    Comment: (NOTE) The Xpert Xpress SARS-CoV-2/FLU/RSV plus assay is intended as an aid in the diagnosis of influenza from Nasopharyngeal swab specimens and should not be used as a sole basis for treatment. Nasal washings and aspirates are unacceptable for Xpert Xpress SARS-CoV-2/FLU/RSV testing.  Fact Sheet for Patients: EntrepreneurPulse.com.au  Fact Sheet for Healthcare Providers: IncredibleEmployment.be  This test is not yet approved or cleared by the [place] FDA and has been authorized for detection and/or diagnosis of SARS-CoV-2 by FDA under an Emergency Use Authorization (EUA). This EUA will remain in effect (meaning this test can be used) for the duration of the COVID-19 declaration under Section 564(b)(1) of the Act, 21 U.S.C. section 360bbb-3(b)(1), unless the authorization is terminated or revoked.  Performed at [facility], [place] [address], [place], [place] [address]      Radiological Exams on Admission: DG Abdomen Acute W/Chest  Result Date: 03/10/2021 CLINICAL DATA:  Abdominal pain and fatigue EXAM: DG ABDOMEN ACUTE WITH 1 VIEW CHEST COMPARISON:  CT abdomen and pelvis March 04, 2016 FINDINGS: PA chest: The lungs are clear. There is cardiomegaly with pulmonary vascularity normal. No adenopathy. There is aortic atherosclerosis. Supine and upright abdomen: There is fairly diffuse stool throughout the colon. There is no bowel dilatation or air-fluid level to suggest bowel obstruction. No free air. Right iliac artery region stent noted. Probable phleboliths in the pelvis. Surgical clips overlie the left acetabulum. IMPRESSION: Fairly diffuse stool throughout colon. No bowel obstruction or free air evident. Lungs clear. Cardiomegaly. Aortic Atherosclerosis (ICD10-I70.0).  Electronically Signed   By: Lowella Grip III M.D.   On: 03/10/2021 11:35    EKG: Independently reviewed.  Atrial fibrillation 121 bpm  Assessment/Plan Acute blood loss anemia secondary to upper GI bleed: Patient presents with complaints of weakness and constipation.  Hemoglobin down to 5.7 g/dL.  Patient had previously been 7.3 and received 1 unit of packed red blood cells on 4/7 at [facility] ED.  Risk  factors include low-dose aspirin.   -Admit to a progressive bed -Clear liquid diet and n.p.o. after midnight -Continue transfusion of 2 units of packed red blood cells -Continue Protonix drip -Hold aspirin -Recheck H&H posttransfusion -Transfuse additional units of packed red blood cells as needed for hemoglobins less than 8 g/dL  Chronic atrial fibrillation: Patient currently in atrial fibrillation with heart rates into the 120s. -Continue to digoxin  Leukocytosis: WBC elevated at 20.3.  Patient denies any fever. -Continue to monitor  Essential hypertension: Blood pressure currently soft.   -Held home blood pressure medications at this time and we will need to reevaluate when medically appropriate to restart  HFpEF: Patient appears to be euvolemic at this time and no signs of fluid overload.  Last EF noted to be 45 to 50% in 01/2018. -Strict I&Os and daily weights -Consider giving IV Lasix if needed with blood transfusions   Diabetes mellitus type 2: On admission glucose 170.  Home medications include Januvia 50 mg daily, glipizide 10 mg bid, and Trulicity. -Hypoglycemic protocol -Hold Januvia and glipizide -CBGs before every meal with sensitive SSI  Peripheral artery disease: Patient status post redo of the left common femoral endarterectomy with profundoplasty and bovine pericardial patch angioplasty with left common femoral to below-knee popliteal artery bypass on 3/28 with Dr. Carlis Abbott. -Held aspirin due to acute bleed -Continue statin  CML: Patient followed in outpatient  setting by Dr. Alen Blew -Continue Nilotinib  Anxiety -Continue clorazepate as needed  Chronic kidney disease stage IIIa: Improved.  On admission creatinine 1.14, but previously had been 2.11 on 3/19. -Continue to monitor  Hypothyroidism: TSH 0.701 on 03/06/2021 on Care Everywhere. -Continue Synthroid  GERD: Home medications include Protonix 40 mg daily and Carafate 1 g 4 times daily  DVT prophylaxis: SCDs Code Status: Full Family Communication: Family updated at bedside Disposition Plan: Hopefully discharge home once medically stable Consults called: GI Admission status: Inpatient, require more than 2 midnight stay  Norval Morton MD [facility]   If 7PM-7AM, please contact night-coverage   03/10/2021, 12:31 PM

## 2021-03-10 NOTE — ED Notes (Signed)
Tech sent to get blood from blood bank.

## 2021-03-10 NOTE — ED Notes (Signed)
Patient is resting comfortably. 

## 2021-03-10 NOTE — ED Notes (Signed)
Patient denies pain and is resting comfortably.  

## 2021-03-10 NOTE — H&P (View-Only) (Signed)
Consultation  Referring Provider: No ref. provider found Primary Care Physician:  Cyndi Bender, PA-C Primary Gastroenterologist:  Dallas Breeding  inpt 2021  Reason for Consultation: Severe anemia, heme positive stool, question melena  HPI: Sara Warner is a 80 y.o. female who presented to the emergency room earlier today brought in by EMS with complaints of progressive weakness.  Found to be in A. fib with RVR, and labs revealed hemoglobin of 5.7/hematocrit of 19.1, WBC 20.3, INR 1.3, BUN 37 creatinine 1.14  Patient says she was constipated when she left the hospital after her recent admission, and had taken prune juice this past Tuesday 6 days ago.  Apparently she passed a lot of black tarry stool on Wednesday and then has not had any bowel movement since. She denies any abdominal pain, no nausea or vomiting no heartburn or indigestion.   Patient had apparently been in the emergency room in [place] on 03/06/2020 with complaints of weakness and was found to have a hemoglobin of 7.1, heme positive stool.  She was transfused 1 unit of packed RBCs and discharged from the ER and advised to have endoscopic evaluation. Patient was just discharged from [facility] on [facility]/26/2021 after admission for left lower extremity bypass graft occlusion.  She underwent redo left common femoral endarterectomy profundoplasty and bovine pericardial patch as well as left common femoral popliteal artery bypass. She was discharged home on baby aspirin. Review of labs shows on 02/24/2021 hemoglobin was 10.5 hematocrit of 31.0, on 02/25/2021 hemoglobin 9.0 hematocrit of 27.9.  Patient had undergone work-up in May 2021 while she was hospitalized for heme positive stool and anemia.  She had EGD per Dr. Lyndel Safe which showed a small hiatal hernia and moderate gastroduodenitis. Colonoscopy at that same setting with removal of 2 4 to 6 mm polyps and noted to have a few sigmoid diverticuli and internal and external  hemorrhoids.  Other medical problems include nonischemic cardiomyopathy with EF of about 40%, renal artery stenosis, atrial fibrillation, CML, and chronic kidney disease stage III.   Past Medical History:  Diagnosis Date  . Atrial fibrillation ([place])   . Chronic atrial fibrillation ([place])    a. Refuses [place].  CHA2DS2VASc = 4.  . Chronic systolic CHF (congestive heart failure) ([place])    a. 05/2015 Echo: EF 35-40%.  . CKD (chronic kidney disease), stage III ([place])   . 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 DM (diabetes mellitus) ([place])   . Dysrhythmia   . Elevated WBC count   . Epistaxis 04/03/2020   AFTER COVID SWAB  . Hypertension   . Hypertensive heart disease   . Hypothyroidism   . NICM (nonischemic cardiomyopathy) ([place])    a. 06/2013 MV: low risk study w/o ischemia;  b. 05/2015 Echo: EF 35-40%, diff HK, basal-midinferoseptal and basal-midanteroseptal AK. Mild-mod MR, mod dil LA, mild to mod TR, PASP 20mmHg.  Marland Kitchen PAD (peripheral artery disease) ([place])   . Pneumonia    30 yrs. ago  . Renal artery stenosis ([facility])    s/p left renal artery stent 02/18/21    Past Surgical History:  Procedure Laterality Date  . ABDOMINAL AORTOGRAM W/LOWER EXTREMITY Bilateral 12/06/2019   Procedure: ABDOMINAL AORTOGRAM W/LOWER EXTREMITY;  Surgeon: [name], MD;  Location: [place] [facility];  Service: Cardiovascular;  Laterality: Bilateral;  . ABDOMINAL AORTOGRAM W/LOWER EXTREMITY Bilateral 05/15/2020   Procedure: ABDOMINAL AORTOGRAM W/LOWER EXTREMITY;  Surgeon: [name], MD;  Location: [place] [facility];  Service: Cardiovascular;  Laterality: Bilateral;  . ABDOMINAL AORTOGRAM W/LOWER EXTREMITY N/A 02/18/2021   Procedure: ABDOMINAL AORTOGRAM W/LOWER EXTREMITY;  Surgeon: Serafina Mitchell, MD;  Location: [place] [facility];  Service: Cardiovascular;  Laterality: N/A;  . ABDOMINAL HYSTERECTOMY    . APPLICATION OF WOUND VAC  01/10/2020   BLEEDING FROM FEMORAL BYPASS  . BIOPSY   04/08/2020   Procedure: BIOPSY;  Surgeon: Jackquline Denmark, MD;  [facility] ENDOSCOPY;  Service: Endoscopy;;  . CARDIOVERSION    . COLONOSCOPY WITH PROPOFOL N/A 04/08/2020   Procedure: COLONOSCOPY WITH PROPOFOL;  Surgeon: Jackquline Denmark, MD;  Location: [facility] ENDOSCOPY;  Service: Endoscopy;  Laterality: N/A;  . CYST EXCISION     removed on right ovary  . CYSTECTOMY    . ENDARTERECTOMY FEMORAL Left 06/20/2020   Procedure: LEFT COMMON FEMORAL ENDARTERECTOMY LEFT ABOVE KNEE POPLITEAL ENDARTERECTOMY;  Surgeon: [name], MD;  Location: [place];  Service: Vascular;  Laterality: Left;  . ENDARTERECTOMY FEMORAL Left 02/24/2021   Procedure: REDO LEFT COMMON FEMORAL ENDARTERECTOMY AND PROFUNDAPLASTY WTH BOVINE PATCH;  Surgeon: [name], MD;  Location: [place];  Service: Vascular;  Laterality: Left;  . ESOPHAGOGASTRODUODENOSCOPY (EGD) WITH PROPOFOL N/A 04/08/2020   Procedure: ESOPHAGOGASTRODUODENOSCOPY (EGD) WITH PROPOFOL;  Surgeon: Jackquline Denmark, MD;  Location: [facility] ENDOSCOPY;  Service: Endoscopy;  Laterality: N/A;  . FEMORAL ARTERY - POPLITEAL ARTERY BYPASS GRAFT  12/15/2019   Right common femoral artery to below-knee popliteal artery bypass with ipsilateral nonreversed great saphenous vein  . FEMORAL-POPLITEAL BYPASS GRAFT Right 12/15/2019   Procedure: BYPASS GRAFT FEMORAL-POPLITEAL ARTERY;  Surgeon: [name], MD;  Location: [place];  Service: Vascular;  Laterality: Right;  . FEMORAL-POPLITEAL BYPASS GRAFT Left 06/20/2020   Procedure: BYPASS GRAFT FEMORAL TO ABOVE KNEE POPLITEAL ARTERY WITH HARVENSTED NON REVERSED GREATER SAPHENOUS VEIN;  Surgeon: [name], MD;  Location: [place];  Service: Vascular;  Laterality: Left;  . FEMORAL-POPLITEAL BYPASS GRAFT Left 02/24/2021   Procedure: REDO LEFT COMMON FEMORAL ARTERY EXPOSURE AND LEFT COMMON FEMORAL ARTERY TO BELOW KNEE POPLITEAL ARTERY BYPASS GRAFT WITH PROPATEN VASCULAR GRAFT;  Surgeon: [name], MD;  Location: [place];   Service: Vascular;  Laterality: Left;  . HYSTEROTOMY    . PERIPHERAL VASCULAR INTERVENTION Right 12/06/2019   Procedure: PERIPHERAL VASCULAR INTERVENTION;  Surgeon: [name], MD;  Location: [place] [facility];  Service: Cardiovascular;  Laterality: Right;  EXT ILIAC  . PERIPHERAL VASCULAR INTERVENTION Left 02/18/2021   Procedure: PERIPHERAL VASCULAR INTERVENTION;  Surgeon: Serafina Mitchell, MD;  Location: [place] [facility];  Service: Cardiovascular;  Laterality: Left;  renal artery  . POLYPECTOMY  04/08/2020   Procedure: POLYPECTOMY;  Surgeon: Jackquline Denmark, MD;  Location: [facility] ENDOSCOPY;  Service: Endoscopy;;  . WOUND EXPLORATION Right 01/10/2020   Procedure: right lower leg Wound Exploration with wound vac application;  Surgeon: Serafina Mitchell, MD;  Location: [facility] OR;  Service: Vascular;  Laterality: Right;    Prior to Admission medications   Medication Sig Start Date End Date Taking? Authorizing Provider  aspirin EC 81 MG tablet Take 1 tablet (81 mg total) by mouth daily. Patient not taking: No sig reported 12/17/19   Gabriel Earing, PA-C  cetirizine (ZYRTEC) 10 MG tablet Take 10 mg by mouth daily.    [provider]  clorazepate (TRANXENE-T) 7.5 MG tablet Take 1 tablet (7.5 mg total) by mouth 2 (two) times daily as needed for anxiety. 01/10/13   Maryanna Shape, NP  digoxin (LANOXIN) 0.125 MG  tablet Take 0.5 tablets (0.0625 mg total) by mouth every other day. 10/30/19   Troy Sine, MD  Dulaglutide 3 MG/0.5ML SOPN Inject 3 mg into the skin once a week.    [provider]  furosemide (LASIX) 20 MG tablet TAKE 2 TABLETS BY MOUTH IN THE MORNING AND AN EXTRA TABLET IF GAIN 3 OR MORE POUNDS IN 1 DAY Patient taking differently: Take 20-40 mg by mouth See admin instructions. Take 40 mg in the morning, may take an additional 20 mg for weight gain of 3lbs or more in one day 10/02/20   Troy Sine, MD  glipiZIDE (GLUCOTROL XL) 10 MG 24 hr tablet Take 10 mg by mouth 2  (two) times daily.  07/07/14   [provider]  levothyroxine (SYNTHROID) 125 MCG tablet Take 125 mcg by mouth daily before breakfast. 11/15/19   [provider]  metoprolol (TOPROL-XL) 200 MG 24 hr tablet TAKE 1 TABLET BY MOUTH EVERY DAY Patient taking differently: Take 200 mg by mouth daily. 09/23/20   Troy Sine, MD  nilotinib (TASIGNA) 150 MG capsule Take 2 capsules (300 mg total) by mouth every 12 (twelve) hours. Take on an empty stomach, 1 hour before or 2 hours after meals. 12/24/20   Wyatt Portela, MD  oxyCODONE (ROXICODONE) 5 MG immediate release tablet Take 1 tablet (5 mg total) by mouth every 8 (eight) hours as needed. Patient taking differently: Take 2.5-5 mg by mouth every 6 (six) hours as needed for moderate pain. 06/22/20 06/22/21  Setzer, Edman Circle, PA-C  oxyCODONE-acetaminophen (PERCOCET) 5-325 MG tablet Take 1 tablet by mouth every 6 (six) hours as needed. 02/26/21   Rhyne, Hulen Shouts, PA-C  rosuvastatin (CRESTOR) 5 MG tablet Take 2 tablets (10 mg total) by mouth daily. 02/26/21 02/26/22  Rhyne, Hulen Shouts, PA-C  sitaGLIPtin (JANUVIA) 50 MG tablet Take 50 mg by mouth daily.  07/13/16   [provider]  spironolactone (ALDACTONE) 25 MG tablet Take 0.5 tablets (12.5 mg total) by mouth 2 (two) times daily. Patient taking differently: Take 25 mg by mouth every evening. 01/24/18 11/27/20  Troy Sine, MD    Current Facility-Administered Medications  Medication Dose Route Frequency Provider Last Rate Last Admin  . 0.9 %  sodium chloride infusion  10 mL/hr Intravenous Once Smith, Rondell A, MD      . albuterol (PROVENTIL) (2.5 MG/3ML) 0.083% nebulizer solution 2.5 mg  2.5 mg Nebulization Q6H PRN Smith, Rondell A, MD      . pantoprazole (PROTONIX) 80 mg in sodium chloride 0.9 % 100 mL (0.8 mg/mL) infusion  8 mg/hr Intravenous Continuous Smith, Rondell A, MD      . pantoprazole (PROTONIX) 80 mg in sodium chloride 0.9 % 100 mL IVPB  80 mg Intravenous Once Smith,  Rondell A, MD      . sodium chloride flush (NS) 0.9 % injection 3 mL  3 mL Intravenous Q12H Norval Morton, MD       Current Outpatient Medications  Medication Sig Dispense Refill  . aspirin EC 81 MG tablet Take 1 tablet (81 mg total) by mouth daily. (Patient not taking: No sig reported)    . cetirizine (ZYRTEC) 10 MG tablet Take 10 mg by mouth daily.    . clorazepate (TRANXENE-T) 7.5 MG tablet Take 1 tablet (7.5 mg total) by mouth 2 (two) times daily as needed for anxiety. 30 tablet 1  . digoxin (LANOXIN) 0.125 MG tablet Take 0.5 tablets (0.0625 mg total) by mouth  every other day. 45 tablet 1  . Dulaglutide 3 MG/0.5ML SOPN Inject 3 mg into the skin once a week.    . furosemide (LASIX) 20 MG tablet TAKE 2 TABLETS BY MOUTH IN THE MORNING AND AN EXTRA TABLET IF GAIN 3 OR MORE POUNDS IN 1 DAY (Patient taking differently: Take 20-40 mg by mouth See admin instructions. Take 40 mg in the morning, may take an additional 20 mg for weight gain of 3lbs or more in one day) 270 tablet 1  . glipiZIDE (GLUCOTROL XL) 10 MG 24 hr tablet Take 10 mg by mouth 2 (two) times daily.     Marland Kitchen levothyroxine (SYNTHROID) 125 MCG tablet Take 125 mcg by mouth daily before breakfast.    . metoprolol (TOPROL-XL) 200 MG 24 hr tablet TAKE 1 TABLET BY MOUTH EVERY DAY (Patient taking differently: Take 200 mg by mouth daily.) 90 tablet 3  . nilotinib (TASIGNA) 150 MG capsule Take 2 capsules (300 mg total) by mouth every 12 (twelve) hours. Take on an empty stomach, 1 hour before or 2 hours after meals. 120 capsule 0  . oxyCODONE (ROXICODONE) 5 MG immediate release tablet Take 1 tablet (5 mg total) by mouth every 8 (eight) hours as needed. (Patient taking differently: Take 2.5-5 mg by mouth every 6 (six) hours as needed for moderate pain.) 20 tablet 0  . oxyCODONE-acetaminophen (PERCOCET) 5-325 MG tablet Take 1 tablet by mouth every 6 (six) hours as needed. 20 tablet 0  . rosuvastatin (CRESTOR) 5 MG tablet Take 2 tablets (10 mg total)  by mouth daily. 30 tablet 11  . sitaGLIPtin (JANUVIA) 50 MG tablet Take 50 mg by mouth daily.     Marland Kitchen spironolactone (ALDACTONE) 25 MG tablet Take 0.5 tablets (12.5 mg total) by mouth 2 (two) times daily. (Patient taking differently: Take 25 mg by mouth every evening.) 90 tablet 3    Allergies as of 03/10/2021 - Review Complete 03/10/2021  Allergen Reaction Noted  . Lisinopril Cough 02/11/2017  . Losartan Other (See Comments) 02/19/2017    Family History  Problem Relation Age of Onset  . Cancer - Colon Mother        mast to lungs  . Diabetes Father   . Diabetes Paternal Grandmother     Social History   Socioeconomic History  . Marital status: Single    Spouse name: Not on file  . Number of children: Not on file  . Years of education: Not on file  . Highest education level: Not on file  Occupational History  . Not on file  Tobacco Use  . Smoking status: Former Smoker    Types: Cigarettes    Quit date: 01/01/1984    Years since quitting: 37.2  . Smokeless tobacco: Never Used  Vaping Use  . Vaping Use: Never used  Substance and Sexual Activity  . Alcohol use: No  . Drug use: Never  . Sexual activity: Not on file  Other Topics Concern  . Not on file  Social History Narrative  . Not on file   Social Determinants of Health   Financial Resource Strain: Not on file  Food Insecurity: Not on file  Transportation Needs: Not on file  Physical Activity: Not on file  Stress: Not on file  Social Connections: Not on file  Intimate Partner Violence: Not on file    Review of Systems: Pertinent positive and negative review of systems were noted in the above HPI section.  All other review of systems was otherwise  negative.  Physical Exam: Vital signs in last 24 hours: Temp:  [97.6 F (36.4 C)-97.9 F (36.6 C)] 97.7 F (36.5 C) (04/11 1233) Pulse Rate:  [96-120] 120 (04/11 1223) Resp:  [16-28] 16 (04/11 1233) BP: (105-127)/(49-55) 127/55 (04/11 1233) SpO2:  [91 %-100 %] 94  % (04/11 1233) Weight:  [81.6 kg] 81.6 kg (04/11 1017)   General:   Alert,  Well-developed, pale elderly white female well-nourished, pleasant and cooperative in NAD.  Daughter at bedside Head:  Normocephalic and atraumatic. Eyes:  Sclera clear, no icterus.   Conjunctiva pale. Ears:  Normal auditory acuity. Nose:  No deformity, discharge,  or lesions. Mouth:  No deformity or lesions.   Neck:  Supple; no masses or thyromegaly. Lungs:  Clear throughout to auscultation.   No wheezes, crackles, or rhonchi.  Heart:  irRegular rate and rhythm; no murmurs, clicks, rubs,  or gallops. Abdomen:  Soft,nontender, BS active,nonpalp mass or hsm.   Rectal: Not done-heme positive dark stool in the ER [place] per notes Msk:  Symmetrical without gross deformities. . Pulses:  Normal pulses noted. Extremities:  Without clubbing or edema. Neurologic:  Alert and  oriented x4;  grossly normal neurologically. Skin:  Intact without significant lesions or rashes.. Psych:  Alert and cooperative. Normal mood and affect.  Intake/Output from previous day: No intake/output data recorded. Intake/Output this shift: Total I/O In: 2508.3 [I.V.:800; Blood:1000; IV Piggyback:708.3] Out: -   Lab Results: Recent Labs    03/10/21 1028  WBC 20.3*  HGB 5.7*  HCT 19.1*  PLT 523*   BMET Recent Labs    03/10/21 1028  NA 138  K 4.1  CL 112*  CO2 20*  GLUCOSE 170*  BUN 37*  CREATININE 1.14*  CALCIUM 8.3*   LFT Recent Labs    03/10/21 1028  PROT 5.3*  ALBUMIN 2.8*  AST 9*  ALT 11  ALKPHOS 42  BILITOT 0.8   PT/INR Recent Labs    03/10/21 1028  LABPROT 15.4*  INR 1.3*   Hepatitis Panel No results for input(s): HEPBSAG, HCVAB, HEPAIGM, HEPBIGM in the last 72 hours.    IMPRESSION:  #66 80 year old white female with acute probable upper GI bleed with 4 g drop in hemoglobin over the past 11 days. Patient had black stool at home last week but has been constipated and has not had a bowel  movement over the past couple of days. Hemoglobin 7.1 on 03/07/21 when in the ER /[place]-transfused 1 unit of packed RBCs Hemoglobin 9.0 on discharge from the hospital 02/25/2021  Etiology of bleeding is not clear, patient has only been on baby aspirin.  Rule out gastritis, peptic ulcer disease, AVMs  Doubt lower GI source by history, and fairly recent colonoscopy May 2021 with finding of a few diverticuli and 2 small polyps which were removed.  #2 CML 3.  Chronic kidney disease stage III 4.  Renal artery stenosis 5.  Nonischemic cardiomyopathy with EF around 40% #6 peripheral arterial disease, very recent admission with occlusion of left lower extremity bypass graft and underwent redo procedure on 02/24/2021  PLAN: Clear liquid diet, n.p.o. after midnight Transfuse 2 to 3 units of packed RBCs slowly IV PPI twice daily Serial hemoglobins Patient will be scheduled for EGD with Dr. Hilarie Fredrickson in a.m. tomorrow.  Procedure was discussed in detail with the patient and her daughter including indications risk and benefits and she is agreeable to proceed. If EGD is unrevealing consider capsule endoscopy. Thank you will follow with you  Lakeva Hollon PA-C 03/10/2021, 12:39 PM

## 2021-03-10 NOTE — ED Notes (Signed)
Care handoff given to floor RN

## 2021-03-10 NOTE — ED Provider Notes (Signed)
History [facility] EMERGENCY DEPARTMENT Provider Note   CSN: [id number] Arrival date & time: 03/10/21  1009     History Chief Complaint  Patient presents with  . Atrial Fibrillation    Family called EMS patient c/o general weakness, constipation x3 days, and decreased oral intake. EMS on scene noted Afib with RVR, gave 86ml of fluids, last heart rate 112.     Sara Warner is a 80 y.o. female.  HPI Patient presents feeling weak abdominal pain constipation.  Decreased oral intake.  Reviewing records was seen at [facility] emergency room 4 days ago with upper GI bleed and anemia with a hemoglobin of 7.  Transfused and sent home.  States she is had decreased rectal output since.  Increased oral intake.  Does have abdominal pain.  Found by EMS to be in atrial fibrillation with RVR.  Does have history of chronic atrial fibrillation.  Not on anticoagulation due to previous bleeds.  Also history of CML.  States she feels lightheaded.  States she was feeling good when she left the hospital.    Past Medical History:  Diagnosis Date  . Atrial fibrillation ([place])   . Chronic atrial fibrillation ([place])    a. Refuses [place].  CHA2DS2VASc = 4.  . Chronic systolic CHF (congestive heart failure) ([place])    a. 05/2015 Echo: EF 35-40%.  . CKD (chronic kidney disease), stage III ([place])   . 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 DM (diabetes mellitus) ([place])   . Dysrhythmia   . Elevated WBC count   . Epistaxis 04/03/2020   AFTER COVID SWAB  . Hypertension   . Hypertensive heart disease   . Hypothyroidism   . NICM (nonischemic cardiomyopathy) ([place])    a. 06/2013 MV: low risk study w/o ischemia;  b. 05/2015 Echo: EF 35-40%, diff HK, basal-midinferoseptal and basal-midanteroseptal AK. Mild-mod MR, mod dil LA, mild to mod TR, PASP 32mmHg.  Marland Kitchen PAD (peripheral artery disease) ([place])   . Pneumonia    30 yrs. ago  . Renal artery stenosis [facility])    s/p left renal artery stent 02/18/21     Patient Active Problem List   Diagnosis Date Noted  . Renal artery stenosis ([place]) 10/22/2020  . Peripheral arterial disease ([place]) 06/20/2020  . Iron deficiency anemia 04/22/2020  . Epistaxis   . Occult blood in stools   . Antiplatelet or antithrombotic long-term use   . Symptomatic anemia 04/03/2020  . 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 PAD (peripheral artery disease) ([place]) 12/15/2019  . Critical lower limb ischemia ([place]) 11/28/2019  . Bilateral impacted cerumen 06/23/2017  . 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 NICM (nonischemic cardiomyopathy) ([place])   . Chronic atrial fibrillation ([place])   . Hypertensive heart disease   . Stage III chronic kidney disease ([place]) 10/02/2015  . Controlled type 2 diabetes mellitus without complication ([place]) 16/60/6301  . 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 Atrial fibrillation ([place]) 01/05/2012  . Hypothyroid 01/05/2012  . Cardiomyopathy ([place]) 01/05/2012    Past Surgical History:  Procedure Laterality Date  . ABDOMINAL AORTOGRAM W/LOWER EXTREMITY Bilateral 12/06/2019   Procedure: ABDOMINAL AORTOGRAM W/LOWER EXTREMITY;  Surgeon: [name], MD;  Location: [place] CV LAB;  Service: Cardiovascular;  Laterality: Bilateral;  . ABDOMINAL AORTOGRAM W/LOWER EXTREMITY Bilateral 05/15/2020   Procedure: ABDOMINAL AORTOGRAM W/LOWER EXTREMITY;  Surgeon: [name], MD;  Location: [place] CV LAB;  Service:  Cardiovascular;  Laterality: Bilateral;  . ABDOMINAL AORTOGRAM W/LOWER EXTREMITY N/A 02/18/2021   Procedure: ABDOMINAL AORTOGRAM W/LOWER EXTREMITY;  Surgeon: Serafina Mitchell, MD;  Location: [place] CV LAB;  Service: Cardiovascular;  Laterality: N/A;  . ABDOMINAL HYSTERECTOMY    . APPLICATION OF WOUND VAC  01/10/2020   BLEEDING FROM FEMORAL BYPASS  . BIOPSY  04/08/2020   Procedure: BIOPSY;  Surgeon: Jackquline Denmark, MD;  Location[facility] ENDOSCOPY;   Service: Endoscopy;;  . CARDIOVERSION    . COLONOSCOPY WITH PROPOFOL N/A 04/08/2020   Procedure: COLONOSCOPY WITH PROPOFOL;  Surgeon: Jackquline Denmark, MD;  Location: [facility] ENDOSCOPY;  Service: Endoscopy;  Laterality: N/A;  . CYST EXCISION     removed on right ovary  . CYSTECTOMY    . ENDARTERECTOMY FEMORAL Left 06/20/2020   Procedure: LEFT COMMON FEMORAL ENDARTERECTOMY LEFT ABOVE KNEE POPLITEAL ENDARTERECTOMY;  Surgeon: [name], MD;  Location: [place];  Service: Vascular;  Laterality: Left;  . ENDARTERECTOMY FEMORAL Left 02/24/2021   Procedure: REDO LEFT COMMON FEMORAL ENDARTERECTOMY AND PROFUNDAPLASTY WTH BOVINE PATCH;  Surgeon: [name], MD;  Location: [place];  Service: Vascular;  Laterality: Left;  . ESOPHAGOGASTRODUODENOSCOPY (EGD) WITH PROPOFOL N/A 04/08/2020   Procedure: ESOPHAGOGASTRODUODENOSCOPY (EGD) WITH PROPOFOL;  Surgeon: Jackquline Denmark, MD;  Location: [facility] ENDOSCOPY;  Service: Endoscopy;  Laterality: N/A;  . FEMORAL ARTERY - POPLITEAL ARTERY BYPASS GRAFT  12/15/2019   Right common femoral artery to below-knee popliteal artery bypass with ipsilateral nonreversed great saphenous vein  . FEMORAL-POPLITEAL BYPASS GRAFT Right 12/15/2019   Procedure: BYPASS GRAFT FEMORAL-POPLITEAL ARTERY;  Surgeon: [name], MD;  Location: [facility];  Service: Vascular;  Laterality: Right;  . FEMORAL-POPLITEAL BYPASS GRAFT Left 06/20/2020   Procedure: BYPASS GRAFT FEMORAL TO ABOVE KNEE POPLITEAL ARTERY WITH HARVENSTED NON REVERSED GREATER SAPHENOUS VEIN;  Surgeon: [name], MD;  Location: [place];  Service: Vascular;  Laterality: Left;  . FEMORAL-POPLITEAL BYPASS GRAFT Left 02/24/2021   Procedure: REDO LEFT COMMON FEMORAL ARTERY EXPOSURE AND LEFT COMMON FEMORAL ARTERY TO BELOW KNEE POPLITEAL ARTERY BYPASS GRAFT WITH PROPATEN VASCULAR GRAFT;  Surgeon: [name], MD;  Location: [place];  Service: Vascular;  Laterality: Left;  . HYSTEROTOMY    . PERIPHERAL VASCULAR  INTERVENTION Right 12/06/2019   Procedure: PERIPHERAL VASCULAR INTERVENTION;  Surgeon: [name], MD;  Location: [place] CV LAB;  Service: Cardiovascular;  Laterality: Right;  EXT ILIAC  . PERIPHERAL VASCULAR INTERVENTION Left 02/18/2021   Procedure: PERIPHERAL VASCULAR INTERVENTION;  Surgeon: Serafina Mitchell, MD;  Location: [place] CV LAB;  Service: Cardiovascular;  Laterality: Left;  renal artery  . POLYPECTOMY  04/08/2020   Procedure: POLYPECTOMY;  Surgeon: Jackquline Denmark, MD;  Location: [facility] ENDOSCOPY;  Service: Endoscopy;;  . WOUND EXPLORATION Right 01/10/2020   Procedure: right lower leg Wound Exploration with wound vac application;  Surgeon: Serafina Mitchell, MD;  Location: [facility] OR;  Service: Vascular;  Laterality: Right;     OB History   No obstetric history on file.     Family History  Problem Relation Age of Onset  . Cancer - Colon Mother        mast to lungs  . Diabetes Father   . Diabetes Paternal Grandmother     Social History   Tobacco Use  . Smoking status: Former Smoker    Types: Cigarettes    Quit date: 01/01/1984    Years since quitting: 37.2  . Smokeless tobacco: Never Used  Vaping Use  .  Vaping Use: Never used  Substance Use Topics  . Alcohol use: No  . Drug use: Never    Home Medications Prior to Admission medications   Medication Sig Start Date End Date Taking? Authorizing Provider  aspirin EC 81 MG tablet Take 1 tablet (81 mg total) by mouth daily. Patient not taking: No sig reported 12/17/19   Gabriel Earing, PA-C  cetirizine (ZYRTEC) 10 MG tablet Take 10 mg by mouth daily.    [provider]  clorazepate (TRANXENE-T) 7.5 MG tablet Take 1 tablet (7.5 mg total) by mouth 2 (two) times daily as needed for anxiety. 01/10/13   Maryanna Shape, NP  digoxin (LANOXIN) 0.125 MG tablet Take 0.5 tablets (0.0625 mg total) by mouth every other day. 10/30/19   Troy Sine, MD  Dulaglutide 3 MG/0.5ML SOPN Inject 3 mg into the skin once a  week.    [provider]  furosemide (LASIX) 20 MG tablet TAKE 2 TABLETS BY MOUTH IN THE MORNING AND AN EXTRA TABLET IF GAIN 3 OR MORE POUNDS IN 1 DAY Patient taking differently: Take 20-40 mg by mouth See admin instructions. Take 40 mg in the morning, may take an additional 20 mg for weight gain of 3lbs or more in one day 10/02/20   Troy Sine, MD  glipiZIDE (GLUCOTROL XL) 10 MG 24 hr tablet Take 10 mg by mouth 2 (two) times daily.  07/07/14   [provider]  levothyroxine (SYNTHROID) 125 MCG tablet Take 125 mcg by mouth daily before breakfast. 11/15/19   [provider]  metoprolol (TOPROL-XL) 200 MG 24 hr tablet TAKE 1 TABLET BY MOUTH EVERY DAY Patient taking differently: Take 200 mg by mouth daily. 09/23/20   Troy Sine, MD  nilotinib (TASIGNA) 150 MG capsule Take 2 capsules (300 mg total) by mouth every 12 (twelve) hours. Take on an empty stomach, 1 hour before or 2 hours after meals. 12/24/20   Wyatt Portela, MD  oxyCODONE (ROXICODONE) 5 MG immediate release tablet Take 1 tablet (5 mg total) by mouth every 8 (eight) hours as needed. Patient taking differently: Take 2.5-5 mg by mouth every 6 (six) hours as needed for moderate pain. 06/22/20 06/22/21  Setzer, Edman Circle, PA-C  oxyCODONE-acetaminophen (PERCOCET) 5-325 MG tablet Take 1 tablet by mouth every 6 (six) hours as needed. 02/26/21   Rhyne, Hulen Shouts, PA-C  rosuvastatin (CRESTOR) 5 MG tablet Take 2 tablets (10 mg total) by mouth daily. 02/26/21 02/26/22  Rhyne, Hulen Shouts, PA-C  sitaGLIPtin (JANUVIA) 50 MG tablet Take 50 mg by mouth daily.  07/13/16   [provider]  spironolactone (ALDACTONE) 25 MG tablet Take 0.5 tablets (12.5 mg total) by mouth 2 (two) times daily. Patient taking differently: Take 25 mg by mouth every evening. 01/24/18 11/27/20  Troy Sine, MD    Allergies    Lisinopril and Losartan  Review of Systems   Review of Systems  Constitutional: Positive for appetite change and  fatigue.  HENT: Negative for congestion.   Respiratory: Positive for shortness of breath.   Cardiovascular: Positive for palpitations. Negative for chest pain.  Gastrointestinal: Positive for abdominal pain, blood in stool, constipation and nausea.  Genitourinary: Negative for dysuria.  Musculoskeletal: Negative for back pain.  Skin: Negative for rash.  Neurological: Positive for weakness.    Physical Exam Updated Vital Signs BP (!) 127/54 (BP Location: Left Arm)   Pulse (!) 111   Temp 97.7 F (36.5 C) (Oral)   Resp  18   Ht 5\' 8"  (1.727 m)   Wt 81.6 kg   SpO2 94%   BMI 27.37 kg/m   Physical Exam Vitals and nursing note reviewed.  HENT:     Head: Atraumatic.  Eyes:     Pupils: Pupils are equal, round, and reactive to light.  Cardiovascular:     Rate and Rhythm: Tachycardia present. Rhythm irregular.  Pulmonary:     Breath sounds: No wheezing or rhonchi.  Abdominal:     General: There is distension.     Tenderness: There is abdominal tenderness.  Musculoskeletal:        General: No tenderness.  Skin:    Capillary Refill: Capillary refill takes less than 2 seconds.     Coloration: Skin is pale.  Neurological:     Mental Status: She is alert and oriented to person, place, and time.     ED Results / Procedures / Treatments   Labs (all labs ordered are listed, but only abnormal results are displayed) Labs Reviewed  COMPREHENSIVE METABOLIC PANEL - Abnormal; Notable for the following components:      Result Value   Chloride 112 (*)    CO2 20 (*)    Glucose, Bld 170 (*)    BUN 37 (*)    Creatinine, Ser 1.14 (*)    Calcium 8.3 (*)    Total Protein 5.3 (*)    Albumin 2.8 (*)    AST 9 (*)    GFR, Estimated 49 (*)    All other components within normal limits  PROTIME-INR - Abnormal; Notable for the following components:   Prothrombin Time 15.4 (*)    INR 1.3 (*)    All other components within normal limits  CBC WITH DIFFERENTIAL/PLATELET - Abnormal; Notable for  the following components:   WBC 20.3 (*)    RBC 1.99 (*)    Hemoglobin 5.7 (*)    HCT 19.1 (*)    MCHC 29.8 (*)    RDW 18.3 (*)    Platelets 523 (*)    nRBC 1.1 (*)    Neutro Abs 18.3 (*)    All other components within normal limits  RESP PANEL BY RT-PCR (FLU A&B, COVID) ARPGX2  DIGOXIN LEVEL  TYPE AND SCREEN  PREPARE RBC (CROSSMATCH)    EKG EKG Interpretation  Date/Time:  Monday March 10 2021 10:14:00 EDT Ventricular Rate:  121 PR Interval:    QRS Duration: 91 QT Interval:  318 QTC Calculation: 452 R Axis:   15 Text Interpretation: Atrial fibrillation Nonspecific repol abnormality, lateral leads Confirmed by Davonna Belling [phone]) on 03/10/2021 10:17:21 AM   Radiology DG Abdomen Acute W/Chest  Result Date: 03/10/2021 CLINICAL DATA:  Abdominal pain and fatigue EXAM: DG ABDOMEN ACUTE WITH 1 VIEW CHEST COMPARISON:  CT abdomen and pelvis March 04, 2016 FINDINGS: PA chest: The lungs are clear. There is cardiomegaly with pulmonary vascularity normal. No adenopathy. There is aortic atherosclerosis. Supine and upright abdomen: There is fairly diffuse stool throughout the colon. There is no bowel dilatation or air-fluid level to suggest bowel obstruction. No free air. Right iliac artery region stent noted. Probable phleboliths in the pelvis. Surgical clips overlie the left acetabulum. IMPRESSION: Fairly diffuse stool throughout colon. No bowel obstruction or free air evident. Lungs clear. Cardiomegaly. Aortic Atherosclerosis (ICD10-I70.0). Electronically Signed   By: Lowella Grip III M.D.   On: 03/10/2021 11:35    Procedures Procedures   Medications Ordered in ED Medications  pantoprazole (PROTONIX) 80 mg in sodium  chloride 0.9 % 100 mL IVPB (has no administration in time range)  pantoprazole (PROTONIX) 80 mg in sodium chloride 0.9 % 100 mL (0.8 mg/mL) infusion (has no administration in time range)  0.9 %  sodium chloride infusion (has no administration in time range)   sodium chloride 0.9 % bolus 500 mL (500 mLs Intravenous New Bag/Given 03/10/21 1053)    ED Course  I have reviewed the triage vital signs and the nursing notes.  Pertinent labs & imaging results that were available during my care of the patient were reviewed by me and considered in my medical decision making (see chart for details).    MDM Rules/Calculators/A&P                          Patient presents with lightheadedness abdominal pain found to be in atrial fibrillation with RVR.  Chronic A. fib.  Not on anticoagulation.  However hemoglobin found to be 5.7.  Of GI bleeds and was just seen at [facility] a few days ago and transfused at that time.  Presumed upper GI bleed at that time guaiac positive melena.  However discharged home for outpatient follow-up.  Continues to worsen.  Patient is in A. fib but only mildly rapid rate.  Some dull abdominal pain but x-rays reassuring.  White count is elevated but appears to be at her baseline with her CML.  Will transfuse blood.  Will consult with our GI who is seen patient in the past.  Admit to internal medicine.  Patient's PCP is Cyndi Bender is unassigned here.  CRITICAL CARE Performed by: Davonna Belling Total critical care time:30 sister called cell minutes Critical care time was exclusive of separately billable procedures and treating other patients. Critical care was necessary to treat or prevent imminent or life-threatening deterioration. Critical care was time spent personally by me on the following activities: development of treatment plan with patient and/or surrogate as well as nursing, discussions with consultants, evaluation of patient's response to treatment, examination of patient, obtaining history from patient or surrogate, ordering and performing treatments and interventions, ordering and review of laboratory studies, ordering and review of radiographic studies, pulse oximetry and re-evaluation of patient's  condition. I Final Clinical Impression(s) / ED Diagnoses Final diagnoses:  Upper GI bleed  Acute blood loss anemia  Atrial fibrillation with rapid ventricular response [facility])    Rx / DC Orders ED Discharge Orders    None       Davonna Belling, MD 03/10/21 1210

## 2021-03-10 NOTE — Progress Notes (Signed)
Pt arrived from ED, VSS, Call light within reach, CHG complete.  Chrisandra Carota, RN  03/10/2021

## 2021-03-10 NOTE — Consult Note (Signed)
Consultation  Referring Provider: No ref. provider found Primary Care Physician:  Cyndi Bender, PA-C Primary Gastroenterologist:  Dallas Breeding  inpt 2021  Reason for Consultation: Severe anemia, heme positive stool, question melena  HPI: Sara Warner is a 80 y.o. female who presented to the emergency room earlier today brought in by EMS with complaints of progressive weakness.  Found to be in A. fib with RVR, and labs revealed hemoglobin of 5.7/hematocrit of 19.1, WBC 20.3, INR 1.3, BUN 37 creatinine 1.14  Patient says she was constipated when she left the hospital after her recent admission, and had taken prune juice this past Tuesday 6 days ago.  Apparently she passed a lot of black tarry stool on Wednesday and then has not had any bowel movement since. She denies any abdominal pain, no nausea or vomiting no heartburn or indigestion.   Patient had apparently been in the emergency room in [place] on 03/06/2020 with complaints of weakness and was found to have a hemoglobin of 7.1, heme positive stool.  She was transfused 1 unit of packed RBCs and discharged from the ER and advised to have endoscopic evaluation. Patient was just discharged from [facility] on 02/26/2021 after admission for left lower extremity bypass graft occlusion.  She underwent redo left common femoral endarterectomy profundoplasty and bovine pericardial patch as well as left common femoral popliteal artery bypass. She was discharged home on baby aspirin. Review of labs shows on 02/24/2021 hemoglobin was 10.5 hematocrit of 31.0, on 02/25/2021 hemoglobin 9.0 hematocrit of 27.9.  Patient had undergone work-up in May 2021 while she was hospitalized for heme positive stool and anemia.  She had EGD per Dr. Lyndel Safe which showed a small hiatal hernia and moderate gastroduodenitis. Colonoscopy at that same setting with removal of 2 4 to 6 mm polyps and noted to have a few sigmoid diverticuli and internal and external  hemorrhoids.  Other medical problems include nonischemic cardiomyopathy with EF of about 40%, renal artery stenosis, atrial fibrillation, CML, and chronic kidney disease stage III.   Past Medical History:  Diagnosis Date  . Atrial fibrillation ([place])   . Chronic atrial fibrillation ([place])    a. Refuses [place].  CHA2DS2VASc = 4.  . Chronic systolic CHF (congestive heart failure) ([place])    a. 05/2015 Echo: EF 35-40%.  . CKD (chronic kidney disease), stage III ([place])   . 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 DM (diabetes mellitus) ([place])   . Dysrhythmia   . Elevated WBC count   . Epistaxis 04/03/2020   AFTER COVID SWAB  . Hypertension   . Hypertensive heart disease   . Hypothyroidism   . NICM (nonischemic cardiomyopathy) ([place])    a. 06/2013 MV: low risk study w/o ischemia;  b. 05/2015 Echo: EF 35-40%, diff HK, basal-midinferoseptal and basal-midanteroseptal AK. Mild-mod MR, mod dil LA, mild to mod TR, PASP 32mmHg.  Marland Kitchen PAD (peripheral artery disease) ([place])   . Pneumonia    30 yrs. ago  . Renal artery stenosis ([facility])    s/p left renal artery stent 02/18/21    Past Surgical History:  Procedure Laterality Date  . ABDOMINAL AORTOGRAM W/LOWER EXTREMITY Bilateral 12/06/2019   Procedure: ABDOMINAL AORTOGRAM W/LOWER EXTREMITY;  Surgeon: [name], MD;  Location: [place] [facility];  Service: Cardiovascular;  Laterality: Bilateral;  . ABDOMINAL AORTOGRAM W/LOWER EXTREMITY Bilateral 05/15/2020   Procedure: ABDOMINAL AORTOGRAM W/LOWER EXTREMITY;  Surgeon: [name], MD;  Location: [place] [facility];  Service: Cardiovascular;  Laterality: Bilateral;  . ABDOMINAL AORTOGRAM W/LOWER EXTREMITY N/A 02/18/2021   Procedure: ABDOMINAL AORTOGRAM W/LOWER EXTREMITY;  Surgeon: Serafina Mitchell, MD;  Location: [place] [facility];  Service: Cardiovascular;  Laterality: N/A;  . ABDOMINAL HYSTERECTOMY    . APPLICATION OF WOUND VAC  01/10/2020   BLEEDING FROM FEMORAL BYPASS  . BIOPSY   04/08/2020   Procedure: BIOPSY;  Surgeon: Jackquline Denmark, MD;  Location: [facility] ENDOSCOPY;  Service: Endoscopy;;  . CARDIOVERSION    . COLONOSCOPY WITH PROPOFOL N/A 04/08/2020   Procedure: COLONOSCOPY WITH PROPOFOL;  Surgeon: Jackquline Denmark, MD;  Location: [facility] ENDOSCOPY;  Service: Endoscopy;  Laterality: N/A;  . CYST EXCISION     removed on right ovary  . CYSTECTOMY    . ENDARTERECTOMY FEMORAL Left 06/20/2020   Procedure: LEFT COMMON FEMORAL ENDARTERECTOMY LEFT ABOVE KNEE POPLITEAL ENDARTERECTOMY;  Surgeon: [name], MD;  Location: [place];  Service: Vascular;  Laterality: Left;  . ENDARTERECTOMY FEMORAL Left 02/24/2021   Procedure: REDO LEFT COMMON FEMORAL ENDARTERECTOMY AND PROFUNDAPLASTY WTH BOVINE PATCH;  Surgeon: [name], MD;  Location: [place];  Service: Vascular;  Laterality: Left;  . ESOPHAGOGASTRODUODENOSCOPY (EGD) WITH PROPOFOL N/A 04/08/2020   Procedure: ESOPHAGOGASTRODUODENOSCOPY (EGD) WITH PROPOFOL;  Surgeon: Jackquline Denmark, MD;  Location: [facility] ENDOSCOPY;  Service: Endoscopy;  Laterality: N/A;  . FEMORAL ARTERY - POPLITEAL ARTERY BYPASS GRAFT  12/15/2019   Right common femoral artery to below-knee popliteal artery bypass with ipsilateral nonreversed great saphenous vein  . FEMORAL-POPLITEAL BYPASS GRAFT Right 12/15/2019   Procedure: BYPASS GRAFT FEMORAL-POPLITEAL ARTERY;  Surgeon: [name], MD;  Location: [place];  Service: Vascular;  Laterality: Right;  . FEMORAL-POPLITEAL BYPASS GRAFT Left 06/20/2020   Procedure: BYPASS GRAFT FEMORAL TO ABOVE KNEE POPLITEAL ARTERY WITH HARVENSTED NON REVERSED GREATER SAPHENOUS VEIN;  Surgeon: [name], MD;  Location: [place];  Service: Vascular;  Laterality: Left;  . FEMORAL-POPLITEAL BYPASS GRAFT Left 02/24/2021   Procedure: REDO LEFT COMMON FEMORAL ARTERY EXPOSURE AND LEFT COMMON FEMORAL ARTERY TO BELOW KNEE POPLITEAL ARTERY BYPASS GRAFT WITH PROPATEN VASCULAR GRAFT;  Surgeon: [name], MD;  Location: [place];   Service: Vascular;  Laterality: Left;  . HYSTEROTOMY    . PERIPHERAL VASCULAR INTERVENTION Right 12/06/2019   Procedure: PERIPHERAL VASCULAR INTERVENTION;  Surgeon: [name], MD;  Location: [place] [facility];  Service: Cardiovascular;  Laterality: Right;  EXT ILIAC  . PERIPHERAL VASCULAR INTERVENTION Left 02/18/2021   Procedure: PERIPHERAL VASCULAR INTERVENTION;  Surgeon: Serafina Mitchell, MD;  Location: [place] [facility];  Service: Cardiovascular;  Laterality: Left;  renal artery  . POLYPECTOMY  04/08/2020   Procedure: POLYPECTOMY;  Surgeon: Jackquline Denmark, MD;  Location: [facility] ENDOSCOPY;  Service: Endoscopy;;  . WOUND EXPLORATION Right 01/10/2020   Procedure: right lower leg Wound Exploration with wound vac application;  Surgeon: Serafina Mitchell, MD;  Location: [facility];  Service: Vascular;  Laterality: Right;    Prior to Admission medications   Medication Sig Start Date End Date Taking? Authorizing Provider  aspirin EC 81 MG tablet Take 1 tablet (81 mg total) by mouth daily. Patient not taking: No sig reported 12/17/19   Gabriel Earing, PA-C  cetirizine (ZYRTEC) 10 MG tablet Take 10 mg by mouth daily.    [provider]  clorazepate (TRANXENE-T) 7.5 MG tablet Take 1 tablet (7.5 mg total) by mouth 2 (two) times daily as needed for anxiety. 01/10/13   Maryanna Shape, NP  digoxin (LANOXIN) 0.125 MG  tablet Take 0.5 tablets (0.0625 mg total) by mouth every other day. 10/30/19   Troy Sine, MD  Dulaglutide 3 MG/0.5ML SOPN Inject 3 mg into the skin once a week.    [provider]  furosemide (LASIX) 20 MG tablet TAKE 2 TABLETS BY MOUTH IN THE MORNING AND AN EXTRA TABLET IF GAIN 3 OR MORE POUNDS IN 1 DAY Patient taking differently: Take 20-40 mg by mouth See admin instructions. Take 40 mg in the morning, may take an additional 20 mg for weight gain of 3lbs or more in one day 10/02/20   Troy Sine, MD  glipiZIDE (GLUCOTROL XL) 10 MG 24 hr tablet Take 10 mg by mouth 2  (two) times daily.  07/07/14   [provider]  levothyroxine (SYNTHROID) 125 MCG tablet Take 125 mcg by mouth daily before breakfast. 11/15/19   [provider]  metoprolol (TOPROL-XL) 200 MG 24 hr tablet TAKE 1 TABLET BY MOUTH EVERY DAY Patient taking differently: Take 200 mg by mouth daily. 09/23/20   Troy Sine, MD  nilotinib (TASIGNA) 150 MG capsule Take 2 capsules (300 mg total) by mouth every 12 (twelve) hours. Take on an empty stomach, 1 hour before or 2 hours after meals. 12/24/20   Wyatt Portela, MD  oxyCODONE (ROXICODONE) 5 MG immediate release tablet Take 1 tablet (5 mg total) by mouth every 8 (eight) hours as needed. Patient taking differently: Take 2.5-5 mg by mouth every 6 (six) hours as needed for moderate pain. 06/22/20 06/22/21  Setzer, Edman Circle, PA-C  oxyCODONE-acetaminophen (PERCOCET) 5-325 MG tablet Take 1 tablet by mouth every 6 (six) hours as needed. 02/26/21   Rhyne, Hulen Shouts, PA-C  rosuvastatin (CRESTOR) 5 MG tablet Take 2 tablets (10 mg total) by mouth daily. 02/26/21 02/26/22  Rhyne, Hulen Shouts, PA-C  sitaGLIPtin (JANUVIA) 50 MG tablet Take 50 mg by mouth daily.  07/13/16   [provider]  spironolactone (ALDACTONE) 25 MG tablet Take 0.5 tablets (12.5 mg total) by mouth 2 (two) times daily. Patient taking differently: Take 25 mg by mouth every evening. 01/24/18 11/27/20  Troy Sine, MD    Current Facility-Administered Medications  Medication Dose Route Frequency Provider Last Rate Last Admin  . 0.9 %  sodium chloride infusion  10 mL/hr Intravenous Once Smith, Rondell A, MD      . albuterol (PROVENTIL) (2.5 MG/3ML) 0.083% nebulizer solution 2.5 mg  2.5 mg Nebulization Q6H PRN Smith, Rondell A, MD      . pantoprazole (PROTONIX) 80 mg in sodium chloride 0.9 % 100 mL (0.8 mg/mL) infusion  8 mg/hr Intravenous Continuous Smith, Rondell A, MD      . pantoprazole (PROTONIX) 80 mg in sodium chloride 0.9 % 100 mL IVPB  80 mg Intravenous Once Smith,  Rondell A, MD      . sodium chloride flush (NS) 0.9 % injection 3 mL  3 mL Intravenous Q12H Norval Morton, MD       Current Outpatient Medications  Medication Sig Dispense Refill  . aspirin EC 81 MG tablet Take 1 tablet (81 mg total) by mouth daily. (Patient not taking: No sig reported)    . cetirizine (ZYRTEC) 10 MG tablet Take 10 mg by mouth daily.    . clorazepate (TRANXENE-T) 7.5 MG tablet Take 1 tablet (7.5 mg total) by mouth 2 (two) times daily as needed for anxiety. 30 tablet 1  . digoxin (LANOXIN) 0.125 MG tablet Take 0.5 tablets (0.0625 mg total) by mouth  every other day. 45 tablet 1  . Dulaglutide 3 MG/0.5ML SOPN Inject 3 mg into the skin once a week.    . furosemide (LASIX) 20 MG tablet TAKE 2 TABLETS BY MOUTH IN THE MORNING AND AN EXTRA TABLET IF GAIN 3 OR MORE POUNDS IN 1 DAY (Patient taking differently: Take 20-40 mg by mouth See admin instructions. Take 40 mg in the morning, may take an additional 20 mg for weight gain of 3lbs or more in one day) 270 tablet 1  . glipiZIDE (GLUCOTROL XL) 10 MG 24 hr tablet Take 10 mg by mouth 2 (two) times daily.     Marland Kitchen levothyroxine (SYNTHROID) 125 MCG tablet Take 125 mcg by mouth daily before breakfast.    . metoprolol (TOPROL-XL) 200 MG 24 hr tablet TAKE 1 TABLET BY MOUTH EVERY DAY (Patient taking differently: Take 200 mg by mouth daily.) 90 tablet 3  . nilotinib (TASIGNA) 150 MG capsule Take 2 capsules (300 mg total) by mouth every 12 (twelve) hours. Take on an empty stomach, 1 hour before or 2 hours after meals. 120 capsule 0  . oxyCODONE (ROXICODONE) 5 MG immediate release tablet Take 1 tablet (5 mg total) by mouth every 8 (eight) hours as needed. (Patient taking differently: Take 2.5-5 mg by mouth every 6 (six) hours as needed for moderate pain.) 20 tablet 0  . oxyCODONE-acetaminophen (PERCOCET) 5-325 MG tablet Take 1 tablet by mouth every 6 (six) hours as needed. 20 tablet 0  . rosuvastatin (CRESTOR) 5 MG tablet Take 2 tablets (10 mg total)  by mouth daily. 30 tablet 11  . sitaGLIPtin (JANUVIA) 50 MG tablet Take 50 mg by mouth daily.     Marland Kitchen spironolactone (ALDACTONE) 25 MG tablet Take 0.5 tablets (12.5 mg total) by mouth 2 (two) times daily. (Patient taking differently: Take 25 mg by mouth every evening.) 90 tablet 3    Allergies as of 03/10/2021 - Review Complete 03/10/2021  Allergen Reaction Noted  . Lisinopril Cough 02/11/2017  . Losartan Other (See Comments) 02/19/2017    Family History  Problem Relation Age of Onset  . Cancer - Colon Mother        mast to lungs  . Diabetes Father   . Diabetes Paternal Grandmother     Social History   Socioeconomic History  . Marital status: Single    Spouse name: Not on file  . Number of children: Not on file  . Years of education: Not on file  . Highest education level: Not on file  Occupational History  . Not on file  Tobacco Use  . Smoking status: Former Smoker    Types: Cigarettes    Quit date: 01/01/1984    Years since quitting: 37.2  . Smokeless tobacco: Never Used  Vaping Use  . Vaping Use: Never used  Substance and Sexual Activity  . Alcohol use: No  . Drug use: Never  . Sexual activity: Not on file  Other Topics Concern  . Not on file  Social History Narrative  . Not on file   Social Determinants of Health   Financial Resource Strain: Not on file  Food Insecurity: Not on file  Transportation Needs: Not on file  Physical Activity: Not on file  Stress: Not on file  Social Connections: Not on file  Intimate Partner Violence: Not on file    Review of Systems: Pertinent positive and negative review of systems were noted in the above HPI section.  All other review of systems was otherwise  negative.  Physical Exam: Vital signs in last 24 hours: Temp:  [97.6 F (36.4 C)-97.9 F (36.6 C)] 97.7 F (36.5 C) (04/11 1233) Pulse Rate:  [96-120] 120 (04/11 1223) Resp:  [16-28] 16 (04/11 1233) BP: (105-127)/(49-55) 127/55 (04/11 1233) SpO2:  [91 %-100 %] 94  % (04/11 1233) Weight:  [81.6 kg] 81.6 kg (04/11 1017)   General:   Alert,  Well-developed, pale elderly white female well-nourished, pleasant and cooperative in NAD.  Daughter at bedside Head:  Normocephalic and atraumatic. Eyes:  Sclera clear, no icterus.   Conjunctiva pale. Ears:  Normal auditory acuity. Nose:  No deformity, discharge,  or lesions. Mouth:  No deformity or lesions.   Neck:  Supple; no masses or thyromegaly. Lungs:  Clear throughout to auscultation.   No wheezes, crackles, or rhonchi.  Heart:  irRegular rate and rhythm; no murmurs, clicks, rubs,  or gallops. Abdomen:  Soft,nontender, BS active,nonpalp mass or hsm.   Rectal: Not done-heme positive dark stool in the ER [place] per notes Msk:  Symmetrical without gross deformities. . Pulses:  Normal pulses noted. Extremities:  Without clubbing or edema. Neurologic:  Alert and  oriented x4;  grossly normal neurologically. Skin:  Intact without significant lesions or rashes.. Psych:  Alert and cooperative. Normal mood and affect.  Intake/Output from previous day: No intake/output data recorded. Intake/Output this shift: Total I/O In: 2508.3 [I.V.:800; Blood:1000; IV Piggyback:708.3] Out: -   Lab Results: Recent Labs    03/10/21 1028  WBC 20.3*  HGB 5.7*  HCT 19.1*  PLT 523*   BMET Recent Labs    03/10/21 1028  NA 138  K 4.1  CL 112*  CO2 20*  GLUCOSE 170*  BUN 37*  CREATININE 1.14*  CALCIUM 8.3*   LFT Recent Labs    03/10/21 1028  PROT 5.3*  ALBUMIN 2.8*  AST 9*  ALT 11  ALKPHOS 42  BILITOT 0.8   PT/INR Recent Labs    03/10/21 1028  LABPROT 15.4*  INR 1.3*   Hepatitis Panel No results for input(s): HEPBSAG, HCVAB, HEPAIGM, HEPBIGM in the last 72 hours.    IMPRESSION:  #80 80 year old white female with acute probable upper GI bleed with 4 g drop in hemoglobin over the past 11 days. Patient had black stool at home last week but has been constipated and has not had a bowel  movement over the past couple of days. Hemoglobin 7.1 on 03/07/21 when in the ER /[place]-transfused 1 unit of packed RBCs Hemoglobin 9.0 on discharge from the hospital 02/25/2021  Etiology of bleeding is not clear, patient has only been on baby aspirin.  Rule out gastritis, peptic ulcer disease, AVMs  Doubt lower GI source by history, and fairly recent colonoscopy May 2021 with finding of a few diverticuli and 2 small polyps which were removed.  #2 CML 3.  Chronic kidney disease stage III 4.  Renal artery stenosis 5.  Nonischemic cardiomyopathy with EF around 40% #6 peripheral arterial disease, very recent admission with occlusion of left lower extremity bypass graft and underwent redo procedure on 02/24/2021  PLAN: Clear liquid diet, n.p.o. after midnight Transfuse 2 to 3 units of packed RBCs slowly IV PPI twice daily Serial hemoglobins Patient will be scheduled for EGD with Dr. Hilarie Fredrickson in a.m. tomorrow.  Procedure was discussed in detail with the patient and her daughter including indications risk and benefits and she is agreeable to proceed. If EGD is unrevealing consider capsule endoscopy. Thank you will follow with you  Allaina Brotzman PA-C 03/10/2021, 12:39 PM

## 2021-03-11 ENCOUNTER — Encounter (HOSPITAL_COMMUNITY): Payer: Self-pay | Admitting: Internal Medicine

## 2021-03-11 ENCOUNTER — Inpatient Hospital Stay (HOSPITAL_COMMUNITY): Payer: PPO | Admitting: Certified Registered Nurse Anesthetist

## 2021-03-11 ENCOUNTER — Encounter (HOSPITAL_COMMUNITY)
Admission: EM | Disposition: A | Discharge status: Home Health | Payer: Self-pay | Source: Home / Self Care | Attending: Internal Medicine

## 2021-03-11 ENCOUNTER — Encounter: Payer: PPO | Admitting: Vascular Surgery

## 2021-03-11 DIAGNOSIS — K222 Esophageal obstruction: Secondary | ICD-10-CM

## 2021-03-11 DIAGNOSIS — K31819 Angiodysplasia of stomach and duodenum without bleeding: Secondary | ICD-10-CM

## 2021-03-11 HISTORY — PX: HOT HEMOSTASIS: SHX5433

## 2021-03-11 HISTORY — PX: GIVENS CAPSULE STUDY: SHX5432

## 2021-03-11 HISTORY — PX: ESOPHAGOGASTRODUODENOSCOPY (EGD) WITH PROPOFOL: SHX5813

## 2021-03-11 LAB — TYPE AND SCREEN
ABO/RH(D): A POS
Antibody Screen: NEGATIVE
Unit division: 0
Unit division: 0

## 2021-03-11 LAB — BASIC METABOLIC PANEL
Anion gap: 6 (ref 5–15)
BUN: 26 mg/dL — ABNORMAL HIGH (ref 8–23)
CO2: 21 mmol/L — ABNORMAL LOW (ref 22–32)
Calcium: 8.2 mg/dL — ABNORMAL LOW (ref 8.9–10.3)
Chloride: 114 mmol/L — ABNORMAL HIGH (ref 98–111)
Creatinine, Ser: 1.03 mg/dL — ABNORMAL HIGH (ref 0.44–1.00)
GFR, Estimated: 55 mL/min — ABNORMAL LOW (ref >60)
Glucose, Bld: 96 mg/dL (ref 70–99)
Potassium: 3.8 mmol/L (ref 3.5–5.1)
Sodium: 141 mmol/L (ref 135–145)

## 2021-03-11 LAB — BPAM RBC
Blood Product Expiration Date: 202205012359
Blood Product Expiration Date: 202205012359
ISSUE DATE / TIME: 202204111216
ISSUE DATE / TIME: 202204111546
Unit Type and Rh: 6200
Unit Type and Rh: 6200

## 2021-03-11 LAB — CBC
HCT: 24.7 % — ABNORMAL LOW (ref 36.0–46.0)
Hemoglobin: 7.8 g/dL — ABNORMAL LOW (ref 12.0–15.0)
MCH: 29.3 pg (ref 26.0–34.0)
MCHC: 31.6 g/dL (ref 30.0–36.0)
MCV: 92.9 fL (ref 80.0–100.0)
Platelets: 428 10*3/uL — ABNORMAL HIGH (ref 150–400)
RBC: 2.66 MIL/uL — ABNORMAL LOW (ref 3.87–5.11)
RDW: 17.6 % — ABNORMAL HIGH (ref 11.5–15.5)
WBC: 16.1 10*3/uL — ABNORMAL HIGH (ref 4.0–10.5)
nRBC: 1.1 % — ABNORMAL HIGH (ref 0.0–0.2)

## 2021-03-11 LAB — GLUCOSE, CAPILLARY
Glucose-Capillary: 129 mg/dL — ABNORMAL HIGH (ref 70–99)
Glucose-Capillary: 123 mg/dL — ABNORMAL HIGH (ref 70–99)
Glucose-Capillary: 171 mg/dL — ABNORMAL HIGH (ref 70–99)
Glucose-Capillary: 112 mg/dL — ABNORMAL HIGH (ref 70–99)

## 2021-03-11 LAB — HEMOGLOBIN AND HEMATOCRIT, BLOOD
HCT: 24.8 % — ABNORMAL LOW (ref 36.0–46.0)
Hemoglobin: 8 g/dL — ABNORMAL LOW (ref 12.0–15.0)

## 2021-03-11 SURGERY — ESOPHAGOGASTRODUODENOSCOPY (EGD) WITH PROPOFOL
Anesthesia: Monitor Anesthesia Care

## 2021-03-11 MED ORDER — LACTATED RINGERS IV SOLN: INTRAVENOUS | Status: DC | PRN

## 2021-03-11 MED ORDER — LACTATED RINGERS IV SOLN
INTRAVENOUS | Status: DC
  Administered 2021-03-13: 1000 mL via INTRAVENOUS

## 2021-03-11 MED ORDER — PROPOFOL 500 MG/50ML IV EMUL
INTRAVENOUS | Status: DC | PRN
  Administered 2021-03-11: 100 ug/kg/min via INTRAVENOUS

## 2021-03-11 MED ORDER — METOPROLOL SUCCINATE ER 100 MG PO TB24: 100.0000 mg | ORAL_TABLET | Freq: Every day | ORAL | Status: DC

## 2021-03-11 MED ORDER — LABETALOL HCL 5 MG/ML IV SOLN: 20.0000 mg | Freq: Once | INTRAVENOUS | Status: DC

## 2021-03-11 MED ORDER — LIDOCAINE 2% (20 MG/ML) 5 ML SYRINGE
INTRAMUSCULAR | Status: DC | PRN
  Administered 2021-03-11: 40 mg via INTRAVENOUS

## 2021-03-11 MED ORDER — PANTOPRAZOLE SODIUM 40 MG PO TBEC
40.0000 mg | DELAYED_RELEASE_TABLET | Freq: Every day | ORAL | Status: DC
  Administered 2021-03-11 – 2021-03-14 (×3): 40 mg via ORAL
  Filled 2021-03-11 (×3): qty 1

## 2021-03-11 MED ORDER — ESMOLOL HCL 100 MG/10ML IV SOLN
INTRAVENOUS | Status: DC | PRN
  Administered 2021-03-11: 20 mg via INTRAVENOUS

## 2021-03-11 MED ORDER — PROPOFOL 10 MG/ML IV BOLUS
INTRAVENOUS | Status: DC | PRN
  Administered 2021-03-11 (×2): 10 mg via INTRAVENOUS

## 2021-03-11 MED ORDER — METOPROLOL TARTRATE 5 MG/5ML IV SOLN: 5.0000 mg | Freq: Four times a day (QID) | INTRAVENOUS | Status: DC | PRN

## 2021-03-11 MED ORDER — METOPROLOL SUCCINATE ER 50 MG PO TB24
50.0000 mg | ORAL_TABLET | Freq: Every day | ORAL | Status: DC
  Administered 2021-03-11 – 2021-03-14 (×4): 50 mg via ORAL
  Filled 2021-03-11 (×4): qty 1

## 2021-03-11 SURGICAL SUPPLY — 15 items

## 2021-03-11 NOTE — Op Note (Signed)
[facility] Patient Name: Sara Warner Procedure Date : 03/11/2021 MRN: [id number] Attending MD: Jerene Bears , MD Date of Birth: 1941/11/08 CSN: [id number] Age: 80 Admit Type: Inpatient Procedure:                Upper GI endoscopy Indications:              Acute post hemorrhagic anemia, Melena Providers:                Lajuan Lines. Hilarie Fredrickson, MD, Mariana Arn, Carmie End,                            RN, Tyna Jaksch Technician Referring MD:             [facility] Medicines:                Monitored Anesthesia Care Complications:            No immediate complications. Estimated Blood Loss:     Estimated blood loss: none. Procedure:                Pre-Anesthesia Assessment:                           - Prior to the procedure, a History and Physical                            was performed, and patient medications and                            allergies were reviewed. The patient's tolerance of                            previous anesthesia was also reviewed. The risks                            and benefits of the procedure and the sedation                            options and risks were discussed with the patient.                            All questions were answered, and informed consent                            was obtained. Prior Anticoagulants: The patient has                            taken no previous anticoagulant or antiplatelet                            agents. ASA Grade Assessment: III - A patient with                            severe systemic disease. After reviewing the risks  and benefits, the patient was deemed in                            satisfactory condition to undergo the procedure.                           After obtaining informed consent, the endoscope was                            passed under direct vision. Throughout the                            procedure, the patient's blood pressure, pulse,  and                            oxygen saturations were monitored continuously. The                            GIF-H190 ([id number]) Olympus gastroscope was                            introduced through the mouth, and advanced to the                            third part of duodenum. The upper GI endoscopy was                            accomplished without difficulty. The patient                            tolerated the procedure well. Scope In: Scope Out: Findings:      Normal mucosa was found in the entire esophagus.      A non-obstructing and partial Schatzki ring was found at the       gastroesophageal junction at 41 cm from incisors.      A 2 cm hiatal hernia was present.      The entire examined stomach was normal.      A single 3 mm angioectasia with typical arborization was found in the       second portion of the duodenum. Fulguration to ablate the lesion to       prevent bleeding by argon plasma at 0.5 liters/minute and 20 watts was       successful.      The exam of the duodenum was otherwise normal. Impression:               - Normal mucosa was found in the entire esophagus.                           - Non-obstructing Schatzki ring.                           - 2 cm hiatal hernia.                           - Normal stomach.                           -  A single angioectasia in the duodenum. Treated                            with argon plasma coagulation (APC).                           - No specimens collected. Moderate Sedation:      N/A Recommendation:           - Return patient to hospital ward for ongoing care.                           - Advance diet as tolerated. If she elects to                            proceed with VCE, then diet per video capsule                            protocol.                           - Continue present medications.                           - Video capsule endoscopy to be considered. Very                            likely she has  additional small bowel angioectasias                            which are the causing of anemia and melena. Would                            recommend following ferritin/IBC panel along with                            Hgb. IV iron is recommended based on iron studies. Procedure Code(s):        --- Professional ---                           [phone], Esophagogastroduodenoscopy, flexible,                            transoral; with control of bleeding, any method Diagnosis Code(s):        --- Professional ---                           K22.2, Esophageal obstruction                           K44.9, Diaphragmatic hernia without obstruction or                            gangrene  K31.819, Angiodysplasia of stomach and duodenum                            without bleeding                           D62, Acute posthemorrhagic anemia                           K92.1, Melena (includes Hematochezia) CPT copyright 2019 American Medical Association. All rights reserved. The codes documented in this report are preliminary and upon coder review may  be revised to meet current compliance requirements. Jerene Bears, MD 03/11/2021 10:12:08 AM This report has been signed electronically. Number of Addenda: 0

## 2021-03-11 NOTE — Transfer of Care (Signed)
Immediate Anesthesia Transfer of Care Note  Patient: Jakia Kennebrew  Procedure(s) Performed: ESOPHAGOGASTRODUODENOSCOPY (EGD) WITH PROPOFOL (N/A ) HOT HEMOSTASIS (ARGON PLASMA COAGULATION/BICAP) (N/A )  Patient Location: Endoscopy Unit  Anesthesia Type:MAC  Level of Consciousness: drowsy and patient cooperative  Airway & Oxygen Therapy: Patient Spontanous Breathing and Patient connected to nasal cannula oxygen  Post-op Assessment: Report given to RN and Post -op Vital signs reviewed and stable  Post vital signs: Reviewed  Last Vitals:  Vitals Value Taken Time  BP 92/49 03/11/21 0958  Temp 36.2 C 03/11/21 0956  Pulse 104 03/11/21 0959  Resp 22 03/11/21 0959  SpO2 100 % 03/11/21 0959  Vitals shown include unvalidated device data.  Last Pain:  Vitals:   03/11/21 0956  TempSrc: Temporal  PainSc: 0-No pain         Complications: No complications documented.

## 2021-03-11 NOTE — Anesthesia Procedure Notes (Signed)
Procedure Name: MAC Date/Time: 03/11/2021 9:32 AM Performed by: Janene Harvey, CRNA Pre-anesthesia Checklist: Patient identified, Emergency Drugs available, Suction available and Patient being monitored Patient Re-evaluated:Patient Re-evaluated prior to induction Oxygen Delivery Method: Nasal cannula Induction Type: IV induction Placement Confirmation: positive ETCO2 Dental Injury: Teeth and Oropharynx as per pre-operative assessment

## 2021-03-11 NOTE — Anesthesia Preprocedure Evaluation (Addendum)
Anesthesia Evaluation  Patient identified by MRN, date of birth, ID band Patient awake    Reviewed: Allergy & Precautions, NPO status , Patient's Chart, lab work & pertinent test results, reviewed documented beta blocker date and time   Airway Mallampati: II  TM Distance: >3 FB Neck ROM: Full    Dental no notable dental hx. (+) Teeth Intact, Dental Advisory Given   Pulmonary neg pulmonary ROS, former smoker,    Pulmonary exam normal breath sounds clear to auscultation       Cardiovascular hypertension, Pt. on home beta blockers and Pt. on medications + Peripheral Vascular Disease (renal artery stenosis s/p stent 09/2020, fem pop bypass 01/2021) and +CHF  Normal cardiovascular exam+ dysrhythmias (digoxin, no anticoagulation) Atrial Fibrillation  Rhythm:Irregular Rate:Normal  TTE 2019 - Severe septal hyokinesis with overall mild LV dysfunction;  moderate LVE; mild MR; moderate biatrial enlargement; mild TR. EF 45-50%   Neuro/Psych negative neurological ROS  negative psych ROS   GI/Hepatic Neg liver ROS, GERD  Controlled and Medicated,  Endo/Other  diabetes, Type 2, Oral Hypoglycemic AgentsHypothyroidism   Renal/GU Renal InsufficiencyRenal disease (Cr 1.03, K 3.8)  negative genitourinary   Musculoskeletal negative musculoskeletal ROS (+)   Abdominal   Peds  Hematology  (+) Blood dyscrasia (Hgb 7.8), anemia ,   Anesthesia Other Findings EGD for anemia, melena  Reproductive/Obstetrics                            Anesthesia Physical Anesthesia Plan  ASA: III  Anesthesia Plan: MAC   Post-op Pain Management:    Induction: Intravenous  PONV Risk Score and Plan: 2 and Propofol infusion and Treatment may vary due to age or medical condition  Airway Management Planned: Natural Airway  Additional Equipment:   Intra-op Plan:   Post-operative Plan:   Informed Consent: I have reviewed the  patients History and Physical, chart, labs and discussed the procedure including the risks, benefits and alternatives for the proposed anesthesia with the patient or authorized representative who has indicated his/her understanding and acceptance.     Dental advisory given  Plan Discussed with: CRNA  Anesthesia Plan Comments:         Anesthesia Quick Evaluation

## 2021-03-11 NOTE — Interval H&P Note (Signed)
History and Physical Interval Note: For EGD today to eval upper gi bleeding. S/p prbcs. The nature of the procedure, as well as the risks, benefits, and alternatives were carefully and thoroughly reviewed with the patient. Ample time for discussion and questions allowed. The patient understood, was satisfied, and agreed to proceed.   CBC Latest Ref Rng & Units 03/11/2021 03/10/2021 03/10/2021  WBC 4.0 - 10.5 K/uL 16.1(H) - 20.3(H)  Hemoglobin 12.0 - 15.0 g/dL 7.8(L) 8.2(L) 5.7(LL)  Hematocrit 36.0 - 46.0 % 24.7(L) 25.9(L) 19.1(L)  Platelets 150 - 400 K/uL 428(H) - 523(H)     03/11/2021 9:24 AM  Sara Warner  has presented today for surgery, with the diagnosis of Anemia, melena.  The various methods of treatment have been discussed with the patient and family. After consideration of risks, benefits and other options for treatment, the patient has consented to  Procedure(s): ESOPHAGOGASTRODUODENOSCOPY (EGD) WITH PROPOFOL (N/A) as a surgical intervention.  The patient's history has been reviewed, patient examined, no change in status, stable for surgery.  I have reviewed the patient's chart and labs.  Questions were answered to the patient's satisfaction.     Lajuan Lines Stepheny Canal

## 2021-03-11 NOTE — Progress Notes (Addendum)
PROGRESS NOTE    Sara Warner  VHQ:[id number] DOB: 1941/07/12 DOA: 03/10/2021 PCP: Cyndi Bender, PA-C   Brief Narrative:  This 80 years old female with PMH significant for CML, systolic CHF, chronic atrial fibrillation, DM 2, PAD, renal artery stenosis s/p stent in November 2021 and hypothyroidism presents in the emergency department with complaints of generalized weakness and constipation.She just underwent redo of the left common femoral endarterectomy with profundoplasty and bovine pericardial patch angioplasty with left common femoral to below-knee popliteal artery bypass on 3/28 with Dr. Carlis Abbott. Following the procedure patient reports that she has been on low-dose aspirin and atorvastatin.  She has been progressively getting weaker.  Patient reports dark tarry stools 5 days ago, has not had any significant bowel movement thereafter.  Patient presented in the [facility] ED 4 days ago where her Hb was found to be 7.3.  Patient was given 1 unit of PRBC and was ultimately discharged home. Patient is not on any anticoagulation given prior history of GI bleed.  Last EGD 07/2020 revealed small hiatal hernia,, mild gastroduodenitis 2 sessile polyp removed, mild sigmoid diverticulosis and nonbleeding external and internal hemorrhoids. In the ED: Patient was found to be in atrial fibrillation with heart rate in 120s,  blood pressure 105/49.  X-ray abdomen revealed diffuse stool throughout the colon without signs of acute obstruction.  Hb 5.7 ,  Patient received 2 units of packed red blood cells.  GI was consulted.  Patient underwent EGD: Normal mucosa was found in the entire esophagus. Non-obstructing Schatzki ring. A single angioectasia in the duodenum. Treated with argon plasma coagulation (APC).  May need video capsule endoscopy.  Assessment & Plan:   Principal Problem:   Upper GI bleed Active Problems:   Atrial fibrillation (HCC)   Hypothyroid   CML (chronic myelocytic leukemia) (HCC)    Controlled type 2 diabetes mellitus without complication (HCC)   Stage III chronic kidney disease (HCC)   PAD (peripheral artery disease) (HCC)   Acute blood loss anemia   Melena   Angiodysplasia of duodenum   Acute blood loss anemia sec. to upper GI bleed:  Patient presentsed with generalized weakness and constipation. Patient was seen 4 days ago at [facility] , received 1 PRBC,  hemoglobin 7.3. Hemoglobin down to 5.7 g/dL.    Received 2 PRBCs.   Post trans Hemoglobin 7.8. Patient takes aspirin at home.  Hold aspirin. GI consulted,  Patient underwent EGD EGD: Normal mucosa was found in the entire esophagus. Non-obstructing Schatzki ring. A single angioectasia in the duodenum. Treated with argon plasma coagulation (APC).   May need video capsule endoscopy.  Chronic atrial fibrillation:  Heart rate controlled after resumption of medications.. At home patient takes Toprol XL 200 mg daily. Continue to digoxin, Toprol XL 100 mg daily. Not on anticoagulation due to recent GI bleed.   Leukocytosis:  WBC elevated at 20.3 > 16.1.  Patient denies any fever. -Continue to monitor  Essential hypertension:  Continue home blood pressure medications.   HFpEF:  Patient appears to be euvolemic at this time and no signs of fluid overload.   Last EF noted to be 45 to 50% in 01/2018. -Strict I&Os and daily weights  Diabetes mellitus type 2:  -Hypoglycemic protocol. -Hold Januvia and glipizide -CBGs before every meal with sensitive SSI  Peripheral artery disease:  Patient status post redo of the left common femoral endarterectomy with profundoplasty and bovine pericardial patch angioplasty with left common femoral to below-knee popliteal artery bypass on 3/28  with Dr. Carlis Abbott. -Held aspirin due to acute bleed -Continue statin  CML:  Patient followed in outpatient setting by Dr. Alen Blew -Continue Nilotinib  Anxiety -Continue clorazepate as needed  Chronic kidney disease  stage IIIa:  Renal function stable. -Continue to monitor  Hypothyroidism:  -Continue Synthroid  GERD:  Protonix 40 mg daily and Carafate 1 g 4 times daily   DVT prophylaxis: SCDs Code Status: Full code Family Communication: Spoke with daughter at bedside Disposition Plan:   Status is: Inpatient  Remains inpatient appropriate because:Inpatient level of care appropriate due to severity of illness   Dispo: The patient is from: Home              Anticipated d/c is to: Home              Patient currently is not medically stable to d/c.   Difficult to place patient No  Consultants:   Gastroenterology  Procedures: EGD Antimicrobials: Anti-infectives (From admission, onward)   None      Subjective: Patient was seen and examined at bedside after endoscopy.  Patient reports feeling better.   Overnight events noted.  Patient underwent EGD, found to have a single angiectasia which was treated.  Objective: Vitals:   03/11/21 1010 03/11/21 1017 03/11/21 1030 03/11/21 1126  BP: (!) 106/54 (!) 145/53 (!) 126/53 (!) 128/56  Pulse: (!) 114 (!) 118 (!) 113 100  Resp: 17 17 19 18   Temp:    97.8 F (36.6 C)  TempSrc:    Oral  SpO2: 98% 98% 100% 98%  Weight:      Height:        Intake/Output Summary (Last 24 hours) at 03/11/2021 1515 Last data filed at 03/11/2021 1500 Gross per 24 hour  Intake 1470.01 ml  Output --  Net 1470.01 ml   Filed Weights   03/10/21 1017 03/11/21 0417  Weight: 81.6 kg 77.6 kg    Examination:  General exam: Appears calm and comfortable, not in any acute distress. Respiratory system: Clear to auscultation. Respiratory effort normal. Cardiovascular system: S1 & S2 heard, RRR. No JVD, murmurs, rubs, gallops or clicks. No pedal edema. Gastrointestinal system: Abdomen is nondistended, soft and nontender. No organomegaly or masses felt.  Normal bowel sounds heard. Central nervous system: Alert and oriented. No focal neurological  deficits. Extremities: Symmetric 5 x 5 power.  No edema, no cyanosis, no clubbing. Skin: No rashes, lesions or ulcers Psychiatry: Judgement and insight appear normal. Mood & affect appropriate.     Data Reviewed: I have personally reviewed following labs and imaging studies  CBC: Recent Labs  Lab 03/10/21 1028 03/10/21 2047 03/11/21 0252  WBC 20.3*  --  16.1*  NEUTROABS 18.3*  --   --   HGB 5.7* 8.2* 7.8*  HCT 19.1* 25.9* 24.7*  MCV 96.0  --  92.9  PLT 523*  --  932*   Basic Metabolic Panel: Recent Labs  Lab 03/10/21 1028 03/11/21 0252  NA 138 141  K 4.1 3.8  CL 112* 114*  CO2 20* 21*  GLUCOSE 170* 96  BUN 37* 26*  CREATININE 1.14* 1.03*  CALCIUM 8.3* 8.2*   GFR: Estimated Creatinine Clearance: 48.5 mL/min (A) (by C-G formula based on SCr of 1.03 mg/dL (H)). Liver Function Tests: Recent Labs  Lab 03/10/21 1028  AST 9*  ALT 11  ALKPHOS 42  BILITOT 0.8  PROT 5.3*  ALBUMIN 2.8*   No results for input(s): LIPASE, AMYLASE in the last 168 hours. No results for  input(s): AMMONIA in the last 168 hours. Coagulation Profile: Recent Labs  Lab 03/10/21 1028  INR 1.3*   Cardiac Enzymes: No results for input(s): CKTOTAL, CKMB, CKMBINDEX, TROPONINI in the last 168 hours. BNP (last 3 results) No results for input(s): PROBNP in the last 8760 hours. HbA1C: No results for input(s): HGBA1C in the last 72 hours. CBG: Recent Labs  Lab 03/10/21 1600 03/10/21 2112 03/11/21 0617 03/11/21 1206  GLUCAP 142* 182* 129* 123*   Lipid Profile: No results for input(s): CHOL, HDL, LDLCALC, TRIG, CHOLHDL, LDLDIRECT in the last 72 hours. Thyroid Function Tests: No results for input(s): TSH, T4TOTAL, FREET4, T3FREE, THYROIDAB in the last 72 hours. Anemia Panel: No results for input(s): VITAMINB12, FOLATE, FERRITIN, TIBC, IRON, RETICCTPCT in the last 72 hours. Sepsis Labs: No results for input(s): PROCALCITON, LATICACIDVEN in the last 168 hours.  Recent Results (from the  past 240 hour(s))  Resp Panel by RT-PCR (Flu A&B, Covid) Nasopharyngeal Swab     Status: None   Collection Time: 03/10/21 10:39 AM   Specimen: Nasopharyngeal Swab; Nasopharyngeal(NP) swabs in vial transport medium  Result Value Ref Range Status   SARS Coronavirus 2 by RT PCR NEGATIVE NEGATIVE Final    Comment: (NOTE) SARS-CoV-2 target nucleic acids are NOT DETECTED.  The SARS-CoV-2 RNA is generally detectable in upper respiratory specimens during the acute phase of infection. The lowest concentration of SARS-CoV-2 viral copies this assay can detect is 138 copies/mL. A negative result does not preclude SARS-Cov-2 infection and should not be used as the sole basis for treatment or other patient management decisions. A negative result may occur with  improper specimen collection/handling, submission of specimen other than nasopharyngeal swab, presence of viral mutation(s) within the areas targeted by this assay, and inadequate number of viral copies(<138 copies/mL). A negative result must be combined with clinical observations, patient history, and epidemiological information. The expected result is Negative.  Fact Sheet for Patients:  EntrepreneurPulse.com.au  Fact Sheet for Healthcare Providers:  IncredibleEmployment.be  This test is no t yet approved or cleared by the [place] FDA and  has been authorized for detection and/or diagnosis of SARS-CoV-2 by FDA under an Emergency Use Authorization (EUA). This EUA will remain  in effect (meaning this test can be used) for the duration of the COVID-19 declaration under Section 564(b)(1) of the Act, 21 U.S.C.section 360bbb-3(b)(1), unless the authorization is terminated  or revoked sooner.       Influenza A by PCR NEGATIVE NEGATIVE Final   Influenza B by PCR NEGATIVE NEGATIVE Final    Comment: (NOTE) The Xpert Xpress SARS-CoV-2/FLU/RSV plus assay is intended as an aid in the diagnosis of  influenza from Nasopharyngeal swab specimens and should not be used as a sole basis for treatment. Nasal washings and aspirates are unacceptable for Xpert Xpress SARS-CoV-2/FLU/RSV testing.  Fact Sheet for Patients: EntrepreneurPulse.com.au  Fact Sheet for Healthcare Providers: IncredibleEmployment.be  This test is not yet approved or cleared by the [place] FDA and has been authorized for detection and/or diagnosis of SARS-CoV-2 by FDA under an Emergency Use Authorization (EUA). This EUA will remain in effect (meaning this test can be used) for the duration of the COVID-19 declaration under Section 564(b)(1) of the Act, 21 U.S.C. section 360bbb-3(b)(1), unless the authorization is terminated or revoked.  Performed at [facility], [place] [address], [place], [place] [address]     Radiology Studies: DG Abdomen Acute W/Chest  Result Date: 03/10/2021 CLINICAL DATA:  Abdominal pain and fatigue EXAM:  DG ABDOMEN ACUTE WITH 1 VIEW CHEST COMPARISON:  CT abdomen and pelvis March 04, 2016 FINDINGS: PA chest: The lungs are clear. There is cardiomegaly with pulmonary vascularity normal. No adenopathy. There is aortic atherosclerosis. Supine and upright abdomen: There is fairly diffuse stool throughout the colon. There is no bowel dilatation or air-fluid level to suggest bowel obstruction. No free air. Right iliac artery region stent noted. Probable phleboliths in the pelvis. Surgical clips overlie the left acetabulum. IMPRESSION: Fairly diffuse stool throughout colon. No bowel obstruction or free air evident. Lungs clear. Cardiomegaly. Aortic Atherosclerosis (ICD10-I70.0). Electronically Signed   By: Lowella Grip III M.D.   On: 03/10/2021 11:35   Scheduled Meds: . digoxin  0.0625 mg Oral QODAY  . insulin aspart  0-9 Units Subcutaneous TID WC  . labetalol  20 mg Intravenous Once  . levothyroxine  125 mcg Oral QAC breakfast  . loratadine  10 mg Oral  Daily  . metoprolol succinate  50 mg Oral Daily  . pantoprazole  40 mg Oral Q0600  . rosuvastatin  10 mg Oral Daily  . sodium chloride flush  3 mL Intravenous Q12H   Continuous Infusions: . lactated ringers Stopped (03/11/21 1238)     LOS: 1 day    Time spent: 35 mins    Whitfield Dulay, MD [facility]   If 7PM-7AM, please contact night-coverage

## 2021-03-11 NOTE — Consult Note (Signed)
[facility] Nurse Consult Note: Patient receiving care in [place]. Reason for Consult: wound to left heel and ankle Wound type: Patient is diabetic and recently underwent revascularization to the LLE. These are combo former arterial insufficiency, and diabetic foot ulcers Pressure Injury POA: Yes/No/NA Measurement: Left posterior heel wound has opened and how has a fissure that measures 3 cm x 4 cm. Surrounding tissue is very dry and flaking. Patient states she has been soaking her foot. I explained that she should not be soaking her feet, she has peripheral neuropathy, and soaking the left foot can contribute to microbial invasion via the fissure.  I also explained that she should be seeing a podiatrist routinely.  She tells me that she was seeing Dr. Cannon Kettle and will call and ask for an appointment. Wound bed: pink Drainage (amount, consistency, odor) none Periwound: as above Dressing procedure/placement/frequency:  Cut an Aquacel dressing Kellie Simmering [phone]) in half. Place half of the dressing over the fissure of the left posterior heel.  Secure in place with a few turns of kerlex. Change daily. For the scab on the medial malleolus, twice daily application of iodine. The patient floats her heels off of the mattress using pillows. I explained she should continue doing this. Thank you for the consult.  Discussed plan of care with the patient and bedside nurse.  [facility] nurse will not follow at this time.  Please re-consult the [facility] team if needed.  Val Riles, RN, MSN, CWOCN, CNS-BC, pager [phone]

## 2021-03-12 ENCOUNTER — Encounter (HOSPITAL_COMMUNITY): Payer: Self-pay | Admitting: Internal Medicine

## 2021-03-12 DIAGNOSIS — K921 Melena: Secondary | ICD-10-CM

## 2021-03-12 LAB — COMPREHENSIVE METABOLIC PANEL
ALT: 10 U/L (ref 0–44)
AST: 12 U/L — ABNORMAL LOW (ref 15–41)
Albumin: 2.9 g/dL — ABNORMAL LOW (ref 3.5–5.0)
Alkaline Phosphatase: 43 U/L (ref 38–126)
Anion gap: 7 (ref 5–15)
BUN: 17 mg/dL (ref 8–23)
CO2: 21 mmol/L — ABNORMAL LOW (ref 22–32)
Calcium: 8.4 mg/dL — ABNORMAL LOW (ref 8.9–10.3)
Chloride: 110 mmol/L (ref 98–111)
Creatinine, Ser: 1.32 mg/dL — ABNORMAL HIGH (ref 0.44–1.00)
GFR, Estimated: 41 mL/min — ABNORMAL LOW (ref >60)
Glucose, Bld: 141 mg/dL — ABNORMAL HIGH (ref 70–99)
Potassium: 4.1 mmol/L (ref 3.5–5.1)
Sodium: 138 mmol/L (ref 135–145)
Total Bilirubin: 0.9 mg/dL (ref 0.3–1.2)
Total Protein: 4.9 g/dL — ABNORMAL LOW (ref 6.5–8.1)

## 2021-03-12 LAB — CBC
HCT: 23.7 % — ABNORMAL LOW (ref 36.0–46.0)
Hemoglobin: 7.7 g/dL — ABNORMAL LOW (ref 12.0–15.0)
MCH: 29.7 pg (ref 26.0–34.0)
MCHC: 32.5 g/dL (ref 30.0–36.0)
MCV: 91.5 fL (ref 80.0–100.0)
Platelets: 452 10*3/uL — ABNORMAL HIGH (ref 150–400)
RBC: 2.59 MIL/uL — ABNORMAL LOW (ref 3.87–5.11)
RDW: 18.1 % — ABNORMAL HIGH (ref 11.5–15.5)
WBC: 16 10*3/uL — ABNORMAL HIGH (ref 4.0–10.5)
nRBC: 0.4 % — ABNORMAL HIGH (ref 0.0–0.2)

## 2021-03-12 LAB — GLUCOSE, CAPILLARY
Glucose-Capillary: 136 mg/dL — ABNORMAL HIGH (ref 70–99)
Glucose-Capillary: 143 mg/dL — ABNORMAL HIGH (ref 70–99)
Glucose-Capillary: 139 mg/dL — ABNORMAL HIGH (ref 70–99)
Glucose-Capillary: 125 mg/dL — ABNORMAL HIGH (ref 70–99)

## 2021-03-12 LAB — HEMOGLOBIN AND HEMATOCRIT, BLOOD
HCT: 24.7 % — ABNORMAL LOW (ref 36.0–46.0)
Hemoglobin: 7.8 g/dL — ABNORMAL LOW (ref 12.0–15.0)

## 2021-03-12 LAB — PHOSPHORUS: Phosphorus: 2.9 mg/dL (ref 2.5–4.6)

## 2021-03-12 LAB — MAGNESIUM: Magnesium: 2.1 mg/dL (ref 1.7–2.4)

## 2021-03-12 MED ORDER — PEG-KCL-NACL-NASULF-NA ASC-C 100 G PO SOLR
0.5000 | Freq: Once | ORAL | Status: AC
  Administered 2021-03-13: 100 g via ORAL
  Filled 2021-03-12: qty 1

## 2021-03-12 NOTE — Progress Notes (Addendum)
Patient ID: Sara Warner, female   DOB: 29-Jul-1941, 80 y.o.   MRN: [id number]    Progress Note   Subjective   Day # 3  CC; anemia, weakness, tarry stool  HGb 8.0 yesterday, 7.7 today   Capsule endoscopy - Capsule retained in the stomach for the entire study, scant amount of heme in the stomach probably from scope trauma for EGD same day .  EGD yesterday 0. 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 She is hoping to go home.  No complaints of nausea, no abdominal pain Has not passed the capsule as yet   Objective   Vital signs in last 24 hours: Temp:  [97.6 F (36.4 C)-98.3 F (36.8 C)] 98 F (36.7 C) (04/13 1057) Pulse Rate:  [93-110] 99 (04/13 1057) Resp:  [16-20] 18 (04/13 1057) BP: (108-134)/(54-68) 117/54 (04/13 1057) SpO2:  [95 %-100 %] 100 % (04/13 1057) Weight:  [78.8 kg] 78.8 kg (04/13 0345) Last BM Date: 03/11/21 General:   Elderly white female in NAD Heart:  Regular rate and rhythm; no murmurs Lungs: Respirations even and unlabored, lungs CTA bilaterally Abdomen:  Soft, nontender and nondistended. Normal bowel sounds. Extremities:  Without edema. Neurologic:  Alert and oriented,  grossly normal neurologically. Psych:  Cooperative. Normal mood and affect.  Intake/Output from previous day: 04/12 0701 - 04/13 0700 In: 611.2 [P.O.:150; I.V.:461.2] Out: 250 [Urine:250] Intake/Output this shift: Total I/O In: -  Out: 100 [Urine:100]  Lab Results: Recent Labs    03/10/21 1028 03/10/21 2047 03/11/21 0252 03/11/21 2033 03/12/21 0411  WBC 20.3*  --  16.1*  --  16.0*  HGB 5.7*   < > 7.8* 8.0* 7.7*  HCT 19.1*   < > 24.7* 24.8* 23.7*  PLT 523*  --  428*  --  452*   < > = values in this interval not displayed.   BMET Recent Labs    03/10/21 1028 03/11/21 0252 03/12/21 0411  NA 138 141 138  K 4.1 3.8 4.1  CL 112* 114* 110  CO2 20* 21* 21*  GLUCOSE  170* 96 141*  BUN 37* 26* 17  CREATININE 1.14* 1.03* 1.32*  CALCIUM 8.3* 8.2* 8.4*   LFT Recent Labs    03/12/21 0411  PROT 4.9*  ALBUMIN 2.9*  AST 12*  ALT 10  ALKPHOS 43  BILITOT 0.9   PT/INR Recent Labs    03/10/21 1028  LABPROT 15.4*  INR 1.3*        Assessment / Plan:    #36 80 year old white female admitted with severe anemia, heme positive stool and complaint of very dark stool at home.  On aspirin.  Hemoglobin 5 7 on admission, she has been transfused 2 units of packed RBCs and hemoglobin has been stable since, 7.7 this a.m.  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  There is no active bleeding at the time of the procedure  Capsule endoscopy interpreted today, unfortunately the capsule was retained in the stomach for the entire study.  She had a scant amount of heme in the stomach felt secondary to scope trauma  I discussed capsule findings with the patient today, Discussed potential repeat EGD enteroscopy and then placement of the capsule into the duodenum for further evaluation.  She is not inclined to have any further  procedures and is wanting to go home and have her blood counts followed as an outpatient..  Advised that we at least need to make sure that her hemoglobin is stable prior to discharge.  We will repeat hemoglobin now-if she has had any further drift in hemoglobin would not discharge and will rediscuss enteroscopy and deployment of capsule for repeat capsule endoscopy. If hemoglobin is stable and patient wants to be discharged to home she should have her hemoglobin checked by her PCP later this week and will need serial hemoglobins over the next couple of weeks to assure stability.  Addendum-hemoglobin stable this afternoon at 7.8. I discussed the patient's situation with her daughter by phone.  Family prefers that patient remain in the hospital and have what ever  further procedures are indicated to get a definitive diagnosis.  Daughter had spoken with her mom who is now agreeable to stay in the hospital..  She will be scheduled for small bowel enteroscopy and placement of capsule into the small bowel tomorrow for 14 2022 with Dr. Hilarie Fredrickson.  Procedure was discussed in detail with the patient's daughter and had previously discussed with patient earlier today.  Continue serial hemoglobins Clear liquid diet this evening n.p.o. after midnight except for prep.  Will take a half bowel prep early a.m. prior to placement of capsule.   Principal Problem:   Upper GI bleed Active Problems:   Atrial fibrillation (HCC)   Hypothyroid   CML (chronic myelocytic leukemia) (HCC)   Controlled type 2 diabetes mellitus without complication (HCC)   Stage III chronic kidney disease (HCC)   PAD (peripheral artery disease) ([place])   Acute blood loss anemia   Melena   Angiodysplasia of duodenum     LOS: 2 days   Sara Nault EsterwoodPA-C  03/12/2021, 12:39 PM

## 2021-03-12 NOTE — H&P (View-Only) (Signed)
Patient ID: Sara Warner, female   DOB: 10/24/1941, 80 y.o.   MRN: [id number]    Progress Note   Subjective   Day # 3  CC; anemia, weakness, tarry stool  HGb 8.0 yesterday, 7.7 today   Capsule endoscopy - Capsule retained in the stomach for the entire study, scant amount of heme in the stomach probably from scope trauma for EGD same day .  EGD yesterday 0. 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 She is hoping to go home.  No complaints of nausea, no abdominal pain Has not passed the capsule as yet   Objective   Vital signs in last 24 hours: Temp:  [97.6 F (36.4 C)-98.3 F (36.8 C)] 98 F (36.7 C) (04/13 1057) Pulse Rate:  [93-110] 99 (04/13 1057) Resp:  [16-20] 18 (04/13 1057) BP: (108-134)/(54-68) 117/54 (04/13 1057) SpO2:  [95 %-100 %] 100 % (04/13 1057) Weight:  [78.8 kg] 78.8 kg (04/13 0345) Last BM Date: 03/11/21 General:   Elderly white female in NAD Heart:  Regular rate and rhythm; no murmurs Lungs: Respirations even and unlabored, lungs CTA bilaterally Abdomen:  Soft, nontender and nondistended. Normal bowel sounds. Extremities:  Without edema. Neurologic:  Alert and oriented,  grossly normal neurologically. Psych:  Cooperative. Normal mood and affect.  Intake/Output from previous day: 04/12 0701 - 04/13 0700 In: 611.2 [P.O.:150; I.V.:461.2] Out: 250 [Urine:250] Intake/Output this shift: Total I/O In: -  Out: 100 [Urine:100]  Lab Results: Recent Labs    03/10/21 1028 03/10/21 2047 03/11/21 0252 03/11/21 2033 03/12/21 0411  WBC 20.3*  --  16.1*  --  16.0*  HGB 5.7*   < > 7.8* 8.0* 7.7*  HCT 19.1*   < > 24.7* 24.8* 23.7*  PLT 523*  --  428*  --  452*   < > = values in this interval not displayed.   BMET Recent Labs    03/10/21 1028 03/11/21 0252 03/12/21 0411  NA 138 141 138  K 4.1 3.8 4.1  CL 112* 114* 110  CO2 20* 21* 21*  GLUCOSE  170* 96 141*  BUN 37* 26* 17  CREATININE 1.14* 1.03* 1.32*  CALCIUM 8.3* 8.2* 8.4*   LFT Recent Labs    03/12/21 0411  PROT 4.9*  ALBUMIN 2.9*  AST 12*  ALT 10  ALKPHOS 43  BILITOT 0.9   PT/INR Recent Labs    03/10/21 1028  LABPROT 15.4*  INR 1.3*        Assessment / Plan:    #66 80 year old white female admitted with severe anemia, heme positive stool and complaint of very dark stool at home.  On aspirin.  Hemoglobin 5 7 on admission, she has been transfused 2 units of packed RBCs and hemoglobin has been stable since, 7.7 this a.m.  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  There is no active bleeding at the time of the procedure  Capsule endoscopy interpreted today, unfortunately the capsule was retained in the stomach for the entire study.  She had a scant amount of heme in the stomach felt secondary to scope trauma  I discussed capsule findings with the patient today, Discussed potential repeat EGD enteroscopy and then placement of the capsule into the duodenum for further evaluation.  She is not inclined to have any further  procedures and is wanting to go home and have her blood counts followed as an outpatient..  Advised that we at least need to make sure that her hemoglobin is stable prior to discharge.  We will repeat hemoglobin now-if she has had any further drift in hemoglobin would not discharge and will rediscuss enteroscopy and deployment of capsule for repeat capsule endoscopy. If hemoglobin is stable and patient wants to be discharged to home she should have her hemoglobin checked by her PCP later this week and will need serial hemoglobins over the next couple of weeks to assure stability.  Addendum-hemoglobin stable this afternoon at 7.8. I discussed the patient's situation with her daughter by phone.  Family prefers that patient remain in the hospital and have what ever  further procedures are indicated to get a definitive diagnosis.  Daughter had spoken with her mom who is now agreeable to stay in the hospital..  She will be scheduled for small bowel enteroscopy and placement of capsule into the small bowel tomorrow for 14 2022 with Dr. Hilarie Warner.  Procedure was discussed in detail with the patient's daughter and had previously discussed with patient earlier today.  Continue serial hemoglobins Clear liquid diet this evening n.p.o. after midnight except for prep.  Will take a half bowel prep early a.m. prior to placement of capsule.   Principal Problem:   Upper GI bleed Active Problems:   Atrial fibrillation (HCC)   Hypothyroid   CML (chronic myelocytic leukemia) (HCC)   Controlled type 2 diabetes mellitus without complication (HCC)   Stage III chronic kidney disease (HCC)   PAD (peripheral artery disease) ([place])   Acute blood loss anemia   Melena   Angiodysplasia of duodenum     LOS: 2 days   Sara Warner EsterwoodPA-C  03/12/2021, 12:39 PM

## 2021-03-12 NOTE — Progress Notes (Signed)
Consent form obtained and placed in pt's chart.  ° °Keigan Tafoya S Marshella Tello, RN ° °

## 2021-03-12 NOTE — Progress Notes (Signed)
PROGRESS NOTE    Sara Warner   EBR:[id number]  DOB: 1941-04-14  DOA: 03/10/2021 PCP: Cyndi Bender, PA-C   Brief Narrative:  Sara Warner is a 80 year old female with chronic atrial fibrillation, chronic systolic heart failure, CML, type 2 diabetes mellitus, peripheral artery disease, renal artery stenosis status post stent, hypothyroidism.  Presented to the [facility] ED for severe weakness and reported tarry stools about 5 days ago followed by no bowel movements. Her hemoglobin was found to be 7.3 her guaiac positive.  She was given 1 unit of packed red blood cells and discharged home. Presented to our ED on 4/11.  Subjective: Last bowel movement was yesterday and was black.  She is tolerating a diet.    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 which was treated with APC -Video capsule ends in the stomach -GI has seen the patient today and plans a repeat capsule enteroscopy with endoscopic placement of PillCam -Hemoglobin is 7.8 today-continue to follow -Nilotinib is on hold as it increases the risk of bleeding-her oncologist recommends to continue to hold this when she is discharged  Active Problems:   Atrial fibrillation ([place]) -Continue Toprol and as needed IV metoprolol    Hypothyroid -Continue Synthroid    CML (chronic myelocytic leukemia) -Holding nilotinib    Controlled type 2 diabetes mellitus without complication -Holding Januvia and alogliptin tied    Stage III chronic kidney disease -Continue to follow creatinine            Time spent in minutes: 3 5 DVT prophylaxis: SCDs Start: 03/10/21 1238  Code Status: Full code Family Communication: Daughter at bedside Level of Care: Level of care: Progressive Disposition Plan:  Status is: Inpatient  Remains inpatient appropriate because:Inpatient level of care appropriate due to severity of  illness   Dispo: The patient is from: Home              Anticipated d/c is to: Home              Patient currently is not medically stable to d/c.   Difficult to place patient No      Consultants:   GI Procedures:   EGD Antimicrobials:  Anti-infectives (From admission, onward)   None       Objective: Vitals:   03/12/21 0924 03/12/21 1057 03/12/21 1513 03/12/21 1615  BP: 123/61 (!) 117/54 123/63 117/63  Pulse: (!) 101 99 94 79  Resp: _0 Temp: 98.1 F (36.7 C) 98 F (36.7 C) 98.2 F (36.8 C) 98.2 F (36.8 C)  TempSrc: Oral Oral Oral Oral  SpO2: 95% 100% 96% 98%  Weight:      Height:        Intake/Output Summary (Last 24 hours) at 03/12/2021 1816 Last data filed at 03/12/2021 0800 Gross per 24 hour  Intake 263.33 ml  Output 350 ml  Net -86.67 ml   Filed Weights   03/10/21 1017 03/11/21 0417 03/12/21 0345  Weight: 81.6 kg 77.6 kg 78.8 kg    Examination: General exam: Appears comfortable  HEENT: PERRLA, oral mucosa moist, no sclera icterus or thrush Respiratory system: Clear to auscultation. Respiratory effort normal. Cardiovascular system: S1 & S2 heard, RRR.   Gastrointestinal system: Abdomen soft, non-tender, nondistended. Normal bowel sounds. Central nervous system: Alert and oriented. No focal neurological deficits. Extremities: No cyanosis, clubbing or edema Skin: No rashes or  ulcers Psychiatry:  Mood & affect appropriate.     Data Reviewed: I have personally reviewed following labs and imaging studies  CBC: Recent Labs  Lab 03/10/21 1028 03/10/21 2047 03/11/21 0252 03/11/21 2033 03/12/21 0411 03/12/21 1440  WBC 20.3*  --  16.1*  --  16.0*  --   NEUTROABS 18.3*  --   --   --   --   --   HGB 5.7* 8.2* 7.8* 8.0* 7.7* 7.8*  HCT 19.1* 25.9* 24.7* 24.8* 23.7* 24.7*  MCV 96.0  --  92.9  --  91.5  --   PLT 523*  --  428*  --  452*  --    Basic Metabolic Panel: Recent Labs  Lab 03/10/21 1028 03/11/21 0252 03/12/21 0411  NA  138 141 138  K 4.1 3.8 4.1  CL 112* 114* 110  CO2 20* 21* 21*  GLUCOSE 170* 96 141*  BUN 37* 26* 17  CREATININE 1.14* 1.03* 1.32*  CALCIUM 8.3* 8.2* 8.4*  MG  --   --  2.1  PHOS  --   --  2.9   GFR: Estimated Creatinine Clearance: 38.1 mL/min (A) (by C-G formula based on SCr of 1.32 mg/dL (H)). Liver Function Tests: Recent Labs  Lab 03/10/21 1028 03/12/21 0411  AST 9* 12*  ALT 11 10  ALKPHOS 42 43  BILITOT 0.8 0.9  PROT 5.3* 4.9*  ALBUMIN 2.8* 2.9*   No results for input(s): LIPASE, AMYLASE in the last 168 hours. No results for input(s): AMMONIA in the last 168 hours. Coagulation Profile: Recent Labs  Lab 03/10/21 1028  INR 1.3*   Cardiac Enzymes: No results for input(s): CKTOTAL, CKMB, CKMBINDEX, TROPONINI in the last 168 hours. BNP (last 3 results) No results for input(s): PROBNP in the last 8760 hours. HbA1C: No results for input(s): HGBA1C in the last 72 hours. CBG: Recent Labs  Lab 03/11/21 1652 03/11/21 2112 03/12/21 0618 03/12/21 1059 03/12/21 1619  GLUCAP 171* 112* 136* 143* 139*   Lipid Profile: No results for input(s): CHOL, HDL, LDLCALC, TRIG, CHOLHDL, LDLDIRECT in the last 72 hours. Thyroid Function Tests: No results for input(s): TSH, T4TOTAL, FREET4, T3FREE, THYROIDAB in the last 72 hours. Anemia Panel: No results for input(s): VITAMINB12, FOLATE, FERRITIN, TIBC, IRON, RETICCTPCT in the last 72 hours. Urine analysis:    Component Value Date/Time   COLORURINE YELLOW 02/24/2021 [place] 02/24/2021 0833   LABSPEC 1.010 02/24/2021 0833   PHURINE 5.0 02/24/2021 0833   GLUCOSEU NEGATIVE 02/24/2021 0833   HGBUR SMALL (A) 02/24/2021 0833   BILIRUBINUR NEGATIVE 02/24/2021 0833   KETONESUR NEGATIVE 02/24/2021 0833   PROTEINUR NEGATIVE 02/24/2021 0833   NITRITE NEGATIVE 02/24/2021 0833   LEUKOCYTESUR NEGATIVE 02/24/2021 0833   Sepsis Labs: _0 (procalcitonin:4,lacticidven:4) ) Recent Results (from the past 240 hour(s))   Resp Panel by RT-PCR (Flu A&B, Covid) Nasopharyngeal Swab     Status: None   Collection Time: 03/10/21 10:39 AM   Specimen: Nasopharyngeal Swab; Nasopharyngeal(NP) swabs in vial transport medium  Result Value Ref Range Status   SARS Coronavirus 2 by RT PCR NEGATIVE NEGATIVE Final    Comment: (NOTE) SARS-CoV-2 target nucleic acids are NOT DETECTED.  The SARS-CoV-2 RNA is generally detectable in upper respiratory specimens during the acute phase of infection. The lowest concentration of SARS-CoV-2 viral copies this assay can detect is 138 copies/mL. A negative result does not preclude SARS-Cov-2 infection and should not be used as the sole basis for treatment or other patient  management decisions. A negative result may occur with  improper specimen collection/handling, submission of specimen other than nasopharyngeal swab, presence of viral mutation(s) within the areas targeted by this assay, and inadequate number of viral copies(<138 copies/mL). A negative result must be combined with clinical observations, patient history, and epidemiological information. The expected result is Negative.  Fact Sheet for Patients:  EntrepreneurPulse.com.au  Fact Sheet for Healthcare Providers:  IncredibleEmployment.be  This test is no t yet approved or cleared by the [place] FDA and  has been authorized for detection and/or diagnosis of SARS-CoV-2 by FDA under an Emergency Use Authorization (EUA). This EUA will remain  in effect (meaning this test can be used) for the duration of the COVID-19 declaration under Section 564(b)(1) of the Act, 21 U.S.C.section 360bbb-3(b)(1), unless the authorization is terminated  or revoked sooner.       Influenza A by PCR NEGATIVE NEGATIVE Final   Influenza B by PCR NEGATIVE NEGATIVE Final    Comment: (NOTE) The Xpert Xpress SARS-CoV-2/FLU/RSV plus assay is intended as an aid in the diagnosis of influenza from  Nasopharyngeal swab specimens and should not be used as a sole basis for treatment. Nasal washings and aspirates are unacceptable for Xpert Xpress SARS-CoV-2/FLU/RSV testing.  Fact Sheet for Patients: EntrepreneurPulse.com.au  Fact Sheet for Healthcare Providers: IncredibleEmployment.be  This test is not yet approved or cleared by the [place] FDA and has been authorized for detection and/or diagnosis of SARS-CoV-2 by FDA under an Emergency Use Authorization (EUA). This EUA will remain in effect (meaning this test can be used) for the duration of the COVID-19 declaration under Section 564(b)(1) of the Act, 21 U.S.C. section 360bbb-3(b)(1), unless the authorization is terminated or revoked.  Performed at [facility], [place] [address] [place], [place] [address]          Radiology Studies: No results found.    Scheduled Meds: . digoxin  0.0625 mg Oral QODAY  . insulin aspart  0-9 Units Subcutaneous TID WC  . labetalol  20 mg Intravenous Once  . levothyroxine  125 mcg Oral QAC breakfast  . loratadine  10 mg Oral Daily  . metoprolol succinate  50 mg Oral Daily  . pantoprazole  40 mg Oral Q0600  . [START ON 03/13/2021] peg 3350 powder  0.5 kit Oral Once  . rosuvastatin  10 mg Oral Daily  . sodium chloride flush  3 mL Intravenous Q12H   Continuous Infusions: . lactated ringers 10 mL/hr at 03/12/21 0415     LOS: 2 days      Debbe Odea, MD [facility] Pager: [email or web address] 03/12/2021, 6:16 PM

## 2021-03-12 NOTE — Anesthesia Postprocedure Evaluation (Signed)
Anesthesia Post Note  Patient: Sara Warner  Procedure(s) Performed: ESOPHAGOGASTRODUODENOSCOPY (EGD) WITH PROPOFOL (N/A ) HOT HEMOSTASIS (ARGON PLASMA COAGULATION/BICAP) (N/A ) GIVENS CAPSULE STUDY (N/A )     Patient location during evaluation: Endoscopy Anesthesia Type: MAC Level of consciousness: awake and alert Pain management: pain level controlled Vital Signs Assessment: post-procedure vital signs reviewed and stable Respiratory status: spontaneous breathing, nonlabored ventilation, respiratory function stable and patient connected to nasal cannula oxygen Cardiovascular status: blood pressure returned to baseline and stable Postop Assessment: no apparent nausea or vomiting Anesthetic complications: no   No complications documented.  Last Vitals:  Vitals:   03/12/21 0345 03/12/21 0724  BP: 125/68 134/68  Pulse: (!) 105 (!) 106  Resp: 19 17  Temp: 36.7 C 36.8 C  SpO2: 99% 98%    Last Pain:  Vitals:   03/12/21 0724  TempSrc: Oral  PainSc:                  Kasra Melvin L Oshay Stranahan

## 2021-03-13 ENCOUNTER — Inpatient Hospital Stay (HOSPITAL_COMMUNITY): Payer: PPO | Admitting: Certified Registered"

## 2021-03-13 ENCOUNTER — Encounter (HOSPITAL_COMMUNITY)
Admission: EM | Disposition: A | Discharge status: Home Health | Payer: Self-pay | Source: Home / Self Care | Attending: Internal Medicine

## 2021-03-13 ENCOUNTER — Encounter (HOSPITAL_COMMUNITY): Payer: Self-pay | Admitting: Internal Medicine

## 2021-03-13 HISTORY — PX: GIVENS CAPSULE STUDY: SHX5432

## 2021-03-13 HISTORY — PX: ENTEROSCOPY: SHX5533

## 2021-03-13 LAB — HEMOGLOBIN AND HEMATOCRIT, BLOOD
HCT: 25.3 % — ABNORMAL LOW (ref 36.0–46.0)
Hemoglobin: 7.9 g/dL — ABNORMAL LOW (ref 12.0–15.0)

## 2021-03-13 LAB — GLUCOSE, CAPILLARY
Glucose-Capillary: 129 mg/dL — ABNORMAL HIGH (ref 70–99)
Glucose-Capillary: 100 mg/dL — ABNORMAL HIGH (ref 70–99)
Glucose-Capillary: 140 mg/dL — ABNORMAL HIGH (ref 70–99)

## 2021-03-13 SURGERY — ENTEROSCOPY
Anesthesia: Monitor Anesthesia Care

## 2021-03-13 MED ORDER — PROPOFOL 10 MG/ML IV BOLUS
INTRAVENOUS | Status: DC | PRN
  Administered 2021-03-13: 40 mg via INTRAVENOUS

## 2021-03-13 MED ORDER — PHENYLEPHRINE 40 MCG/ML (10ML) SYRINGE FOR IV PUSH (FOR BLOOD PRESSURE SUPPORT)
PREFILLED_SYRINGE | INTRAVENOUS | Status: DC | PRN
  Administered 2021-03-13: 80 ug via INTRAVENOUS
  Administered 2021-03-13 (×2): 200 ug via INTRAVENOUS
  Administered 2021-03-13: 120 ug via INTRAVENOUS

## 2021-03-13 MED ORDER — PROPOFOL 500 MG/50ML IV EMUL
INTRAVENOUS | Status: DC | PRN
  Administered 2021-03-13 (×2): 130 ug/kg/min via INTRAVENOUS

## 2021-03-13 SURGICAL SUPPLY — 1 items: TOWEL COTTON PACK 4EA (MISCELLANEOUS) ×4 IMPLANT

## 2021-03-13 NOTE — Op Note (Signed)
[facility] Patient Name: Sara Warner Procedure Date : 03/13/2021 MRN: [id number] Attending MD: Jerene Bears , MD Date of Birth: 11/06/1941 CSN: [id number] Age: 80 Admit Type: Inpatient Procedure:                Small bowel enteroscopy Indications:              Melena, acute anemia and recent unsuccessful VCE                            with capsule retained in the stomach x 12 hours Providers:                Lajuan Lines. Hilarie Fredrickson, MD, Clyde Lundborg, RN, Laverda Sorenson, Technician, Lance Coon, CRNA Referring MD:             [facility] Medicines:                Monitored Anesthesia Care Complications:            No immediate complications. Estimated Blood Loss:     Estimated blood loss was minimal. Procedure:                Pre-Anesthesia Assessment:                           - Prior to the procedure, a History and Physical                            was performed, and patient medications and                            allergies were reviewed. The patient's tolerance of                            previous anesthesia was also reviewed. The risks                            and benefits of the procedure and the sedation                            options and risks were discussed with the patient.                            All questions were answered, and informed consent                            was obtained. Prior Anticoagulants: The patient has                            taken no previous anticoagulant or antiplatelet                            agents. ASA Grade Assessment: III - A patient with  severe systemic disease. After reviewing the risks                            and benefits, the patient was deemed in                            satisfactory condition to undergo the procedure.                           After obtaining informed consent, the endoscope was                            passed under  direct vision. Throughout the                            procedure, the patient's blood pressure, pulse, and                            oxygen saturations were monitored continuously. The                            [id number]-PH190L ([id number]) Olympus ultra slim colonoscope                            was introduced through the mouth and advanced to                            the proximal jejunum. The small bowel enteroscopy                            was accomplished without difficulty. The patient                            tolerated the procedure well. Scope In: Scope Out: Findings:      The esophagus was normal. Small hiatal hernia.      The stomach was normal.      The examined duodenum was normal. Small ulceration from recent APC seen       in D2.      There was no evidence of significant pathology in the entire examined       portion of proximal jejunum.      Using the endoscope, the video capsule was advanced into the duodenal       bulb and successfully deployed. Impression:               - Normal esophagus.                           - Normal stomach.                           - Normal examined duodenum.                           - The examined portion of the jejunum was normal.                           -  Successful completion of the Video Capsule                            Enteroscope placement.                           - No specimens collected. Moderate Sedation:      N/A Recommendation:           - Return patient to hospital ward for ongoing care.                           - Diet per video capsule protocol.                           - Expect she can go home tomorrow if Hgb stable.                           - Will need attention to Hgb and iron studies as an                            outpatient. Procedure Code(s):        --- Professional ---                           [phone], Small intestinal endoscopy, enteroscopy                            beyond second portion of duodenum,  not including                            ileum; diagnostic, including collection of                            specimen(s) by brushing or washing, when performed                            (separate procedure) Diagnosis Code(s):        --- Professional ---                           K92.1, Melena (includes Hematochezia) CPT copyright 2019 American Medical Association. All rights reserved. The codes documented in this report are preliminary and upon coder review may  be revised to meet current compliance requirements. Jerene Bears, MD 03/13/2021 1:36:57 PM This report has been signed electronically. Number of Addenda: 0

## 2021-03-13 NOTE — Anesthesia Preprocedure Evaluation (Addendum)
Anesthesia Evaluation  Patient identified by MRN, date of birth, ID band Patient awake    Reviewed: Allergy & Precautions, NPO status , Patient's Chart, lab work & pertinent test results  History of Anesthesia Complications Negative for: history of anesthetic complications  Airway Mallampati: III  TM Distance: >3 FB Neck ROM: Full    Dental  (+) Teeth Intact, Dental Advisory Given   Pulmonary neg shortness of breath, neg COPD, neg recent URI, former smoker,  Covid-19 Nucleic Acid Test Results Lab Results      Component                Value               Date                      [place]              NEGATIVE            03/10/2021                [place]              NEGATIVE            02/20/2021                [place]              NEGATIVE            02/15/2021                [place]              NEGATIVE            06/19/2020                [place]              NEGATIVE            04/03/2020              breath sounds clear to auscultation       Cardiovascular hypertension, Pt. on medications and Pt. on home beta blockers + Peripheral Vascular Disease and +CHF  + dysrhythmias  Rhythm:Regular     Neuro/Psych negative neurological ROS  negative psych ROS   GI/Hepatic Neg liver ROS, GERD  Medicated,? Gi bleed   Endo/Other  diabetes, Type 2Hypothyroidism   Renal/GU ARF and CRFRenal diseaseLab Results      Component                Value               Date                      CREATININE               1.32 (H)            03/12/2021           Lab Results      Component                Value               Date                      K  4.1                 03/12/2021                Musculoskeletal   Abdominal   Peds  Hematology  (+) Blood dyscrasia, anemia , Lab Results      Component                Value               Date                      WBC                      16.0 (H)             03/12/2021                HGB                      7.9 (L)             03/13/2021                HCT                      25.3 (L)            03/13/2021                MCV                      91.5                03/12/2021                PLT                      452 (H)             03/12/2021              Anesthesia Other Findings   Reproductive/Obstetrics                           Anesthesia Physical Anesthesia Plan  ASA: III  Anesthesia Plan: MAC   Post-op Pain Management:    Induction: Intravenous  PONV Risk Score and Plan: 2 and Propofol infusion and Treatment may vary due to age or medical condition  Airway Management Planned: Nasal Cannula  Additional Equipment: None  Intra-op Plan:   Post-operative Plan:   Informed Consent: I have reviewed the patients History and Physical, chart, labs and discussed the procedure including the risks, benefits and alternatives for the proposed anesthesia with the patient or authorized representative who has indicated his/her understanding and acceptance.     Dental advisory given  Plan Discussed with: CRNA  Anesthesia Plan Comments:         Anesthesia Quick Evaluation

## 2021-03-13 NOTE — Progress Notes (Signed)
Pill cam delivered into stomach during endoscopic procedure at 1325 03/13/2021. NPO until 1525. Light snack at 1725. Previously ordered diet at 2225. Belt can be removed at 0125 03/14/2021. Instructions given to room RN.

## 2021-03-13 NOTE — Transfer of Care (Signed)
Immediate Anesthesia Transfer of Care Note  Patient: Sara Warner  Procedure(s) Performed: ENTEROSCOPY (N/A ) GIVENS CAPSULE STUDY (N/A )  Patient Location: Endoscopy Unit  Anesthesia Type:MAC  Level of Consciousness: drowsy and patient cooperative  Airway & Oxygen Therapy: Patient Spontanous Breathing  Post-op Assessment: Report given to RN and Post -op Vital signs reviewed and stable  Post vital signs: Reviewed and stable  Last Vitals:  Vitals Value Taken Time  BP 74/29 03/13/21 1333  Temp    Pulse 85 03/13/21 1334  Resp 15 03/13/21 1334  SpO2 99 % 03/13/21 1334  Vitals shown include unvalidated device data.  Last Pain:  Vitals:   03/13/21 1200  TempSrc: Oral  PainSc: 0-No pain         Complications: No complications documented.

## 2021-03-13 NOTE — Progress Notes (Signed)
Pt came back to rm 22 from endo. Reinitiated tele. VSS. Call bell within reach  Lavenia Atlas, RN

## 2021-03-13 NOTE — Progress Notes (Signed)
PROGRESS NOTE    Sara Warner   QBH:[id number]  DOB: Dec 17, 1940  DOA: 03/10/2021 PCP: Cyndi Bender, PA-C   Brief Narrative:  Sara Warner is a 80 year old female with chronic atrial fibrillation, chronic systolic heart failure, CML, type 2 diabetes mellitus, peripheral artery disease, renal artery stenosis status post stent, hypothyroidism.  Presented to the [facility] ED for severe weakness and reported tarry stools about 5 days ago followed by no bowel movements. Her hemoglobin was found to be 7.3 & guaiac positive.  She was given 1 unit of packed red blood cells and discharged home. Presented to our ED on 4/11.  Subjective: Having brown BMs now. No complaints.     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 which was treated with APC -Video capsule ends in the stomach -GI has seen the patient today and plans a repeat capsule enteroscopy with endoscopic placement of PillCam>> PillCam placed today -Hemoglobin is 7.9 today-continue to follow -Nilotinib is on hold as it increases the risk of bleeding-her oncologist recommends to continue to hold this when she is discharged  Active Problems:   Chronic Atrial fibrillation (HCC) -Continue Toprol and as needed IV metoprolol  Chronic systolic CHF - holding spironolactone and Lasix (she has mostly been NPO and weight is stable)    Hypothyroid -Continue Synthroid    CML (chronic myelocytic leukemia) -Holding Nilotinib    Controlled type 2 diabetes mellitus without complication -Holding Januvia, Dulaglutide and Glipizide- Sugars < 200    Stage III chronic kidney disease -Continue to follow creatinine     Time spent in minutes: 3 5 DVT prophylaxis: SCDs Start: 03/10/21 1238 Code Status: Full code Family Communication: Daughter at bedside Level of Care: Level of care: Progressive Disposition Plan:  Status is:  Inpatient  Remains inpatient appropriate because:Inpatient level of care appropriate due to severity of illness   Dispo: The patient is from: Home              Anticipated d/c is to: Home              Patient currently is not medically stable to d/c.   Difficult to place patient No      Consultants:   GI Procedures:   EGD Antimicrobials:  Anti-infectives (From admission, onward)   None       Objective: Vitals:   03/13/21 1347 03/13/21 1350 03/13/21 1355 03/13/21 1425  BP:  (!) 117/57 (!) 114/46 (!) 114/53  Pulse:  72 (!) 37 66  Resp:  15 14 12   Temp:    (!) 97.5 F (36.4 C)  TempSrc:    Oral  SpO2:  100% 98% 96%  Weight: 78.8 kg     Height: 5\' 8"  (1.727 m)       Intake/Output Summary (Last 24 hours) at 03/13/2021 1602 Last data filed at 03/13/2021 1329 Gross per 24 hour  Intake 300 ml  Output --  Net 300 ml   Filed Weights   03/11/21 0417 03/12/21 0345 03/13/21 1347  Weight: 77.6 kg 78.8 kg 78.8 kg    Examination: General exam: Appears comfortable  HEENT: PERRLA, oral mucosa moist, no sclera icterus or thrush Respiratory system: Clear to auscultation. Respiratory effort normal. Cardiovascular system: S1 & S2 heard, regular rate and rhythm Gastrointestinal system: Abdomen soft, non-tender, nondistended. Normal bowel sounds   Central nervous system: Alert and oriented. No focal neurological deficits.  Extremities: No cyanosis, clubbing or edema Skin: No rashes or ulcers Psychiatry:  Mood & affect appropriate.     Data Reviewed: I have personally reviewed following labs and imaging studies  CBC: Recent Labs  Lab 03/10/21 1028 03/10/21 2047 03/11/21 0252 03/11/21 2033 03/12/21 0411 03/12/21 1440 03/13/21 0203  WBC 20.3*  --  16.1*  --  16.0*  --   --   NEUTROABS 18.3*  --   --   --   --   --   --   HGB 5.7*   < > 7.8* 8.0* 7.7* 7.8* 7.9*  HCT 19.1*   < > 24.7* 24.8* 23.7* 24.7* 25.3*  MCV 96.0  --  92.9  --  91.5  --   --   PLT 523*  --   428*  --  452*  --   --    < > = values in this interval not displayed.   Basic Metabolic Panel: Recent Labs  Lab 03/10/21 1028 03/11/21 0252 03/12/21 0411  NA 138 141 138  K 4.1 3.8 4.1  CL 112* 114* 110  CO2 20* 21* 21*  GLUCOSE 170* 96 141*  BUN 37* 26* 17  CREATININE 1.14* 1.03* 1.32*  CALCIUM 8.3* 8.2* 8.4*  MG  --   --  2.1  PHOS  --   --  2.9   GFR: Estimated Creatinine Clearance: 38.1 mL/min (A) (by C-G formula based on SCr of 1.32 mg/dL (H)). Liver Function Tests: Recent Labs  Lab 03/10/21 1028 03/12/21 0411  AST 9* 12*  ALT 11 10  ALKPHOS 42 43  BILITOT 0.8 0.9  PROT 5.3* 4.9*  ALBUMIN 2.8* 2.9*   No results for input(s): LIPASE, AMYLASE in the last 168 hours. No results for input(s): AMMONIA in the last 168 hours. Coagulation Profile: Recent Labs  Lab 03/10/21 1028  INR 1.3*   Cardiac Enzymes: No results for input(s): CKTOTAL, CKMB, CKMBINDEX, TROPONINI in the last 168 hours. BNP (last 3 results) No results for input(s): PROBNP in the last 8760 hours. HbA1C: No results for input(s): HGBA1C in the last 72 hours. CBG: Recent Labs  Lab 03/12/21 0618 03/12/21 1059 03/12/21 1619 03/12/21 2231 03/13/21 0632  GLUCAP 136* 143* 139* 125* 129*   Lipid Profile: No results for input(s): CHOL, HDL, LDLCALC, TRIG, CHOLHDL, LDLDIRECT in the last 72 hours. Thyroid Function Tests: No results for input(s): TSH, T4TOTAL, FREET4, T3FREE, THYROIDAB in the last 72 hours. Anemia Panel: No results for input(s): VITAMINB12, FOLATE, FERRITIN, TIBC, IRON, RETICCTPCT in the last 72 hours. Urine analysis:    Component Value Date/Time   COLORURINE YELLOW 02/24/2021 [place] 02/24/2021 0833   LABSPEC 1.010 02/24/2021 0833   PHURINE 5.0 02/24/2021 0833   GLUCOSEU NEGATIVE 02/24/2021 0833   HGBUR SMALL (A) 02/24/2021 0833   BILIRUBINUR NEGATIVE 02/24/2021 0833   KETONESUR NEGATIVE 02/24/2021 0833   PROTEINUR NEGATIVE 02/24/2021 0833   NITRITE  NEGATIVE 02/24/2021 0833   LEUKOCYTESUR NEGATIVE 02/24/2021 0833   Sepsis Labs: @LABRCNTIP (procalcitonin:4,lacticidven:4) ) Recent Results (from the past 240 hour(s))  Resp Panel by RT-PCR (Flu A&B, Covid) Nasopharyngeal Swab     Status: None   Collection Time: 03/10/21 10:39 AM   Specimen: Nasopharyngeal Swab; Nasopharyngeal(NP) swabs in vial transport medium  Result Value Ref Range Status   SARS Coronavirus 2 by RT PCR NEGATIVE NEGATIVE Final    Comment: (NOTE) SARS-CoV-2 target nucleic acids are NOT DETECTED.  The SARS-CoV-2 RNA is generally detectable in upper respiratory  specimens during the acute phase of infection. The lowest concentration of SARS-CoV-2 viral copies this assay can detect is 138 copies/mL. A negative result does not preclude SARS-Cov-2 infection and should not be used as the sole basis for treatment or other patient management decisions. A negative result may occur with  improper specimen collection/handling, submission of specimen other than nasopharyngeal swab, presence of viral mutation(s) within the areas targeted by this assay, and inadequate number of viral copies(<138 copies/mL). A negative result must be combined with clinical observations, patient history, and epidemiological information. The expected result is Negative.  Fact Sheet for Patients:  EntrepreneurPulse.com.au  Fact Sheet for Healthcare Providers:  IncredibleEmployment.be  This test is no t yet approved or cleared by the [place] FDA and  has been authorized for detection and/or diagnosis of SARS-CoV-2 by FDA under an Emergency Use Authorization (EUA). This EUA will remain  in effect (meaning this test can be used) for the duration of the COVID-19 declaration under Section 564(b)(1) of the Act, 21 U.S.C.section 360bbb-3(b)(1), unless the authorization is terminated  or revoked sooner.       Influenza A by PCR NEGATIVE NEGATIVE Final    Influenza B by PCR NEGATIVE NEGATIVE Final    Comment: (NOTE) The Xpert Xpress SARS-CoV-2/FLU/RSV plus assay is intended as an aid in the diagnosis of influenza from Nasopharyngeal swab specimens and should not be used as a sole basis for treatment. Nasal washings and aspirates are unacceptable for Xpert Xpress SARS-CoV-2/FLU/RSV testing.  Fact Sheet for Patients: EntrepreneurPulse.com.au  Fact Sheet for Healthcare Providers: IncredibleEmployment.be  This test is not yet approved or cleared by the [place] FDA and has been authorized for detection and/or diagnosis of SARS-CoV-2 by FDA under an Emergency Use Authorization (EUA). This EUA will remain in effect (meaning this test can be used) for the duration of the COVID-19 declaration under Section 564(b)(1) of the Act, 21 U.S.C. section 360bbb-3(b)(1), unless the authorization is terminated or revoked.  Performed at [facility], [place] [address] [place], [place] [address]          Radiology Studies: No results found.    Scheduled Meds: . digoxin  0.0625 mg Oral QODAY  . insulin aspart  0-9 Units Subcutaneous TID WC  . labetalol  20 mg Intravenous Once  . levothyroxine  125 mcg Oral QAC breakfast  . loratadine  10 mg Oral Daily  . metoprolol succinate  50 mg Oral Daily  . pantoprazole  40 mg Oral Q0600  . rosuvastatin  10 mg Oral Daily  . sodium chloride flush  3 mL Intravenous Q12H   Continuous Infusions: . lactated ringers Stopped (03/13/21 1329)     LOS: 3 days      Debbe Odea, MD [facility] Pager: [email or web address] 03/13/2021, 4:02 PM

## 2021-03-13 NOTE — Interval H&P Note (Signed)
History and Physical Interval Note: For SBE with VCE to eval heme + stools and anemia after recent VCE failed to exit the stomach in the study period. The nature of the procedure, as well as the risks, benefits, and alternatives were carefully and thoroughly reviewed with the patient. Ample time for discussion and questions allowed. The patient understood, was satisfied, and agreed to proceed.    03/13/2021 12:41 PM  Sara Warner  has presented today for surgery, with the diagnosis of Anemia, melena.  The various methods of treatment have been discussed with the patient and family. After consideration of risks, benefits and other options for treatment, the patient has consented to  Procedure(s): ENTEROSCOPY (N/A) GIVENS CAPSULE STUDY (N/A) as a surgical intervention.  The patient's history has been reviewed, patient examined, no change in status, stable for surgery.  I have reviewed the patient's chart and labs.  Questions were answered to the patient's satisfaction.     Lajuan Lines Theresea Trautmann

## 2021-03-13 NOTE — Care Management Important Message (Signed)
Important Message  Patient Details  Name: Sara Warner MRN: [id number] Date of Birth: May 01, 1941   Medicare Important Message Given:  Yes     [name] Montine Circle 03/13/2021, 3:48 PM

## 2021-03-13 NOTE — Anesthesia Postprocedure Evaluation (Signed)
Anesthesia Post Note  Patient: Sara Warner  Procedure(s) Performed: ENTEROSCOPY (N/A ) GIVENS CAPSULE STUDY (N/A )     Patient location during evaluation: Endoscopy Anesthesia Type: MAC Level of consciousness: awake and alert Pain management: pain level controlled Vital Signs Assessment: post-procedure vital signs reviewed and stable Respiratory status: spontaneous breathing, nonlabored ventilation and respiratory function stable Cardiovascular status: blood pressure returned to baseline and stable Postop Assessment: no apparent nausea or vomiting Anesthetic complications: no   No complications documented.  Last Vitals:  Vitals:   03/13/21 1355 03/13/21 1425  BP: (!) 114/46 (!) 114/53  Pulse: (!) 37 66  Resp: 14 12  Temp:  (!) 36.4 C  SpO2: 98% 96%    Last Pain:  Vitals:   03/13/21 1425  TempSrc: Oral  PainSc:                  Lidia Collum

## 2021-03-14 ENCOUNTER — Encounter (HOSPITAL_COMMUNITY): Payer: Self-pay | Admitting: Internal Medicine

## 2021-03-14 DIAGNOSIS — I4891 Unspecified atrial fibrillation: Secondary | ICD-10-CM

## 2021-03-14 DIAGNOSIS — K31819 Angiodysplasia of stomach and duodenum without bleeding: Secondary | ICD-10-CM

## 2021-03-14 LAB — BASIC METABOLIC PANEL
Anion gap: 8 (ref 5–15)
BUN: 12 mg/dL (ref 8–23)
CO2: 21 mmol/L — ABNORMAL LOW (ref 22–32)
Calcium: 8.6 mg/dL — ABNORMAL LOW (ref 8.9–10.3)
Chloride: 110 mmol/L (ref 98–111)
Creatinine, Ser: 1.1 mg/dL — ABNORMAL HIGH (ref 0.44–1.00)
GFR, Estimated: 51 mL/min — ABNORMAL LOW (ref >60)
Glucose, Bld: 104 mg/dL — ABNORMAL HIGH (ref 70–99)
Potassium: 3.9 mmol/L (ref 3.5–5.1)
Sodium: 139 mmol/L (ref 135–145)

## 2021-03-14 LAB — CBC
HCT: 24.2 % — ABNORMAL LOW (ref 36.0–46.0)
Hemoglobin: 7.4 g/dL — ABNORMAL LOW (ref 12.0–15.0)
MCH: 29 pg (ref 26.0–34.0)
MCHC: 30.6 g/dL (ref 30.0–36.0)
MCV: 94.9 fL (ref 80.0–100.0)
Platelets: 457 10*3/uL — ABNORMAL HIGH (ref 150–400)
RBC: 2.55 MIL/uL — ABNORMAL LOW (ref 3.87–5.11)
RDW: 17.6 % — ABNORMAL HIGH (ref 11.5–15.5)
WBC: 11.7 10*3/uL — ABNORMAL HIGH (ref 4.0–10.5)
nRBC: 0 % (ref 0.0–0.2)

## 2021-03-14 LAB — GLUCOSE, CAPILLARY
Glucose-Capillary: 119 mg/dL — ABNORMAL HIGH (ref 70–99)
Glucose-Capillary: 112 mg/dL — ABNORMAL HIGH (ref 70–99)

## 2021-03-14 NOTE — Progress Notes (Addendum)
Patient ID: Sara Warner, female   DOB: 1940-12-17, 80 y.o.   MRN: [id number]    Progress Note   Subjective  Day # 4 CC; melena, anemia  Hemoglobin 7.4 today  No active bleeding  Patient says she feels fine and would like to be discharged.  Capsule endoscopy-read today, there is no active bleeding in the entire small bowel.  Capsule reached the colon.  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 of the duodenal AVM done earlier this week.   Objective   Vital signs in last 24 hours: Temp:  [97.7 F (36.5 C)-98.7 F (37.1 C)] 97.7 F (36.5 C) (04/15 1131) Pulse Rate:  [82-97] 97 (04/15 1131) Resp:  [15-19] 16 (04/15 1131) BP: (102-129)/(52-72) 102/60 (04/15 1131) SpO2:  [96 %-99 %] 98 % (04/15 1131) Weight:  [77.9 kg] 77.9 kg (04/15 0547) Last BM Date: 03/13/21 General: Elderly    white female in NAD Heart:  Regular rate and rhythm; no murmurs Lungs: Respirations even and unlabored, lungs CTA bilaterally Abdomen:  Soft, nontender and nondistended. Normal bowel sounds. Extremities:  Without edema. Neurologic:  Alert and oriented,  grossly normal neurologically. Psych:  Cooperative. Normal mood and affect.  Intake/Output from previous day: 04/14 0701 - 04/15 0700 In: 300 [I.V.:300] Out: -  Intake/Output this shift: No intake/output data recorded.  Lab Results: Recent Labs    03/12/21 0411 03/12/21 1440 03/13/21 0203 03/14/21 0250  WBC 16.0*  --   --  11.7*  HGB 7.7* 7.8* 7.9* 7.4*  HCT 23.7* 24.7* 25.3* 24.2*  PLT 452*  --   --  457*   BMET Recent Labs    03/12/21 0411 03/14/21 0250  NA 138 139  K 4.1 3.9  CL 110 110  CO2 21* 21*  GLUCOSE 141* 104*  BUN 17 12  CREATININE 1.32* 1.10*  CALCIUM 8.4* 8.6*   LFT Recent Labs    03/12/21 0411  PROT 4.9*  ALBUMIN 2.9*  AST 12*  ALT 10  ALKPHOS 43  BILITOT 0.9   PT/INR No results for input(s): LABPROT, INR in the last 72 hours.  Studies/Results: No  results found.     Assessment / Plan:    #49 80 year old white female admitted with weakness anemia and history of tarry stools.  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  Had capsule endoscopy as outlined above there is no blood in the entire small bowel and capsule reached the colon. 2 very small AVMs were noted more distally in the small bowel, nonbleeding  Patient's bleeding has been secondary to the small bowel AVMs  She may be discharged home from a GI perspective OK to resume aspirin as she had a recent vascular procedure Resume oral iron supplementation She will need serial hemoglobins done through her PCP, advised patient and daughter to have her hemoglobin checked on Monday or Tuesday of this coming week and then may need to be checked weekly for stability in the short-term  If she has further active bleeding with gross melena advised may need to return to the ER for evaluation.    Principal Problem:   Upper GI bleed Active Problems:   Atrial fibrillation (HCC)   Hypothyroid   CML (chronic myelocytic leukemia) (HCC)   Controlled type 2 diabetes mellitus  without complication ([place])   Stage III chronic kidney disease ([place])   PAD (peripheral artery disease) (HCC)   Acute blood loss anemia   Melena   Angiodysplasia of duodenum     LOS: 4 days   Trecia Maring  03/14/2021, 4:19 PM

## 2021-03-14 NOTE — Discharge Instructions (Signed)

## 2021-03-14 NOTE — Progress Notes (Signed)
Pt discharged to home with daughter.  Pt's IV removed.  Pt taken off telemetry and CCMD notified. Pt left with all of their personal belongings.  AVS documentation reviewed and sent home with Pt and all questions answered.    

## 2021-03-14 NOTE — Discharge Summary (Signed)
Physician Discharge Summary  Sara Warner BLT:[id number] DOB: 1941-10-24 DOA: 03/10/2021  PCP: Cyndi Bender, PA-C  Admit date: 03/10/2021 Discharge date: 03/14/2021  Admitted From: home Disposition:  home   Recommendations for Outpatient Follow-up:  1. F/u Hb weekly 2. The patient does not want to go back on aspirin- please follow  Home Health:  ordered  Discharge Condition:  stable   CODE STATUS:  Full code   Diet recommendation:  Heart healthy Consultations:  GI  Procedures/Studies: . EGD . Small bowel enteroscopy   Discharge Diagnoses:  Principal Problem:   Upper GI bleed Active Problems:   Atrial fibrillation (HCC)   Hypothyroid   CML (chronic myelocytic leukemia) (HCC)   Controlled type 2 diabetes mellitus without complication (HCC)   Stage III chronic kidney disease (HCC)   PAD (peripheral artery disease) (HCC)   Acute blood loss anemia   Melena   Angiodysplasia of duodenum     Brief Summary: Sara Warner is a 80 year old female with chronic atrial fibrillation, chronic systolic heart failure, CML, type 2 diabetes mellitus, peripheral artery disease, renal artery stenosis status post stent, hypothyroidism.  Presented to the [facility] ED for severe weakness and reported tarry stools about 5 days ago followed by no bowel movements. Her hemoglobin was found to be 7.3 & guaiac positive.  She was given 1 unit of packed red blood cells and discharged home. Presented to our ED on 4/11 for weakness and admitted for a GI work up. Found to have A-fib with RVR. Given 500 cc NS.   Hospital Course:  Principal Problem:   Upper GI bleed with acute blood loss anemia -EGD on 4/12 revealed an angiectasia in the duodenum which was treated with APC -Video capsule placed but did not go beyone the stomach - 4/14> repeat capsule enteroscopy with endoscopic placement of PillCam - results show no bleeding- 2 very small AVMs were noted in the small intestine and a small  ulcer site from APC of the duodenal AVM done earlier this week. -Hemoglobin over the past few days has been stable from 7-8 range - bleeding suspected to be due to AVMs  Active Problems:   Chronic Atrial fibrillation (HCC) -Continue Toprol and as needed IV metoprolol - she is not on a DOAC at this time - she declines to go back on aspirin although GI has noted that it is ok to- I have reminded her of her risk for a CVA and have asked her to discuss it further with her PCP next week  Chronic systolic CHF - have been holding spironolactone and Lasix (she has mostly been NPO and weight is stable) - can resume at home    Hypothyroid -Continue Synthroid    CML (chronic myelocytic leukemia)  -Nilotinib is on hold as it increases the risk of bleeding-her oncologist recommends to continue to hold this when she is discharged  Controlled type 2 diabetes mellitus without complication -Holding Januvia, Dulaglutide and Glipizide- daughter state she was not giving them daily to prevent hypoglycemia as her sugars were not high at home - in hospital, sugars have been < 200 - A1c 3/28 was 8.3    Stage III chronic kidney disease -Continue to follow creatinine    Discharge Exam: Vitals:   03/14/21 0914 03/14/21 1131  BP: (!) 119/59 102/60  Pulse: 88 97  Resp: 16 16  Temp: 98 F (36.7 C) 97.7 F (36.5 C)  SpO2: 98% 98%   Vitals:   03/14/21 0009 03/14/21 0547 03/14/21 0076  03/14/21 1131  BP: (!) 122/52 121/72 (!) 119/59 102/60  Pulse: 84 94 88 97  Resp: 17 15 16 16   Temp: 98.7 F (37.1 C) 98 F (36.7 C) 98 F (36.7 C) 97.7 F (36.5 C)  TempSrc: Oral Oral Oral Oral  SpO2: 99% 98% 98% 98%  Weight:  77.9 kg    Height:        General: Pt is alert, awake, not in acute distress Cardiovascular: RRR, S1/S2 +, no rubs, no gallops Respiratory: CTA bilaterally, no wheezing, no rhonchi Abdominal: Soft, NT, ND, bowel sounds + Extremities: no edema, no cyanosis   Discharge  Instructions  Discharge Instructions    Diet - low sodium heart healthy   Complete by: As directed    Diet Carb Modified   Complete by: As directed    Increase activity slowly   Complete by: As directed    No wound care   Complete by: As directed      Allergies as of 03/14/2021      Reactions   Lisinopril Cough   Losartan Other (See Comments)   Patient reports nightmares/vivid dreams when taking.      Medication List    TAKE these medications   cetirizine 10 MG tablet Commonly known as: ZYRTEC Take 10 mg by mouth daily.   clorazepate 7.5 MG tablet Commonly known as: Tranxene-T Take 1 tablet (7.5 mg total) by mouth 2 (two) times daily as needed for anxiety.   digoxin 0.125 MG tablet Commonly known as: LANOXIN Take 0.5 tablets (0.0625 mg total) by mouth every other day.   Dulaglutide 3 MG/0.5ML Sopn Inject 3 mg into the skin once a week.   furosemide 20 MG tablet Commonly known as: LASIX TAKE 2 TABLETS BY MOUTH IN THE MORNING AND AN EXTRA TABLET IF GAIN 3 OR MORE POUNDS IN 1 DAY What changed: See the new instructions.   glipiZIDE 10 MG 24 hr tablet Commonly known as: GLUCOTROL XL Take 10 mg by mouth 2 (two) times daily.   iron polysaccharides 150 MG capsule Commonly known as: NIFEREX Take 150 mg by mouth daily.   levothyroxine 125 MCG tablet Commonly known as: SYNTHROID Take 125 mcg by mouth daily before breakfast.   metoprolol 200 MG 24 hr tablet Commonly known as: TOPROL-XL TAKE 1 TABLET BY MOUTH EVERY DAY   nilotinib 150 MG capsule Commonly known as: Tasigna Take 2 capsules (300 mg total) by mouth every 12 (twelve) hours. Take on an empty stomach, 1 hour before or 2 hours after meals.   oxyCODONE 5 MG immediate release tablet Commonly known as: Roxicodone Take 1 tablet (5 mg total) by mouth every 8 (eight) hours as needed. What changed:   how much to take  when to take this  reasons to take this   pantoprazole 40 MG tablet Commonly known as:  PROTONIX Take 40 mg by mouth daily.   rosuvastatin 5 MG tablet Commonly known as: Crestor Take 2 tablets (10 mg total) by mouth daily.   sitaGLIPtin 50 MG tablet Commonly known as: JANUVIA Take 50 mg by mouth daily as needed (For diabeties).   spironolactone 25 MG tablet Commonly known as: ALDACTONE Take 0.5 tablets (12.5 mg total) by mouth 2 (two) times daily. What changed:   how much to take  when to take this   sucralfate 1 g tablet Commonly known as: CARAFATE Take 1 g by mouth 4 (four) times daily.       Follow-up Information  Cyndi Bender, PA-C. Schedule an appointment as soon as possible for a visit in 1 week(s).   Specialty: Physician Assistant Why: You will need your hemoglobin checked weekly for at least 1 month. Contact information: [place] [address] [phone]              Allergies  Allergen Reactions  . Lisinopril Cough  . Losartan Other (See Comments)    Patient reports nightmares/vivid dreams when taking.      PERIPHERAL VASCULAR CATHETERIZATION  Result Date: 02/18/2021 Patient name: Miko Markwood MRN: [id number] DOB: Jun 21, 1941 Sex: female 02/18/2021 Pre-operative Diagnosis: Left leg rest pain Post-operative diagnosis:  Same Surgeon:  Annamarie Major Procedure Performed:  1.  Ultrasound-guided access, right femoral artery  2.  Abdominal aortogram with CO2  3.  Second-order catheterization  4.  Left lower extremity runoff  5.  Stent, left renal artery  6.  Conscious sedation, 40 minutes Indications: This is a 80 year old female with history of bilateral femoral-popliteal bypass grafts and known renal artery stenosis with stage III CKD.  She recently presented to the emergency department with a 70-month history of left leg pain.  Ultrasound revealed that her bypass graft on the left was occluded.  She is here today for further imaging and intervention. Procedure:  The patient was identified in the holding area and taken to room  8.  The patient was then placed supine on the table and prepped and draped in the usual sterile fashion.  A time out was called.  Conscious sedation was administered with the use of IV fentanyl and Versed under continuous physician and nurse monitoring.  Heart rate, blood pressure, and oxygen saturation were continuously monitored.  Total sedation time was 40 minutes.  Ultrasound was used to evaluate the right common femoral artery.  It was patent .  A digital ultrasound image was acquired.  A micropuncture needle was used to access the right common femoral artery under ultrasound guidance.  An 018 wire was advanced without resistance and a micropuncture sheath was placed.  The 018 wire was removed and a benson wire was placed.  The micropuncture sheath was exchanged for a 5 french sheath.  An omniflush catheter was advanced over the wire to the level of L-1.  An abdominal angiogram with CO2 was obtained.  Next, using the omniflush catheter and a benson wire, the aortic bifurcation was crossed and the catheter was placed into theleft external iliac artery and left runoff was obtained.  Findings:  Aortogram: 90% left renal artery stenosis with poststenotic dilatation.  The right renal artery was not well visualized and appeared to be atretic.  The infrarenal abdominal aorta is widely patent.  The right common and external iliac arteries with the associated stents are patent without stenosis.  The left common and external iliac arteries are patent without stenosis.  Right Lower Extremity: Not evaluated  Left Lower Extremity: The left common femoral artery is occluded.  There is reconstitution of the profundofemoral artery.  The superficial femoral artery is occluded.  The bypass graft is occluded.  There is delayed reconstitution of the below-knee popliteal artery.  Runoff was difficult to determine secondary to proximal disease. Intervention: After the above images were acquired the decision made to proceed with  intervention.  The patient was fully heparinized.  Using a JR4 catheter and a 014 wire, the left renal artery was selected.  A 6 x 12 Herculink stent was deployed across the lesion.  Completion imaging showed that the  stent was not fully expanded and so I inserted a 7 x 15 balloon and took this to nominal pressure.  On follow-up imaging the renal artery stent was widely patent with no residual stenosis.  Catheters and wires were removed.  Patient taken over here for sheath pull once her coagulation profile corrects. Impression:  #1  90% left renal artery stenosis successfully stented using a 6 mm stent with no residual stenosis.  #2  No significant inflow disease bilaterally.  #3  The left common femoral artery was occluded with reconstitution of the proximal profundofemoral artery.  Superficial femoral artery was also occluded and there appears to be a below-knee popliteal artery that reconstitutes.  Runoff was not well visualized due to proximal disease  #4  We discussed proceeding with a femoral endarterectomy and to below-knee popliteal bypass graft  V. Annamarie Major, M.D., [facility] [place] Office: [phone] Pager:  [phone]  DG Abdomen Acute W/Chest  Result Date: 03/10/2021 CLINICAL DATA:  Abdominal pain and fatigue EXAM: DG ABDOMEN ACUTE WITH 1 VIEW CHEST COMPARISON:  CT abdomen and pelvis March 04, 2016 FINDINGS: PA chest: The lungs are clear. There is cardiomegaly with pulmonary vascularity normal. No adenopathy. There is aortic atherosclerosis. Supine and upright abdomen: There is fairly diffuse stool throughout the colon. There is no bowel dilatation or air-fluid level to suggest bowel obstruction. No free air. Right iliac artery region stent noted. Probable phleboliths in the pelvis. Surgical clips overlie the left acetabulum. IMPRESSION: Fairly diffuse stool throughout colon. No bowel obstruction or free air evident. Lungs clear. Cardiomegaly. Aortic  Atherosclerosis (ICD10-I70.0). Electronically Signed   By: Lowella Grip III M.D.   On: 03/10/2021 11:35      The results of significant diagnostics from this hospitalization (including imaging, microbiology, ancillary and laboratory) are listed below for reference.     Microbiology: Recent Results (from the past 240 hour(s))  Resp Panel by RT-PCR (Flu A&B, Covid) Nasopharyngeal Swab     Status: None   Collection Time: 03/10/21 10:39 AM   Specimen: Nasopharyngeal Swab; Nasopharyngeal(NP) swabs in vial transport medium  Result Value Ref Range Status   SARS Coronavirus 2 by RT PCR NEGATIVE NEGATIVE Final    Comment: (NOTE) SARS-CoV-2 target nucleic acids are NOT DETECTED.  The SARS-CoV-2 RNA is generally detectable in upper respiratory specimens during the acute phase of infection. The lowest concentration of SARS-CoV-2 viral copies this assay can detect is 138 copies/mL. A negative result does not preclude SARS-Cov-2 infection and should not be used as the sole basis for treatment or other patient management decisions. A negative result may occur with  improper specimen collection/handling, submission of specimen other than nasopharyngeal swab, presence of viral mutation(s) within the areas targeted by this assay, and inadequate number of viral copies(<138 copies/mL). A negative result must be combined with clinical observations, patient history, and epidemiological information. The expected result is Negative.  Fact Sheet for Patients:  EntrepreneurPulse.com.au  Fact Sheet for Healthcare Providers:  IncredibleEmployment.be  This test is no t yet approved or cleared by the [place] FDA and  has been authorized for detection and/or diagnosis of SARS-CoV-2 by FDA under an Emergency Use Authorization (EUA). This EUA will remain  in effect (meaning this test can be used) for the duration of the COVID-19 declaration under Section 564(b)(1)  of the Act, 21 U.S.C.section 360bbb-3(b)(1), unless the authorization is terminated  or revoked sooner.       Influenza A by PCR NEGATIVE NEGATIVE  Final   Influenza B by PCR NEGATIVE NEGATIVE Final    Comment: (NOTE) The Xpert Xpress SARS-CoV-2/FLU/RSV plus assay is intended as an aid in the diagnosis of influenza from Nasopharyngeal swab specimens and should not be used as a sole basis for treatment. Nasal washings and aspirates are unacceptable for Xpert Xpress SARS-CoV-2/FLU/RSV testing.  Fact Sheet for Patients: EntrepreneurPulse.com.au  Fact Sheet for Healthcare Providers: IncredibleEmployment.be  This test is not yet approved or cleared by the [place] FDA and has been authorized for detection and/or diagnosis of SARS-CoV-2 by FDA under an Emergency Use Authorization (EUA). This EUA will remain in effect (meaning this test can be used) for the duration of the COVID-19 declaration under Section 564(b)(1) of the Act, 21 U.S.C. section 360bbb-3(b)(1), unless the authorization is terminated or revoked.  Performed at [facility], [place] [address], [place], [place] [address]      Labs: BNP (last 3 results) No results for input(s): BNP in the last 8760 hours. Basic Metabolic Panel: Recent Labs  Lab 03/10/21 1028 03/11/21 0252 03/12/21 0411 03/14/21 0250  NA 138 141 138 139  K 4.1 3.8 4.1 3.9  CL 112* 114* 110 110  CO2 20* 21* 21* 21*  GLUCOSE 170* 96 141* 104*  BUN 37* 26* 17 12  CREATININE 1.14* 1.03* 1.32* 1.10*  CALCIUM 8.3* 8.2* 8.4* 8.6*  MG  --   --  2.1  --   PHOS  --   --  2.9  --    Liver Function Tests: Recent Labs  Lab 03/10/21 1028 03/12/21 0411  AST 9* 12*  ALT 11 10  ALKPHOS 42 43  BILITOT 0.8 0.9  PROT 5.3* 4.9*  ALBUMIN 2.8* 2.9*   No results for input(s): LIPASE, AMYLASE in the last 168 hours. No results for input(s): AMMONIA in the last 168 hours. CBC: Recent Labs  Lab 03/10/21 1028  03/10/21 2047 03/11/21 0252 03/11/21 2033 03/12/21 0411 03/12/21 1440 03/13/21 0203 03/14/21 0250  WBC 20.3*  --  16.1*  --  16.0*  --   --  11.7*  NEUTROABS 18.3*  --   --   --   --   --   --   --   HGB 5.7*   < > 7.8* 8.0* 7.7* 7.8* 7.9* 7.4*  HCT 19.1*   < > 24.7* 24.8* 23.7* 24.7* 25.3* 24.2*  MCV 96.0  --  92.9  --  91.5  --   --  94.9  PLT 523*  --  428*  --  452*  --   --  457*   < > = values in this interval not displayed.   Cardiac Enzymes: No results for input(s): CKTOTAL, CKMB, CKMBINDEX, TROPONINI in the last 168 hours. BNP: Invalid input(s): POCBNP CBG: Recent Labs  Lab 03/13/21 0632 03/13/21 1640 03/13/21 2150 03/14/21 0616 03/14/21 1128  GLUCAP 129* 100* 140* 119* 112*   D-Dimer No results for input(s): DDIMER in the last 72 hours. Hgb A1c No results for input(s): HGBA1C in the last 72 hours. Lipid Profile No results for input(s): CHOL, HDL, LDLCALC, TRIG, CHOLHDL, LDLDIRECT in the last 72 hours. Thyroid function studies No results for input(s): TSH, T4TOTAL, T3FREE, THYROIDAB in the last 72 hours.  Invalid input(s): FREET3 Anemia work up No results for input(s): VITAMINB12, FOLATE, FERRITIN, TIBC, IRON, RETICCTPCT in the last 72 hours. Urinalysis    Component Value Date/Time   COLORURINE YELLOW 02/24/2021 [place] 02/24/2021 3546  LABSPEC 1.010 02/24/2021 0833   PHURINE 5.0 02/24/2021 0833   GLUCOSEU NEGATIVE 02/24/2021 0833   HGBUR SMALL (A) 02/24/2021 0833   BILIRUBINUR NEGATIVE 02/24/2021 0833   KETONESUR NEGATIVE 02/24/2021 0833   PROTEINUR NEGATIVE 02/24/2021 0833   NITRITE NEGATIVE 02/24/2021 0833   LEUKOCYTESUR NEGATIVE 02/24/2021 0833   Sepsis Labs Invalid input(s): PROCALCITONIN,  WBC,  LACTICIDVEN Microbiology Recent Results (from the past 240 hour(s))  Resp Panel by RT-PCR (Flu A&B, Covid) Nasopharyngeal Swab     Status: None   Collection Time: 03/10/21 10:39 AM   Specimen: Nasopharyngeal Swab;  Nasopharyngeal(NP) swabs in vial transport medium  Result Value Ref Range Status   SARS Coronavirus 2 by RT PCR NEGATIVE NEGATIVE Final    Comment: (NOTE) SARS-CoV-2 target nucleic acids are NOT DETECTED.  The SARS-CoV-2 RNA is generally detectable in upper respiratory specimens during the acute phase of infection. The lowest concentration of SARS-CoV-2 viral copies this assay can detect is 138 copies/mL. A negative result does not preclude SARS-Cov-2 infection and should not be used as the sole basis for treatment or other patient management decisions. A negative result may occur with  improper specimen collection/handling, submission of specimen other than nasopharyngeal swab, presence of viral mutation(s) within the areas targeted by this assay, and inadequate number of viral copies(<138 copies/mL). A negative result must be combined with clinical observations, patient history, and epidemiological information. The expected result is Negative.  Fact Sheet for Patients:  EntrepreneurPulse.com.au  Fact Sheet for Healthcare Providers:  IncredibleEmployment.be  This test is no t yet approved or cleared by the [place] FDA and  has been authorized for detection and/or diagnosis of SARS-CoV-2 by FDA under an Emergency Use Authorization (EUA). This EUA will remain  in effect (meaning this test can be used) for the duration of the COVID-19 declaration under Section 564(b)(1) of the Act, 21 U.S.C.section 360bbb-3(b)(1), unless the authorization is terminated  or revoked sooner.       Influenza A by PCR NEGATIVE NEGATIVE Final   Influenza B by PCR NEGATIVE NEGATIVE Final    Comment: (NOTE) The Xpert Xpress SARS-CoV-2/FLU/RSV plus assay is intended as an aid in the diagnosis of influenza from Nasopharyngeal swab specimens and should not be used as a sole basis for treatment. Nasal washings and aspirates are unacceptable for Xpert Xpress  SARS-CoV-2/FLU/RSV testing.  Fact Sheet for Patients: EntrepreneurPulse.com.au  Fact Sheet for Healthcare Providers: IncredibleEmployment.be  This test is not yet approved or cleared by the [place] FDA and has been authorized for detection and/or diagnosis of SARS-CoV-2 by FDA under an Emergency Use Authorization (EUA). This EUA will remain in effect (meaning this test can be used) for the duration of the COVID-19 declaration under Section 564(b)(1) of the Act, 21 U.S.C. section 360bbb-3(b)(1), unless the authorization is terminated or revoked.  Performed at [facility], [place] [address] [place], [place] [address]      Time coordinating discharge in minutes: 65  SIGNED:   Debbe Odea, MD  [facility] 03/14/2021, 4:20 PM

## 2021-03-17 DIAGNOSIS — N183 Chronic kidney disease, stage 3 unspecified: Secondary | ICD-10-CM | POA: Diagnosis not present

## 2021-03-17 DIAGNOSIS — Z7982 Long term (current) use of aspirin: Secondary | ICD-10-CM | POA: Diagnosis not present

## 2021-03-17 DIAGNOSIS — Z8701 Personal history of pneumonia (recurrent): Secondary | ICD-10-CM | POA: Diagnosis not present

## 2021-03-17 DIAGNOSIS — I081 Rheumatic disorders of both mitral and tricuspid valves: Secondary | ICD-10-CM | POA: Diagnosis not present

## 2021-03-17 DIAGNOSIS — Z7984 Long term (current) use of oral hypoglycemic drugs: Secondary | ICD-10-CM | POA: Diagnosis not present

## 2021-03-17 DIAGNOSIS — Z79899 Other long term (current) drug therapy: Secondary | ICD-10-CM | POA: Diagnosis not present

## 2021-03-17 DIAGNOSIS — I5022 Chronic systolic (congestive) heart failure: Secondary | ICD-10-CM | POA: Diagnosis not present

## 2021-03-17 DIAGNOSIS — E1151 Type 2 diabetes mellitus with diabetic peripheral angiopathy without gangrene: Secondary | ICD-10-CM | POA: Diagnosis not present

## 2021-03-17 DIAGNOSIS — E039 Hypothyroidism, unspecified: Secondary | ICD-10-CM | POA: Diagnosis not present

## 2021-03-17 DIAGNOSIS — C9210 Chronic myeloid leukemia, BCR/ABL-positive, not having achieved remission: Secondary | ICD-10-CM | POA: Diagnosis not present

## 2021-03-17 DIAGNOSIS — I428 Other cardiomyopathies: Secondary | ICD-10-CM | POA: Diagnosis not present

## 2021-03-17 DIAGNOSIS — I482 Chronic atrial fibrillation, unspecified: Secondary | ICD-10-CM | POA: Diagnosis not present

## 2021-03-17 DIAGNOSIS — Z87891 Personal history of nicotine dependence: Secondary | ICD-10-CM | POA: Diagnosis not present

## 2021-03-17 DIAGNOSIS — I130 Hypertensive heart and chronic kidney disease with heart failure and stage 1 through stage 4 chronic kidney disease, or unspecified chronic kidney disease: Secondary | ICD-10-CM | POA: Diagnosis not present

## 2021-03-17 DIAGNOSIS — Z9181 History of falling: Secondary | ICD-10-CM | POA: Diagnosis not present

## 2021-03-17 DIAGNOSIS — E1122 Type 2 diabetes mellitus with diabetic chronic kidney disease: Secondary | ICD-10-CM | POA: Diagnosis not present

## 2021-03-17 DIAGNOSIS — T82898D Other specified complication of vascular prosthetic devices, implants and grafts, subsequent encounter: Secondary | ICD-10-CM | POA: Diagnosis not present

## 2021-03-17 DIAGNOSIS — L89626 Pressure-induced deep tissue damage of left heel: Secondary | ICD-10-CM | POA: Diagnosis not present

## 2021-03-18 DIAGNOSIS — E890 Postprocedural hypothyroidism: Secondary | ICD-10-CM | POA: Diagnosis not present

## 2021-03-18 DIAGNOSIS — I739 Peripheral vascular disease, unspecified: Secondary | ICD-10-CM | POA: Diagnosis not present

## 2021-03-18 DIAGNOSIS — D649 Anemia, unspecified: Secondary | ICD-10-CM | POA: Diagnosis not present

## 2021-03-18 DIAGNOSIS — Z6827 Body mass index (BMI) 27.0-27.9, adult: Secondary | ICD-10-CM | POA: Diagnosis not present

## 2021-03-18 DIAGNOSIS — E119 Type 2 diabetes mellitus without complications: Secondary | ICD-10-CM | POA: Diagnosis not present

## 2021-03-18 DIAGNOSIS — Z79899 Other long term (current) drug therapy: Secondary | ICD-10-CM | POA: Diagnosis not present

## 2021-03-18 DIAGNOSIS — L97429 Non-pressure chronic ulcer of left heel and midfoot with unspecified severity: Secondary | ICD-10-CM | POA: Diagnosis not present

## 2021-03-18 DIAGNOSIS — C9110 Chronic lymphocytic leukemia of B-cell type not having achieved remission: Secondary | ICD-10-CM | POA: Diagnosis not present

## 2021-03-18 DIAGNOSIS — I4891 Unspecified atrial fibrillation: Secondary | ICD-10-CM | POA: Diagnosis not present

## 2021-03-18 DIAGNOSIS — I509 Heart failure, unspecified: Secondary | ICD-10-CM | POA: Diagnosis not present

## 2021-03-18 DIAGNOSIS — K31819 Angiodysplasia of stomach and duodenum without bleeding: Secondary | ICD-10-CM | POA: Diagnosis not present

## 2021-03-18 DIAGNOSIS — N184 Chronic kidney disease, stage 4 (severe): Secondary | ICD-10-CM | POA: Diagnosis not present

## 2021-03-25 ENCOUNTER — Encounter: Payer: Self-pay | Admitting: Vascular Surgery

## 2021-03-25 ENCOUNTER — Other Ambulatory Visit: Payer: Self-pay

## 2021-03-25 ENCOUNTER — Ambulatory Visit (INDEPENDENT_AMBULATORY_CARE_PROVIDER_SITE_OTHER): Payer: PPO | Admitting: Vascular Surgery

## 2021-03-25 VITALS — BP 126/70 | HR 71 | Temp 97.3°F | Resp 14 | Ht 68.0 in | Wt 171.0 lb

## 2021-03-25 DIAGNOSIS — I70229 Atherosclerosis of native arteries of extremities with rest pain, unspecified extremity: Secondary | ICD-10-CM

## 2021-03-25 NOTE — Progress Notes (Signed)
Patient name: Sara Warner MRN: [id number] DOB: 1941-06-24 Sex: female  REASON FOR VISIT: Postop check after recent redo left common femoral endarterectomy with a left common femoral to below-knee popliteal artery bypass  HPI: Sara Warner is a 80 y.o. female multiple medical comorbidities as noted below who presents for postop check.  Most recently she underwent redo left common femoral endarterectomy with a left common femoral to BK popliteal bypass with PTFE on 02/24/2021 for an occluded left common femoral to above-knee popliteal bypass.  She is doing well overall.  All of her incisions have healed including the groin wound.  She does have a heel wound that is improving.  Foot feels a lot better.  She is well-known to vascular surgery.  On 12/06/2019 she had a right external iliac stent and then on 12/15/2019 had a right common femoral to below-knee pop bypass with vein for CLI with tissue loss.  Then on 06/20/2020 she had left common femoral endarterectomy with bovine patch and a left common femoral to above-knee pop bypass also with vein.  Past Medical History:  Diagnosis Date  . Atrial fibrillation ([place])   . Chronic atrial fibrillation ([place])    a. Refuses [place].  CHA2DS2VASc = 4.  . Chronic systolic CHF (congestive heart failure) ([place])    a. 05/2015 Echo: EF 35-40%.  . CKD (chronic kidney disease), stage III ([place])   . 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 DM (diabetes mellitus) ([place])   . Dysrhythmia   . Elevated WBC count   . Epistaxis 04/03/2020   AFTER COVID SWAB  . Hypertension   . Hypertensive heart disease   . Hypothyroidism   . NICM (nonischemic cardiomyopathy) ([place])    a. 06/2013 MV: low risk study w/o ischemia;  b. 05/2015 Echo: EF 35-40%, diff HK, basal-midinferoseptal and basal-midanteroseptal AK. Mild-mod MR, mod dil LA, mild to mod TR, PASP 66mmHg.  Marland Kitchen PAD (peripheral artery disease) ([place])   . Pneumonia    30 yrs. ago  . Renal artery stenosis ([facility])     s/p left renal artery stent 02/18/21    Past Surgical History:  Procedure Laterality Date  . ABDOMINAL AORTOGRAM W/LOWER EXTREMITY Bilateral 12/06/2019   Procedure: ABDOMINAL AORTOGRAM W/LOWER EXTREMITY;  Surgeon: [name], MD;  Location: [place] [facility];  Service: Cardiovascular;  Laterality: Bilateral;  . ABDOMINAL AORTOGRAM W/LOWER EXTREMITY Bilateral 05/15/2020   Procedure: ABDOMINAL AORTOGRAM W/LOWER EXTREMITY;  Surgeon: [name], MD;  Location: [place] [facility];  Service: Cardiovascular;  Laterality: Bilateral;  . ABDOMINAL AORTOGRAM W/LOWER EXTREMITY N/A 02/18/2021   Procedure: ABDOMINAL AORTOGRAM W/LOWER EXTREMITY;  Surgeon: Serafina Mitchell, MD;  Location: [place] [facility];  Service: Cardiovascular;  Laterality: N/A;  . ABDOMINAL HYSTERECTOMY    . APPLICATION OF WOUND VAC  01/10/2020   BLEEDING FROM FEMORAL BYPASS  . BIOPSY  04/08/2020   Procedure: BIOPSY;  Surgeon: Jackquline Denmark, MD;  Location: [facility] ENDOSCOPY;  Service: Endoscopy;;  . CARDIOVERSION    . COLONOSCOPY WITH PROPOFOL N/A 04/08/2020   Procedure: COLONOSCOPY WITH PROPOFOL;  Surgeon: Jackquline Denmark, MD;  Location: [facility] ENDOSCOPY;  Service: Endoscopy;  Laterality: N/A;  . CYST EXCISION     removed on right ovary  . CYSTECTOMY    . ENDARTERECTOMY FEMORAL Left 06/20/2020   Procedure: LEFT COMMON FEMORAL ENDARTERECTOMY LEFT ABOVE KNEE POPLITEAL ENDARTERECTOMY;  Surgeon: [name], MD;  Location: [place];  Service: Vascular;  Laterality: Left;  . ENDARTERECTOMY FEMORAL Left 02/24/2021  Procedure: REDO LEFT COMMON FEMORAL ENDARTERECTOMY AND PROFUNDAPLASTY WTH BOVINE PATCH;  Surgeon: [name], MD;  Location: [place];  Service: Vascular;  Laterality: Left;  . ENTEROSCOPY N/A 03/13/2021   Procedure: ENTEROSCOPY;  Surgeon: Jerene Bears, MD;  Location: [facility] ENDOSCOPY;  Service: Gastroenterology;  Laterality: N/A;  . ESOPHAGOGASTRODUODENOSCOPY (EGD) WITH PROPOFOL N/A 04/08/2020   Procedure:  ESOPHAGOGASTRODUODENOSCOPY (EGD) WITH PROPOFOL;  Surgeon: Jackquline Denmark, MD;  Location: [facility] ENDOSCOPY;  Service: Endoscopy;  Laterality: N/A;  . ESOPHAGOGASTRODUODENOSCOPY (EGD) WITH PROPOFOL N/A 03/11/2021   Procedure: ESOPHAGOGASTRODUODENOSCOPY (EGD) WITH PROPOFOL;  Surgeon: Jerene Bears, MD;  Location: [facility] ENDOSCOPY;  Service: Gastroenterology;  Laterality: N/A;  . FEMORAL ARTERY - POPLITEAL ARTERY BYPASS GRAFT  12/15/2019   Right common femoral artery to below-knee popliteal artery bypass with ipsilateral nonreversed great saphenous vein  . FEMORAL-POPLITEAL BYPASS GRAFT Right 12/15/2019   Procedure: BYPASS GRAFT FEMORAL-POPLITEAL ARTERY;  Surgeon: [name], MD;  Location: [place];  Service: Vascular;  Laterality: Right;  . FEMORAL-POPLITEAL BYPASS GRAFT Left 06/20/2020   Procedure: BYPASS GRAFT FEMORAL TO ABOVE KNEE POPLITEAL ARTERY WITH HARVENSTED NON REVERSED GREATER SAPHENOUS VEIN;  Surgeon: [name], MD;  Location: [place];  Service: Vascular;  Laterality: Left;  . FEMORAL-POPLITEAL BYPASS GRAFT Left 02/24/2021   Procedure: REDO LEFT COMMON FEMORAL ARTERY EXPOSURE AND LEFT COMMON FEMORAL ARTERY TO BELOW KNEE POPLITEAL ARTERY BYPASS GRAFT WITH PROPATEN VASCULAR GRAFT;  Surgeon: [name], MD;  Location: [place];  Service: Vascular;  Laterality: Left;  . GIVENS CAPSULE STUDY N/A 03/11/2021   Procedure: GIVENS CAPSULE STUDY;  Surgeon: Jerene Bears, MD;  Location: [place];  Service: Gastroenterology;  Laterality: N/A;  . GIVENS CAPSULE STUDY N/A 03/13/2021   Procedure: GIVENS CAPSULE STUDY;  Surgeon: Jerene Bears, MD;  Location: [place];  Service: Gastroenterology;  Laterality: N/A;  . HOT HEMOSTASIS N/A 03/11/2021   Procedure: HOT HEMOSTASIS (ARGON PLASMA COAGULATION/BICAP);  Surgeon: Jerene Bears, MD;  Location: [facility] ENDOSCOPY;  Service: Gastroenterology;  Laterality: N/A;  . HYSTEROTOMY    . PERIPHERAL VASCULAR INTERVENTION Right 12/06/2019   Procedure:  PERIPHERAL VASCULAR INTERVENTION;  Surgeon: [name], MD;  Location: [place] [facility];  Service: Cardiovascular;  Laterality: Right;  EXT ILIAC  . PERIPHERAL VASCULAR INTERVENTION Left 02/18/2021   Procedure: PERIPHERAL VASCULAR INTERVENTION;  Surgeon: Serafina Mitchell, MD;  Location: [place] [facility];  Service: Cardiovascular;  Laterality: Left;  renal artery  . POLYPECTOMY  04/08/2020   Procedure: POLYPECTOMY;  Surgeon: Jackquline Denmark, MD;  Location: [facility] ENDOSCOPY;  Service: Endoscopy;;  . WOUND EXPLORATION Right 01/10/2020   Procedure: right lower leg Wound Exploration with wound vac application;  Surgeon: Serafina Mitchell, MD;  Location: [facility] OR;  Service: Vascular;  Laterality: Right;    Family History  Problem Relation Age of Onset  . Cancer - Colon Mother        mast to lungs  . Diabetes Father   . Diabetes Paternal Grandmother     SOCIAL HISTORY: Social History   Tobacco Use  . Smoking status: Former Smoker    Types: Cigarettes    Quit date: 01/01/1984    Years since quitting: 37.2  . Smokeless tobacco: Never Used  Substance Use Topics  . Alcohol use: No    Allergies  Allergen Reactions  . Lisinopril Cough  . Losartan Other (See Comments)    Patient reports nightmares/vivid dreams when taking.    Current Outpatient Medications  Medication  Sig Dispense Refill  . cetirizine (ZYRTEC) 10 MG tablet Take 10 mg by mouth daily.    . clorazepate (TRANXENE-T) 7.5 MG tablet Take 1 tablet (7.5 mg total) by mouth 2 (two) times daily as needed for anxiety. 30 tablet 1  . digoxin (LANOXIN) 0.125 MG tablet Take 0.5 tablets (0.0625 mg total) by mouth every other day. 45 tablet 1  . Dulaglutide 3 MG/0.5ML SOPN Inject 3 mg into the skin once a week.    . furosemide (LASIX) 20 MG tablet TAKE 2 TABLETS BY MOUTH IN THE MORNING AND AN EXTRA TABLET IF GAIN 3 OR MORE POUNDS IN 1 DAY (Patient taking differently: Take 20-40 mg by mouth See admin instructions. Take 40 mg in the morning,  may take an additional 20 mg as needed for weight gain of 3lbs or more in one day) 270 tablet 1  . glipiZIDE (GLUCOTROL XL) 10 MG 24 hr tablet Take 10 mg by mouth 2 (two) times daily.     . iron polysaccharides (NIFEREX) 150 MG capsule Take 150 mg by mouth daily.    Marland Kitchen levothyroxine (SYNTHROID) 125 MCG tablet Take 125 mcg by mouth daily before breakfast.    . metoprolol (TOPROL-XL) 200 MG 24 hr tablet TAKE 1 TABLET BY MOUTH EVERY DAY (Patient taking differently: Take 200 mg by mouth daily.) 90 tablet 3  . nilotinib (TASIGNA) 150 MG capsule Take 2 capsules (300 mg total) by mouth every 12 (twelve) hours. Take on an empty stomach, 1 hour before or 2 hours after meals. 120 capsule 0  . pantoprazole (PROTONIX) 40 MG tablet Take 40 mg by mouth daily.    . rosuvastatin (CRESTOR) 5 MG tablet Take 2 tablets (10 mg total) by mouth daily. 30 tablet 11  . sitaGLIPtin (JANUVIA) 50 MG tablet Take 50 mg by mouth daily as needed (For diabeties).    . sucralfate (CARAFATE) 1 g tablet Take 1 g by mouth 4 (four) times daily.    Marland Kitchen oxyCODONE (ROXICODONE) 5 MG immediate release tablet Take 1 tablet (5 mg total) by mouth every 8 (eight) hours as needed. (Patient not taking: Reported on 03/25/2021) 20 tablet 0  . spironolactone (ALDACTONE) 25 MG tablet Take 0.5 tablets (12.5 mg total) by mouth 2 (two) times daily. (Patient taking differently: Take 25 mg by mouth every evening.) 90 tablet 3   No current facility-administered medications for this visit.    REVIEW OF SYSTEMS:  [X]  denotes positive finding, [ ]  denotes negative finding Cardiac  Comments:  Chest pain or chest pressure:    Shortness of breath upon exertion:    Short of breath when lying flat:    Irregular heart rhythm:        Vascular    Pain in calf, thigh, or hip brought on by ambulation:    Pain in feet at night that wakes you up from your sleep:     Blood clot in your veins:    Leg swelling:         Pulmonary    Oxygen at home:    Productive  cough:     Wheezing:         Neurologic    Sudden weakness in arms or legs:     Sudden numbness in arms or legs:     Sudden onset of difficulty speaking or slurred speech:    Temporary loss of vision in one eye:     Problems with dizziness:  Gastrointestinal    Blood in stool:     Vomited blood:         Genitourinary    Burning when urinating:     Blood in urine:        Psychiatric    Major depression:         Hematologic    Bleeding problems:    Problems with blood clotting too easily:        Skin    Rashes or ulcers:        Constitutional    Fever or chills:      PHYSICAL EXAM: Vitals:   03/25/21 1419  BP: 126/70  Pulse: 71  Resp: 14  Temp: (!) 97.3 F (36.3 C)  TempSrc: Temporal  SpO2: 96%  Weight: 171 lb (77.6 kg)  Height: 5\' 8"  (1.727 m)    GENERAL: The patient is a well-nourished female, in no acute distress. The vital signs are documented above. CARDIAC: There is a regular rate and rhythm.  VASCULAR:  Left groin incision healed Left femoral pulse palpable Left below-knee pop incision healed Left DP palpable Right DP palpable       DATA:   None  Assessment/Plan:  80 yo F doing well status post redo left common femoral endarterectomy and left common femoral to BK pop bypass PTFE on 02/24/2021 (for on occluded left common femoral artery to AK popliteal artery bypass).  She has a palpable DP pulse on exam in the left foot.  Discussed I will have her follow-up in 1 month in the [facility] clinic for wound check of her left heel.  As long as this continues to improve likely will need 43-month follow-up for surveillance of bilateral lower extremity bypasses.  She is now off of her aspirin and all anticoagulation given recent GI bleed requiring hospital admission and profound anemia.     [name], MD [facility] Office: [phone]

## 2021-03-26 DIAGNOSIS — Z9181 History of falling: Secondary | ICD-10-CM | POA: Diagnosis not present

## 2021-03-26 DIAGNOSIS — E1122 Type 2 diabetes mellitus with diabetic chronic kidney disease: Secondary | ICD-10-CM | POA: Diagnosis not present

## 2021-03-26 DIAGNOSIS — I130 Hypertensive heart and chronic kidney disease with heart failure and stage 1 through stage 4 chronic kidney disease, or unspecified chronic kidney disease: Secondary | ICD-10-CM | POA: Diagnosis not present

## 2021-03-26 DIAGNOSIS — I428 Other cardiomyopathies: Secondary | ICD-10-CM | POA: Diagnosis not present

## 2021-03-26 DIAGNOSIS — E039 Hypothyroidism, unspecified: Secondary | ICD-10-CM | POA: Diagnosis not present

## 2021-03-26 DIAGNOSIS — Z87891 Personal history of nicotine dependence: Secondary | ICD-10-CM | POA: Diagnosis not present

## 2021-03-26 DIAGNOSIS — N183 Chronic kidney disease, stage 3 unspecified: Secondary | ICD-10-CM | POA: Diagnosis not present

## 2021-03-26 DIAGNOSIS — I081 Rheumatic disorders of both mitral and tricuspid valves: Secondary | ICD-10-CM | POA: Diagnosis not present

## 2021-03-26 DIAGNOSIS — C9210 Chronic myeloid leukemia, BCR/ABL-positive, not having achieved remission: Secondary | ICD-10-CM | POA: Diagnosis not present

## 2021-03-26 DIAGNOSIS — T82898D Other specified complication of vascular prosthetic devices, implants and grafts, subsequent encounter: Secondary | ICD-10-CM | POA: Diagnosis not present

## 2021-03-26 DIAGNOSIS — Z7982 Long term (current) use of aspirin: Secondary | ICD-10-CM | POA: Diagnosis not present

## 2021-03-26 DIAGNOSIS — Z8701 Personal history of pneumonia (recurrent): Secondary | ICD-10-CM | POA: Diagnosis not present

## 2021-03-26 DIAGNOSIS — Z79899 Other long term (current) drug therapy: Secondary | ICD-10-CM | POA: Diagnosis not present

## 2021-03-26 DIAGNOSIS — I5022 Chronic systolic (congestive) heart failure: Secondary | ICD-10-CM | POA: Diagnosis not present

## 2021-03-26 DIAGNOSIS — E1151 Type 2 diabetes mellitus with diabetic peripheral angiopathy without gangrene: Secondary | ICD-10-CM | POA: Diagnosis not present

## 2021-03-26 DIAGNOSIS — L89626 Pressure-induced deep tissue damage of left heel: Secondary | ICD-10-CM | POA: Diagnosis not present

## 2021-03-26 DIAGNOSIS — Z7984 Long term (current) use of oral hypoglycemic drugs: Secondary | ICD-10-CM | POA: Diagnosis not present

## 2021-03-26 DIAGNOSIS — I482 Chronic atrial fibrillation, unspecified: Secondary | ICD-10-CM | POA: Diagnosis not present

## 2021-03-27 ENCOUNTER — Inpatient Hospital Stay: Payer: PPO | Attending: Oncology

## 2021-03-27 ENCOUNTER — Other Ambulatory Visit: Payer: Self-pay

## 2021-03-27 ENCOUNTER — Inpatient Hospital Stay: Payer: PPO | Admitting: Oncology

## 2021-03-27 VITALS — BP 126/47 | HR 87 | Temp 97.6°F | Resp 18 | Ht 68.0 in | Wt 175.6 lb

## 2021-03-27 DIAGNOSIS — C9210 Chronic myeloid leukemia, BCR/ABL-positive, not having achieved remission: Secondary | ICD-10-CM

## 2021-03-27 DIAGNOSIS — C9211 Chronic myeloid leukemia, BCR/ABL-positive, in remission: Secondary | ICD-10-CM | POA: Insufficient documentation

## 2021-03-27 DIAGNOSIS — Z79899 Other long term (current) drug therapy: Secondary | ICD-10-CM | POA: Insufficient documentation

## 2021-03-27 DIAGNOSIS — Z7984 Long term (current) use of oral hypoglycemic drugs: Secondary | ICD-10-CM | POA: Diagnosis not present

## 2021-03-27 DIAGNOSIS — Q2739 Arteriovenous malformation, other site: Secondary | ICD-10-CM | POA: Insufficient documentation

## 2021-03-27 DIAGNOSIS — D500 Iron deficiency anemia secondary to blood loss (chronic): Secondary | ICD-10-CM | POA: Diagnosis not present

## 2021-03-27 DIAGNOSIS — D509 Iron deficiency anemia, unspecified: Secondary | ICD-10-CM

## 2021-03-27 LAB — CMP (CANCER CENTER ONLY)
ALT: <6 U/L (ref 0–44)
AST: 7 U/L — ABNORMAL LOW (ref 15–41)
Albumin: 3.7 g/dL (ref 3.5–5.0)
Alkaline Phosphatase: 52 U/L (ref 38–126)
Anion gap: 10 (ref 5–15)
BUN: 25 mg/dL — ABNORMAL HIGH (ref 8–23)
CO2: 25 mmol/L (ref 22–32)
Calcium: 8.7 mg/dL — ABNORMAL LOW (ref 8.9–10.3)
Chloride: 108 mmol/L (ref 98–111)
Creatinine: 1.23 mg/dL — ABNORMAL HIGH (ref 0.44–1.00)
GFR, Estimated: 45 mL/min — ABNORMAL LOW (ref >60)
Glucose, Bld: 181 mg/dL — ABNORMAL HIGH (ref 70–99)
Potassium: 3.5 mmol/L (ref 3.5–5.1)
Sodium: 143 mmol/L (ref 135–145)
Total Bilirubin: 0.8 mg/dL (ref 0.3–1.2)
Total Protein: 6 g/dL — ABNORMAL LOW (ref 6.5–8.1)

## 2021-03-27 LAB — CBC WITH DIFFERENTIAL (CANCER CENTER ONLY)
Abs Immature Granulocytes: 0.13 10*3/uL — ABNORMAL HIGH (ref 0.00–0.07)
Basophils Absolute: 0.1 10*3/uL (ref 0.0–0.1)
Basophils Relative: 1 %
Eosinophils Absolute: 0.2 10*3/uL (ref 0.0–0.5)
Eosinophils Relative: 2 %
HCT: 27.3 % — ABNORMAL LOW (ref 36.0–46.0)
Hemoglobin: 8.4 g/dL — ABNORMAL LOW (ref 12.0–15.0)
Immature Granulocytes: 1 %
Lymphocytes Relative: 16 %
Lymphs Abs: 2 10*3/uL (ref 0.7–4.0)
MCH: 26.6 pg (ref 26.0–34.0)
MCHC: 30.8 g/dL (ref 30.0–36.0)
MCV: 86.4 fL (ref 80.0–100.0)
Monocytes Absolute: 0.8 10*3/uL (ref 0.1–1.0)
Monocytes Relative: 7 %
Neutro Abs: 9.4 10*3/uL — ABNORMAL HIGH (ref 1.7–7.7)
Neutrophils Relative %: 73 %
Platelet Count: 420 10*3/uL — ABNORMAL HIGH (ref 150–400)
RBC: 3.16 MIL/uL — ABNORMAL LOW (ref 3.87–5.11)
RDW: 16.6 % — ABNORMAL HIGH (ref 11.5–15.5)
WBC Count: 12.7 10*3/uL — ABNORMAL HIGH (ref 4.0–10.5)
nRBC: 0 % (ref 0.0–0.2)

## 2021-03-27 LAB — IRON AND TIBC
Iron: 24 ug/dL — ABNORMAL LOW (ref 41–142)
Saturation Ratios: 7 % — ABNORMAL LOW (ref 21–57)
TIBC: 360 ug/dL (ref 236–444)
UIBC: 336 ug/dL (ref 120–384)

## 2021-03-27 LAB — FERRITIN: Ferritin: 17 ng/mL (ref 11–307)

## 2021-03-27 NOTE — Progress Notes (Signed)
Hematology and Oncology Follow Up Visit  Sara Warner [id number] [birth year] 80 y.o. 03/27/2021 9:58 AM   Principle Diagnosis: 80 year old woman with chronic phase CML diagnosed in 2013.    Current treatment: Tasigna 300 mg bid started 11/2012 with complete hematological and molecular remission.  Interim History: Sara Warner returns today for repeat evaluation.  Since her last visit, she was hospitalized between April 11 and April 15 of this year with GI bleeding.  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  Anticoagulation has been discontinued.  She also has been on healing well for at her ankle and was evaluated by Dr. Carlis Abbott from vascular surgery.   Clinically, she reports gradual improvement in her health and performance status.  She is taking oral iron replacement without any excessive fatigue or tiredness.  She denies any hematochezia, melena or hemoptysis.  She has resumed Tasigna  at this time.     Medications: Updated on review. Current Outpatient Medications  Medication Sig Dispense Refill  . cetirizine (ZYRTEC) 10 MG tablet Take 10 mg by mouth daily.    . clorazepate (TRANXENE-T) 7.5 MG tablet Take 1 tablet (7.5 mg total) by mouth 2 (two) times daily as needed for anxiety. 30 tablet 1  . digoxin (LANOXIN) 0.125 MG tablet Take 0.5 tablets (0.0625 mg total) by mouth every other day. 45 tablet 1  . Dulaglutide 3 MG/0.5ML SOPN Inject 3 mg into the skin once a week.    . furosemide (LASIX) 20 MG tablet TAKE 2 TABLETS BY MOUTH IN THE MORNING AND AN EXTRA TABLET IF GAIN 3 OR MORE POUNDS IN 1 DAY (Patient taking differently: Take 20-40 mg by mouth See admin instructions. Take 40 mg in the morning, may take an additional 20 mg as needed for weight gain of 3lbs or more in one day) 270 tablet 1  . glipiZIDE (GLUCOTROL XL) 10 MG 24 hr tablet Take 10 mg by mouth 2 (two) times daily.     . iron polysaccharides (NIFEREX) 150 MG  capsule Take 150 mg by mouth daily.    Marland Kitchen levothyroxine (SYNTHROID) 125 MCG tablet Take 125 mcg by mouth daily before breakfast.    . metoprolol (TOPROL-XL) 200 MG 24 hr tablet TAKE 1 TABLET BY MOUTH EVERY DAY (Patient taking differently: Take 200 mg by mouth daily.) 90 tablet 3  . nilotinib (TASIGNA) 150 MG capsule Take 2 capsules (300 mg total) by mouth every 12 (twelve) hours. Take on an empty stomach, 1 hour before or 2 hours after meals. 120 capsule 0  . oxyCODONE (ROXICODONE) 5 MG immediate release tablet Take 1 tablet (5 mg total) by mouth every 8 (eight) hours as needed. (Patient not taking: Reported on 03/25/2021) 20 tablet 0  . pantoprazole (PROTONIX) 40 MG tablet Take 40 mg by mouth daily.    . rosuvastatin (CRESTOR) 5 MG tablet Take 2 tablets (10 mg total) by mouth daily. 30 tablet 11  . sitaGLIPtin (JANUVIA) 50 MG tablet Take 50 mg by mouth daily as needed (For diabeties).    Marland Kitchen spironolactone (ALDACTONE) 25 MG tablet Take 0.5 tablets (12.5 mg total) by mouth 2 (two) times daily. (Patient taking differently: Take 25 mg by mouth every evening.) 90 tablet 3  . sucralfate (CARAFATE) 1 g tablet Take 1 g by mouth 4 (four) times daily.     No current facility-administered medications for this visit.    Allergies:  Allergies  Allergen Reactions  . Lisinopril Cough  .  Losartan Other (See Comments)    Patient reports nightmares/vivid dreams when taking.       Physical Exam: Blood pressure (!) 126/47, pulse 87, temperature 97.6 F (36.4 C), temperature source Tympanic, resp. rate 18, height 5' 8"  (1.727 m), weight 175 lb 9.6 oz (79.7 kg), SpO2 100 %.     ECOG: 1     General appearance: Alert, awake without any distress. Head: Atraumatic without abnormalities Oropharynx: Without any thrush or ulcers. Eyes: No scleral icterus. Lymph nodes: No lymphadenopathy noted in the cervical, supraclavicular, or axillary nodes Heart:regular rate and rhythm, without any murmurs or gallops.    Lung: Clear to auscultation without any rhonchi, wheezes or dullness to percussion. Abdomin: Soft, nontender without any shifting dullness or ascites. Musculoskeletal: No clubbing or cyanosis. Neurological: No motor or sensory deficits. Skin: No rashes or lesions.        Lab Results: Lab Results  Component Value Date   WBC 11.7 (H) 03/14/2021   HGB 7.4 (L) 03/14/2021   HCT 24.2 (L) 03/14/2021   MCV 94.9 03/14/2021   PLT 457 (H) 03/14/2021     Chemistry      Component Value Date/Time   NA 139 03/14/2021 0250   NA 140 03/22/2018 0957   NA 140 09/08/2017 1417   K 3.9 03/14/2021 0250   K 4.0 09/08/2017 1417   CL 110 03/14/2021 0250   CL 106 04/26/2013 1252   CO2 21 (L) 03/14/2021 0250   CO2 24 09/08/2017 1417   BUN 12 03/14/2021 0250   BUN 28 (H) 03/22/2018 0957   BUN 26.9 (H) 09/08/2017 1417   CREATININE 1.10 (H) 03/14/2021 0250   CREATININE 1.93 (H) 09/25/2020 1147   CREATININE 1.4 (H) 09/08/2017 1417      Component Value Date/Time   CALCIUM 8.6 (L) 03/14/2021 0250   CALCIUM 9.5 09/08/2017 1417   ALKPHOS 43 03/12/2021 0411   ALKPHOS 44 09/08/2017 1417   AST 12 (L) 03/12/2021 0411   AST 13 (L) 09/25/2020 1147   AST 19 09/08/2017 1417   ALT 10 03/12/2021 0411   ALT 15 09/25/2020 1147   ALT 23 09/08/2017 1417   BILITOT 0.9 03/12/2021 0411   BILITOT 1.2 09/25/2020 1147   BILITOT 0.65 09/08/2017 1417         Impression and Plan:   80 year old woman with:   1.  CML presented with leukocytosis diagnosed in 2013.  Bone marrow biopsy confirmed the presence of chronic phase CML.  She has been on Tasigna with complete hematological response and no evidence of peripheral blood molecular relapse.  Risks and benefits of continuing this medication was discussed.  The role for therapy discontinuation was discussed especially in the setting of peripheral vascular disease versus alternative treatment were discussed.  After discussion today, I recommended and  treatment break for short period of time to allow for overall recovery and we will recheck her counts in 3 months.   2.  Iron deficiency anemia: Related to chronic GI blood losses.  I recommended oral iron replacement.   3.  Peripheral vascular disease: This could be exacerbated by Tasigna.  She continues to follow with wound care and vascular surgery.  4. Follow-up: In 3 months for repeat evaluation.  30  minutes were dedicated to this visit.  The time was spent on reviewing laboratory data, disease status update and treatment options moving forward.   Zola Button 4/28/20229:58 AM

## 2021-03-29 ENCOUNTER — Other Ambulatory Visit: Payer: Self-pay | Admitting: Cardiovascular Disease

## 2021-03-31 DIAGNOSIS — I130 Hypertensive heart and chronic kidney disease with heart failure and stage 1 through stage 4 chronic kidney disease, or unspecified chronic kidney disease: Secondary | ICD-10-CM | POA: Diagnosis not present

## 2021-03-31 DIAGNOSIS — I482 Chronic atrial fibrillation, unspecified: Secondary | ICD-10-CM | POA: Diagnosis not present

## 2021-03-31 DIAGNOSIS — N183 Chronic kidney disease, stage 3 unspecified: Secondary | ICD-10-CM | POA: Diagnosis not present

## 2021-03-31 DIAGNOSIS — I081 Rheumatic disorders of both mitral and tricuspid valves: Secondary | ICD-10-CM | POA: Diagnosis not present

## 2021-03-31 DIAGNOSIS — I428 Other cardiomyopathies: Secondary | ICD-10-CM | POA: Diagnosis not present

## 2021-03-31 DIAGNOSIS — Z87891 Personal history of nicotine dependence: Secondary | ICD-10-CM | POA: Diagnosis not present

## 2021-03-31 DIAGNOSIS — Z9181 History of falling: Secondary | ICD-10-CM | POA: Diagnosis not present

## 2021-03-31 DIAGNOSIS — Z7984 Long term (current) use of oral hypoglycemic drugs: Secondary | ICD-10-CM | POA: Diagnosis not present

## 2021-03-31 DIAGNOSIS — Z79899 Other long term (current) drug therapy: Secondary | ICD-10-CM | POA: Diagnosis not present

## 2021-03-31 DIAGNOSIS — C9210 Chronic myeloid leukemia, BCR/ABL-positive, not having achieved remission: Secondary | ICD-10-CM | POA: Diagnosis not present

## 2021-03-31 DIAGNOSIS — E1122 Type 2 diabetes mellitus with diabetic chronic kidney disease: Secondary | ICD-10-CM | POA: Diagnosis not present

## 2021-03-31 DIAGNOSIS — T82898D Other specified complication of vascular prosthetic devices, implants and grafts, subsequent encounter: Secondary | ICD-10-CM | POA: Diagnosis not present

## 2021-03-31 DIAGNOSIS — E1151 Type 2 diabetes mellitus with diabetic peripheral angiopathy without gangrene: Secondary | ICD-10-CM | POA: Diagnosis not present

## 2021-03-31 DIAGNOSIS — L89626 Pressure-induced deep tissue damage of left heel: Secondary | ICD-10-CM | POA: Diagnosis not present

## 2021-03-31 DIAGNOSIS — I5022 Chronic systolic (congestive) heart failure: Secondary | ICD-10-CM | POA: Diagnosis not present

## 2021-03-31 DIAGNOSIS — E039 Hypothyroidism, unspecified: Secondary | ICD-10-CM | POA: Diagnosis not present

## 2021-03-31 DIAGNOSIS — Z8701 Personal history of pneumonia (recurrent): Secondary | ICD-10-CM | POA: Diagnosis not present

## 2021-03-31 DIAGNOSIS — Z7982 Long term (current) use of aspirin: Secondary | ICD-10-CM | POA: Diagnosis not present

## 2021-04-01 LAB — BCR/ABL

## 2021-04-03 DIAGNOSIS — D509 Iron deficiency anemia, unspecified: Secondary | ICD-10-CM | POA: Diagnosis not present

## 2021-04-10 DIAGNOSIS — D509 Iron deficiency anemia, unspecified: Secondary | ICD-10-CM | POA: Diagnosis not present

## 2021-04-14 DIAGNOSIS — Z7984 Long term (current) use of oral hypoglycemic drugs: Secondary | ICD-10-CM | POA: Diagnosis not present

## 2021-04-14 DIAGNOSIS — Z8701 Personal history of pneumonia (recurrent): Secondary | ICD-10-CM | POA: Diagnosis not present

## 2021-04-14 DIAGNOSIS — N183 Chronic kidney disease, stage 3 unspecified: Secondary | ICD-10-CM | POA: Diagnosis not present

## 2021-04-14 DIAGNOSIS — E1151 Type 2 diabetes mellitus with diabetic peripheral angiopathy without gangrene: Secondary | ICD-10-CM | POA: Diagnosis not present

## 2021-04-14 DIAGNOSIS — I482 Chronic atrial fibrillation, unspecified: Secondary | ICD-10-CM | POA: Diagnosis not present

## 2021-04-14 DIAGNOSIS — L89626 Pressure-induced deep tissue damage of left heel: Secondary | ICD-10-CM | POA: Diagnosis not present

## 2021-04-14 DIAGNOSIS — I081 Rheumatic disorders of both mitral and tricuspid valves: Secondary | ICD-10-CM | POA: Diagnosis not present

## 2021-04-14 DIAGNOSIS — Z7982 Long term (current) use of aspirin: Secondary | ICD-10-CM | POA: Diagnosis not present

## 2021-04-14 DIAGNOSIS — I5022 Chronic systolic (congestive) heart failure: Secondary | ICD-10-CM | POA: Diagnosis not present

## 2021-04-14 DIAGNOSIS — I428 Other cardiomyopathies: Secondary | ICD-10-CM | POA: Diagnosis not present

## 2021-04-14 DIAGNOSIS — T82898D Other specified complication of vascular prosthetic devices, implants and grafts, subsequent encounter: Secondary | ICD-10-CM | POA: Diagnosis not present

## 2021-04-14 DIAGNOSIS — C9210 Chronic myeloid leukemia, BCR/ABL-positive, not having achieved remission: Secondary | ICD-10-CM | POA: Diagnosis not present

## 2021-04-14 DIAGNOSIS — I130 Hypertensive heart and chronic kidney disease with heart failure and stage 1 through stage 4 chronic kidney disease, or unspecified chronic kidney disease: Secondary | ICD-10-CM | POA: Diagnosis not present

## 2021-04-14 DIAGNOSIS — E1122 Type 2 diabetes mellitus with diabetic chronic kidney disease: Secondary | ICD-10-CM | POA: Diagnosis not present

## 2021-04-14 DIAGNOSIS — Z79899 Other long term (current) drug therapy: Secondary | ICD-10-CM | POA: Diagnosis not present

## 2021-04-14 DIAGNOSIS — Z9181 History of falling: Secondary | ICD-10-CM | POA: Diagnosis not present

## 2021-04-14 DIAGNOSIS — Z87891 Personal history of nicotine dependence: Secondary | ICD-10-CM | POA: Diagnosis not present

## 2021-04-14 DIAGNOSIS — E039 Hypothyroidism, unspecified: Secondary | ICD-10-CM | POA: Diagnosis not present

## 2021-04-22 ENCOUNTER — Other Ambulatory Visit: Payer: Self-pay

## 2021-04-22 ENCOUNTER — Ambulatory Visit (INDEPENDENT_AMBULATORY_CARE_PROVIDER_SITE_OTHER): Payer: Self-pay | Admitting: Physician Assistant

## 2021-04-22 VITALS — BP 126/63 | HR 83 | Temp 98.4°F | Resp 20 | Ht 68.0 in | Wt 185.6 lb

## 2021-04-22 DIAGNOSIS — I739 Peripheral vascular disease, unspecified: Secondary | ICD-10-CM

## 2021-04-22 DIAGNOSIS — S91302D Unspecified open wound, left foot, subsequent encounter: Secondary | ICD-10-CM

## 2021-04-22 NOTE — Progress Notes (Signed)
POST OPERATIVE OFFICE NOTE    CC:  F/u for heel wound HPI:  This is a 80 y.o. female who is s/p redo left common femoral endarterectomy and left common femoral to BK pop bypass PTFE on 02/24/2021 (for on occluded left common femoral artery to AK popliteal artery bypass).  She returns to today to assess status of left heel wound.  Her daughter accompanies her.  She is ambulating independently.  When outside of her home she will use her cane.  She denies claudication or rest pain.  She is compliant with statin.  She is unable to use antiplatelet medication due to history of profound anemia and GI bleed.  Allergies  Allergen Reactions  . Lisinopril Cough  . Losartan Other (See Comments)    Patient reports nightmares/vivid dreams when taking.    Current Outpatient Medications  Medication Sig Dispense Refill  . cetirizine (ZYRTEC) 10 MG tablet Take 10 mg by mouth daily.    . clorazepate (TRANXENE-T) 7.5 MG tablet Take 1 tablet (7.5 mg total) by mouth 2 (two) times daily as needed for anxiety. 30 tablet 1  . digoxin (LANOXIN) 0.125 MG tablet Take 0.5 tablets (0.0625 mg total) by mouth every other day. 45 tablet 1  . Dulaglutide 3 MG/0.5ML SOPN Inject 3 mg into the skin once a week.    . furosemide (LASIX) 20 MG tablet TAKE 2 TABLETS BY MOUTH IN THE MORNING AND AN EXTRA TABLET IF GAIN 3 OR MORE POUNDS IN 1 DAY 270 tablet 1  . glipiZIDE (GLUCOTROL XL) 10 MG 24 hr tablet Take 10 mg by mouth 2 (two) times daily.     . iron polysaccharides (NIFEREX) 150 MG capsule Take 150 mg by mouth daily.    Marland Kitchen levothyroxine (SYNTHROID) 125 MCG tablet Take 125 mcg by mouth daily before breakfast.    . metoprolol (TOPROL-XL) 200 MG 24 hr tablet TAKE 1 TABLET BY MOUTH EVERY DAY (Patient taking differently: Take 200 mg by mouth daily.) 90 tablet 3  . nilotinib (TASIGNA) 150 MG capsule Take 2 capsules (300 mg total) by mouth every 12 (twelve) hours. Take on an empty stomach, 1 hour before or 2 hours after meals. 120  capsule 0  . oxyCODONE (ROXICODONE) 5 MG immediate release tablet Take 1 tablet (5 mg total) by mouth every 8 (eight) hours as needed. (Patient not taking: Reported on 03/25/2021) 20 tablet 0  . pantoprazole (PROTONIX) 40 MG tablet Take 40 mg by mouth daily.    . rosuvastatin (CRESTOR) 5 MG tablet Take 2 tablets (10 mg total) by mouth daily. 30 tablet 11  . sitaGLIPtin (JANUVIA) 50 MG tablet Take 50 mg by mouth daily as needed (For diabeties).    Marland Kitchen spironolactone (ALDACTONE) 25 MG tablet Take 0.5 tablets (12.5 mg total) by mouth 2 (two) times daily. (Patient taking differently: Take 25 mg by mouth every evening.) 90 tablet 3  . sucralfate (CARAFATE) 1 g tablet Take 1 g by mouth 4 (four) times daily.     No current facility-administered medications for this visit.     ROS:  See HPI Vitals:   04/22/21 1412  Weight: 185 lb 9.6 oz (84.2 kg)  Height: 5\' 8"  (1.727 m)    Physical Exam:  General appearance: Well-developed, well-nourished in no apparent distress. Cardiac: Rate and rhythm are regular Respiratory: Nonlabored Extremities: Left lower extremity: Left heel and left medial malleolar wounds inspected.  These are much improved as compared to 1 month ago.  No erythema or drainage.  2+ left dorsalis pedis palpable pulse Neuro: A and O times 4  Left medial malleolus   Left heel    Assessment/Plan:  This is a 80 y.o. female who is s/p: redo left common femoral endarterectomy and left common femoral to BK pop bypass PTFE on 02/24/2021 (for on occluded left common femoral artery to AK popliteal artery bypass).  He has no lower extremity pain and her wounds are healing.  Continue local wound care.  Continue statin.  The patient has prior history of left renal artery stent placement as well as right external iliac stent placement.  She will follow-up in 6 months for surveillance with renal artery duplex, aortoiliac artery duplex and bilateral lower extremity arterial duplex.  She is  encouraged to call sooner for evaluation should she feel regression in wound healing or develop lower extremity pain.  [name], PA-C [facility] [phone]  Clinic MD: Dr. Carlis Abbott

## 2021-04-23 ENCOUNTER — Ambulatory Visit: Payer: PPO | Admitting: Sports Medicine

## 2021-04-23 ENCOUNTER — Encounter: Payer: Self-pay | Admitting: Sports Medicine

## 2021-04-23 DIAGNOSIS — M79675 Pain in left toe(s): Secondary | ICD-10-CM

## 2021-04-23 DIAGNOSIS — I739 Peripheral vascular disease, unspecified: Secondary | ICD-10-CM

## 2021-04-23 DIAGNOSIS — M79674 Pain in right toe(s): Secondary | ICD-10-CM

## 2021-04-23 DIAGNOSIS — E1142 Type 2 diabetes mellitus with diabetic polyneuropathy: Secondary | ICD-10-CM

## 2021-04-23 DIAGNOSIS — B351 Tinea unguium: Secondary | ICD-10-CM | POA: Diagnosis not present

## 2021-04-23 DIAGNOSIS — L570 Actinic keratosis: Secondary | ICD-10-CM

## 2021-04-23 NOTE — Progress Notes (Signed)
Subjective: Sara Warner is a 80 y.o. female patient who returns office for follow-up evaluation of callus to the bottom of her right foot and for nail care.  Patient also states that she has a rash that does not itch to her left foot and leg.  Blood sugar this morning was not recorded A1c 8 Last PCP visit over a month ago   Patient Active Problem List   Diagnosis Date Noted  . Angiodysplasia of duodenum   . Upper GI bleed 03/10/2021  . Acute blood loss anemia 03/10/2021  . Melena   . Renal artery stenosis ([place]) 10/22/2020  . Peripheral arterial disease ([place]) 06/20/2020  . Iron deficiency anemia 04/22/2020  . Epistaxis   . Occult blood in stools   . Antiplatelet or antithrombotic long-term use   . Symptomatic anemia 04/03/2020  . 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 PAD (peripheral artery disease) ([place]) 12/15/2019  . Critical lower limb ischemia ([place]) 11/28/2019  . Bilateral impacted cerumen 06/23/2017  . 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 NICM (nonischemic cardiomyopathy) ([place])   . Chronic atrial fibrillation ([place])   . Hypertensive heart disease   . Stage III chronic kidney disease ([place]) 10/02/2015  . Controlled type 2 diabetes mellitus without complication ([place]) 04/88/8916  . 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 Atrial fibrillation ([place]) 01/05/2012  . Hypothyroid 01/05/2012  . Cardiomyopathy ([place]) 01/05/2012    Current Outpatient Medications on File Prior to Visit  Medication Sig Dispense Refill  . cetirizine (ZYRTEC) 10 MG tablet Take 10 mg by mouth daily.    . clorazepate (TRANXENE-T) 7.5 MG tablet Take 1 tablet (7.5 mg total) by mouth 2 (two) times daily as needed for anxiety. 30 tablet 1  . digoxin (LANOXIN) 0.125 MG tablet Take 0.5 tablets (0.0625 mg total) by mouth every other day. 45 tablet 1  . Dulaglutide 3 MG/0.5ML SOPN Inject 3 mg into the skin once a week.    .  furosemide (LASIX) 20 MG tablet TAKE 2 TABLETS BY MOUTH IN THE MORNING AND AN EXTRA TABLET IF GAIN 3 OR MORE POUNDS IN 1 DAY 270 tablet 1  . glipiZIDE (GLUCOTROL XL) 10 MG 24 hr tablet Take 10 mg by mouth 2 (two) times daily.     . iron polysaccharides (NIFEREX) 150 MG capsule Take 150 mg by mouth daily.    Marland Kitchen levothyroxine (SYNTHROID) 125 MCG tablet Take 125 mcg by mouth daily before breakfast.    . metoprolol (TOPROL-XL) 200 MG 24 hr tablet TAKE 1 TABLET BY MOUTH EVERY DAY (Patient taking differently: Take 200 mg by mouth daily.) 90 tablet 3  . nilotinib (TASIGNA) 150 MG capsule Take 2 capsules (300 mg total) by mouth every 12 (twelve) hours. Take on an empty stomach, 1 hour before or 2 hours after meals. 120 capsule 0  . oxyCODONE (ROXICODONE) 5 MG immediate release tablet Take 1 tablet (5 mg total) by mouth every 8 (eight) hours as needed. 20 tablet 0  . pantoprazole (PROTONIX) 40 MG tablet Take 40 mg by mouth daily.    . rosuvastatin (CRESTOR) 5 MG tablet Take 2 tablets (10 mg total) by mouth daily. 30 tablet 11  . sitaGLIPtin (JANUVIA) 50 MG tablet Take 50 mg by mouth daily as needed (For diabeties).    Marland Kitchen spironolactone (ALDACTONE) 25 MG tablet Take 0.5 tablets (12.5 mg total) by mouth 2 (two) times daily. (Patient taking differently: Take 25 mg by  mouth every evening.) 90 tablet 3  . sucralfate (CARAFATE) 1 g tablet Take 1 g by mouth 4 (four) times daily.     No current facility-administered medications on file prior to visit.    Allergies  Allergen Reactions  . Lisinopril Cough  . Losartan Other (See Comments)    Patient reports nightmares/vivid dreams when taking.    Objective:  General: Alert and oriented x3 in no acute distress  Dermatology: Callus submet 1 on the right w with no underlying opening.  Nails x10 are thickened elongated consistent with onychomycosis.  Vascular: Dorsalis Pedis and Posterior Tibial pedal pulses palpable 1/4, Capillary Fill Time 5 seconds,(+) pedal  hair growth bilateral, Trace edema bilateral lower extremities with most swelling at left ankle and venous skin changes left greater than right lower extremity, Temperature gradient within normal limits.  Neurology: Johney Maine sensation intact via light touch bilateral, Protective diminished bilateral.  Musculoskeletal: Mild tenderness palpation to left foot and ankle that radiates to her lower leg with swelling concerning for possible vascular component.  Pes planus foot type.  Prominent metatarsal heads/hammertoe deformities noted.  Assessment and Plan: Problem List Items Addressed This Visit   None   Visit Diagnoses    Pain due to onychomycosis of toenails of both feet    -  Primary   Keratosis       PVD (peripheral vascular disease) ([place])       DM type 2 with diabetic peripheral neuropathy (HCC)          -Complete examination performed -Mechanically debrided nails x10 using a sterile nail nipper without incident - Mechanically debrided callus submet 1 on right using a sterile chisel blade without incident - Advised patient to try cortisone cream for venous dermatitis on left -Return in 10-12 weeks for routine foot care  Landis Martins, DPM

## 2021-04-25 ENCOUNTER — Other Ambulatory Visit: Payer: Self-pay

## 2021-04-25 DIAGNOSIS — I701 Atherosclerosis of renal artery: Secondary | ICD-10-CM

## 2021-04-25 DIAGNOSIS — I70229 Atherosclerosis of native arteries of extremities with rest pain, unspecified extremity: Secondary | ICD-10-CM

## 2021-04-30 DIAGNOSIS — Z7982 Long term (current) use of aspirin: Secondary | ICD-10-CM | POA: Diagnosis not present

## 2021-04-30 DIAGNOSIS — N183 Chronic kidney disease, stage 3 unspecified: Secondary | ICD-10-CM | POA: Diagnosis not present

## 2021-04-30 DIAGNOSIS — T82898D Other specified complication of vascular prosthetic devices, implants and grafts, subsequent encounter: Secondary | ICD-10-CM | POA: Diagnosis not present

## 2021-04-30 DIAGNOSIS — I482 Chronic atrial fibrillation, unspecified: Secondary | ICD-10-CM | POA: Diagnosis not present

## 2021-04-30 DIAGNOSIS — Z87891 Personal history of nicotine dependence: Secondary | ICD-10-CM | POA: Diagnosis not present

## 2021-04-30 DIAGNOSIS — Z7984 Long term (current) use of oral hypoglycemic drugs: Secondary | ICD-10-CM | POA: Diagnosis not present

## 2021-04-30 DIAGNOSIS — I130 Hypertensive heart and chronic kidney disease with heart failure and stage 1 through stage 4 chronic kidney disease, or unspecified chronic kidney disease: Secondary | ICD-10-CM | POA: Diagnosis not present

## 2021-04-30 DIAGNOSIS — I081 Rheumatic disorders of both mitral and tricuspid valves: Secondary | ICD-10-CM | POA: Diagnosis not present

## 2021-04-30 DIAGNOSIS — Z9181 History of falling: Secondary | ICD-10-CM | POA: Diagnosis not present

## 2021-04-30 DIAGNOSIS — I5022 Chronic systolic (congestive) heart failure: Secondary | ICD-10-CM | POA: Diagnosis not present

## 2021-04-30 DIAGNOSIS — Z8701 Personal history of pneumonia (recurrent): Secondary | ICD-10-CM | POA: Diagnosis not present

## 2021-04-30 DIAGNOSIS — I428 Other cardiomyopathies: Secondary | ICD-10-CM | POA: Diagnosis not present

## 2021-04-30 DIAGNOSIS — E1151 Type 2 diabetes mellitus with diabetic peripheral angiopathy without gangrene: Secondary | ICD-10-CM | POA: Diagnosis not present

## 2021-04-30 DIAGNOSIS — Z79899 Other long term (current) drug therapy: Secondary | ICD-10-CM | POA: Diagnosis not present

## 2021-04-30 DIAGNOSIS — C9210 Chronic myeloid leukemia, BCR/ABL-positive, not having achieved remission: Secondary | ICD-10-CM | POA: Diagnosis not present

## 2021-04-30 DIAGNOSIS — E039 Hypothyroidism, unspecified: Secondary | ICD-10-CM | POA: Diagnosis not present

## 2021-04-30 DIAGNOSIS — E1122 Type 2 diabetes mellitus with diabetic chronic kidney disease: Secondary | ICD-10-CM | POA: Diagnosis not present

## 2021-04-30 DIAGNOSIS — L89626 Pressure-induced deep tissue damage of left heel: Secondary | ICD-10-CM | POA: Diagnosis not present

## 2021-05-01 DIAGNOSIS — Z6828 Body mass index (BMI) 28.0-28.9, adult: Secondary | ICD-10-CM | POA: Diagnosis not present

## 2021-05-01 DIAGNOSIS — I4891 Unspecified atrial fibrillation: Secondary | ICD-10-CM | POA: Diagnosis not present

## 2021-05-01 DIAGNOSIS — I739 Peripheral vascular disease, unspecified: Secondary | ICD-10-CM | POA: Diagnosis not present

## 2021-05-01 DIAGNOSIS — D509 Iron deficiency anemia, unspecified: Secondary | ICD-10-CM | POA: Diagnosis not present

## 2021-05-01 DIAGNOSIS — Z9181 History of falling: Secondary | ICD-10-CM | POA: Diagnosis not present

## 2021-05-01 DIAGNOSIS — E890 Postprocedural hypothyroidism: Secondary | ICD-10-CM | POA: Diagnosis not present

## 2021-05-01 DIAGNOSIS — N184 Chronic kidney disease, stage 4 (severe): Secondary | ICD-10-CM | POA: Diagnosis not present

## 2021-05-01 DIAGNOSIS — E119 Type 2 diabetes mellitus without complications: Secondary | ICD-10-CM | POA: Diagnosis not present

## 2021-05-01 DIAGNOSIS — F419 Anxiety disorder, unspecified: Secondary | ICD-10-CM | POA: Diagnosis not present

## 2021-05-23 DIAGNOSIS — E1142 Type 2 diabetes mellitus with diabetic polyneuropathy: Secondary | ICD-10-CM | POA: Diagnosis not present

## 2021-05-23 DIAGNOSIS — D6869 Other thrombophilia: Secondary | ICD-10-CM | POA: Diagnosis not present

## 2021-05-23 DIAGNOSIS — C9210 Chronic myeloid leukemia, BCR/ABL-positive, not having achieved remission: Secondary | ICD-10-CM | POA: Diagnosis not present

## 2021-05-23 DIAGNOSIS — Z95820 Peripheral vascular angioplasty status with implants and grafts: Secondary | ICD-10-CM | POA: Diagnosis not present

## 2021-05-23 DIAGNOSIS — E261 Secondary hyperaldosteronism: Secondary | ICD-10-CM | POA: Diagnosis not present

## 2021-05-23 DIAGNOSIS — I429 Cardiomyopathy, unspecified: Secondary | ICD-10-CM | POA: Diagnosis not present

## 2021-05-23 DIAGNOSIS — I480 Paroxysmal atrial fibrillation: Secondary | ICD-10-CM | POA: Diagnosis not present

## 2021-05-26 ENCOUNTER — Telehealth: Payer: Self-pay

## 2021-05-26 NOTE — Telephone Encounter (Signed)
Patient calls today to report a knot on the top of her left foot on the outside of her ankle. It has been there for about 2 weeks and is the size of a 50 cent piece. Denies any pain, area is not expanding, discolored or swollen. Says her wounds are continuing to heal and denies any other issues. Advised to keep an eye on the area and instructed to call back if she developed any pain or other issues. Patient verbalizes understanding.

## 2021-06-09 DIAGNOSIS — N184 Chronic kidney disease, stage 4 (severe): Secondary | ICD-10-CM | POA: Diagnosis not present

## 2021-06-09 DIAGNOSIS — E119 Type 2 diabetes mellitus without complications: Secondary | ICD-10-CM | POA: Diagnosis not present

## 2021-06-09 DIAGNOSIS — D509 Iron deficiency anemia, unspecified: Secondary | ICD-10-CM | POA: Diagnosis not present

## 2021-06-09 DIAGNOSIS — Z8719 Personal history of other diseases of the digestive system: Secondary | ICD-10-CM | POA: Diagnosis not present

## 2021-06-09 DIAGNOSIS — Z6828 Body mass index (BMI) 28.0-28.9, adult: Secondary | ICD-10-CM | POA: Diagnosis not present

## 2021-06-25 ENCOUNTER — Telehealth: Payer: Self-pay | Admitting: Oncology

## 2021-06-25 NOTE — Telephone Encounter (Signed)
Rescheduled 07/28 appointment to August per provider pal, patient has been called and notified.

## 2021-06-26 ENCOUNTER — Telehealth: Payer: Self-pay

## 2021-06-26 ENCOUNTER — Ambulatory Visit: Payer: PPO | Admitting: Oncology

## 2021-06-26 ENCOUNTER — Other Ambulatory Visit: Payer: PPO

## 2021-06-26 NOTE — Telephone Encounter (Signed)
Patient had right fem pop bypass with bovine patch angioplasty in January 2021. Over the past couple of days, a "purple vein has popped out." Says there is a small area on the scar below her knee which was previously healed. It is "about the size of my pinky nail, and looks like a dang big ole dog tick." She denies any drainage, redness, swelling or pain. Discussed with PA - advised patient to monitor area and call back if changes.

## 2021-07-02 DIAGNOSIS — D631 Anemia in chronic kidney disease: Secondary | ICD-10-CM | POA: Diagnosis not present

## 2021-07-02 DIAGNOSIS — N2581 Secondary hyperparathyroidism of renal origin: Secondary | ICD-10-CM | POA: Diagnosis not present

## 2021-07-02 DIAGNOSIS — Z7689 Persons encountering health services in other specified circumstances: Secondary | ICD-10-CM | POA: Diagnosis not present

## 2021-07-02 DIAGNOSIS — I701 Atherosclerosis of renal artery: Secondary | ICD-10-CM | POA: Diagnosis not present

## 2021-07-02 DIAGNOSIS — I129 Hypertensive chronic kidney disease with stage 1 through stage 4 chronic kidney disease, or unspecified chronic kidney disease: Secondary | ICD-10-CM | POA: Diagnosis not present

## 2021-07-02 DIAGNOSIS — N184 Chronic kidney disease, stage 4 (severe): Secondary | ICD-10-CM | POA: Diagnosis not present

## 2021-07-04 DIAGNOSIS — Z6829 Body mass index (BMI) 29.0-29.9, adult: Secondary | ICD-10-CM | POA: Diagnosis not present

## 2021-07-04 DIAGNOSIS — L02415 Cutaneous abscess of right lower limb: Secondary | ICD-10-CM | POA: Diagnosis not present

## 2021-07-11 ENCOUNTER — Telehealth: Payer: Self-pay | Admitting: Oncology

## 2021-07-11 NOTE — Telephone Encounter (Signed)
Sch per r/s from 7/28 los, pt aware.

## 2021-07-15 ENCOUNTER — Other Ambulatory Visit: Payer: Self-pay

## 2021-07-15 ENCOUNTER — Inpatient Hospital Stay (HOSPITAL_BASED_OUTPATIENT_CLINIC_OR_DEPARTMENT_OTHER): Payer: PPO | Admitting: Oncology

## 2021-07-15 ENCOUNTER — Inpatient Hospital Stay: Payer: PPO | Attending: Oncology

## 2021-07-15 VITALS — BP 117/57 | HR 98 | Temp 98.3°F | Resp 18 | Ht 68.0 in | Wt 186.4 lb

## 2021-07-15 DIAGNOSIS — C9210 Chronic myeloid leukemia, BCR/ABL-positive, not having achieved remission: Secondary | ICD-10-CM | POA: Insufficient documentation

## 2021-07-15 DIAGNOSIS — I739 Peripheral vascular disease, unspecified: Secondary | ICD-10-CM | POA: Diagnosis not present

## 2021-07-15 DIAGNOSIS — D72829 Elevated white blood cell count, unspecified: Secondary | ICD-10-CM | POA: Insufficient documentation

## 2021-07-15 DIAGNOSIS — D509 Iron deficiency anemia, unspecified: Secondary | ICD-10-CM | POA: Insufficient documentation

## 2021-07-15 LAB — CBC WITH DIFFERENTIAL (CANCER CENTER ONLY)
Abs Immature Granulocytes: 0.12 10*3/uL — ABNORMAL HIGH (ref 0.00–0.07)
Basophils Absolute: 0.2 10*3/uL — ABNORMAL HIGH (ref 0.0–0.1)
Basophils Relative: 1 %
Eosinophils Absolute: 0.4 10*3/uL (ref 0.0–0.5)
Eosinophils Relative: 3 %
HCT: 37.7 % (ref 36.0–46.0)
Hemoglobin: 11.4 g/dL — ABNORMAL LOW (ref 12.0–15.0)
Immature Granulocytes: 1 %
Lymphocytes Relative: 24 %
Lymphs Abs: 3.2 10*3/uL (ref 0.7–4.0)
MCH: 22.1 pg — ABNORMAL LOW (ref 26.0–34.0)
MCHC: 30.2 g/dL (ref 30.0–36.0)
MCV: 72.9 fL — ABNORMAL LOW (ref 80.0–100.0)
Monocytes Absolute: 0.9 10*3/uL (ref 0.1–1.0)
Monocytes Relative: 7 %
Neutro Abs: 8.6 10*3/uL — ABNORMAL HIGH (ref 1.7–7.7)
Neutrophils Relative %: 64 %
Platelet Count: 476 10*3/uL — ABNORMAL HIGH (ref 150–400)
RBC: 5.17 MIL/uL — ABNORMAL HIGH (ref 3.87–5.11)
RDW: 22.5 % — ABNORMAL HIGH (ref 11.5–15.5)
WBC Count: 13.3 10*3/uL — ABNORMAL HIGH (ref 4.0–10.5)
nRBC: 0 % (ref 0.0–0.2)

## 2021-07-15 LAB — CMP (CANCER CENTER ONLY)
ALT: 14 U/L (ref 0–44)
AST: 11 U/L — ABNORMAL LOW (ref 15–41)
Albumin: 4.3 g/dL (ref 3.5–5.0)
Alkaline Phosphatase: 58 U/L (ref 38–126)
Anion gap: 10 (ref 5–15)
BUN: 49 mg/dL — ABNORMAL HIGH (ref 8–23)
CO2: 27 mmol/L (ref 22–32)
Calcium: 9.7 mg/dL (ref 8.9–10.3)
Chloride: 104 mmol/L (ref 98–111)
Creatinine: 2.3 mg/dL — ABNORMAL HIGH (ref 0.44–1.00)
GFR, Estimated: 21 mL/min — ABNORMAL LOW (ref >60)
Glucose, Bld: 187 mg/dL — ABNORMAL HIGH (ref 70–99)
Potassium: 4.8 mmol/L (ref 3.5–5.1)
Sodium: 141 mmol/L (ref 135–145)
Total Bilirubin: 0.5 mg/dL (ref 0.3–1.2)
Total Protein: 6.9 g/dL (ref 6.5–8.1)

## 2021-07-15 NOTE — Progress Notes (Signed)
Hematology and Oncology Follow Up Visit  Sara Warner [id number] 04-27-1941 80 y.o. 07/15/2021 3:01 PM   Principle Diagnosis: 80 year old woman with CML presented with leukocytosis in 2013.  She was found to have chronic phase at that time.  Current treatment: Tasigna 300 mg bid started 11/2012 with complete hematological and molecular remission.  Therapy has been on hold due to health concerns in April 2022.  Interim History: Sara Warner is here for a follow-up visit.  Since her last visit, she reports no major complaints.  She continues to recover from her recent vascular surgery and has improved at this time.  Her wounds have healed significantly with the ulcer on her heel has fully healed.  She denies any shortness of breath or difficulty breathing.  She denies any hematochezia or melena.      Medications: Reviewed without changes. Current Outpatient Medications  Medication Sig Dispense Refill   cetirizine (ZYRTEC) 10 MG tablet Take 10 mg by mouth daily.     clorazepate (TRANXENE-T) 7.5 MG tablet Take 1 tablet (7.5 mg total) by mouth 2 (two) times daily as needed for anxiety. 30 tablet 1   digoxin (LANOXIN) 0.125 MG tablet Take 0.5 tablets (0.0625 mg total) by mouth every other day. 45 tablet 1   Dulaglutide 3 MG/0.5ML SOPN Inject 3 mg into the skin once a week.     furosemide (LASIX) 20 MG tablet TAKE 2 TABLETS BY MOUTH IN THE MORNING AND AN EXTRA TABLET IF GAIN 3 OR MORE POUNDS IN 1 DAY 270 tablet 1   glipiZIDE (GLUCOTROL XL) 10 MG 24 hr tablet Take 10 mg by mouth 2 (two) times daily.      iron polysaccharides (NIFEREX) 150 MG capsule Take 150 mg by mouth daily.     levothyroxine (SYNTHROID) 125 MCG tablet Take 125 mcg by mouth daily before breakfast.     metoprolol (TOPROL-XL) 200 MG 24 hr tablet TAKE 1 TABLET BY MOUTH EVERY DAY (Patient taking differently: Take 200 mg by mouth daily.) 90 tablet 3   nilotinib (TASIGNA) 150 MG capsule Take 2 capsules (300 mg total) by mouth  every 12 (twelve) hours. Take on an empty stomach, 1 hour before or 2 hours after meals. 120 capsule 0   pantoprazole (PROTONIX) 40 MG tablet Take 40 mg by mouth daily.     rosuvastatin (CRESTOR) 5 MG tablet Take 2 tablets (10 mg total) by mouth daily. 30 tablet 11   sitaGLIPtin (JANUVIA) 50 MG tablet Take 50 mg by mouth daily as needed (For diabeties).     spironolactone (ALDACTONE) 25 MG tablet Take 0.5 tablets (12.5 mg total) by mouth 2 (two) times daily. (Patient taking differently: Take 25 mg by mouth every evening.) 90 tablet 3   sucralfate (CARAFATE) 1 g tablet Take 1 g by mouth 4 (four) times daily.     No current facility-administered medications for this visit.    Allergies:  Allergies  Allergen Reactions   Lisinopril Cough   Losartan Other (See Comments)    Patient reports nightmares/vivid dreams when taking.       Physical Exam:       ECOG: 1    General appearance: Comfortable appearing without any discomfort Head: Normocephalic without any trauma Oropharynx: Mucous membranes are moist and pink without any thrush or ulcers. Eyes: Pupils are equal and round reactive to light. Lymph nodes: No cervical, supraclavicular, inguinal or axillary lymphadenopathy.   Heart:regular rate and rhythm.  S1 and S2 without leg edema. Lung: Clear without  any rhonchi or wheezes.  No dullness to percussion. Abdomin: Soft, nontender, nondistended with good bowel sounds.  No hepatosplenomegaly. Musculoskeletal: No joint deformity or effusion.  Full range of motion noted. Neurological: No deficits noted on motor, sensory and deep tendon reflex exam. Skin: No petechial rash or dryness.  Appeared moist.          Lab Results: Lab Results  Component Value Date   WBC 12.7 (H) 03/27/2021   HGB 8.4 (L) 03/27/2021   HCT 27.3 (L) 03/27/2021   MCV 86.4 03/27/2021   PLT 420 (H) 03/27/2021     Chemistry      Component Value Date/Time   NA 143 03/27/2021 1031   NA 140  03/22/2018 0957   NA 140 09/08/2017 1417   K 3.5 03/27/2021 1031   K 4.0 09/08/2017 1417   CL 108 03/27/2021 1031   CL 106 04/26/2013 1252   CO2 25 03/27/2021 1031   CO2 24 09/08/2017 1417   BUN 25 (H) 03/27/2021 1031   BUN 28 (H) 03/22/2018 0957   BUN 26.9 (H) 09/08/2017 1417   CREATININE 1.23 (H) 03/27/2021 1031   CREATININE 1.4 (H) 09/08/2017 1417      Component Value Date/Time   CALCIUM 8.7 (L) 03/27/2021 1031   CALCIUM 9.5 09/08/2017 1417   ALKPHOS 52 03/27/2021 1031   ALKPHOS 44 09/08/2017 1417   AST 7 (L) 03/27/2021 1031   AST 19 09/08/2017 1417   ALT <6 03/27/2021 1031   ALT 23 09/08/2017 1417   BILITOT 0.8 03/27/2021 1031   BILITOT 0.65 09/08/2017 1417         Impression and Plan:   80 year old woman with:   1.  Chronic phase CML diagnosed in 2013.  She was found to have leukocytosis and achieved hematological and molecular remission at this time.   Tasigna has been on hold due to other health issues after achieving hematological and molecular remission.  Risks and benefits of restarting this medication were discussed at this time.  Laboratory data reviewed today and showed mild leukocytosis which is chronic in nature and does not indicate relapsed disease.  After discussion today, she opted to continue to hold treatment for her CML and that we will restart it in the future after she is fully recovered.  She understands if her leukocytosis is exacerbated we will need to start the medication as well.   2.  Iron deficiency anemia: She continues to be on iron supplements.  Hemoglobin is improved.   3.  Peripheral vascular disease: She continues to follow with vascular surgery after redo left common femoral endarterectomy.  She has a left heel wound that is healing properly.  4. Follow-up: In 5 months for repeat evaluation.  30  minutes were dedicated to this visit.  The time was spent on reviewing laboratory data, disease status update and treatment options  moving forward.   Sara Warner 8/16/20223:01 PM

## 2021-07-16 LAB — IRON AND TIBC
Iron: 47 ug/dL (ref 41–142)
Saturation Ratios: 11 % — ABNORMAL LOW (ref 21–57)
TIBC: 436 ug/dL (ref 236–444)
UIBC: 389 ug/dL — ABNORMAL HIGH (ref 120–384)

## 2021-07-16 LAB — FERRITIN: Ferritin: 9 ng/mL — ABNORMAL LOW (ref 11–307)

## 2021-07-25 ENCOUNTER — Other Ambulatory Visit: Payer: Self-pay

## 2021-07-25 ENCOUNTER — Encounter: Payer: Self-pay | Admitting: Sports Medicine

## 2021-07-25 ENCOUNTER — Ambulatory Visit (INDEPENDENT_AMBULATORY_CARE_PROVIDER_SITE_OTHER): Payer: PPO | Admitting: Sports Medicine

## 2021-07-25 DIAGNOSIS — M79674 Pain in right toe(s): Secondary | ICD-10-CM

## 2021-07-25 DIAGNOSIS — M7989 Other specified soft tissue disorders: Secondary | ICD-10-CM

## 2021-07-25 DIAGNOSIS — M79675 Pain in left toe(s): Secondary | ICD-10-CM | POA: Diagnosis not present

## 2021-07-25 DIAGNOSIS — E1142 Type 2 diabetes mellitus with diabetic polyneuropathy: Secondary | ICD-10-CM

## 2021-07-25 DIAGNOSIS — I739 Peripheral vascular disease, unspecified: Secondary | ICD-10-CM

## 2021-07-25 DIAGNOSIS — B351 Tinea unguium: Secondary | ICD-10-CM

## 2021-07-25 DIAGNOSIS — M1A9XX1 Chronic gout, unspecified, with tophus (tophi): Secondary | ICD-10-CM

## 2021-07-25 DIAGNOSIS — L570 Actinic keratosis: Secondary | ICD-10-CM

## 2021-07-25 DIAGNOSIS — M79672 Pain in left foot: Secondary | ICD-10-CM

## 2021-07-25 MED ORDER — DOXYCYCLINE HYCLATE 100 MG PO TABS: 100.0000 mg | ORAL_TABLET | Freq: Two times a day (BID) | ORAL | 0 refills | Status: DC

## 2021-07-25 NOTE — Progress Notes (Signed)
Subjective: Sara Warner is a 80 y.o. female patient who returns office for follow-up evaluation of callus to the bottom of her right foot and for nail care.  Patient also states that she has this swelling that has just started at the side of her foot and ankle she has restarted back on her gout medicine.  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[place]) 01/10/2020   PAD (peripheral artery disease) ([place]) 12/15/2019   Critical lower limb ischemia ([place]) 11/28/2019   Bilateral impacted cerumen 06/23/2017   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   Burning tongue 07/17/2016   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     clorazepate (TRANXENE-T) 7.5 MG tablet Take 1 tablet (7.5 mg total) by mouth 2 (two) times daily as needed for anxiety. 30 tablet 1   digoxin (LANOXIN) 0.125 MG tablet Take 0.5 tablets (0.0625 mg total) by mouth every other day. 45 tablet 1   Dulaglutide 3  MG/0.5ML SOPN Inject 3 mg into the skin once a week.     furosemide (LASIX) 20 MG tablet TAKE 2 TABLETS BY MOUTH IN THE MORNING AND AN EXTRA TABLET IF GAIN 3 OR MORE POUNDS IN 1 DAY 270 tablet 1   glipiZIDE (GLUCOTROL XL) 10 MG 24 hr tablet Take 10 mg by mouth 2 (two) times daily.      iron polysaccharides (NIFEREX) 150 MG capsule Take 150 mg by mouth daily.     levothyroxine (SYNTHROID) 125 MCG tablet Take 125 mcg by mouth daily before breakfast.     metoprolol (TOPROL-XL) 200 MG 24 hr tablet TAKE 1 TABLET BY MOUTH EVERY DAY (Patient taking differently: Take 200 mg by mouth daily.) 90 tablet 3   nilotinib (TASIGNA) 150 MG capsule Take 2 capsules (300 mg total) by mouth every 12 (twelve) hours. Take on an empty stomach, 1 hour before or 2 hours after meals. 120 capsule 0   pantoprazole (PROTONIX) 40 MG tablet Take 40 mg by mouth daily.     rosuvastatin (CRESTOR) 5 MG tablet Take 2 tablets (10 mg total) by mouth daily. 30 tablet 11   sitaGLIPtin (JANUVIA) 50 MG tablet Take 50 mg by mouth daily as needed (For diabeties).     spironolactone (ALDACTONE) 25 MG tablet Take 0.5 tablets (12.5 mg total) by mouth 2 (two) times daily. (Patient taking differently: Take 25 mg by mouth every evening.) 90 tablet 3   sucralfate (CARAFATE) 1  g tablet Take 1 g by mouth 4 (four) times daily.     No current facility-administered medications on file prior to visit.    Allergies  Allergen Reactions   Lisinopril Cough   Losartan Other (See Comments)    Patient reports nightmares/vivid dreams when taking.    Objective:  General: Alert and oriented x3 in no acute distress  Dermatology: Callus submet 1 on the right w with no underlying opening.  Nails x10 are thickened elongated consistent with onychomycosis.  Raised soft tissue mass measuring less than 2 cm at the dorsal lateral foot with white tophus present however the mass is slightly fluctuant with localized erythema minimal edema no warmth.  Vascular:  Dorsalis Pedis and Posterior Tibial pedal pulses palpable 1/4, Capillary Fill Time 5 seconds,(+) pedal hair growth bilateral, Trace edema bilateral lower extremities with most swelling at left ankle and venous skin changes left greater than right lower extremity, Temperature gradient within normal limits.  Neurology: Johney Maine sensation intact via light touch bilateral, Protective diminished bilateral.  Musculoskeletal: Mild tenderness palpation to left lateral foot at the area of soft tissue mass/tophi/cyst at.  Pes planus foot type.  Prominent metatarsal heads/hammertoe deformities noted.  Assessment and Plan: Problem List Items Addressed This Visit   None Visit Diagnoses     Pain due to onychomycosis of toenails of both feet    -  Primary   Keratosis       PVD (peripheral vascular disease) ([place])       DM type 2 with diabetic peripheral neuropathy (HCC)       Gouty tophi       Left foot pain       Soft tissue mass           -Complete examination performed -Mechanically debrided nails x10 using a sterile nail nipper without incident - Mechanically debrided callus submet 1 on right using a sterile chisel blade without incident -After verbal consent injected left lateral foot with 3 cc of 1% lidocaine plain and 0.5% Marcaine plain in a local field block fashion.  The left foot was then prepped with Betadine anesthesia was confirmed and using an 18-gauge needle the soft tissue tophus was aspirated removing 3 cc of milky drainage consistent with history of gout.  The area was dressed with antibiotic cream and a compressive Coban dressing.  Advised patient to change dressing starting tomorrow over the next few days using antibiotic cream and Band-Aid. -For preventative measures prescribed doxycycline antibiotic for patient to take as directed to prevent against infection at aspiration site -Return in 2 to 3 weeks for left foot aspiration site check. Landis Martins, DPM

## 2021-08-13 ENCOUNTER — Other Ambulatory Visit: Payer: Self-pay

## 2021-08-13 ENCOUNTER — Encounter: Payer: Self-pay | Admitting: Sports Medicine

## 2021-08-13 ENCOUNTER — Ambulatory Visit: Payer: PPO | Admitting: Sports Medicine

## 2021-08-13 DIAGNOSIS — M1A9XX1 Chronic gout, unspecified, with tophus (tophi): Secondary | ICD-10-CM

## 2021-08-13 DIAGNOSIS — M7989 Other specified soft tissue disorders: Secondary | ICD-10-CM

## 2021-08-13 DIAGNOSIS — E1142 Type 2 diabetes mellitus with diabetic polyneuropathy: Secondary | ICD-10-CM

## 2021-08-13 DIAGNOSIS — I739 Peripheral vascular disease, unspecified: Secondary | ICD-10-CM

## 2021-08-13 NOTE — Progress Notes (Signed)
Subjective: Sara Warner is a 80 y.o. female patient who returns office for follow-up evaluation of cyst reports that the fluid has built back up on the left foot not as big as before does not hurt but wants to talk about having it aspirated again versus other procedures.   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[place]) 01/10/2020   PAD (peripheral artery disease) ([place]) 12/15/2019   Critical lower limb ischemia ([place]) 11/28/2019   Bilateral impacted cerumen 06/23/2017   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   Burning tongue 07/17/2016   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     clorazepate (TRANXENE-T) 7.5 MG tablet Take 1 tablet (7.5 mg total) by mouth 2 (two) times daily as needed for anxiety. 30 tablet 1   colchicine 0.6 MG tablet Take 0.6 mg by mouth 2 (two) times daily.     diclofenac Sodium (VOLTAREN) 1 % GEL SMARTSIG:1 Gram(s) Topical 4 Times Daily PRN     digoxin (LANOXIN) 0.125 MG tablet Take 0.5 tablets (0.0625 mg total) by mouth every other day. 45 tablet 1    doxycycline (VIBRA-TABS) 100 MG tablet Take 1 tablet (100 mg total) by mouth 2 (two) times daily. 20 tablet 0   doxycycline (VIBRAMYCIN) 100 MG capsule Take 100 mg by mouth 2 (two) times daily.     Dulaglutide 3 MG/0.5ML SOPN Inject 3 mg into the skin once a week.     furosemide (LASIX) 20 MG tablet TAKE 2 TABLETS BY MOUTH IN THE MORNING AND AN EXTRA TABLET IF GAIN 3 OR MORE POUNDS IN 1 DAY 270 tablet 1   glipiZIDE (GLUCOTROL XL) 10 MG 24 hr tablet Take 10 mg by mouth 2 (two) times daily.      iron polysaccharides (NIFEREX) 150 MG capsule Take 150 mg by mouth daily.     levothyroxine (SYNTHROID) 125 MCG tablet Take 125 mcg by mouth daily before breakfast.     metoprolol (TOPROL-XL) 200 MG 24 hr tablet TAKE 1 TABLET BY MOUTH EVERY DAY (Patient taking differently: Take 200 mg by mouth daily.) 90 tablet 3   nilotinib (TASIGNA) 150 MG capsule Take 2 capsules (300 mg total) by mouth every 12 (twelve) hours. Take on an empty stomach, 1 hour before or 2 hours after meals. 120 capsule 0   pantoprazole (PROTONIX) 40 MG tablet Take 40 mg by mouth daily.     rosuvastatin (CRESTOR) 5 MG tablet Take 2 tablets (10 mg total) by mouth daily. 30 tablet 11   sitaGLIPtin (JANUVIA) 50 MG tablet Take 50 mg by mouth  daily as needed (For diabeties).     spironolactone (ALDACTONE) 25 MG tablet Take 0.5 tablets (12.5 mg total) by mouth 2 (two) times daily. (Patient taking differently: Take 25 mg by mouth every evening.) 90 tablet 3   sucralfate (CARAFATE) 1 g tablet Take 1 g by mouth 4 (four) times daily.     No current facility-administered medications on file prior to visit.    Allergies  Allergen Reactions   Lisinopril Cough   Losartan Other (See Comments)    Patient reports nightmares/vivid dreams when taking.    Objective:  General: Alert and oriented x3 in no acute distress  Dermatology: Minimal callus submet 1 on the right w with no underlying opening.  Nails x10 are t thickened and short consistent with  onychomycosis.  Raised soft tissue mass measuring 1 cm at the dorsal lateral foot with white tophus present however the mass is slightly fluctuant with localized erythema minimal edema no warmth.  Vascular: Dorsalis Pedis and Posterior Tibial pedal pulses palpable 1/4, Capillary Fill Time 5 seconds,(+) pedal hair growth bilateral, Trace edema bilateral lower extremities with most swelling at left ankle and venous skin changes left greater than right lower extremity, Temperature gradient within normal limits.  Neurology: Johney Maine sensation intact via light touch bilateral, Protective diminished bilateral.  Musculoskeletal: Mild tenderness palpation to left lateral foot at the area of soft tissue mass/tophi/cyst at.  Pes planus foot type.  Prominent metatarsal heads/hammertoe deformities noted.  Assessment and Plan: Problem List Items Addressed This Visit   None Visit Diagnoses     Soft tissue mass    -  Primary   Gouty tophi       PVD (peripheral vascular disease) ([place])       DM type 2 with diabetic peripheral neuropathy (HCC)           -Complete examination performed -Discussed treatment options for recurrence cyst/tophus -After verbal consent injected left lateral foot with 3 cc of 1% lidocaine plain and 0.5% Marcaine plain in a local field block fashion.  The left foot was then prepped with Betadine anesthesia was confirmed and using an 18-gauge needle the soft tissue tophus was aspirated removing 3 cc of milky drainage consistent with history of gout.  This is aspiration #2.  The area was dressed with antibiotic cream and a compressive Coban dressing.  Advised patient to change dressing starting tomorrow over the next few days using antibiotic cream and Band-Aid. -Ordered CT scan for further evaluation of soft tissue mass likely tophus advised patient if this soft tissue mass show 6 significant extension may benefit from surgical excision  -Return to office after CT scan or sooner if  problems or issues arise   Landis Martins, DPM

## 2021-08-19 ENCOUNTER — Telehealth: Payer: Self-pay

## 2021-08-19 NOTE — Telephone Encounter (Signed)
Contacted patient's insurance company to obtained authorization for Starr lower ex w/o contrast. Authorization was not required  [facility] and scheduled pt's appt for 09/02/21  for checking in at 4 for a 4:30 appt.  Contacted pt and advised her of her appt date and time, pt was instructed of location address and phone number, pt stated understanding  Order has been faxed to [facility]

## 2021-08-29 DIAGNOSIS — Z6829 Body mass index (BMI) 29.0-29.9, adult: Secondary | ICD-10-CM | POA: Diagnosis not present

## 2021-08-29 DIAGNOSIS — M109 Gout, unspecified: Secondary | ICD-10-CM | POA: Diagnosis not present

## 2021-08-29 DIAGNOSIS — E119 Type 2 diabetes mellitus without complications: Secondary | ICD-10-CM | POA: Diagnosis not present

## 2021-08-29 DIAGNOSIS — N184 Chronic kidney disease, stage 4 (severe): Secondary | ICD-10-CM | POA: Diagnosis not present

## 2021-09-02 DIAGNOSIS — M10072 Idiopathic gout, left ankle and foot: Secondary | ICD-10-CM | POA: Diagnosis not present

## 2021-09-02 DIAGNOSIS — M7989 Other specified soft tissue disorders: Secondary | ICD-10-CM | POA: Diagnosis not present

## 2021-09-05 ENCOUNTER — Telehealth: Payer: Self-pay

## 2021-09-05 NOTE — Telephone Encounter (Signed)
-----   Message from Landis Martins, [place] sent at 09/04/2021  8:43 PM EDT ----- Regarding: MRI results Will you let patient know that her MRI shows multiple areas of gout. Due to her extensive gout I recommend that we refer patient to a rheumatologist. If she is agreeable I can send her to [facility] rheumatology for further management Thanks Dr. Cannon Kettle

## 2021-09-05 NOTE — Telephone Encounter (Signed)
Called pt and LVM stating to return phone call to review MRI results and Dr. Kristen Cardinal

## 2021-09-10 DIAGNOSIS — N184 Chronic kidney disease, stage 4 (severe): Secondary | ICD-10-CM | POA: Diagnosis not present

## 2021-09-10 DIAGNOSIS — R609 Edema, unspecified: Secondary | ICD-10-CM | POA: Diagnosis not present

## 2021-09-10 DIAGNOSIS — M109 Gout, unspecified: Secondary | ICD-10-CM | POA: Diagnosis not present

## 2021-09-10 DIAGNOSIS — Z23 Encounter for immunization: Secondary | ICD-10-CM | POA: Diagnosis not present

## 2021-09-10 DIAGNOSIS — E890 Postprocedural hypothyroidism: Secondary | ICD-10-CM | POA: Diagnosis not present

## 2021-09-10 DIAGNOSIS — D509 Iron deficiency anemia, unspecified: Secondary | ICD-10-CM | POA: Diagnosis not present

## 2021-09-10 DIAGNOSIS — I4891 Unspecified atrial fibrillation: Secondary | ICD-10-CM | POA: Diagnosis not present

## 2021-09-10 DIAGNOSIS — F419 Anxiety disorder, unspecified: Secondary | ICD-10-CM | POA: Diagnosis not present

## 2021-09-10 DIAGNOSIS — I739 Peripheral vascular disease, unspecified: Secondary | ICD-10-CM | POA: Diagnosis not present

## 2021-09-10 DIAGNOSIS — Z6829 Body mass index (BMI) 29.0-29.9, adult: Secondary | ICD-10-CM | POA: Diagnosis not present

## 2021-09-10 DIAGNOSIS — E1151 Type 2 diabetes mellitus with diabetic peripheral angiopathy without gangrene: Secondary | ICD-10-CM | POA: Diagnosis not present

## 2021-09-30 ENCOUNTER — Ambulatory Visit: Payer: PPO | Admitting: Cardiovascular Disease

## 2021-10-08 ENCOUNTER — Other Ambulatory Visit: Payer: Self-pay | Admitting: Cardiovascular Disease

## 2021-10-10 DIAGNOSIS — C92Z0 Other myeloid leukemia not having achieved remission: Secondary | ICD-10-CM | POA: Diagnosis not present

## 2021-10-10 DIAGNOSIS — I480 Paroxysmal atrial fibrillation: Secondary | ICD-10-CM | POA: Diagnosis not present

## 2021-10-10 DIAGNOSIS — I429 Cardiomyopathy, unspecified: Secondary | ICD-10-CM | POA: Diagnosis not present

## 2021-10-10 DIAGNOSIS — Z95820 Peripheral vascular angioplasty status with implants and grafts: Secondary | ICD-10-CM | POA: Diagnosis not present

## 2021-10-10 DIAGNOSIS — D6869 Other thrombophilia: Secondary | ICD-10-CM | POA: Diagnosis not present

## 2021-10-10 DIAGNOSIS — Z8719 Personal history of other diseases of the digestive system: Secondary | ICD-10-CM | POA: Diagnosis not present

## 2021-10-10 DIAGNOSIS — E1142 Type 2 diabetes mellitus with diabetic polyneuropathy: Secondary | ICD-10-CM | POA: Diagnosis not present

## 2021-10-16 ENCOUNTER — Other Ambulatory Visit: Payer: Self-pay | Admitting: *Deleted

## 2021-10-16 DIAGNOSIS — C9210 Chronic myeloid leukemia, BCR/ABL-positive, not having achieved remission: Secondary | ICD-10-CM

## 2021-10-16 MED ORDER — NILOTINIB HCL 150 MG PO CAPS: 300.0000 mg | ORAL_CAPSULE | Freq: Two times a day (BID) | ORAL | 0 refills | Status: DC

## 2021-11-10 DIAGNOSIS — R609 Edema, unspecified: Secondary | ICD-10-CM | POA: Diagnosis not present

## 2021-11-10 DIAGNOSIS — I4891 Unspecified atrial fibrillation: Secondary | ICD-10-CM | POA: Diagnosis not present

## 2021-11-10 DIAGNOSIS — Z6830 Body mass index (BMI) 30.0-30.9, adult: Secondary | ICD-10-CM | POA: Diagnosis not present

## 2021-11-10 DIAGNOSIS — L299 Pruritus, unspecified: Secondary | ICD-10-CM | POA: Diagnosis not present

## 2021-11-10 DIAGNOSIS — N184 Chronic kidney disease, stage 4 (severe): Secondary | ICD-10-CM | POA: Diagnosis not present

## 2021-11-10 DIAGNOSIS — D509 Iron deficiency anemia, unspecified: Secondary | ICD-10-CM | POA: Diagnosis not present

## 2021-11-10 DIAGNOSIS — M109 Gout, unspecified: Secondary | ICD-10-CM | POA: Diagnosis not present

## 2021-11-10 DIAGNOSIS — F419 Anxiety disorder, unspecified: Secondary | ICD-10-CM | POA: Diagnosis not present

## 2021-11-10 DIAGNOSIS — E1151 Type 2 diabetes mellitus with diabetic peripheral angiopathy without gangrene: Secondary | ICD-10-CM | POA: Diagnosis not present

## 2021-11-10 DIAGNOSIS — I739 Peripheral vascular disease, unspecified: Secondary | ICD-10-CM | POA: Diagnosis not present

## 2021-11-10 DIAGNOSIS — E890 Postprocedural hypothyroidism: Secondary | ICD-10-CM | POA: Diagnosis not present

## 2021-12-03 ENCOUNTER — Telehealth: Payer: Self-pay | Admitting: *Deleted

## 2021-12-03 NOTE — Telephone Encounter (Signed)
PC to patient, informed her Dr. Alen Blew advised she take benadryl - patient states she cannot take benadryl because "it makes my heart race."  Patient also states she contacted her PCP about the itching & he has sent in a rx for her.  Advised patient to take that medication as prescribed.  She verbalizes understanding.

## 2021-12-03 NOTE — Telephone Encounter (Signed)
-----   Message from Wyatt Portela, MD sent at 12/03/2021  4:16 PM EST ----- She can use benadryl first ----- Message ----- From: Rolene Course, RN Sent: 12/03/2021   3:48 PM EST To: Wyatt Portela, MD  This patient called & said she has been having extreme itching for the last 6 months & is asking if you could prescribe something for this.  Please advise.  Thanks, Bethena Roys

## 2021-12-05 ENCOUNTER — Telehealth: Payer: Self-pay

## 2021-12-05 NOTE — Telephone Encounter (Signed)
Oral Oncology Patient Advocate Encounter  Met patient in [place] to complete a re-enrollment application for [employer] [facility] [place] ([employer]) in an effort to reduce the patient's out of pocket expense for Tasigna to $0.    Application completed and faxed to [phone].   [employer] phone number for follow up is [phone].   This encounter will be updated until final determination.   [facility] [place] Phone [phone] Fax [phone] 12/05/2021 5:27 PM

## 2021-12-05 NOTE — Telephone Encounter (Signed)
Patient is approved for Tasigna at no charge from [employer] 11/30/21-11/29/22.   [employer] uses Psychologist, occupational by [employer].  [place] [facility] [place] Phone [phone] Fax [phone] 12/05/2021 5:30 PM

## 2021-12-09 ENCOUNTER — Encounter: Payer: Self-pay | Admitting: Oncology

## 2021-12-10 ENCOUNTER — Encounter: Payer: Self-pay | Admitting: Oncology

## 2021-12-12 DIAGNOSIS — K922 Gastrointestinal hemorrhage, unspecified: Secondary | ICD-10-CM | POA: Diagnosis not present

## 2021-12-12 DIAGNOSIS — E1140 Type 2 diabetes mellitus with diabetic neuropathy, unspecified: Secondary | ICD-10-CM | POA: Diagnosis not present

## 2021-12-12 DIAGNOSIS — M109 Gout, unspecified: Secondary | ICD-10-CM | POA: Diagnosis not present

## 2021-12-12 DIAGNOSIS — I517 Cardiomegaly: Secondary | ICD-10-CM | POA: Diagnosis not present

## 2021-12-12 DIAGNOSIS — E1122 Type 2 diabetes mellitus with diabetic chronic kidney disease: Secondary | ICD-10-CM | POA: Diagnosis not present

## 2021-12-12 DIAGNOSIS — E1151 Type 2 diabetes mellitus with diabetic peripheral angiopathy without gangrene: Secondary | ICD-10-CM | POA: Diagnosis not present

## 2021-12-12 DIAGNOSIS — N183 Chronic kidney disease, stage 3 unspecified: Secondary | ICD-10-CM | POA: Diagnosis not present

## 2021-12-12 DIAGNOSIS — E0590 Thyrotoxicosis, unspecified without thyrotoxic crisis or storm: Secondary | ICD-10-CM | POA: Diagnosis not present

## 2021-12-12 DIAGNOSIS — D6869 Other thrombophilia: Secondary | ICD-10-CM | POA: Diagnosis not present

## 2021-12-12 DIAGNOSIS — C9290 Myeloid leukemia, unspecified, not having achieved remission: Secondary | ICD-10-CM | POA: Diagnosis not present

## 2021-12-12 DIAGNOSIS — I480 Paroxysmal atrial fibrillation: Secondary | ICD-10-CM | POA: Diagnosis not present

## 2021-12-12 DIAGNOSIS — I429 Cardiomyopathy, unspecified: Secondary | ICD-10-CM | POA: Diagnosis not present

## 2021-12-16 ENCOUNTER — Inpatient Hospital Stay: Payer: PPO | Attending: Oncology

## 2021-12-16 ENCOUNTER — Inpatient Hospital Stay: Payer: PPO | Admitting: Oncology

## 2021-12-16 ENCOUNTER — Other Ambulatory Visit: Payer: Self-pay

## 2021-12-16 VITALS — BP 127/69 | HR 72 | Temp 97.9°F | Resp 17 | Ht 68.0 in | Wt 191.2 lb

## 2021-12-16 DIAGNOSIS — C9210 Chronic myeloid leukemia, BCR/ABL-positive, not having achieved remission: Secondary | ICD-10-CM | POA: Diagnosis not present

## 2021-12-16 DIAGNOSIS — D509 Iron deficiency anemia, unspecified: Secondary | ICD-10-CM

## 2021-12-16 DIAGNOSIS — I739 Peripheral vascular disease, unspecified: Secondary | ICD-10-CM | POA: Insufficient documentation

## 2021-12-16 LAB — CMP (CANCER CENTER ONLY)
ALT: 12 U/L (ref 0–44)
AST: 11 U/L — ABNORMAL LOW (ref 15–41)
Albumin: 4.4 g/dL (ref 3.5–5.0)
Alkaline Phosphatase: 61 U/L (ref 38–126)
Anion gap: 9 (ref 5–15)
BUN: 30 mg/dL — ABNORMAL HIGH (ref 8–23)
CO2: 26 mmol/L (ref 22–32)
Calcium: 9.9 mg/dL (ref 8.9–10.3)
Chloride: 103 mmol/L (ref 98–111)
Creatinine: 1.42 mg/dL — ABNORMAL HIGH (ref 0.44–1.00)
GFR, Estimated: 37 mL/min — ABNORMAL LOW (ref >60)
Glucose, Bld: 217 mg/dL — ABNORMAL HIGH (ref 70–99)
Potassium: 4.5 mmol/L (ref 3.5–5.1)
Sodium: 138 mmol/L (ref 135–145)
Total Bilirubin: 0.5 mg/dL (ref 0.3–1.2)
Total Protein: 7 g/dL (ref 6.5–8.1)

## 2021-12-16 LAB — CBC WITH DIFFERENTIAL (CANCER CENTER ONLY)
Abs Immature Granulocytes: 0.09 10*3/uL — ABNORMAL HIGH (ref 0.00–0.07)
Basophils Absolute: 0.2 10*3/uL — ABNORMAL HIGH (ref 0.0–0.1)
Basophils Relative: 1 %
Eosinophils Absolute: 0.3 10*3/uL (ref 0.0–0.5)
Eosinophils Relative: 2 %
HCT: 43 % (ref 36.0–46.0)
Hemoglobin: 13.2 g/dL (ref 12.0–15.0)
Immature Granulocytes: 1 %
Lymphocytes Relative: 20 %
Lymphs Abs: 2.4 10*3/uL (ref 0.7–4.0)
MCH: 25.6 pg — ABNORMAL LOW (ref 26.0–34.0)
MCHC: 30.7 g/dL (ref 30.0–36.0)
MCV: 83.5 fL (ref 80.0–100.0)
Monocytes Absolute: 0.7 10*3/uL (ref 0.1–1.0)
Monocytes Relative: 6 %
Neutro Abs: 8.2 10*3/uL — ABNORMAL HIGH (ref 1.7–7.7)
Neutrophils Relative %: 70 %
Platelet Count: 443 10*3/uL — ABNORMAL HIGH (ref 150–400)
RBC: 5.15 MIL/uL — ABNORMAL HIGH (ref 3.87–5.11)
RDW: 17.9 % — ABNORMAL HIGH (ref 11.5–15.5)
WBC Count: 11.7 10*3/uL — ABNORMAL HIGH (ref 4.0–10.5)
nRBC: 0 % (ref 0.0–0.2)

## 2021-12-16 LAB — FERRITIN: Ferritin: 10 ng/mL — ABNORMAL LOW (ref 11–307)

## 2021-12-16 NOTE — Progress Notes (Signed)
Hematology and Oncology Follow Up Visit  Sara Warner [id number] 16-Jul-1941 81 y.o. 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  Current treatment: Tasigna 300 mg bid started 11/2012 with complete hematological and molecular remission.  Therapy has been on hold due to other medical issues.    Interim History: Sara Warner returns today for repeat follow-up.  Since her last visit, he continues to report improvement in her health.  She denies any nausea, vomiting or abdominal pain.  She denies any recent falls or syncope.  She is ambulating better at this time although she still has weakness in her lower extremities.  Her wound has healed completely at this time.     Medications: Updated on review. Current Outpatient Medications  Medication Sig Dispense Refill   cetirizine (ZYRTEC) 10 MG tablet Take 10 mg by mouth daily.     clorazepate (TRANXENE-T) 7.5 MG tablet Take 1 tablet (7.5 mg total) by mouth 2 (two) times daily as needed for anxiety. 30 tablet 1   colchicine 0.6 MG tablet Take 0.6 mg by mouth 2 (two) times daily.     diclofenac Sodium (VOLTAREN) 1 % GEL SMARTSIG:1 Gram(s) Topical 4 Times Daily PRN     digoxin (LANOXIN) 0.125 MG tablet Take 0.5 tablets (0.0625 mg total) by mouth every other day. 45 tablet 1   doxycycline (VIBRA-TABS) 100 MG tablet Take 1 tablet (100 mg total) by mouth 2 (two) times daily. 20 tablet 0   doxycycline (VIBRAMYCIN) 100 MG capsule Take 100 mg by mouth 2 (two) times daily.     Dulaglutide 3 MG/0.5ML SOPN Inject 3 mg into the skin once a week.     furosemide (LASIX) 20 MG tablet TAKE 2 TABLETS BY MOUTH IN THE MORNING AND AN EXTRA TABLET IF GAIN 3 OR MORE POUNDS IN 1 DAY 270 tablet 1   glipiZIDE (GLUCOTROL XL) 10 MG 24 hr tablet Take 10 mg by mouth 2 (two) times daily.      iron polysaccharides (NIFEREX) 150 MG  capsule Take 150 mg by mouth daily.     levothyroxine (SYNTHROID) 125 MCG tablet Take 125 mcg by mouth daily before breakfast.     metoprolol (TOPROL-XL) 200 MG 24 hr tablet TAKE 1 TABLET BY MOUTH EVERY DAY 90 tablet 0   nilotinib (TASIGNA) 150 MG capsule Take 2 capsules (300 mg total) by mouth every 12 (twelve) hours. Take on an empty stomach, 1 hour before or 2 hours after meals. 120 capsule 0   pantoprazole (PROTONIX) 40 MG tablet Take 40 mg by mouth daily.     rosuvastatin (CRESTOR) 5 MG tablet Take 2 tablets (10 mg total) by mouth daily. 30 tablet 11   sitaGLIPtin (JANUVIA) 50 MG tablet Take 50 mg by mouth daily as needed (For diabeties).     spironolactone (ALDACTONE) 25 MG tablet Take 0.5 tablets (12.5 mg total) by mouth 2 (two) times daily. (Patient taking differently: Take 25 mg by mouth every evening.) 90 tablet 3   sucralfate (CARAFATE) 1 g tablet Take 1 g by mouth 4 (four) times daily.     No current facility-administered medications for this visit.    Allergies:  Allergies  Allergen Reactions   Lisinopril Cough   Losartan Other (See Comments)    Patient reports nightmares/vivid dreams when taking.       Physical Exam:   Blood pressure 127/69, pulse  72, temperature 97.9 F (36.6 C), temperature source Temporal, resp. rate 17, height 5' 8"  (1.727 m), weight 191 lb 3.2 oz (86.7 kg), SpO2 95 %.     ECOG: 1    General appearance: Alert, awake without any distress. Head: Atraumatic without abnormalities Oropharynx: Without any thrush or ulcers. Eyes: No scleral icterus. Lymph nodes: No lymphadenopathy noted in the cervical, supraclavicular, or axillary nodes Heart:regular rate and rhythm, without any murmurs or gallops.   Lung: Clear to auscultation without any rhonchi, wheezes or dullness to percussion. Abdomin: Soft, nontender without any shifting dullness or ascites. Musculoskeletal: No clubbing or cyanosis. Neurological: No motor or sensory deficits. Skin: No  rashes or lesions.          Lab Results: Lab Results  Component Value Date   WBC 13.3 (H) 07/15/2021   HGB 11.4 (L) 07/15/2021   HCT 37.7 07/15/2021   MCV 72.9 (L) 07/15/2021   PLT 476 (H) 07/15/2021     Chemistry      Component Value Date/Time   NA 141 07/15/2021 1512   NA 140 03/22/2018 0957   NA 140 09/08/2017 1417   K 4.8 07/15/2021 1512   K 4.0 09/08/2017 1417   CL 104 07/15/2021 1512   CL 106 04/26/2013 1252   CO2 27 07/15/2021 1512   CO2 24 09/08/2017 1417   BUN 49 (H) 07/15/2021 1512   BUN 28 (H) 03/22/2018 0957   BUN 26.9 (H) 09/08/2017 1417   CREATININE 2.30 (H) 07/15/2021 1512   CREATININE 1.4 (H) 09/08/2017 1417      Component Value Date/Time   CALCIUM 9.7 07/15/2021 1512   CALCIUM 9.5 09/08/2017 1417   ALKPHOS 58 07/15/2021 1512   ALKPHOS 44 09/08/2017 1417   AST 11 (L) 07/15/2021 1512   AST 19 09/08/2017 1417   ALT 14 07/15/2021 1512   ALT 23 09/08/2017 1417   BILITOT 0.5 07/15/2021 1512   BILITOT 0.65 09/08/2017 1417         Impression and Plan:   81 year old woman with:   1.  CML diagnosed in 2013.  She presented with chronic phase and leukocytosis.  She has been off treatment at this time due to concerns related to other health issues.  Risks and benefits of resuming treatment were discussed at this time.  Potential complications from being off treatment were reviewed including symptomatic leukemia with splenomegaly and bruising and possible transformation to an acute leukemia.  Laboratory data from today reviewed and showed her CBC just about normalized with mild elevation in her white cell count and platelets.  After discussion she opted against restarting treatment and preferred to have additional time to recover.  We will evaluate this issue in 6 months.  She understands that she will eventually need to be back on treatment.    2.  Iron deficiency anemia: Resolved at this time with normalization of her counts.  3.  Peripheral  vascular disease: She has a left heel wound which has completely healed at this time.  4. Follow-up: In 6 months for repeat follow-up.  30  minutes were spent on this encounter.  The time was dedicated to reviewing laboratory data, disease status update, treatment choices and complications related to therapy.   Roxy Cedar Kensley Valladares 1/17/20239:20 AM

## 2021-12-23 ENCOUNTER — Ambulatory Visit (INDEPENDENT_AMBULATORY_CARE_PROVIDER_SITE_OTHER)
Admission: RE | Admit: 2021-12-23 | Discharge: 2021-12-23 | Disposition: A | Discharge status: Home / Self Care | Payer: PPO | Source: Ambulatory Visit | Attending: Vascular Surgery | Admitting: Vascular Surgery

## 2021-12-23 ENCOUNTER — Encounter (HOSPITAL_COMMUNITY): Payer: PPO

## 2021-12-23 ENCOUNTER — Encounter: Payer: Self-pay | Admitting: Physician Assistant

## 2021-12-23 ENCOUNTER — Other Ambulatory Visit: Payer: Self-pay

## 2021-12-23 ENCOUNTER — Ambulatory Visit (HOSPITAL_COMMUNITY)
Admission: RE | Admit: 2021-12-23 | Discharge: 2021-12-23 | Disposition: A | Discharge status: Home / Self Care | Payer: PPO | Source: Ambulatory Visit | Attending: Vascular Surgery | Admitting: Vascular Surgery

## 2021-12-23 ENCOUNTER — Ambulatory Visit (INDEPENDENT_AMBULATORY_CARE_PROVIDER_SITE_OTHER): Payer: PPO | Admitting: Physician Assistant

## 2021-12-23 VITALS — BP 103/73 | HR 88 | Temp 97.3°F | Ht 68.0 in | Wt 192.7 lb

## 2021-12-23 DIAGNOSIS — I739 Peripheral vascular disease, unspecified: Secondary | ICD-10-CM

## 2021-12-23 DIAGNOSIS — I701 Atherosclerosis of renal artery: Secondary | ICD-10-CM

## 2021-12-23 DIAGNOSIS — I70229 Atherosclerosis of native arteries of extremities with rest pain, unspecified extremity: Secondary | ICD-10-CM | POA: Insufficient documentation

## 2021-12-23 NOTE — Progress Notes (Signed)
Office Note     CC:  follow up Requesting Provider:  Cyndi Bender, PA-C  HPI: Sara Warner is a 81 y.o. ([birth year]) female who presents for follow up of peripheral artery disease. Her past surgical history includes most recently redo left common femoral endarterectomy and left common femoral to BK pop bypass PTFE on 02/24/2021 for heel ulceration. Prior history of occluded left common femoral artery to AK popliteal artery bypass 06/20/20 by Dr. Carlis Abbott. Left renal artery stenting by Dr. Trula Slade on 02/18/21 with history of CKD III. She has history of right common femoral to below knee popliteal artery vein bypass 12/15/19 and prior to that she had right ext. Iliac artery angioplasty and stenting on 12/06/19 by Dr. Carlis Abbott. 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  She returns to today for follow up with non invasive studies.  She is ambulating independently. When outside of her home she will use her cane.  She denies claudication or rest pain. She describes tiredness in both her legs that is equal bilaterally. This occurs after prolonged standing or ambulation. Her Left heel wound remains healed. She did stub her left 5th toe on the foot of the couch the other day she explains but feels it is doing okay.   She is compliant with statin.  She is unable to use antiplatelet medication due to history of profound anemia and GI bleed.  The pt is on a statin for cholesterol management.  The pt is not on a daily aspirin.   Other AC:  none The pt is on BB for hypertension.   The pt is diabetic.   Tobacco hx:  Former  Past Medical History:  Diagnosis Date   Atrial fibrillation ([place])    Chronic atrial fibrillation ([place])    a. Refuses [place].  CHA2DS2VASc = 4.   Chronic systolic CHF (congestive heart failure) ([employer])    a. 05/2015 Echo: EF 35-40%.   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 06/2013 MV: low risk study w/o ischemia;  b. 05/2015 Echo: EF 35-40%, diff HK, basal-midinferoseptal and basal-midanteroseptal AK. Mild-mod MR, mod dil LA, mild to mod TR, PASP 59mmHg.   PAD (peripheral artery disease) ([place])    Pneumonia    30 yrs. ago   Renal artery stenosis (HCC)    s/p left renal artery stent 02/18/21    Past Surgical History:  Procedure Laterality Date   ABDOMINAL AORTOGRAM W/LOWER EXTREMITY Bilateral 12/06/2019   Procedure: ABDOMINAL AORTOGRAM W/LOWER EXTREMITY;  Surgeon: [name], MD;  Location: [facility];  Service: Cardiovascular;  Laterality: Bilateral;   ABDOMINAL AORTOGRAM W/LOWER EXTREMITY Bilateral 05/15/2020   Procedure: ABDOMINAL AORTOGRAM W/LOWER EXTREMITY;  Surgeon: [name], MD;  Location: [facility];  Service: Cardiovascular;  Laterality: Bilateral;   ABDOMINAL AORTOGRAM W/LOWER EXTREMITY N/A 02/18/2021   Procedure: ABDOMINAL AORTOGRAM W/LOWER EXTREMITY;  Surgeon: Serafina Mitchell, MD;  Location: [facility];  Service: Cardiovascular;  Laterality: N/A;   ABDOMINAL HYSTERECTOMY     APPLICATION OF WOUND VAC  01/10/2020   BLEEDING FROM FEMORAL BYPASS   BIOPSY  04/08/2020   Procedure: BIOPSY;  Surgeon: Jackquline Denmark, MD;  Location: [facility] ENDOSCOPY;  Service: Endoscopy;;   CARDIOVERSION     COLONOSCOPY WITH PROPOFOL N/A 04/08/2020   Procedure: COLONOSCOPY WITH PROPOFOL;  Surgeon: Jackquline Denmark, MD;  Location: [facility] ENDOSCOPY;  Service: Endoscopy;  Laterality: N/A;   CYST EXCISION     removed on right ovary   CYSTECTOMY     ENDARTERECTOMY FEMORAL Left 06/20/2020   Procedure: LEFT COMMON FEMORAL ENDARTERECTOMY LEFT ABOVE KNEE POPLITEAL ENDARTERECTOMY;  Surgeon: [name], MD;  Location: [place];  Service: Vascular;  Laterality: Left;    ENDARTERECTOMY FEMORAL Left 02/24/2021   Procedure: REDO LEFT COMMON FEMORAL ENDARTERECTOMY AND PROFUNDAPLASTY [place];  Surgeon: [name], MD;  Location: [facility] OR;  Service: Vascular;  Laterality: Left;   ENTEROSCOPY N/A 03/13/2021   Procedure: ENTEROSCOPY;  Surgeon: Jerene Bears, MD;  Location: [place];  Service: Gastroenterology;  Laterality: N/A;   ESOPHAGOGASTRODUODENOSCOPY (EGD) WITH PROPOFOL N/A 04/08/2020   Procedure: ESOPHAGOGASTRODUODENOSCOPY (EGD) WITH PROPOFOL;  Surgeon: Jackquline Denmark, MD;  Location: [facility] ENDOSCOPY;  Service: Endoscopy;  Laterality: N/A;   ESOPHAGOGASTRODUODENOSCOPY (EGD) WITH PROPOFOL N/A 03/11/2021   Procedure: ESOPHAGOGASTRODUODENOSCOPY (EGD) WITH PROPOFOL;  Surgeon: Jerene Bears, MD;  Location: [facility] ENDOSCOPY;  Service: Gastroenterology;  Laterality: N/A;   FEMORAL ARTERY - POPLITEAL ARTERY BYPASS GRAFT  12/15/2019   Right common femoral artery to below-knee popliteal artery bypass with ipsilateral nonreversed great saphenous vein   FEMORAL-POPLITEAL BYPASS GRAFT Right 12/15/2019   Procedure: BYPASS GRAFT FEMORAL-POPLITEAL ARTERY;  Surgeon: [name], MD;  Location[facility] OR;  Service: Vascular;  Laterality: Right;   FEMORAL-POPLITEAL BYPASS GRAFT Left 06/20/2020   Procedure: BYPASS GRAFT FEMORAL TO ABOVE KNEE POPLITEAL ARTERY WITH HARVENSTED NON REVERSED GREATER SAPHENOUS VEIN;  Surgeon: [name], MD;  Location: [place];  Service: Vascular;  Laterality: Left;   FEMORAL-POPLITEAL BYPASS GRAFT Left 02/24/2021   Procedure: REDO LEFT COMMON FEMORAL ARTERY EXPOSURE AND LEFT COMMON FEMORAL ARTERY TO BELOW KNEE POPLITEAL ARTERY BYPASS GRAFT WITH PROPATEN VASCULAR GRAFT;  Surgeon: [name], MD;  Location: [place];  Service: Vascular;  Laterality: Left;   GIVENS CAPSULE STUDY N/A 03/11/2021   Procedure: GIVENS CAPSULE STUDY;  Surgeon: Jerene Bears, MD;  Location: [place];  Service: Gastroenterology;  Laterality: N/A;   GIVENS  CAPSULE STUDY N/A 03/13/2021   Procedure: GIVENS CAPSULE STUDY;  Surgeon: Jerene Bears, MD;  Location: [place];  Service: Gastroenterology;  Laterality: N/A;   HOT HEMOSTASIS N/A 03/11/2021   Procedure: HOT HEMOSTASIS (ARGON PLASMA COAGULATION/BICAP);  Surgeon: Jerene Bears, MD;  Location: [facility] ENDOSCOPY;  Service: Gastroenterology;  Laterality: N/A;   HYSTEROTOMY     PERIPHERAL VASCULAR INTERVENTION Right 12/06/2019   Procedure: PERIPHERAL VASCULAR INTERVENTION;  Surgeon: [name], MD;  Location: [place] [facility];  Service: Cardiovascular;  Laterality: Right;  EXT ILIAC   PERIPHERAL VASCULAR INTERVENTION Left 02/18/2021   Procedure: PERIPHERAL VASCULAR INTERVENTION;  Surgeon: Serafina Mitchell, MD;  Location: [place] [facility];  Service: Cardiovascular;  Laterality: Left;  renal artery   POLYPECTOMY  04/08/2020   Procedure: POLYPECTOMY;  Surgeon: Jackquline Denmark, MD;  Location: [facility] ENDOSCOPY;  Service: Endoscopy;;   WOUND EXPLORATION Right 01/10/2020   Procedure: right lower leg Wound Exploration with wound vac application;  Surgeon: Serafina Mitchell, MD;  Location[facility] OR;  Service: Vascular;  Laterality: Right;    Social History   Socioeconomic History   Marital status: Single    Spouse name:  Not on file   Number of children: Not on file   Years of education: Not on file   Highest education level: Not on file  Occupational History   Not on file  Tobacco Use   Smoking status: Former    Types: Cigarettes    Quit date: 01/01/1984    Years since quitting: 38.0   Smokeless tobacco: Never  Vaping Use   Vaping Use: Never used  Substance and Sexual Activity   Alcohol use: No   Drug use: Never   Sexual activity: Not on file  Other Topics Concern   Not on file  Social History Narrative   Not on file   Social Determinants of Health   Financial Resource Strain: Not on file  Food Insecurity: Not on file  Transportation Needs: Not on file  Physical Activity: Not on file   Stress: Not on file  Social Connections: Not on file  Intimate Partner Violence: Not on file    Family History  Problem Relation Age of Onset   Cancer - Colon Mother        mast to lungs   Diabetes Father    Diabetes Paternal Grandmother     Current Outpatient Medications  Medication Sig Dispense Refill   colchicine 0.6 MG tablet Take 0.6 mg by mouth 2 (two) times daily.     diclofenac Sodium (VOLTAREN) 1 % GEL SMARTSIG:1 Gram(s) Topical 4 Times Daily PRN     digoxin (LANOXIN) 0.125 MG tablet Take 0.5 tablets (0.0625 mg total) by mouth every other day. 45 tablet 1   Dulaglutide 3 MG/0.5ML SOPN Inject 3 mg into the skin once a week.     furosemide (LASIX) 20 MG tablet TAKE 2 TABLETS BY MOUTH IN THE MORNING AND AN EXTRA TABLET IF GAIN 3 OR MORE POUNDS IN 1 DAY (Patient taking differently: TAKE 2 TABLETS BY MOUTH IN THE MORNING AND AN EXTRA TABLET IF GAIN 3 OR MORE POUNDS IN 1 DAY Pt states 1 tab every morning) 270 tablet 1   glipiZIDE (GLUCOTROL XL) 10 MG 24 hr tablet Take 10 mg by mouth 2 (two) times daily.      iron polysaccharides (NIFEREX) 150 MG capsule Take 150 mg by mouth daily.     metoprolol (TOPROL-XL) 200 MG 24 hr tablet TAKE 1 TABLET BY MOUTH EVERY DAY 90 tablet 0   pantoprazole (PROTONIX) 40 MG tablet Take 40 mg by mouth daily.     sitaGLIPtin (JANUVIA) 50 MG tablet Take 50 mg by mouth daily.     SYNTHROID 100 MCG tablet Take 100 mcg by mouth daily.     cetirizine (ZYRTEC) 10 MG tablet Take 10 mg by mouth daily. (Patient not taking: Reported on 12/23/2021)     clorazepate (TRANXENE-T) 7.5 MG tablet Take 1 tablet (7.5 mg total) by mouth 2 (two) times daily as needed for anxiety. 30 tablet 1   nilotinib (TASIGNA) 150 MG capsule Take 2 capsules (300 mg total) by mouth every 12 (twelve) hours. Take on an empty stomach, 1 hour before or 2 hours after meals. (Patient not taking: Reported on 12/23/2021) 120 capsule 0   rosuvastatin (CRESTOR) 5 MG tablet Take 2 tablets (10 mg total)  by mouth daily. 30 tablet 11   spironolactone (ALDACTONE) 25 MG tablet Take 0.5 tablets (12.5 mg total) by mouth 2 (two) times daily. (Patient not taking: Reported on 12/23/2021) 90 tablet 3   sucralfate (CARAFATE) 1 g tablet Take 1 g by mouth 4 (four) times  daily. (Patient not taking: Reported on 12/23/2021)     No current facility-administered medications for this visit.    Allergies  Allergen Reactions   Lisinopril Cough   Losartan Other (See Comments)    Patient reports nightmares/vivid dreams when taking.     REVIEW OF SYSTEMS:  [X]  denotes positive finding, [ ]  denotes negative finding Cardiac  Comments:  Chest pain or chest pressure:    Shortness of breath upon exertion:    Short of breath when lying flat:    Irregular heart rhythm:        Vascular    Pain in calf, thigh, or hip brought on by ambulation:    Pain in feet at night that wakes you up from your sleep:     Blood clot in your veins:    Leg swelling:         Pulmonary    Oxygen at home:    Productive cough:     Wheezing:         Neurologic    Sudden weakness in arms or legs:     Sudden numbness in arms or legs:     Sudden onset of difficulty speaking or slurred speech:    Temporary loss of vision in one eye:     Problems with dizziness:         Gastrointestinal    Blood in stool:     Vomited blood:         Genitourinary    Burning when urinating:     Blood in urine:        Psychiatric    Major depression:         Hematologic    Bleeding problems:    Problems with blood clotting too easily:        Skin    Rashes or ulcers:        Constitutional    Fever or chills:      PHYSICAL EXAMINATION:  Vitals:   12/23/21 1223  BP: 103/73  Pulse: 88  Temp: (!) 97.3 F (36.3 C)  Weight: 192 lb 11.2 oz (87.4 kg)  Height: 5\' 8"  (1.727 m)    General:  WDWN in NAD; vital signs documented above Gait: Normal HENT: WNL, normocephalic Pulmonary: normal non-labored breathing , without  wheezing Cardiac: regular HR, without  Murmurs without carotid bruit Abdomen: obese, soft, NT, no masses Vascular Exam/Pulses:  Right Left  Radial 2+ (normal) 2+ (normal)  Femoral 2+ (normal) 2+ (normal)  Popliteal absent absent  DP 2+ (normal) absent  PT absent absent   Extremities: without ischemic changes, without Gangrene , without cellulitis; without open wounds;  Musculoskeletal: no muscle wasting or atrophy  Neurologic: A&O X 3;  No focal weakness or paresthesias are detected Psychiatric:  The pt has Normal affect.   Non-Invasive Vascular Imaging:   +-------+-----------+------------+------------+------------+   ABI/TBI Today's ABI Today's TBI  Previous ABI Previous TBI   +-------+-----------+------------+------------+------------+   Right   0.84        not obtained 0.90         0.70           +-------+-----------+------------+------------+------------+   Left    absent      absent       0.30         0.18           +-------+-----------+------------+------------+------------+        Right Graft #1:  +------------------+--------+---------------+--------+--------+  PSV cm/s Stenosis        Waveform Comments   +------------------+--------+---------------+--------+--------+   Inflow             353      75-99% stenosis biphasic            +------------------+--------+---------------+--------+--------+   Prox Anastomosis   375      >70% stenosis   biphasic            +------------------+--------+---------------+--------+--------+   Proximal Graft     55                       biphasic            +------------------+--------+---------------+--------+--------+   Mid Graft          42                       biphasic            +------------------+--------+---------------+--------+--------+   Distal Graft       80                       biphasic            +------------------+--------+---------------+--------+--------+   Distal Anastomosis 74                        biphasic            +------------------+--------+---------------+--------+--------+   Outflow            118                      biphasic            +------------------+--------+---------------+--------+--------+      Left Graft #1:  +--------------------+--------+--------+----------+--------+                        PSV cm/s Stenosis Waveform   Comments   +--------------------+--------+--------+----------+--------+   Inflow               102               monophasic            +--------------------+--------+--------+----------+--------+   Proximal Anastomosis 470               monophasic            +--------------------+--------+--------+----------+--------+   Proximal Graft                occluded                       +--------------------+--------+--------+----------+--------+   Mid Graft                     occluded                       +--------------------+--------+--------+----------+--------+   Distal Graft                  occluded                       +--------------------+--------+--------+----------+--------+   Distal Anastomosis            occluded                       +--------------------+--------+--------+----------+--------+  Outflow                       occluded                       +--------------------+--------+--------+----------+--------+   Summary:  Right: Patent right femoral to popliteal bypass graft with increased velocity in the inflow (75 - 99% stenosis range) and proximal anastomosis (>70% stenosis range, probable lower end of range).   Left: Occluded left femoral to below knee bypass graft.    ASSESSMENT/PLAN:: 81 y.o. female here for follow up for PAD. Her past surgical history includes most recently redo left common femoral endarterectomy and left common femoral to BK pop bypass PTFE on 02/24/2021 for heel ulceration. Prior history of occluded left common femoral artery to AK popliteal artery bypass 06/20/20 by Dr. Carlis Abbott. Left renal artery stenting  by Dr. Trula Slade on 02/18/21 with history of CKD III. She has history of right common femoral to below knee popliteal artery vein bypass 12/15/19 and prior to that she had right ext. Iliac artery angioplasty and stenting on 12/06/19 by Dr. Carlis Abbott. 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 She currently is asymptomatic. Her duplex today shows that her most recent left femoral to BK popliteal bypass is now occluded. Right lower extremity bypass is patent but elevate velocities are present at the proximal anastomosis. ABI is decreased on the right with absent toe pressure and absent entirely on the left. This is new from prior study. Additionally we did an ultrasound of her left renal stent. There are some elevated velocities within her stent as well. Presently her blood pressure is well controlled and most recent Scr is at baseline. No intervention is indicated at this time. - Discussed ultrasound and ABI findings today with Dr. Carlis Abbott. He recommends CTA for further evaluation of bilateral lower extremities. I will arranged this over next couple weeks with follow up with Dr. Carlis Abbott to review the studies with the patient. - continue activity as tolerated - Continue statin - Will have repeat renal artery duplex in 3 months    Karoline Caldwell, PA-C [facility] [phone]  Clinic MD: Roxanne Mins

## 2021-12-24 ENCOUNTER — Other Ambulatory Visit: Payer: Self-pay

## 2021-12-24 DIAGNOSIS — I739 Peripheral vascular disease, unspecified: Secondary | ICD-10-CM

## 2021-12-25 DIAGNOSIS — N184 Chronic kidney disease, stage 4 (severe): Secondary | ICD-10-CM | POA: Diagnosis not present

## 2021-12-30 ENCOUNTER — Telehealth: Payer: Self-pay

## 2021-12-30 ENCOUNTER — Other Ambulatory Visit: Payer: Self-pay | Admitting: *Deleted

## 2021-12-30 DIAGNOSIS — I701 Atherosclerosis of renal artery: Secondary | ICD-10-CM

## 2021-12-30 NOTE — Telephone Encounter (Signed)
Returned call to pt - Pt states that he is having pain in L heel states that it has increased slightly since she was last seen on 12/23/21. Pt states she had not been taking any pain medicine but would take some OTC tylenol if pain persist. At pt 's request I attempted to move CT appt up but was unsuccessful. I suggested that she have her daughter to check her heel daily for changes, Advised that if pain continues to increase or her foot starts to feel cool, color changes or any signs of infection occur to give our office a call or go to the nearest ER. Pt verbalized understanding and agreed with this plan.

## 2022-01-02 DIAGNOSIS — I701 Atherosclerosis of renal artery: Secondary | ICD-10-CM | POA: Diagnosis not present

## 2022-01-02 DIAGNOSIS — D631 Anemia in chronic kidney disease: Secondary | ICD-10-CM | POA: Diagnosis not present

## 2022-01-02 DIAGNOSIS — R609 Edema, unspecified: Secondary | ICD-10-CM | POA: Diagnosis not present

## 2022-01-02 DIAGNOSIS — N183 Chronic kidney disease, stage 3 unspecified: Secondary | ICD-10-CM | POA: Diagnosis not present

## 2022-01-02 DIAGNOSIS — N2581 Secondary hyperparathyroidism of renal origin: Secondary | ICD-10-CM | POA: Diagnosis not present

## 2022-01-02 DIAGNOSIS — I129 Hypertensive chronic kidney disease with stage 1 through stage 4 chronic kidney disease, or unspecified chronic kidney disease: Secondary | ICD-10-CM | POA: Diagnosis not present

## 2022-01-09 ENCOUNTER — Other Ambulatory Visit: Payer: Self-pay | Admitting: Cardiovascular Disease

## 2022-01-14 ENCOUNTER — Other Ambulatory Visit: Payer: PPO

## 2022-01-14 ENCOUNTER — Ambulatory Visit
Admission: RE | Admit: 2022-01-14 | Discharge: 2022-01-14 | Disposition: A | Discharge status: Home / Self Care | Payer: PPO | Source: Ambulatory Visit | Attending: Vascular Surgery | Admitting: Vascular Surgery

## 2022-01-14 DIAGNOSIS — K7460 Unspecified cirrhosis of liver: Secondary | ICD-10-CM | POA: Diagnosis not present

## 2022-01-14 DIAGNOSIS — I739 Peripheral vascular disease, unspecified: Secondary | ICD-10-CM | POA: Diagnosis not present

## 2022-01-14 DIAGNOSIS — N280 Ischemia and infarction of kidney: Secondary | ICD-10-CM | POA: Diagnosis not present

## 2022-01-14 DIAGNOSIS — N261 Atrophy of kidney (terminal): Secondary | ICD-10-CM | POA: Diagnosis not present

## 2022-01-14 IMAGING — CT CT ANGIO AOBIFEM WO/W CM
1 of 2 series · 4 of 16 positions shown, 5 images · non-contrast
Comparison: CT abdomen pelvis 07/04/2016

CLINICAL DATA: Peripheral arterial disease

Fem-pop bypass graft
Leukemia
Hysterectomy
EXAM:
CT ANGIOGRAPHY OF ABDOMINAL AORTA WITH ILIOFEMORAL RUNOFF
TECHNIQUE: Multidetector CT imaging of the abdomen, pelvis and lower
extremities was performed using the standard protocol during bolus
administration of intravenous contrast. Multiplanar CT image
reconstructions and MIPs were obtained to evaluate the vascular
anatomy.

[Series 13: cta whole body 2.00 bv36 s3 cor legs mip · coronal · 0.80mm/px · 4 of 205 slices shown, 5 images]
[im 30/205  soft-tissue]
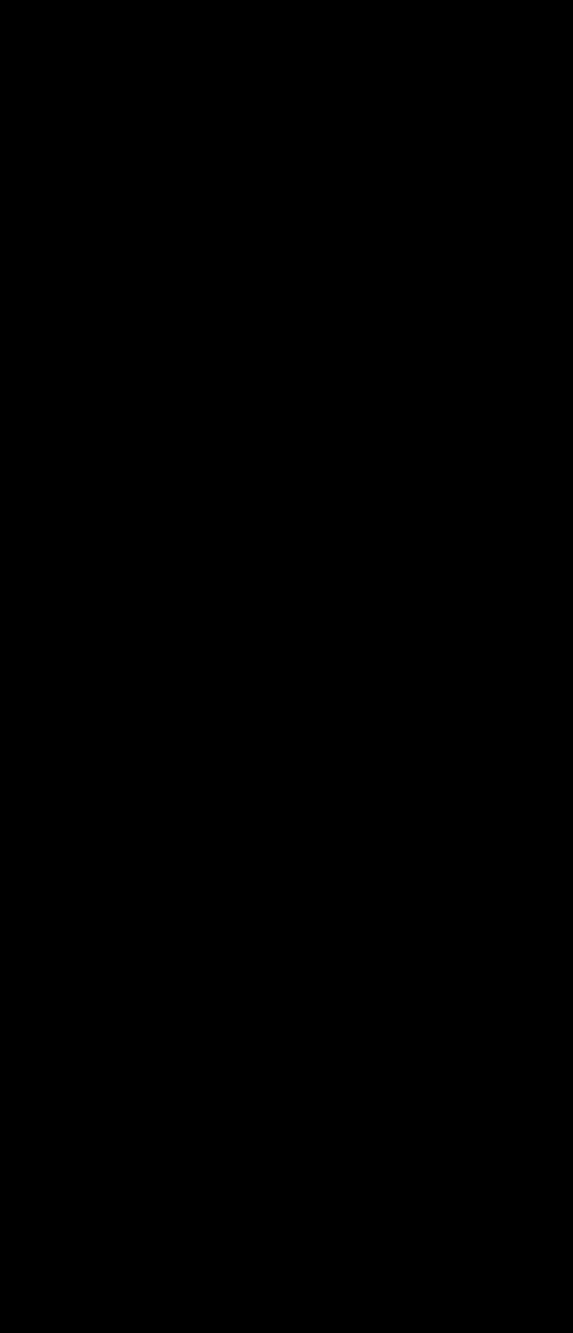
[im 30/205  bone]
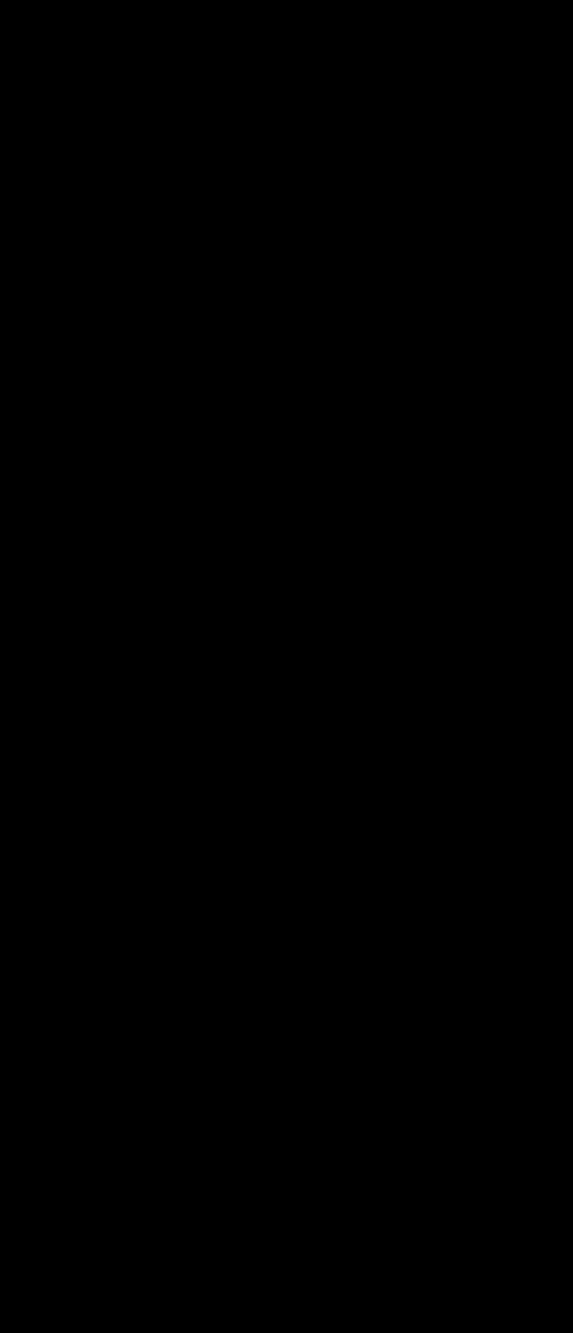
[im 88/205  soft-tissue]
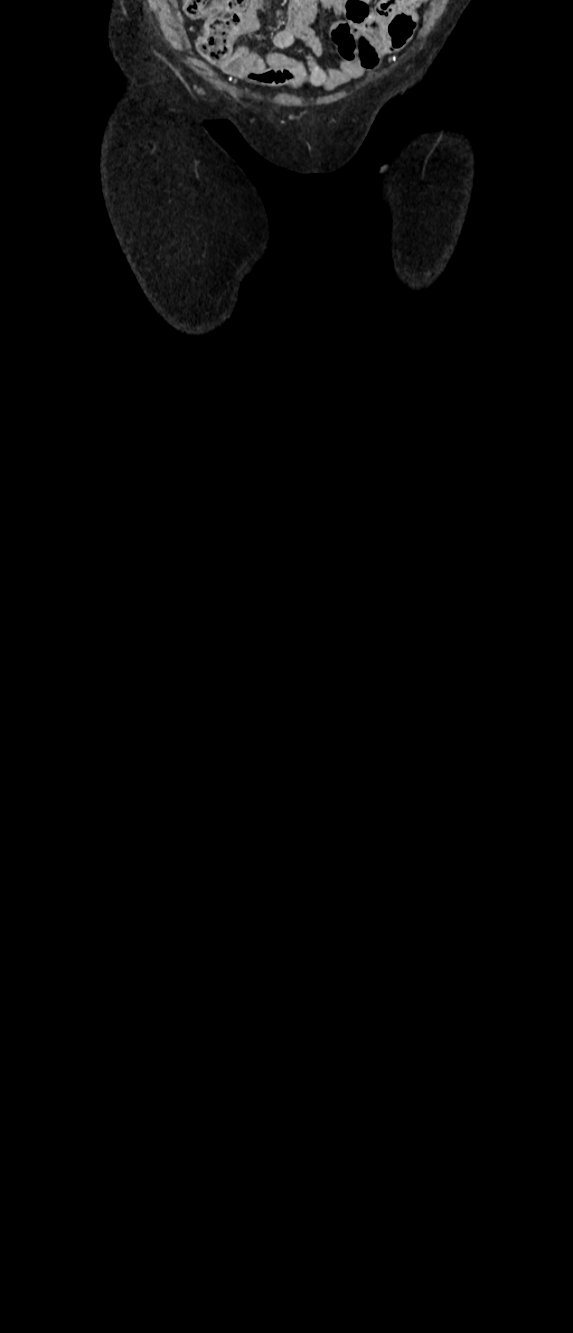
[im 117/205  soft-tissue]
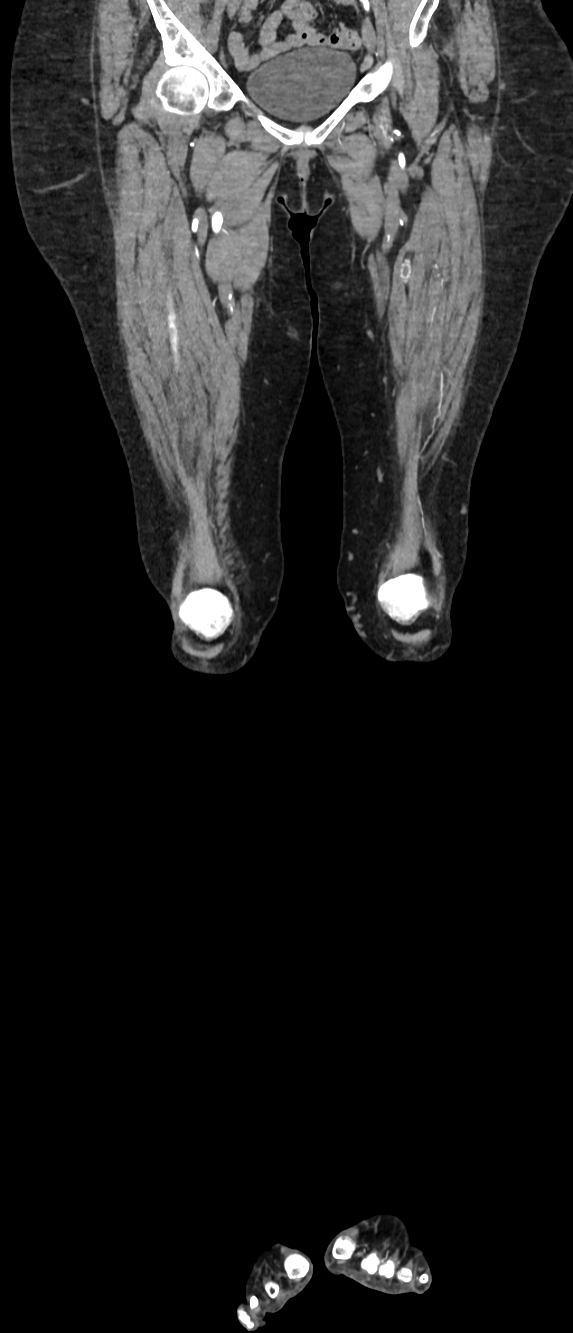
[im 175/205  soft-tissue]
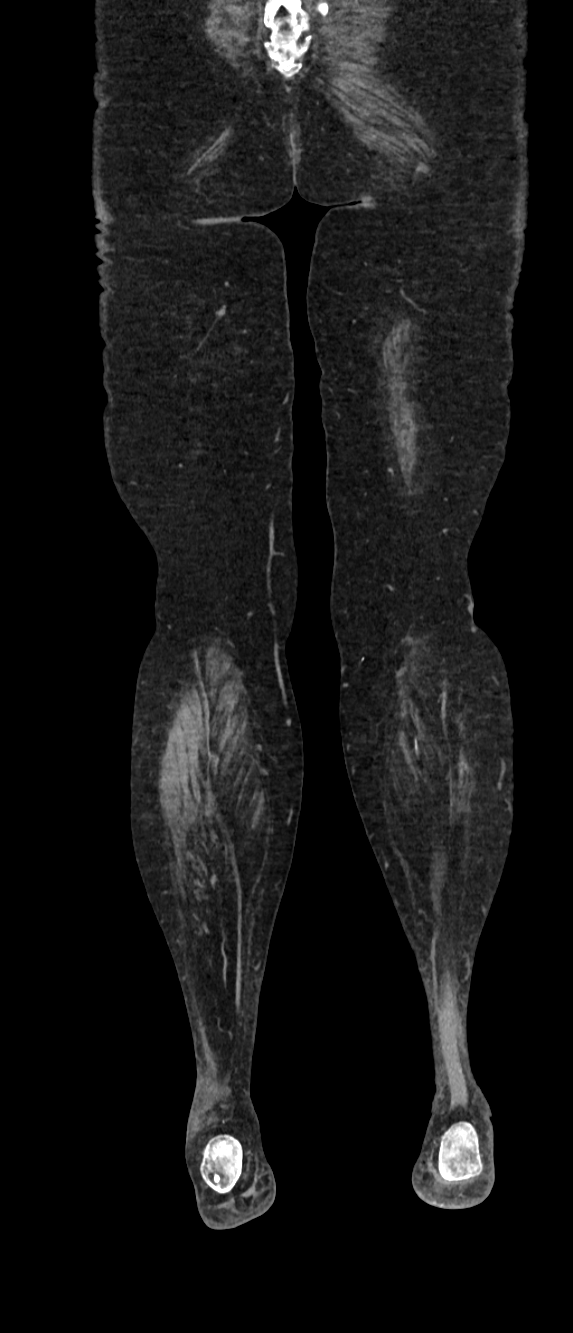

[4 of 16 positions shown; findings below may reference images not displayed]

RADIATION DOSE REDUCTION: This exam was performed according to the
departmental dose-optimization program which includes automated
exposure control, adjustment of the mA and/or kV according to
patient size and/or use of iterative reconstruction technique.

CONTRAST:  80mL CA9ELG-MS4 IOPAMIDOL (CA9ELG-MS4) INJECTION 76%
FINDINGS: VASCULAR

Aorta: Scattered atheromatous plaque of the aorta without aneurysm
or flow-limiting stenosis.

Celiac: Severe stenosis of the origin.

SMA: No significant stenosis.

Renals: Near complete occlusion at the origin of right main renal
artery.Left main renal artery stent is patent with mild intimal
hyperplasia.

IMA: No significant stenosis.

RIGHT Lower Extremity

Inflow: No significant stenosis of the common iliac artery. Mild
stenosis at the origin of the right internal iliac artery. No
significant stenosis of the stented right external iliac artery.

Outflow: Approximately 50% focal stenosis at the proximal common
femoral artery which is otherwise patent. Profundus femoris artery
is patent. Native right SFA is occluded. Right fem-pop bypass graft
is patent. The popliteal artery is diminutive but patent.

Runoff: No significant stenosis of the tibioperoneal trunk. Two
vessel runoff to the right ankle through the anterior and posterior
tibial arteries. Peroneal artery opacifies to the mid lower leg.

LEFT Lower Extremity

Inflow: Mild diffuse narrowing of the common iliac artery
predominantly due to noncalcified plaque. No significant stenosis of
the internal iliac artery. Mild diffuse narrowing of the external
iliac artery.

Outflow: Short segment occlusion of the proximal common femoral
artery. There is reconstitution of flow within a thread-like mid and
distal CFA. Profunda femoris artery is patent. The native left SFA
and fem-pop bypass grafts are occluded. The popliteal artery is
diminutive but patent.

Runoff: Tibioperoneal trunk is patent. Three-vessel runoff to the
left ankle.

Veins: No obvious venous abnormality within the limitations of this
arterial phase study.

Review of the MIP images confirms the above findings.

NON-VASCULAR

Lower chest: No acute abnormality.  The heart is mildly enlarged.

Hepatobiliary: Nodular hepatic contours indicative of cirrhosis.
Small amount of sludge and stones noted within the gallbladder
lumen.

Pancreas: Unremarkable. No pancreatic ductal dilatation or
surrounding inflammatory changes.

Spleen: Normal in size without focal abnormality.

Adrenals/Urinary Tract: Adrenal glands are normal.

There has been interval atrophy of the right kidney. There is
asymmetrically decreased perfusion of the right kidney on the
current examination. Left kidney is normal in appearance.

Enhancing mass is noted in the right posterior bladder wall
measuring 1.3 x 1.0 x 0.9 cm. This requires further evaluation with
cystoscopy.

Stomach/Bowel: No bowel dilatation to indicate ileus or obstruction.
Scattered colonic diverticula. Appendix not definitively identified.

Lymphatic: No enlarged abdominal or pelvic lymph nodes.

Reproductive: Postsurgical changes of hysterectomy are seen.

Other: Numerous cutaneous lesions seen in the anterior abdominal
wall.

Musculoskeletal: No acute or significant osseous findings.
IMPRESSION: VASCULAR

1. Occluded right SFA.  Right fem-pop bypass graft is patent.
2. Occluded left common and superficial femoral arteries. Left
fem-pop bypass graft is occluded. Flow is present within diminutive
left popliteal artery with three-vessel runoff to the left ankle.
3. Severe stenosis at the origin of the celiac artery.
4. Severe stenosis at the origin of the right main renal artery.
5. Stented left main renal artery is patent with mild intimal
hyperplasia.

NON-VASCULAR

1. 1.3 x 1.0 x 0.9 cm enhancing mass noted in the right posterior
bladder wall requires further evaluation with cystoscopy.
2. Cirrhotic liver morphology.
3. Asymmetrically atrophic right kidney likely due to severely
narrowed right main renal artery.

## 2022-01-14 MED ORDER — IOPAMIDOL (ISOVUE-370) INJECTION 76%
80.0000 mL | Freq: Once | INTRAVENOUS | Status: AC | PRN
  Administered 2022-01-14: 80 mL via INTRAVENOUS

## 2022-01-15 ENCOUNTER — Telehealth: Payer: Self-pay | Admitting: *Deleted

## 2022-01-15 NOTE — Telephone Encounter (Signed)
Patient call she states she had her CT yesterday and wanted to see if Dr Carlis Abbott had seen it. I informed patient Dr Carlis Abbott will review results with her at her upcoming appt on 01/20/22. She states her foot is really hurting but she is taking something for pain. Advised patient to elevate her leg and to keep her upcoming appt. Patient verbalized understanding.

## 2022-01-20 ENCOUNTER — Encounter: Payer: Self-pay | Admitting: Vascular Surgery

## 2022-01-20 ENCOUNTER — Other Ambulatory Visit: Payer: Self-pay

## 2022-01-20 ENCOUNTER — Ambulatory Visit (INDEPENDENT_AMBULATORY_CARE_PROVIDER_SITE_OTHER): Payer: PPO | Admitting: Vascular Surgery

## 2022-01-20 VITALS — BP 128/75 | HR 102 | Temp 97.3°F | Resp 14 | Ht 68.0 in | Wt 192.0 lb

## 2022-01-20 DIAGNOSIS — T82898D Other specified complication of vascular prosthetic devices, implants and grafts, subsequent encounter: Secondary | ICD-10-CM

## 2022-01-20 DIAGNOSIS — I70229 Atherosclerosis of native arteries of extremities with rest pain, unspecified extremity: Secondary | ICD-10-CM

## 2022-01-20 DIAGNOSIS — I739 Peripheral vascular disease, unspecified: Secondary | ICD-10-CM | POA: Diagnosis not present

## 2022-01-20 MED ORDER — OXYCODONE-ACETAMINOPHEN 5-325 MG PO TABS: 1.0000 | ORAL_TABLET | ORAL | 0 refills | Status: DC | PRN

## 2022-01-20 MED ORDER — AMOXICILLIN-POT CLAVULANATE 875-125 MG PO TABS: 1.0000 | ORAL_TABLET | Freq: Two times a day (BID) | ORAL | 0 refills | Status: DC

## 2022-01-20 NOTE — Progress Notes (Signed)
Patient name: Sara Warner MRN: [id number] DOB: February 23, 1941 Sex: female  REASON FOR VISIT: F/U after CTA  HPI: Sara Warner is a 81 y.o. female multiple medical comorbidities including CKD, atrial fibrillation not on anticoagulation, DM, HTN, CHF (last EF 45-50%), PAD who presents to discuss results of CT scan.  She has developed a left fifth toe wound and left heel ulcer since her last office visit.    Her most recent left leg bypass is known to be occluded.  Most recently she underwent redo left common femoral endarterectomy with a left common femoral to BK popliteal bypass with PTFE on 02/24/2021 for an occluded left common femoral to above-knee popliteal bypass with vein.   Prior to this on 06/20/2020 she had left common femoral endarterectomy with bovine patch and a left common femoral to above-knee pop bypass also with vein.  On 12/06/2019 she had a right external iliac stent and then on 12/15/2019 had a right common femoral to below-knee pop bypass with vein for CLI with tissue loss.   Past Medical History:  Diagnosis Date   Atrial fibrillation ([place])    Chronic atrial fibrillation ([place])    a. Refuses [place].  CHA2DS2VASc = 4.   Chronic systolic CHF (congestive heart failure) ([place])    a. 05/2015 Echo: EF 35-40%.   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 06/2013 MV: low risk study w/o ischemia;  b. 05/2015 Echo: EF 35-40%, diff HK, basal-midinferoseptal and basal-midanteroseptal AK. Mild-mod MR, mod dil LA, mild to mod TR, PASP 75mmHg.   PAD (peripheral artery disease) ([place])    Pneumonia    30 yrs. ago   Renal artery stenosis (HCC)    s/p left renal artery stent 02/18/21    Past Surgical History:   Procedure Laterality Date   ABDOMINAL AORTOGRAM W/LOWER EXTREMITY Bilateral 12/06/2019   Procedure: ABDOMINAL AORTOGRAM W/LOWER EXTREMITY;  Surgeon: [name], MD;  Location: [place] [facility];  Service: Cardiovascular;  Laterality: Bilateral;   ABDOMINAL AORTOGRAM W/LOWER EXTREMITY Bilateral 05/15/2020   Procedure: ABDOMINAL AORTOGRAM W/LOWER EXTREMITY;  Surgeon: [name], MD;  Location: [place] [facility];  Service: Cardiovascular;  Laterality: Bilateral;   ABDOMINAL AORTOGRAM W/LOWER EXTREMITY N/A 02/18/2021   Procedure: ABDOMINAL AORTOGRAM W/LOWER EXTREMITY;  Surgeon: Serafina Mitchell, MD;  Location: [place] [facility];  Service: Cardiovascular;  Laterality: N/A;   ABDOMINAL HYSTERECTOMY     APPLICATION OF WOUND VAC  01/10/2020   BLEEDING FROM FEMORAL BYPASS   BIOPSY  04/08/2020   Procedure: BIOPSY;  Surgeon: Jackquline Denmark, MD;  Location: [place];  Service: Endoscopy;;   CARDIOVERSION     COLONOSCOPY WITH PROPOFOL N/A 04/08/2020   Procedure: COLONOSCOPY WITH PROPOFOL;  Surgeon: Jackquline Denmark, MD;  Location: [facility] ENDOSCOPY;  Service: Endoscopy;  Laterality: N/A;   CYST EXCISION     removed on right ovary   CYSTECTOMY     ENDARTERECTOMY FEMORAL Left 06/20/2020   Procedure: LEFT COMMON FEMORAL ENDARTERECTOMY LEFT ABOVE KNEE POPLITEAL ENDARTERECTOMY;  Surgeon: [name], MD;  Location: [place];  Service: Vascular;  Laterality: Left;   ENDARTERECTOMY FEMORAL Left 02/24/2021   Procedure: REDO LEFT  COMMON FEMORAL ENDARTERECTOMY AND PROFUNDAPLASTY WTH BOVINE PATCH;  Surgeon: [name], MD;  Location: [facility] OR;  Service: Vascular;  Laterality: Left;   ENTEROSCOPY N/A 03/13/2021   Procedure: ENTEROSCOPY;  Surgeon: Jerene Bears, MD;  Location: [place];  Service: Gastroenterology;  Laterality: N/A;   ESOPHAGOGASTRODUODENOSCOPY (EGD) WITH PROPOFOL N/A 04/08/2020   Procedure: ESOPHAGOGASTRODUODENOSCOPY (EGD) WITH PROPOFOL;  Surgeon: Jackquline Denmark, MD;   Location: [facility] ENDOSCOPY;  Service: Endoscopy;  Laterality: N/A;   ESOPHAGOGASTRODUODENOSCOPY (EGD) WITH PROPOFOL N/A 03/11/2021   Procedure: ESOPHAGOGASTRODUODENOSCOPY (EGD) WITH PROPOFOL;  Surgeon: Jerene Bears, MD;  Location[facility] ENDOSCOPY;  Service: Gastroenterology;  Laterality: N/A;   FEMORAL ARTERY - POPLITEAL ARTERY BYPASS GRAFT  12/15/2019   Right common femoral artery to below-knee popliteal artery bypass with ipsilateral nonreversed great saphenous vein   FEMORAL-POPLITEAL BYPASS GRAFT Right 12/15/2019   Procedure: BYPASS GRAFT FEMORAL-POPLITEAL ARTERY;  Surgeon: [name], MD;  Location: [facility] OR;  Service: Vascular;  Laterality: Right;   FEMORAL-POPLITEAL BYPASS GRAFT Left 06/20/2020   Procedure: BYPASS GRAFT FEMORAL TO ABOVE KNEE POPLITEAL ARTERY WITH HARVENSTED NON REVERSED GREATER SAPHENOUS VEIN;  Surgeon: [name], MD;  Location: [place];  Service: Vascular;  Laterality: Left;   FEMORAL-POPLITEAL BYPASS GRAFT Left 02/24/2021   Procedure: REDO LEFT COMMON FEMORAL ARTERY EXPOSURE AND LEFT COMMON FEMORAL ARTERY TO BELOW KNEE POPLITEAL ARTERY BYPASS GRAFT WITH PROPATEN VASCULAR GRAFT;  Surgeon: [name], MD;  Location: [place];  Service: Vascular;  Laterality: Left;   GIVENS CAPSULE STUDY N/A 03/11/2021   Procedure: GIVENS CAPSULE STUDY;  Surgeon: Jerene Bears, MD;  Location: [place];  Service: Gastroenterology;  Laterality: N/A;   GIVENS CAPSULE STUDY N/A 03/13/2021   Procedure: GIVENS CAPSULE STUDY;  Surgeon: Jerene Bears, MD;  Location: [place];  Service: Gastroenterology;  Laterality: N/A;   HOT HEMOSTASIS N/A 03/11/2021   Procedure: HOT HEMOSTASIS (ARGON PLASMA COAGULATION/BICAP);  Surgeon: Jerene Bears, MD;  Location: [facility] ENDOSCOPY;  Service: Gastroenterology;  Laterality: N/A;   HYSTEROTOMY     PERIPHERAL VASCULAR INTERVENTION Right 12/06/2019   Procedure: PERIPHERAL VASCULAR INTERVENTION;  Surgeon: [name], MD;  Location: [employer]  CV LAB;  Service: Cardiovascular;  Laterality: Right;  EXT ILIAC   PERIPHERAL VASCULAR INTERVENTION Left 02/18/2021   Procedure: PERIPHERAL VASCULAR INTERVENTION;  Surgeon: Serafina Mitchell, MD;  Location: [place] CV LAB;  Service: Cardiovascular;  Laterality: Left;  renal artery   POLYPECTOMY  04/08/2020   Procedure: POLYPECTOMY;  Surgeon: Jackquline Denmark, MD;  Location: [facility] ENDOSCOPY;  Service: Endoscopy;;   WOUND EXPLORATION Right 01/10/2020   Procedure: right lower leg Wound Exploration with wound vac application;  Surgeon: Serafina Mitchell, MD;  Location: [facility] OR;  Service: Vascular;  Laterality: Right;    Family History  Problem Relation Age of Onset   Cancer - Colon Mother        mast to lungs   Diabetes Father    Diabetes Paternal Grandmother     SOCIAL HISTORY: Social History   Tobacco Use   Smoking status: Former    Types: Cigarettes    Quit date: 01/01/1984    Years since quitting: 38.0   Smokeless tobacco: Never  Substance Use Topics   Alcohol use: No    Allergies  Allergen Reactions   Lisinopril Cough   Losartan Other (See Comments)    Patient reports nightmares/vivid dreams when taking.    Current Outpatient Medications  Medication Sig Dispense Refill  cetirizine (ZYRTEC) 10 MG tablet Take 10 mg by mouth daily.     clorazepate (TRANXENE-T) 7.5 MG tablet Take 1 tablet (7.5 mg total) by mouth 2 (two) times daily as needed for anxiety. 30 tablet 1   colchicine 0.6 MG tablet Take 0.6 mg by mouth 2 (two) times daily.     diclofenac Sodium (VOLTAREN) 1 % GEL SMARTSIG:1 Gram(s) Topical 4 Times Daily PRN     digoxin (LANOXIN) 0.125 MG tablet Take 0.5 tablets (0.0625 mg total) by mouth every other day. 45 tablet 1   Dulaglutide 3 MG/0.5ML SOPN Inject 3 mg into the skin once a week.     furosemide (LASIX) 20 MG tablet TAKE 2 TABLETS BY MOUTH IN THE MORNING AND AN EXTRA TABLET IF GAIN 3 OR MORE POUNDS IN 1 DAY 270 tablet 0   glipiZIDE (GLUCOTROL XL) 10 MG 24 hr tablet Take  10 mg by mouth 2 (two) times daily.      iron polysaccharides (NIFEREX) 150 MG capsule Take 150 mg by mouth daily.     metoprolol (TOPROL-XL) 200 MG 24 hr tablet TAKE 1 TABLET BY MOUTH EVERY DAY 90 tablet 0   nilotinib (TASIGNA) 150 MG capsule Take 2 capsules (300 mg total) by mouth every 12 (twelve) hours. Take on an empty stomach, 1 hour before or 2 hours after meals. 120 capsule 0   pantoprazole (PROTONIX) 40 MG tablet Take 40 mg by mouth daily.     rosuvastatin (CRESTOR) 5 MG tablet Take 2 tablets (10 mg total) by mouth daily. 30 tablet 11   sitaGLIPtin (JANUVIA) 50 MG tablet Take 50 mg by mouth daily.     sucralfate (CARAFATE) 1 g tablet Take 1 g by mouth 4 (four) times daily.     SYNTHROID 100 MCG tablet Take 100 mcg by mouth daily.     spironolactone (ALDACTONE) 25 MG tablet Take 0.5 tablets (12.5 mg total) by mouth 2 (two) times daily. (Patient not taking: Reported on 12/23/2021) 90 tablet 3   No current facility-administered medications for this visit.    REVIEW OF SYSTEMS:  [X]  denotes positive finding, [ ]  denotes negative finding Cardiac  Comments:  Chest pain or chest pressure:    Shortness of breath upon exertion:    Short of breath when lying flat:    Irregular heart rhythm:        Vascular    Pain in calf, thigh, or hip brought on by ambulation:    Pain in feet at night that wakes you up from your sleep:  x Left foot   Blood clot in your veins:    Leg swelling:         Pulmonary    Oxygen at home:    Productive cough:     Wheezing:         Neurologic    Sudden weakness in arms or legs:     Sudden numbness in arms or legs:     Sudden onset of difficulty speaking or slurred speech:    Temporary loss of vision in one eye:     Problems with dizziness:         Gastrointestinal    Blood in stool:     Vomited blood:         Genitourinary    Burning when urinating:     Blood in urine:        Psychiatric    Major depression:         Hematologic  Bleeding  problems:    Problems with blood clotting too easily:        Skin    Rashes or ulcers:        Constitutional    Fever or chills:      PHYSICAL EXAM: Vitals:   01/20/22 1240  BP: 128/75  Pulse: (!) 102  Resp: 14  Temp: (!) 97.3 F (36.3 C)  TempSrc: Temporal  SpO2: 94%  Weight: 192 lb (87.1 kg)  Height: 5\' 8"  (1.727 m)    GENERAL: The patient is a well-nourished female, in no acute distress. The vital signs are documented above. CARDIAC: There is a regular rate and rhythm.  VASCULAR:  No palpable left femoral pulse Left foot with a dependent rubor Left heel wound and left fifth toe wound as pictured below          DATA:   None  Assessment/Plan:  81 year old female presents with occluded left common femoral artery as well as occluded left common femoral to below-knee popliteal bypass with PTFE which was a redo bypass last year.  Since her last office visit with the PA she has developed tissue loss of the left fifth toe and also a left heel wound.  I discussed with her and her daughter that this would be a complex revascularization and I think she should strongly consider above-knee amputation given several failed revascularizations in the left leg with fairly poor durability of her options moving forward.  Discussed would require a redo redo left common femoral endarterectomy with retrograde iliac stenting and a redo common femoral to distal bypass.  Ultimately her and her daughter are going to take some time to think about their decision.  They would like to be scheduled for the redo left common femoral endarterectomy with iliac stenting and a redo left leg bypass to attempt limb salvage.  I discussed even with this there would be high risk that her wounds progress.  If this fails will ultimately require above-knee amputation. I discussed risk and benefits.   [name], MD [facility] Office: [phone]

## 2022-01-22 DIAGNOSIS — Z7984 Long term (current) use of oral hypoglycemic drugs: Secondary | ICD-10-CM | POA: Diagnosis not present

## 2022-01-22 DIAGNOSIS — L97429 Non-pressure chronic ulcer of left heel and midfoot with unspecified severity: Secondary | ICD-10-CM | POA: Diagnosis not present

## 2022-01-22 DIAGNOSIS — E11621 Type 2 diabetes mellitus with foot ulcer: Secondary | ICD-10-CM | POA: Diagnosis not present

## 2022-01-22 DIAGNOSIS — E1151 Type 2 diabetes mellitus with diabetic peripheral angiopathy without gangrene: Secondary | ICD-10-CM | POA: Diagnosis not present

## 2022-01-23 ENCOUNTER — Telehealth: Payer: Self-pay

## 2022-01-23 NOTE — Telephone Encounter (Signed)
Patient cancelled her redo left common femoral endarterectomy with iliac stenting and redo left leg bypass surgery on 01/28/22. Says she's going to [facility] to have completed. Dr. Carlis Abbott notified.

## 2022-01-28 ENCOUNTER — Inpatient Hospital Stay (HOSPITAL_COMMUNITY): Admission: RE | Admit: 2022-01-28 | Payer: PPO | Source: Home / Self Care | Admitting: Vascular Surgery

## 2022-01-28 ENCOUNTER — Encounter (HOSPITAL_COMMUNITY): Admission: RE | Payer: Self-pay | Source: Home / Self Care

## 2022-01-28 SURGERY — ENDARTERECTOMY, FEMORAL
Anesthesia: General | Laterality: Left

## 2022-01-29 ENCOUNTER — Other Ambulatory Visit: Payer: Self-pay | Admitting: Cardiovascular Disease

## 2022-02-04 DIAGNOSIS — L97522 Non-pressure chronic ulcer of other part of left foot with fat layer exposed: Secondary | ICD-10-CM | POA: Diagnosis not present

## 2022-02-04 DIAGNOSIS — R58 Hemorrhage, not elsewhere classified: Secondary | ICD-10-CM | POA: Diagnosis not present

## 2022-02-04 DIAGNOSIS — L97429 Non-pressure chronic ulcer of left heel and midfoot with unspecified severity: Secondary | ICD-10-CM | POA: Diagnosis not present

## 2022-02-04 DIAGNOSIS — I70245 Atherosclerosis of native arteries of left leg with ulceration of other part of foot: Secondary | ICD-10-CM | POA: Diagnosis not present

## 2022-02-04 DIAGNOSIS — I779 Disorder of arteries and arterioles, unspecified: Secondary | ICD-10-CM | POA: Diagnosis not present

## 2022-02-04 DIAGNOSIS — L97529 Non-pressure chronic ulcer of other part of left foot with unspecified severity: Secondary | ICD-10-CM | POA: Diagnosis not present

## 2022-02-04 DIAGNOSIS — I70244 Atherosclerosis of native arteries of left leg with ulceration of heel and midfoot: Secondary | ICD-10-CM | POA: Diagnosis not present

## 2022-02-04 DIAGNOSIS — Z87891 Personal history of nicotine dependence: Secondary | ICD-10-CM | POA: Diagnosis not present

## 2022-02-19 DIAGNOSIS — I480 Paroxysmal atrial fibrillation: Secondary | ICD-10-CM | POA: Diagnosis not present

## 2022-02-19 DIAGNOSIS — Z95820 Peripheral vascular angioplasty status with implants and grafts: Secondary | ICD-10-CM | POA: Diagnosis not present

## 2022-02-19 DIAGNOSIS — Z8719 Personal history of other diseases of the digestive system: Secondary | ICD-10-CM | POA: Diagnosis not present

## 2022-02-19 DIAGNOSIS — E1142 Type 2 diabetes mellitus with diabetic polyneuropathy: Secondary | ICD-10-CM | POA: Diagnosis not present

## 2022-02-19 DIAGNOSIS — E1151 Type 2 diabetes mellitus with diabetic peripheral angiopathy without gangrene: Secondary | ICD-10-CM | POA: Diagnosis not present

## 2022-02-19 DIAGNOSIS — I429 Cardiomyopathy, unspecified: Secondary | ICD-10-CM | POA: Diagnosis not present

## 2022-02-19 DIAGNOSIS — D6869 Other thrombophilia: Secondary | ICD-10-CM | POA: Diagnosis not present

## 2022-02-23 ENCOUNTER — Ambulatory Visit: Payer: PPO | Admitting: Cardiovascular Disease

## 2022-02-24 DIAGNOSIS — I4891 Unspecified atrial fibrillation: Secondary | ICD-10-CM | POA: Diagnosis not present

## 2022-02-24 DIAGNOSIS — L97522 Non-pressure chronic ulcer of other part of left foot with fat layer exposed: Secondary | ICD-10-CM | POA: Diagnosis not present

## 2022-02-24 DIAGNOSIS — Z01812 Encounter for preprocedural laboratory examination: Secondary | ICD-10-CM | POA: Diagnosis not present

## 2022-02-25 DIAGNOSIS — I080 Rheumatic disorders of both mitral and aortic valves: Secondary | ICD-10-CM | POA: Diagnosis not present

## 2022-02-25 DIAGNOSIS — I351 Nonrheumatic aortic (valve) insufficiency: Secondary | ICD-10-CM | POA: Diagnosis not present

## 2022-02-25 DIAGNOSIS — I517 Cardiomegaly: Secondary | ICD-10-CM | POA: Diagnosis not present

## 2022-02-25 DIAGNOSIS — I340 Nonrheumatic mitral (valve) insufficiency: Secondary | ICD-10-CM | POA: Diagnosis not present

## 2022-02-25 DIAGNOSIS — I779 Disorder of arteries and arterioles, unspecified: Secondary | ICD-10-CM | POA: Diagnosis not present

## 2022-02-25 DIAGNOSIS — L97522 Non-pressure chronic ulcer of other part of left foot with fat layer exposed: Secondary | ICD-10-CM | POA: Diagnosis not present

## 2022-02-27 DIAGNOSIS — I10 Essential (primary) hypertension: Secondary | ICD-10-CM | POA: Diagnosis not present

## 2022-02-27 DIAGNOSIS — E1151 Type 2 diabetes mellitus with diabetic peripheral angiopathy without gangrene: Secondary | ICD-10-CM | POA: Diagnosis not present

## 2022-02-27 DIAGNOSIS — N184 Chronic kidney disease, stage 4 (severe): Secondary | ICD-10-CM | POA: Diagnosis not present

## 2022-02-27 DIAGNOSIS — I509 Heart failure, unspecified: Secondary | ICD-10-CM | POA: Diagnosis not present

## 2022-03-03 DIAGNOSIS — J90 Pleural effusion, not elsewhere classified: Secondary | ICD-10-CM | POA: Diagnosis not present

## 2022-03-03 DIAGNOSIS — Z90710 Acquired absence of both cervix and uterus: Secondary | ICD-10-CM | POA: Diagnosis not present

## 2022-03-03 DIAGNOSIS — N1831 Chronic kidney disease, stage 3a: Secondary | ICD-10-CM | POA: Diagnosis not present

## 2022-03-03 DIAGNOSIS — R339 Retention of urine, unspecified: Secondary | ICD-10-CM | POA: Diagnosis not present

## 2022-03-03 DIAGNOSIS — I70228 Atherosclerosis of native arteries of extremities with rest pain, other extremity: Secondary | ICD-10-CM | POA: Diagnosis not present

## 2022-03-03 DIAGNOSIS — I779 Disorder of arteries and arterioles, unspecified: Secondary | ICD-10-CM | POA: Diagnosis not present

## 2022-03-03 DIAGNOSIS — Z7984 Long term (current) use of oral hypoglycemic drugs: Secondary | ICD-10-CM | POA: Diagnosis not present

## 2022-03-03 DIAGNOSIS — M19072 Primary osteoarthritis, left ankle and foot: Secondary | ICD-10-CM | POA: Diagnosis not present

## 2022-03-03 DIAGNOSIS — E1165 Type 2 diabetes mellitus with hyperglycemia: Secondary | ICD-10-CM | POA: Diagnosis not present

## 2022-03-03 DIAGNOSIS — E1151 Type 2 diabetes mellitus with diabetic peripheral angiopathy without gangrene: Secondary | ICD-10-CM | POA: Diagnosis not present

## 2022-03-03 DIAGNOSIS — I5030 Unspecified diastolic (congestive) heart failure: Secondary | ICD-10-CM | POA: Diagnosis not present

## 2022-03-03 DIAGNOSIS — D62 Acute posthemorrhagic anemia: Secondary | ICD-10-CM | POA: Diagnosis not present

## 2022-03-03 DIAGNOSIS — E119 Type 2 diabetes mellitus without complications: Secondary | ICD-10-CM | POA: Diagnosis not present

## 2022-03-03 DIAGNOSIS — T8383XA Hemorrhage of genitourinary prosthetic devices, implants and grafts, initial encounter: Secondary | ICD-10-CM | POA: Diagnosis not present

## 2022-03-03 DIAGNOSIS — I70229 Atherosclerosis of native arteries of extremities with rest pain, unspecified extremity: Secondary | ICD-10-CM | POA: Diagnosis not present

## 2022-03-03 DIAGNOSIS — R319 Hematuria, unspecified: Secondary | ICD-10-CM | POA: Diagnosis not present

## 2022-03-03 DIAGNOSIS — Z87891 Personal history of nicotine dependence: Secondary | ICD-10-CM | POA: Diagnosis not present

## 2022-03-03 DIAGNOSIS — E039 Hypothyroidism, unspecified: Secondary | ICD-10-CM | POA: Diagnosis not present

## 2022-03-03 DIAGNOSIS — I10 Essential (primary) hypertension: Secondary | ICD-10-CM | POA: Diagnosis not present

## 2022-03-03 DIAGNOSIS — Z9889 Other specified postprocedural states: Secondary | ICD-10-CM | POA: Diagnosis not present

## 2022-03-03 DIAGNOSIS — L89629 Pressure ulcer of left heel, unspecified stage: Secondary | ICD-10-CM | POA: Diagnosis not present

## 2022-03-03 DIAGNOSIS — Z95820 Peripheral vascular angioplasty status with implants and grafts: Secondary | ICD-10-CM | POA: Diagnosis not present

## 2022-03-03 DIAGNOSIS — I130 Hypertensive heart and chronic kidney disease with heart failure and stage 1 through stage 4 chronic kidney disease, or unspecified chronic kidney disease: Secondary | ICD-10-CM | POA: Diagnosis not present

## 2022-03-03 DIAGNOSIS — I4891 Unspecified atrial fibrillation: Secondary | ICD-10-CM | POA: Diagnosis not present

## 2022-03-03 DIAGNOSIS — D72829 Elevated white blood cell count, unspecified: Secondary | ICD-10-CM | POA: Diagnosis not present

## 2022-03-03 DIAGNOSIS — G8918 Other acute postprocedural pain: Secondary | ICD-10-CM | POA: Diagnosis not present

## 2022-03-03 DIAGNOSIS — I70222 Atherosclerosis of native arteries of extremities with rest pain, left leg: Secondary | ICD-10-CM | POA: Diagnosis not present

## 2022-03-03 DIAGNOSIS — I82511 Chronic embolism and thrombosis of right femoral vein: Secondary | ICD-10-CM | POA: Diagnosis not present

## 2022-03-03 DIAGNOSIS — I509 Heart failure, unspecified: Secondary | ICD-10-CM | POA: Diagnosis not present

## 2022-03-03 DIAGNOSIS — E1122 Type 2 diabetes mellitus with diabetic chronic kidney disease: Secondary | ICD-10-CM | POA: Diagnosis not present

## 2022-03-03 DIAGNOSIS — I959 Hypotension, unspecified: Secondary | ICD-10-CM | POA: Diagnosis not present

## 2022-03-03 DIAGNOSIS — I5022 Chronic systolic (congestive) heart failure: Secondary | ICD-10-CM | POA: Diagnosis not present

## 2022-03-03 DIAGNOSIS — I739 Peripheral vascular disease, unspecified: Secondary | ICD-10-CM | POA: Diagnosis not present

## 2022-03-03 DIAGNOSIS — C9210 Chronic myeloid leukemia, BCR/ABL-positive, not having achieved remission: Secondary | ICD-10-CM | POA: Diagnosis not present

## 2022-03-03 DIAGNOSIS — Z7985 Long-term (current) use of injectable non-insulin antidiabetic drugs: Secondary | ICD-10-CM | POA: Diagnosis not present

## 2022-03-03 DIAGNOSIS — E11621 Type 2 diabetes mellitus with foot ulcer: Secondary | ICD-10-CM | POA: Diagnosis not present

## 2022-03-03 DIAGNOSIS — Z4659 Encounter for fitting and adjustment of other gastrointestinal appliance and device: Secondary | ICD-10-CM | POA: Diagnosis not present

## 2022-03-03 DIAGNOSIS — I70245 Atherosclerosis of native arteries of left leg with ulceration of other part of foot: Secondary | ICD-10-CM | POA: Diagnosis not present

## 2022-03-03 DIAGNOSIS — S81802A Unspecified open wound, left lower leg, initial encounter: Secondary | ICD-10-CM | POA: Diagnosis not present

## 2022-03-03 DIAGNOSIS — I4821 Permanent atrial fibrillation: Secondary | ICD-10-CM | POA: Diagnosis not present

## 2022-03-03 DIAGNOSIS — L97522 Non-pressure chronic ulcer of other part of left foot with fat layer exposed: Secondary | ICD-10-CM | POA: Diagnosis not present

## 2022-03-03 DIAGNOSIS — C9310 Chronic myelomonocytic leukemia not having achieved remission: Secondary | ICD-10-CM | POA: Diagnosis not present

## 2022-03-03 DIAGNOSIS — L97822 Non-pressure chronic ulcer of other part of left lower leg with fat layer exposed: Secondary | ICD-10-CM | POA: Diagnosis not present

## 2022-03-03 DIAGNOSIS — L97521 Non-pressure chronic ulcer of other part of left foot limited to breakdown of skin: Secondary | ICD-10-CM | POA: Diagnosis not present

## 2022-03-03 DIAGNOSIS — R509 Fever, unspecified: Secondary | ICD-10-CM | POA: Diagnosis not present

## 2022-03-03 DIAGNOSIS — F419 Anxiety disorder, unspecified: Secondary | ICD-10-CM | POA: Diagnosis not present

## 2022-03-03 DIAGNOSIS — E785 Hyperlipidemia, unspecified: Secondary | ICD-10-CM | POA: Diagnosis not present

## 2022-03-03 DIAGNOSIS — K219 Gastro-esophageal reflux disease without esophagitis: Secondary | ICD-10-CM | POA: Diagnosis not present

## 2022-03-03 DIAGNOSIS — N189 Chronic kidney disease, unspecified: Secondary | ICD-10-CM | POA: Diagnosis not present

## 2022-03-03 DIAGNOSIS — N183 Chronic kidney disease, stage 3 unspecified: Secondary | ICD-10-CM | POA: Diagnosis not present

## 2022-03-03 DIAGNOSIS — E11622 Type 2 diabetes mellitus with other skin ulcer: Secondary | ICD-10-CM | POA: Diagnosis not present

## 2022-03-03 DIAGNOSIS — L97429 Non-pressure chronic ulcer of left heel and midfoot with unspecified severity: Secondary | ICD-10-CM | POA: Diagnosis not present

## 2022-03-09 ENCOUNTER — Ambulatory Visit: Payer: PPO

## 2022-03-09 ENCOUNTER — Other Ambulatory Visit (HOSPITAL_COMMUNITY): Payer: PPO

## 2022-03-09 ENCOUNTER — Encounter (HOSPITAL_COMMUNITY): Payer: PPO

## 2022-03-10 ENCOUNTER — Encounter (HOSPITAL_COMMUNITY): Payer: PPO

## 2022-03-10 ENCOUNTER — Other Ambulatory Visit (HOSPITAL_COMMUNITY): Payer: PPO

## 2022-03-10 ENCOUNTER — Ambulatory Visit: Payer: PPO

## 2022-03-16 DIAGNOSIS — I70245 Atherosclerosis of native arteries of left leg with ulceration of other part of foot: Secondary | ICD-10-CM | POA: Diagnosis not present

## 2022-03-16 DIAGNOSIS — I482 Chronic atrial fibrillation, unspecified: Secondary | ICD-10-CM | POA: Diagnosis not present

## 2022-03-16 DIAGNOSIS — M8580 Other specified disorders of bone density and structure, unspecified site: Secondary | ICD-10-CM | POA: Diagnosis not present

## 2022-03-16 DIAGNOSIS — L89620 Pressure ulcer of left heel, unstageable: Secondary | ICD-10-CM | POA: Diagnosis not present

## 2022-03-16 DIAGNOSIS — I701 Atherosclerosis of renal artery: Secondary | ICD-10-CM | POA: Diagnosis not present

## 2022-03-16 DIAGNOSIS — N189 Chronic kidney disease, unspecified: Secondary | ICD-10-CM | POA: Diagnosis not present

## 2022-03-16 DIAGNOSIS — I509 Heart failure, unspecified: Secondary | ICD-10-CM | POA: Diagnosis not present

## 2022-03-16 DIAGNOSIS — Z7982 Long term (current) use of aspirin: Secondary | ICD-10-CM | POA: Diagnosis not present

## 2022-03-16 DIAGNOSIS — I130 Hypertensive heart and chronic kidney disease with heart failure and stage 1 through stage 4 chronic kidney disease, or unspecified chronic kidney disease: Secondary | ICD-10-CM | POA: Diagnosis not present

## 2022-03-16 DIAGNOSIS — K142 Median rhomboid glossitis: Secondary | ICD-10-CM | POA: Diagnosis not present

## 2022-03-16 DIAGNOSIS — E1122 Type 2 diabetes mellitus with diabetic chronic kidney disease: Secondary | ICD-10-CM | POA: Diagnosis not present

## 2022-03-16 DIAGNOSIS — E1151 Type 2 diabetes mellitus with diabetic peripheral angiopathy without gangrene: Secondary | ICD-10-CM | POA: Diagnosis not present

## 2022-03-16 DIAGNOSIS — Z87891 Personal history of nicotine dependence: Secondary | ICD-10-CM | POA: Diagnosis not present

## 2022-03-16 DIAGNOSIS — H6123 Impacted cerumen, bilateral: Secondary | ICD-10-CM | POA: Diagnosis not present

## 2022-03-16 DIAGNOSIS — L97522 Non-pressure chronic ulcer of other part of left foot with fat layer exposed: Secondary | ICD-10-CM | POA: Diagnosis not present

## 2022-03-16 DIAGNOSIS — K146 Glossodynia: Secondary | ICD-10-CM | POA: Diagnosis not present

## 2022-03-16 DIAGNOSIS — I779 Disorder of arteries and arterioles, unspecified: Secondary | ICD-10-CM | POA: Diagnosis not present

## 2022-03-16 DIAGNOSIS — Z7984 Long term (current) use of oral hypoglycemic drugs: Secondary | ICD-10-CM | POA: Diagnosis not present

## 2022-03-16 DIAGNOSIS — K144 Atrophy of tongue papillae: Secondary | ICD-10-CM | POA: Diagnosis not present

## 2022-03-16 DIAGNOSIS — C9210 Chronic myeloid leukemia, BCR/ABL-positive, not having achieved remission: Secondary | ICD-10-CM | POA: Diagnosis not present

## 2022-03-16 DIAGNOSIS — Z7901 Long term (current) use of anticoagulants: Secondary | ICD-10-CM | POA: Diagnosis not present

## 2022-03-16 DIAGNOSIS — Z48812 Encounter for surgical aftercare following surgery on the circulatory system: Secondary | ICD-10-CM | POA: Diagnosis not present

## 2022-03-17 DIAGNOSIS — Z7982 Long term (current) use of aspirin: Secondary | ICD-10-CM | POA: Diagnosis not present

## 2022-03-17 DIAGNOSIS — L97522 Non-pressure chronic ulcer of other part of left foot with fat layer exposed: Secondary | ICD-10-CM | POA: Diagnosis not present

## 2022-03-17 DIAGNOSIS — I480 Paroxysmal atrial fibrillation: Secondary | ICD-10-CM | POA: Diagnosis not present

## 2022-03-17 DIAGNOSIS — Z6831 Body mass index (BMI) 31.0-31.9, adult: Secondary | ICD-10-CM | POA: Diagnosis not present

## 2022-03-17 DIAGNOSIS — I5020 Unspecified systolic (congestive) heart failure: Secondary | ICD-10-CM | POA: Diagnosis not present

## 2022-03-20 DIAGNOSIS — L97522 Non-pressure chronic ulcer of other part of left foot with fat layer exposed: Secondary | ICD-10-CM | POA: Diagnosis not present

## 2022-03-20 DIAGNOSIS — Z95820 Peripheral vascular angioplasty status with implants and grafts: Secondary | ICD-10-CM | POA: Diagnosis not present

## 2022-03-20 DIAGNOSIS — I70444 Atherosclerosis of autologous vein bypass graft(s) of the left leg with ulceration of heel and midfoot: Secondary | ICD-10-CM | POA: Diagnosis not present

## 2022-03-20 DIAGNOSIS — L97422 Non-pressure chronic ulcer of left heel and midfoot with fat layer exposed: Secondary | ICD-10-CM | POA: Diagnosis not present

## 2022-03-20 DIAGNOSIS — I10 Essential (primary) hypertension: Secondary | ICD-10-CM | POA: Diagnosis not present

## 2022-03-20 DIAGNOSIS — E11621 Type 2 diabetes mellitus with foot ulcer: Secondary | ICD-10-CM | POA: Diagnosis not present

## 2022-03-20 DIAGNOSIS — I70445 Atherosclerosis of autologous vein bypass graft(s) of the left leg with ulceration of other part of foot: Secondary | ICD-10-CM | POA: Diagnosis not present

## 2022-03-20 NOTE — Progress Notes (Unsigned)
?Office Note  ? ? ? ?CC:  follow up ?Requesting Provider:  Cyndi Bender, PA-C ? ?HPI: Sara Warner is a 81 y.o. (27-Sep-1941) female who presents for follow up of PAD and Renal artery stenosis. She has extensive vascular surgical history. She most recently underwent *** ?She has had prior renal artery stenting and in follow up there has been some elevated velocities on duplex. Her blood pressure and renal function has remained stable so no recurrent intervention has been planned.  ? ?Today she reports *** ? ?She is compliant with statin.  She is unable to use antiplatelet medication due to history of profound anemia and GI bleed. ?  ?The pt is on a statin for cholesterol management.  ?The pt is not on a daily aspirin.   Other AC:  none ?The pt is on BB for hypertension.   ?The pt is diabetic.   ?Tobacco hx:  Former ?  ?Past Medical History:  ?Diagnosis Date  ? Atrial fibrillation ([place])   ? Chronic atrial fibrillation (HCC)   ? a. Refuses [place].  CHA2DS2VASc = 4.  ? Chronic systolic CHF (congestive heart failure) ([place])   ? a. 05/2015 Echo: EF 35-40%.  ? CKD (chronic kidney disease), stage III ([place])   ? 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 DM (diabetes mellitus) ([place])   ? Dysrhythmia   ? Elevated WBC count   ? Epistaxis 04/03/2020  ? AFTER COVID SWAB  ? Hypertension   ? Hypertensive heart disease   ? Hypothyroidism   ? NICM (nonischemic cardiomyopathy) ([place])   ? a. 06/2013 MV: low risk study w/o ischemia;  b. 05/2015 Echo: EF 35-40%, diff HK, basal-midinferoseptal and basal-midanteroseptal AK. Mild-mod MR, mod dil LA, mild to mod TR, PASP 83mHg.  ? PAD (peripheral artery disease) ([place]   ? Pneumonia   ? 30 yrs. ago  ? Renal artery stenosis (HCC)   ? s/p left renal artery stent 02/18/21  ? ? ?Past Surgical History:  ?Procedure Laterality Date  ? ABDOMINAL AORTOGRAM W/LOWER EXTREMITY Bilateral 12/06/2019  ? Procedure: ABDOMINAL AORTOGRAM W/LOWER EXTREMITY;  Surgeon: CMarty Heck MD;  [place] [facility];  Service: Cardiovascular;  Laterality: Bilateral;  ? ABDOMINAL AORTOGRAM W/LOWER EXTREMITY Bilateral 05/15/2020  ? Procedure: ABDOMINAL AORTOGRAM W/LOWER EXTREMITY;  Surgeon: CMarty Heck MD;  [place];  Service: Cardiovascular;  Laterality: Bilateral;  ? ABDOMINAL AORTOGRAM W/LOWER EXTREMITY N/A 02/18/2021  ? Procedure: ABDOMINAL AORTOGRAM W/LOWER EXTREMITY;  Surgeon: BSerafina Mitchell MD;  [place];  Service: Cardiovascular;  Laterality: N/A;  ? ABDOMINAL HYSTERECTOMY    ? APPLICATION OF WOUND VAC  01/10/2020  ? BLEEDING FROM FEMORAL BYPASS  ? BIOPSY  04/08/2020  ? Procedure: BIOPSY;  Surgeon: GJackquline Denmark MD;  [facility];  Service: Endoscopy;;  ? CARDIOVERSION    ? COLONOSCOPY WITH PROPOFOL N/A 04/08/2020  ? Procedure: COLONOSCOPY WITH PROPOFOL;  Surgeon: GJackquline Denmark MD;  [facility];  Service: Endoscopy;  Laterality: N/A;  ? CYST EXCISION    ? removed on right ovary  ? CYSTECTOMY    ? ENDARTERECTOMY FEMORAL Left 06/20/2020  ? Procedure: LEFT COMMON FEMORAL ENDARTERECTOMY LEFT ABOVE KNEE POPLITEAL ENDARTERECTOMY;  Surgeon: CMarty Heck MD;  [place]  Service: Vascular;  Laterality: Left;  ? ENDARTERECTOMY FEMORAL Left 02/24/2021  ? Procedure: REDO LEFT COMMON FEMORAL ENDARTERECTOMY AND PROFUNDAPLASTY WTH BOVINE PATCH;  Surgeon: CMarty Heck MD;  [place]  Service:  Vascular;  Laterality: Left;  ? ENTEROSCOPY N/A 03/13/2021  ? Procedure: ENTEROSCOPY;  Surgeon: Jerene Bears, MD;  Location: [facility] ENDOSCOPY;  Service: Gastroenterology;  Laterality: N/A;  ? ESOPHAGOGASTRODUODENOSCOPY (EGD) WITH PROPOFOL N/A 04/08/2020  ? Procedure: ESOPHAGOGASTRODUODENOSCOPY (EGD) WITH PROPOFOL;  Surgeon: Jackquline Denmark, MD;  Location: [facility] ENDOSCOPY;  Service: Endoscopy;  Laterality: N/A;  ? ESOPHAGOGASTRODUODENOSCOPY (EGD) WITH PROPOFOL N/A 03/11/2021  ? Procedure: ESOPHAGOGASTRODUODENOSCOPY (EGD) WITH PROPOFOL;  Surgeon:  Jerene Bears, MD;  Location: [facility] ENDOSCOPY;  Service: Gastroenterology;  Laterality: N/A;  ? FEMORAL ARTERY - POPLITEAL ARTERY BYPASS GRAFT  12/15/2019  ? Right common femoral artery to below-knee popliteal artery bypass with ipsilateral nonreversed great saphenous vein  ? FEMORAL-POPLITEAL BYPASS GRAFT Right 12/15/2019  ? Procedure: BYPASS GRAFT FEMORAL-POPLITEAL ARTERY;  Surgeon: [name], MD;  Location: [place];  Service: Vascular;  Laterality: Right;  ? FEMORAL-POPLITEAL BYPASS GRAFT Left 06/20/2020  ? Procedure: BYPASS GRAFT FEMORAL TO ABOVE KNEE POPLITEAL ARTERY WITH HARVENSTED NON REVERSED GREATER SAPHENOUS VEIN;  Surgeon: [name], MD;  Location: [place];  Service: Vascular;  Laterality: Left;  ? FEMORAL-POPLITEAL BYPASS GRAFT Left 02/24/2021  ? Procedure: REDO LEFT COMMON FEMORAL ARTERY EXPOSURE AND LEFT COMMON FEMORAL ARTERY TO BELOW KNEE POPLITEAL ARTERY BYPASS GRAFT WITH PROPATEN VASCULAR GRAFT;  Surgeon: [name], MD;  Location: [place];  Service: Vascular;  Laterality: Left;  ? GIVENS CAPSULE STUDY N/A 03/11/2021  ? Procedure: GIVENS CAPSULE STUDY;  Surgeon: Jerene Bears, MD;  Location: [place];  Service: Gastroenterology;  Laterality: N/A;  ? GIVENS CAPSULE STUDY N/A 03/13/2021  ? Procedure: GIVENS CAPSULE STUDY;  Surgeon: Jerene Bears, MD;  Location: [place];  Service: Gastroenterology;  Laterality: N/A;  ? HOT HEMOSTASIS N/A 03/11/2021  ? Procedure: HOT HEMOSTASIS (ARGON PLASMA COAGULATION/BICAP);  Surgeon: Jerene Bears, MD;  Location: [facility] ENDOSCOPY;  Service: Gastroenterology;  Laterality: N/A;  ? HYSTEROTOMY    ? PERIPHERAL VASCULAR INTERVENTION Right 12/06/2019  ? Procedure: PERIPHERAL VASCULAR INTERVENTION;  Surgeon: [name], MD;  Location: [place] CV LAB;  Service: Cardiovascular;  Laterality: Right;  EXT ILIAC  ? PERIPHERAL VASCULAR INTERVENTION Left 02/18/2021  ? Procedure: PERIPHERAL VASCULAR INTERVENTION;  Surgeon: Serafina Mitchell, MD;   Location: [place] CV LAB;  Service: Cardiovascular;  Laterality: Left;  renal artery  ? POLYPECTOMY  04/08/2020  ? Procedure: POLYPECTOMY;  Surgeon: Jackquline Denmark, MD;  Location: [facility] ENDOSCOPY;  Service: Endoscopy;;  ? WOUND EXPLORATION Right 01/10/2020  ? Procedure: right lower leg Wound Exploration with wound vac application;  Surgeon: Serafina Mitchell, MD;  [facility];  Service: Vascular;  Laterality: Right;  ? ? ?Social History  ? ?Socioeconomic History  ? Marital status: Single  ?  Spouse name: Not on file  ? Number of children: Not on file  ? Years of education: Not on file  ? Highest education level: Not on file  ?Occupational History  ? Not on file  ?Tobacco Use  ? Smoking status: Former  ?  Types: Cigarettes  ?  Quit date: 01/01/1984  ?  Years since quitting: 38.2  ? Smokeless tobacco: Never  ?Vaping Use  ? Vaping Use: Never used  ?Substance and Sexual Activity  ? Alcohol use: No  ? Drug use: Never  ? Sexual activity: Not on file  ?Other Topics Concern  ? Not on file  ?Social History Narrative  ? Not on file  ? ?Social Determinants of Health  ? ?Financial Resource Strain:  Not on file  ?Food Insecurity: Not on file  ?Transportation Needs: Not on file  ?Physical Activity: Not on file  ?Stress: Not on file  ?Social Connections: Not on file  ?Intimate Partner Violence: Not on file  ? ?*** ?Family History  ?Problem Relation Age of Onset  ? Cancer - Colon Mother   ?     mast to lungs  ? Diabetes Father   ? Diabetes Paternal Grandmother   ? ? ?Current Outpatient Medications  ?Medication Sig Dispense Refill  ? amoxicillin-clavulanate (AUGMENTIN) 875-125 MG tablet Take 1 tablet by mouth 2 (two) times daily. 14 tablet 0  ? cetirizine (ZYRTEC) 10 MG tablet Take 10 mg by mouth daily.    ? clorazepate (TRANXENE-T) 7.5 MG tablet Take 1 tablet (7.5 mg total) by mouth 2 (two) times daily as needed for anxiety. 30 tablet 1  ? colchicine 0.6 MG tablet Take 0.6 mg by mouth 2 (two) times daily.    ? diclofenac Sodium  (VOLTAREN) 1 % GEL SMARTSIG:1 Gram(s) Topical 4 Times Daily PRN    ? digoxin (LANOXIN) 0.125 MG tablet Take 0.5 tablets (0.0625 mg total) by mouth every other day. 45 tablet 1  ? Dulaglutide 3 MG/0.5ML SOPN

## 2022-03-24 ENCOUNTER — Encounter (HOSPITAL_COMMUNITY): Payer: PPO

## 2022-03-24 ENCOUNTER — Ambulatory Visit: Payer: PPO

## 2022-03-24 DIAGNOSIS — I739 Peripheral vascular disease, unspecified: Secondary | ICD-10-CM

## 2022-03-24 DIAGNOSIS — I701 Atherosclerosis of renal artery: Secondary | ICD-10-CM

## 2022-03-25 DIAGNOSIS — N390 Urinary tract infection, site not specified: Secondary | ICD-10-CM | POA: Diagnosis not present

## 2022-03-25 DIAGNOSIS — R300 Dysuria: Secondary | ICD-10-CM | POA: Diagnosis not present

## 2022-03-27 DIAGNOSIS — E119 Type 2 diabetes mellitus without complications: Secondary | ICD-10-CM | POA: Diagnosis not present

## 2022-03-27 DIAGNOSIS — Z95820 Peripheral vascular angioplasty status with implants and grafts: Secondary | ICD-10-CM | POA: Diagnosis not present

## 2022-03-27 DIAGNOSIS — L97522 Non-pressure chronic ulcer of other part of left foot with fat layer exposed: Secondary | ICD-10-CM | POA: Diagnosis not present

## 2022-03-27 DIAGNOSIS — Z951 Presence of aortocoronary bypass graft: Secondary | ICD-10-CM | POA: Diagnosis not present

## 2022-03-27 DIAGNOSIS — I739 Peripheral vascular disease, unspecified: Secondary | ICD-10-CM | POA: Diagnosis not present

## 2022-03-27 DIAGNOSIS — L97422 Non-pressure chronic ulcer of left heel and midfoot with fat layer exposed: Secondary | ICD-10-CM | POA: Diagnosis not present

## 2022-04-01 DIAGNOSIS — R319 Hematuria, unspecified: Secondary | ICD-10-CM | POA: Diagnosis not present

## 2022-04-02 DIAGNOSIS — Z95820 Peripheral vascular angioplasty status with implants and grafts: Secondary | ICD-10-CM | POA: Diagnosis not present

## 2022-04-02 DIAGNOSIS — I779 Disorder of arteries and arterioles, unspecified: Secondary | ICD-10-CM | POA: Diagnosis not present

## 2022-04-03 DIAGNOSIS — L97422 Non-pressure chronic ulcer of left heel and midfoot with fat layer exposed: Secondary | ICD-10-CM | POA: Diagnosis not present

## 2022-04-03 DIAGNOSIS — L97522 Non-pressure chronic ulcer of other part of left foot with fat layer exposed: Secondary | ICD-10-CM | POA: Diagnosis not present

## 2022-04-03 DIAGNOSIS — I739 Peripheral vascular disease, unspecified: Secondary | ICD-10-CM | POA: Diagnosis not present

## 2022-04-03 DIAGNOSIS — N183 Chronic kidney disease, stage 3 unspecified: Secondary | ICD-10-CM | POA: Diagnosis not present

## 2022-04-03 DIAGNOSIS — L97922 Non-pressure chronic ulcer of unspecified part of left lower leg with fat layer exposed: Secondary | ICD-10-CM | POA: Diagnosis not present

## 2022-04-06 DIAGNOSIS — N183 Chronic kidney disease, stage 3 unspecified: Secondary | ICD-10-CM | POA: Diagnosis not present

## 2022-04-07 DIAGNOSIS — F419 Anxiety disorder, unspecified: Secondary | ICD-10-CM | POA: Diagnosis not present

## 2022-04-07 DIAGNOSIS — M109 Gout, unspecified: Secondary | ICD-10-CM | POA: Diagnosis not present

## 2022-04-07 DIAGNOSIS — D509 Iron deficiency anemia, unspecified: Secondary | ICD-10-CM | POA: Diagnosis not present

## 2022-04-07 DIAGNOSIS — L299 Pruritus, unspecified: Secondary | ICD-10-CM | POA: Diagnosis not present

## 2022-04-07 DIAGNOSIS — E1151 Type 2 diabetes mellitus with diabetic peripheral angiopathy without gangrene: Secondary | ICD-10-CM | POA: Diagnosis not present

## 2022-04-07 DIAGNOSIS — Z79899 Other long term (current) drug therapy: Secondary | ICD-10-CM | POA: Diagnosis not present

## 2022-04-07 DIAGNOSIS — I4891 Unspecified atrial fibrillation: Secondary | ICD-10-CM | POA: Diagnosis not present

## 2022-04-07 DIAGNOSIS — N184 Chronic kidney disease, stage 4 (severe): Secondary | ICD-10-CM | POA: Diagnosis not present

## 2022-04-07 DIAGNOSIS — E890 Postprocedural hypothyroidism: Secondary | ICD-10-CM | POA: Diagnosis not present

## 2022-04-07 DIAGNOSIS — R609 Edema, unspecified: Secondary | ICD-10-CM | POA: Diagnosis not present

## 2022-04-07 DIAGNOSIS — I739 Peripheral vascular disease, unspecified: Secondary | ICD-10-CM | POA: Diagnosis not present

## 2022-04-10 DIAGNOSIS — D631 Anemia in chronic kidney disease: Secondary | ICD-10-CM | POA: Diagnosis not present

## 2022-04-10 DIAGNOSIS — N2581 Secondary hyperparathyroidism of renal origin: Secondary | ICD-10-CM | POA: Diagnosis not present

## 2022-04-10 DIAGNOSIS — I701 Atherosclerosis of renal artery: Secondary | ICD-10-CM | POA: Diagnosis not present

## 2022-04-10 DIAGNOSIS — I129 Hypertensive chronic kidney disease with stage 1 through stage 4 chronic kidney disease, or unspecified chronic kidney disease: Secondary | ICD-10-CM | POA: Diagnosis not present

## 2022-04-10 DIAGNOSIS — N183 Chronic kidney disease, stage 3 unspecified: Secondary | ICD-10-CM | POA: Diagnosis not present

## 2022-04-15 DIAGNOSIS — D6869 Other thrombophilia: Secondary | ICD-10-CM | POA: Diagnosis not present

## 2022-04-15 DIAGNOSIS — I480 Paroxysmal atrial fibrillation: Secondary | ICD-10-CM | POA: Diagnosis not present

## 2022-04-15 DIAGNOSIS — Z8719 Personal history of other diseases of the digestive system: Secondary | ICD-10-CM | POA: Diagnosis not present

## 2022-04-15 DIAGNOSIS — E11621 Type 2 diabetes mellitus with foot ulcer: Secondary | ICD-10-CM | POA: Diagnosis not present

## 2022-04-15 DIAGNOSIS — E1122 Type 2 diabetes mellitus with diabetic chronic kidney disease: Secondary | ICD-10-CM | POA: Diagnosis not present

## 2022-04-15 DIAGNOSIS — E1142 Type 2 diabetes mellitus with diabetic polyneuropathy: Secondary | ICD-10-CM | POA: Diagnosis not present

## 2022-04-15 DIAGNOSIS — N183 Chronic kidney disease, stage 3 unspecified: Secondary | ICD-10-CM | POA: Diagnosis not present

## 2022-04-15 DIAGNOSIS — Z515 Encounter for palliative care: Secondary | ICD-10-CM | POA: Diagnosis not present

## 2022-04-15 DIAGNOSIS — Z95828 Presence of other vascular implants and grafts: Secondary | ICD-10-CM | POA: Diagnosis not present

## 2022-04-15 DIAGNOSIS — L97522 Non-pressure chronic ulcer of other part of left foot with fat layer exposed: Secondary | ICD-10-CM | POA: Diagnosis not present

## 2022-04-17 DIAGNOSIS — E11621 Type 2 diabetes mellitus with foot ulcer: Secondary | ICD-10-CM | POA: Diagnosis not present

## 2022-04-17 DIAGNOSIS — I739 Peripheral vascular disease, unspecified: Secondary | ICD-10-CM | POA: Diagnosis not present

## 2022-04-17 DIAGNOSIS — L97422 Non-pressure chronic ulcer of left heel and midfoot with fat layer exposed: Secondary | ICD-10-CM | POA: Diagnosis not present

## 2022-04-17 DIAGNOSIS — L97522 Non-pressure chronic ulcer of other part of left foot with fat layer exposed: Secondary | ICD-10-CM | POA: Diagnosis not present

## 2022-04-17 DIAGNOSIS — Z95820 Peripheral vascular angioplasty status with implants and grafts: Secondary | ICD-10-CM | POA: Diagnosis not present

## 2022-04-21 ENCOUNTER — Other Ambulatory Visit: Payer: Self-pay | Admitting: Oncology

## 2022-04-21 ENCOUNTER — Telehealth: Payer: Self-pay | Admitting: *Deleted

## 2022-04-21 DIAGNOSIS — C9210 Chronic myeloid leukemia, BCR/ABL-positive, not having achieved remission: Secondary | ICD-10-CM

## 2022-04-21 DIAGNOSIS — D509 Iron deficiency anemia, unspecified: Secondary | ICD-10-CM

## 2022-04-21 NOTE — Telephone Encounter (Signed)
Returned PC to Terri at [employer], no answer, left VM - informed her that Dr. Alen Blew reviewed the labs she faxed today, he does not feel the patient needs IV iron at this time, he will repeat labs & reevaluate the patient at her scheduled appointment in July.  Instructed Terri to call this office with any further questions/concerns, [phone].

## 2022-04-24 ENCOUNTER — Other Ambulatory Visit: Payer: Self-pay | Admitting: Cardiovascular Disease

## 2022-05-01 DIAGNOSIS — L97522 Non-pressure chronic ulcer of other part of left foot with fat layer exposed: Secondary | ICD-10-CM | POA: Diagnosis not present

## 2022-05-01 DIAGNOSIS — E11621 Type 2 diabetes mellitus with foot ulcer: Secondary | ICD-10-CM | POA: Diagnosis not present

## 2022-05-01 DIAGNOSIS — L97422 Non-pressure chronic ulcer of left heel and midfoot with fat layer exposed: Secondary | ICD-10-CM | POA: Diagnosis not present

## 2022-05-01 DIAGNOSIS — N183 Chronic kidney disease, stage 3 unspecified: Secondary | ICD-10-CM | POA: Diagnosis not present

## 2022-05-01 DIAGNOSIS — L97922 Non-pressure chronic ulcer of unspecified part of left lower leg with fat layer exposed: Secondary | ICD-10-CM | POA: Diagnosis not present

## 2022-05-01 DIAGNOSIS — E1122 Type 2 diabetes mellitus with diabetic chronic kidney disease: Secondary | ICD-10-CM | POA: Diagnosis not present

## 2022-05-01 DIAGNOSIS — I739 Peripheral vascular disease, unspecified: Secondary | ICD-10-CM | POA: Diagnosis not present

## 2022-05-05 ENCOUNTER — Other Ambulatory Visit: Payer: Self-pay | Admitting: Cardiovascular Disease

## 2022-05-15 DIAGNOSIS — E11621 Type 2 diabetes mellitus with foot ulcer: Secondary | ICD-10-CM | POA: Diagnosis not present

## 2022-05-15 DIAGNOSIS — L97522 Non-pressure chronic ulcer of other part of left foot with fat layer exposed: Secondary | ICD-10-CM | POA: Diagnosis not present

## 2022-05-15 DIAGNOSIS — E1122 Type 2 diabetes mellitus with diabetic chronic kidney disease: Secondary | ICD-10-CM | POA: Diagnosis not present

## 2022-05-15 DIAGNOSIS — I129 Hypertensive chronic kidney disease with stage 1 through stage 4 chronic kidney disease, or unspecified chronic kidney disease: Secondary | ICD-10-CM | POA: Diagnosis not present

## 2022-05-15 DIAGNOSIS — E1151 Type 2 diabetes mellitus with diabetic peripheral angiopathy without gangrene: Secondary | ICD-10-CM | POA: Diagnosis not present

## 2022-05-15 DIAGNOSIS — N189 Chronic kidney disease, unspecified: Secondary | ICD-10-CM | POA: Diagnosis not present

## 2022-05-15 DIAGNOSIS — S91109D Unspecified open wound of unspecified toe(s) without damage to nail, subsequent encounter: Secondary | ICD-10-CM | POA: Diagnosis not present

## 2022-05-15 DIAGNOSIS — I70222 Atherosclerosis of native arteries of extremities with rest pain, left leg: Secondary | ICD-10-CM | POA: Diagnosis not present

## 2022-05-15 DIAGNOSIS — Z95820 Peripheral vascular angioplasty status with implants and grafts: Secondary | ICD-10-CM | POA: Diagnosis not present

## 2022-05-15 DIAGNOSIS — L97422 Non-pressure chronic ulcer of left heel and midfoot with fat layer exposed: Secondary | ICD-10-CM | POA: Diagnosis not present

## 2022-05-15 DIAGNOSIS — I4891 Unspecified atrial fibrillation: Secondary | ICD-10-CM | POA: Diagnosis not present

## 2022-05-15 DIAGNOSIS — L97822 Non-pressure chronic ulcer of other part of left lower leg with fat layer exposed: Secondary | ICD-10-CM | POA: Diagnosis not present

## 2022-05-15 DIAGNOSIS — E11622 Type 2 diabetes mellitus with other skin ulcer: Secondary | ICD-10-CM | POA: Diagnosis not present

## 2022-05-15 DIAGNOSIS — S81802D Unspecified open wound, left lower leg, subsequent encounter: Secondary | ICD-10-CM | POA: Diagnosis not present

## 2022-05-26 DIAGNOSIS — N183 Chronic kidney disease, stage 3 unspecified: Secondary | ICD-10-CM | POA: Diagnosis not present

## 2022-05-27 DIAGNOSIS — D631 Anemia in chronic kidney disease: Secondary | ICD-10-CM | POA: Diagnosis not present

## 2022-05-27 DIAGNOSIS — R609 Edema, unspecified: Secondary | ICD-10-CM | POA: Diagnosis not present

## 2022-05-27 DIAGNOSIS — N183 Chronic kidney disease, stage 3 unspecified: Secondary | ICD-10-CM | POA: Diagnosis not present

## 2022-05-27 DIAGNOSIS — I701 Atherosclerosis of renal artery: Secondary | ICD-10-CM | POA: Diagnosis not present

## 2022-05-27 DIAGNOSIS — I129 Hypertensive chronic kidney disease with stage 1 through stage 4 chronic kidney disease, or unspecified chronic kidney disease: Secondary | ICD-10-CM | POA: Diagnosis not present

## 2022-05-27 DIAGNOSIS — N2581 Secondary hyperparathyroidism of renal origin: Secondary | ICD-10-CM | POA: Diagnosis not present

## 2022-05-29 DIAGNOSIS — L97522 Non-pressure chronic ulcer of other part of left foot with fat layer exposed: Secondary | ICD-10-CM | POA: Diagnosis not present

## 2022-05-29 DIAGNOSIS — L97422 Non-pressure chronic ulcer of left heel and midfoot with fat layer exposed: Secondary | ICD-10-CM | POA: Diagnosis not present

## 2022-05-29 DIAGNOSIS — I739 Peripheral vascular disease, unspecified: Secondary | ICD-10-CM | POA: Diagnosis not present

## 2022-06-04 ENCOUNTER — Other Ambulatory Visit: Payer: Self-pay | Admitting: Cardiovascular Disease

## 2022-06-04 NOTE — Progress Notes (Signed)
Cardiology Clinic Note   Patient Name: Sara Warner Date of Encounter: 06/05/2022  Primary Care Provider:  Cyndi Bender, PA-C Primary Cardiologist:  Shelva Majestic, MD  Patient Profile    81 year old female patient with known history of permanent atrial fibrillation, not on anticoagulation therapy due to recurrent epistaxis, (on aspirin only), suspected OSA but did not pursue sleep study, most recent echocardiogram revealing an EF of 45 to 50% in 2019 severe hypokinesis of the septal myocardium and mild MR, bilateral atrial dilatation.   She has history of extensive PAD (status post left common femoral endarterectomy and prone fundal plasty with bovine pericardial patch angioplasty, left above-the-knee endarterectomy, left common femoral above-the-knee popliteal artery bypass with nonreversible ipsilateral great saphenous vein graft by Dr. Monica Martinez); unfortunately the patient had to undergo intervention of 90% left renal artery stenosis with stenting, the left common femoral artery was occluded with reconstruction of the proximal profundofemoral artery, followed by Dr. Trula Slade.  Other history includes hyperlipidemia, hypothyroidism, chronic kidney disease stage IV, type 2 diabetes, and chronic myelocytic leukemia.  Last seen by Dr. Claiborne Billings on 08/21/2020, was complaining of chronic lower extremity edema.  She was increased on Lasix to 40 mg in the    Past Medical History    Past Medical History:  Diagnosis Date   Atrial fibrillation ([place])    Chronic atrial fibrillation ([place])    a. Refuses [place].  CHA2DS2VASc = 4.   Chronic systolic CHF (congestive heart failure) ([place])    a. 05/2015 Echo: EF 35-40%.   CKD (chronic kidney disease), stage III (HCC)    CML (chronic myelocytic leukemia) ([place]) 01/10/2013   DM (diabetes mellitus) ([place])    Dysrhythmia    Elevated WBC count    Epistaxis 04/03/2020   AFTER COVID SWAB   Hypertension    Hypertensive heart disease    Hypothyroidism    NICM  (nonischemic cardiomyopathy) ([place])    a. 06/2013 MV: low risk study w/o ischemia;  b. 05/2015 Echo: EF 35-40%, diff HK, basal-midinferoseptal and basal-midanteroseptal AK. Mild-mod MR, mod dil LA, mild to mod TR, PASP 76mHg.   PAD (peripheral artery disease) ([place]    Pneumonia    30 yrs. ago   Renal artery stenosis (HCC)    s/p left renal artery stent 02/18/21   Past Surgical History:  Procedure Laterality Date   ABDOMINAL AORTOGRAM W/LOWER EXTREMITY Bilateral 12/06/2019   Procedure: ABDOMINAL AORTOGRAM W/LOWER EXTREMITY;  Surgeon: CMarty Heck MD;  [facility];  Service: Cardiovascular;  Laterality: Bilateral;   ABDOMINAL AORTOGRAM W/LOWER EXTREMITY Bilateral 05/15/2020   Procedure: ABDOMINAL AORTOGRAM W/LOWER EXTREMITY;  Surgeon: CMarty Heck MD;  [facility];  Service: Cardiovascular;  Laterality: Bilateral;   ABDOMINAL AORTOGRAM W/LOWER EXTREMITY N/A 02/18/2021   Procedure: ABDOMINAL AORTOGRAM W/LOWER EXTREMITY;  Surgeon: BSerafina Mitchell MD;  [facility];  Service: Cardiovascular;  Laterality: N/A;   ABDOMINAL HYSTERECTOMY     APPLICATION OF WOUND VAC  01/10/2020   BLEEDING FROM FEMORAL BYPASS   BIOPSY  04/08/2020   Procedure: BIOPSY;  Surgeon: GJackquline Denmark MD;  [facility] [facility]  Service: Endoscopy;;   CARDIOVERSION     COLONOSCOPY WITH PROPOFOL N/A 04/08/2020   Procedure: COLONOSCOPY WITH PROPOFOL;  Surgeon: GJackquline Denmark MD;  [facility];  Service: Endoscopy;  Laterality: N/A;   CYST EXCISION     removed on right ovary   CYSTECTOMY     ENDARTERECTOMY FEMORAL Left 06/20/2020   Procedure:  LEFT COMMON FEMORAL ENDARTERECTOMY LEFT ABOVE KNEE POPLITEAL ENDARTERECTOMY;  Surgeon: [name], MD;  Location: [facility] OR;  Service: Vascular;  Laterality: Left;   ENDARTERECTOMY FEMORAL Left 02/24/2021   Procedure: REDO LEFT COMMON FEMORAL ENDARTERECTOMY AND PROFUNDAPLASTY [place];  Surgeon: [name], MD;  Location: [facility] OR;  Service: Vascular;  Laterality: Left;   ENTEROSCOPY N/A 03/13/2021   Procedure: ENTEROSCOPY;  Surgeon: Jerene Bears, MD;  Location: [place];  Service: Gastroenterology;  Laterality: N/A;   ESOPHAGOGASTRODUODENOSCOPY (EGD) WITH PROPOFOL N/A 04/08/2020   Procedure: ESOPHAGOGASTRODUODENOSCOPY (EGD) WITH PROPOFOL;  Surgeon: Jackquline Denmark, MD;  Location: [facility] ENDOSCOPY;  Service: Endoscopy;  Laterality: N/A;   ESOPHAGOGASTRODUODENOSCOPY (EGD) WITH PROPOFOL N/A 03/11/2021   Procedure: ESOPHAGOGASTRODUODENOSCOPY (EGD) WITH PROPOFOL;  Surgeon: Jerene Bears, MD;  Location: [facility] ENDOSCOPY;  Service: Gastroenterology;  Laterality: N/A;   FEMORAL ARTERY - POPLITEAL ARTERY BYPASS GRAFT  12/15/2019   Right common femoral artery to below-knee popliteal artery bypass with ipsilateral nonreversed great saphenous vein   FEMORAL-POPLITEAL BYPASS GRAFT Right 12/15/2019   Procedure: BYPASS GRAFT FEMORAL-POPLITEAL ARTERY;  Surgeon: [name], MD;  Location[facility] OR;  Service: Vascular;  Laterality: Right;   FEMORAL-POPLITEAL BYPASS GRAFT Left 06/20/2020   Procedure: BYPASS GRAFT FEMORAL TO ABOVE KNEE POPLITEAL ARTERY WITH HARVENSTED NON REVERSED GREATER SAPHENOUS VEIN;  Surgeon: [name], MD;  Location: [place];  Service: Vascular;  Laterality: Left;   FEMORAL-POPLITEAL BYPASS GRAFT Left 02/24/2021   Procedure: REDO LEFT COMMON FEMORAL ARTERY EXPOSURE AND LEFT COMMON FEMORAL ARTERY TO BELOW KNEE POPLITEAL ARTERY BYPASS GRAFT WITH PROPATEN VASCULAR GRAFT;  Surgeon: [name], MD;  Location: [place];  Service: Vascular;  Laterality: Left;   GIVENS CAPSULE STUDY N/A 03/11/2021   Procedure: GIVENS CAPSULE STUDY;  Surgeon: Jerene Bears, MD;  Location: [place];  Service: Gastroenterology;  Laterality: N/A;   GIVENS CAPSULE STUDY N/A 03/13/2021   Procedure: GIVENS CAPSULE STUDY;  Surgeon: Jerene Bears, MD;  Location: [place];  Service: Gastroenterology;   Laterality: N/A;   HOT HEMOSTASIS N/A 03/11/2021   Procedure: HOT HEMOSTASIS (ARGON PLASMA COAGULATION/BICAP);  Surgeon: Jerene Bears, MD;  Location: [facility] ENDOSCOPY;  Service: Gastroenterology;  Laterality: N/A;   HYSTEROTOMY     PERIPHERAL VASCULAR INTERVENTION Right 12/06/2019   Procedure: PERIPHERAL VASCULAR INTERVENTION;  Surgeon: [name], MD;  Location: [place] [facility] LAB;  Service: Cardiovascular;  Laterality: Right;  EXT ILIAC   PERIPHERAL VASCULAR INTERVENTION Left 02/18/2021   Procedure: PERIPHERAL VASCULAR INTERVENTION;  Surgeon: Serafina Mitchell, MD;  Location: [place] CV LAB;  Service: Cardiovascular;  Laterality: Left;  renal artery   POLYPECTOMY  04/08/2020   Procedure: POLYPECTOMY;  Surgeon: Jackquline Denmark, MD;  Location: [facility] ENDOSCOPY;  Service: Endoscopy;;   WOUND EXPLORATION Right 01/10/2020   Procedure: right lower leg Wound Exploration with wound vac application;  Surgeon: Serafina Mitchell, MD;  Location: [place];  Service: Vascular;  Laterality: Right;    Allergies  Allergies  Allergen Reactions   Lisinopril Cough   Losartan Other (See Comments)    Patient reports nightmares/vivid dreams when taking.    History of Present Illness    Sara Warner comes today for ongoing assessment and management of chronic atrial fibrillation, hypertensive heart disease, nonischemic cardiomyopathy, with history of PAD with multiple interventions and management by Dr. Trula Slade.   Since being seen last medications have been adjusted to include discontinuation of spironolactone, changing furosemide to torsemide 20 mg  daily she is being followed closely by vein and vascular service, and also with wound clinic for left foot ulcer.  She denies any new complaints.  No dizziness, chest discomfort, palpitations, or near syncope.  She is not very active due to the wound on her left foot.  She does get up and walk some but is more sedentary.  Home Medications    Current Outpatient  Medications  Medication Sig Dispense Refill   amoxicillin-clavulanate (AUGMENTIN) 875-125 MG tablet Take 1 tablet by mouth 2 (two) times daily. 14 tablet 0   cetirizine (ZYRTEC) 10 MG tablet Take 10 mg by mouth daily.     clorazepate (TRANXENE-T) 7.5 MG tablet Take 1 tablet (7.5 mg total) by mouth 2 (two) times daily as needed for anxiety. 30 tablet 1   colchicine 0.6 MG tablet Take 0.6 mg by mouth 2 (two) times daily.     diclofenac Sodium (VOLTAREN) 1 % GEL SMARTSIG:1 Gram(s) Topical 4 Times Daily PRN     Dulaglutide 3 MG/0.5ML SOPN Inject 3 mg into the skin once a week.     furosemide (LASIX) 20 MG tablet TAKE 2 TABLETS BY MOUTH IN MORNING AND 1 TABLET IF GAIN 3 OR MORE POUNDS IN ** NEED APPOINTMENT 90 tablet 0   glipiZIDE (GLUCOTROL XL) 10 MG 24 hr tablet Take 10 mg by mouth 2 (two) times daily.      iron polysaccharides (NIFEREX) 150 MG capsule Take 150 mg by mouth daily.     nilotinib (TASIGNA) 150 MG capsule Take 2 capsules (300 mg total) by mouth every 12 (twelve) hours. Take on an empty stomach, 1 hour before or 2 hours after meals. 120 capsule 0   oxyCODONE-acetaminophen (PERCOCET/ROXICET) 5-325 MG tablet Take 1 tablet by mouth every 4 (four) hours as needed for severe pain. 30 tablet 0   pantoprazole (PROTONIX) 40 MG tablet Take 40 mg by mouth daily.     rosuvastatin (CRESTOR) 10 MG tablet Take 1 tablet (10 mg total) by mouth daily. 90 tablet 3   sitaGLIPtin (JANUVIA) 50 MG tablet Take 50 mg by mouth daily.     sucralfate (CARAFATE) 1 g tablet Take 1 g by mouth 4 (four) times daily.     SYNTHROID 100 MCG tablet Take 100 mcg by mouth daily.     torsemide (DEMADEX) 20 MG tablet Take 20 mg by mouth daily. Take 1 Tablet Daily.     digoxin (LANOXIN) 0.125 MG tablet Take 0.5 tablets (0.0625 mg total) by mouth every other day. 45 tablet 1   metoprolol (TOPROL-XL) 200 MG 24 hr tablet Take 1 tablet by mouth daily. 90 tablet 3   No current facility-administered medications for this visit.      Family History    Family History  Problem Relation Age of Onset   Cancer - Colon Mother        mast to lungs   Diabetes Father    Diabetes Paternal Grandmother    She indicated that her mother is deceased. She indicated that her father is alive. She indicated that her paternal grandmother is alive.  Social History    Social History   Socioeconomic History   Marital status: Single    Spouse name: Not on file   Number of children: Not on file   Years of education: Not on file   Highest education level: Not on file  Occupational History   Not on file  Tobacco Use   Smoking status: Former    Types:  Cigarettes    Quit date: 01/01/1984    Years since quitting: 38.4   Smokeless tobacco: Never  Vaping Use   Vaping Use: Never used  Substance and Sexual Activity   Alcohol use: No   Drug use: Never   Sexual activity: Not on file  Other Topics Concern   Not on file  Social History Narrative   Not on file   Social Determinants of Health   Financial Resource Strain: Not on file  Food Insecurity: Not on file  Transportation Needs: Not on file  Physical Activity: Not on file  Stress: Not on file  Social Connections: Not on file  Intimate Partner Violence: Not on file     Review of Systems    General:  No chills, fever, night sweats or weight changes.  Cardiovascular:  No chest pain, dyspnea on exertion, edema, orthopnea, palpitations, paroxysmal nocturnal dyspnea. Dermatological: No rash, lesions/masses Respiratory: No cough, dyspnea Urologic: No hematuria, dysuria Abdominal:   No nausea, vomiting, diarrhea, bright red blood per rectum, melena, or hematemesis Neurologic:  No visual changes, wkns, changes in mental status. All other systems reviewed and are otherwise negative except as noted above.     Physical Exam    VS:  BP (!) 103/50   Pulse 86   Ht '5\' 8"'$  (1.727 m)   Wt 190 lb (86.2 kg)   SpO2 97%   BMI 28.89 kg/m  , BMI Body mass index is 28.89 kg/m.      GEN: Well nourished, well developed, in no acute distress.  Sitting in a wheelchair HEENT: normal. Neck: Supple, no JVD, bilateral carotid bruits, no masses. Cardiac: IRRR, no murmurs, rubs, or gallops. No clubbing, cyanosis, edema.  Radials/DP/PT 2+ and equal bilaterally.  Respiratory:  Respirations regular and unlabored, clear to auscultation bilaterally. GI: Soft, nontender, nondistended, BS + x 4. MS: no deformity or atrophy.  Dressing to left foot with plastic foot cover in place. Skin: warm and dry, no rash. Neuro:  Strength and sensation are intact. Psych: Normal affect.    Lab Results  Component Value Date   WBC 11.7 (H) 12/16/2021   HGB 13.2 12/16/2021   HCT 43.0 12/16/2021   MCV 83.5 12/16/2021   PLT 443 (H) 12/16/2021   Lab Results  Component Value Date   CREATININE 1.42 (H) 12/16/2021   BUN 30 (H) 12/16/2021   NA 138 12/16/2021   K 4.5 12/16/2021   CL 103 12/16/2021   CO2 26 12/16/2021   Lab Results  Component Value Date   ALT 12 12/16/2021   AST 11 (L) 12/16/2021   ALKPHOS 61 12/16/2021   BILITOT 0.5 12/16/2021   Lab Results  Component Value Date   CHOL 107 02/25/2021   HDL 21 (L) 02/25/2021   LDLCALC 62 02/25/2021   TRIG 122 02/25/2021   CHOLHDL 5.1 02/25/2021    Lab Results  Component Value Date   HGBA1C 8.3 (H) 02/24/2021    Review of Prior Studies: Echocardiogram 02/04/2018 Left ventricle: The cavity size was moderately dilated. Wall    thickness was normal. Systolic function was mildly reduced. The    estimated ejection fraction was in the range of 45% to 50%. There    is severe hypokinesis of the septal myocardium.  - Mitral valve: There was mild regurgitation.  - Left atrium: The atrium was moderately dilated.  - Right atrium: The atrium was moderately dilated.  - Pulmonary arteries: PA peak pressure: 35 mm Hg (S).   Assessment &  Plan   1.  Permanent atrial fibrillation: She is not a candidate for anticoagulation due to recurrent  epistaxis, but is able to take aspirin.  She denies any rapid heart rhythms, dizziness, or chest discomfort.  Refills were provided on metoprolol 200 mg daily and digoxin.  Labs are followed closely by PCP and Dr. Carlis Abbott with vein and vascular service.  Digoxin level should be drawn on next visit if not completed prior.  2.  Hypertension: really work hard on that lood pressure is low normal today.  She is not very active.  Blood pressures are unequal in both arms highest in the left arm.  No changes in her medication regimen now.  As she becomes more ambulatory we will follow this more closely.  3.  Peripheral arterial disease: Multivessel disease followed by Dr. Carlis Abbott and Dr. Trula Slade.  Multiple interventions with bypass and stenting.  Left foot ulcer followed by wound care with dressing noted.  Not a candidate for antiplatelet therapy currently.  Defer to Dr. Carlis Abbott for ongoing management  4.  Hypercholesterolemia: Remains on rosuvastatin 10 mg daily.  Goal of LDL less than 50 due to high risk patient.  Lipids and LFTs are followed by primary care and Vein and Vascular service  If not completed on next follow-up visit these should be drawn.  5.  Chronic kidney disease stage IV: Followed by nephrology.  They have reviewed her medications and have allowed for adjustment of torsemide to 20 mg daily and discontinuation of spironolactone.  They are following her labs as well.    Current medicines are reviewed at length with the patient today.  I have spent 25 min's  dedicated to the care of this patient on the date of this encounter to include pre-visit review of records, assessment, management and diagnostic testing,with shared decision making. Signed, Phill Myron. West Pugh, ANP, AACC   06/05/2022 2:31 PM    [facility] [address] Office [phone] Fax [phone]  Notice: This dictation was prepared with Dragon dictation along with smaller phrase  technology. Any transcriptional errors that result from this process are unintentional and may not be corrected upon review.

## 2022-06-05 ENCOUNTER — Encounter: Payer: Self-pay | Admitting: Adult Health

## 2022-06-05 ENCOUNTER — Ambulatory Visit (INDEPENDENT_AMBULATORY_CARE_PROVIDER_SITE_OTHER): Payer: PPO | Admitting: Adult Health

## 2022-06-05 VITALS — BP 103/50 | HR 86 | Ht 68.0 in | Wt 190.0 lb

## 2022-06-05 DIAGNOSIS — I10 Essential (primary) hypertension: Secondary | ICD-10-CM | POA: Diagnosis not present

## 2022-06-05 DIAGNOSIS — I739 Peripheral vascular disease, unspecified: Secondary | ICD-10-CM

## 2022-06-05 DIAGNOSIS — E7800 Pure hypercholesterolemia, unspecified: Secondary | ICD-10-CM | POA: Diagnosis not present

## 2022-06-05 DIAGNOSIS — E119 Type 2 diabetes mellitus without complications: Secondary | ICD-10-CM

## 2022-06-05 DIAGNOSIS — I4821 Permanent atrial fibrillation: Secondary | ICD-10-CM

## 2022-06-05 DIAGNOSIS — I701 Atherosclerosis of renal artery: Secondary | ICD-10-CM

## 2022-06-05 MED ORDER — ROSUVASTATIN CALCIUM 10 MG PO TABS: 10.0000 mg | ORAL_TABLET | Freq: Every day | ORAL | 3 refills | Status: DC

## 2022-06-05 MED ORDER — METOPROLOL SUCCINATE ER 200 MG PO TB24: ORAL_TABLET | ORAL | 3 refills | Status: DC

## 2022-06-05 MED ORDER — DIGOXIN 125 MCG PO TABS: 0.0625 mg | ORAL_TABLET | ORAL | 1 refills | Status: DC

## 2022-06-05 NOTE — Patient Instructions (Signed)
Medication Instructions:  No changes *If you need a refill on your cardiac medications before your next appointment, please call your pharmacy*   Lab Work: No Labs If you have labs (blood work) drawn today and your tests are completely normal, you will receive your results only by: [place] (if you have MyChart) OR A paper copy in the mail If you have any lab test that is abnormal or we need to change your treatment, we will call you to review the results.   Testing/Procedures: No Testing   Follow-Up: At [facility], you and your health needs are our priority.  As part of our continuing mission to provide you with exceptional heart care, we have created designated Provider Care Teams.  These Care Teams include your primary Cardiologist (physician) and Advanced Practice Providers (APPs -  Physician Assistants and Nurse Practitioners) who all work together to provide you with the care you need, when you need it.  We recommend signing up for the patient portal called "MyChart".  Sign up information is provided on this After Visit Summary.  MyChart is used to connect with patients for Virtual Visits (Telemedicine).  Patients are able to view lab/test results, encounter notes, upcoming appointments, etc.  Non-urgent messages can be sent to your provider as well.   To learn more about what you can do with MyChart, go to [email or web address].    Your next appointment:   6 month(s)  The format for your next appointment:   In Person  Provider:   Shelva Majestic, MD       Important Information About Sugar

## 2022-06-12 DIAGNOSIS — I739 Peripheral vascular disease, unspecified: Secondary | ICD-10-CM | POA: Diagnosis not present

## 2022-06-12 DIAGNOSIS — E11621 Type 2 diabetes mellitus with foot ulcer: Secondary | ICD-10-CM | POA: Diagnosis not present

## 2022-06-12 DIAGNOSIS — L97422 Non-pressure chronic ulcer of left heel and midfoot with fat layer exposed: Secondary | ICD-10-CM | POA: Diagnosis not present

## 2022-06-12 DIAGNOSIS — L97522 Non-pressure chronic ulcer of other part of left foot with fat layer exposed: Secondary | ICD-10-CM | POA: Diagnosis not present

## 2022-06-17 ENCOUNTER — Inpatient Hospital Stay (HOSPITAL_BASED_OUTPATIENT_CLINIC_OR_DEPARTMENT_OTHER): Payer: PPO | Admitting: Oncology

## 2022-06-17 ENCOUNTER — Inpatient Hospital Stay: Payer: PPO | Attending: Oncology

## 2022-06-17 ENCOUNTER — Other Ambulatory Visit: Payer: Self-pay

## 2022-06-17 VITALS — BP 118/65 | HR 89 | Temp 97.6°F | Resp 17 | Ht 68.0 in | Wt 190.1 lb

## 2022-06-17 DIAGNOSIS — C9210 Chronic myeloid leukemia, BCR/ABL-positive, not having achieved remission: Secondary | ICD-10-CM

## 2022-06-17 DIAGNOSIS — I739 Peripheral vascular disease, unspecified: Secondary | ICD-10-CM | POA: Insufficient documentation

## 2022-06-17 DIAGNOSIS — D509 Iron deficiency anemia, unspecified: Secondary | ICD-10-CM | POA: Diagnosis not present

## 2022-06-17 DIAGNOSIS — C9211 Chronic myeloid leukemia, BCR/ABL-positive, in remission: Secondary | ICD-10-CM | POA: Diagnosis not present

## 2022-06-17 LAB — CMP (CANCER CENTER ONLY)
ALT: 26 U/L (ref 0–44)
AST: 23 U/L (ref 15–41)
Albumin: 4.2 g/dL (ref 3.5–5.0)
Alkaline Phosphatase: 57 U/L (ref 38–126)
Anion gap: 10 (ref 5–15)
BUN: 27 mg/dL — ABNORMAL HIGH (ref 8–23)
CO2: 26 mmol/L (ref 22–32)
Calcium: 9.5 mg/dL (ref 8.9–10.3)
Chloride: 100 mmol/L (ref 98–111)
Creatinine: 1.56 mg/dL — ABNORMAL HIGH (ref 0.44–1.00)
GFR, Estimated: 33 mL/min — ABNORMAL LOW (ref >60)
Glucose, Bld: 317 mg/dL — ABNORMAL HIGH (ref 70–99)
Potassium: 4.4 mmol/L (ref 3.5–5.1)
Sodium: 136 mmol/L (ref 135–145)
Total Bilirubin: 0.2 mg/dL — ABNORMAL LOW (ref 0.3–1.2)
Total Protein: 7 g/dL (ref 6.5–8.1)

## 2022-06-17 LAB — CBC WITH DIFFERENTIAL (CANCER CENTER ONLY)
Abs Immature Granulocytes: 0.18 10*3/uL — ABNORMAL HIGH (ref 0.00–0.07)
Basophils Absolute: 0.2 10*3/uL — ABNORMAL HIGH (ref 0.0–0.1)
Basophils Relative: 1 %
Eosinophils Absolute: 0.5 10*3/uL (ref 0.0–0.5)
Eosinophils Relative: 4 %
HCT: 38.7 % (ref 36.0–46.0)
Hemoglobin: 12 g/dL (ref 12.0–15.0)
Immature Granulocytes: 2 %
Lymphocytes Relative: 22 %
Lymphs Abs: 2.6 10*3/uL (ref 0.7–4.0)
MCH: 25.9 pg — ABNORMAL LOW (ref 26.0–34.0)
MCHC: 31 g/dL (ref 30.0–36.0)
MCV: 83.6 fL (ref 80.0–100.0)
Monocytes Absolute: 0.8 10*3/uL (ref 0.1–1.0)
Monocytes Relative: 7 %
Neutro Abs: 7.7 10*3/uL (ref 1.7–7.7)
Neutrophils Relative %: 64 %
Platelet Count: 379 10*3/uL (ref 150–400)
RBC: 4.63 MIL/uL (ref 3.87–5.11)
RDW: 21 % — ABNORMAL HIGH (ref 11.5–15.5)
WBC Count: 11.9 10*3/uL — ABNORMAL HIGH (ref 4.0–10.5)
nRBC: 0 % (ref 0.0–0.2)

## 2022-06-17 LAB — IRON AND IRON BINDING CAPACITY (CC-WL,HP ONLY)
Iron: 38 ug/dL (ref 28–170)
Saturation Ratios: 8 % — ABNORMAL LOW (ref 10.4–31.8)
TIBC: 469 ug/dL — ABNORMAL HIGH (ref 250–450)
UIBC: 431 ug/dL

## 2022-06-17 LAB — FERRITIN: Ferritin: 15 ng/mL (ref 11–307)

## 2022-06-17 NOTE — Progress Notes (Signed)
Hematology and Oncology Follow Up Visit  Sara Warner [id number] 13-Nov-1941 81 y.o. 06/17/2022 2:35 PM   Principle Diagnosis: 81 year old woman with chronic phase CML diagnosed in 2013..  Current treatment: Tasigna 300 mg bid started 11/2012 with complete hematological and molecular remission.  Therapy has been on hold due to health concerns in April 2022.  Interim History: Sara Warner returns today for a follow-up evaluation.  Since last visit, she reports no major changes in her health. Her left heel wound continues to heal slowly but continues to be on scheduled.  She follows at the wound clinic regularly.  She denies any chest pain shortness of breath.  She denies any bleeding complications.  Her performance status quality of life remains unchanged.     Medications: Updated on review. Current Outpatient Medications  Medication Sig Dispense Refill   amoxicillin-clavulanate (AUGMENTIN) 875-125 MG tablet Take 1 tablet by mouth 2 (two) times daily. 14 tablet 0   cetirizine (ZYRTEC) 10 MG tablet Take 10 mg by mouth daily.     clorazepate (TRANXENE-T) 7.5 MG tablet Take 1 tablet (7.5 mg total) by mouth 2 (two) times daily as needed for anxiety. 30 tablet 1   colchicine 0.6 MG tablet Take 0.6 mg by mouth 2 (two) times daily.     diclofenac Sodium (VOLTAREN) 1 % GEL SMARTSIG:1 Gram(s) Topical 4 Times Daily PRN     digoxin (LANOXIN) 0.125 MG tablet Take 0.5 tablets (0.0625 mg total) by mouth every other day. 45 tablet 1   Dulaglutide 3 MG/0.5ML SOPN Inject 3 mg into the skin once a week.     glipiZIDE (GLUCOTROL XL) 10 MG 24 hr tablet Take 10 mg by mouth 2 (two) times daily.      iron polysaccharides (NIFEREX) 150 MG capsule Take 150 mg by mouth daily.     metoprolol (TOPROL-XL) 200 MG 24 hr tablet Take 1 tablet by mouth daily. 90 tablet 3   nilotinib (TASIGNA) 150 MG capsule Take 2 capsules (300 mg total) by mouth every 12 (twelve) hours. Take on an empty stomach, 1 hour before or 2  hours after meals. 120 capsule 0   oxyCODONE-acetaminophen (PERCOCET/ROXICET) 5-325 MG tablet Take 1 tablet by mouth every 4 (four) hours as needed for severe pain. 30 tablet 0   pantoprazole (PROTONIX) 40 MG tablet Take 40 mg by mouth daily.     rosuvastatin (CRESTOR) 10 MG tablet Take 1 tablet (10 mg total) by mouth daily. 90 tablet 3   sitaGLIPtin (JANUVIA) 50 MG tablet Take 50 mg by mouth daily.     sucralfate (CARAFATE) 1 g tablet Take 1 g by mouth 4 (four) times daily.     SYNTHROID 100 MCG tablet Take 100 mcg by mouth daily.     torsemide (DEMADEX) 20 MG tablet Take 20 mg by mouth daily. Take 1 Tablet Daily.     No current facility-administered medications for this visit.    Allergies:  Allergies  Allergen Reactions   Lisinopril Cough   Losartan Other (See Comments)    Patient reports nightmares/vivid dreams when taking.       Physical Exam:    Blood pressure 118/65, pulse 89, temperature 97.6 F (36.4 C), temperature source Temporal, resp. rate 17, height '5\' 8"'$  (1.727 m), weight 190 lb 1.6 oz (86.2 kg), SpO2 95 %.    ECOG: 1   General appearance: Alert, awake without any distress. Head: Atraumatic without abnormalities Oropharynx: Without any thrush or ulcers. Eyes: No scleral icterus. Lymph nodes: No  lymphadenopathy noted in the cervical, supraclavicular, or axillary nodes Heart:regular rate and rhythm, without any murmurs or gallops.   Lung: Clear to auscultation without any rhonchi, wheezes or dullness to percussion. Abdomin: Soft, nontender without any shifting dullness or ascites. Musculoskeletal: No clubbing or cyanosis. Neurological: No motor or sensory deficits. Skin: No rashes or lesions.          Lab Results: Lab Results  Component Value Date   WBC 11.7 (H) 12/16/2021   HGB 13.2 12/16/2021   HCT 43.0 12/16/2021   MCV 83.5 12/16/2021   PLT 443 (H) 12/16/2021     Chemistry      Component Value Date/Time   NA 138 12/16/2021 0935   NA  140 03/22/2018 0957   NA 140 09/08/2017 1417   K 4.5 12/16/2021 0935   K 4.0 09/08/2017 1417   CL 103 12/16/2021 0935   CL 106 04/26/2013 1252   CO2 26 12/16/2021 0935   CO2 24 09/08/2017 1417   BUN 30 (H) 12/16/2021 0935   BUN 28 (H) 03/22/2018 0957   BUN 26.9 (H) 09/08/2017 1417   CREATININE 1.42 (H) 12/16/2021 0935   CREATININE 1.4 (H) 09/08/2017 1417      Component Value Date/Time   CALCIUM 9.9 12/16/2021 0935   CALCIUM 9.5 09/08/2017 1417   ALKPHOS 61 12/16/2021 0935   ALKPHOS 44 09/08/2017 1417   AST 11 (L) 12/16/2021 0935   AST 19 09/08/2017 1417   ALT 12 12/16/2021 0935   ALT 23 09/08/2017 1417   BILITOT 0.5 12/16/2021 0935   BILITOT 0.65 09/08/2017 1417         Impression and Plan:   81 year old woman with:   1.  CML diagnosed in 2013.  She achieved hematological and molecular remission after presenting with chronic phase.   Treatment has been on hold due to other health concerns and vascular disease.  Risks and benefits of restarting her treatment were discussed.  Complications that include GI toxicity, vascular issues among others were reiterated.  Alternatively, she could continue on active surveillance and restart treatment if she developed relapsed.  Laboratory data from today reviewed and continues to show mild elevation in her white cell count but otherwise her counts under excellent control.  After discussion today, she is agreeable to continue to hold treatment at this time.  She prefers not to go back on medication unless needed.   2.  Iron deficiency anemia: Her hemoglobin is normalized at this time.   3.  Peripheral vascular disease: Left heel wound continues to heal slowly and remains on schedule.  She follows with wound clinic.  4. Follow-up: In 6 months for repeat follow-up.  30  minutes were spent on this encounter.  Time was dedicated to reviewing laboratory data, disease status update, treatment choices and future plan of care  review.  Sara Warner 7/19/20232:35 PM

## 2022-06-25 ENCOUNTER — Telehealth: Payer: Self-pay | Admitting: Oncology

## 2022-06-25 NOTE — Telephone Encounter (Signed)
Called patient regarding 2024 appointment, patient has been called and notified. 

## 2022-06-26 DIAGNOSIS — L97522 Non-pressure chronic ulcer of other part of left foot with fat layer exposed: Secondary | ICD-10-CM | POA: Diagnosis not present

## 2022-06-26 DIAGNOSIS — I739 Peripheral vascular disease, unspecified: Secondary | ICD-10-CM | POA: Diagnosis not present

## 2022-06-26 DIAGNOSIS — E11621 Type 2 diabetes mellitus with foot ulcer: Secondary | ICD-10-CM | POA: Diagnosis not present

## 2022-06-26 DIAGNOSIS — L97422 Non-pressure chronic ulcer of left heel and midfoot with fat layer exposed: Secondary | ICD-10-CM | POA: Diagnosis not present

## 2022-07-09 DIAGNOSIS — L97522 Non-pressure chronic ulcer of other part of left foot with fat layer exposed: Secondary | ICD-10-CM | POA: Diagnosis not present

## 2022-07-09 DIAGNOSIS — I779 Disorder of arteries and arterioles, unspecified: Secondary | ICD-10-CM | POA: Diagnosis not present

## 2022-07-09 DIAGNOSIS — N183 Chronic kidney disease, stage 3 unspecified: Secondary | ICD-10-CM | POA: Diagnosis not present

## 2022-07-10 DIAGNOSIS — L97422 Non-pressure chronic ulcer of left heel and midfoot with fat layer exposed: Secondary | ICD-10-CM | POA: Diagnosis not present

## 2022-07-10 DIAGNOSIS — L97522 Non-pressure chronic ulcer of other part of left foot with fat layer exposed: Secondary | ICD-10-CM | POA: Diagnosis not present

## 2022-07-10 DIAGNOSIS — I739 Peripheral vascular disease, unspecified: Secondary | ICD-10-CM | POA: Diagnosis not present

## 2022-07-24 DIAGNOSIS — E11621 Type 2 diabetes mellitus with foot ulcer: Secondary | ICD-10-CM | POA: Diagnosis not present

## 2022-07-24 DIAGNOSIS — N183 Chronic kidney disease, stage 3 unspecified: Secondary | ICD-10-CM | POA: Diagnosis not present

## 2022-07-24 DIAGNOSIS — I739 Peripheral vascular disease, unspecified: Secondary | ICD-10-CM | POA: Diagnosis not present

## 2022-07-24 DIAGNOSIS — E1122 Type 2 diabetes mellitus with diabetic chronic kidney disease: Secondary | ICD-10-CM | POA: Diagnosis not present

## 2022-07-24 DIAGNOSIS — L97422 Non-pressure chronic ulcer of left heel and midfoot with fat layer exposed: Secondary | ICD-10-CM | POA: Diagnosis not present

## 2022-07-24 DIAGNOSIS — L97522 Non-pressure chronic ulcer of other part of left foot with fat layer exposed: Secondary | ICD-10-CM | POA: Diagnosis not present

## 2022-07-24 DIAGNOSIS — I129 Hypertensive chronic kidney disease with stage 1 through stage 4 chronic kidney disease, or unspecified chronic kidney disease: Secondary | ICD-10-CM | POA: Diagnosis not present

## 2022-07-24 DIAGNOSIS — E1151 Type 2 diabetes mellitus with diabetic peripheral angiopathy without gangrene: Secondary | ICD-10-CM | POA: Diagnosis not present

## 2022-07-30 DIAGNOSIS — I10 Essential (primary) hypertension: Secondary | ICD-10-CM | POA: Diagnosis not present

## 2022-07-30 DIAGNOSIS — N184 Chronic kidney disease, stage 4 (severe): Secondary | ICD-10-CM | POA: Diagnosis not present

## 2022-07-30 DIAGNOSIS — E782 Mixed hyperlipidemia: Secondary | ICD-10-CM | POA: Diagnosis not present

## 2022-08-12 DIAGNOSIS — E1151 Type 2 diabetes mellitus with diabetic peripheral angiopathy without gangrene: Secondary | ICD-10-CM | POA: Diagnosis not present

## 2022-08-12 DIAGNOSIS — N184 Chronic kidney disease, stage 4 (severe): Secondary | ICD-10-CM | POA: Diagnosis not present

## 2022-08-12 DIAGNOSIS — E890 Postprocedural hypothyroidism: Secondary | ICD-10-CM | POA: Diagnosis not present

## 2022-08-21 DIAGNOSIS — L97522 Non-pressure chronic ulcer of other part of left foot with fat layer exposed: Secondary | ICD-10-CM | POA: Diagnosis not present

## 2022-08-21 DIAGNOSIS — L97422 Non-pressure chronic ulcer of left heel and midfoot with fat layer exposed: Secondary | ICD-10-CM | POA: Diagnosis not present

## 2022-08-21 DIAGNOSIS — L97421 Non-pressure chronic ulcer of left heel and midfoot limited to breakdown of skin: Secondary | ICD-10-CM | POA: Diagnosis not present

## 2022-08-21 DIAGNOSIS — E11621 Type 2 diabetes mellitus with foot ulcer: Secondary | ICD-10-CM | POA: Diagnosis not present

## 2022-10-13 DIAGNOSIS — E890 Postprocedural hypothyroidism: Secondary | ICD-10-CM | POA: Diagnosis not present

## 2022-10-13 DIAGNOSIS — F419 Anxiety disorder, unspecified: Secondary | ICD-10-CM | POA: Diagnosis not present

## 2022-10-13 DIAGNOSIS — I739 Peripheral vascular disease, unspecified: Secondary | ICD-10-CM | POA: Diagnosis not present

## 2022-10-13 DIAGNOSIS — E1151 Type 2 diabetes mellitus with diabetic peripheral angiopathy without gangrene: Secondary | ICD-10-CM | POA: Diagnosis not present

## 2022-10-13 DIAGNOSIS — E782 Mixed hyperlipidemia: Secondary | ICD-10-CM | POA: Diagnosis not present

## 2022-10-13 DIAGNOSIS — C9110 Chronic lymphocytic leukemia of B-cell type not having achieved remission: Secondary | ICD-10-CM | POA: Diagnosis not present

## 2022-10-13 DIAGNOSIS — N184 Chronic kidney disease, stage 4 (severe): Secondary | ICD-10-CM | POA: Diagnosis not present

## 2022-10-13 DIAGNOSIS — I10 Essential (primary) hypertension: Secondary | ICD-10-CM | POA: Diagnosis not present

## 2022-10-13 DIAGNOSIS — M109 Gout, unspecified: Secondary | ICD-10-CM | POA: Diagnosis not present

## 2022-10-13 DIAGNOSIS — K2950 Unspecified chronic gastritis without bleeding: Secondary | ICD-10-CM | POA: Diagnosis not present

## 2022-10-15 DIAGNOSIS — I10 Essential (primary) hypertension: Secondary | ICD-10-CM | POA: Diagnosis not present

## 2022-10-15 DIAGNOSIS — L97522 Non-pressure chronic ulcer of other part of left foot with fat layer exposed: Secondary | ICD-10-CM | POA: Diagnosis not present

## 2022-10-15 DIAGNOSIS — I739 Peripheral vascular disease, unspecified: Secondary | ICD-10-CM | POA: Diagnosis not present

## 2022-10-15 DIAGNOSIS — E119 Type 2 diabetes mellitus without complications: Secondary | ICD-10-CM | POA: Diagnosis not present

## 2022-10-15 DIAGNOSIS — Z7901 Long term (current) use of anticoagulants: Secondary | ICD-10-CM | POA: Diagnosis not present

## 2022-10-15 DIAGNOSIS — Z95820 Peripheral vascular angioplasty status with implants and grafts: Secondary | ICD-10-CM | POA: Diagnosis not present

## 2022-10-15 DIAGNOSIS — I4891 Unspecified atrial fibrillation: Secondary | ICD-10-CM | POA: Diagnosis not present

## 2022-10-15 DIAGNOSIS — I779 Disorder of arteries and arterioles, unspecified: Secondary | ICD-10-CM | POA: Diagnosis not present

## 2022-10-15 DIAGNOSIS — Z87891 Personal history of nicotine dependence: Secondary | ICD-10-CM | POA: Diagnosis not present

## 2022-10-15 DIAGNOSIS — E785 Hyperlipidemia, unspecified: Secondary | ICD-10-CM | POA: Diagnosis not present

## 2022-10-15 DIAGNOSIS — Z7982 Long term (current) use of aspirin: Secondary | ICD-10-CM | POA: Diagnosis not present

## 2022-10-30 ENCOUNTER — Telehealth: Payer: Self-pay | Admitting: Oncology

## 2022-10-30 NOTE — Telephone Encounter (Signed)
Called patient regarding rescheduled January appointment due to provider's request. Patient is notified.

## 2022-11-10 ENCOUNTER — Other Ambulatory Visit: Payer: Self-pay

## 2022-11-11 DIAGNOSIS — I701 Atherosclerosis of renal artery: Secondary | ICD-10-CM | POA: Diagnosis not present

## 2022-11-11 DIAGNOSIS — D631 Anemia in chronic kidney disease: Secondary | ICD-10-CM | POA: Diagnosis not present

## 2022-11-11 DIAGNOSIS — I129 Hypertensive chronic kidney disease with stage 1 through stage 4 chronic kidney disease, or unspecified chronic kidney disease: Secondary | ICD-10-CM | POA: Diagnosis not present

## 2022-11-11 DIAGNOSIS — N183 Chronic kidney disease, stage 3 unspecified: Secondary | ICD-10-CM | POA: Diagnosis not present

## 2022-11-11 DIAGNOSIS — N2581 Secondary hyperparathyroidism of renal origin: Secondary | ICD-10-CM | POA: Diagnosis not present

## 2022-11-16 DIAGNOSIS — H2513 Age-related nuclear cataract, bilateral: Secondary | ICD-10-CM | POA: Diagnosis not present

## 2022-11-16 DIAGNOSIS — E119 Type 2 diabetes mellitus without complications: Secondary | ICD-10-CM | POA: Diagnosis not present

## 2022-12-03 DIAGNOSIS — I779 Disorder of arteries and arterioles, unspecified: Secondary | ICD-10-CM | POA: Diagnosis not present

## 2022-12-03 DIAGNOSIS — I129 Hypertensive chronic kidney disease with stage 1 through stage 4 chronic kidney disease, or unspecified chronic kidney disease: Secondary | ICD-10-CM | POA: Diagnosis not present

## 2022-12-03 DIAGNOSIS — Z7982 Long term (current) use of aspirin: Secondary | ICD-10-CM | POA: Diagnosis not present

## 2022-12-03 DIAGNOSIS — I701 Atherosclerosis of renal artery: Secondary | ICD-10-CM | POA: Diagnosis not present

## 2022-12-03 DIAGNOSIS — Z87891 Personal history of nicotine dependence: Secondary | ICD-10-CM | POA: Diagnosis not present

## 2022-12-03 DIAGNOSIS — I4891 Unspecified atrial fibrillation: Secondary | ICD-10-CM | POA: Diagnosis not present

## 2022-12-03 DIAGNOSIS — N189 Chronic kidney disease, unspecified: Secondary | ICD-10-CM | POA: Diagnosis not present

## 2022-12-03 DIAGNOSIS — Z95820 Peripheral vascular angioplasty status with implants and grafts: Secondary | ICD-10-CM | POA: Diagnosis not present

## 2022-12-03 DIAGNOSIS — I70249 Atherosclerosis of native arteries of left leg with ulceration of unspecified site: Secondary | ICD-10-CM | POA: Insufficient documentation

## 2022-12-11 ENCOUNTER — Inpatient Hospital Stay: Payer: PPO | Attending: Oncology

## 2022-12-11 ENCOUNTER — Other Ambulatory Visit: Payer: Self-pay

## 2022-12-11 ENCOUNTER — Inpatient Hospital Stay (HOSPITAL_BASED_OUTPATIENT_CLINIC_OR_DEPARTMENT_OTHER): Payer: PPO | Admitting: Oncology

## 2022-12-11 VITALS — BP 120/51 | HR 89 | Temp 98.1°F | Resp 17 | Ht 68.0 in | Wt 195.7 lb

## 2022-12-11 DIAGNOSIS — I739 Peripheral vascular disease, unspecified: Secondary | ICD-10-CM | POA: Insufficient documentation

## 2022-12-11 DIAGNOSIS — C9210 Chronic myeloid leukemia, BCR/ABL-positive, not having achieved remission: Secondary | ICD-10-CM | POA: Diagnosis not present

## 2022-12-11 DIAGNOSIS — D509 Iron deficiency anemia, unspecified: Secondary | ICD-10-CM | POA: Insufficient documentation

## 2022-12-11 DIAGNOSIS — C9211 Chronic myeloid leukemia, BCR/ABL-positive, in remission: Secondary | ICD-10-CM | POA: Insufficient documentation

## 2022-12-11 LAB — CMP (CANCER CENTER ONLY)
ALT: 20 U/L (ref 0–44)
AST: 17 U/L (ref 15–41)
Albumin: 4.4 g/dL (ref 3.5–5.0)
Alkaline Phosphatase: 43 U/L (ref 38–126)
Anion gap: 6 (ref 5–15)
BUN: 28 mg/dL — ABNORMAL HIGH (ref 8–23)
CO2: 26 mmol/L (ref 22–32)
Calcium: 9.9 mg/dL (ref 8.9–10.3)
Chloride: 106 mmol/L (ref 98–111)
Creatinine: 1.14 mg/dL — ABNORMAL HIGH (ref 0.44–1.00)
GFR, Estimated: 48 mL/min — ABNORMAL LOW (ref >60)
Glucose, Bld: 127 mg/dL — ABNORMAL HIGH (ref 70–99)
Potassium: 4.5 mmol/L (ref 3.5–5.1)
Sodium: 138 mmol/L (ref 135–145)
Total Bilirubin: 0.6 mg/dL (ref 0.3–1.2)
Total Protein: 7 g/dL (ref 6.5–8.1)

## 2022-12-11 LAB — CBC WITH DIFFERENTIAL (CANCER CENTER ONLY)
Abs Immature Granulocytes: 0.07 10*3/uL (ref 0.00–0.07)
Basophils Absolute: 0.1 10*3/uL (ref 0.0–0.1)
Basophils Relative: 2 %
Eosinophils Absolute: 0.4 10*3/uL (ref 0.0–0.5)
Eosinophils Relative: 4 %
HCT: 39.9 % (ref 36.0–46.0)
Hemoglobin: 13.2 g/dL (ref 12.0–15.0)
Immature Granulocytes: 1 %
Lymphocytes Relative: 29 %
Lymphs Abs: 2.7 10*3/uL (ref 0.7–4.0)
MCH: 31.9 pg (ref 26.0–34.0)
MCHC: 33.1 g/dL (ref 30.0–36.0)
MCV: 96.4 fL (ref 80.0–100.0)
Monocytes Absolute: 0.6 10*3/uL (ref 0.1–1.0)
Monocytes Relative: 7 %
Neutro Abs: 5.4 10*3/uL (ref 1.7–7.7)
Neutrophils Relative %: 57 %
Platelet Count: 373 10*3/uL (ref 150–400)
RBC: 4.14 MIL/uL (ref 3.87–5.11)
RDW: 16.4 % — ABNORMAL HIGH (ref 11.5–15.5)
WBC Count: 9.3 10*3/uL (ref 4.0–10.5)
nRBC: 0 % (ref 0.0–0.2)

## 2022-12-11 LAB — IRON AND IRON BINDING CAPACITY (CC-WL,HP ONLY)
Iron: 57 ug/dL (ref 28–170)
Saturation Ratios: 14 % (ref 10.4–31.8)
TIBC: 410 ug/dL (ref 250–450)
UIBC: 353 ug/dL (ref 148–442)

## 2022-12-11 LAB — FERRITIN: Ferritin: 29 ng/mL (ref 11–307)

## 2022-12-11 NOTE — Progress Notes (Signed)
Hematology and Oncology Follow Up Visit  Sara Warner [id number] January 18, 1941 82 y.o. 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  She presented with chronic phase and achieved with logical molecular remission.  Current treatment: Tasigna 300 mg bid started 11/2012 with complete hematological and molecular remission.  Therapy has been on hold due to health concerns in April 2022.  Interim History: Sara Warner is here for a follow-up visit.  Since last visit, she reports no major changes in her health.  She continues to be under evaluation for peripheral vascular disease and she will have procedure scheduled on January 26. Her heel wound is healing although slowly at this time.  She denies any fevers, chills or sweats.  She denies any constitutional symptoms.     Medications: Reviewed without changes. Current Outpatient Medications  Medication Sig Dispense Refill   amoxicillin-clavulanate (AUGMENTIN) 875-125 MG tablet Take 1 tablet by mouth 2 (two) times daily. 14 tablet 0   cetirizine (ZYRTEC) 10 MG tablet Take 10 mg by mouth daily.     clorazepate (TRANXENE-T) 7.5 MG tablet Take 1 tablet (7.5 mg total) by mouth 2 (two) times daily as needed for anxiety. 30 tablet 1   colchicine 0.6 MG tablet Take 0.6 mg by mouth 2 (two) times daily.     diclofenac Sodium (VOLTAREN) 1 % GEL SMARTSIG:1 Gram(s) Topical 4 Times Daily PRN     digoxin (LANOXIN) 0.125 MG tablet Take 0.5 tablets (0.0625 mg total) by mouth every other day. 45 tablet 1   Dulaglutide 3 MG/0.5ML SOPN Inject 3 mg into the skin once a week.     glipiZIDE (GLUCOTROL XL) 10 MG 24 hr tablet Take 10 mg by mouth 2 (two) times daily.      iron polysaccharides (NIFEREX) 150 MG capsule Take 150 mg by mouth daily.     metoprolol (TOPROL-XL) 200 MG 24 hr tablet Take 1 tablet by mouth daily. 90 tablet 3   nilotinib (TASIGNA) 150 MG capsule Take 2 capsules (300 mg total) by mouth every 12 (twelve)  hours. Take on an empty stomach, 1 hour before or 2 hours after meals. 120 capsule 0   oxyCODONE-acetaminophen (PERCOCET/ROXICET) 5-325 MG tablet Take 1 tablet by mouth every 4 (four) hours as needed for severe pain. 30 tablet 0   pantoprazole (PROTONIX) 40 MG tablet Take 40 mg by mouth daily.     rosuvastatin (CRESTOR) 10 MG tablet Take 1 tablet (10 mg total) by mouth daily. 90 tablet 3   sitaGLIPtin (JANUVIA) 50 MG tablet Take 50 mg by mouth daily.     sucralfate (CARAFATE) 1 g tablet Take 1 g by mouth 4 (four) times daily.     SYNTHROID 100 MCG tablet Take 100 mcg by mouth daily.     torsemide (DEMADEX) 20 MG tablet Take 20 mg by mouth daily. Take 1 Tablet Daily.     No current facility-administered medications for this visit.    Allergies:  Allergies  Allergen Reactions   Lisinopril Cough   Losartan Other (See Comments)    Patient reports nightmares/vivid dreams when taking.       Physical Exam:    Blood pressure (!) 120/51, pulse 89, temperature 98.1 F (36.7 C), temperature source Temporal, resp. rate 17, height '5\' 8"'$  (1.727 m), weight 195 lb 11.2 oz (88.8 kg), SpO2 100 %.     ECOG: 1   General appearance: Comfortable appearing without any discomfort Head: Normocephalic without any trauma Oropharynx: Mucous  membranes are moist and pink without any thrush or ulcers. Eyes: Pupils are equal and round reactive to light. Lymph nodes: No cervical, supraclavicular, inguinal or axillary lymphadenopathy.   Heart:regular rate and rhythm.  S1 and S2 without leg edema. Lung: Clear without any rhonchi or wheezes.  No dullness to percussion. Abdomin: Soft, nontender, nondistended with good bowel sounds.  No hepatosplenomegaly. Musculoskeletal: No joint deformity or effusion.  Full range of motion noted. Neurological: No deficits noted on motor, sensory and deep tendon reflex exam. Skin: No petechial rash or dryness.  Appeared moist.            Lab Results: Lab  Results  Component Value Date   WBC 11.9 (H) 06/17/2022   HGB 12.0 06/17/2022   HCT 38.7 06/17/2022   MCV 83.6 06/17/2022   PLT 379 06/17/2022     Chemistry      Component Value Date/Time   NA 136 06/17/2022 1429   NA 140 03/22/2018 0957   NA 140 09/08/2017 1417   K 4.4 06/17/2022 1429   K 4.0 09/08/2017 1417   CL 100 06/17/2022 1429   CL 106 04/26/2013 1252   CO2 26 06/17/2022 1429   CO2 24 09/08/2017 1417   BUN 27 (H) 06/17/2022 1429   BUN 28 (H) 03/22/2018 0957   BUN 26.9 (H) 09/08/2017 1417   CREATININE 1.56 (H) 06/17/2022 1429   CREATININE 1.4 (H) 09/08/2017 1417      Component Value Date/Time   CALCIUM 9.5 06/17/2022 1429   CALCIUM 9.5 09/08/2017 1417   ALKPHOS 57 06/17/2022 1429   ALKPHOS 44 09/08/2017 1417   AST 23 06/17/2022 1429   AST 19 09/08/2017 1417   ALT 26 06/17/2022 1429   ALT 23 09/08/2017 1417   BILITOT 0.2 (L) 06/17/2022 1429   BILITOT 0.65 09/08/2017 1417         Impression and Plan:   82 year old woman with:   1.  Chronic phase CML diagnosed in 2013.  She achieved molecular and hematological remission but off treatment due to vascular disease and health issues.   The natural course of her disease was reviewed and treatment options were discussed.  Restarting Tasigna was discussed today including risks and benefits and potential complications.  The risk with holding therapy long-term including disease relapse and transformation to accelerated phase and possible blast phase were reviewed.  CBC was reviewed today and continues to show normal CBC at this time.  After discussion today, we opted to continue with surveillance and reinstitute therapy if she develops disease progression.   2.  Iron deficiency anemia: Hemoglobin continues to improve at this time with iron replacement.   3.  Peripheral vascular disease: Continues to follow with vascular surgery with angiography and intervention planned in the near future.  4. Follow-up: In 6  months for a follow-up visit.  30  minutes were spent on this encounter.  The time was dedicated to updating disease status, treatment choices and outlining future plan of care discussion.  Sara Warner 1/12/202412:55 PM

## 2022-12-18 ENCOUNTER — Other Ambulatory Visit: Payer: PPO

## 2022-12-18 ENCOUNTER — Ambulatory Visit: Payer: PPO | Admitting: Oncology

## 2022-12-18 DIAGNOSIS — Z01810 Encounter for preprocedural cardiovascular examination: Secondary | ICD-10-CM | POA: Diagnosis not present

## 2022-12-18 DIAGNOSIS — I4891 Unspecified atrial fibrillation: Secondary | ICD-10-CM | POA: Diagnosis not present

## 2022-12-18 DIAGNOSIS — I739 Peripheral vascular disease, unspecified: Secondary | ICD-10-CM | POA: Diagnosis not present

## 2022-12-22 DIAGNOSIS — I4891 Unspecified atrial fibrillation: Secondary | ICD-10-CM | POA: Diagnosis not present

## 2022-12-22 DIAGNOSIS — Z01810 Encounter for preprocedural cardiovascular examination: Secondary | ICD-10-CM | POA: Diagnosis not present

## 2022-12-25 DIAGNOSIS — Z95828 Presence of other vascular implants and grafts: Secondary | ICD-10-CM | POA: Diagnosis not present

## 2022-12-25 DIAGNOSIS — T82858A Stenosis of vascular prosthetic devices, implants and grafts, initial encounter: Secondary | ICD-10-CM | POA: Diagnosis not present

## 2022-12-25 DIAGNOSIS — E1122 Type 2 diabetes mellitus with diabetic chronic kidney disease: Secondary | ICD-10-CM | POA: Diagnosis not present

## 2022-12-25 DIAGNOSIS — E039 Hypothyroidism, unspecified: Secondary | ICD-10-CM | POA: Diagnosis not present

## 2022-12-25 DIAGNOSIS — Z9221 Personal history of antineoplastic chemotherapy: Secondary | ICD-10-CM | POA: Diagnosis not present

## 2022-12-25 DIAGNOSIS — Z9889 Other specified postprocedural states: Secondary | ICD-10-CM | POA: Diagnosis not present

## 2022-12-25 DIAGNOSIS — D631 Anemia in chronic kidney disease: Secondary | ICD-10-CM | POA: Diagnosis not present

## 2022-12-25 DIAGNOSIS — Z7984 Long term (current) use of oral hypoglycemic drugs: Secondary | ICD-10-CM | POA: Diagnosis not present

## 2022-12-25 DIAGNOSIS — I739 Peripheral vascular disease, unspecified: Secondary | ICD-10-CM | POA: Diagnosis not present

## 2022-12-25 DIAGNOSIS — Z7901 Long term (current) use of anticoagulants: Secondary | ICD-10-CM | POA: Diagnosis not present

## 2022-12-25 DIAGNOSIS — E669 Obesity, unspecified: Secondary | ICD-10-CM | POA: Diagnosis not present

## 2022-12-25 DIAGNOSIS — R2689 Other abnormalities of gait and mobility: Secondary | ICD-10-CM | POA: Diagnosis not present

## 2022-12-25 DIAGNOSIS — Z87891 Personal history of nicotine dependence: Secondary | ICD-10-CM | POA: Diagnosis not present

## 2022-12-25 DIAGNOSIS — I4891 Unspecified atrial fibrillation: Secondary | ICD-10-CM | POA: Diagnosis not present

## 2022-12-25 DIAGNOSIS — Z79899 Other long term (current) drug therapy: Secondary | ICD-10-CM | POA: Diagnosis not present

## 2022-12-25 DIAGNOSIS — Z6829 Body mass index (BMI) 29.0-29.9, adult: Secondary | ICD-10-CM | POA: Diagnosis not present

## 2022-12-25 DIAGNOSIS — I779 Disorder of arteries and arterioles, unspecified: Secondary | ICD-10-CM | POA: Diagnosis not present

## 2022-12-25 DIAGNOSIS — N183 Chronic kidney disease, stage 3 unspecified: Secondary | ICD-10-CM | POA: Diagnosis not present

## 2022-12-25 DIAGNOSIS — E785 Hyperlipidemia, unspecified: Secondary | ICD-10-CM | POA: Diagnosis not present

## 2022-12-25 DIAGNOSIS — C9210 Chronic myeloid leukemia, BCR/ABL-positive, not having achieved remission: Secondary | ICD-10-CM | POA: Diagnosis not present

## 2022-12-25 DIAGNOSIS — I129 Hypertensive chronic kidney disease with stage 1 through stage 4 chronic kidney disease, or unspecified chronic kidney disease: Secondary | ICD-10-CM | POA: Diagnosis not present

## 2023-01-21 DIAGNOSIS — Z7902 Long term (current) use of antithrombotics/antiplatelets: Secondary | ICD-10-CM | POA: Diagnosis not present

## 2023-01-21 DIAGNOSIS — Z95820 Peripheral vascular angioplasty status with implants and grafts: Secondary | ICD-10-CM | POA: Diagnosis not present

## 2023-01-21 DIAGNOSIS — I739 Peripheral vascular disease, unspecified: Secondary | ICD-10-CM | POA: Diagnosis not present

## 2023-01-21 DIAGNOSIS — Z7901 Long term (current) use of anticoagulants: Secondary | ICD-10-CM | POA: Diagnosis not present

## 2023-01-21 DIAGNOSIS — Z9889 Other specified postprocedural states: Secondary | ICD-10-CM | POA: Diagnosis not present

## 2023-01-24 ENCOUNTER — Other Ambulatory Visit: Payer: Self-pay | Admitting: Adult Health

## 2023-01-26 DIAGNOSIS — I10 Essential (primary) hypertension: Secondary | ICD-10-CM | POA: Diagnosis not present

## 2023-01-26 DIAGNOSIS — C9110 Chronic lymphocytic leukemia of B-cell type not having achieved remission: Secondary | ICD-10-CM | POA: Diagnosis not present

## 2023-01-26 DIAGNOSIS — I739 Peripheral vascular disease, unspecified: Secondary | ICD-10-CM | POA: Diagnosis not present

## 2023-01-26 DIAGNOSIS — E782 Mixed hyperlipidemia: Secondary | ICD-10-CM | POA: Diagnosis not present

## 2023-01-26 DIAGNOSIS — E1151 Type 2 diabetes mellitus with diabetic peripheral angiopathy without gangrene: Secondary | ICD-10-CM | POA: Diagnosis not present

## 2023-01-26 DIAGNOSIS — E890 Postprocedural hypothyroidism: Secondary | ICD-10-CM | POA: Diagnosis not present

## 2023-01-26 DIAGNOSIS — N184 Chronic kidney disease, stage 4 (severe): Secondary | ICD-10-CM | POA: Diagnosis not present

## 2023-01-26 DIAGNOSIS — M19049 Primary osteoarthritis, unspecified hand: Secondary | ICD-10-CM | POA: Diagnosis not present

## 2023-01-26 DIAGNOSIS — M109 Gout, unspecified: Secondary | ICD-10-CM | POA: Diagnosis not present

## 2023-01-26 DIAGNOSIS — F419 Anxiety disorder, unspecified: Secondary | ICD-10-CM | POA: Diagnosis not present

## 2023-01-26 DIAGNOSIS — K2950 Unspecified chronic gastritis without bleeding: Secondary | ICD-10-CM | POA: Diagnosis not present

## 2023-01-26 DIAGNOSIS — Z1331 Encounter for screening for depression: Secondary | ICD-10-CM | POA: Diagnosis not present

## 2023-02-28 DIAGNOSIS — I10 Essential (primary) hypertension: Secondary | ICD-10-CM | POA: Diagnosis not present

## 2023-02-28 DIAGNOSIS — E782 Mixed hyperlipidemia: Secondary | ICD-10-CM | POA: Diagnosis not present

## 2023-02-28 DIAGNOSIS — I509 Heart failure, unspecified: Secondary | ICD-10-CM | POA: Diagnosis not present

## 2023-02-28 DIAGNOSIS — I4891 Unspecified atrial fibrillation: Secondary | ICD-10-CM | POA: Diagnosis not present

## 2023-03-08 DIAGNOSIS — H2512 Age-related nuclear cataract, left eye: Secondary | ICD-10-CM | POA: Diagnosis not present

## 2023-03-08 DIAGNOSIS — H35372 Puckering of macula, left eye: Secondary | ICD-10-CM | POA: Diagnosis not present

## 2023-03-30 DIAGNOSIS — E782 Mixed hyperlipidemia: Secondary | ICD-10-CM | POA: Diagnosis not present

## 2023-03-30 DIAGNOSIS — I509 Heart failure, unspecified: Secondary | ICD-10-CM | POA: Diagnosis not present

## 2023-03-30 DIAGNOSIS — I10 Essential (primary) hypertension: Secondary | ICD-10-CM | POA: Diagnosis not present

## 2023-03-30 DIAGNOSIS — I4891 Unspecified atrial fibrillation: Secondary | ICD-10-CM | POA: Diagnosis not present

## 2023-04-08 DIAGNOSIS — I480 Paroxysmal atrial fibrillation: Secondary | ICD-10-CM | POA: Diagnosis not present

## 2023-04-08 DIAGNOSIS — E1122 Type 2 diabetes mellitus with diabetic chronic kidney disease: Secondary | ICD-10-CM | POA: Diagnosis not present

## 2023-04-08 DIAGNOSIS — N183 Chronic kidney disease, stage 3 unspecified: Secondary | ICD-10-CM | POA: Diagnosis not present

## 2023-04-08 DIAGNOSIS — Z7901 Long term (current) use of anticoagulants: Secondary | ICD-10-CM | POA: Diagnosis not present

## 2023-04-08 DIAGNOSIS — I5020 Unspecified systolic (congestive) heart failure: Secondary | ICD-10-CM | POA: Diagnosis not present

## 2023-04-08 DIAGNOSIS — I429 Cardiomyopathy, unspecified: Secondary | ICD-10-CM | POA: Diagnosis not present

## 2023-04-08 DIAGNOSIS — Z79890 Hormone replacement therapy (postmenopausal): Secondary | ICD-10-CM | POA: Diagnosis not present

## 2023-04-08 DIAGNOSIS — E0590 Thyrotoxicosis, unspecified without thyrotoxic crisis or storm: Secondary | ICD-10-CM | POA: Diagnosis not present

## 2023-04-08 DIAGNOSIS — E1142 Type 2 diabetes mellitus with diabetic polyneuropathy: Secondary | ICD-10-CM | POA: Diagnosis not present

## 2023-04-08 DIAGNOSIS — D6869 Other thrombophilia: Secondary | ICD-10-CM | POA: Diagnosis not present

## 2023-04-08 DIAGNOSIS — E261 Secondary hyperaldosteronism: Secondary | ICD-10-CM | POA: Diagnosis not present

## 2023-04-08 DIAGNOSIS — Z6829 Body mass index (BMI) 29.0-29.9, adult: Secondary | ICD-10-CM | POA: Diagnosis not present

## 2023-04-15 DIAGNOSIS — D6869 Other thrombophilia: Secondary | ICD-10-CM | POA: Diagnosis not present

## 2023-04-15 DIAGNOSIS — E785 Hyperlipidemia, unspecified: Secondary | ICD-10-CM | POA: Diagnosis not present

## 2023-04-15 DIAGNOSIS — I4891 Unspecified atrial fibrillation: Secondary | ICD-10-CM | POA: Diagnosis not present

## 2023-04-15 DIAGNOSIS — I739 Peripheral vascular disease, unspecified: Secondary | ICD-10-CM | POA: Diagnosis not present

## 2023-04-15 DIAGNOSIS — I10 Essential (primary) hypertension: Secondary | ICD-10-CM | POA: Diagnosis not present

## 2023-04-15 DIAGNOSIS — I779 Disorder of arteries and arterioles, unspecified: Secondary | ICD-10-CM | POA: Diagnosis not present

## 2023-04-15 DIAGNOSIS — Z7901 Long term (current) use of anticoagulants: Secondary | ICD-10-CM | POA: Diagnosis not present

## 2023-04-15 DIAGNOSIS — Z95820 Peripheral vascular angioplasty status with implants and grafts: Secondary | ICD-10-CM | POA: Diagnosis not present

## 2023-04-15 DIAGNOSIS — Z7902 Long term (current) use of antithrombotics/antiplatelets: Secondary | ICD-10-CM | POA: Diagnosis not present

## 2023-04-15 DIAGNOSIS — E119 Type 2 diabetes mellitus without complications: Secondary | ICD-10-CM | POA: Diagnosis not present

## 2023-04-19 DIAGNOSIS — Z01818 Encounter for other preprocedural examination: Secondary | ICD-10-CM | POA: Diagnosis not present

## 2023-04-19 DIAGNOSIS — N183 Chronic kidney disease, stage 3 unspecified: Secondary | ICD-10-CM | POA: Diagnosis not present

## 2023-04-20 DIAGNOSIS — Z87891 Personal history of nicotine dependence: Secondary | ICD-10-CM | POA: Diagnosis not present

## 2023-04-20 DIAGNOSIS — D509 Iron deficiency anemia, unspecified: Secondary | ICD-10-CM | POA: Diagnosis not present

## 2023-04-20 DIAGNOSIS — C9210 Chronic myeloid leukemia, BCR/ABL-positive, not having achieved remission: Secondary | ICD-10-CM | POA: Diagnosis not present

## 2023-04-20 DIAGNOSIS — Z7902 Long term (current) use of antithrombotics/antiplatelets: Secondary | ICD-10-CM | POA: Diagnosis not present

## 2023-04-20 DIAGNOSIS — Z7984 Long term (current) use of oral hypoglycemic drugs: Secondary | ICD-10-CM | POA: Diagnosis not present

## 2023-04-20 DIAGNOSIS — Z95828 Presence of other vascular implants and grafts: Secondary | ICD-10-CM | POA: Diagnosis not present

## 2023-04-20 DIAGNOSIS — E119 Type 2 diabetes mellitus without complications: Secondary | ICD-10-CM | POA: Diagnosis not present

## 2023-04-20 DIAGNOSIS — Z79899 Other long term (current) drug therapy: Secondary | ICD-10-CM | POA: Diagnosis not present

## 2023-04-20 DIAGNOSIS — I482 Chronic atrial fibrillation, unspecified: Secondary | ICD-10-CM | POA: Diagnosis not present

## 2023-04-20 DIAGNOSIS — I70202 Unspecified atherosclerosis of native arteries of extremities, left leg: Secondary | ICD-10-CM | POA: Diagnosis not present

## 2023-04-20 DIAGNOSIS — E039 Hypothyroidism, unspecified: Secondary | ICD-10-CM | POA: Diagnosis not present

## 2023-04-20 DIAGNOSIS — N183 Chronic kidney disease, stage 3 unspecified: Secondary | ICD-10-CM | POA: Diagnosis not present

## 2023-04-20 DIAGNOSIS — I7789 Other specified disorders of arteries and arterioles: Secondary | ICD-10-CM | POA: Diagnosis not present

## 2023-04-20 DIAGNOSIS — T82868A Thrombosis of vascular prosthetic devices, implants and grafts, initial encounter: Secondary | ICD-10-CM | POA: Diagnosis not present

## 2023-04-23 ENCOUNTER — Telehealth: Payer: Self-pay

## 2023-04-23 DIAGNOSIS — H2512 Age-related nuclear cataract, left eye: Secondary | ICD-10-CM | POA: Diagnosis not present

## 2023-04-23 NOTE — Patient Outreach (Signed)
  Care Coordination   Initial Visit Note   04/23/2023 Name: Hadlynn Denke MRN: [id number] DOB: [birth year]  Tareva Leven is a 82 y.o. year old female who sees [name], [place] for primary care. I spoke with  Salome Holmes by phone today.  What matters to the patients health and wellness today?  Placed call to patient today to review and offer [facility] care coordination program. Patient reports that she is doing well with the exception of the cost of her Eliquis.  Referral place to pharmacy team.       SDOH assessments and interventions completed:  No     Care Coordination Interventions:  Yes, provided   Follow up plan: Referral made to pharmacy    Encounter Outcome:  Pt. Visit Completed   Rowe Pavy, RN, BSN, CEN [facility] Coordinator [phone]

## 2023-04-23 NOTE — Addendum Note (Signed)
Addended by: Rockne Menghini on: 04/23/2023 03:14 PM   Modules accepted: Orders

## 2023-04-30 DIAGNOSIS — I10 Essential (primary) hypertension: Secondary | ICD-10-CM | POA: Diagnosis not present

## 2023-04-30 DIAGNOSIS — I509 Heart failure, unspecified: Secondary | ICD-10-CM | POA: Diagnosis not present

## 2023-04-30 DIAGNOSIS — E782 Mixed hyperlipidemia: Secondary | ICD-10-CM | POA: Diagnosis not present

## 2023-04-30 DIAGNOSIS — I4891 Unspecified atrial fibrillation: Secondary | ICD-10-CM | POA: Diagnosis not present

## 2023-05-07 DIAGNOSIS — H2511 Age-related nuclear cataract, right eye: Secondary | ICD-10-CM | POA: Diagnosis not present

## 2023-05-18 DIAGNOSIS — R609 Edema, unspecified: Secondary | ICD-10-CM | POA: Diagnosis not present

## 2023-05-18 DIAGNOSIS — N183 Chronic kidney disease, stage 3 unspecified: Secondary | ICD-10-CM | POA: Diagnosis not present

## 2023-05-18 DIAGNOSIS — I129 Hypertensive chronic kidney disease with stage 1 through stage 4 chronic kidney disease, or unspecified chronic kidney disease: Secondary | ICD-10-CM | POA: Diagnosis not present

## 2023-05-18 DIAGNOSIS — I701 Atherosclerosis of renal artery: Secondary | ICD-10-CM | POA: Diagnosis not present

## 2023-05-18 DIAGNOSIS — N189 Chronic kidney disease, unspecified: Secondary | ICD-10-CM | POA: Diagnosis not present

## 2023-05-18 DIAGNOSIS — D631 Anemia in chronic kidney disease: Secondary | ICD-10-CM | POA: Diagnosis not present

## 2023-05-18 DIAGNOSIS — N2581 Secondary hyperparathyroidism of renal origin: Secondary | ICD-10-CM | POA: Diagnosis not present

## 2023-05-25 DIAGNOSIS — L89622 Pressure ulcer of left heel, stage 2: Secondary | ICD-10-CM | POA: Diagnosis not present

## 2023-05-25 DIAGNOSIS — I70244 Atherosclerosis of native arteries of left leg with ulceration of heel and midfoot: Secondary | ICD-10-CM | POA: Diagnosis not present

## 2023-06-05 ENCOUNTER — Other Ambulatory Visit: Payer: Self-pay | Admitting: Adult Health

## 2023-06-08 ENCOUNTER — Other Ambulatory Visit: Payer: Self-pay

## 2023-06-08 DIAGNOSIS — Z95820 Peripheral vascular angioplasty status with implants and grafts: Secondary | ICD-10-CM | POA: Diagnosis not present

## 2023-06-08 DIAGNOSIS — L97522 Non-pressure chronic ulcer of other part of left foot with fat layer exposed: Secondary | ICD-10-CM | POA: Diagnosis not present

## 2023-06-08 DIAGNOSIS — E1151 Type 2 diabetes mellitus with diabetic peripheral angiopathy without gangrene: Secondary | ICD-10-CM | POA: Diagnosis not present

## 2023-06-08 DIAGNOSIS — C9210 Chronic myeloid leukemia, BCR/ABL-positive, not having achieved remission: Secondary | ICD-10-CM

## 2023-06-08 DIAGNOSIS — E11621 Type 2 diabetes mellitus with foot ulcer: Secondary | ICD-10-CM | POA: Diagnosis not present

## 2023-06-08 DIAGNOSIS — L97422 Non-pressure chronic ulcer of left heel and midfoot with fat layer exposed: Secondary | ICD-10-CM | POA: Diagnosis not present

## 2023-06-08 DIAGNOSIS — I70544 Atherosclerosis of nonautologous biological bypass graft(s) of the left leg with ulceration of heel and midfoot: Secondary | ICD-10-CM | POA: Diagnosis not present

## 2023-06-09 ENCOUNTER — Inpatient Hospital Stay: Payer: PPO | Attending: Hematology

## 2023-06-09 ENCOUNTER — Inpatient Hospital Stay (HOSPITAL_BASED_OUTPATIENT_CLINIC_OR_DEPARTMENT_OTHER): Payer: PPO | Admitting: Hematology

## 2023-06-09 ENCOUNTER — Other Ambulatory Visit: Payer: Self-pay

## 2023-06-09 VITALS — BP 111/56 | HR 70 | Temp 97.5°F | Resp 18 | Wt 185.5 lb

## 2023-06-09 DIAGNOSIS — D509 Iron deficiency anemia, unspecified: Secondary | ICD-10-CM | POA: Insufficient documentation

## 2023-06-09 DIAGNOSIS — Z87891 Personal history of nicotine dependence: Secondary | ICD-10-CM | POA: Insufficient documentation

## 2023-06-09 DIAGNOSIS — Z800 Family history of malignant neoplasm of digestive organs: Secondary | ICD-10-CM | POA: Diagnosis not present

## 2023-06-09 DIAGNOSIS — C9210 Chronic myeloid leukemia, BCR/ABL-positive, not having achieved remission: Secondary | ICD-10-CM | POA: Insufficient documentation

## 2023-06-09 DIAGNOSIS — Z90710 Acquired absence of both cervix and uterus: Secondary | ICD-10-CM | POA: Diagnosis not present

## 2023-06-09 DIAGNOSIS — N189 Chronic kidney disease, unspecified: Secondary | ICD-10-CM | POA: Diagnosis not present

## 2023-06-09 DIAGNOSIS — E1151 Type 2 diabetes mellitus with diabetic peripheral angiopathy without gangrene: Secondary | ICD-10-CM | POA: Diagnosis not present

## 2023-06-09 DIAGNOSIS — I739 Peripheral vascular disease, unspecified: Secondary | ICD-10-CM | POA: Insufficient documentation

## 2023-06-09 LAB — CBC WITH DIFFERENTIAL (CANCER CENTER ONLY)
Abs Immature Granulocytes: 0.07 10*3/uL (ref 0.00–0.07)
Basophils Absolute: 0.1 10*3/uL (ref 0.0–0.1)
Basophils Relative: 1 %
Eosinophils Absolute: 0.3 10*3/uL (ref 0.0–0.5)
Eosinophils Relative: 3 %
HCT: 38.2 % (ref 36.0–46.0)
Hemoglobin: 12.6 g/dL (ref 12.0–15.0)
Immature Granulocytes: 1 %
Lymphocytes Relative: 27 %
Lymphs Abs: 2.6 10*3/uL (ref 0.7–4.0)
MCH: 31.7 pg (ref 26.0–34.0)
MCHC: 33 g/dL (ref 30.0–36.0)
MCV: 96.2 fL (ref 80.0–100.0)
Monocytes Absolute: 0.6 10*3/uL (ref 0.1–1.0)
Monocytes Relative: 7 %
Neutro Abs: 5.9 10*3/uL (ref 1.7–7.7)
Neutrophils Relative %: 61 %
Platelet Count: 351 10*3/uL (ref 150–400)
RBC: 3.97 MIL/uL (ref 3.87–5.11)
RDW: 15.5 % (ref 11.5–15.5)
WBC Count: 9.6 10*3/uL (ref 4.0–10.5)
nRBC: 0 % (ref 0.0–0.2)

## 2023-06-09 LAB — CMP (CANCER CENTER ONLY)
ALT: 15 U/L (ref 0–44)
AST: 14 U/L — ABNORMAL LOW (ref 15–41)
Albumin: 4.3 g/dL (ref 3.5–5.0)
Alkaline Phosphatase: 41 U/L (ref 38–126)
Anion gap: 14 (ref 5–15)
BUN: 30 mg/dL — ABNORMAL HIGH (ref 8–23)
CO2: 23 mmol/L (ref 22–32)
Calcium: 9.7 mg/dL (ref 8.9–10.3)
Chloride: 102 mmol/L (ref 98–111)
Creatinine: 2 mg/dL — ABNORMAL HIGH (ref 0.44–1.00)
GFR, Estimated: 25 mL/min — ABNORMAL LOW (ref >60)
Glucose, Bld: 158 mg/dL — ABNORMAL HIGH (ref 70–99)
Potassium: 4.4 mmol/L (ref 3.5–5.1)
Sodium: 139 mmol/L (ref 135–145)
Total Bilirubin: 0.5 mg/dL (ref 0.3–1.2)
Total Protein: 6.8 g/dL (ref 6.5–8.1)

## 2023-06-09 LAB — FERRITIN: Ferritin: 41 ng/mL (ref 11–307)

## 2023-06-09 LAB — IRON AND IRON BINDING CAPACITY (CC-WL,HP ONLY)
Iron: 67 ug/dL (ref 28–170)
Saturation Ratios: 18 % (ref 10.4–31.8)
TIBC: 371 ug/dL (ref 250–450)
UIBC: 304 ug/dL (ref 148–442)

## 2023-06-09 NOTE — Progress Notes (Signed)
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   CML was diagnosed in 2013. She presented with chronic phase and achieved with logical molecular remission.   Current treatment: Tasigna 300 mg bid started 11/2012 with complete hematological and molecular remission.  Therapy has been on hold due to health concerns in April 2022.   HISTORY OF PRESENTING ILLNESS:   Sara Warner is a wonderful 82 y.o. female who has been a previous patient of Dr. Clelia Croft. 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   Patient was last seen by Dr. Clelia Croft on 12/11/2022 and was doing well overall. She noted that her wound was slowly healing. Patient had procedure for peripheral vascular disease scheduled on December 25 2022.   Today, she presents in a wheelchair and is accompanied by her daughter. Patient was on Tasigna between early 2014-2022. Patient does not take Tasigna at this time, but notes that she did tolerate it well previously with no toxicity issues.  Patient was seen by vascular surgery for ulcer in her left heal. She reports that her arteries are healing and continues to be managed by wound care. Patient does endorse DM which is generally well controlled. She denies any fequent cramping in her lower extremities.   She reports that her kidney function fluctuates due to narrowing blood vessel in the kidneys. Patient had a stent tent placed which improved kidney function.  She does function independently at home and uses a walker regularly. She denies any major medication changes in the last 6 months.   She denies any recent infections, new bone pains, abdominal pain/distention, significant unexplained fever, chills, night sweats, sudden change in energy  levels, or leg swelling.   MEDICAL HISTORY:  Past Medical History:  Diagnosis Date   Atrial fibrillation (HCC)    Chronic atrial fibrillation (HCC)    a. Refuses OAC.  CHA2DS2VASc = 4.   Chronic systolic CHF (congestive heart failure) (HCC)    a. 05/2015 Echo: EF 35-40%.   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 06/2013 MV: low risk study w/o ischemia;  b. 05/2015 Echo: EF 35-40%, diff HK, basal-midinferoseptal and basal-midanteroseptal AK. Mild-mod MR, mod dil LA, mild to mod TR, PASP .   PAD (peripheral artery disease) (HCC)    Pneumonia    30 yrs. ago   Renal artery stenosis (HCC)    s/p left renal artery stent 02/18/21    SURGICAL HISTORY: Past Surgical History:  Procedure Laterality Date   ABDOMINAL AORTOGRAM W/LOWER EXTREMITY Bilateral 12/06/2019   Procedure: ABDOMINAL AORTOGRAM W/LOWER EXTREMITY;  Surgeon: Cephus Shelling, MD;  Location: [facility];  Service: Cardiovascular;  Laterality: Bilateral;   ABDOMINAL AORTOGRAM W/LOWER EXTREMITY Bilateral 05/15/2020   Procedure: ABDOMINAL AORTOGRAM W/LOWER EXTREMITY;  Surgeon: Cephus Shelling, MD;  Location: [facility];  Service: Cardiovascular;  Laterality: Bilateral;   ABDOMINAL AORTOGRAM W/LOWER EXTREMITY N/A 02/18/2021   Procedure: ABDOMINAL AORTOGRAM W/LOWER EXTREMITY;  Surgeon: Nada Libman, MD;  Location: [facility];  Service: Cardiovascular;  Laterality: N/A;   ABDOMINAL HYSTERECTOMY     APPLICATION OF WOUND VAC  01/10/2020   BLEEDING FROM FEMORAL BYPASS   BIOPSY  04/08/2020   Procedure: BIOPSY;  Surgeon: Lynann Bologna, MD;  Location: [facility] ENDOSCOPY;  Service: Endoscopy;;   CARDIOVERSION     COLONOSCOPY  WITH PROPOFOL N/A 04/08/2020   Procedure: COLONOSCOPY WITH PROPOFOL;  Surgeon: Lynann Bologna, MD;  Location: [facility] ENDOSCOPY;  Service: Endoscopy;  Laterality: N/A;   CYST EXCISION     removed on right ovary   CYSTECTOMY     ENDARTERECTOMY FEMORAL Left 06/20/2020   Procedure: LEFT COMMON FEMORAL ENDARTERECTOMY LEFT ABOVE KNEE POPLITEAL ENDARTERECTOMY;  Surgeon: Cephus Shelling, MD;  Location: [facility] OR;  Service: Vascular;  Laterality: Left;   ENDARTERECTOMY FEMORAL Left 02/24/2021   Procedure: REDO LEFT COMMON FEMORAL ENDARTERECTOMY AND PROFUNDAPLASTY WTH BOVINE PATCH;  Surgeon: Cephus Shelling, MD;  Location: [facility] OR;  Service: Vascular;  Laterality: Left;   ENTEROSCOPY N/A 03/13/2021   Procedure: ENTEROSCOPY;  Surgeon: Beverley Fiedler, MD;  Location: [facility] ENDOSCOPY;  Service: Gastroenterology;  Laterality: N/A;   ESOPHAGOGASTRODUODENOSCOPY (EGD) WITH PROPOFOL N/A 04/08/2020   Procedure: ESOPHAGOGASTRODUODENOSCOPY (EGD) WITH PROPOFOL;  Surgeon: Lynann Bologna, MD;  Location: [facility] ENDOSCOPY;  Service: Endoscopy;  Laterality: N/A;   ESOPHAGOGASTRODUODENOSCOPY (EGD) WITH PROPOFOL N/A 03/11/2021   Procedure: ESOPHAGOGASTRODUODENOSCOPY (EGD) WITH PROPOFOL;  Surgeon: Beverley Fiedler, MD;  Location: [facility] ENDOSCOPY;  Service: Gastroenterology;  Laterality: N/A;   FEMORAL ARTERY - POPLITEAL ARTERY BYPASS GRAFT  12/15/2019   Right common femoral artery to below-knee popliteal artery bypass with ipsilateral nonreversed great saphenous vein   FEMORAL-POPLITEAL BYPASS GRAFT Right 12/15/2019   Procedure: BYPASS GRAFT FEMORAL-POPLITEAL ARTERY;  Surgeon: Cephus Shelling, MD;  Location: [facility] OR;  Service: Vascular;  Laterality: Right;   FEMORAL-POPLITEAL BYPASS GRAFT Left 06/20/2020   Procedure: BYPASS GRAFT FEMORAL TO ABOVE KNEE POPLITEAL ARTERY WITH HARVENSTED NON REVERSED GREATER SAPHENOUS VEIN;  Surgeon: Cephus Shelling, MD;  Location: [facility] OR;  Service: Vascular;  Laterality: Left;   FEMORAL-POPLITEAL BYPASS GRAFT  Left 02/24/2021   Procedure: REDO LEFT COMMON FEMORAL ARTERY EXPOSURE AND LEFT COMMON FEMORAL ARTERY TO BELOW KNEE POPLITEAL ARTERY BYPASS GRAFT WITH PROPATEN VASCULAR GRAFT;  Surgeon: Cephus Shelling, MD;  Location: [facility] OR;  Service: Vascular;  Laterality: Left;   GIVENS CAPSULE STUDY N/A 03/11/2021   Procedure: GIVENS CAPSULE STUDY;  Surgeon: Beverley Fiedler, MD;  Location: [facility] ENDOSCOPY;  Service: Gastroenterology;  Laterality: N/A;   GIVENS CAPSULE STUDY N/A 03/13/2021   Procedure: GIVENS CAPSULE STUDY;  Surgeon: Beverley Fiedler, MD;  Location: [facility] ENDOSCOPY;  Service: Gastroenterology;  Laterality: N/A;   HOT HEMOSTASIS N/A 03/11/2021   Procedure: HOT HEMOSTASIS (ARGON PLASMA COAGULATION/BICAP);  Surgeon: Beverley Fiedler, MD;  Location: [facility] ENDOSCOPY;  Service: Gastroenterology;  Laterality: N/A;   HYSTEROTOMY     PERIPHERAL VASCULAR INTERVENTION Right 12/06/2019   Procedure: PERIPHERAL VASCULAR INTERVENTION;  Surgeon: Cephus Shelling, MD;  Location: [facility];  Service: Cardiovascular;  Laterality: Right;  EXT ILIAC   PERIPHERAL VASCULAR INTERVENTION Left 02/18/2021   Procedure: PERIPHERAL VASCULAR INTERVENTION;  Surgeon: Nada Libman, MD;  Location: [facility];  Service: Cardiovascular;  Laterality: Left;  renal artery   POLYPECTOMY  04/08/2020   Procedure: POLYPECTOMY;  Surgeon: Lynann Bologna, MD;  Location: [facility] ENDOSCOPY;  Service: Endoscopy;;   WOUND EXPLORATION Right 01/10/2020  Procedure: right lower leg Wound Exploration with wound vac application;  Surgeon: Nada Libman, MD;  Location: [facility] OR;  Service: Vascular;  Laterality: Right;    SOCIAL HISTORY: Social History   Socioeconomic History   Marital status: Single    Spouse name: Not on file   Number of children: Not on file   Years of education: Not on file   Highest education level: Not on file  Occupational History   Not on file  Tobacco Use   Smoking status: Former    Types: Cigarettes    Quit date:  01/01/1984    Years since quitting: 39.4   Smokeless tobacco: Never  Vaping Use   Vaping Use: Never used  Substance and Sexual Activity   Alcohol use: No   Drug use: Never   Sexual activity: Not on file  Other Topics Concern   Not on file  Social History Narrative   Not on file   Social Determinants of Health   Financial Resource Strain: Not on file  Food Insecurity: Not on file  Transportation Needs: Not on file  Physical Activity: Not on file  Stress: Not on file  Social Connections: Not on file  Intimate Partner Violence: Not on file    FAMILY HISTORY: Family History  Problem Relation Age of Onset   Cancer - Colon Mother        mast to lungs   Diabetes Father    Diabetes Paternal Grandmother     ALLERGIES:  is allergic to lisinopril and losartan.  MEDICATIONS:  Current Outpatient Medications  Medication Sig Dispense Refill   amoxicillin-clavulanate (AUGMENTIN) 875-125 MG tablet Take 1 tablet by mouth 2 (two) times daily. 14 tablet 0   cetirizine (ZYRTEC) 10 MG tablet Take 10 mg by mouth daily.     clorazepate (TRANXENE-T) 7.5 MG tablet Take 1 tablet (7.5 mg total) by mouth 2 (two) times daily as needed for anxiety. 30 tablet 1   colchicine 0.6 MG tablet Take 0.6 mg by mouth 2 (two) times daily.     diclofenac Sodium (VOLTAREN) 1 % GEL SMARTSIG:1 Gram(s) Topical 4 Times Daily PRN     digoxin (LANOXIN) 0.125 MG tablet TAKE 0.5 TABLET (0.0625 MG TOTAL) BY MOUTH EVERY OTHER DAY. 45 tablet 5   Dulaglutide 3 MG/0.5ML SOPN Inject 3 mg into the skin once a week.     glipiZIDE (GLUCOTROL XL) 10 MG 24 hr tablet Take 10 mg by mouth 2 (two) times daily.      iron polysaccharides (NIFEREX) 150 MG capsule Take 150 mg by mouth daily.     metoprolol (TOPROL-XL) 200 MG 24 hr tablet TAKE 1 TABLET BY MOUTH EVERY DAY 90 tablet 3   nilotinib (TASIGNA) 150 MG capsule Take 2 capsules (300 mg total) by mouth every 12 (twelve) hours. Take on an empty stomach, 1 hour before or 2 hours after  meals. 120 capsule 0   oxyCODONE-acetaminophen (PERCOCET/ROXICET) 5-325 MG tablet Take 1 tablet by mouth every 4 (four) hours as needed for severe pain. 30 tablet 0   pantoprazole (PROTONIX) 40 MG tablet Take 40 mg by mouth daily.     rosuvastatin (CRESTOR) 10 MG tablet Take 1 tablet (10 mg total) by mouth daily. 90 tablet 3   sitaGLIPtin (JANUVIA) 50 MG tablet Take 50 mg by mouth daily.     sucralfate (CARAFATE) 1 g tablet Take 1 g by mouth 4 (four) times daily.     SYNTHROID 100 MCG tablet Take 100 mcg  by mouth daily.     torsemide (DEMADEX) 20 MG tablet Take 20 mg by mouth daily. Take 1 Tablet Daily.     No current facility-administered medications for this visit.    REVIEW OF SYSTEMS:    10 Point review of Systems was done is negative except as noted above.  PHYSICAL EXAMINATION: ECOG PERFORMANCE STATUS: 2 - Symptomatic, <50% confined to bed  . Vitals:   06/09/23 1418  BP: (!) 111/56  Pulse: 70  Resp: 18  Temp: (!) 97.5 F (36.4 C)  SpO2: 99%   Filed Weights   06/09/23 1418  Weight: 185 lb 8 oz (84.1 kg)   .Body mass index is 28.21 kg/m.  GENERAL:alert, in no acute distress and comfortable SKIN: no acute rashes, no significant lesions EYES: conjunctiva are pink and non-injected, sclera anicteric OROPHARYNX: MMM, no exudates, no oropharyngeal erythema or ulceration NECK: supple, no JVD LYMPH:  no palpable lymphadenopathy in the cervical, axillary or inguinal regions LUNGS: clear to auscultation b/l with normal respiratory effort HEART: regular rate & rhythm ABDOMEN:  normoactive bowel sounds , non tender, not distended. Extremity: no pedal edema PSYCH: alert & oriented x 3 with fluent speech NEURO: no focal motor/sensory deficits  LABORATORY DATA:  I have reviewed the data as listed  .    Latest Ref Rng & Units 06/09/2023    2:01 PM 12/11/2022   12:56 PM 06/17/2022    2:28 PM  CBC  WBC 4.0 - 10.5 K/uL 9.6  9.3  11.9   Hemoglobin 12.0 - 15.0 g/dL 16.1  09.6   04.5   Hematocrit 36.0 - 46.0 % 38.2  39.9  38.7   Platelets 150 - 400 K/uL 351  373  379     .    Latest Ref Rng & Units 06/09/2023    2:01 PM 12/11/2022   12:56 PM 06/17/2022    2:29 PM  CMP  Glucose 70 - 99 mg/dL 409  811  914   BUN 8 - 23 mg/dL 30  28  27    Creatinine 0.44 - 1.00 mg/dL 7.82  9.56  2.13   Sodium 135 - 145 mmol/L 139  138  136   Potassium 3.5 - 5.1 mmol/L 4.4  4.5  4.4   Chloride 98 - 111 mmol/L 102  106  100   CO2 22 - 32 mmol/L 23  26  26    Calcium 8.9 - 10.3 mg/dL 9.7  9.9  9.5   Total Protein 6.5 - 8.1 g/dL 6.8  7.0  7.0   Total Bilirubin 0.3 - 1.2 mg/dL 0.5  0.6  0.2   Alkaline Phos 38 - 126 U/L 41  43  57   AST 15 - 41 U/L 14  17  23    ALT 0 - 44 U/L 15  20  26       RADIOGRAPHIC STUDIES: I have personally reviewed the radiological images as listed and agreed with the findings in the report. No results found.  ASSESSMENT & PLAN:  82 y.o. female with:  1.  Chronic phase CML diagnosed in 2013.  She achieved molecular and hematological remission but off treatment due to vascular disease and health issues.   2. Iron deficiency anemia  3. Peripheral vascular disease, continues to follow with vascular surgery.   PLAN: -patient transferred from Dr Clelia Croft. EMR reviewed and all relevant information confirmed with patient. -Discussed lab results from 06/09/23 in detail with pateint. CBC normal, showed WBC of 9.6K, hemoglobin of  12.6, and platelets of 351K. -CMP shows CKD, creatine improved from 1.14 to 2.00 -last BCR-ABL two years ago showed undetectable results -discussed that the highest risk of relapse while off Tasigna is generally during the first 6 months -educated patient on levels of remission including hematologic remission, cytogenetic remission, and major molecular remission -discussed goal to achieve major molecular remission with CML -will order Molecular studies with next visit -will order labs with phone visit in 4 months -continue to  follow with nephrology and vascular surgery regularly for optimal care  FOLLOW-UP: Labs in 14 weeks phone visit with Dr. Candise Che in 16 weeks  The total time spent in the appointment was 40 minutes* .  All of the patient's questions were answered with apparent satisfaction. The patient knows to call the clinic with any problems, questions or concerns.   Wyvonnia Lora MD MS AAHIVMS [facility] Hematology/Oncology Physician [facility]  .*Total Encounter Time as defined by the Centers for Medicare and Medicaid Services includes, in addition to the face-to-face time of a patient visit (documented in the note above) non-face-to-face time: obtaining and reviewing outside history, ordering and reviewing medications, tests or procedures, care coordination (communications with other health care professionals or caregivers) and documentation in the medical record.    I,Mitra Faeizi,acting as a Neurosurgeon for Wyvonnia Lora, MD.,have documented all relevant documentation on the behalf of Wyvonnia Lora, MD,as directed by  Wyvonnia Lora, MD while in the presence of Wyvonnia Lora, MD.  .I have reviewed the above documentation for accuracy and completeness, and I agree with the above. Johney Maine MD

## 2023-06-11 ENCOUNTER — Telehealth: Payer: Self-pay | Admitting: Hematology

## 2023-06-11 NOTE — Telephone Encounter (Signed)
Unable to leave a message regarding appointment times /dates

## 2023-06-16 ENCOUNTER — Encounter: Payer: Self-pay | Admitting: Hematology

## 2023-06-16 NOTE — Progress Notes (Incomplete)
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   CML was diagnosed in 2013. She presented with chronic phase and achieved with logical molecular remission.   Current treatment: Tasigna 300 mg bid started 11/2012 with complete hematological and molecular remission.  Therapy has been on hold due to health concerns in April 2022.   HISTORY OF PRESENTING ILLNESS:   Sara Warner is a wonderful 82 y.o. female who has been a previous patient of Dr. Clelia Croft. 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   Patient was last seen by Dr. Clelia Croft on 12/11/2022 and was doing well overall. She noted that her wound was slowly healing. Patient had procedure for peripheral vascular disease scheduled on December 25 2022.   Today, she presents in a wheelchair and is accompanied by her daughter. Patient was on Tasigna between early 2014-2022. Patient does not take Tasigna at this time, but notes that she did tolerate it well previously with no toxicity issues.  Patient was seen by vascular surgery for ulcer in her left heal. She reports that her arteries are healing and continues to be managed by wound care. Patient does endorse DM which is generally well controlled. She denies any fequent cramping in her lower extremities.   She reports that her kidney function fluctuates due to narrowing blood vessel in the kidneys. Patient had a stent tent placed which improved kidney function.  She does function independently at home and uses a walker regularly. She denies any major medication changes in the last 6 months.   She denies any recent infections, new bone pains, abdominal pain/distention, significant unexplained fever, chills, night sweats, sudden change in energy  levels, or leg swelling.   MEDICAL HISTORY:  Past Medical History:  Diagnosis Date  . Atrial fibrillation (HCC)   . Chronic atrial fibrillation (HCC)    a. Refuses OAC.  CHA2DS2VASc = 4.  . Chronic systolic CHF (congestive heart failure) (HCC)    a. 05/2015 Echo: EF 35-40%.  . CKD (chronic kidney disease), stage III (HCC)   . 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 DM (diabetes mellitus) (HCC)   . Dysrhythmia   . Elevated WBC count   . Epistaxis 04/03/2020   AFTER COVID SWAB  . Hypertension   . Hypertensive heart disease   . Hypothyroidism   . NICM (nonischemic cardiomyopathy) (HCC)    a. 06/2013 MV: low risk study w/o ischemia;  b. 05/2015 Echo: EF 35-40%, diff HK, basal-midinferoseptal and basal-midanteroseptal AK. Mild-mod MR, mod dil LA, mild to mod TR, PASP .  Marland Kitchen PAD (peripheral artery disease) (HCC)   . Pneumonia    30 yrs. ago  . Renal artery stenosis (HCC)    s/p left renal artery stent 02/18/21    SURGICAL HISTORY: Past Surgical History:  Procedure Laterality Date  . ABDOMINAL AORTOGRAM W/LOWER EXTREMITY Bilateral 12/06/2019   Procedure: ABDOMINAL AORTOGRAM W/LOWER EXTREMITY;  Surgeon: Cephus Shelling, MD;  Location: [facility] INVASIVE CV LAB;  Service: Cardiovascular;  Laterality: Bilateral;  . ABDOMINAL AORTOGRAM W/LOWER EXTREMITY Bilateral 05/15/2020   Procedure: ABDOMINAL AORTOGRAM W/LOWER EXTREMITY;  Surgeon: Cephus Shelling, MD;  Location: [facility] INVASIVE CV LAB;  Service: Cardiovascular;  Laterality: Bilateral;  . ABDOMINAL AORTOGRAM W/LOWER EXTREMITY N/A 02/18/2021   Procedure: ABDOMINAL AORTOGRAM W/LOWER EXTREMITY;  Surgeon: Nada Libman, MD;  Location: [facility] INVASIVE CV LAB;  Service: Cardiovascular;  Laterality: N/A;  . ABDOMINAL HYSTERECTOMY    . APPLICATION OF WOUND VAC  01/10/2020   BLEEDING FROM FEMORAL BYPASS  . BIOPSY  04/08/2020   Procedure: BIOPSY;  Surgeon: Lynann Bologna, MD;  Location: [facility] ENDOSCOPY;  Service: Endoscopy;;  .  CARDIOVERSION    . COLONOSCOPY WITH PROPOFOL N/A 04/08/2020   Procedure: COLONOSCOPY WITH PROPOFOL;  Surgeon: Lynann Bologna, MD;  Location: [facility] ENDOSCOPY;  Service: Endoscopy;  Laterality: N/A;  . CYST EXCISION     removed on right ovary  . CYSTECTOMY    . ENDARTERECTOMY FEMORAL Left 06/20/2020   Procedure: LEFT COMMON FEMORAL ENDARTERECTOMY LEFT ABOVE KNEE POPLITEAL ENDARTERECTOMY;  Surgeon: Cephus Shelling, MD;  Location: [facility] OR;  Service: Vascular;  Laterality: Left;  . ENDARTERECTOMY FEMORAL Left 02/24/2021   Procedure: REDO LEFT COMMON FEMORAL ENDARTERECTOMY AND PROFUNDAPLASTY WTH BOVINE PATCH;  Surgeon: Cephus Shelling, MD;  Location: [facility] OR;  Service: Vascular;  Laterality: Left;  . ENTEROSCOPY N/A 03/13/2021   Procedure: ENTEROSCOPY;  Surgeon: Beverley Fiedler, MD;  Location: [facility] ENDOSCOPY;  Service: Gastroenterology;  Laterality: N/A;  . ESOPHAGOGASTRODUODENOSCOPY (EGD) WITH PROPOFOL N/A 04/08/2020   Procedure: ESOPHAGOGASTRODUODENOSCOPY (EGD) WITH PROPOFOL;  Surgeon: Lynann Bologna, MD;  Location: [facility] ENDOSCOPY;  Service: Endoscopy;  Laterality: N/A;  . ESOPHAGOGASTRODUODENOSCOPY (EGD) WITH PROPOFOL N/A 03/11/2021   Procedure: ESOPHAGOGASTRODUODENOSCOPY (EGD) WITH PROPOFOL;  Surgeon: Beverley Fiedler, MD;  Location: [facility] ENDOSCOPY;  Service: Gastroenterology;  Laterality: N/A;  . FEMORAL ARTERY - POPLITEAL ARTERY BYPASS GRAFT  12/15/2019   Right common femoral artery to below-knee popliteal artery bypass with ipsilateral nonreversed great saphenous vein  . FEMORAL-POPLITEAL BYPASS GRAFT Right 12/15/2019   Procedure: BYPASS GRAFT FEMORAL-POPLITEAL ARTERY;  Surgeon: Cephus Shelling, MD;  Location: [facility] OR;  Service: Vascular;  Laterality: Right;  . FEMORAL-POPLITEAL BYPASS GRAFT Left 06/20/2020   Procedure: BYPASS GRAFT FEMORAL TO ABOVE KNEE POPLITEAL ARTERY WITH HARVENSTED NON REVERSED GREATER SAPHENOUS VEIN;  Surgeon: Cephus Shelling, MD;  Location: [facility] OR;  Service: Vascular;  Laterality:  Left;  . FEMORAL-POPLITEAL BYPASS GRAFT Left 02/24/2021   Procedure: REDO LEFT COMMON FEMORAL ARTERY EXPOSURE AND LEFT COMMON FEMORAL ARTERY TO BELOW KNEE POPLITEAL ARTERY BYPASS GRAFT WITH PROPATEN VASCULAR GRAFT;  Surgeon: Cephus Shelling, MD;  Location: [facility] OR;  Service: Vascular;  Laterality: Left;  . GIVENS CAPSULE STUDY N/A 03/11/2021   Procedure: GIVENS CAPSULE STUDY;  Surgeon: Beverley Fiedler, MD;  Location: [facility] ENDOSCOPY;  Service: Gastroenterology;  Laterality: N/A;  . GIVENS CAPSULE STUDY N/A 03/13/2021   Procedure: GIVENS CAPSULE STUDY;  Surgeon: Beverley Fiedler, MD;  Location: [facility] ENDOSCOPY;  Service: Gastroenterology;  Laterality: N/A;  . HOT HEMOSTASIS N/A 03/11/2021   Procedure: HOT HEMOSTASIS (ARGON PLASMA COAGULATION/BICAP);  Surgeon: Beverley Fiedler, MD;  Location: [facility] ENDOSCOPY;  Service: Gastroenterology;  Laterality: N/A;  . HYSTEROTOMY    . PERIPHERAL VASCULAR INTERVENTION Right 12/06/2019   Procedure: PERIPHERAL VASCULAR INTERVENTION;  Surgeon: Cephus Shelling, MD;  Location: [facility] INVASIVE CV LAB;  Service: Cardiovascular;  Laterality: Right;  EXT ILIAC  . PERIPHERAL VASCULAR INTERVENTION Left 02/18/2021   Procedure: PERIPHERAL VASCULAR INTERVENTION;  Surgeon: Nada Libman, MD;  Location: [facility] INVASIVE CV LAB;  Service: Cardiovascular;  Laterality: Left;  renal artery  . POLYPECTOMY  04/08/2020   Procedure: POLYPECTOMY;  Surgeon: Lynann Bologna, MD;  Location: [facility] ENDOSCOPY;  Service: Endoscopy;;  . WOUND EXPLORATION Right 01/10/2020  Procedure: right lower leg Wound Exploration with wound vac application;  Surgeon: Nada Libman, MD;  Location: [facility] OR;  Service: Vascular;  Laterality: Right;    SOCIAL HISTORY: Social History   Socioeconomic History  . Marital status: Single    Spouse name: Not on file  . Number of children: Not on file  . Years of education: Not on file  . Highest education level: Not on file  Occupational History  . Not on file  Tobacco Use  .  Smoking status: Former    Types: Cigarettes    Quit date: 01/01/1984    Years since quitting: 39.4  . Smokeless tobacco: Never  Vaping Use  . Vaping Use: Never used  Substance and Sexual Activity  . Alcohol use: No  . Drug use: Never  . Sexual activity: Not on file  Other Topics Concern  . Not on file  Social History Narrative  . Not on file   Social Determinants of Health   Financial Resource Strain: Not on file  Food Insecurity: Not on file  Transportation Needs: Not on file  Physical Activity: Not on file  Stress: Not on file  Social Connections: Not on file  Intimate Partner Violence: Not on file    FAMILY HISTORY: Family History  Problem Relation Age of Onset  . Cancer - Colon Mother        mast to lungs  . Diabetes Father   . Diabetes Paternal Grandmother     ALLERGIES:  is allergic to lisinopril and losartan.  MEDICATIONS:  Current Outpatient Medications  Medication Sig Dispense Refill  . amoxicillin-clavulanate (AUGMENTIN) 875-125 MG tablet Take 1 tablet by mouth 2 (two) times daily. 14 tablet 0  . cetirizine (ZYRTEC) 10 MG tablet Take 10 mg by mouth daily.    . clorazepate (TRANXENE-T) 7.5 MG tablet Take 1 tablet (7.5 mg total) by mouth 2 (two) times daily as needed for anxiety. 30 tablet 1  . colchicine 0.6 MG tablet Take 0.6 mg by mouth 2 (two) times daily.    . diclofenac Sodium (VOLTAREN) 1 % GEL SMARTSIG:1 Gram(s) Topical 4 Times Daily PRN    . digoxin (LANOXIN) 0.125 MG tablet TAKE 0.5 TABLET (0.0625 MG TOTAL) BY MOUTH EVERY OTHER DAY. 45 tablet 5  . Dulaglutide 3 MG/0.5ML SOPN Inject 3 mg into the skin once a week.    Marland Kitchen glipiZIDE (GLUCOTROL XL) 10 MG 24 hr tablet Take 10 mg by mouth 2 (two) times daily.     . iron polysaccharides (NIFEREX) 150 MG capsule Take 150 mg by mouth daily.    . metoprolol (TOPROL-XL) 200 MG 24 hr tablet TAKE 1 TABLET BY MOUTH EVERY DAY 90 tablet 3  . nilotinib (TASIGNA) 150 MG capsule Take 2 capsules (300 mg total) by mouth  every 12 (twelve) hours. Take on an empty stomach, 1 hour before or 2 hours after meals. 120 capsule 0  . oxyCODONE-acetaminophen (PERCOCET/ROXICET) 5-325 MG tablet Take 1 tablet by mouth every 4 (four) hours as needed for severe pain. 30 tablet 0  . pantoprazole (PROTONIX) 40 MG tablet Take 40 mg by mouth daily.    . rosuvastatin (CRESTOR) 10 MG tablet Take 1 tablet (10 mg total) by mouth daily. 90 tablet 3  . sitaGLIPtin (JANUVIA) 50 MG tablet Take 50 mg by mouth daily.    . sucralfate (CARAFATE) 1 g tablet Take 1 g by mouth 4 (four) times daily.    Marland Kitchen SYNTHROID 100 MCG tablet Take 100 mcg  by mouth daily.    Marland Kitchen torsemide (DEMADEX) 20 MG tablet Take 20 mg by mouth daily. Take 1 Tablet Daily.     No current facility-administered medications for this visit.    REVIEW OF SYSTEMS:    10 Point review of Systems was done is negative except as noted above.  PHYSICAL EXAMINATION: ECOG PERFORMANCE STATUS: 2 - Symptomatic, <50% confined to bed  . Vitals:   06/09/23 1418  BP: (!) 111/56  Pulse: 70  Resp: 18  Temp: (!) 97.5 F (36.4 C)  SpO2: 99%   Filed Weights   06/09/23 1418  Weight: 185 lb 8 oz (84.1 kg)   .Body mass index is 28.21 kg/m.  GENERAL:alert, in no acute distress and comfortable SKIN: no acute rashes, no significant lesions EYES: conjunctiva are pink and non-injected, sclera anicteric OROPHARYNX: MMM, no exudates, no oropharyngeal erythema or ulceration NECK: supple, no JVD LYMPH:  no palpable lymphadenopathy in the cervical, axillary or inguinal regions LUNGS: clear to auscultation b/l with normal respiratory effort HEART: regular rate & rhythm ABDOMEN:  normoactive bowel sounds , non tender, not distended. Extremity: no pedal edema PSYCH: alert & oriented x 3 with fluent speech NEURO: no focal motor/sensory deficits  LABORATORY DATA:  I have reviewed the data as listed  .    Latest Ref Rng & Units 06/09/2023    2:01 PM 12/11/2022   12:56 PM 06/17/2022    2:28  PM  CBC  WBC 4.0 - 10.5 K/uL 9.6  9.3  11.9   Hemoglobin 12.0 - 15.0 g/dL 46.9  62.9  52.8   Hematocrit 36.0 - 46.0 % 38.2  39.9  38.7   Platelets 150 - 400 K/uL 351  373  379     .    Latest Ref Rng & Units 06/09/2023    2:01 PM 12/11/2022   12:56 PM 06/17/2022    2:29 PM  CMP  Glucose 70 - 99 mg/dL 413  244  010   BUN 8 - 23 mg/dL 30  28  27    Creatinine 0.44 - 1.00 mg/dL 2.72  5.36  6.44   Sodium 135 - 145 mmol/L 139  138  136   Potassium 3.5 - 5.1 mmol/L 4.4  4.5  4.4   Chloride 98 - 111 mmol/L 102  106  100   CO2 22 - 32 mmol/L 23  26  26    Calcium 8.9 - 10.3 mg/dL 9.7  9.9  9.5   Total Protein 6.5 - 8.1 g/dL 6.8  7.0  7.0   Total Bilirubin 0.3 - 1.2 mg/dL 0.5  0.6  0.2   Alkaline Phos 38 - 126 U/L 41  43  57   AST 15 - 41 U/L 14  17  23    ALT 0 - 44 U/L 15  20  26       RADIOGRAPHIC STUDIES: I have personally reviewed the radiological images as listed and agreed with the findings in the report. No results found.  ASSESSMENT & PLAN:  82 y.o. female with:  1.  Chronic phase CML diagnosed in 2013.  She achieved molecular and hematological remission but off treatment due to vascular disease and health issues.   2. Iron deficiency anemia  3. Peripheral vascular disease, continues to follow with vascular surgery.   PLAN:  -Discussed lab results from 06/09/23 in detail with pateint. CBC normal, showed WBC of 9.6K, hemoglobin of 12.6, and platelets of 351K. -CMP shows CKD, creatine improved from 1.14 to  2.00 -last BCR-ABL two years ago showed undetectable results -discussed that the highest risk of relapse while off Tasigna is generally during the first 6 months -educated patient on levels of remission including hematologic remission, cytogenetic remission, and major molecular remission -discussed goal to achieve major molecular remission with CML -will order Molecular studies with next visit -will order labs with phone visit in 4 months -continue to follow with  nephrology and vascular surgery regularly for optimal care  FOLLOW-UP: Labs in 14 weeks phone visit with Dr. Candise Che in 16 weeks  The total time spent in the appointment was 40 minutes* .  All of the patient's questions were answered with apparent satisfaction. The patient knows to call the clinic with any problems, questions or concerns.   Wyvonnia Lora MD MS AAHIVMS [facility] Hematology/Oncology Physician [facility]  .*Total Encounter Time as defined by the Centers for Medicare and Medicaid Services includes, in addition to the face-to-face time of a patient visit (documented in the note above) non-face-to-face time: obtaining and reviewing outside history, ordering and reviewing medications, tests or procedures, care coordination (communications with other health care professionals or caregivers) and documentation in the medical record.    I,Mitra Faeizi,acting as a Neurosurgeon for Wyvonnia Lora, MD.,have documented all relevant documentation on the behalf of Wyvonnia Lora, MD,as directed by  Wyvonnia Lora, MD while in the presence of Wyvonnia Lora, MD.  ***

## 2023-06-22 DIAGNOSIS — L97522 Non-pressure chronic ulcer of other part of left foot with fat layer exposed: Secondary | ICD-10-CM | POA: Diagnosis not present

## 2023-06-24 DIAGNOSIS — N183 Chronic kidney disease, stage 3 unspecified: Secondary | ICD-10-CM | POA: Diagnosis not present

## 2023-06-29 DIAGNOSIS — S91302A Unspecified open wound, left foot, initial encounter: Secondary | ICD-10-CM | POA: Diagnosis not present

## 2023-06-29 DIAGNOSIS — I739 Peripheral vascular disease, unspecified: Secondary | ICD-10-CM | POA: Diagnosis not present

## 2023-07-01 DIAGNOSIS — L89622 Pressure ulcer of left heel, stage 2: Secondary | ICD-10-CM | POA: Diagnosis not present

## 2023-07-01 DIAGNOSIS — I480 Paroxysmal atrial fibrillation: Secondary | ICD-10-CM | POA: Diagnosis not present

## 2023-07-01 DIAGNOSIS — Z7901 Long term (current) use of anticoagulants: Secondary | ICD-10-CM | POA: Diagnosis not present

## 2023-07-01 DIAGNOSIS — M10072 Idiopathic gout, left ankle and foot: Secondary | ICD-10-CM | POA: Diagnosis not present

## 2023-07-13 DIAGNOSIS — S91339A Puncture wound without foreign body, unspecified foot, initial encounter: Secondary | ICD-10-CM | POA: Diagnosis not present

## 2023-07-13 DIAGNOSIS — I739 Peripheral vascular disease, unspecified: Secondary | ICD-10-CM | POA: Diagnosis not present

## 2023-07-13 DIAGNOSIS — E1151 Type 2 diabetes mellitus with diabetic peripheral angiopathy without gangrene: Secondary | ICD-10-CM | POA: Diagnosis not present

## 2023-07-13 DIAGNOSIS — L97421 Non-pressure chronic ulcer of left heel and midfoot limited to breakdown of skin: Secondary | ICD-10-CM | POA: Diagnosis not present

## 2023-07-13 DIAGNOSIS — W450XXA Nail entering through skin, initial encounter: Secondary | ICD-10-CM | POA: Diagnosis not present

## 2023-07-13 DIAGNOSIS — E11621 Type 2 diabetes mellitus with foot ulcer: Secondary | ICD-10-CM | POA: Diagnosis not present

## 2023-07-23 ENCOUNTER — Other Ambulatory Visit: Payer: Self-pay | Admitting: Adult Health

## 2023-08-03 DIAGNOSIS — L97522 Non-pressure chronic ulcer of other part of left foot with fat layer exposed: Secondary | ICD-10-CM | POA: Diagnosis not present

## 2023-08-03 DIAGNOSIS — E1151 Type 2 diabetes mellitus with diabetic peripheral angiopathy without gangrene: Secondary | ICD-10-CM | POA: Diagnosis not present

## 2023-08-03 DIAGNOSIS — I739 Peripheral vascular disease, unspecified: Secondary | ICD-10-CM | POA: Diagnosis not present

## 2023-08-03 DIAGNOSIS — Z09 Encounter for follow-up examination after completed treatment for conditions other than malignant neoplasm: Secondary | ICD-10-CM | POA: Diagnosis not present

## 2023-08-25 ENCOUNTER — Other Ambulatory Visit: Payer: Self-pay | Admitting: Adult Health

## 2023-08-26 DIAGNOSIS — H6123 Impacted cerumen, bilateral: Secondary | ICD-10-CM | POA: Diagnosis not present

## 2023-09-07 DIAGNOSIS — Z6827 Body mass index (BMI) 27.0-27.9, adult: Secondary | ICD-10-CM | POA: Diagnosis not present

## 2023-09-07 DIAGNOSIS — L89629 Pressure ulcer of left heel, unspecified stage: Secondary | ICD-10-CM | POA: Diagnosis not present

## 2023-09-07 DIAGNOSIS — E1151 Type 2 diabetes mellitus with diabetic peripheral angiopathy without gangrene: Secondary | ICD-10-CM | POA: Diagnosis not present

## 2023-09-07 DIAGNOSIS — C9210 Chronic myeloid leukemia, BCR/ABL-positive, not having achieved remission: Secondary | ICD-10-CM | POA: Diagnosis not present

## 2023-09-13 ENCOUNTER — Other Ambulatory Visit: Payer: Self-pay

## 2023-09-13 DIAGNOSIS — C9210 Chronic myeloid leukemia, BCR/ABL-positive, not having achieved remission: Secondary | ICD-10-CM

## 2023-09-14 ENCOUNTER — Inpatient Hospital Stay: Payer: PPO | Attending: Hematology

## 2023-09-14 DIAGNOSIS — N183 Chronic kidney disease, stage 3 unspecified: Secondary | ICD-10-CM | POA: Diagnosis not present

## 2023-09-14 DIAGNOSIS — C9211 Chronic myeloid leukemia, BCR/ABL-positive, in remission: Secondary | ICD-10-CM | POA: Insufficient documentation

## 2023-09-14 DIAGNOSIS — Z87891 Personal history of nicotine dependence: Secondary | ICD-10-CM | POA: Insufficient documentation

## 2023-09-14 DIAGNOSIS — D509 Iron deficiency anemia, unspecified: Secondary | ICD-10-CM | POA: Insufficient documentation

## 2023-09-14 DIAGNOSIS — E1122 Type 2 diabetes mellitus with diabetic chronic kidney disease: Secondary | ICD-10-CM | POA: Diagnosis not present

## 2023-09-14 DIAGNOSIS — Z800 Family history of malignant neoplasm of digestive organs: Secondary | ICD-10-CM | POA: Insufficient documentation

## 2023-09-14 DIAGNOSIS — E1151 Type 2 diabetes mellitus with diabetic peripheral angiopathy without gangrene: Secondary | ICD-10-CM | POA: Diagnosis not present

## 2023-09-14 DIAGNOSIS — C9210 Chronic myeloid leukemia, BCR/ABL-positive, not having achieved remission: Secondary | ICD-10-CM

## 2023-09-14 LAB — CBC WITH DIFFERENTIAL (CANCER CENTER ONLY)
Abs Immature Granulocytes: 0.08 10*3/uL — ABNORMAL HIGH (ref 0.00–0.07)
Basophils Absolute: 0.1 10*3/uL (ref 0.0–0.1)
Basophils Relative: 1 %
Eosinophils Absolute: 0.6 10*3/uL — ABNORMAL HIGH (ref 0.0–0.5)
Eosinophils Relative: 6 %
HCT: 37.5 % (ref 36.0–46.0)
Hemoglobin: 12.2 g/dL (ref 12.0–15.0)
Immature Granulocytes: 1 %
Lymphocytes Relative: 27 %
Lymphs Abs: 2.6 10*3/uL (ref 0.7–4.0)
MCH: 32.2 pg (ref 26.0–34.0)
MCHC: 32.5 g/dL (ref 30.0–36.0)
MCV: 98.9 fL (ref 80.0–100.0)
Monocytes Absolute: 0.6 10*3/uL (ref 0.1–1.0)
Monocytes Relative: 7 %
Neutro Abs: 5.5 10*3/uL (ref 1.7–7.7)
Neutrophils Relative %: 58 %
Platelet Count: 349 10*3/uL (ref 150–400)
RBC: 3.79 MIL/uL — ABNORMAL LOW (ref 3.87–5.11)
RDW: 15.5 % (ref 11.5–15.5)
WBC Count: 9.5 10*3/uL (ref 4.0–10.5)
nRBC: 0.2 % (ref 0.0–0.2)

## 2023-09-14 LAB — CMP (CANCER CENTER ONLY)
ALT: 33 U/L (ref 0–44)
AST: 24 U/L (ref 15–41)
Albumin: 4.5 g/dL (ref 3.5–5.0)
Alkaline Phosphatase: 44 U/L (ref 38–126)
Anion gap: 9 (ref 5–15)
BUN: 29 mg/dL — ABNORMAL HIGH (ref 8–23)
CO2: 27 mmol/L (ref 22–32)
Calcium: 9.8 mg/dL (ref 8.9–10.3)
Chloride: 105 mmol/L (ref 98–111)
Creatinine: 1.41 mg/dL — ABNORMAL HIGH (ref 0.44–1.00)
GFR, Estimated: 37 mL/min — ABNORMAL LOW (ref >60)
Glucose, Bld: 109 mg/dL — ABNORMAL HIGH (ref 70–99)
Potassium: 4.4 mmol/L (ref 3.5–5.1)
Sodium: 141 mmol/L (ref 135–145)
Total Bilirubin: 0.5 mg/dL (ref 0.3–1.2)
Total Protein: 6.8 g/dL (ref 6.5–8.1)

## 2023-09-14 LAB — IRON AND IRON BINDING CAPACITY (CC-WL,HP ONLY)
Iron: 66 ug/dL (ref 28–170)
Saturation Ratios: 17 % (ref 10.4–31.8)
TIBC: 386 ug/dL (ref 250–450)
UIBC: 320 ug/dL (ref 148–442)

## 2023-09-14 LAB — FERRITIN: Ferritin: 29 ng/mL (ref 11–307)

## 2023-09-29 ENCOUNTER — Inpatient Hospital Stay (HOSPITAL_BASED_OUTPATIENT_CLINIC_OR_DEPARTMENT_OTHER): Payer: PPO | Admitting: Hematology

## 2023-09-29 DIAGNOSIS — C9210 Chronic myeloid leukemia, BCR/ABL-positive, not having achieved remission: Secondary | ICD-10-CM | POA: Diagnosis not present

## 2023-09-29 DIAGNOSIS — D509 Iron deficiency anemia, unspecified: Secondary | ICD-10-CM | POA: Diagnosis not present

## 2023-09-29 MED ORDER — POLYSACCHARIDE IRON COMPLEX 150 MG PO CAPS: 150.0000 mg | ORAL_CAPSULE | Freq: Every day | ORAL | 2 refills | Status: DC

## 2023-09-29 NOTE — Progress Notes (Signed)
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   CML was diagnosed in 2013. She presented with chronic phase and achieved with logical molecular remission.   Current treatment: Tasigna 300 mg bid started 11/2012 with complete hematological and molecular remission.  Therapy has been on hold due to health concerns in April 2022.   HISTORY OF PRESENTING ILLNESS:   Sara Warner is a wonderful 82 y.o. female who has been a previous patient of Dr. Clelia Croft. 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   Patient was last seen by Dr. Clelia Croft on 12/11/2022 and was doing well overall. She noted that her wound was slowly healing. Patient had procedure for peripheral vascular disease scheduled on December 25 2022.   Today, she presents in a wheelchair and is accompanied by her daughter. Patient was on Tasigna between early 2014-2022. Patient does not take Tasigna at this time, but notes that she did tolerate it well previously with no toxicity issues.  Patient was seen by vascular surgery for ulcer in her left heal. She reports that her arteries are healing and continues to be managed by wound care. Patient does endorse DM which is generally well controlled. She denies any fequent cramping in her lower extremities.   She reports that her kidney function fluctuates due to narrowing blood vessel in the kidneys. Patient had a stent tent placed which improved kidney function.  She does function independently at home and uses a walker regularly. She denies any major medication changes in the last 6 months.   She denies any recent infections, new bone pains, abdominal pain/distention, significant unexplained fever, chills, night sweats, sudden change in energy  levels, or leg swelling.   INTERVAL HISTORY:  Sara Warner is a 82 y.o. female here for continued evaluation and management of CML. She was last seen by me on 06/09/2023 and was doing well overall.   I connected with Salome Holmes on 09/29/23 at  8:40 AM EDT by telephone visit and verified that I am speaking with the correct person using two identifiers.   I discussed the limitations, risks, security and privacy concerns of performing an evaluation and management service by telemedicine and the availability of in-person appointments. I also discussed with the patient that there may be a patient responsible charge related to this service. The patient expressed understanding and agreed to proceed.   Other persons participating in the visit and their role in the encounter: none   Patient's location: home  Provider's location: [facility]   Chief Complaint: evaluation and management of CML    Today, she reports no new symptoms since her last visit. Patient notes that her leg ulcers have healed and denies any bleeding issues. Her cardiac issues have been stable and she denies any new medications or medication changes. She reports that she has not been taking oral iron.   The results of her recent lab workup was discussed with her in detail.  MEDICAL HISTORY:  Past Medical History:  Diagnosis Date   Atrial fibrillation (HCC)    Chronic atrial fibrillation (HCC)    a. Refuses OAC.  CHA2DS2VASc = 4.   Chronic systolic CHF (congestive heart failure) (HCC)    a. 05/2015 Echo: EF 35-40%.   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 06/2013 MV: low risk study w/o ischemia;  b. 05/2015 Echo: EF 35-40%, diff HK,  basal-midinferoseptal and basal-midanteroseptal AK. Mild-mod MR, mod dil LA, mild to mod TR, PASP .   PAD (peripheral artery disease) (HCC)    Pneumonia    30 yrs. ago   Renal artery stenosis (HCC)    s/p left renal artery stent 02/18/21    SURGICAL HISTORY: Past Surgical History:  Procedure Laterality Date   ABDOMINAL AORTOGRAM W/LOWER EXTREMITY Bilateral 12/06/2019   Procedure: ABDOMINAL AORTOGRAM W/LOWER EXTREMITY;  Surgeon: Cephus Shelling, MD;  [facility]: [facility];  Service: Cardiovascular;  Laterality: Bilateral;   ABDOMINAL AORTOGRAM W/LOWER EXTREMITY Bilateral 05/15/2020   Procedure: ABDOMINAL AORTOGRAM W/LOWER EXTREMITY;  Surgeon: Cephus Shelling, MD;  Location: [facility];  Service: Cardiovascular;  Laterality: Bilateral;   ABDOMINAL AORTOGRAM W/LOWER EXTREMITY N/A 02/18/2021   Procedure: ABDOMINAL AORTOGRAM W/LOWER EXTREMITY;  Surgeon: Nada Libman, MD;  Location: [facility];  Service: Cardiovascular;  Laterality: N/A;   ABDOMINAL HYSTERECTOMY     APPLICATION OF WOUND VAC  01/10/2020   BLEEDING FROM FEMORAL BYPASS   BIOPSY  04/08/2020   Procedure: BIOPSY;  Surgeon: Lynann Bologna, MD;  Location: [facility] ENDOSCOPY;  Service: Endoscopy;;   CARDIOVERSION     COLONOSCOPY WITH PROPOFOL N/A 04/08/2020   Procedure: COLONOSCOPY WITH PROPOFOL;  Surgeon: Lynann Bologna, MD;  Location: [facility] ENDOSCOPY;  Service: Endoscopy;  Laterality: N/A;   CYST EXCISION     removed on right ovary   CYSTECTOMY     ENDARTERECTOMY FEMORAL Left 06/20/2020   Procedure: LEFT COMMON FEMORAL ENDARTERECTOMY LEFT ABOVE KNEE POPLITEAL ENDARTERECTOMY;  Surgeon: Cephus Shelling, MD;  Location: [facility] OR;  Service: Vascular;  Laterality: Left;   ENDARTERECTOMY FEMORAL Left 02/24/2021   Procedure: REDO LEFT COMMON FEMORAL ENDARTERECTOMY AND PROFUNDAPLASTY WTH BOVINE PATCH;  Surgeon: Cephus Shelling, MD;  Location: [facility] OR;  Service: Vascular;  Laterality: Left;   ENTEROSCOPY N/A  03/13/2021   Procedure: ENTEROSCOPY;  Surgeon: Beverley Fiedler, MD;  Location: [facility] ENDOSCOPY;  Service: Gastroenterology;  Laterality: N/A;   ESOPHAGOGASTRODUODENOSCOPY (EGD) WITH PROPOFOL N/A 04/08/2020   Procedure: ESOPHAGOGASTRODUODENOSCOPY (EGD) WITH PROPOFOL;  Surgeon: Lynann Bologna, MD;  Location: [facility] ENDOSCOPY;  Service: Endoscopy;  Laterality: N/A;   ESOPHAGOGASTRODUODENOSCOPY (EGD) WITH PROPOFOL N/A 03/11/2021   Procedure: ESOPHAGOGASTRODUODENOSCOPY (EGD) WITH PROPOFOL;  Surgeon: Beverley Fiedler, MD;  Location: [facility] ENDOSCOPY;  Service: Gastroenterology;  Laterality: N/A;   FEMORAL ARTERY - POPLITEAL ARTERY BYPASS GRAFT  12/15/2019   Right common femoral artery to below-knee popliteal artery bypass with ipsilateral nonreversed great saphenous vein   FEMORAL-POPLITEAL BYPASS GRAFT Right 12/15/2019   Procedure: BYPASS GRAFT FEMORAL-POPLITEAL ARTERY;  Surgeon: Cephus Shelling, MD;  Location: [facility] OR;  Service: Vascular;  Laterality: Right;   FEMORAL-POPLITEAL BYPASS GRAFT Left 06/20/2020   Procedure: BYPASS GRAFT FEMORAL TO ABOVE KNEE POPLITEAL ARTERY WITH HARVENSTED NON REVERSED GREATER SAPHENOUS VEIN;  Surgeon: Cephus Shelling, MD;  Location: [facility] OR;  Service: Vascular;  Laterality: Left;   FEMORAL-POPLITEAL BYPASS GRAFT Left 02/24/2021   Procedure: REDO LEFT COMMON FEMORAL ARTERY EXPOSURE AND LEFT COMMON FEMORAL ARTERY TO BELOW KNEE  POPLITEAL ARTERY BYPASS GRAFT WITH PROPATEN VASCULAR GRAFT;  Surgeon: Cephus Shelling, MD;  Location: [facility] OR;  Service: Vascular;  Laterality: Left;   GIVENS CAPSULE STUDY N/A 03/11/2021   Procedure: GIVENS CAPSULE STUDY;  Surgeon: Beverley Fiedler, MD;  Location: [facility] ENDOSCOPY;  Service: Gastroenterology;  Laterality: N/A;   GIVENS CAPSULE STUDY N/A 03/13/2021   Procedure: GIVENS CAPSULE STUDY;  Surgeon: Beverley Fiedler, MD;  Location: [facility] ENDOSCOPY;  Service: Gastroenterology;  Laterality: N/A;   HOT HEMOSTASIS N/A 03/11/2021   Procedure: HOT HEMOSTASIS (ARGON PLASMA  COAGULATION/BICAP);  Surgeon: Beverley Fiedler, MD;  Location: [facility] ENDOSCOPY;  Service: Gastroenterology;  Laterality: N/A;   HYSTEROTOMY     PERIPHERAL VASCULAR INTERVENTION Right 12/06/2019   Procedure: PERIPHERAL VASCULAR INTERVENTION;  Surgeon: Cephus Shelling, MD;  [facility]: [facility];  Service: Cardiovascular;  Laterality: Right;  EXT ILIAC   PERIPHERAL VASCULAR INTERVENTION Left 02/18/2021   Procedure: PERIPHERAL VASCULAR INTERVENTION;  Surgeon: Nada Libman, MD;  Location: [facility];  Service: Cardiovascular;  Laterality: Left;  renal artery   POLYPECTOMY  04/08/2020   Procedure: POLYPECTOMY;  Surgeon: Lynann Bologna, MD;  Location: [facility] ENDOSCOPY;  Service: Endoscopy;;   WOUND EXPLORATION Right 01/10/2020   Procedure: right lower leg Wound Exploration with wound vac application;  Surgeon: Nada Libman, MD;  Location: [facility] OR;  Service: Vascular;  Laterality: Right;    SOCIAL HISTORY: Social History   Socioeconomic History   Marital status: Single    Spouse name: Not on file   Number of children: Not on file   Years of education: Not on file   Highest education level: Not on file  Occupational History   Not on file  Tobacco Use   Smoking status: Former    Current packs/day: 0.00    Types: Cigarettes    Quit date: 01/01/1984    Years since quitting: 39.7   Smokeless tobacco: Never  Vaping Use   Vaping status: Never Used  Substance and Sexual Activity   Alcohol use: No   Drug use: Never   Sexual activity: Not on file  Other Topics Concern   Not on file  Social History Narrative   Not on file   Social Determinants of Health   Financial Resource Strain: Not on file  Food Insecurity: Low Risk  (12/25/2022)   [facility]   Hunger Vital Sign    Worried About Running Out of Food in the Last Year: Never true    Within the past 12 months, the food you bought just didn't last and you didn't have money to get more: Not on file   Transportation Needs: No Transportation Needs (12/25/2022)   [facility]   Transportation    In the past 12 months, has lack of reliable transportation kept you from medical appointments, meetings, work or from getting things needed for daily living? : No  Physical Activity: Not on file  Stress: Not on file  Social Connections: Not on file  Intimate Partner Violence: Low Risk  (12/25/2022)   [facility] visits prior to 01/30/2023., [facility] visits prior to 01/30/2023.   Safety    How often does anyone, including family and friends, physically hurt you?: Never    How often does anyone, including family and friends, insult or talk down to you?: Never    How often does anyone, including family and  friends, threaten you with harm?: Never    How often does anyone, including family and friends, scream or curse at you?: Never    FAMILY HISTORY: Family History  Problem Relation Age of Onset   Cancer - Colon Mother        mast to lungs   Diabetes Father    Diabetes Paternal Grandmother     ALLERGIES:  is allergic to lisinopril and losartan.  MEDICATIONS:  Current Outpatient Medications  Medication Sig Dispense Refill   allopurinol (ZYLOPRIM) 100 MG tablet Take 100 mg by mouth daily.     amoxicillin-clavulanate (AUGMENTIN) 875-125 MG tablet Take 1 tablet by mouth 2 (two) times daily. (Patient not taking: Reported on 06/09/2023) 14 tablet 0   aspirin EC 81 MG tablet Take 81 mg by mouth daily.     cetirizine (ZYRTEC) 10 MG tablet Take 10 mg by mouth daily.     clorazepate (TRANXENE-T) 7.5 MG tablet Take 1 tablet (7.5 mg total) by mouth 2 (two) times daily as needed for anxiety. 30 tablet 1   colchicine 0.6 MG tablet Take 0.6 mg by mouth 2 (two) times daily.     diclofenac Sodium (VOLTAREN) 1 % GEL SMARTSIG:1 Gram(s) Topical 4 Times Daily PRN     digoxin (LANOXIN) 0.125 MG tablet TAKE 0.5 TABLET (0.0625 MG  TOTAL) BY MOUTH EVERY OTHER DAY. 45 tablet 5   Dulaglutide (TRULICITY) 4.5 MG/0.5ML SOPN Inject 4.5 mg into the skin once a week.     Dulaglutide 3 MG/0.5ML SOPN Inject 3 mg into the skin once a week. (Patient not taking: Reported on 06/09/2023)     glipiZIDE (GLUCOTROL XL) 10 MG 24 hr tablet Take 10 mg by mouth 2 (two) times daily.      iron polysaccharides (NIFEREX) 150 MG capsule Take 150 mg by mouth daily.     metFORMIN (GLUCOPHAGE) 500 MG tablet Take 2,000 mg by mouth daily.     metoprolol (TOPROL-XL) 200 MG 24 hr tablet TAKE 1 TABLET BY MOUTH EVERY DAY 90 tablet 3   nilotinib (TASIGNA) 150 MG capsule Take 2 capsules (300 mg total) by mouth every 12 (twelve) hours. Take on an empty stomach, 1 hour before or 2 hours after meals. (Patient not taking: Reported on 06/09/2023) 120 capsule 0   oxyCODONE-acetaminophen (PERCOCET/ROXICET) 5-325 MG tablet Take 1 tablet by mouth every 4 (four) hours as needed for severe pain. 30 tablet 0   pantoprazole (PROTONIX) 40 MG tablet Take 40 mg by mouth daily.     rosuvastatin (CRESTOR) 10 MG tablet TAKE 1 TABLET BY MOUTH EVERY DAY 90 tablet 1   sitaGLIPtin (JANUVIA) 50 MG tablet Take 50 mg by mouth daily. (Patient not taking: Reported on 06/09/2023)     sucralfate (CARAFATE) 1 g tablet Take 1 g by mouth 4 (four) times daily. (Patient not taking: Reported on 06/09/2023)     SYNTHROID 100 MCG tablet Take 100 mcg by mouth daily.     torsemide (DEMADEX) 20 MG tablet Take 20 mg by mouth daily. Take 1 Tablet Daily.     No current facility-administered medications for this visit.    REVIEW OF SYSTEMS:    10 Point review of Systems was done is negative except as noted above.   PHYSICAL EXAMINATION: TELEMEDICINE VISIT ECOG PERFORMANCE STATUS: 2 - Symptomatic, <50% confined to bed  . There were no vitals filed for this visit.  There were no vitals filed for this visit.  .There is no height or weight on file  to calculate BMI.   LABORATORY DATA:  I have  reviewed the data as listed  .    Latest Ref Rng & Units 09/14/2023    2:14 PM 06/09/2023    2:01 PM 12/11/2022   12:56 PM  CBC  WBC 4.0 - 10.5 K/uL 9.5  9.6  9.3   Hemoglobin 12.0 - 15.0 g/dL 81.1  91.4  78.2   Hematocrit 36.0 - 46.0 % 37.5  38.2  39.9   Platelets 150 - 400 K/uL 349  351  373     .    Latest Ref Rng & Units 09/14/2023    2:14 PM 06/09/2023    2:01 PM 12/11/2022   12:56 PM  CMP  Glucose 70 - 99 mg/dL 956  213  086   BUN 8 - 23 mg/dL 29  30  28    Creatinine 0.44 - 1.00 mg/dL 5.78  4.69  6.29   Sodium 135 - 145 mmol/L 141  139  138   Potassium 3.5 - 5.1 mmol/L 4.4  4.4  4.5   Chloride 98 - 111 mmol/L 105  102  106   CO2 22 - 32 mmol/L 27  23  26    Calcium 8.9 - 10.3 mg/dL 9.8  9.7  9.9   Total Protein 6.5 - 8.1 g/dL 6.8  6.8  7.0   Total Bilirubin 0.3 - 1.2 mg/dL 0.5  0.5  0.6   Alkaline Phos 38 - 126 U/L 44  41  43   AST 15 - 41 U/L 24  14  17    ALT 0 - 44 U/L 33  15  20      RADIOGRAPHIC STUDIES: I have personally reviewed the radiological images as listed and agreed with the findings in the report. No results found.  ASSESSMENT & PLAN:   82 y.o. female with:  1.  Chronic phase CML diagnosed in 2013.  She achieved molecular and hematological remission but off treatment due to vascular disease and health issues.   2. Iron deficiency anemia  3. Peripheral vascular disease, continues to follow with vascular surgery.   PLAN:  -Discussed lab results from 09/14/2023 in detail with patient. CBC normal, showed WBC of 9.5K, hemoglobin of 12.2, and platelets of 349K. -CMP is stable and shows stable CKD with creatinine level of 1.4 mg/dL -She was noted to continue to be iron deficient. Ferritin level was 29 and iron saturation was 17%. Patient reports that she has not been taking her oral iron. She was recommended to start taking her oral iron to optimize her iron levels.  -BCR/ABL labs were not drawn. I discussed the option for her to get labs drawn today,  but patient would like to have BCR/ABL drawn with her next follow-up in 3-4 months -She continues to be off of Tasigna with no clinical or lab evidence of progression of CML -repeat labs in 12 weeks -return to clinic in 14 weeks  FOLLOW-UP: Labs in 12 weeks RTC with Dr Candise Che in 14 weeks  The total time spent in the appointment was 30 minutes* .  All of the patient's questions were answered with apparent satisfaction. The patient knows to call the clinic with any problems, questions or concerns.   Wyvonnia Lora MD MS AAHIVMS [facility] Hematology/Oncology Physician [facility]  .*Total Encounter Time as defined by the Centers for Medicare and Medicaid Services includes, in addition to the face-to-face time of a patient visit (documented in the note above) non-face-to-face  time: obtaining and reviewing outside history, ordering and reviewing medications, tests or procedures, care coordination (communications with other health care professionals or caregivers) and documentation in the medical record.    I,Mitra Faeizi,acting as a Neurosurgeon for Wyvonnia Lora, MD.,have documented all relevant documentation on the behalf of Wyvonnia Lora, MD,as directed by  Wyvonnia Lora, MD while in the presence of Wyvonnia Lora, MD.  .I have reviewed the above documentation for accuracy and completeness, and I agree with the above. Johney Maine MD

## 2023-10-02 ENCOUNTER — Telehealth: Payer: Self-pay | Admitting: Hematology

## 2023-10-02 NOTE — Telephone Encounter (Signed)
Patient is aware of scheduled appointment times/dates for follow up per [facility] 10/30 LOS

## 2023-10-04 ENCOUNTER — Encounter: Payer: Self-pay | Admitting: Hematology

## 2023-11-29 DIAGNOSIS — I509 Heart failure, unspecified: Secondary | ICD-10-CM | POA: Diagnosis not present

## 2023-11-29 DIAGNOSIS — Z7902 Long term (current) use of antithrombotics/antiplatelets: Secondary | ICD-10-CM | POA: Diagnosis not present

## 2023-11-29 DIAGNOSIS — I739 Peripheral vascular disease, unspecified: Secondary | ICD-10-CM | POA: Diagnosis not present

## 2023-11-29 DIAGNOSIS — I480 Paroxysmal atrial fibrillation: Secondary | ICD-10-CM | POA: Diagnosis not present

## 2023-11-29 DIAGNOSIS — E261 Secondary hyperaldosteronism: Secondary | ICD-10-CM | POA: Diagnosis not present

## 2023-12-07 ENCOUNTER — Ambulatory Visit: Payer: PPO | Admitting: Cardiovascular Disease

## 2023-12-15 DIAGNOSIS — E890 Postprocedural hypothyroidism: Secondary | ICD-10-CM | POA: Diagnosis not present

## 2023-12-15 DIAGNOSIS — N184 Chronic kidney disease, stage 4 (severe): Secondary | ICD-10-CM | POA: Diagnosis not present

## 2023-12-15 DIAGNOSIS — E1151 Type 2 diabetes mellitus with diabetic peripheral angiopathy without gangrene: Secondary | ICD-10-CM | POA: Diagnosis not present

## 2023-12-15 DIAGNOSIS — M109 Gout, unspecified: Secondary | ICD-10-CM | POA: Diagnosis not present

## 2023-12-15 DIAGNOSIS — I10 Essential (primary) hypertension: Secondary | ICD-10-CM | POA: Diagnosis not present

## 2023-12-15 DIAGNOSIS — D509 Iron deficiency anemia, unspecified: Secondary | ICD-10-CM | POA: Diagnosis not present

## 2023-12-15 DIAGNOSIS — K2950 Unspecified chronic gastritis without bleeding: Secondary | ICD-10-CM | POA: Diagnosis not present

## 2023-12-15 DIAGNOSIS — E782 Mixed hyperlipidemia: Secondary | ICD-10-CM | POA: Diagnosis not present

## 2023-12-15 DIAGNOSIS — I739 Peripheral vascular disease, unspecified: Secondary | ICD-10-CM | POA: Diagnosis not present

## 2023-12-15 DIAGNOSIS — Z79899 Other long term (current) drug therapy: Secondary | ICD-10-CM | POA: Diagnosis not present

## 2023-12-15 DIAGNOSIS — C9110 Chronic lymphocytic leukemia of B-cell type not having achieved remission: Secondary | ICD-10-CM | POA: Diagnosis not present

## 2023-12-21 DIAGNOSIS — Z95828 Presence of other vascular implants and grafts: Secondary | ICD-10-CM | POA: Insufficient documentation

## 2023-12-22 ENCOUNTER — Inpatient Hospital Stay: Payer: PPO

## 2024-01-05 ENCOUNTER — Ambulatory Visit: Payer: PPO | Admitting: Hematology

## 2024-01-06 ENCOUNTER — Ambulatory Visit: Payer: PPO | Admitting: Cardiovascular Disease

## 2024-02-01 ENCOUNTER — Other Ambulatory Visit: Payer: Self-pay

## 2024-02-01 DIAGNOSIS — C9210 Chronic myeloid leukemia, BCR/ABL-positive, not having achieved remission: Secondary | ICD-10-CM

## 2024-02-02 ENCOUNTER — Inpatient Hospital Stay: Payer: PPO | Attending: Hematology

## 2024-02-02 DIAGNOSIS — Z87891 Personal history of nicotine dependence: Secondary | ICD-10-CM | POA: Diagnosis not present

## 2024-02-02 DIAGNOSIS — Z800 Family history of malignant neoplasm of digestive organs: Secondary | ICD-10-CM | POA: Insufficient documentation

## 2024-02-02 DIAGNOSIS — D509 Iron deficiency anemia, unspecified: Secondary | ICD-10-CM | POA: Diagnosis not present

## 2024-02-02 DIAGNOSIS — I739 Peripheral vascular disease, unspecified: Secondary | ICD-10-CM | POA: Insufficient documentation

## 2024-02-02 DIAGNOSIS — C9210 Chronic myeloid leukemia, BCR/ABL-positive, not having achieved remission: Secondary | ICD-10-CM | POA: Diagnosis not present

## 2024-02-02 LAB — CMP (CANCER CENTER ONLY)
ALT: 24 U/L (ref 0–44)
AST: 22 U/L (ref 15–41)
Albumin: 4.5 g/dL (ref 3.5–5.0)
Alkaline Phosphatase: 44 U/L (ref 38–126)
Anion gap: 8 (ref 5–15)
BUN: 30 mg/dL — ABNORMAL HIGH (ref 8–23)
CO2: 25 mmol/L (ref 22–32)
Calcium: 9.5 mg/dL (ref 8.9–10.3)
Chloride: 106 mmol/L (ref 98–111)
Creatinine: 1.38 mg/dL — ABNORMAL HIGH (ref 0.44–1.00)
GFR, Estimated: 38 mL/min — ABNORMAL LOW (ref >60)
Glucose, Bld: 220 mg/dL — ABNORMAL HIGH (ref 70–99)
Potassium: 4 mmol/L (ref 3.5–5.1)
Sodium: 139 mmol/L (ref 135–145)
Total Bilirubin: 0.4 mg/dL (ref 0.0–1.2)
Total Protein: 6.7 g/dL (ref 6.5–8.1)

## 2024-02-02 LAB — CBC WITH DIFFERENTIAL (CANCER CENTER ONLY)
Abs Immature Granulocytes: 0.06 10*3/uL (ref 0.00–0.07)
Basophils Absolute: 0.1 10*3/uL (ref 0.0–0.1)
Basophils Relative: 1 %
Eosinophils Absolute: 0.8 10*3/uL — ABNORMAL HIGH (ref 0.0–0.5)
Eosinophils Relative: 8 %
HCT: 38.4 % (ref 36.0–46.0)
Hemoglobin: 12.5 g/dL (ref 12.0–15.0)
Immature Granulocytes: 1 %
Lymphocytes Relative: 27 %
Lymphs Abs: 2.6 10*3/uL (ref 0.7–4.0)
MCH: 31.9 pg (ref 26.0–34.0)
MCHC: 32.6 g/dL (ref 30.0–36.0)
MCV: 98 fL (ref 80.0–100.0)
Monocytes Absolute: 0.6 10*3/uL (ref 0.1–1.0)
Monocytes Relative: 6 %
Neutro Abs: 5.6 10*3/uL (ref 1.7–7.7)
Neutrophils Relative %: 57 %
Platelet Count: 321 10*3/uL (ref 150–400)
RBC: 3.92 MIL/uL (ref 3.87–5.11)
RDW: 14.9 % (ref 11.5–15.5)
WBC Count: 9.7 10*3/uL (ref 4.0–10.5)
nRBC: 0 % (ref 0.0–0.2)

## 2024-02-02 LAB — IRON AND IRON BINDING CAPACITY (CC-WL,HP ONLY)
Iron: 43 ug/dL (ref 28–170)
Saturation Ratios: 11 % (ref 10.4–31.8)
TIBC: 400 ug/dL (ref 250–450)
UIBC: 357 ug/dL (ref 148–442)

## 2024-02-02 LAB — FERRITIN: Ferritin: 29 ng/mL (ref 11–307)

## 2024-02-10 LAB — BCR-ABL1, CML/ALL, PCR, QUANT
E1A2 Transcript: <0.0032 %
Interpretation (BCRAL):: NEGATIVE
b2a2 transcript: <0.0032 %
b3a2 transcript: <0.0032 %

## 2024-02-11 ENCOUNTER — Encounter: Payer: Self-pay | Admitting: Hematology

## 2024-02-11 ENCOUNTER — Inpatient Hospital Stay: Payer: PPO | Admitting: Hematology

## 2024-02-11 VITALS — BP 122/66 | HR 77 | Temp 97.3°F | Resp 16 | Ht 68.0 in | Wt 189.9 lb

## 2024-02-11 DIAGNOSIS — C9210 Chronic myeloid leukemia, BCR/ABL-positive, not having achieved remission: Secondary | ICD-10-CM | POA: Diagnosis not present

## 2024-02-11 MED ORDER — POLYSACCHARIDE IRON COMPLEX 150 MG PO CAPS: 150.0000 mg | ORAL_CAPSULE | Freq: Every day | ORAL | 5 refills | Status: DC

## 2024-02-14 ENCOUNTER — Telehealth: Payer: Self-pay | Admitting: Hematology

## 2024-02-14 NOTE — Telephone Encounter (Signed)
 Spoke with patient confirming upcoming appointment

## 2024-02-18 ENCOUNTER — Encounter: Payer: Self-pay | Admitting: Hematology

## 2024-02-18 NOTE — Progress Notes (Signed)
 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   CML was diagnosed in 2013. She presented with chronic phase and achieved with logical molecular remission.   Current treatment: Tasigna 300 mg bid started 11/2012 with complete hematological and molecular remission.  Therapy has been on hold due to health concerns in April 2022.   HISTORY OF PRESENTING ILLNESS:   Sara Warner is a wonderful 83 y.o. female who has been a previous patient of Dr. Clelia Croft. 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   Patient was last seen by Dr. Clelia Croft on 12/11/2022 and was doing well overall. She noted that her wound was slowly healing. Patient had procedure for peripheral vascular disease scheduled on December 25 2022.   Today, she presents in a wheelchair and is accompanied by her daughter. Patient was on Tasigna between early 2014-2022. Patient does not take Tasigna at this time, but notes that she did tolerate it well previously with no toxicity issues.  Patient was seen by vascular surgery for ulcer in her left heal. She reports that her arteries are healing and continues to be managed by wound care. Patient does endorse DM which is generally well controlled. She denies any fequent cramping in her lower extremities.   She reports that her kidney function fluctuates due to narrowing blood vessel in the kidneys. Patient had a stent tent placed which improved kidney function.  She does function independently at home and uses a walker regularly. She denies any major medication changes in the last 6 months.   She denies any recent infections, new bone pains, abdominal pain/distention, significant unexplained fever, chills, night sweats, sudden change in energy  levels, or leg swelling.   INTERVAL HISTORY:  Sara Warner is a 83 y.o. female is here for continued evaluation and management of CML.  Notes no acute new symptoms since her last clinic visit. No fevers no chills no night sweats.  No new bone pains  Continues to  remain off Tasigna since April 2022. Labs done but discussed with her in details.  MEDICAL HISTORY:  Past Medical History:  Diagnosis Date   Atrial fibrillation (HCC)    Chronic atrial fibrillation (HCC)    a. Refuses OAC.  CHA2DS2VASc = 4.   Chronic systolic CHF (congestive heart failure) (HCC)    a. 05/2015 Echo: EF 35-40%.   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 06/2013 MV: low risk study w/o ischemia;  b. 05/2015 Echo: EF 35-40%, diff HK, basal-midinferoseptal and basal-midanteroseptal AK. Mild-mod MR, mod dil LA, mild to mod TR, PASP .   PAD (peripheral artery disease) (HCC)    Pneumonia    30 yrs. ago   Renal artery stenosis (HCC)    s/p left renal artery stent 02/18/21    SURGICAL HISTORY: Past Surgical History:  Procedure Laterality Date   ABDOMINAL AORTOGRAM W/LOWER EXTREMITY Bilateral 12/06/2019   Procedure: ABDOMINAL AORTOGRAM W/LOWER EXTREMITY;  Surgeon: Cephus Shelling,  MD;  Location: [facility];  Service: Cardiovascular;  Laterality: Bilateral;   ABDOMINAL AORTOGRAM W/LOWER EXTREMITY Bilateral 05/15/2020   Procedure: ABDOMINAL AORTOGRAM W/LOWER EXTREMITY;  Surgeon: Cephus Shelling, MD;  Location: [facility];  Service: Cardiovascular;  Laterality: Bilateral;   ABDOMINAL AORTOGRAM W/LOWER EXTREMITY N/A 02/18/2021   Procedure: ABDOMINAL AORTOGRAM W/LOWER EXTREMITY;  Surgeon: Nada Libman, MD;   Location: [facility];  Service: Cardiovascular;  Laterality: N/A;   ABDOMINAL HYSTERECTOMY     APPLICATION OF WOUND VAC  01/10/2020   BLEEDING FROM FEMORAL BYPASS   BIOPSY  04/08/2020   Procedure: BIOPSY;  Surgeon: Lynann Bologna, MD;  Location: [facility] ENDOSCOPY;  Service: Endoscopy;;   CARDIOVERSION     COLONOSCOPY WITH PROPOFOL N/A 04/08/2020   Procedure: COLONOSCOPY WITH PROPOFOL;  Surgeon: Lynann Bologna, MD;  Location: [facility] ENDOSCOPY;  Service: Endoscopy;  Laterality: N/A;   CYST EXCISION     removed on right ovary   CYSTECTOMY     ENDARTERECTOMY FEMORAL Left 06/20/2020   Procedure: LEFT COMMON FEMORAL ENDARTERECTOMY LEFT ABOVE KNEE POPLITEAL ENDARTERECTOMY;  Surgeon: Cephus Shelling, MD;  Location: [facility] OR;  Service: Vascular;  Laterality: Left;   ENDARTERECTOMY FEMORAL Left 02/24/2021   Procedure: REDO LEFT COMMON FEMORAL ENDARTERECTOMY AND PROFUNDAPLASTY WTH BOVINE PATCH;  Surgeon: Cephus Shelling, MD;  Location: [facility] OR;  Service: Vascular;  Laterality: Left;   ENTEROSCOPY N/A 03/13/2021   Procedure: ENTEROSCOPY;  Surgeon: Beverley Fiedler, MD;  Location: [facility] ENDOSCOPY;  Service: Gastroenterology;  Laterality: N/A;   ESOPHAGOGASTRODUODENOSCOPY (EGD) WITH PROPOFOL N/A 04/08/2020   Procedure: ESOPHAGOGASTRODUODENOSCOPY (EGD) WITH PROPOFOL;  Surgeon: Lynann Bologna, MD;  Location: [facility] ENDOSCOPY;  Service: Endoscopy;  Laterality: N/A;   ESOPHAGOGASTRODUODENOSCOPY (EGD) WITH PROPOFOL N/A 03/11/2021   Procedure: ESOPHAGOGASTRODUODENOSCOPY (EGD) WITH PROPOFOL;  Surgeon: Beverley Fiedler, MD;  Location: [facility] ENDOSCOPY;  Service: Gastroenterology;  Laterality: N/A;   FEMORAL ARTERY - POPLITEAL ARTERY BYPASS GRAFT  12/15/2019   Right common femoral artery to below-knee popliteal artery bypass with ipsilateral nonreversed great saphenous vein   FEMORAL-POPLITEAL BYPASS GRAFT Right 12/15/2019   Procedure: BYPASS GRAFT FEMORAL-POPLITEAL ARTERY;  Surgeon: Cephus Shelling, MD;  Location: [facility] OR;  Service:  Vascular;  Laterality: Right;   FEMORAL-POPLITEAL BYPASS GRAFT Left 06/20/2020   Procedure: BYPASS GRAFT FEMORAL TO ABOVE KNEE POPLITEAL ARTERY WITH HARVENSTED NON REVERSED GREATER SAPHENOUS VEIN;  Surgeon: Cephus Shelling, MD;  Location: [facility] OR;  Service: Vascular;  Laterality: Left;   FEMORAL-POPLITEAL BYPASS GRAFT Left 02/24/2021   Procedure: REDO LEFT COMMON FEMORAL ARTERY EXPOSURE AND LEFT COMMON FEMORAL ARTERY TO BELOW KNEE POPLITEAL ARTERY BYPASS GRAFT WITH PROPATEN VASCULAR GRAFT;  Surgeon: Cephus Shelling, MD;  Location: [facility] OR;  Service: Vascular;  Laterality: Left;   GIVENS CAPSULE STUDY N/A 03/11/2021   Procedure: GIVENS CAPSULE STUDY;  Surgeon: Beverley Fiedler, MD;  Location: [facility] ENDOSCOPY;  Service: Gastroenterology;  Laterality: N/A;   GIVENS CAPSULE STUDY N/A 03/13/2021   Procedure: GIVENS CAPSULE STUDY;  Surgeon: Beverley Fiedler, MD;  Location: [facility] ENDOSCOPY;  Service: Gastroenterology;  Laterality: N/A;   HOT HEMOSTASIS N/A 03/11/2021   Procedure: HOT HEMOSTASIS (ARGON PLASMA COAGULATION/BICAP);  Surgeon: Beverley Fiedler, MD;  Location: [facility] ENDOSCOPY;  Service: Gastroenterology;  Laterality: N/A;   HYSTEROTOMY     PERIPHERAL VASCULAR INTERVENTION Right 12/06/2019   Procedure: PERIPHERAL VASCULAR INTERVENTION;  Surgeon: Cephus Shelling, MD;  [facility]: [facility];  Service: Cardiovascular;  Laterality: Right;  EXT  ILIAC   PERIPHERAL VASCULAR INTERVENTION Left 02/18/2021   Procedure: PERIPHERAL VASCULAR INTERVENTION;  Surgeon: Nada Libman, MD;  [facility]: [facility];  Service: Cardiovascular;  Laterality: Left;  renal artery   POLYPECTOMY  04/08/2020   Procedure: POLYPECTOMY;  Surgeon: Lynann Bologna, MD;  Location: [facility] ENDOSCOPY;  Service: Endoscopy;;   WOUND EXPLORATION Right 01/10/2020   Procedure: right lower leg Wound Exploration with wound vac application;  Surgeon: Nada Libman, MD;  Location: [facility] OR;  Service: Vascular;  Laterality: Right;    SOCIAL  HISTORY: Social History   Socioeconomic History   Marital status: Single    Spouse name: Not on file   Number of children: Not on file   Years of education: Not on file   Highest education level: Not on file  Occupational History   Not on file  Tobacco Use   Smoking status: Former    Current packs/day: 0.00    Types: Cigarettes    Quit date: 01/01/1984    Years since quitting: 40.1   Smokeless tobacco: Never  Vaping Use   Vaping status: Never Used  Substance and Sexual Activity   Alcohol use: No   Drug use: Never   Sexual activity: Not on file  Other Topics Concern   Not on file  Social History Narrative   Not on file   Social Drivers of Health   Financial Resource Strain: Not on file  Food Insecurity: Low Risk  (12/25/2022)   [facility]   Hunger Vital Sign    Worried About Running Out of Food in the Last Year: Never true    Within the past 12 months, the food you bought just didn't last and you didn't have money to get more: Not on file  Transportation Needs: No Transportation Needs (12/25/2022)   [facility]   Transportation    In the past 12 months, has lack of reliable transportation kept you from medical appointments, meetings, work or from getting things needed for daily living? : No  Physical Activity: Not on file  Stress: Not on file  Social Connections: Not on file  Intimate Partner Violence: Low Risk  (12/25/2022)   [facility] from [facility] visits prior to 01/30/2023., [facility] visits prior to 01/30/2023.   Safety    How often does anyone, including family and friends, physically hurt you?: Never    How often does anyone, including family and friends, insult or talk down to you?: Never    How often does anyone, including family and friends, threaten you with harm?: Never    How often does anyone, including family and friends, scream or curse at you?: Never     FAMILY HISTORY: Family History  Problem Relation Age of Onset   Cancer - Colon Mother        mast to lungs   Diabetes Father    Diabetes Paternal Grandmother     ALLERGIES:  is allergic to lisinopril and losartan.  MEDICATIONS:  Current Outpatient Medications  Medication Sig Dispense Refill   allopurinol (ZYLOPRIM) 100 MG tablet Take 100 mg by mouth daily.     amoxicillin-clavulanate (AUGMENTIN) 875-125 MG tablet Take 1 tablet by mouth 2 (two) times daily. (Patient not taking: Reported on 06/09/2023) 14 tablet 0   aspirin EC 81 MG tablet Take 81 mg by mouth daily.     cetirizine (ZYRTEC) 10 MG tablet Take 10 mg  by mouth daily.     clorazepate (TRANXENE-T) 7.5 MG tablet Take 1 tablet (7.5 mg total) by mouth 2 (two) times daily as needed for anxiety. 30 tablet 1   colchicine 0.6 MG tablet Take 0.6 mg by mouth 2 (two) times daily.     diclofenac Sodium (VOLTAREN) 1 % GEL SMARTSIG:1 Gram(s) Topical 4 Times Daily PRN     digoxin (LANOXIN) 0.125 MG tablet TAKE 0.5 TABLET (0.0625 MG TOTAL) BY MOUTH EVERY OTHER DAY. 45 tablet 5   Dulaglutide (TRULICITY) 4.5 MG/0.5ML SOPN Inject 4.5 mg into the skin once a week.     Dulaglutide 3 MG/0.5ML SOPN Inject 3 mg into the skin once a week. (Patient not taking: Reported on 06/09/2023)     glipiZIDE (GLUCOTROL XL) 10 MG 24 hr tablet Take 10 mg by mouth 2 (two) times daily.      iron polysaccharides (NIFEREX) 150 MG capsule Take 1 capsule (150 mg total) by mouth daily. 30 capsule 5   metFORMIN (GLUCOPHAGE) 500 MG tablet Take 2,000 mg by mouth daily.     metoprolol (TOPROL-XL) 200 MG 24 hr tablet TAKE 1 TABLET BY MOUTH EVERY DAY 90 tablet 3   nilotinib (TASIGNA) 150 MG capsule Take 2 capsules (300 mg total) by mouth every 12 (twelve) hours. Take on an empty stomach, 1 hour before or 2 hours after meals. (Patient not taking: Reported on 06/09/2023) 120 capsule 0   oxyCODONE-acetaminophen (PERCOCET/ROXICET) 5-325 MG tablet Take 1 tablet by mouth every 4  (four) hours as needed for severe pain. 30 tablet 0   pantoprazole (PROTONIX) 40 MG tablet Take 40 mg by mouth daily.     rosuvastatin (CRESTOR) 10 MG tablet TAKE 1 TABLET BY MOUTH EVERY DAY 90 tablet 1   sitaGLIPtin (JANUVIA) 50 MG tablet Take 50 mg by mouth daily. (Patient not taking: Reported on 06/09/2023)     sucralfate (CARAFATE) 1 g tablet Take 1 g by mouth 4 (four) times daily. (Patient not taking: Reported on 06/09/2023)     SYNTHROID 100 MCG tablet Take 100 mcg by mouth daily.     torsemide (DEMADEX) 20 MG tablet Take 20 mg by mouth daily. Take 1 Tablet Daily.     No current facility-administered medications for this visit.    REVIEW OF SYSTEMS:    .10 Point review of Systems was done is negative except as noted above.   PHYSICAL EXAMINATION: TELEMEDICINE VISIT ECOG PERFORMANCE STATUS: 2 - Symptomatic, <50% confined to bed  . Vitals:   02/11/24 1447  BP: 122/66  Pulse: 77  Resp: 16  Temp: (!) 97.3 F (36.3 C)  SpO2: 96%    Filed Weights   02/11/24 1447  Weight: 189 lb 14.4 oz (86.1 kg)    .Body mass index is 28.87 kg/m. Marland Kitchen GENERAL:alert, in no acute distress and comfortable SKIN: no acute rashes, no significant lesions EYES: conjunctiva are pink and non-injected, sclera anicteric OROPHARYNX: MMM, no exudates, no oropharyngeal erythema or ulceration NECK: supple, no JVD LYMPH:  no palpable lymphadenopathy in the cervical, axillary or inguinal regions LUNGS: clear to auscultation b/l with normal respiratory effort HEART: regular rate & rhythm ABDOMEN:  normoactive bowel sounds , non tender, not distended.  No palpable hepatosplenomegaly. Extremity: no pedal edema PSYCH: alert & oriented x 3 with fluent speech NEURO: no focal motor/sensory deficits   LABORATORY DATA:  I have reviewed the data as listed  .    Latest Ref Rng & Units 02/02/2024   12:32 PM 09/14/2023  2:14 PM 06/09/2023    2:01 PM  CBC  WBC 4.0 - 10.5 K/uL 9.7  9.5  9.6   Hemoglobin 12.0  - 15.0 g/dL 65.7  84.6  96.2   Hematocrit 36.0 - 46.0 % 38.4  37.5  38.2   Platelets 150 - 400 K/uL 321  349  351     .    Latest Ref Rng & Units 02/02/2024   12:32 PM 09/14/2023    2:14 PM 06/09/2023    2:01 PM  CMP  Glucose 70 - 99 mg/dL 952  841  324   BUN 8 - 23 mg/dL 30  29  30    Creatinine 0.44 - 1.00 mg/dL 4.01  0.27  2.53   Sodium 135 - 145 mmol/L 139  141  139   Potassium 3.5 - 5.1 mmol/L 4.0  4.4  4.4   Chloride 98 - 111 mmol/L 106  105  102   CO2 22 - 32 mmol/L 25  27  23    Calcium 8.9 - 10.3 mg/dL 9.5  9.8  9.7   Total Protein 6.5 - 8.1 g/dL 6.7  6.8  6.8   Total Bilirubin 0.0 - 1.2 mg/dL 0.4  0.5  0.5   Alkaline Phos 38 - 126 U/L 44  44  41   AST 15 - 41 U/L 22  24  14    ALT 0 - 44 U/L 24  33  15      RADIOGRAPHIC STUDIES: I have personally reviewed the radiological images as listed and agreed with the findings in the report. No results found.  ASSESSMENT & PLAN:   83 y.o. female with:  1.  Chronic phase CML diagnosed in 2013.  She achieved molecular and hematological remission but off treatment due to vascular disease and health issues.   2. Iron deficiency anemia  3. Peripheral vascular disease, continues to follow with vascular surgery.   PLAN:  -Discussed lab results from 02/02/2024. CBC within normal limits with a hemoglobin of 12.5, WBC count of 9.7k and platelets of 321k CMP stable with a creatinine of 1.38 blood sugar of 220 Ferritin of 29 with an iron saturation of 11% -BCR-ABL molecular PCR still remains undetectable for all transcripts -She continues to be off of Tasigna with no clinical or lab evidence of progression of CML. Recommended patient take.  Iron polysaccharide 150 mg p.o. daily for iron deficiency. Follow-up with PCP to determine need for further evaluation of iron deficiency based on goals of care. Labs in 24 weeks RTC with Dr Candise Che in 26 weeks   FOLLOW-UP:  Labs in 24 weeks RTC with Dr Candise Che in 26 weeks   .The total time spent  in the appointment was 20 minutes* .  All of the patient's questions were answered with apparent satisfaction. The patient knows to call the clinic with any problems, questions or concerns.   Wyvonnia Lora MD MS AAHIVMS [facility] Hematology/Oncology Physician [facility]  .*Total Encounter Time as defined by the Centers for Medicare and Medicaid Services includes, in addition to the face-to-face time of a patient visit (documented in the note above) non-face-to-face time: obtaining and reviewing outside history, ordering and reviewing medications, tests or procedures, care coordination (communications with other health care professionals or caregivers) and documentation in the medical record.

## 2024-03-01 ENCOUNTER — Other Ambulatory Visit: Payer: Self-pay | Admitting: Adult Health

## 2024-03-01 MED ORDER — METOPROLOL SUCCINATE ER 200 MG PO TB24: ORAL_TABLET | ORAL | 0 refills | Status: DC

## 2024-03-12 ENCOUNTER — Other Ambulatory Visit: Payer: Self-pay | Admitting: Cardiovascular Disease

## 2024-03-23 ENCOUNTER — Other Ambulatory Visit: Payer: Self-pay | Admitting: Cardiovascular Disease

## 2024-04-04 ENCOUNTER — Other Ambulatory Visit: Payer: Self-pay

## 2024-04-04 ENCOUNTER — Telehealth: Payer: Self-pay | Admitting: Cardiovascular Disease

## 2024-04-04 MED ORDER — ROSUVASTATIN CALCIUM 10 MG PO TABS: 10.0000 mg | ORAL_TABLET | Freq: Every day | ORAL | 0 refills | Status: DC

## 2024-04-04 NOTE — Telephone Encounter (Signed)
*  STAT* If patient is at the pharmacy, call can be transferred to refill team.   1. Which medications need to be refilled? (please list name of each medication and dose if known)   rosuvastatin  (CRESTOR ) 10 MG tablet   2. Would you like to learn more about the convenience, safety, & potential cost savings by using the [facility]?   3. Are you open to using the [facility] (Type [facility]. ).  4. Which pharmacy/location (including street and city if local pharmacy) is medication to be sent to?  [id number] - [place], [place] - [address] [facility]   5. Do they need a 30 day or 90 day supply?   90 day  Patient stated she only has a few tablets left.  Patient has appointment scheduled on 6/4 with J. Woody.

## 2024-04-16 ENCOUNTER — Other Ambulatory Visit: Payer: Self-pay | Admitting: Cardiovascular Disease

## 2024-04-29 ENCOUNTER — Other Ambulatory Visit: Payer: Self-pay | Admitting: Cardiology

## 2024-05-01 NOTE — Progress Notes (Signed)
 Cardiology Office Note   Date:  05/03/2024  ID:  Sanjuana Mruk, DOB Oct 20, 1941, MRN [id number] PCP: Aloha Arnold, PA-C  [facility] Providers Cardiologist:  Magnus Schuller, MD     History of Present Illness Sara Warner is a 83 y.o. female with a past medical history of permanent atrial fibrillation not on anticoagulation secondary to epistaxis, HFrEF, PAD--([facility]), renal artery stenosis s/p right renal artery stent, dyslipidemia, hypothyroidism, CKD stage IV (Dr. Zelda Hickman), DM 2, chronic myelocytic leukemia (Dr. Salomon Cree).  Multiple interventions for PAD with bypass and stenting, previously followed by Dr. Charlotte Cookey, now with [facility]. 02/04/2018 echo EF 45 to 50%, severe hypokinesis of the septal myocardium, mild MR, moderate biatrial enlargement 05/30/2015 echo EF 35 to 40%, diffuse hypokinesis, mild to moderate MR, mild to moderate TR  Ms. Cinquemani is a longstanding patient of [facility] dating back to 2013 for the management of atrial fibrillation.  Multiple prior vascular procedures including left distal foot and PT bypass angioplasty, follows with vascular at [facility]. Most recently she was evaluated by Friddie Jetty, NP in July 2023, she was stable from a cardiac perspective, not able to be very active due to ongoing wound issues, following very closely with vascular, no changes were made to her plan of care she was advised follow-up in 6 months.  She presents today for follow-up of her atrial fibrillation.  She was formally followed by Dr. Loetta Ringer however she would like to be evaluated with our office secondary to proximity.  She does not have any formal complaints.  She states she has been doing well, we have not seen her in almost 2 years with our practice however she states there have been no changes to her care from a cardiac perspective.  She was following with [facility] services at [facility] however she is now following with [facility], follows closely with them and has had  multiple interventions with bypass and stenting.  She does have a small place on her left heel today.  She follows closely with her PCP.  She is not on anticoagulant, per history it states there was apparently pretty severe episode of epistaxis, she is not entirely sure if this is the reason she is not on anticoagulant or not, but she has been in permanent atrial fibrillation for many years and has not been on anticoagulation since at least 2022. She denies chest pain, palpitations, dyspnea, pnd, orthopnea, n, v, dizziness, syncope, edema, weight gain, or early satiety.   ROS: Review of Systems  Musculoskeletal:  Positive for back pain.  Neurological:  Positive for tingling (bilateral feet).  All other systems reviewed and are negative.    Studies Reviewed      Cardiac Studies & Procedures   ______________________________________________________________________________________________   STRESS TESTS  NM MYOCAR MULTI W/SPECT W 01/17/2008   ECHOCARDIOGRAM  ECHOCARDIOGRAM COMPLETE 02/04/2018  Narrative *[facility]* [address] [place], [place] [address] [phone]  ------------------------------------------------------------------- Transthoracic Echocardiography  Patient:    Sara, Warner MR #:       [id number] Study Date: 02/04/2018 Gender:     F Age:        23 Height:     172.7 cm Weight:     92.5 kg BSA:        2.13 m^2 Pt. Status: Room:  ORDERING     Magnus Schuller, M.D. REFERRING    Magnus Schuller, M.D. ATTENDING    Alexandria Angel SONOGRAPHER  Sherel Dikes, RDCS PERFORMING   [facility], Outpatient  cc:  -------------------------------------------------------------------  LV EF: 45% -   50%  ------------------------------------------------------------------- Indications:      Atrial fibrillation (I48.2).  ------------------------------------------------------------------- History:   PMH:   Atrial fibrillation.  Non-ischemic cardiomyopathy.  Risk factors:   CKD stage 3. Former tobacco use. Hypertension. Diabetes mellitus.  ------------------------------------------------------------------- Study Conclusions  - Left ventricle: The cavity size was moderately dilated. Wall thickness was normal. Systolic function was mildly reduced. The estimated ejection fraction was in the range of 45% to 50%. There is severe hypokinesis of the septal myocardium. - Mitral valve: There was mild regurgitation. - Left atrium: The atrium was moderately dilated. - Right atrium: The atrium was moderately dilated. - Pulmonary arteries: PA peak pressure: 35 mm Hg (S).  Impressions:  - Severe septal hyokinesis with overall mild LV dysfunction; moderate LVE; mild MR; moderate biatrial enlargement; mild TR.  ------------------------------------------------------------------- Labs, prior tests, procedures, and surgery: Transthoracic echocardiography (05/30/2015).     EF was 35% and PA pressure was 37 (systolic).  ------------------------------------------------------------------- Study data:  Comparison was made to the study of 05/30/2015.  Study status:  Routine.  Procedure:  The patient reported no pain pre or post test. Transthoracic echocardiography. Image quality was adequate.  Study completion:  There were no complications. Transthoracic echocardiography.  M-mode, complete 2D, spectral Doppler, and color Doppler.  Birthdate:  Patient birthdate: 1941/08/12.  Age:  Patient is 83 yr old.  Sex:  Gender: female. BMI: 31 kg/m^2.  Blood pressure:     130/78  Patient status: Outpatient.  Study date:  Study date: 02/04/2018. Study time: 01:08 PM.  Location:  [facility]  -------------------------------------------------------------------  ------------------------------------------------------------------- Left ventricle:  The cavity size was moderately dilated. Wall thickness was normal. Systolic function was mildly reduced. The estimated ejection  fraction was in the range of 45% to 50%. Regional wall motion abnormalities:  There is severe hypokinesis of the septal myocardium. The study was not technically sufficient to allow evaluation of LV diastolic dysfunction due to atrial fibrillation.  ------------------------------------------------------------------- Aortic valve:   Trileaflet; mildly thickened leaflets. Mobility was not restricted.  Doppler:  Transvalvular velocity was within the normal range. There was no stenosis. There was no regurgitation.  ------------------------------------------------------------------- Aorta:  Aortic root: The aortic root was normal in size.  ------------------------------------------------------------------- Mitral valve:   Structurally normal valve.   Mobility was not restricted.  Doppler:  Transvalvular velocity was within the normal range. There was no evidence for stenosis. There was mild regurgitation.  ------------------------------------------------------------------- Left atrium:  The atrium was moderately dilated.  ------------------------------------------------------------------- Right ventricle:  The cavity size was normal. Systolic function was normal.  ------------------------------------------------------------------- Pulmonic valve:    Doppler:  Transvalvular velocity was within the normal range. There was no evidence for stenosis.  ------------------------------------------------------------------- Tricuspid valve:   Structurally normal valve.    Doppler: Transvalvular velocity was within the normal range. There was mild regurgitation.  ------------------------------------------------------------------- Pulmonary artery:   Systolic pressure was within the normal range.  ------------------------------------------------------------------- Right atrium:  The atrium was moderately  dilated.  ------------------------------------------------------------------- Pericardium:  There was no pericardial effusion.  ------------------------------------------------------------------- Systemic veins: Inferior vena cava: The vessel was dilated.  ------------------------------------------------------------------- Measurements  Left ventricle                         Value        Reference LV ID, ED, PLAX chordal        (H)     63    mm     43 - 52  LV ID, ES, PLAX chordal        (H)     48    mm     23 - 38 LV fx shortening, PLAX chordal (L)     24    %      >=29 LV PW thickness, ED                    11    mm     --------- IVS/LV PW ratio, ED                    1            <=1.3 Stroke volume, 2D                      53    ml     --------- Stroke volume/bsa, 2D                  25    ml/m^2 ---------  Ventricular septum                     Value        Reference IVS thickness, ED                      11    mm     ---------  LVOT                                   Value        Reference LVOT ID, S                             20    mm     --------- LVOT area                              3.14  cm^2   --------- LVOT peak velocity, S                  79.8  cm/s   --------- LVOT mean velocity, S                  54.8  cm/s   --------- LVOT VTI, S                            16.9  cm     ---------  Aorta                                  Value        Reference Aortic root ID, ED                     34    mm     --------- Ascending aorta ID, A-P, S             32    mm     ---------  Left atrium                            Value  Reference LA ID, A-P, ES                         46    mm     --------- LA ID/bsa, A-P                         2.15  cm/m^2 <=2.2 LA volume, S                           87.2  ml     --------- LA volume/bsa, S                       40.9  ml/m^2 --------- LA volume, ES, 1-p A4C                 62.2  ml     --------- LA volume/bsa, ES, 1-p A4C              29.1  ml/m^2 --------- LA volume, ES, 1-p A2C                 119   ml     --------- LA volume/bsa, ES, 1-p A2C             55.7  ml/m^2 ---------  Pulmonary arteries                     Value        Reference PA pressure, S, DP             (H)     35    mm Hg  <=30  Tricuspid valve                        Value        Reference Tricuspid regurg peak velocity         259   cm/s   --------- Tricuspid peak RV-RA gradient          27    mm Hg  ---------  Systemic veins                         Value        Reference Estimated CVP                          8     mm Hg  ---------  Right ventricle                        Value        Reference TAPSE                                  16    mm     --------- RV pressure, S, DP             (H)     35    mm Hg  <=30 RV s&', lateral, S                      7.65  cm/s   ---------  Legend: (L)  and  (H)  mark values outside specified reference range.  -------------------------------------------------------------------  Prepared and Electronically Authenticated by  Alexandria Angel 2019-03-08T15:03:30          ______________________________________________________________________________________________      Risk Assessment/Calculations  CHA2DS2-VASc Score = 6   This indicates a 9.7% annual risk of stroke. The patient's score is based upon: CHF History: 0 HTN History: 1 Diabetes History: 1 Stroke History: 0 Vascular Disease History: 1 Age Score: 2 Gender Score: 1            Physical Exam VS:  BP 130/78   Pulse 81   Ht 5\' 8"  (1.727 m)   Wt 197 lb (89.4 kg)   SpO2 96%   BMI 29.95 kg/m    Wt Readings from Last 3 Encounters:  05/03/24 197 lb (89.4 kg)  02/11/24 189 lb 14.4 oz (86.1 kg)  06/09/23 185 lb 8 oz (84.1 kg)    GEN: Well nourished, well developed in no acute distress NECK: No JVD; No carotid bruits CARDIAC: Irregularly irregular, no murmurs, rubs, gallops RESPIRATORY:  Clear to auscultation without rales,  wheezing or rhonchi  ABDOMEN: Soft, non-tender, non-distended EXTREMITIES:  No edema; feet are discolored but warm to the touch.  ASSESSMENT AND PLAN Permanent atrial fibrillation/hypercoagulable state/on digoxin -her CHA2DS2-VASc score is 6, her rate is controlled.  She has apparently not been on anticoagulation secondary to a relatively severe episode epistaxis in the past.  For now, continue aspirin , digoxin  0.0 65 mg every other day, metoprolol  succinate 200 mg daily.  Repeat digoxin  level, CBC, BMET.  PAD-she is currently following with [facility], she thinks she will see them in the next month or so.  Continue Plavix  75 mg daily, continue aspirin  81 mg daily, continue Crestor  10 mg daily.  HFimpEF -NYHA class I-II, she is euvolemic, currently on digoxin , metoprolol  200 mg daily, Demadex 20 mg daily.  Will repeat BMET, CBC.         Dispo: Labs per above, follow-up in 6 months with Dr. Sandee Crook to establish care with him.  Signed, Terrance Ferretti, NP

## 2024-05-03 ENCOUNTER — Ambulatory Visit: Attending: Cardiology | Admitting: Cardiology

## 2024-05-03 ENCOUNTER — Encounter: Payer: Self-pay | Admitting: Cardiology

## 2024-05-03 VITALS — BP 130/78 | HR 81 | Ht 68.0 in | Wt 197.0 lb

## 2024-05-03 DIAGNOSIS — I5021 Acute systolic (congestive) heart failure: Secondary | ICD-10-CM | POA: Diagnosis not present

## 2024-05-03 DIAGNOSIS — I10 Essential (primary) hypertension: Secondary | ICD-10-CM

## 2024-05-03 DIAGNOSIS — I4821 Permanent atrial fibrillation: Secondary | ICD-10-CM

## 2024-05-03 DIAGNOSIS — I739 Peripheral vascular disease, unspecified: Secondary | ICD-10-CM | POA: Diagnosis not present

## 2024-05-03 DIAGNOSIS — D6859 Other primary thrombophilia: Secondary | ICD-10-CM

## 2024-05-03 DIAGNOSIS — I701 Atherosclerosis of renal artery: Secondary | ICD-10-CM | POA: Diagnosis not present

## 2024-05-03 NOTE — Patient Instructions (Signed)
 Medication Instructions:  Your physician recommends that you continue on your current medications as directed. Please refer to the Current Medication list given to you today.  *If you need a refill on your cardiac medications before your next appointment, please call your pharmacy*  Lab Work: Your physician recommends that you return for lab work in:   Labs today: Digoxin  level, CBC, CMP  If you have labs (blood work) drawn today and your tests are completely normal, you will receive your results only by: MyChart Message (if you have MyChart) OR A paper copy in the mail If you have any lab test that is abnormal or we need to change your treatment, we will call you to review the results.  Testing/Procedures: None  Follow-Up: At [facility], you and your health needs are our priority.  As part of our continuing mission to provide you with exceptional heart care, our providers are all part of one team.  This team includes your primary Cardiologist (physician) and Advanced Practice Providers or APPs (Physician Assistants and Nurse Practitioners) who all work together to provide you with the care you need, when you need it.  Your next appointment:   6 month(s)  Provider:   Zoe Hinds, MD    We recommend signing up for the patient portal called "MyChart".  Sign up information is provided on this After Visit Summary.  MyChart is used to connect with patients for Virtual Visits (Telemedicine).  Patients are able to view lab/test results, encounter notes, upcoming appointments, etc.  Non-urgent messages can be sent to your provider as well.   To learn more about what you can do with MyChart, go to [email or web address].   Other Instructions None

## 2024-05-04 ENCOUNTER — Other Ambulatory Visit: Payer: Self-pay | Admitting: Cardiology

## 2024-05-05 ENCOUNTER — Ambulatory Visit: Payer: Self-pay | Admitting: Cardiology

## 2024-05-05 LAB — COMPREHENSIVE METABOLIC PANEL WITH GFR
ALT: 25 IU/L (ref 0–32)
AST: 25 IU/L (ref 0–40)
Albumin: 4.6 g/dL (ref 3.7–4.7)
Alkaline Phosphatase: 45 IU/L (ref 44–121)
BUN/Creatinine Ratio: 19 (ref 12–28)
BUN: 25 mg/dL (ref 8–27)
Bilirubin Total: 0.3 mg/dL (ref 0.0–1.2)
CO2: 19 mmol/L — ABNORMAL LOW (ref 20–29)
Calcium: 10 mg/dL (ref 8.7–10.3)
Chloride: 103 mmol/L (ref 96–106)
Creatinine, Ser: 1.33 mg/dL — ABNORMAL HIGH (ref 0.57–1.00)
Globulin, Total: 1.7 g/dL (ref 1.5–4.5)
Glucose: 136 mg/dL — ABNORMAL HIGH (ref 70–99)
Potassium: 5 mmol/L (ref 3.5–5.2)
Sodium: 142 mmol/L (ref 134–144)
Total Protein: 6.3 g/dL (ref 6.0–8.5)
eGFR: 40 mL/min/1.73 — ABNORMAL LOW

## 2024-05-05 LAB — CBC
Hematocrit: 36.7 % (ref 34.0–46.6)
Hemoglobin: 12.1 g/dL (ref 11.1–15.9)
MCH: 32.3 pg (ref 26.6–33.0)
MCHC: 33 g/dL (ref 31.5–35.7)
MCV: 98 fL — ABNORMAL HIGH (ref 79–97)
Platelets: 317 x10E3/uL (ref 150–450)
RBC: 3.75 x10E6/uL — ABNORMAL LOW (ref 3.77–5.28)
RDW: 14.3 % (ref 11.7–15.4)
WBC: 10.5 x10E3/uL (ref 3.4–10.8)

## 2024-05-05 LAB — DIGOXIN LEVEL: Digoxin, Serum: <0.4 ng/mL — ABNORMAL LOW (ref 0.5–0.9)

## 2024-05-17 DIAGNOSIS — T82898D Other specified complication of vascular prosthetic devices, implants and grafts, subsequent encounter: Secondary | ICD-10-CM | POA: Diagnosis not present

## 2024-05-17 DIAGNOSIS — I129 Hypertensive chronic kidney disease with stage 1 through stage 4 chronic kidney disease, or unspecified chronic kidney disease: Secondary | ICD-10-CM | POA: Diagnosis not present

## 2024-05-17 DIAGNOSIS — Z87891 Personal history of nicotine dependence: Secondary | ICD-10-CM | POA: Diagnosis not present

## 2024-05-17 DIAGNOSIS — Z7982 Long term (current) use of aspirin: Secondary | ICD-10-CM | POA: Diagnosis not present

## 2024-05-17 DIAGNOSIS — N183 Chronic kidney disease, stage 3 unspecified: Secondary | ICD-10-CM | POA: Diagnosis not present

## 2024-05-17 DIAGNOSIS — I739 Peripheral vascular disease, unspecified: Secondary | ICD-10-CM | POA: Diagnosis not present

## 2024-05-17 DIAGNOSIS — Z7902 Long term (current) use of antithrombotics/antiplatelets: Secondary | ICD-10-CM | POA: Diagnosis not present

## 2024-05-17 DIAGNOSIS — Z95820 Peripheral vascular angioplasty status with implants and grafts: Secondary | ICD-10-CM | POA: Diagnosis not present

## 2024-05-17 DIAGNOSIS — I482 Chronic atrial fibrillation, unspecified: Secondary | ICD-10-CM | POA: Diagnosis not present

## 2024-05-17 DIAGNOSIS — E1122 Type 2 diabetes mellitus with diabetic chronic kidney disease: Secondary | ICD-10-CM | POA: Diagnosis not present

## 2024-05-28 ENCOUNTER — Other Ambulatory Visit: Payer: Self-pay | Admitting: Adult Health

## 2024-06-20 DIAGNOSIS — D509 Iron deficiency anemia, unspecified: Secondary | ICD-10-CM | POA: Diagnosis not present

## 2024-06-20 DIAGNOSIS — N184 Chronic kidney disease, stage 4 (severe): Secondary | ICD-10-CM | POA: Diagnosis not present

## 2024-06-20 DIAGNOSIS — E890 Postprocedural hypothyroidism: Secondary | ICD-10-CM | POA: Diagnosis not present

## 2024-06-20 DIAGNOSIS — E782 Mixed hyperlipidemia: Secondary | ICD-10-CM | POA: Diagnosis not present

## 2024-06-20 DIAGNOSIS — K2950 Unspecified chronic gastritis without bleeding: Secondary | ICD-10-CM | POA: Diagnosis not present

## 2024-06-20 DIAGNOSIS — C9110 Chronic lymphocytic leukemia of B-cell type not having achieved remission: Secondary | ICD-10-CM | POA: Diagnosis not present

## 2024-06-20 DIAGNOSIS — M109 Gout, unspecified: Secondary | ICD-10-CM | POA: Diagnosis not present

## 2024-06-20 DIAGNOSIS — E1151 Type 2 diabetes mellitus with diabetic peripheral angiopathy without gangrene: Secondary | ICD-10-CM | POA: Diagnosis not present

## 2024-06-20 DIAGNOSIS — I10 Essential (primary) hypertension: Secondary | ICD-10-CM | POA: Diagnosis not present

## 2024-06-20 DIAGNOSIS — Z79899 Other long term (current) drug therapy: Secondary | ICD-10-CM | POA: Diagnosis not present

## 2024-06-20 DIAGNOSIS — I739 Peripheral vascular disease, unspecified: Secondary | ICD-10-CM | POA: Diagnosis not present

## 2024-06-20 DIAGNOSIS — I4891 Unspecified atrial fibrillation: Secondary | ICD-10-CM | POA: Diagnosis not present

## 2024-06-20 DIAGNOSIS — Z1331 Encounter for screening for depression: Secondary | ICD-10-CM | POA: Diagnosis not present

## 2024-08-01 ENCOUNTER — Other Ambulatory Visit: Payer: Self-pay

## 2024-08-01 DIAGNOSIS — D509 Iron deficiency anemia, unspecified: Secondary | ICD-10-CM

## 2024-08-01 DIAGNOSIS — C9210 Chronic myeloid leukemia, BCR/ABL-positive, not having achieved remission: Secondary | ICD-10-CM

## 2024-08-02 ENCOUNTER — Inpatient Hospital Stay: Attending: Hematology

## 2024-08-02 DIAGNOSIS — C9210 Chronic myeloid leukemia, BCR/ABL-positive, not having achieved remission: Secondary | ICD-10-CM

## 2024-08-02 DIAGNOSIS — D509 Iron deficiency anemia, unspecified: Secondary | ICD-10-CM | POA: Insufficient documentation

## 2024-08-02 DIAGNOSIS — C9211 Chronic myeloid leukemia, BCR/ABL-positive, in remission: Secondary | ICD-10-CM | POA: Diagnosis not present

## 2024-08-02 LAB — CBC WITH DIFFERENTIAL (CANCER CENTER ONLY)
Abs Immature Granulocytes: 0.07 K/uL (ref 0.00–0.07)
Basophils Absolute: 0.1 K/uL (ref 0.0–0.1)
Basophils Relative: 1 %
Eosinophils Absolute: 0.5 K/uL (ref 0.0–0.5)
Eosinophils Relative: 5 %
HCT: 38.1 % (ref 36.0–46.0)
Hemoglobin: 12.3 g/dL (ref 12.0–15.0)
Immature Granulocytes: 1 %
Lymphocytes Relative: 27 %
Lymphs Abs: 2.9 K/uL (ref 0.7–4.0)
MCH: 31 pg (ref 26.0–34.0)
MCHC: 32.3 g/dL (ref 30.0–36.0)
MCV: 96 fL (ref 80.0–100.0)
Monocytes Absolute: 0.8 K/uL (ref 0.1–1.0)
Monocytes Relative: 7 %
Neutro Abs: 6.7 K/uL (ref 1.7–7.7)
Neutrophils Relative %: 59 %
Platelet Count: 294 K/uL (ref 150–400)
RBC: 3.97 MIL/uL (ref 3.87–5.11)
RDW: 14.8 % (ref 11.5–15.5)
WBC Count: 11.1 K/uL — ABNORMAL HIGH (ref 4.0–10.5)
nRBC: 0 % (ref 0.0–0.2)

## 2024-08-02 LAB — IRON AND IRON BINDING CAPACITY (CC-WL,HP ONLY)
Iron: 38 ug/dL (ref 28–170)
Saturation Ratios: 9 % — ABNORMAL LOW (ref 10.4–31.8)
TIBC: 421 ug/dL (ref 250–450)
UIBC: 383 ug/dL (ref 148–442)

## 2024-08-02 LAB — CMP (CANCER CENTER ONLY)
ALT: 29 U/L (ref 0–44)
AST: 25 U/L (ref 15–41)
Albumin: 4.5 g/dL (ref 3.5–5.0)
Alkaline Phosphatase: 34 U/L — ABNORMAL LOW (ref 38–126)
Anion gap: 7 (ref 5–15)
BUN: 31 mg/dL — ABNORMAL HIGH (ref 8–23)
CO2: 30 mmol/L (ref 22–32)
Calcium: 9.9 mg/dL (ref 8.9–10.3)
Chloride: 104 mmol/L (ref 98–111)
Creatinine: 1.47 mg/dL — ABNORMAL HIGH (ref 0.44–1.00)
GFR, Estimated: 35 mL/min — ABNORMAL LOW (ref >60)
Glucose, Bld: 93 mg/dL (ref 70–99)
Potassium: 4.6 mmol/L (ref 3.5–5.1)
Sodium: 141 mmol/L (ref 135–145)
Total Bilirubin: 0.5 mg/dL (ref 0.0–1.2)
Total Protein: 6.9 g/dL (ref 6.5–8.1)

## 2024-08-02 LAB — FERRITIN: Ferritin: 45 ng/mL (ref 11–307)

## 2024-08-06 LAB — BCR-ABL1, CML/ALL, PCR, QUANT
E1A2 Transcript: <0.0032 %
Interpretation (BCRAL):: NEGATIVE
b2a2 transcript: <0.0032 %
b3a2 transcript: <0.0032 %

## 2024-08-18 ENCOUNTER — Ambulatory Visit: Admitting: Hematology

## 2024-08-25 ENCOUNTER — Ambulatory Visit: Admitting: Hematology

## 2024-08-30 ENCOUNTER — Inpatient Hospital Stay: Attending: Hematology | Admitting: Hematology

## 2024-08-30 DIAGNOSIS — D509 Iron deficiency anemia, unspecified: Secondary | ICD-10-CM

## 2024-08-30 DIAGNOSIS — C9210 Chronic myeloid leukemia, BCR/ABL-positive, not having achieved remission: Secondary | ICD-10-CM | POA: Diagnosis not present

## 2024-08-30 DIAGNOSIS — C9211 Chronic myeloid leukemia, BCR/ABL-positive, in remission: Secondary | ICD-10-CM | POA: Insufficient documentation

## 2024-08-30 MED ORDER — POLYSACCHARIDE IRON COMPLEX 150 MG PO CAPS: 150.0000 mg | ORAL_CAPSULE | Freq: Every day | ORAL | 5 refills | Status: AC

## 2024-08-30 NOTE — Progress Notes (Signed)
 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   CML was diagnosed in 2013. She presented with chronic phase and achieved with logical molecular remission.   Current treatment: Tasigna  300 mg bid started 11/2012 with complete hematological and molecular remission.  Therapy has been on hold due to health concerns in April 2022.   HISTORY OF PRESENTING ILLNESS:   Sara Warner is a wonderful 83 y.o. female who has been a previous patient of Dr. Amadeo. 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   Patient was last seen by Dr. Amadeo on 12/11/2022 and was doing well overall. She noted that her wound was slowly healing. Patient had procedure for peripheral vascular disease scheduled on December 25 2022.   Today, she presents in a wheelchair and is accompanied by her daughter. Patient was on Tasigna  between early 2014-2022. Patient does not take Tasigna  at this time, but notes that she did tolerate it well previously with no toxicity issues.  Patient was seen by vascular surgery for ulcer in her left heal. She reports that her arteries are healing and continues to be managed by wound care. Patient does endorse DM which is generally well controlled. She denies any fequent cramping in her lower extremities.   She reports that her kidney function fluctuates due to narrowing blood vessel in the kidneys. Patient had a stent tent placed which improved kidney function.  She does function independently at home and uses a walker regularly. She denies any major medication changes in the last 6 months.   She denies any recent infections, new bone pains, abdominal pain/distention, significant unexplained fever, chills, night sweats, sudden change in  energy levels, or leg swelling.   INTERVAL HISTORY:  .SABRAI connected with Leeroy People on 08/30/2024 at  3:30 PM EDT by telephone visit and verified that I am speaking with the correct person using two identifiers.   I discussed the limitations, risks, security and privacy concerns of performing an evaluation and management service by telemedicine and the availability of in-person appointments. I also discussed with the patient that there may be a patient responsible charge related to this service. The patient expressed understanding and agreed to proceed.   Other persons participating in the visit and their role in the encounter: Medical scribe, Patient, Dr Onesimo   Patient's location: home  Provider's location: [facility]   Chief Complaint: Follow-up for [facility]  Patient was called for continued evaluation and management of CML. Patient notes no new symptoms since her last clinic visit. No fevers no chills no night sweats no new bone pains. Labs done on 08/02/2024 were reviewed with her in details. Continues to remain off to [employer] since April 2022.  MEDICAL HISTORY:  Past Medical History:  Diagnosis Date   Atrial fibrillation (HCC)    Chronic atrial fibrillation (HCC)    a. Refuses OAC.  CHA2DS2VASc = 4.   Chronic systolic CHF (congestive heart failure) (HCC)    a. 05/2015 Echo: EF 35-40%.   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 06/2013 MV: low risk study w/o ischemia;  b. 05/2015 Echo: EF 35-40%, diff HK, basal-midinferoseptal and basal-midanteroseptal AK. Mild-mod MR, mod dil LA, mild to mod TR, PASP .   PAD (peripheral artery disease)    Pneumonia    30 yrs. ago   Renal artery stenosis    s/p left  renal artery stent 02/18/21    SURGICAL HISTORY: Past Surgical History:  Procedure Laterality Date   ABDOMINAL AORTOGRAM W/LOWER EXTREMITY Bilateral 12/06/2019   Procedure: ABDOMINAL AORTOGRAM W/LOWER EXTREMITY;  Surgeon: Gretta Lonni PARAS, MD;  Location: [facility];  Service: Cardiovascular;  Laterality: Bilateral;   ABDOMINAL AORTOGRAM W/LOWER EXTREMITY Bilateral 05/15/2020   Procedure: ABDOMINAL AORTOGRAM W/LOWER EXTREMITY;  Surgeon: Gretta Lonni PARAS, MD;  Location: [facility];  Service: Cardiovascular;  Laterality: Bilateral;   ABDOMINAL AORTOGRAM W/LOWER EXTREMITY N/A 02/18/2021   Procedure: ABDOMINAL AORTOGRAM W/LOWER EXTREMITY;  Surgeon: Serene Gaile ORN, MD;  Location: [facility];  Service: Cardiovascular;  Laterality: N/A;   ABDOMINAL HYSTERECTOMY     APPLICATION OF WOUND VAC  01/10/2020   BLEEDING FROM FEMORAL BYPASS   BIOPSY  04/08/2020   Procedure: BIOPSY;  Surgeon: Charlanne Groom, MD;  Location: [facility] ENDOSCOPY;  Service: Endoscopy;;   CARDIOVERSION     COLONOSCOPY WITH PROPOFOL  N/A 04/08/2020   Procedure: COLONOSCOPY WITH PROPOFOL ;  Surgeon: Charlanne Groom, MD;  Location: [facility] ENDOSCOPY;  Service: Endoscopy;  Laterality: N/A;   CYST EXCISION     removed on right ovary   CYSTECTOMY     ENDARTERECTOMY FEMORAL Left 06/20/2020   Procedure: LEFT COMMON FEMORAL ENDARTERECTOMY LEFT ABOVE KNEE POPLITEAL ENDARTERECTOMY;  Surgeon: Gretta Lonni PARAS, MD;  Location: [facility] OR;  Service: Vascular;  Laterality: Left;   ENDARTERECTOMY FEMORAL Left 02/24/2021   Procedure: REDO LEFT COMMON FEMORAL ENDARTERECTOMY AND PROFUNDAPLASTY WTH BOVINE PATCH;  Surgeon: Gretta Lonni PARAS, MD;  Location: [facility] OR;  Service: Vascular;  Laterality: Left;   ENTEROSCOPY N/A 03/13/2021   Procedure: ENTEROSCOPY;  Surgeon: Albertus Gordy HERO, MD;  Location: [facility] ENDOSCOPY;  Service: Gastroenterology;  Laterality: N/A;   ESOPHAGOGASTRODUODENOSCOPY (EGD) WITH PROPOFOL  N/A 04/08/2020   Procedure:  ESOPHAGOGASTRODUODENOSCOPY (EGD) WITH PROPOFOL ;  Surgeon: Charlanne Groom, MD;  Location: [facility] ENDOSCOPY;  Service: Endoscopy;  Laterality: N/A;   ESOPHAGOGASTRODUODENOSCOPY (EGD) WITH PROPOFOL  N/A 03/11/2021   Procedure: ESOPHAGOGASTRODUODENOSCOPY (EGD) WITH PROPOFOL ;  Surgeon: Albertus Gordy HERO, MD;  Location: [facility] ENDOSCOPY;  Service: Gastroenterology;  Laterality: N/A;   FEMORAL ARTERY - POPLITEAL ARTERY BYPASS GRAFT  12/15/2019   Right common femoral artery to below-knee popliteal artery bypass with ipsilateral nonreversed great saphenous vein   FEMORAL-POPLITEAL BYPASS GRAFT Right 12/15/2019   Procedure: BYPASS GRAFT FEMORAL-POPLITEAL ARTERY;  Surgeon: Gretta Lonni PARAS, MD;  Location: [facility] OR;  Service: Vascular;  Laterality: Right;   FEMORAL-POPLITEAL BYPASS GRAFT Left 06/20/2020   Procedure: BYPASS GRAFT FEMORAL TO ABOVE KNEE POPLITEAL ARTERY WITH HARVENSTED NON REVERSED GREATER SAPHENOUS VEIN;  Surgeon: Gretta Lonni PARAS, MD;  Location: [facility] OR;  Service: Vascular;  Laterality: Left;   FEMORAL-POPLITEAL BYPASS GRAFT Left 02/24/2021   Procedure: REDO LEFT COMMON FEMORAL ARTERY EXPOSURE AND LEFT COMMON FEMORAL ARTERY TO BELOW KNEE POPLITEAL ARTERY BYPASS GRAFT WITH PROPATEN VASCULAR GRAFT;  Surgeon: Gretta Lonni PARAS, MD;  Location: [facility] OR;  Service: Vascular;  Laterality: Left;   GIVENS CAPSULE STUDY N/A 03/11/2021   Procedure: GIVENS CAPSULE STUDY;  Surgeon: Albertus Gordy HERO,  MD;  Location: [facility] ENDOSCOPY;  Service: Gastroenterology;  Laterality: N/A;   GIVENS CAPSULE STUDY N/A 03/13/2021   Procedure: GIVENS CAPSULE STUDY;  Surgeon: Albertus Gordy HERO, MD;  Location: [facility] ENDOSCOPY;  Service: Gastroenterology;  Laterality: N/A;   HOT HEMOSTASIS N/A 03/11/2021   Procedure: HOT HEMOSTASIS (ARGON PLASMA COAGULATION/BICAP);  Surgeon: Albertus Gordy HERO, MD;  Location: [facility] ENDOSCOPY;  Service: Gastroenterology;  Laterality: N/A;   HYSTEROTOMY     PERIPHERAL VASCULAR INTERVENTION Right 12/06/2019   Procedure: PERIPHERAL  VASCULAR INTERVENTION;  Surgeon: Gretta Lonni PARAS, MD;  [facility];  Service: Cardiovascular;  Laterality: Right;  EXT ILIAC   PERIPHERAL VASCULAR INTERVENTION Left 02/18/2021   Procedure: PERIPHERAL VASCULAR INTERVENTION;  Surgeon: Serene Gaile ORN, MD;  [facility]: [facility];  Service: Cardiovascular;  Laterality: Left;  renal artery   POLYPECTOMY  04/08/2020   Procedure: POLYPECTOMY;  Surgeon: Charlanne Groom, MD;  Location: [facility] ENDOSCOPY;  Service: Endoscopy;;   WOUND EXPLORATION Right 01/10/2020   Procedure: right lower leg Wound Exploration with wound vac application;  Surgeon: Serene Gaile ORN, MD;  Location: [facility] OR;  Service: Vascular;  Laterality: Right;    SOCIAL HISTORY: Social History   Socioeconomic History   Marital status: Single    Spouse name: Not on file   Number of children: Not on file   Years of education: Not on file   Highest education level: Not on file  Occupational History   Not on file  Tobacco Use   Smoking status: Former    Current packs/day: 0.00    Types: Cigarettes    Quit date: 01/01/1984    Years since quitting: 40.6   Smokeless tobacco: Never  Vaping Use   Vaping status: Never Used  Substance and Sexual Activity   Alcohol use: No   Drug use: Never   Sexual activity: Not on file  Other Topics Concern   Not on file  Social History Narrative   Not on file   Social Drivers of Health   Financial Resource Strain: Not on file  Food Insecurity: Low Risk  (05/17/2024)   Received from [facility]   Hunger Vital Sign    Within the past 12 months, you worried that your food would run out before you got money to buy more: Never true    Within the past 12 months, the food you bought just didn't last and you didn't have money to get more. : Never true  Transportation Needs: No Transportation Needs (05/17/2024)   Received from [facility]    In the past 12 months, has lack of reliable transportation kept you  from medical appointments, meetings, work or from getting things needed for daily living? : No  Physical Activity: Not on file  Stress: Not on file  Social Connections: Not on file  Intimate Partner Violence: Low Risk  (12/25/2022)   Received from [facility] visits prior to 01/30/2023.   Safety    How often does anyone, including family and friends, physically hurt you?: Never    How often does anyone, including family and friends, insult or talk down to you?: Never    How often does anyone, including family and friends, threaten you with harm?: Never    How often does anyone, including family and friends, scream or curse at you?: Never    FAMILY HISTORY: Family History  Problem Relation Age of Onset   Cancer - Colon Mother  mast to lungs   Diabetes Father    Diabetes Paternal Grandmother     ALLERGIES:  is allergic to lisinopril , losartan , and gabapentin.  MEDICATIONS:  Current Outpatient Medications  Medication Sig Dispense Refill   allopurinol (ZYLOPRIM) 100 MG tablet Take 100 mg by mouth daily.     aspirin  EC 81 MG tablet Take 81 mg by mouth daily.     cetirizine (ZYRTEC) 10 MG tablet Take 10 mg by mouth daily as needed for allergies.     clopidogrel  (PLAVIX ) 75 MG tablet Take 1 tablet by mouth daily.     clorazepate  (TRANXENE -T) 7.5 MG tablet Take 1 tablet (7.5 mg total) by mouth 2 (two) times daily as needed for anxiety. 30 tablet 1   digoxin  (LANOXIN ) 0.125 MG tablet Take 0.5 tablets (0.0625 mg total) by mouth every other day. 23 tablet 0   Dulaglutide  (TRULICITY ) 4.5 MG/0.5ML SOPN Inject 4.5 mg into the skin once a week.     glipiZIDE  (GLUCOTROL  XL) 10 MG 24 hr tablet Take 10 mg by mouth 2 (two) times daily.      iron  polysaccharides (NIFEREX) 150 MG capsule Take 1 capsule (150 mg total) by mouth daily. 30 capsule 5   metFORMIN (GLUCOPHAGE) 500 MG tablet Take 2,000 mg by mouth 2 (two) times daily with a meal.     metoprolol  (TOPROL -XL) 200 MG 24  hr tablet TAKE 1 TABLET BY MOUTH EVERY DAY 90 tablet 3   rosuvastatin  (CRESTOR ) 10 MG tablet TAKE 1 TABLET (10 MG TOTAL) BY MOUTH DAILY. PT MUST KEEP 6-4 APPT FOR FURTHER REFILLS 90 tablet 3   SYNTHROID  100 MCG tablet Take 100 mcg by mouth daily.     torsemide (DEMADEX) 20 MG tablet Take 20 mg by mouth daily. Take 1 Tablet Daily.     No current facility-administered medications for this visit.    REVIEW OF SYSTEMS:    .10 Point review of Systems was done is negative except as noted above.  PHYSICAL EXAMINATION: TELEMEDICINE VISIT Telemedicine visit   LABORATORY DATA:  I have reviewed the data as listed  .    Latest Ref Rng & Units 08/02/2024   12:57 PM 05/03/2024   12:10 PM 02/02/2024   12:32 PM  CBC  WBC 4.0 - 10.5 K/uL 11.1  10.5  9.7   Hemoglobin 12.0 - 15.0 g/dL 87.6  87.8  87.4   Hematocrit 36.0 - 46.0 % 38.1  36.7  38.4   Platelets 150 - 400 K/uL 294  317  321       Latest Ref Rng & Units 08/02/2024   12:57 PM 05/03/2024   12:10 PM 02/02/2024   12:32 PM  CMP  Glucose 70 - 99 mg/dL 93  863  779   BUN 8 - 23 mg/dL 31  25  30    Creatinine 0.44 - 1.00 mg/dL 8.52  8.66  8.61   Sodium 135 - 145 mmol/L 141  142  139   Potassium 3.5 - 5.1 mmol/L 4.6  5.0  4.0   Chloride 98 - 111 mmol/L 104  103  106   CO2 22 - 32 mmol/L 30  19  25    Calcium  8.9 - 10.3 mg/dL 9.9  89.9  9.5   Total Protein 6.5 - 8.1 g/dL 6.9  6.3  6.7   Total Bilirubin 0.0 - 1.2 mg/dL 0.5  0.3  0.4   Alkaline Phos 38 - 126 U/L 34  45  44   AST 15 -  41 U/L 25  25  22    ALT 0 - 44 U/L 29  25  24     RADIOGRAPHIC STUDIES: I have personally reviewed the radiological images as listed and agreed with the findings in the report. No results found.  ASSESSMENT & PLAN:   83 y.o. female with:  1.  Chronic phase CML diagnosed in 2013.  She achieved molecular and hematological remission but off treatment due to vascular disease and health issues.   2. Iron  deficiency anemia  3. Peripheral vascular disease, continues  to follow with vascular surgery.   PLAN: -Discussed lab results from 08/04/2024 in detail with the patient. -CBC shows normal hemoglobin of 12.3 normal platelet count of 294k and WBC count of 11.1 with normal differential CMP is stable with stable chronic kidney disease with a creatinine of 1.47 Ferritin is 45 with an iron  saturation of 9% BCR-ABL PCR continues to show deep molecular response 4.5 She continues to stay off Tasigna  which she last took in April 2022 Would recommend patient continue taking iron  polysaccharide 150 mg p.o. daily to keep optimizing her iron  stores.  FOLLOW-UP:  Labs in 22 weeks Phone visit with Dr Onesimo in 24 weeks   The total time spent in the appointment was 20 minutes*.  All of the patient's questions were answered with apparent satisfaction. The patient knows to call the clinic with any problems, questions or concerns.   Emaline Onesimo MD MS AAHIVMS [facility] Hematology/Oncology Physician [facility]  .*Total Encounter Time as defined by the Centers for Medicare and Medicaid Services includes, in addition to the face-to-face time of a patient visit (documented in the note above) non-face-to-face time: obtaining and reviewing outside history, ordering and reviewing medications, tests or procedures, care coordination (communications with other health care professionals or caregivers) and documentation in the medical record.

## 2024-08-31 ENCOUNTER — Telehealth: Payer: Self-pay | Admitting: Hematology

## 2024-08-31 NOTE — Telephone Encounter (Signed)
 Sara Warner has been contacted and made aware of her follow up appointments for 22 weeks.

## 2024-09-07 ENCOUNTER — Encounter: Payer: Self-pay | Admitting: Hematology

## 2024-12-01 NOTE — Progress Notes (Signed)
 Sara Warner                                          MRN: [id number]   12/01/2024   The VBCI Quality Team Specialist reviewed this patient medical record for the purposes of chart review for care gap closure. The following were reviewed: chart review for care gap closure-kidney health evaluation for diabetes:eGFR  and uACR.    VBCI Quality Team

## 2025-01-31 ENCOUNTER — Other Ambulatory Visit

## 2025-02-07 ENCOUNTER — Telehealth: Admitting: Hematology
